# Patient Record
Sex: Male | Born: 1935 | Race: White | Hispanic: No | Marital: Married | Smoking: Former smoker
Health system: Southern US, Community
[De-identification: ages and names within clinical notes are randomized; demographics above are authoritative.]

## PROBLEM LIST (undated history)

## (undated) DIAGNOSIS — F419 Anxiety disorder, unspecified: Secondary | ICD-10-CM

## (undated) DIAGNOSIS — I25810 Atherosclerosis of coronary artery bypass graft(s) without angina pectoris: Secondary | ICD-10-CM

## (undated) DIAGNOSIS — I10 Essential (primary) hypertension: Secondary | ICD-10-CM

## (undated) DIAGNOSIS — K219 Gastro-esophageal reflux disease without esophagitis: Secondary | ICD-10-CM

## (undated) DIAGNOSIS — I4891 Unspecified atrial fibrillation: Secondary | ICD-10-CM

## (undated) DIAGNOSIS — E785 Hyperlipidemia, unspecified: Secondary | ICD-10-CM

## (undated) DIAGNOSIS — A0472 Enterocolitis due to Clostridium difficile, not specified as recurrent: Secondary | ICD-10-CM

## (undated) DIAGNOSIS — I6529 Occlusion and stenosis of unspecified carotid artery: Secondary | ICD-10-CM

## (undated) DIAGNOSIS — C44629 Squamous cell carcinoma of skin of left upper limb, including shoulder: Secondary | ICD-10-CM

## (undated) HISTORY — DX: Enterocolitis due to Clostridium difficile, not specified as recurrent: A04.72

## (undated) HISTORY — PX: SKIN CANCER EXCISION: SHX779

## (undated) HISTORY — DX: Gastro-esophageal reflux disease without esophagitis: K21.9

## (undated) HISTORY — DX: Hyperlipidemia, unspecified: E78.5

## (undated) HISTORY — PX: EYE SURGERY: SHX253

## (undated) HISTORY — DX: Occlusion and stenosis of unspecified carotid artery: I65.29

## (undated) HISTORY — DX: Anxiety disorder, unspecified: F41.9

## (undated) HISTORY — DX: Unspecified atrial fibrillation: I48.91

## (undated) HISTORY — PX: CORONARY ARTERY BYPASS GRAFT: SHX141

## (undated) HISTORY — DX: Essential (primary) hypertension: I10

## (undated) HISTORY — PX: CAROTID-SUBCLAVIAN BYPASS GRAFT: SHX910

---
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
## 2001-01-24 HISTORY — PX: CAROTID ENDARTERECTOMY: SUR193

## 2001-07-25 ENCOUNTER — Encounter: Payer: Self-pay | Admitting: Thoracic Surgery (Cardiothoracic Vascular Surgery)

## 2001-07-26 ENCOUNTER — Inpatient Hospital Stay (HOSPITAL_COMMUNITY): Admission: AD | Admit: 2001-07-26 | Discharge: 2001-07-31 | Payer: Self-pay | Admitting: Cardiovascular Disease

## 2001-07-26 ENCOUNTER — Encounter: Payer: Self-pay | Admitting: Thoracic Surgery (Cardiothoracic Vascular Surgery)

## 2001-07-27 ENCOUNTER — Encounter: Payer: Self-pay | Admitting: Thoracic Surgery (Cardiothoracic Vascular Surgery)

## 2001-07-28 ENCOUNTER — Encounter: Payer: Self-pay | Admitting: Thoracic Surgery (Cardiothoracic Vascular Surgery)

## 2001-10-25 ENCOUNTER — Ambulatory Visit (HOSPITAL_COMMUNITY): Admission: RE | Admit: 2001-10-25 | Discharge: 2001-10-25 | Payer: Self-pay | Admitting: Cardiovascular Disease

## 2002-01-29 ENCOUNTER — Encounter: Payer: Self-pay | Admitting: *Deleted

## 2002-01-31 ENCOUNTER — Ambulatory Visit (HOSPITAL_COMMUNITY): Admission: RE | Admit: 2002-01-31 | Discharge: 2002-01-31 | Payer: Self-pay | Admitting: *Deleted

## 2003-01-25 HISTORY — PX: COLONOSCOPY: SHX174

## 2003-01-25 LAB — HM COLONOSCOPY

## 2003-06-17 ENCOUNTER — Encounter (INDEPENDENT_AMBULATORY_CARE_PROVIDER_SITE_OTHER): Payer: Self-pay | Admitting: Specialist

## 2003-06-17 ENCOUNTER — Ambulatory Visit (HOSPITAL_COMMUNITY): Admission: RE | Admit: 2003-06-17 | Discharge: 2003-06-17 | Payer: Self-pay | Admitting: Gastroenterology

## 2005-04-11 ENCOUNTER — Ambulatory Visit: Payer: Self-pay | Admitting: Cardiovascular Disease

## 2006-06-05 ENCOUNTER — Ambulatory Visit: Payer: Self-pay | Admitting: *Deleted

## 2006-07-21 ENCOUNTER — Ambulatory Visit: Payer: Self-pay | Admitting: Cardiovascular Disease

## 2006-08-01 ENCOUNTER — Ambulatory Visit: Payer: Self-pay | Admitting: Cardiovascular Disease

## 2006-08-01 ENCOUNTER — Encounter (HOSPITAL_COMMUNITY): Admission: RE | Admit: 2006-08-01 | Discharge: 2006-08-31 | Payer: Self-pay | Admitting: Cardiovascular Disease

## 2006-12-04 ENCOUNTER — Ambulatory Visit: Payer: Self-pay | Admitting: *Deleted

## 2007-02-14 ENCOUNTER — Ambulatory Visit: Payer: Self-pay | Admitting: Cardiovascular Disease

## 2007-06-07 ENCOUNTER — Ambulatory Visit: Payer: Self-pay | Admitting: *Deleted

## 2007-07-05 ENCOUNTER — Ambulatory Visit: Payer: Self-pay | Admitting: *Deleted

## 2007-07-19 ENCOUNTER — Encounter: Admission: RE | Admit: 2007-07-19 | Discharge: 2007-07-19 | Payer: Self-pay | Admitting: *Deleted

## 2007-07-19 ENCOUNTER — Ambulatory Visit: Payer: Self-pay | Admitting: *Deleted

## 2007-07-24 ENCOUNTER — Ambulatory Visit: Payer: Self-pay | Admitting: *Deleted

## 2007-07-24 ENCOUNTER — Ambulatory Visit (HOSPITAL_COMMUNITY): Admission: RE | Admit: 2007-07-24 | Discharge: 2007-07-24 | Payer: Self-pay | Admitting: *Deleted

## 2007-08-21 ENCOUNTER — Encounter (INDEPENDENT_AMBULATORY_CARE_PROVIDER_SITE_OTHER): Payer: Self-pay | Admitting: *Deleted

## 2007-08-21 ENCOUNTER — Inpatient Hospital Stay (HOSPITAL_COMMUNITY): Admission: RE | Admit: 2007-08-21 | Discharge: 2007-08-22 | Payer: Self-pay | Admitting: *Deleted

## 2007-08-21 ENCOUNTER — Ambulatory Visit: Payer: Self-pay | Admitting: *Deleted

## 2007-09-20 ENCOUNTER — Ambulatory Visit: Payer: Self-pay | Admitting: *Deleted

## 2007-10-25 ENCOUNTER — Ambulatory Visit: Payer: Self-pay | Admitting: *Deleted

## 2007-11-26 ENCOUNTER — Inpatient Hospital Stay (HOSPITAL_COMMUNITY): Admission: RE | Admit: 2007-11-26 | Discharge: 2007-11-27 | Payer: Self-pay | Admitting: *Deleted

## 2007-11-26 ENCOUNTER — Ambulatory Visit: Payer: Self-pay | Admitting: *Deleted

## 2007-12-13 ENCOUNTER — Ambulatory Visit: Payer: Self-pay | Admitting: *Deleted

## 2008-01-11 ENCOUNTER — Encounter: Admission: RE | Admit: 2008-01-11 | Discharge: 2008-01-11 | Payer: Self-pay | Admitting: Family Medicine

## 2008-01-16 ENCOUNTER — Ambulatory Visit: Payer: Self-pay | Admitting: Cardiovascular Disease

## 2008-05-19 DIAGNOSIS — I25810 Atherosclerosis of coronary artery bypass graft(s) without angina pectoris: Secondary | ICD-10-CM

## 2008-05-19 DIAGNOSIS — I10 Essential (primary) hypertension: Secondary | ICD-10-CM | POA: Insufficient documentation

## 2008-05-19 DIAGNOSIS — E7800 Pure hypercholesterolemia, unspecified: Secondary | ICD-10-CM | POA: Insufficient documentation

## 2008-05-19 DIAGNOSIS — H538 Other visual disturbances: Secondary | ICD-10-CM

## 2008-05-19 DIAGNOSIS — G458 Other transient cerebral ischemic attacks and related syndromes: Secondary | ICD-10-CM | POA: Insufficient documentation

## 2008-05-19 DIAGNOSIS — G909 Disorder of the autonomic nervous system, unspecified: Secondary | ICD-10-CM | POA: Insufficient documentation

## 2008-05-19 HISTORY — DX: Atherosclerosis of coronary artery bypass graft(s) without angina pectoris: I25.810

## 2008-06-12 ENCOUNTER — Ambulatory Visit: Payer: Self-pay | Admitting: *Deleted

## 2008-06-25 ENCOUNTER — Ambulatory Visit (HOSPITAL_COMMUNITY): Admission: RE | Admit: 2008-06-25 | Discharge: 2008-06-25 | Payer: Self-pay | Admitting: *Deleted

## 2008-06-25 ENCOUNTER — Ambulatory Visit: Payer: Self-pay | Admitting: *Deleted

## 2008-07-17 ENCOUNTER — Ambulatory Visit: Payer: Self-pay | Admitting: *Deleted

## 2008-08-28 ENCOUNTER — Encounter: Payer: Self-pay | Admitting: Cardiovascular Disease

## 2009-04-28 ENCOUNTER — Ambulatory Visit: Payer: Self-pay | Admitting: Vascular Surgery

## 2009-05-26 ENCOUNTER — Encounter: Payer: Self-pay | Admitting: Cardiovascular Disease

## 2009-10-07 ENCOUNTER — Encounter: Payer: Self-pay | Admitting: Cardiovascular Disease

## 2010-01-06 ENCOUNTER — Ambulatory Visit: Payer: Self-pay | Admitting: Cardiovascular Disease

## 2010-01-06 DIAGNOSIS — R0989 Other specified symptoms and signs involving the circulatory and respiratory systems: Secondary | ICD-10-CM

## 2010-01-08 ENCOUNTER — Encounter: Payer: Self-pay | Admitting: Cardiology

## 2010-01-08 ENCOUNTER — Ambulatory Visit: Payer: Self-pay

## 2010-01-08 ENCOUNTER — Encounter: Payer: Self-pay | Admitting: Cardiovascular Disease

## 2010-01-08 ENCOUNTER — Ambulatory Visit: Payer: Self-pay | Admitting: Vascular Surgery

## 2010-02-02 ENCOUNTER — Encounter: Payer: Self-pay | Admitting: Cardiovascular Disease

## 2010-02-03 ENCOUNTER — Encounter: Payer: Self-pay | Admitting: Cardiovascular Disease

## 2010-02-24 ENCOUNTER — Other Ambulatory Visit: Payer: Self-pay | Admitting: Family Medicine

## 2010-02-25 ENCOUNTER — Other Ambulatory Visit (HOSPITAL_COMMUNITY): Payer: Self-pay

## 2010-02-25 NOTE — Assessment & Plan Note (Signed)
Summary: F1Y   Visit Type:  Follow-up  CC:  1 yr fu .  History of Present Illness: Danny [name] is seen today for distant CABG, 2003  carotid disease with left CEA and right subclavian bypass.  in 2009  He has HTN and elevated lipids.  last visit he had had a syncopal episode.  There was no cardiac etiology found.  He has great exercise tolerance with no angina.  He has improved strength in his right arm and no TIA symptoms.  He has residual right carotid disease.  He was scheduled to have a duplex at [facility] Friday and F/U with a new Dr Imogene Burn.  I told him I prefer to follow his carotid disease both exam and duplex in our office since Dr Madilyn Fireman is gone.  I personally called and spole with "Revonda Standard" to cancel his appt.  CRF's are well modified.  Reviewed lipids from [place] and they are excellent on statin.  Discussed low chol/carb/Na diet.   Compliant with meds  Current Problems (verified): 1)  Subclavian Steal Syndrome  (ICD-435.2) 2)  Blurred Vision  (ICD-368.8) 3)  Cad, Artery Bypass Graft  (ICD-414.04) 4)  Syncope-carotid Sinus  (ICD-337) 5)  Hypertension, Unspecified  (ICD-401.9) 6)  Hypercholesterolemia Iia  (ICD-272.0)  Current Medications (verified): 1)  Lopressor 50 Mg Tabs (Metoprolol Tartrate) .... Take 1/2 Tab Two Times A Day 2)  Lovastatin 40 Mg Tabs (Lovastatin) .... Take 1 Tab Daily 3)  Niacin Cr 1000 Mg Cr-Tabs (Niacin) .... Take 1 Tab Daily 4)  Diovan 80 Mg Tabs (Valsartan) .... Take 1 Tab Dialy 5)  Aspirin 325 Mg Tabs (Aspirin) .... Take 1 Tab Daily 6)  Ra Fish Oil 1000 Mg Caps (Omega-3 Fatty Acids) .... Take 1 Tab Daily 7)  Vitamin D 2000 Unit Tabs (Cholecalciferol) .... Take 1 Tab Daily 8)  Daily-Vitamin  Tabs (Multiple Vitamin) .... Take 1 Tab Dialy  Allergies (verified): No Known Drug Allergies  Past History:  Past Medical History: Last updated: 05/19/2008 CAD, ARTERY BYPASS GRAFT (ICD-414.04) SYNCOPE-CAROTID SINUS (ICD-337) HYPERTENSION, UNSPECIFIED  (ICD-401.9) HYPERCHOLESTEROLEMIA  IIA (ICD-272.0)    Past Surgical History: Last updated: 05/19/2008 Rght subclavian bypass 11/26/2007 Madilyn Fireman left carotid endarterectomy 08/19/2007 Madilyn Fireman coronary artery bypass:  [id number] [facility]   Median sternotomy for coronary artery bypass grafting x3 (left internal mammary artery to distal left anterior descending coronary artery, right internal mammary artery to ramus intermediate branch, saphenous vein graft to third circumflex marginal branch).  Family History: Last updated: 05/19/2008 non-contributory  Social History: Last updated: 05/19/2008 Retired  Part Time  Married  Tobacco Use - No.  Alcohol Use - no  Review of Systems       Denies fever, malais, weight loss, blurry vision, decreased visual acuity, cough, sputum, SOB, hemoptysis, pleuritic pain, palpitaitons, heartburn, abdominal pain, melena, lower extremity edema, claudication, or rash. '  Vital Signs:  Patient profile:   77 year old male Height:      74 inches Weight:      177 pounds BMI:     22.81 O2 Sat:      96 % on Room air Pulse rate:   61 / minute BP sitting:   156 / 76  (right arm)  Vitals Entered By: Dreama Saa, CNA (January 06, 2010 9:52 AM)  O2 Flow:  Room air  Physical Exam  General:  Affect appropriate Healthy:  appears stated age HEENT: normal Neck supple with no adenopathy JVP normal bilateral  bruits no thyromegaly Lungs clear with no wheezing  and good diaphragmatic motion Heart:  S1/S2 no murmur,rub, gallop or click PMI normal Abdomen: benighn, BS positve, no tenderness, no AAA no bruit.  No HSM or HJR Distal pulses intact with no bruits No edema Neuro non-focal Skin warm and dry    Impression & Recommendations:  Problem # 1:  SUBCLAVIAN STEAL SYNDROME (ICD-435.2) S/P bypass with good strength, radial pulse.   His updated medication list for this problem includes:    Aspirin 325 Mg Tabs (Aspirin) .[name]... Take 1 tab daily  Problem # 2:   CAROTID BRUIT (ICD-785.9) F/U duplex this Friday  Vascular care transferred to [place] as Dr Cidra Bing is no longer in [place].  Continue ASA  Called [facility] office personally  Problem # 3:  CAD, ARTERY BYPASS GRAFT (ICD-414.04) CABG 2003  no angina and actice Continue ASA and BB His updated medication list for this problem includes:    Lopressor 50 Mg Tabs (Metoprolol tartrate) .[name]... Take 1/2 tab two times a day    Aspirin 325 Mg Tabs (Aspirin) .[name]... Take 1 tab daily  Orders: Carotid Duplex (Carotid Duplex)  Problem # 4:  HYPERTENSION, UNSPECIFIED (ICD-401.9) Well controlled and equal in both arms since subclavian bypass His updated medication list for this problem includes:    Lopressor 50 Mg Tabs (Metoprolol tartrate) .[name]... Take 1/2 tab two times a day    Diovan 80 Mg Tabs (Valsartan) .[name]... Take 1 tab dialy    Aspirin 325 Mg Tabs (Aspirin) .[name]... Take 1 tab daily  Problem # 5:  HYPERCHOLESTEROLEMIA  IIA (ICD-272.0) Reviewed labs from [place].  Continue 2 drug Rx His updated medication list for this problem includes:    Lovastatin 40 Mg Tabs (Lovastatin) .[name]... Take 1 tab daily    Niacin Cr 1000 Mg Cr-tabs (Niacin) .[name]... Take 1 tab daily  Patient Instructions: 1)  Your physician recommends that you schedule a follow-up appointment in: 6 MONTHS 2)  Your physician has requested that you have a carotid duplex. This test is an ultrasound of the carotid arteries in your neck. It looks at blood flow through these arteries that supply the brain with blood. Allow one hour for this exam. There are no restrictions or special instructions. IN [place]

## 2010-02-25 NOTE — Miscellaneous (Signed)
Summary: Orders Update  Clinical Lists Changes  Orders: Added new Test order of Carotid Duplex (Carotid Duplex) - Signed 

## 2010-02-26 ENCOUNTER — Ambulatory Visit (HOSPITAL_COMMUNITY)
Admission: RE | Admit: 2010-02-26 | Discharge: 2010-02-26 | Disposition: A | Discharge status: Home / Self Care | Payer: Medicare HMO | Source: Ambulatory Visit | Attending: Family Medicine | Admitting: Family Medicine

## 2010-02-26 DIAGNOSIS — R109 Unspecified abdominal pain: Secondary | ICD-10-CM | POA: Insufficient documentation

## 2010-02-26 DIAGNOSIS — I10 Essential (primary) hypertension: Secondary | ICD-10-CM | POA: Insufficient documentation

## 2010-05-03 LAB — CBC
Platelets: 252 10*3/uL (ref 150–400)
WBC: 6.8 10*3/uL (ref 4.0–10.5)

## 2010-05-03 LAB — POCT I-STAT, CHEM 8
Calcium, Ion: 1.23 mmol/L (ref 1.12–1.32)
HCT: 44 % (ref 39.0–52.0)
TCO2: 28 mmol/L (ref 0–100)

## 2010-05-06 ENCOUNTER — Encounter: Payer: Self-pay | Admitting: Cardiovascular Disease

## 2010-05-18 ENCOUNTER — Encounter: Payer: Self-pay | Admitting: Family Medicine

## 2010-05-18 DIAGNOSIS — G4700 Insomnia, unspecified: Secondary | ICD-10-CM

## 2010-05-18 DIAGNOSIS — R0989 Other specified symptoms and signs involving the circulatory and respiratory systems: Secondary | ICD-10-CM

## 2010-05-18 DIAGNOSIS — I6529 Occlusion and stenosis of unspecified carotid artery: Secondary | ICD-10-CM | POA: Insufficient documentation

## 2010-05-18 DIAGNOSIS — I714 Abdominal aortic aneurysm, without rupture: Secondary | ICD-10-CM | POA: Insufficient documentation

## 2010-05-18 DIAGNOSIS — N400 Enlarged prostate without lower urinary tract symptoms: Secondary | ICD-10-CM | POA: Insufficient documentation

## 2010-05-18 DIAGNOSIS — E785 Hyperlipidemia, unspecified: Secondary | ICD-10-CM

## 2010-05-18 DIAGNOSIS — J029 Acute pharyngitis, unspecified: Secondary | ICD-10-CM

## 2010-05-18 NOTE — Patient Instructions (Addendum)
Wait for throat culture to come back Cont meds as doing

## 2010-05-18 NOTE — Progress Notes (Unsigned)
  Subjective:    Patient ID: Danny [name], male    DOB: [birth year], 77 y.o.   MRN: [id number]  HPI Sore throat,slight cough,increased  Bp, and increased cholesterol    Review of Systems  Constitutional: Negative.  Negative for activity change and appetite change.  HENT: Positive for ear pain, sore throat, trouble swallowing and neck pain.   Eyes: Negative.   Respiratory: Positive for cough.   Cardiovascular: Negative.   Gastrointestinal: Negative.   Genitourinary: Negative.   Neurological: Negative.   Hematological: Negative.   Psychiatric/Behavioral: Negative.        Objective:   Physical Exam  Constitutional: He is oriented to person, place, and time. He appears well-developed and well-nourished.  HENT:  Head: Normocephalic and atraumatic.  Right Ear: External ear normal.  Left Ear: External ear normal.  Nose: Nose normal.  Mouth/Throat: Oropharynx is clear and moist.  Eyes: Conjunctivae are normal.  Neck: Trachea normal and normal range of motion. Neck supple. No JVD present. Carotid bruit is present. No tracheal deviation present. No mass and no thyromegaly present.    Cardiovascular: Normal rate, regular rhythm and normal heart sounds.  Exam reveals no gallop and no friction rub.   No murmur heard. Pulmonary/Chest: Effort normal. No respiratory distress. He has no rales.  Abdominal: Soft. Bowel sounds are normal. He exhibits no mass. There is no tenderness.  Musculoskeletal: Normal range of motion.  Neurological: He is alert and oriented to person, place, and time. He exhibits abnormal muscle tone.  Skin: Skin is warm and dry.  Psychiatric: His behavior is normal. Thought content normal.          Assessment & Plan:

## 2010-05-19 ENCOUNTER — Encounter: Payer: Self-pay | Admitting: Cardiovascular Disease

## 2010-06-08 NOTE — Assessment & Plan Note (Signed)
OFFICE VISIT   Danny [name], Danny [name]  DOB:  10/06/1931                                       07/05/2007  [id number]#:[id number]   The patient returns today for continued followup of his known right  subclavian occlusion with mild right upper extremity claudication and no  symptoms of significant posterior circulation ischemia.   He continues to complain of some mild right upper extremity claudication  symptoms.  He has occasional dizziness when he looks upward, this is  mild and not particularly disabling.   He did undergo a carotid duplex evaluation on 06/07/2007 and this  revealed retrograde right vertebral flow and antegrade left vertebral  flow.  Monophasic right brachial waveform and triphasic left brachial  waveform.  Of significance he does have elevated velocities in his left  internal carotid artery now measuring 514/104 cm/sec with mixed calcific  plaque.  There is also disease noted at the right bifurcation with ICA  velocities of 338/60 cm/sec.   The patient has had no anterior circulation symptoms.  Specifically he  denies visual disturbance, sensory or motor deficit.  No speech  problems.  No syncope or presyncope.   MEDICATIONS:  He remains on aspirin 325 mg daily and Advicor 1000/40  once daily.  He also takes an additional medication he is unable to  recall.   ALLERGIES:  He has no known allergies.   PHYSICAL EXAMINATION:  General:  On evaluation today the patient appears  well.  He is 77 years of age, alert and oriented, no acute distress.  Vital signs:  BP is 133/74, pulse 54 per minute.  Neck:  Harsh left  carotid bruit, soft right carotid bruit.  2+ left brachial, radial and  ulnar pulses.  Absent right brachial, radial and ulnar pulses.  Heart:  Normal heart sounds without murmurs.  Chest:  Is clear with equal entry  bilaterally.  Neurological:  Cranial nerves intact.  Strength equal  bilaterally.  2+ reflexes.   The patient shows  evidence of further progression of his left carotid  stenosis.  These velocities are concerning for severe stenosis.  I have  ordered a CT angiogram of the neck to be performed and return visit to  see me with consideration for possible left carotid endarterectomy.   Balinda Quails, M.D.  Electronically Signed   PGH/MEDQ  D:  07/05/2007  T:  07/06/2007  Job:  [id number]

## 2010-06-08 NOTE — Assessment & Plan Note (Signed)
OFFICE VISIT   Danny [name], Danny [name]  DOB:  16-May-1931                                       12/13/2007  [id number]#:[id number]   The patient underwent right carotid subclavian bypass 11/26/2007 at  [facility].   He did have some urinary retention postoperatively, however, required no  treatment for this.  Has been doing fairly well since discharge.  He,  however, did have an episode of syncope following his discharge when he  fell and struck his left elbow.  Denied any lightheadedness.  He has  some occasional dizziness.   His blood pressure is 119/70, pulse is 65 per minute.  His right radial  pulse is 2+.  Right neck incision is healing well.   He does have a significant effusion in his left olecranon bursa and this  was aspirated for a small amount of thick synovial fluid today.  No  evidence of infection.   The patient will follow up with me in 6 months with carotid Doppler and  follow-up evaluation of his right carotid subclavian bypass.  Should he  have any further episodes of syncope or presyncope, I have asked him to  contact me as he may require further workup through Dr. Eden Emms.   Balinda Quails, M.D.  Electronically Signed   PGH/MEDQ  D:  12/13/2007  T:  12/14/2007  Job:  [id number]   cc:   Noralyn Pick. Eden Emms, MD, [facility]  Ernestina Penna, M.D.

## 2010-06-08 NOTE — Assessment & Plan Note (Signed)
OFFICE VISIT   Danny [name], Danny [name]  DOB:  [birth year]                                       09/20/2007  [id number]#:[id number]   The patient underwent a left carotid endarterectomy for severe  progressive stenosis 08/21/2007.  He presents to the office at this time  for followup.  His left neck incision is well-healed.  He has no  complaints.  Cranial nerves intact.  Strength is equal bilaterally.  BP  is 115/54, pulse is 52 per minute.   Overall the patient is doing well.  I will plan to follow up with him  again in approximately 4-6 weeks and at that time consider a right  carotid subclavian bypass for reverse vascularization of his posterior  circulation.   Balinda Quails, M.D.  Electronically Signed   PGH/MEDQ  D:  09/20/2007  T:  09/21/2007  Job:  [id number]   cc:   Noralyn Pick. Eden Emms, MD, [facility]  Ernestina Penna, M.D.

## 2010-06-08 NOTE — Assessment & Plan Note (Signed)
OFFICE VISIT   Danny [name], Danny [name]  DOB:  08-Aug-1931                                       10/25/2007  [id number]#:[id number]   The patient underwent left carotid endarterectomy for severe progressive  stenosis 08/21/2007.  He has recovered well from this surgery.  No  complicating events.  Left neck incision now well-healed.   Arteriography on the patient did reveal occlusion of his left vertebral  artery and right subclavian steal.  He does continue to complain of some  positional dizziness especially when looking upward.   Taking into account the at risk posterior circulation I do think it is  worthwhile to go ahead now and do a right carotid subclavian bypass for  revascularization of the posterior circulation.  He clearly is  symptomatic.  This is scheduled for 11/26/2007 at [facility].   Balinda Quails, M.D.  Electronically Signed   PGH/MEDQ  D:  10/25/2007  T:  10/26/2007  Job:  [id number]   cc:   Noralyn Pick. Eden Emms, MD, [facility]  Ernestina Penna, M.D.

## 2010-06-08 NOTE — Op Note (Signed)
Danny [name], Danny [name] NO.:  [id number]   MEDICAL RECORD NO.:  [id number]          PATIENT TYPE:  AMB   LOCATION:  SDS                          FACILITY:  [facility]   PHYSICIAN:  Balinda Quails, M.D.    DATE OF BIRTH:  08-Apr-1931   DATE OF PROCEDURE:  07/24/2007  DATE OF DISCHARGE:  07/24/2007                               OPERATIVE REPORT   DIAGNOSES:  Diffuse extracranial cerebrovascular disease.   PROCEDURES:  1. Arch aortogram.  2. Innominate arteriogram.  3. Bilateral selective carotid arteriograms.  4. Left subclavian arteriogram.   ACCESS:  Right common femoral artery 5-French sheath.   CONTRAST:  100 mL Visipaque.   COMPLICATIONS:  None apparent.   CLINICAL NOTE:  Danny [name] is a 77 year old gentleman followed in the  office with known extracranial cerebrovascular disease.  Recent Dopplers  of his carotids revealed progression of the left internal carotid artery  stenosis.  This was severe by Doppler evaluation; however, by CT  angiography, this was estimated to be less severe.  He is therefore  brought to the cath lab at this time for workup with the cerebral  arteriography to further delineate in detail the extent of his  cerebrovascular disease.   PROCEDURE NOTE:  The patient placed in supine position.  Both groins  were prepped and draped in a sterile fashion.  Skin and subcutaneous  tissue and right groin were instilled with 1% Xylocaine.  An 18-gauge  needle was introduced in the right common femoral artery.  A 0.035  Wholey guidewire advanced through the needle into the mid abdominal  aorta.  The needle removed.  5-French sheath advanced over the  guidewire, dilator removed and sheath flushed with heparin saline  solution.  Standard pigtail catheter was then advanced over guidewire.  The guidewire and pigtail catheter advanced into the aortic arch.  30-  degree projection LAO arch aortogram obtained.  This revealed distinct  origin of the  innominate, left common carotid, and left subclavian  arteries.  The right subclavian artery origin appeared to be occluded  with retrograde flow noted in the right vertebral artery reconstituting  the distal right subclavian artery.  The proximal right common carotid  and left common carotid arteries were widely patent.  The left  subclavian artery was widely patent.  There was altered flow noted in  the left vertebral artery.   Guidewire reinserted and a JB1 catheter advanced over guidewire.  The  JB1 catheter engaged in the innominate artery.  An innominate  arteriogram obtained.  This revealed the innominate artery to be widely  patent.  The right subclavian artery to be occluded at its origin with  reconstitution via retrograde flow of the right vertebral artery.  The  right common carotid artery was widely patent at its origin.   The guidewire was then reinserted and the JB1 catheter advanced into the  right common carotid artery.  The right carotid arteriography obtained.  The right carotid bifurcation did reveal posterior plaque with  approximately 50% right internal carotid artery stenosis.  Antegrade  flow present to  the base of the skull.  Intracranial views will be  interpreted under neuroradiology interpretation.   The JB1 catheter was then disengaged from the right common carotid  artery and engaged in the left common carotid artery.  The left carotid  arteriography obtained.  This revealed severe plaque at the left carotid  bifurcation with a high-grade stenosis noted in the left internal  carotid artery estimated to be greater than 80%.  The stenosis was quite  focal and the distal left internal carotid artery to the base of the  skull was patent.   The JB1 catheter was then disengaged from the left common carotid and  engaged into the left subclavian artery.  The left subclavian  arteriography obtained.  The left subclavian artery was patent.  There  was, however, an  occlusion of the left vertebral artery at its origin  with collateral flow reconstituting the left vertebral artery at the  level of the mid cervical area.   This completed the arteriogram procedure.  No apparent complications.  The catheter was removed and the right femoral sheath removed.   FINAL IMPRESSION:  1. Right subclavian occlusion with retrograde right vertebral flow      consistent with subclavian steal.  2. Moderate 50% stenosis, right internal carotid artery.  3. Severe stenosis left internal carotid artery, estimated to be      greater than 80%.  4. 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   DISPOSITION:  These results have been reviewed with the patient and  family.  The patient will be electively scheduled for a left carotid  endarterectomy for initial treatment of a severe left internal carotid  artery stenosis.      Balinda Quails, M.D.  Electronically Signed     PGH/MEDQ  D:  07/25/2007  T:  07/26/2007  Job:  [id number]   cc:   Noralyn Pick. Eden Emms, MD, [facility]

## 2010-06-08 NOTE — Assessment & Plan Note (Signed)
OFFICE VISIT   Danny [name], Danny [name]  DOB:  1931/11/14                                       07/17/2008  [id number]#:[id number]   The patient underwent a diagnostic arteriogram and angioplasty of the  right carotid subclavian bypass on 06/25/2008 at [facility].  There were no periprocedure complications.  He was discharged home in  good condition.  He is doing well at present.  Only notes dizziness when  he takes Diovan.   He has 2+ radial pulses bilaterally.  Blood pressure is reduced in the  right arm at 114/59, pulse is 58 per minute.  Left arm 145/69.  Right  groin site reveals no complications.   The patient is doing well following his recent interventional procedure.  We will plan followup per protocol with Doppler evaluation in 6 months.   Balinda Quails, M.D.  Electronically Signed   PGH/MEDQ  D:  07/17/2008  T:  07/18/2008  Job:  [id number]   cc:   Ernestina Penna, M.D.  Noralyn Pick. Eden Emms, MD, [facility]

## 2010-06-08 NOTE — Discharge Summary (Signed)
NAMEKOURY, RODDY NO.:  [id number]   MEDICAL RECORD NO.:  [id number]          PATIENT TYPE:  INP   LOCATION:  3305                         FACILITY:  [facility]   PHYSICIAN:  Balinda Quails, M.D.    DATE OF BIRTH:  Jan 21, 1932   DATE OF ADMISSION:  08/21/2007  DATE OF DISCHARGE:  08/22/2007                               DISCHARGE SUMMARY   ADMISSION DIAGNOSIS:  Severe progressive left internal carotid artery  stenosis, asymptomatic.   DISCHARGE/SECONDARY DIAGNOSES:  1. Severe progressive left internal carotid artery stenosis,      asymptomatic, status post left carotid endarterectomy.  2. History of right subclavian steal with occlusion of right      subclavian artery.  3. Moderate stenosis of his right internal carotid artery.  4. Coronary artery disease, status post coronary artery bypass      grafting.  5. Hyperlipidemia.  6. 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   ALLERGIES:  MORPHINE and CONTRAST MEDIA.   PROCEDURES:  On August 21, 2007, left carotid endarterectomy with Dacron  patch angioplasty by Dr. Denman Wilbur.   BRIEF HISTORY:  Mr. Yepiz is a 77 year old Caucasian male followed for  several years with known extracranial cerebrovascular occlusive disease.  Recently, he showed evidence of progressive left internal carotid artery  stenosis.  Arteriography verified these findings with occlusion of the  left vertebral artery and right subclavian occlusion.  He was brought  and Dr. Madilyn Fireman recommended that he proceed with left carotid  endarterectomy to reduce his risk for future stroke.   HOSPITAL COURSE:  Mr. Mccauley was electively admitted to [facility] on August 21, 2007, and underwent the previously mentioned  procedure.  He was extubated neurologically intact and after short-stay  in recovery he was transferred to Step-down Unit 3300, where he has  remained until discharge.  At the time of dictation, he has had an  uneventful  postoperative course.  Postoperative labs were stable showing  white count of 10.3, hemoglobin 13.5, hematocrit 39, platelet count 222,  sodium 136, potassium 3.8, BUN 5, creatinine 0.66, and blood glucose of  96.  He did have some nausea and headache overnight, which had both  improved by the morning of postoperative day #1.  He was able to  ambulate, tolerate a regular breakfast, and void following Foley  catheter removal.  Pain was currently controlled on oral medication.  He  denies dysphagia.  His neck is healing well without signs of hematoma.  Neurologically, he remains intact.  Tongue is midline.  He is moving all  extremities x4 and symmetrically.  Vitals have been stable.  Blood  pressure was slightly hypertensive at 146/51.  He has been restarted on  his home antihypertensive medication.  He has maintained sinus rhythm  and oxygen saturations are 100% on room air.  Subsequently, Mr. Vickrey was  felt appropriate for discharge home on postoperative day #1, August 22, 2007.  Currently, he remains in stable condition.   DISCHARGE MEDICATIONS:  1. Oxycodone 5 mg 1-2 tablets p.o. q.4h. p.r.n. pain.  2.  Lopressor 50 mg one-half tablet b.i.d.  3. Advicor 1000/40 mg daily.  4. Diovan 80 mg daily.  5. Coated aspirin 325 mg daily.  6. Fish oil capsule 1000 two daily.  7. Multivitamin daily.  8. Vitamin D3 two tablets daily.   DISCHARGE INSTRUCTIONS:  He will continue a heart-healthy diet.  Increase activity slowly, shower.  Clean incisions gently with soap and  water.  Avoid driving or heavy lifting for the next 2 weeks.  Call if he  develops fever greater than 101, redness or drainage from his incision  site, or neurologic changes.  He will see Dr. Madilyn Fireman in 2-3 weeks.  Our  office will contact him regarding specific appointment date and time.      Jerold Coombe, P.A.      Balinda Quails, M.D.  Electronically Signed    AWZ/MEDQ  D:  08/22/2007  T:  08/22/2007  Job:   [id number]   cc:   Noralyn Pick. Eden Emms, MD, [facility]  Ernestina Penna, M.D.

## 2010-06-08 NOTE — H&P (Signed)
NAMEBILLIE, [name] NO.:  [id number]   MEDICAL RECORD NO.:  [id number]          PATIENT TYPE:  INP   LOCATION:  3306                         FACILITY:  [facility]   PHYSICIAN:  Balinda Quails, M.D.    DATE OF BIRTH:  [birth year]   DATE OF ADMISSION:  11/26/2007  DATE OF DISCHARGE:                              HISTORY & PHYSICAL   CARDIOLOGIST:  Theron Arista C. Eden Emms, MD, [facility]   PRIMARY CARE PHYSICIAN:  Ernestina Penna, MD   ADMISSION DIAGNOSIS:  Right subclavian steal.   HISTORY:  Danny [name] is a 77 year old gentleman with known extracranial  cerebrovascular occlusive disease.  He has had a right subclavian  occlusion with right subclavian steal for sometime.  He underwent a left  carotid endarterectomy for severe stenosis in July 2009.  He, however,  has continued to have symptoms of lightheadedness associated with right  arm use and right arm fatigue.   Arteriography has previously documented well a left vertebral occlusion,  right subclavian occlusion and retrograde right vertebral flow.   The patient admitted at this time for placement of right carotid  subclavian bypass for relief of symptomatic right subclavian steal.   PAST MEDICAL HISTORY:  1. Coronary artery disease status post coronary artery bypass.  2. Hyperlipidemia.  3. Status post left carotid endarterectomy.   MEDICATIONS:  1. Lopressor 25 mg b.i.d.  2. Advicor 1000/40 one tablet daily.  3. Diovan 80 mg daily.  4. Aspirin 325 mg daily.  5. Fish oil 1000 mg b.i.d.  6. Multivitamin 1 tablet daily.  7. Vitamin D3 two tablets daily.   ALLERGIES:  MORPHINE and CONTRAST DYE.   FAMILY HISTORY:  Noncontributory.   REVIEW OF SYSTEMS:  The patient denies any symptoms of syncope.  He  does, however, have presyncope.  No recent chest pain or shortness of  breath.  No cough or sputum production.  No weight loss.  Normal bowel  habits.  No dysuria or frequency.   SOCIAL HISTORY:  The patient lives in  the [place].  He is semi-retired.  He is married.  No recent alcohol or tobacco use.   PHYSICAL EXAMINATION:  GENERAL:  Well-appearing 77 year old male.  Alert  and oriented, in no acute distress.  VITAL SIGNS:  Temperature 97.9, pulse 72 per minute, respirations 18 per  minute, BP 171/85, and CO2 is 100% on room air.  HEENT:  Unremarkable.  NECK:  Supple.  No thyromegaly or adenopathy.  CHEST:  Equal air entry bilaterally without rales or rhonchi.  CARDIOVASCULAR:  No carotid bruits.  Heart sounds are normal without  murmurs.  No gallops or rubs.  Regular rate and rhythm.  ABDOMEN:  Soft and nontender.  No masses or organomegaly.  Bowel sounds  are active, no bruits.  EXTREMITIES:  Full range of motion.  No ankle edema.  2+ femoral pulses.  NEUROLOGIC:  Cranial nerves intact.  Strength equal bilaterally.  1+  reflexes.  Normal gait.  SKIN:  Intact without rales or ulceration.   FINAL DIAGNOSES:  1. Right subclavian steal.  2. Coronary artery disease.  3. Hyperlipidemia.  4. History of left carotid endarterectomy.   DISPOSITION:  The patient will be admitted electively for planned right  carotid subclavian bypass for relief of symptomatic right subclavian  steal.      P. Liliane Bade, M.D.  Electronically Signed     PGH/MEDQ  D:  11/26/2007  T:  11/26/2007  Job:  [id number]   cc:   Noralyn Pick. Eden Emms, MD, [facility]  Ernestina Penna, M.D.

## 2010-06-08 NOTE — Op Note (Signed)
NAMEDERREL, MOORE NO.:  [id number]   MEDICAL RECORD NO.:  [id number]          PATIENT TYPE:  AMB   LOCATION:  SDS                          FACILITY:  [facility]   PHYSICIAN:  Balinda Quails, M.D.    DATE OF BIRTH:  1931/09/21   DATE OF PROCEDURE:  06/25/2008  DATE OF DISCHARGE:  06/25/2008                               OPERATIVE REPORT   PHYSICIAN:  Balinda Quails, MD   DIAGNOSES:  Stenosis, right carotid subclavian bypass.   PROCEDURE:  1. Arch aortogram.  2. Selective right carotid arteriogram.  3. Percutaneous transluminal angioplasty, right carotid subclavian      bypass.  4. Access, right common femoral artery 6-French sheath.   CONTRAST:  160 mL Visipaque.   COMPLICATIONS:  None apparent.   CLINICAL NOTE:  Danny [name] is a 77 year old male who underwent a right  carotid subclavian bypass for significant symptomatic right subclavian  steal in November of last year.  Follow up duplex evaluation revealed a  high-grade stenosis of the subclavian anastomosis, and to and fro flow  noted in the right vertebral artery.  The patient was describing new  onset of symptoms of lightheadedness.  He was brought to cath lab at  this time for arteriography and possible intervention.   PROCEDURE NOTE:  The patient was brought to the cath lab in stable  condition.  Placed in supine position.  Right groin prepped and draped  in sterile fashion.  Skin and subcutaneous tissue was instilled with 1%  Xylocaine.  An 18-gauge needle induced in right common femoral artery.  0.035 versacore guidewire advanced through the needle into the mid  abdominal aorta.  Dissected with 11 blade.  A 5-French sheath advanced  over guidewire.  The dilator removed, sheath flushed with heparin saline  solution.  A long pigtail catheter was then advanced over the guidewire  and both were advanced into the ascending aorta.  A 40-degree LAO arch  aortogram obtained.  This revealed patent innominate  left carotid and  left subclavian arteries proximally.  The left common carotid artery was  patent.  The left carotid subclavian bypass was intact.  There was an  occlusion of the proximal left subclavian artery.  Retrograde flow  present in the proximal left subclavian artery back to the left  vertebral artery.  The carotid subclavian bypass did, however, reveal  evidence of stenosis at the distal anastomosis.   The guidewire was reinserted and H1 catheter was then exchanged over the  guidewire and this was advanced into the right common carotid artery.  Right carotid arteriography obtained with anterolateral cervical and  intracranial projections.  The carotid bifurcation revealed  approximately 50% proximal right ICA stenosis.  The right carotid  subclavian bypass was patent, however, there was a high-grade stenosis  noted at the distal anastomosis to the subclavian artery.   The guidewire then reinserted and the H1 catheter engaged into the  carotid subclavian bypass, the guidewire advanced across the carotid  subclavian bypass into the right subclavian artery.  The patient  administered a total of 6000  units of heparin intravenously.  The H1  catheter then removed and a end-hole catheter advanced over the  guidewire into the right subclavian artery.  A guidewire exchange was  then made for a long Rosen guidewire.  The catheter removed and a 6-  [place] shuttle sheath was advanced over the [place] guidewire into the  right common carotid artery.   A 4 x 40 Synergy balloon was then advanced over the guidewire and across  the distal anastomosis and inflated at 12 atmospheres x30 seconds.  At  completion of this, an arteriogram obtained.  There was residual  stenosis and therefore an exchange was made for a 5 x 20 synergy  balloon, which was inflated at 12 atmospheres for 30 seconds.  A  completion arteriogram then once again obtained, this revealed  significant improvement in the  distal stenosis, which was not estimated  to be mild to moderate.  No further interventions were undertaken.  The  end-hole catheter reinserted and the Rosen guidewire removed.  The  sheath was removed and exchanged for a short 6-French sheath in the  right groin.  The patient was transferred to the recovery room in stable  condition.  No apparent complications.   FINAL IMPRESSION:  1. Severe right carotid subclavian bypass stenosis at the subclavian      anastomosis.  2. Successful balloon dilatation of the carotid subclavian anastomotic      stricture.  3. No apparent complications.   DISPOSITION:  These results have been reviewed with the patient and  family.  He will be followed up in the office in 3-4 weeks after plan  discharge today.      Balinda Quails, M.D.  Electronically Signed     PGH/MEDQ  D:  06/25/2008  T:  06/25/2008  Job:  [id number]   cc:   Noralyn Pick. Eden Emms, MD, [facility]

## 2010-06-08 NOTE — Assessment & Plan Note (Signed)
[facility]                            CARDIOLOGY OFFICE NOTE   JAVAUGHN, OPDAHL                        MRN:          [id number]  DATE:07/21/2006                            DOB:          09/24/1931    Mr. Cadavid is a gentleman who is followed by Dr. Eden Emms with a history of  coronary artery bypass and graft in 2003.  He also has right subclavian  stenosis and cerebrovascular disease that is being followed by Dr.  Madilyn Fireman.  He was recently seen by Dr. Christell Constant, and an EKG was felt to be  abnormal.  He returns for followup here today.  Note, he is not having  chest pain with exertion, dyspnea, palpitations, or syncope, and there  is no pedal edema.  He has some discomfort in his right upper extremity  when he lifts it over his head for an extended amount of time, but  otherwise is doing well.   MEDICATIONS:  1. Aspirin 325 mg p.o. daily.  2. Multivitamin 1 p.o. daily.  3. Zetia 10 mg p.o. daily.  4. Advicor 1000/40 daily.  5. Fish oil.   PHYSICAL EXAMINATION:  Blood pressure of 143/63, and his pulse is 63.  He weighs 176 pounds.  His HEENT is normal.  His neck is supple with a loud, left carotid bruit and softer right  carotid bruit.  His chest is clear.  His cardiovascular exam reveals a regular rate and rhythm.  His abdominal exam is benign with no pulsatile masses, no bruits.  His extremities show no edema.   Review of his electrocardiogram from Dr. Kathi Der office reveals a sinus  rhythm at a rate of 47.  The axis is normal.  A prior septal infarct  could not be excluded.  There is J-point elevation in V3 and V4.  Note,  there is evidence of this on previous electrocardiograms.   His most recent lipids show an LDL of 70 and an HDL of 55.  His liver  functions were normal.   DIAGNOSES:  1. Coronary artery disease status post Coronary artery bypass and      graft:  The patient is not having any symptoms at present.      However, it has been 5  years since his previous bypass and we have      scheduled a stress Myoview for risk stratification.  If it shows      normal perfusion, we will continue with medical therapy.  He will      continue on his aspirin, statin, and Zetia.  2. Subclavian stenosis:  This is being followed by Dr. Madilyn Fireman and does      not appear to be extremely symptomatic.  Medical therapy is most      likely warranted.  3. Cerebrovascular disease:  He had recent carotid Dopplers performed      by Dr. Madilyn Fireman and this is being followed by cardiovascular thoracic      surgery as well.  4. Hyperlipidemia:  His lipids are outstanding.   He will see Dr. Eden Emms back in 6 months.  Madolyn Frieze Jens Som, MD, [facility]  Electronically Signed    BSC/MedQ  DD: 07/21/2006  DT: 07/21/2006  Job #: [id number]   cc:   Ernestina Penna, M.D.

## 2010-06-08 NOTE — Assessment & Plan Note (Signed)
OFFICE VISIT   Danny [name], Danny [name]  DOB:  07/09/1931                                       06/12/2008  ZOXWR#:[id number]   The patient has had a right subclavian carotid bypass carried out  11/26/2007 at [facility].   Followup duplex reveals a high-grade stenosis at the distal anastomosis  between the graft in the right subclavian artery.   He is complaining of some dizziness and does have evidence of to and fro  flow in the right vertebral artery.   With these findings I do think we should go ahead with an arteriogram  with possible intervention with angioplasty to the distal anastomosis.  This is scheduled for 06/25/2008 at [facility]   Balinda Quails, M.D.  Electronically Signed   PGH/MEDQ  D:  06/12/2008  T:  06/13/2008  Job:  [id number]

## 2010-06-08 NOTE — Procedures (Signed)
CAROTID DUPLEX EXAM   INDICATION:  Follow-up evaluation of known carotid artery disease.   HISTORY:  Diabetes:  No.  Cardiac:  Coronary artery bypass graft in 2003.  Hypertension:  Yes.  Smoking:  No.  Previous Surgery:  Left carotid endarterectomy on August 21, 2007, by Dr.  Madilyn Fireman.  Right carotid to subclavian artery bypass graft on November 26, 2007.  CV History:  The patient reports frequent dizzy spells with one syncopal  episode.  Amaurosis Fugax No, Paresthesias No, Hemiparesis No                                       RIGHT             LEFT  Brachial systolic pressure:         100               120  Brachial Doppler waveforms:         Biphasic          Triphasic  Vertebral direction of flow:        Bidirectional     Antegrade  DUPLEX VELOCITIES (cm/sec)  CCA peak systolic                   93                102  ECA peak systolic                   271               106  ICA peak systolic                   236               111  ICA end diastolic                   56                23  PLAQUE MORPHOLOGY:                  Soft.             Soft.  PLAQUE AMOUNT:                      Moderate.         Minimal.  PLAQUE LOCATION:                    Proximal ICA, ECA.                  Proximal ICA.   IMPRESSION:  Right common to subclavian artery bypass graft proximal  anastomosis peak systolic velocity 171 cm/sec, distal anastomosis  velocity 312 cm/sec, suggestive of stenosis at the distal anastomosis of  the right common to subclavian artery bypass graft.   A 40-59% right internal carotid artery stenosis (high end of range).   A 20-39% left internal carotid artery stenosis status post  endarterectomy.   ___________________________________________  P. Liliane Bade, M.D.   MC/MEDQ  D:  06/12/2008  T:  06/12/2008  Job:  [id number]

## 2010-06-08 NOTE — Procedures (Signed)
CAROTID DUPLEX EXAM   INDICATION:  Carotid disease, follow up right common carotid to  subclavian artery bypass graft.   HISTORY:  Diabetes:  No.  Cardiac:  CABG.  Hypertension:  Yes.  Smoking:  No.  Previous Surgery:  Left carotid endarterectomy on 08/21/2007, right  common carotid to subclavian artery bypass graft on 11/25/2009.  The  axilla bypass graft was done on 11/26/2007.  CV History:  Currently asymptomatic.  Amaurosis Fugax No, Paresthesias No, Hemiparesis No.                                       RIGHT             LEFT  Brachial systolic pressure:         122               152  Brachial Doppler waveforms:         Abnormal          Normal  Vertebral direction of flow:        Bidirectional     Antegrade  DUPLEX VELOCITIES (cm/sec)  CCA peak systolic                   101               86  ECA peak systolic                   201               97  ICA peak systolic                   217               104  ICA end diastolic                   55                25  PLAQUE MORPHOLOGY:                  Heterogenous      Soft  PLAQUE AMOUNT:                      Moderate          Minimal  PLAQUE LOCATION:                    ICA/ECA           ICA/CCA   IMPRESSION:  1. 60% to 79% stenosis of the right internal carotid artery.  2. Patent left carotid endarterectomy site with a 1% to 39% stenosis      of the left internal carotid artery.  3. Patent right common carotid to subclavian artery bypass graft with      an increased velocity of 241 cm/s noted at the distal anastomosis      level.  4. Bidirectional right vertebral flow was noted with a significant      difference in the bilateral brachial pressures.  5. No significant change noted when compared to the previous      examination on 06/12/2008.   ___________________________________________  Quita Skye. Hart Rochester, M.D.   CH/MEDQ  D:  04/28/2009  T:  04/28/2009  Job:  [id number]

## 2010-06-08 NOTE — Procedures (Signed)
CAROTID DUPLEX EXAM   INDICATION:  Followup of known carotid artery disease.  Patient is  asymptomatic.   HISTORY:  Diabetes:  No.  Cardiac:  CAD, CABG in 2003.  Hypertension:  Yes.  Smoking:  No.  Previous Surgery:  No.  CV History:  Amaurosis Fugax No, Paresthesias No, Hemiparesis No  Patient is taking 365 mg of aspirin daily.                                       RIGHT             LEFT  Brachial systolic pressure:         104               150  Brachial Doppler waveforms:         Monophasic        Triphasic  Vertebral direction of flow:        Retrograde        Antegrade  DUPLEX VELOCITIES (cm/sec)  CCA peak systolic                   102               135  ECA peak systolic                   324               108  ICA peak systolic                   (P-M) 280, (M) 338                  514  ICA end diastolic                   (P-M) 60, (M) 60  104  PLAQUE MORPHOLOGY:                  Mixed             Mixed, calcific  PLAQUE AMOUNT:                      Moderate-severe   Severe  PLAQUE LOCATION:                    ICA, ECA          Bifurcation, ICA   IMPRESSION:  1. Right 60 to 79% ICA stenosis with increased velocities noted from      the proximal ICA through to the mid ICA.  2. Left 60 to 79% ICA stenosis (upper end of range).  3. Left ECA stenosis.  4. Right vertebral artery with retrograde flow.  Patient with known      subclavian steal.  5. Left vertebral artery with antegrade flow and a somewhat resistive      waveform.  6. Bilateral ICA velocities have increased since previous study on      12/04/2006, however, are categorically unchanged.  7. Patient is scheduled to see Dr. Madilyn Fireman on 07/05/2007 at 10 a.m.   ___________________________________________  P. Liliane Bade, M.D.   PB/MEDQ  D:  06/07/2007  T:  06/07/2007  Job:  [id number]

## 2010-06-08 NOTE — Op Note (Signed)
[name], [name] NO.:  [id number]   MEDICAL RECORD NO.:  [id number]          PATIENT TYPE:  INP   LOCATION:  3305                         FACILITY:  [facility]   PHYSICIAN:  Danny [name], M.D.    DATE OF BIRTH:  [birth year]   DATE OF PROCEDURE:  08/21/2007  DATE OF DISCHARGE:                               OPERATIVE REPORT   PREOPERATIVE DIAGNOSIS:  Severe left internal carotid artery stenosis.   POSTOPERATIVE DIAGNOSIS:  Severe left internal carotid artery stenosis.   PROCEDURE:  Left carotid endarterectomy with Dacron patch angioplasty.   SURGEON:  Danny Quails, MD   ASSISTANT:  Jerold Coombe, PA.   ANESTHESIA:  General endotracheal.   CLINICAL NOTE:  Danny [name] is a 77 year old male followed for several  years with known extracranial cerebrovascular disease.  Recently, showed  evidence of progression of left internal carotid artery stenosis.  Arteriography verified these findings along with occlusion of the left  vertebral artery and right subclavian occlusion.  He is brought to the  operating room at this time for planned left carotid endarterectomy.   OPERATIVE PROCEDURE:  The patient was brought to the operating room in  stable condition.  Placed under general endotracheal anesthesia.  Foley  catheter and arterial line in place.  Left neck prepped and draped in  sterile fashion.   Curvilinear skin incision made along the anterior border of the left  sternomastoid muscle.  Dissection carried through the subcutaneous  tissue and platysma with electrocautery.  Deep dissection carried along  the anterior border of the sternomastoid to expose the facial vein which  was ligated with 2-0 silk and divided.  The common carotid artery  mobilized down to the omohyoid muscle encircled with a vessel loop.  The  vagus nerve reflected posteriorly and preserved.  The origin of the  superior thyroid and external carotid were freed and encircled with  vessel  loops.  There was bleeding from the superior thyroid and this was  ligated with 2-0 silk and divided.  The internal carotid artery followed  distally up to the posterior belly of digastric muscle.  The hypoglossal  nerve reflected superiorly and preserved.  The distal internal carotid  artery encircled with a vessel loop.   Operative findings did reveal plaque primarily in the bulb of the left  carotid extending into the origin of left internal carotid artery.  The  patient was administered 7000 units of heparin intravenously.   Carotid vessels were controlled with clamps.  Longitudinal arteriotomy  made in the distal common carotid artery.  The arteriotomy extended  across the carotid bulb and up into the internal carotid artery.  There  was a tight stenosis at the origin of the left internal carotid artery  and moderate plaque.  The shunt was inserted.   The plaque removed with an endarterectomy elevator.  The plaque raised  down to the common carotid artery where it was divided transversely with  Potts scissors.  Raised up into the bulb was superior thyroid and  external carotid were endarterectomized using an eversion technique.  The distal internal carotid artery plaque feathered out well.  Fragments  of plaque removed with fine forceps.  Site irrigated with heparin saline  and dextran solutions.   A patch angioplasty at the endarterectomy site carried out with a  running 6-0 Prolene suture in a Dacron Finesse patch.  At completion of  the patch angioplasty, the shunt removed and all vessels were well  flushed.  Clamps were removed directing the initial antegrade flow up  the external carotid artery, following this the internal carotid was  released.  Excellent pulse and Doppler signal in the distal internal  carotid artery.  Adequate hemostasis was obtained.  The patient was  administered 50 mg of protamine intravenously.   Sponge and instrument counts correct.  The  sternomastoid fascia closed  with running 2-0 Vicryl suture.  Platysma closed with running 3-0 Vicryl  suture.  Skin closed with 4-0 Monocryl.  Dermabond applied.   The patient awakened readily.  Moved all extremities to command.  Transferred to recovery room in stable condition.  No apparent  complications.      Danny [name], M.D.  Electronically Signed     PGH/MEDQ  D:  08/21/2007  T:  08/22/2007  Job:  [id number]   cc:   Danny [name]. Danny Emms, MD, Danny [name]  Danny [name], M.D.

## 2010-06-08 NOTE — Op Note (Signed)
NAMECALVERT, CHARLAND NO.:  [id number]   MEDICAL RECORD NO.:  [id number]          PATIENT TYPE:  INP   LOCATION:  3306                         FACILITY:  [facility]   PHYSICIAN:  Balinda Quails, M.D.    DATE OF BIRTH:  March 11, 1931   DATE OF PROCEDURE:  11/26/2007  DATE OF DISCHARGE:                               OPERATIVE REPORT   SURGEON:  Balinda Quails, MD   ASSISTANT:  RNFA.   ANESTHETIC:  General endotracheal.   ANESTHESIOLOGIST:  Bedelia Person, MD   PREOPERATIVE DIAGNOSIS:  Right subclavian steal.   PREOPERATIVE DIAGNOSIS:  Right subclavian steal.   PROCEDURE:  Right carotid subclavian bypass.   CLINICAL NOTE:  Nik Gorrell is a 77 year old male with known  extracranial cerebrovascular occlusive disease.  He is status post left  carotid endarterectomy.  He has left vertebral occlusion.  Right  subclavian occlusion with retrograde right vertebral flow.  He has  symptoms of lightheadedness, presyncope, and right arm fatigue.  He was  brought to the operating room at this time for right carotid subclavian  bypass.   OPERATIVE PROCEDURE:  The patient was brought to the operating room in  stable condition.  He was placed in supine position.  General  endotracheal anesthesia was induced.  Foley catheter and arterial line  in place.  Right neck was prepped and draped in the sterile fashion.   A transverse skin incision was made in the left supraclavicular fossa.  Dissection was carried down through the subcutaneous tissue.  Platysma  was divided.  The clavicular head of the right sternomastoid muscle was  divided.  The right subclavian artery was identified.  The  supraclavicular fat pad was mobilized superiorly.  The right scalenus  and vagus muscle was divided and the phrenic nerve was reflected  medially and preserved.  The right subclavian artery was mobilized and  encircled proximally and distally with vessel loops.  The thyrocervical  trunk controlled with a  fine vessel loop.   The jugular vein was then retracted laterally.  The vagus nerve was  identified and the proximal common carotid artery was identified.  This  was mobilized and encircled proximally and distally with vessel loops.  This was a soft vessel with no significant disease.  The tunnel was then  created behind the right internal jugular vein and anterior to the  nerves between the two arteries.   The patient was administered 7000 units of heparin intravenously.  Adequate circulation time was permitted.  The right common carotid  artery was controlled with clamps.  The longitudinal arteriotomy was  made in the lateral wall.  A 6-mm Dacron graft was anastomosed end to  side to the right common carotid artery using running 6-0 Prolene  suture.  The vessel was then flushed well and released and the clamp was  placed on the graft.  The graft was tunneled behind the internal jugular  vein.  The right subclavian artery was controlled proximally and  distally to the clamps.  Longitudinal arteriotomy was made.  The graft  was beveled and anastomosed  end to side to the right subclavian artery  using running 6-0 Prolene suture.  Flushing was carried out.  Initial  antegrade flow was then sent down the arm.  Clamps were removed and  excellent flow was present.  Adequate hemostasis was obtained.  The  patient was administered 50 mg of protamine intravenously.   Sponges and instrument counts were correct.  Sternomastoid muscle was  reapproximated with interrupted 2-0 Vicryl suture.  The platysma was  closed with running 3-0 Vicryl suture. The skin was closed with 4-0  Monocryl.  Dermabond was applied.  No apparent complications.  The  patient was transferred to the recovery room in stable condition.      Balinda Quails, M.D.  Electronically Signed     PGH/MEDQ  D:  11/26/2007  T:  11/26/2007  Job:  [id number]

## 2010-06-08 NOTE — Procedures (Signed)
CAROTID DUPLEX EXAM   INDICATION:  Follow up carotid artery disease.   HISTORY:  Diabetes:  No.  Cardiac:  CAD, CABG in 2003.  Hypertension:  Yes.  Smoking:  No.  Previous Surgery:  No.  CV History:  No.  Amaurosis Fugax :  No, Paresthesias :  No, Hemiparesis:  No                                       RIGHT               LEFT  Brachial systolic pressure:         101                 152  Brachial Doppler waveforms:         Monophasic          Triphasic  Vertebral direction of flow:        Retrograde          Antegrade  DUPLEX VELOCITIES (cm/sec)  CCA peak systolic                   100                 118  ECA peak systolic                   261                 100  ICA peak systolic                   266                 432  ICA end diastolic                   61                  80  PLAQUE MORPHOLOGY:                  Mixed               Mixed  PLAQUE AMOUNT:                      Moderate-severe     Moderate-severe  PLAQUE LOCATION:                    ICA/ECA/bifurcation ICA/bifurcation   IMPRESSION:  Bilateral internal carotid artery stenosis of 60% to 79%.   Left greater stenosis than right.   Right external carotid artery stenosis.   Right vertebral artery is retrograde, left is antegrade.   Known right subclavian steal syndrome.   No significant changes from previous study.   ___________________________________________  P. Liliane Bade, M.D.   AS/MEDQ  D:  12/04/2006  T:  12/05/2006  Job:  [id number]

## 2010-06-08 NOTE — Discharge Summary (Signed)
Danny [name], Danny [name] NO.:  [id number]   MEDICAL RECORD NO.:  [id number]          PATIENT TYPE:  INP   LOCATION:  3306                         FACILITY:  [facility]   PHYSICIAN:  Balinda Quails, M.D.    DATE OF BIRTH:  [birth year]   DATE OF ADMISSION:  11/26/2007  DATE OF DISCHARGE:  11/27/2007                               DISCHARGE SUMMARY   FINAL DIAGNOSES:  1. Right subclavian steal syndrome.  2. Coronary artery disease, status post coronary artery bypass      grafting.  3. Hyperlipidemia.  4. Status post left carotid endarterectomy.   PROCEDURES PERFORMED:  Right carotid to right subclavian bypass by Dr.  Madilyn Fireman on November 26, 2007.   COMPLICATIONS:  None.   DISCHARGE MEDICATIONS:  He is instructed to resume all previous  medications consisting of:  1. Lopressor 25 mg p.o. b.i.d.  2. Advicor 1000/40 mg p.o. daily.  3. Diovan 80 mg p.o. daily.  4. Aspirin 325 mg p.o. daily.  5. Fish oil 1000 mg p.o. b.i.d.  6. Multivitamin p.o. daily.  7. Vitamin D3 two tablets p.o. daily.  8. He is given a prescription for Percocet 5/325 one p.o. q.4 h.      p.r.n. pain, total #30 were given.   CONDITION AT DISCHARGE:  Stable, improving.   DISPOSITION:  He is being discharged home in stable condition with his  wound healing well.  He is given careful instructions regarding the care  of his wounds and his activities.  He is to see Dr. Madilyn Fireman in 2 weeks,  the office will arrange a visit.   BRIEF IDENTIFYING STATEMENT:  For complete details, please refer the  typed history and physical.  Briefly, this very pleasant 77 year old  gentleman was referred to Dr. Madilyn Fireman.  He was found to have a right  subclavian steal syndrome.  Dr. Madilyn Fireman recommended right carotid to  subclavian bypass.  [name] was informed of the risks and benefits of  the procedure and after careful consideration, elected to proceed with  surgery.   HOSPITAL COURSE:  Preoperative workup was completed as an  outpatient.  He was brought in through Same Day Surgery and underwent the  aforementioned revascularization procedure.  Following, he tolerated the  procedure, and  was returned to the Post Anesthesia Care Unit, extubated.  Following  stabilization, he was transferred to a bed in a Surgical Step-Down Unit.  He was observed overnight.  The following morning, he was found to be  stable.  He had good pulses in his right arm.  He was desirous of  discharge and was discharged in stable condition.      Wilmon Arms, PA      P. Liliane Bade, M.D.  Electronically Signed    KEL/MEDQ  D:  11/27/2007  T:  11/28/2007  Job:  [id number]

## 2010-06-08 NOTE — Consult Note (Signed)
NAMEHAYLEN, [name] NO.:  [id number]   MEDICAL RECORD NO.:  [id number]          PATIENT TYPE:  AMB   LOCATION:                               FACILITY:  [facility]   PHYSICIAN:  Marin Roberts, MDDATE OF BIRTH:  1931/10/17   DATE OF CONSULTATION:  DATE OF DISCHARGE:                                 CONSULTATION   REFERRING PHYSICIAN:  Balinda Quails, M.D.   REASON FOR CONSULTATION:  Intracranial interpretation of cerebral  arteriogram performed in conjunction with right common carotid  arteriogram and carotid-subclavian bypass grafting balloon angioplasty.   FINDINGS:  AP and lateral views of the right common carotid injection  are submitted.  These images demonstrate a normal appearance of the  distal right internal carotid artery.  The A1 and M1 segments fill  normally.  There is no significant cross flow through the anterior  communicating artery.  No significant proximal stenosis, aneurysm, or  branch vessel occlusion is present.  The dural sinuses fill normally.  A  patent posterior communicating artery is present with filling of both  posterior cerebral arteries.  There is retrograde flow into the basilar  artery.  Additionally, the right vertebral artery fills from below as  the injection was performed below the level of the carotid-subclavian  anastomosis.  Therefore contrast went into the subclavian and  subsequently the right vertebral artery.  There is bidirectional flow  within the basilar artery.  Given the position of the catheter, it is  difficult to know the meaning of this bidirectional flow.  It may be  related to stenosis within the bypass graft.   IMPRESSIONS:  1. No significant stenosis, aneurysm, or branch vessel occlusion.  2. Filling of the right vertebral and basilar artery both from below      via the anastomosis and from above the posterior communicating      artery with bidirectional contrast flow and in the basilar artery      of  unknown significance.      Marin Roberts, MD  Electronically Signed     CM/MEDQ  D:  07/03/2008  T:  07/04/2008  Job:  [id number]

## 2010-06-08 NOTE — Assessment & Plan Note (Signed)
[facility]                            CARDIOLOGY OFFICE NOTE   OMARR, HANN                        MRN:          [id number]  DATE:01/16/2008                            DOB:          1932/01/15    Demone returns today for followup.  I have not seen him in a while,  quite a bit has happened since I last saw him, I spoke to Dr. Christell Constant  about him.  I have been following the patient for previous coronary  artery disease with CABG.  He has significant upper extremity vascular  disease and been followed by Dr. Madilyn Fireman.   For a long time, he had significant right subclavian steal and I thought  he should have this operated on.  He had been having fatigue in his  right arm.  He had this followed for about 6 years.  He subsequently  also developed significant bilateral carotid disease.  The patient ended  up having a left carotid endarterectomy by Dr. Madilyn Fireman, followed by right  subclavian bypass.   The operations were not complicated by any cardiac problems.  His rhythm  was stable.  There was no preop MI and the patient's main preop  complications involved bladder retention.  He required a Foley catheter.   Shortly after arriving home, he had a syncopal episode.   He had no warning.  There were no palpitations.  No chest pain.  He  found himself on the floor.  He hurt his left elbow, requiring care by  his general medical doctor.  Apparently, he had some olecranon bursitis.   The patient did not have any recurrences.  However, he indicates that  every other day he could have a few episodes of a blurry vision.  Dr.  Christell Constant appropriately arranged for him to have an event monitor to monitor  his heart rate during these spells.  The patient tells me that Dr. Christell Constant  told him there was no significant arrhythmias during these spells.  He  tends always be a little bit bradycardic and sinus rhythm in the high  50s and he has tolerated low-dose Lopressor for many  years.  In talking  to the patient, I really do not get a sense that there is any cardiac  issue here.  I suspect that his circulation is re-equilibrating after  having his right subclavian steal relieved and his carotid operated on.  He may have had microemboli.  He continues to have a chronically  occluded left vertebral and I do not know how this may bearing any  recurrent symptoms.   His blood pressure has been under good control.  He has been compliant  with his medications and has not had any further total syncopal spells.  He had an MRI done, which I reviewed on January 06, 2008, outside of  the poor flow in the left vertebral.  There were no acute abnormalities  to suggest embolic event.   CURRENT MEDICATIONS:  1. Aspirin.  2. Multivitamin.  3. Zetia 10 a day.  4. Advicor.  5. Fish oil.  6.  Vitamin B.  7. Lopressor 25 b.i.d.  8. Diovan 80 a day.   PHYSICAL EXAMINATION:  GENERAL:  Remarkable for a thin male in no  distress.  Affect is appropriate.  VITAL SIGNS:  Weight 171, blood pressure is 150/70 in the left arm, and  135/70 in the right arm, respiratory rate 14, and afebrile.  HEENT:  Unremarkable.  NECK:  Carotids have persistent bilateral bruits with the left CEA.  He  had a subclavian bypass scar on the right with some supra and clavicular  bruits.  No JVP elevation.  LUNGS:  Clear.  Good diaphragmatic motion.  No wheezing.  CARDIAC:  S1 and S2 with a systolic ejection murmur, status post  sternotomy, PMI normal.  ABDOMEN:  Benign.  Bowel sounds positive.  No AAA, no tenderness, no  bruit, no hepatosplenomegaly, no hepatojugular reflux. EXTREMITIES:  No  edema.  NEUROLOGIC:  Nonfocal.  SKIN:  Warm and dry.  MUSCULOSKELETAL:  No muscular weakness.   IMPRESSION:  1. Carotid disease followed by Dr. Madilyn Fireman, residual disease in the      right, status post left carotid endarterectomy.  Follow up duplex      in 6 months.  2. Right subclavian steal with recent  bypass.  For the first time that      I have known Melinda, I can get a blood pressure in his right arm.      He has a good radial pulse.  His symptoms regarding right arm      fatigue improved.  3. Syncopal episode.  I am not sure why this occurred, does not appear      to be cardiac in etiology.  He will continue to transmit with his      event monitor.  However, they currently would not appear to be any      significant brady arrhythmias and we will continue his Lopressor.  4. Hypercholesteremia in the setting of vascular disease.  Continue      Advicor and Zetia.  The patient has previously been on Pravachol,      which he did not tolerate.  5. Hypertension, currently well controlled.  No evidence of postural      symptoms.  Continue diet.  Overall, I think Armstead is recovering.      He will follow up primarily with Dr. Madilyn Fireman for his vascular issues.      His last nuclear study was done on August 01, 2006 at [facility].  He had an EF of 64% and fairly normal.  Scintigraphic      images with no ischemia or infarction, and since he is not having      angina, I do not think he needs any further workup for his ischemic      disease and previous coronary artery bypass graft.     Noralyn Pick. Eden Emms, MD, [facility]  Electronically Signed    PCN/MedQ  DD: 01/16/2008  DT: 01/16/2008  Job #: [id number]

## 2010-06-08 NOTE — Assessment & Plan Note (Signed)
OFFICE VISIT   PERNELL, LENOIR  DOB:  [birth year]                                       07/19/2007  ZOXWR#:[id number]   The patient returns to the office today with the results of his CT  angiogram.  By CT angiography he does not have severe internal carotid  artery stenoses bilaterally.  This is in discordance with his carotid  Doppler studies which shows significantly increased internal carotid  artery velocities bilaterally.   With these findings I think we should go ahead and do a formal cerebral  arteriogram.  This is scheduled for [facility] on 07/24/2007.   Balinda Quails, M.D.  Electronically Signed   PGH/MEDQ  D:  07/19/2007  T:  07/20/2007  Job:  [id number]

## 2010-06-08 NOTE — H&P (Signed)
NAMESARATH, PRIVOTT NO.:  [id number]   MEDICAL RECORD NO.:  [id number]          PATIENT TYPE:  INP   LOCATION:  3305                         FACILITY:  [facility]   PHYSICIAN:  Balinda Quails, M.D.    DATE OF BIRTH:  1931-05-19   DATE OF ADMISSION:  08/21/2007  DATE OF DISCHARGE:                              HISTORY & PHYSICAL   CARDIOLOGIST:  Theron Arista C. Eden Emms, MD, [facility]   PRIMARY CARE PHYSICIAN:  Ernestina Penna, MD   ADMISSION DIAGNOSIS:  Severe progressive left internal carotid artery  stenosis.   HISTORY:  Danny [name] is a 77 year old gentleman with a history of known  extracranial cerebrovascular disease.  He has a right subclavian steal  with occlusion of the right subclavian artery.  He has known moderate  stenosis of the internal carotid arteries bilaterally.  A recent visit  to the office revealed progression of his left internal carotid artery  stenosis to the severe range.  This was verified by CT angiography and  also arteriography.   He is admitted to the hospital at this time for treatment of severe  progressive left internal carotid artery stenosis with planned left  carotid endarterectomy.   The patient has no history of stroke or TIA.   PAST MEDICAL HISTORY:  1. Coronary artery disease, status post coronary bypass.  2. Hyperlipidemia.  3. Known right subclavian steal.   MEDICATIONS:  1. Lopressor 25 mg b.i.d.  2. Advicor 1000/40 one tablet daily.  3. Diovan 80 mg daily.  4. Aspirin 325 mg daily.  5. Fish oil 1000 mg 2 tablets daily.  6. Multivitamin 1 tablet daily.  7. Vitamin D3 two tablets daily.   ALLERGIES:  None known.   FAMILY HISTORY:  Noncontributory.   REVIEW OF SYSTEMS:  The patient denies any symptoms of syncope or  presyncope.  No recent chest pain or shortness of breath.  No cough or  sputum production.  No weight loss.  Normal bowel habits.  No dysuria or  frequency.   PHYSICAL EXAMINATION:  GENERAL:  Well-appearing  78 year old male.  Alert  and oriented.  VITAL SIGNS:  BP 155/77, pulse is 86 per minute, respirations 20 per  minute, temperature 97, and O2 sat 100%.  HEENT:  Mouth and throat are clear.  Normocephalic.  Extraocular  movements intact.  NECK:  Supple.  No thyromegaly or adenopathy.  CARDIOVASCULAR:  Bilateral carotid bruits.  Normal heart sounds without  murmurs.  No gallops or rubs.  Regular rate and rhythm.  CHEST:  Clear with equal entry bilaterally.  No rales or rhonchi.  ABDOMEN:  Soft and nontender.  No masses or organomegaly.  Normal bowel  sounds.  No abdominal bruits.  EXTREMITIES:  Full range of motion.  No ankle edema.  NEUROLOGIC:  Cranial nerves intact.  Strength equal bilaterally.  Normal  reflexes.  SKIN:  Warm, dry, and intact.   PRIMARY ADMISSION DIAGNOSIS:  Severe progressive left internal carotid  artery stenosis.   SECONDARY DIAGNOSES:  1. Coronary artery disease.  2. Hyperlipidemia.  3. Right subclavian steal.  Balinda Quails, M.D.  Electronically Signed     PGH/MEDQ  D:  08/21/2007  T:  08/22/2007  Job:  [id number]   cc:   Noralyn Pick. Eden Emms, MD, [facility]  Ernestina Penna, M.D.

## 2010-06-08 NOTE — Assessment & Plan Note (Signed)
[facility]                       [place] CARDIOLOGY OFFICE NOTE   Danny, [name]                        MRN:          [id number]  DATE:02/14/2007                            DOB:          1931-07-28    Danny [name] returns today in followup.  He had CABG back in 2003.  He  has good LV function.  He has hypercholesterolemia.  He also has  peripheral vascular disease with carotid bruits and subclavian steal on  the right side. Unfortunately, I do not have Danny [name]' recent notes. In the  past I have thought that he would need a carotid-to-subclavian bypass,  but Danny [name]'s symptom of right arm weakness is more of a nuisance. There  is no limb-threatening ischemia.   He follows with Dr. Madilyn [name] regularly.   His beta blocker has been stopped.  He tends to run low heart rate.  His  medications have been changed.  He was worried about Zetia given the  enhanced trial results and is now on Advicor   He sees Dr. Christell [name] for his general medical needs.  In general, he is not  having any significant chest pain.  We did a stress test on him in July  2008.  He got extremely short of breath. I would have bet that he would  have been ischemic; however, his scintigraphic images were normal with  an EF of 64%, and there was no LV cavity dilatation   REVIEW OF SYSTEMS:  Otherwise negative.   CURRENT MEDICATIONS:  1. Advicor 1 gram/40.  2. Fish oil.  3. An aspirin a day.   PHYSICAL EXAMINATION:  VITAL SIGNS:  Exam is remarkable for blood  pressure of 130/72 on the left and 100/60 on the right, pulse 60 and  regular,  respiratory rate 14, afebrile.  HEENT:  Unremarkable.  NECK:  Carotids are without bruit.  No lymphadenopathy, thyromegaly, JVP  elevation.  Left carotid bruit, greater than right, right subclavian  bruit.  LUNGS:  Clear with diaphragmatic motion.  No wheezing,  CARDIAC:  S1-S2. Normal heart sounds.  PMI normal.  ABDOMEN:  Benign. Bowel sounds.  No  AAA, no bruit.  No  Hepatosplenomegaly.  No hepatojugular reflux.  EXTREMITIES:  Pulses intact.  No edema.  NEUROLOGIC:  Nonfocal.  SKIN:  Warm and dry.  MUSCULOSKELETAL:  No muscular weakness.   Lab work was reviewed from Dr. Kathi [name] office.   His LFTs were mildly elevated with a GGT of 66. Vitamin D level was low-  normal.   We did get a copy of his duplex from November from Dr. Madilyn [name]' office. He  had bilateral internal carotid artery stenosis 60-79%.  He did have  evidence for vertebral steal on  the right,  but the study was deemed  stable from previously.   IMPRESSION:  1. Coronary disease, previous coronary artery bypass grafting in 2003      with good left ventricular function.  No angina. Not on beta      blockers due to relative bradycardia. Continue aspirin therapy.      Follow up stress Myoview in July  2009. We may need to use adenosine      as the patient did not like exercising last time.  2. Hypercholesterolemia.  Continue Advicor.  Lipid and liver profile      with Dr. Christell [name]. He does get some flushing with his niacin but takes      his aspirin one-half hour before this.  3. History of subclavian steal with right subclavian stenosis. Follow      up with Dr. Madilyn [name]. Continue aspirin therapy. Differential and blood      pressure chronic and stable.  4. Bilateral carotid artery stenosis, no evidence of transient      ischemic attack.  Follow up duplex in 6 months.  Continue aspirin      therapy.   Overall, Danny [name] is doing well.  I will see him back in 6 months.  He was  kind enough to give [place] some cards for his Danny [name] in  Danny [name].  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     Danny [name]. Eden Emms, MD, [facility]  Electronically Signed    PCN/MedQ  DD: 02/14/2007  DT: 02/14/2007  Job #: [id number]

## 2010-06-11 NOTE — Discharge Summary (Signed)
[place]. [facility]  Patient:    Danny [name], Danny [name] Visit Number: [id number] MRN: [id number]          Service Type: SUR Location: 2000 2040 01 Attending Physician:  Danny [name] Dictated by:   Danny [name], P.A. Admit Date:  07/25/2001 Discharge Date: 07/31/2001   CC:         Danny [name], M.D. [facility]   Discharge Summary  DATE OF BIRTH: 04/16/1931  ADMISSION DIAGNOSIS: Chest pain.  FINAL DIAGNOSES: 1. New onset exertional angina. 2. Severe three vessel coronary artery disease with normal left ventricle and    EF of 70%. 3. Carotid artery disease with a 60-80% right ICA stenosis and 40-60% left ICA    stenosis, asymptomatic. 4. Mild right lower extremity peripheral vascular disease with ABI of 0.84. 5. Mild volume overload. 6. Remote smoking.  PROCEDURES: 1. Cardiac cath on July 25, 2001. 2. Presurgery Doppler exam on July 25, 2001. 3. CABG times three on July 26, 2001 with the following grafts: LIMA to LAD,    saphenous vein graft to OM3, right internal mammary artery to intermediate    coronary artery.  HISTORY OF PRESENT ILLNESS: The patient is a 77 year old white male with no previous history of coronary artery disease who presented to cardiology on July 20, 2001 after six months of exertional angina type chest pain. He was brought in for cardiac cath which demonstrated severe three vessel coronary artery disease and critical left main stenosis. Danny [name] was consulted and recommended CABG. Risks, benefits, details, and alternatives were discussed. It was agreed to proceed. Routine presurgical studies included Doppler exam which showed bilateral ICA stenosis. He has been asymptomatic with regard to this. He was stable for surgery.  HOSPITAL COURSE: He underwent the procedure on July 26, 2001. There were no complications. He was taken to the SICU in stable condition. Postop, Danny [name] did well with routine care. He was transferred to unit  2000. On postoperative day one, he began working with cardiac rehab and walking. He felt okay overall. He had some mild anemia. He was put on iron. On postoperative day three, July 29, 2001, he is walking around in the hall unassisted. He is afebrile. Vital signs are stable. Heart rate 87 with normal sinus rhythm. Room air sat is 92%. He is seven pounds over his preop weight. His H&H is 8.3 and 24.4. Physical exam reveals clear lungs. Wounds are healing well. It is anticipated that he should be stable for discharge pending satisfactory morning rounds on Monday, July 30, 2001.  MEDICATIONS: 1. Enteric coated aspirin 325 mg one p.o. q.d. 2. Lasix 40 mg one p.o. q.d. times seven days. 3. KCL 20 meq one p.o. q.d. times seven days. 4. Lopressor 25 mg p.o. q.12h. 5. Fergon 300 mg p.o. b.i.d. 6. Colace 100 mg p.o. b.i.d. 7. Ultram 50 mg one to two p.o. q.4-6h. p.r.n. pain.  ALLERGIES: None known.  CONDITION: Stable.  DISPOSITION: To home.  SPECIAL INSTRUCTIONS: He is told to avoid driving, heavy lifting, strenuous activity, working. He is told to walk daily and use his incentive spirometer daily. He can shower. He is to clean his wound gently daily with soap and water and to call the office if he has any problems with his wounds. He is told to get a chest x-ray when he sees Danny [name] in two weeks and to bring it with him to see Danny [name].  FOLLOW-UP: 1. Danny [name] two weeks after  discharge. 2. Danny [name] in three weeks after discharge. 3. He will also need periodic follow-up with regard to his recently diagnosed    coronary artery disease. Dictated by:   Danny [name], P.A. Attending Physician:  Danny [name] DD:  07/29/01 TD:  07/31/01 Job: [id number]

## 2010-06-11 NOTE — Op Note (Signed)
Danny [name], Danny [name]                           ACCOUNT NO.:  [id number]   MEDICAL RECORD NO.:  [id number]                   PATIENT TYPE:  OIB   LOCATION:  NA                                   FACILITY:  [facility]   PHYSICIAN:  Balinda Quails, M.D.                 DATE OF BIRTH:  02-20-1931   DATE OF PROCEDURE:  01/31/2002  DATE OF DISCHARGE:                                 OPERATIVE REPORT   DIAGNOSES:  1. Right subclavian artery occlusion.  2. Right subclavian steal.   PROCEDURE:  Arch aortogram.   ACCESS:  Right common femoral artery 5 French sheath.   CONTRAST:  Visipaque 65 mL.   COMPLICATIONS:  None apparent.   CLINICAL NOTE:  This is a 77 year old male with a history of coronary artery  disease.  He has previously undergone coronary artery bypass.  He has  complaints of occasional dizziness and marked fatigue in the right arm,  especially during physical activity.  He underwent work-up for this which  revealed marked reduction in pressures in his right arm.  He underwent an MR  angiogram revealing evidence of probable occlusion of the right subclavian  artery origin with retrograde flow in the right vertebral artery.  He is  brought to the catheterization lab at this time for diagnostic  arteriography.   DESCRIPTION OF PROCEDURE:  The patient was brought to the catheterization  lab in stable condition.  He was placed in the supine position.  The right  groin was prepped and draped in a sterile fashion.  The skin and  subcutaneous tissue in the right groin was instilled with 1% Xylocaine.   A needle was easily introduced in the right common femoral artery.  A 0.35  Wholey guide wire was advanced through the needle into the midabdominal  aorta under fluoroscopy.  The needle was removed, a 5 [place] sheath advanced  over the guide wire, later removed and the sheath, flushed with heparin and  saline solution.   The pigtail catheter was then advanced over the guide wire.   The guide wire  and pigtail catheter were then advanced into the aortic arch and ascending  aorta.   A 40-degree LAO projection arch aortogram was obtained.  This revealed the  innominate artery to be widely patent.  The left common carotid artery was  also widely patent.  The left subclavian origin and proximal left subclavian  artery were widely patent.  There was an occlusion of the right subclavian  artery origin.  Retrograde flow was noted in the right vertebral artery  reconstituting the right subclavian artery.   The common carotid arteries were widely patent bilaterally.  There was  irregular plaque at the left internal carotid artery origin with ulceration  with approximately a 50% stenosis.  The right carotid bifurcation revealed  minimal approximately 30% right internal carotid artery stenosis.   Repeat  arch aortogram was obtained in the AP projection. This further  delineated the right subclavian artery origin.  There was occlusion at the  right subclavian arch and origin, retrograde flow again noted to the right  vertebral artery with re-constitution of the distal right subclavian artery.   Further, of note, the left subclavian artery was widely patent proximally  with antegrade flow to the left vertebral artery.   This completed the arteriogram procedure.  The guide wire was reinserted.  The pigtail catheter removed.  The patient was transferred to holding area  where the right femoral sheath was removed.  No apparent complications.   FINAL IMPRESSION:  1. Right subclavian artery occlusion at its origin.  2. Right subclavian steal.   DISPOSITION:  These results will be reviewed with this patient and family.  Recommendations for right carotid subclavian bypass for relief of  symptomatic right subclavian steal.                                                  Balinda Quails, M.D.    PGH/MEDQ  D:  01/31/2002  T:  01/31/2002  Job:  [id number]   cc:   Charlton Haws,  M.D. [facility]   Peripheral Vascular Catheterization Lab

## 2010-06-11 NOTE — Op Note (Signed)
Danny [name], Danny [name]                           ACCOUNT NO.:  [id number]   MEDICAL RECORD NO.:  [id number]                   PATIENT TYPE:  AMB   LOCATION:  ENDO                                 FACILITY:  [facility]   PHYSICIAN:  John C. Madilyn Fireman, M.D.                 DATE OF BIRTH:  Feb 07, 1931   DATE OF PROCEDURE:  06/17/2003  DATE OF DISCHARGE:                                 OPERATIVE REPORT   PROCEDURE:  Colonoscopy.   INDICATIONS FOR PROCEDURE:  Heme-positive stool.   DESCRIPTION OF PROCEDURE:  The patient was placed in the left lateral  decubitus position and placed on the pulse monitor with continuous low-flow  oxygen delivered by nasal cannula.  He was sedated with 50 mcg IV fentanyl  and 5 mg IV Versed.  The Olympus video colonoscope was inserted into the  rectum and advanced to the cecum, confirmed by transillumination at  McBurney's point and visualization of the ileocecal valve and appendiceal  orifice.  The prep was excellent.  The cecum and ascending colon all  appeared normal with no masses, polyps, diverticula, or other mucosal  abnormalities.  Within the transverse colon, there was an 8 mm sessile polyp  with an inflamed-appearing tip with central exudate.  I was not sure if this  was a polyp or some atypical neoplasm.  It was cautiously fulgurated by hot  biopsy and was approximately 8 mm in diameter.  The remainder of the  transverse, descending, sigmoid, and rectum appeared normal with no further  polyps, masses, diverticula, or other mucosal abnormalities.  The scope was  then withdrawn, and the patient returned to the recovery room in stable  condition.  He tolerated the procedure well, and there were no immediate  complications.   IMPRESSION:  Atypical-appearing transverse colon polyp.   PLAN:  Await histology.                                               John C. Madilyn Fireman, M.D.    JCH/MEDQ  D:  06/17/2003  T:  06/17/2003  Job:  [id number]   cc:   Ernestina Penna,  M.D.  [address] [place]  [place] [address]  Fax: [phone]

## 2010-06-11 NOTE — Consult Note (Signed)
Danny [name], Danny [name] NO.:  [id number]   MEDICAL RECORD NO.:  [id number]          PATIENT TYPE:  AMB   LOCATION:  SDS                          FACILITY:  [facility]   PHYSICIAN:  Marin Roberts, MDDATE OF BIRTH:  1931-08-08   DATE OF CONSULTATION:  DATE OF DISCHARGE:  07/24/2007                                 CONSULTATION   REFERRING PHYSICIAN:  Balinda Quails, MD   REASON FOR REFERRAL:  Intracranial interpretation of carotid arteriogram  performed on July 24, 2007.   The patient had bilateral carotid arteriogram performed July 24, 2007.  AP and lateral intracranial imaging was performed from each injection.   Right common carotid injection:  The distal internal carotid artery is  within normal limits.  The A1and M1 segments are normal.  The MCA and  ACA branch vessels are unremarkable.  There is flash filling of the left  ACA across the patent anterior communicating artery.  The right  posterior cerebral artery filling via a patent posterior communicating  artery.  There is retrograde flash filling of the basilar and right  vertebral artery.  The right transverse sinus is dominant.   Left common carotid injection:  The distal left internal carotid artery  is within normal limits.  The A1 and M1 segments are normal.  ACA and  MCA branch vessels are unremarkable.  Some inflow of the right ACA can  be seen.  There is again a fetal type left posterior cerebral artery.  Retrograde filling of the basilar to the right vertebral artery is  evident.   Given the proximal occlusion and distal reconstitution of the left  vertebral artery and the occluded right subclavian artery, this may  represent a steal phenomenon from the anterior circulation.   Impressions:  1. No significant proximal stenosis, aneurysm, or branch vessel      occlusion.  2. Right subclavian steal syndrome.  The majority of the diverted flow      appears to be from the anterior  circulation.      Marin Roberts, MD  Electronically Signed     CM/MEDQ  D:  07/31/2007  T:  07/31/2007  Job:  [id number]

## 2010-06-11 NOTE — Discharge Summary (Signed)
   NAMEACHILLES, NEVILLE                           ACCOUNT NO.:  [id number]   MEDICAL RECORD NO.:  [id number]                   PATIENT TYPE:  INP   LOCATION:  2040                                 FACILITY:  [facility]   PHYSICIAN:  Jody P. Melvyn Neth, P.A.                 DATE OF BIRTH:  1931/05/27   DATE OF ADMISSION:  07/25/2001  DATE OF DISCHARGE:  07/31/2001                                 DISCHARGE SUMMARY   ADDENDUM:  Mr. Corti was originally scheduled for discharge on 07/30/01.  However, he felt a little bit dizzy and weak.  He still had his pacer wires  in.  It was felt he would benefit from another day in order to work on his  conditioning and to get his pacer wires out.  His pacer wires were DC'd and  he spent the day working with cardiac rehab.  He was tired but he made a  good effort.   The following day, 07/31/01, he said he was feeling better.  He was afebrile  and vital signs were stable.  He was in sinus rhythm.  Physical exam was  satisfactory.  He was discharged home in stable condition.   There were no changes with his medications.  All of his discharge  instructions remained the same.                                               Jody P. Diamond Nickel.    JPL/MEDQ  D:  08/27/2001  T:  08/31/2001  Job:  [id number]

## 2010-06-11 NOTE — Cardiovascular Report (Signed)
[place]. [facility]  Patient:    Danny [name], Danny [name] Visit Number: [id number] MRN: [id number]          Service Type: SUR Location: 2000 2040 01 Attending Physician:  Tressie Stalker Dictated by:   Noralyn Pick. Eden Emms, M.D. [facility] Proc. Date: 07/25/01 Admit Date:  07/25/2001   CC:         Alycia Patten, N.P.   Cardiac Catheterization  INDICATIONS:  Clinical unstable angina.  ACCESS:  Cinecatheterization is done through the right femoral artery.  It became obvious as soon as we placed the Judkins left 4 catheter in the patients aortic root that there was severe damping to the left main coronary artery only limited injections could be done as the patient became significantly bradycardic and hypotensive.  Flush injections revealed a 95% left main stenosis.  We did take two injections of the left main with pullback on the catheter to reveal the distal vessels.  The LAD appeared to be patent with two diagonal branches of medium size.  The circumflex system appeared to consist mainly of one AV groove branch and an OM branch.  The OM branch had 50% multiple discrete lesions in it.  The distal left main also had a 50% tubular stenosis.  Imaging of the right coronary artery showed a 30% distal lesion.  Interestingly, there were right to left collaterals to the LAD which suggested the left main was critically tight requiring collateralization of the LAD.  RAO ventriculography revealed normal LV function with an EF of 60%.  There was no gradient across the aortic valve and no MR.  Aortic pressure was in the 180/70 range with LV pressure in the 180/28 range.  Left subclavian and internal mammary artery projections shows it to be widely patent and a good vessel for bypass.  IMPRESSION:  The patient will be started on oxygen as well as heparin and nitroglycerin.  CVTS has been called and hopefully we can operate on the patient within the next 24 to 36 hours. Dictated  by:   Noralyn Pick Eden Emms, M.D. [facility] Attending Physician:  Tressie Stalker DD:  07/25/01 TD:  07/29/01 Job: [id number]

## 2010-06-11 NOTE — Consult Note (Signed)
[place]. [facility]  Patient:    ERHARD, SENSKE Visit Number: [id number] MRN: [id number]          Service Type: SUR Location: 2000 2040 01 Attending Physician:  Tressie Stalker Dictated by:   Salvatore Decent. Cornelius Moras, M.D. Proc. Date: 07/25/01 Admit Date:  07/25/2001   CC:         Theron Arista C. Eden Emms, M.D. [facility]  Gaynelle Cage, M.D. at [facility] in [place]  CVTS Office   Consultation Report  REQUESTING PHYSICIAN:  Noralyn Pick. Eden Emms, M.D.  PRIMARY CARE PHYSICIAN:  Dr. Gaynelle Cage, [facility].  REASON FOR CONSULTATION:  Left main disease with new onset exertional angina.  HISTORY OF PRESENT ILLNESS:  The patient is a previously healthy 77 year old gentleman from [place], [place] with no previous cardiac history and remarkably few risk factors. He presents with a six-month history of classical symptoms of exertional angina. His symptoms began last Fall while he was mowing the lawn. They abated over the Winter months but have resumed this Spring and Summer with resumption of mowing the lawn and other strenuous physical activity. The patient describes classical episodes of severe diffuse substernal chest pain occurring with exertion and always relieved by rest. He denies any associated nausea, vomiting, diaphoresis, or shortness of breath. He denies any episodes of chest pain occurring at rest or with minimal activity. His last episode of chest pain was two weeks ago while he was mowing the lawn. He was referred to Dr. Eden Emms and underwent elective cardiac catheterization today. This demonstrates critical 95% ostial stenosis of the left main coronary artery. There is otherwise insignificant coronary artery disease and normal left ventricular function. Cardiac surgical consultation is requested.  REVIEW OF SYSTEMS:  GENERAL:  The patient reports feeling well with normal appetite and no recent weight gain or weight loss.  RESPIRATORY:  Negative. The patient reports only occasional dry nonproductive cough. He denies any productive cough, wheezing, or hemoptysis. GASTROINTESTINAL:  Negative. The patient specifically denies any problems with dysphagia, odynophagia, hematochezia, hematemesis, melena, constipation, or diarrhea. CARDIAC: Notable for exertional angina as described. The patient also denies any history of palpitations, syncope, or near syncopal episodes. He has not had any problems with orthopnea or lower extremity edema. NEUROLOGICAL:  Negative. The patient specifically denies any episodes of transient monocular blindness or transient numbness or weakness involving either upper or lower extremity. MUSCULOSKELETAL:  Notable for occasional pain in both knees related to degenerative arthritis. GENITOURINARY:  Negative. INFECTIOUS:  Negative. HEMATOLOGIC:  Negative. ENDOCRINE:  Negative. PSYCHIATRIC:  Negative. PERIPHERAL VASCULAR:  Negative. The patient denies symptoms of claudication. HEENT:  Negative. The patient does wear glasses and has partial upper and lower dentures.  PAST MEDICAL HISTORY:  Notable for the absence of any history of hypertension, hyperlipidemia, diabetes, previous stroke, or any other cardiac disease.  PAST SURGICAL HISTORY:  Notable for numerous minor injuries in the past including fracture of the left scapula and left clavicle as well as multiple right-sided rib fractures all related to falls in the past. 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  FAMILY HISTORY:  Notable in that his mother died with heart disease in her 71s.  SOCIAL HISTORY:  The patient lives with his wife and remains very active physically. He is retired from [employer], [employer] but remains active doing gardening and a lot of woodworking and other carpentry. He is a nonsmoker and smoked only briefly for  15 years when he was young. He quit smoking at age  49. He denies any alcohol consumption.  CURRENT MEDICATIONS: 1. Aspirin. 2. Toprol-XL 12.5 mg once daily. 3. Imdur 30 mg once daily. 4. Plavix 75 mg once daily.  These medications were all begun on June 27th.  ALLERGIES:  The patient denies any known drug allergies or sensitivities.  PHYSICAL EXAMINATION:  GENERAL:  A well-appearing white male who appears somewhat younger than stated age in no acute distress.  VITAL SIGNS:  He is in normal sinus rhythm and remains normotensive and afebrile at present.  HEENT:  Within normal limits.  NECK:  Supple. There are no cervical or supraclavicular lymphadenopathy. There is no jugular venous distention. No carotid bruits are noted.  CHEST:  Auscultation of the chest reveals clear and symmetrical breath sounds bilaterally. No wheezes or rhonchi are noted.  CARDIOVASCULAR:  Demonstrates a regular rate and rhythm. No murmurs, rubs or gallops are demonstrated.  ABDOMEN:  Soft, and nontender. There are no palpable masses. The liver edge is not enlarged. Bowel sounds are present.  EXTREMITIES:  Warm and well perfused. There is no lower extremity edema. Distal pulses are intact in both lower legs at the ankle.  NEUROLOGICAL:  Grossly nonfocal and symmetrical throughout.  RECTAL AND GENITOURINARY:  Both deferred.  SKIN:  Clean and dry and healthy-appearing throughout.  DIAGNOSTIC DATA:  Cardiac catheterization performed by Dr. Eden Emms is reviewed today. This demonstrates 95% ostial stenosis of the left main coronary artery. There is right dominant coronary circulation. There is insignificant nonobstructive plaque felt to be seen elsewhere in the coronary arteries. There is normal left ventricular function with ejection fraction estimated 65-70%.  IMPRESSION:  Critical left main disease with classical symptoms of class II  exertional angina. I believe that the patient would best be treated with elective coronary artery bypass  grafting. We plan surgery first case tomorrow morning with bilateral internal mammary artery grafts.  PLAN:  I have outlined at length the indications and potential benefits of coronary artery bypass grafting with the patient and his family. All of their questions have been addressed. Alternative treatment strategies have been discussed. They understand and accept all associated risks of surgery including but not limited to risk of death, stroke, myocardial infarction, bleeding requiring blood transfusion, arrhythmia, infection, and recurrent coronary artery disease. They understand the somewhat increased risk of bleeding or need for transfusion of blood products due to the patients recent therapy with Plavix. However, due to the critical nature of the left main coronary disease, I do not feel that it would be best to wait several days to allow the effects of Plavix to dissipate. Rather we should proceed with surgery now and accept the somewhat increased risks of bleeding complications. There is also a slightly increased risk of stroke due to the patients newly diagnosed asymptomatic bilateral internal carotid artery stenosis. He was documented to have 60-80% stenosis in the right internal carotid artery and 40-60% internal carotid artery stenosis on the left side on routine duplex scan today. Dictated by:   Salvatore Decent Cornelius Moras, M.D. Attending Physician:  Tressie Stalker DD:  07/25/01 TD:  07/28/01 Job: [id number]

## 2010-06-11 NOTE — Op Note (Signed)
[place]  Patient:    Danny [name], Danny [name] Visit Number: [id number] MRN: [id number]          Service Type: SUR Location: 2000 [id number] [id number] Attending Physician:  Tressie Stalker Dictated by:   Salvatore Decent. Cornelius Moras, M.D. Proc. Date: 07/26/01 Admit Date:  07/25/2001   CC:         Theron Arista C. Eden Emms, M.D. 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  POSTOPERATIVE DIAGNOSIS:  Critical left main disease with class II exertional angina.  PROCEDURE:  Median sternotomy for coronary artery bypass grafting x3 (left internal mammary artery to distal left anterior descending coronary artery, right internal mammary artery to ramus intermediate branch, saphenous vein graft to third circumflex marginal branch).  SURGEON:  Salvatore Decent. Cornelius Moras, M.D.  ASSISTANT:  Sherrie Pablo, P.A.  ANESTHESIA:  General.  BRIEF CLINICAL NOTE:  The patient is a 77 year old white male with unremarkable past medical history, who presents initially to Dr. Gaynelle Cage at [facility] in [place] with symptoms of exertional angina.  The patient was referred to [facility] C. Eden Emms, M.D., and ultimately underwent elective cardiac catheterization on July 2.  This demonstrates critical ostial left main coronary artery stenosis with normal left ventricular function.  A full consultation note has been dictated previously. The patient has provided full informed consent and desires to proceed with surgical intervention as described.  DESCRIPTION OF PROCEDURE:  The patient is brought to the operating room on the above-mentioned date, and central monitoring is established by the anesthesia service under the care and direction of [employer]. Zoila Shutter, M.D. Specifically, a Swan-Ganz catheter is placed through the right internal jugular approach.  A radial arterial line  is placed.  Intravenous antibiotics are administered.  The patient is placed in the supine position on the operating table.  Following the induction of general endotracheal anesthesia, a Foley catheter is placed.  The patients chest, abdomen, both groins, and both lower extremities are prepared and draped in a sterile manner.  A median sternotomy incision is performed, and the left internal mammary artery is dissected from the chest wall and prepared for bypass grafting.  The left internal mammary artery is notably good-quality conduit.  Subsequently the right internal mammary artery is dissected from the chest wall and also prepared for bypass grafting.  The vessel was notably good-quality conduit. The patient is heparinized systemically.  The pericardium is opened.  The ascending aorta is normal in appearance.  The ascending aorta and the right atrium are cannulated for cardiopulmonary bypass.  Adequate heparinization is verified.  Cardiopulmonary bypass is begun, and the surface of the heart is inspected.  The patients coronary anatomy is notable in that there is a large intermediate branch vessel which seems to supply the majority of the anterior wall of the left ventricle. There are several very small circumflex marginal branches which are not graftable.  There is a single third circumflex marginal branch which is somewhat larger in caliber.  These vessels were not well-visualized at the time of catheterization; however, separate grafting is felt to be indicated for the third circumflex marginal branch because of its relative size and because of no other suitable branches off the distal left circumflex coronary artery for grafting.  Subsequently, a segment of saphenous vein is obtained from the patients right lower leg through a longitudinal incision.  The saphenous vein is  notably good-quality conduit.  A cardioplegia catheter is placed in the ascending aorta.  A temperature probe  is placed in the left ventricular septum.  The patient is cooled to 32 degrees systemic temperature.  The aortic crossclamp is applied and cardioplegia is delivered in antegrade fashion through the aortic root.  Iced saline slush is applied for topical hypothermia.  The initial cardioplegic arrest and myocardial cooling are felt to be excellent.  Repeat doses of cardioplegia are administered intermittently throughout the crossclamp portion of the operation both through the aortic root and down the subsequently-placed vein graft to maintain septal temperature below 15 degrees Centigrade.  The following distal coronary anastomoses are performed:  (1) The third circumflex marginal branch is grafted with a saphenous vein graft in an end-to-side fashion.  This coronary measures 1.3 mm in diameter and is of good quality.  (2) The ramus intermediate branch is grafted with the right internal mammary artery in an end-to-side fashion.  This graft is utilized in-situ with the right internal mammary artery pedicle placed posterior to the ascending aorta through the transverse sinus.  The ramus intermediate branch is a 1.8 mm vessel and is of good quality.  (3) The distal left anterior descending coronary artery is grafted with the left internal mammary artery in an end-to-side fashion.  This coronary measures 1.7 mm in diameter and is of good quality.  The single proximal saphenous vein anastomosis is performed to the ascending aorta prior to removal of the aortic crossclamp.  The septal temperature is noted to rise rapidly and dramatically upon reperfusion of the left internal mammary artery graft.  The heart begins to beat spontaneously.  The aortic crossclamp is removed after a total crossclamp time of 53 minutes.  All proximal and distal anastomoses are inspected for hemostasis and appropriate graft orientation.  Epicardial pacing wires are affixed to the right ventricular outflow tract and to  the right atrial appendage.  The  patient is rewarmed to greater than 37 degrees Centigrade temperature.  The patient is weaned from cardiopulmonary bypass without difficulty.  The patients rhythm at separation from bypass is normal sinus rhythm.  Atrial pacing is employed to increase heart rate.  No inotropic support is required. Total cardiopulmonary bypass time for the operation is 85 minutes.  The venous and arterial cannulae are removed uneventfully.  Protamine is administered to reverse the anticoagulation.  The mediastinum and both left and right pleural spaces are irrigated with saline solution containing vancomycin.  Meticulous surgical hemostasis is ascertained.  There is moderate coagulopathy which is attributed to the patients preoperative therapy with Plavix.  Satisfactory hemostasis is achieved.  The mediastinum and the left and right pleural spaces are drained with four chest tubes placed through separate stab incisions inferiorly.  The median sternotomy is closed in a routine fashion.  The right lower extremity incision is closed in multiple layers in the routine fashion.  All skin incisions are closed with subcuticular skin closures.  The patient tolerated the procedure well and is transported to the surgical intensive care unit in stable condition.  There are no intraoperative complications.  All sponge, instrument, and needle counts are verified correct at the completion of the operation.  No blood products were administered. Dictated by:   Salvatore Decent Cornelius Moras, M.D. Attending Physician:  Tressie Stalker DD:  07/26/01 TD:  07/30/01 Job: [id number]

## 2010-07-29 ENCOUNTER — Encounter: Payer: Self-pay | Admitting: Cardiovascular Disease

## 2010-07-29 ENCOUNTER — Ambulatory Visit (INDEPENDENT_AMBULATORY_CARE_PROVIDER_SITE_OTHER): Payer: Medicare HMO | Admitting: Cardiovascular Disease

## 2010-07-29 VITALS — BP 104/63 | HR 64 | Resp 18 | Ht 74.0 in | Wt 161.4 lb

## 2010-07-29 DIAGNOSIS — I10 Essential (primary) hypertension: Secondary | ICD-10-CM

## 2010-07-29 DIAGNOSIS — E7800 Pure hypercholesterolemia, unspecified: Secondary | ICD-10-CM

## 2010-07-29 DIAGNOSIS — I2510 Atherosclerotic heart disease of native coronary artery without angina pectoris: Secondary | ICD-10-CM

## 2010-07-29 NOTE — Progress Notes (Signed)
Danny [name] is seen today for distant CABG, 2003 carotid disease with left CEA and right subclavian bypass. in 2009 He has HTN and elevated lipids. last visit he had had a syncopal episode. There was no cardiac etiology found. He has great exercise tolerance with no angina. He has improved strength in his right arm and no TIA symptoms. He has residual right carotid disease.  Reviewed last duplex from 01/08/10 and right subclavian bypass ok as is left CEA.  60-79% RICA stenosis with velocities of 2.63/47    Compliant with meds Labs reviewed from [facility] 02/03/10 ok except mild elevation in ALT/SGPT 79 ( 0-65)  ROS: Denies fever, malais, weight loss, blurry vision, decreased visual acuity, cough, sputum, SOB, hemoptysis, pleuritic pain, palpitaitons, heartburn, abdominal pain, melena, lower extremity edema, claudication, or rash.  All other systems reviewed and negative  General: Affect appropriate Healthy:  appears stated age HEENT: normal Neck supple with no adenopathy JVP normal rigth subclavian and carotid  Bruits Left CEA no thyromegaly Lungs clear with no wheezing and good diaphragmatic motion Heart:  S1/S2 no murmur,rub, gallop or click PMI normal Abdomen: benighn, BS positve, no tenderness, no AAA no bruit.  No HSM or HJR Distal pulses intact with no bruits No edema Neuro non-focal Skin warm and dry No muscular weakness   Current Outpatient Prescriptions  Medication Sig Dispense Refill  . ALPRAZolam (XANAX) 0.25 MG tablet Take 0.25 mg by mouth at bedtime as needed.        [name] aspirin 325 MG EC tablet Take 325 mg by mouth daily.        . B Complex-C-Folic Acid (MULTIVITAMIN, STRESS FORMULA) tablet Take 1 tablet by mouth daily.        [name] lovastatin (MEVACOR) 40 MG tablet Take 40 mg by mouth at bedtime.        . metoprolol (LOPRESSOR) 50 MG tablet Take 50 mg by mouth 2 (two) times daily. 1/2 TAB BID        . niacin (NIASPAN) 1000 MG CR tablet Take 1,000 mg by mouth at bedtime.        .  Omega-3 Fatty Acids (FISH OIL) 1000 MG CAPS Take 2 capsules by mouth daily.       . valsartan (DIOVAN) 160 MG tablet Take 160 mg by mouth daily.          Allergies  Lunesta  Electrocardiogram:  Assessment and Plan

## 2010-07-29 NOTE — Assessment & Plan Note (Signed)
F/U carotid duplex in December.  No TIA;s and good right arm strength

## 2010-07-29 NOTE — Assessment & Plan Note (Signed)
Very active mowing yards and walking with no angina.  No indication for functional study at this time

## 2010-07-29 NOTE — Assessment & Plan Note (Signed)
Well controlled.  Continue current medications and low sodium Dash type diet.    

## 2010-07-29 NOTE — Patient Instructions (Signed)
Your physician recommends that you schedule a follow-up appointment in: 1 YEAR Your physician has requested that you have a carotid duplex. This test is an ultrasound of the carotid arteries in your neck. It looks at blood flow through these arteries that supply the brain with blood. Allow one hour for this exam. There are no restrictions or special instructions.  IN December IN [place] OFFICE

## 2010-07-29 NOTE — Assessment & Plan Note (Signed)
Cholesterol is at goal.  Continue current dose of statin and diet Rx.  No myalgias or side effects.  F/U  LFT's in 6 months. No results found for this basename: LDLCALC   Mild elevation in SGPT  F/U Dr Christell Constant

## 2010-10-13 ENCOUNTER — Encounter: Payer: Self-pay | Admitting: Cardiovascular Disease

## 2010-10-21 LAB — POCT I-STAT, CHEM 8
Glucose, Bld: 116 — ABNORMAL HIGH
HCT: 50
Hemoglobin: 17
Potassium: 5.1
Sodium: 136

## 2010-10-22 LAB — COMPREHENSIVE METABOLIC PANEL
ALT: 11
AST: 23
Alkaline Phosphatase: 54
Glucose, Bld: 91
Potassium: 4.2
Sodium: 137
Total Protein: 6.5

## 2010-10-22 LAB — PROTIME-INR
INR: 0.9
Prothrombin Time: 12.3

## 2010-10-22 LAB — CBC
Hemoglobin: 13.5
Hemoglobin: 15.4
Platelets: 222
RBC: 4.65
RDW: 12.8
RDW: 13
WBC: 10.3

## 2010-10-22 LAB — BASIC METABOLIC PANEL
Calcium: 8.6
GFR calc non Af Amer: >60
Glucose, Bld: 96
Potassium: 3.8
Sodium: 136

## 2010-10-22 LAB — APTT: aPTT: 27

## 2010-10-22 LAB — URINALYSIS, ROUTINE W REFLEX MICROSCOPIC
Protein, ur: NEGATIVE
Urobilinogen, UA: 0.2

## 2010-10-22 LAB — TYPE AND SCREEN: ABO/RH(D): A NEG

## 2010-10-25 LAB — COMPREHENSIVE METABOLIC PANEL
ALT: 19
Alkaline Phosphatase: 51
BUN: 8
CO2: 29
GFR calc non Af Amer: >60
Glucose, Bld: 107 — ABNORMAL HIGH
Potassium: 4.4
Sodium: 137

## 2010-10-25 LAB — URINALYSIS, ROUTINE W REFLEX MICROSCOPIC
Bilirubin Urine: NEGATIVE
Glucose, UA: NEGATIVE
Hgb urine dipstick: NEGATIVE
Ketones, ur: NEGATIVE
Protein, ur: NEGATIVE

## 2010-10-25 LAB — PROTIME-INR: Prothrombin Time: 12.7

## 2010-10-25 LAB — CBC
HCT: 44.3
Hemoglobin: 15
MCHC: 33.8
RBC: 4.42

## 2010-10-26 LAB — BASIC METABOLIC PANEL
CO2: 26
Calcium: 8.7
Chloride: 101
GFR calc Af Amer: >60
Potassium: 3.4 — ABNORMAL LOW
Sodium: 134 — ABNORMAL LOW

## 2010-10-26 LAB — CBC
Hemoglobin: 13.1
MCHC: 34.4
MCV: 98.8
RBC: 3.84 — ABNORMAL LOW

## 2011-02-03 ENCOUNTER — Encounter: Payer: Self-pay | Admitting: Cardiovascular Disease

## 2011-04-28 ENCOUNTER — Encounter: Payer: Self-pay | Admitting: Cardiovascular Disease

## 2011-08-02 ENCOUNTER — Encounter: Payer: Self-pay | Admitting: Cardiovascular Disease

## 2011-08-02 ENCOUNTER — Ambulatory Visit (INDEPENDENT_AMBULATORY_CARE_PROVIDER_SITE_OTHER): Payer: Medicare HMO | Admitting: Cardiovascular Disease

## 2011-08-02 VITALS — BP 110/57 | HR 60 | Ht 74.0 in | Wt 154.1 lb

## 2011-08-02 DIAGNOSIS — I10 Essential (primary) hypertension: Secondary | ICD-10-CM

## 2011-08-02 DIAGNOSIS — I25810 Atherosclerosis of coronary artery bypass graft(s) without angina pectoris: Secondary | ICD-10-CM

## 2011-08-02 MED ORDER — NITROGLYCERIN 0.4 MG SL SUBL: 0.4000 mg | SUBLINGUAL_TABLET | SUBLINGUAL | Status: DC | PRN

## 2011-08-02 NOTE — Patient Instructions (Signed)
**Note De-Identified  Obfuscation** Your physician has recommended you make the following change in your medication: Start taking Nitroglycerin as needed for chest pain and discomfort  Your physician has requested that you have a carotid duplex. This test is an ultrasound of the carotid arteries in your neck. It looks at blood flow through these arteries that supply the brain with blood. Allow one hour for this exam. There are no restrictions or special instructions.   Your physician recommends that you schedule a follow-up appointment in: 6 months

## 2011-08-02 NOTE — Progress Notes (Signed)
Patient ID: Danny [name], male   DOB: [birth year], 78 y.o.   MRN: [id number] Danny [name] is seen today for distant CABG, 2003 carotid disease with left CEA and right subclavian bypass. in 2009 He has HTN and elevated lipids. last visit he had had a syncopal episode. There was no cardiac etiology found. He has great exercise tolerance with no angina. He has improved strength in his right arm and no TIA symptoms. He has residual right carotid disease. Reviewed last duplex from 01/08/10 and right subclavian bypass ok as is left CEA. 60-79% RICA stenosis with velocities of 2.63/47  Compliant with meds  Labs reviewed from [facility] 02/03/10 ok except mild elevation in ALT/SGPT 79 ( 0-65)  Due to have labs next month.  Needs nitro  ROS: Denies fever, malais, weight loss, blurry vision, decreased visual acuity, cough, sputum, SOB, hemoptysis, pleuritic pain, palpitaitons, heartburn, abdominal pain, melena, lower extremity edema, claudication, or rash.  All other systems reviewed and negative  General: Affect appropriate Healthy:  appears stated age HEENT: normal Neck supple with no adenopathy JVP normal bilateral subclavian bruits and right carotid bruit no thyromegaly Lungs clear with no wheezing and good diaphragmatic motion Heart:  S1/S2 no murmur, no rub, gallop or click PMI normal Abdomen: benighn, BS positve, no tenderness, no AAA no bruit.  No HSM or HJR Distal pulses intact with no bruits No edema Neuro non-focal Skin warm and dry No muscular weakness   Current Outpatient Prescriptions  Medication Sig Dispense Refill  . ALPRAZolam (XANAX) 0.25 MG tablet Take 0.25 mg by mouth at bedtime as needed.        [name] aspirin 325 MG EC tablet Take 325 mg by mouth daily.        . Cholecalciferol (VITAMIN D-3 PO) Take by mouth.      . lovastatin (MEVACOR) 40 MG tablet Take 40 mg by mouth at bedtime.        . metoprolol (LOPRESSOR) 50 MG tablet Take 50 mg by mouth 2 (two) times daily. 1/2 TAB BID        .  Multiple Vitamin (MULTIVITAMIN) tablet Take 1 tablet by mouth daily.      . niacin (NIASPAN) 1000 MG CR tablet Take 1,000 mg by mouth at bedtime.        . Omega-3 Fatty Acids (FISH OIL) 1000 MG CAPS Take 2 capsules by mouth daily.       [name] omeprazole (PRILOSEC OTC) 20 MG tablet Take 20 mg by mouth daily.      . valsartan (DIOVAN) 160 MG tablet Take 160 mg by mouth daily.          Allergies  Eszopiclone  Electrocardiogram:  NSR rate 60 sinus arrhythmia no acute changes.   Assessment and Plan

## 2011-08-02 NOTE — Assessment & Plan Note (Signed)
Stable with no angina and good activity level.  Continue medical Rx Nitro called into the [facility] in [place]

## 2011-08-02 NOTE — Assessment & Plan Note (Signed)
Well controlled.  Continue current medications and low sodium Dash type diet.    

## 2011-08-02 NOTE — Assessment & Plan Note (Signed)
Cholesterol is at goal.  Continue current dose of statin and diet Rx.  No myalgias or side effects.  F/U  LFT's in 6 months. No results found for this basename: LDLCALC   Labs with Danny [name] next month

## 2011-08-02 NOTE — Assessment & Plan Note (Signed)
ASA  Needs F/U duplex for R ICA stenosis. And previous left CEA and right subclavian bypass.  No arm weakness and no TIA;s

## 2011-08-11 ENCOUNTER — Other Ambulatory Visit: Payer: Self-pay | Admitting: Cardiology

## 2011-08-11 DIAGNOSIS — R0989 Other specified symptoms and signs involving the circulatory and respiratory systems: Secondary | ICD-10-CM

## 2011-08-15 ENCOUNTER — Encounter (INDEPENDENT_AMBULATORY_CARE_PROVIDER_SITE_OTHER): Payer: Medicare HMO

## 2011-08-15 DIAGNOSIS — I6529 Occlusion and stenosis of unspecified carotid artery: Secondary | ICD-10-CM

## 2011-08-15 DIAGNOSIS — R0989 Other specified symptoms and signs involving the circulatory and respiratory systems: Secondary | ICD-10-CM

## 2011-08-23 ENCOUNTER — Telehealth: Payer: Self-pay | Admitting: *Deleted

## 2011-08-23 NOTE — Telephone Encounter (Signed)
LEFT MESSAGE FOR PT TO CALL BACK  RE CAROTID RESULTS .Zack Seal

## 2011-08-24 NOTE — Telephone Encounter (Signed)
PT AWARE OF CAROTID RESULTS  60-70% RICA  REPEAT CAROTID IN  6  MONTHS .Zack Seal

## 2011-09-13 ENCOUNTER — Encounter: Payer: Self-pay | Admitting: Cardiovascular Disease

## 2012-02-08 ENCOUNTER — Encounter: Payer: Self-pay | Admitting: Cardiology

## 2012-04-04 ENCOUNTER — Ambulatory Visit (INDEPENDENT_AMBULATORY_CARE_PROVIDER_SITE_OTHER): Payer: Medicare HMO | Admitting: Cardiovascular Disease

## 2012-04-04 ENCOUNTER — Encounter: Payer: Self-pay | Admitting: Cardiovascular Disease

## 2012-04-04 DIAGNOSIS — I6529 Occlusion and stenosis of unspecified carotid artery: Secondary | ICD-10-CM

## 2012-04-04 NOTE — Progress Notes (Signed)
Patient ID: Danny [name], male   DOB: 24-Apr-1931, 79 y.o.   MRN: [id number] Danny [name] is seen today for distant CABG, 2003 carotid disease with left CEA and right subclavian bypass. in 2009 He has HTN and elevated lipids. last visit he had had a syncopal episode. There was no cardiac etiology found. He has great exercise tolerance with no angina. He has improved strength in his right arm and no TIA symptoms. He has residual right carotid disease. Reviewed last duplex from 01/09/12  and right subclavian bypass ok as is left CEA. 60-79% RICA stenosis with with systolic velocity over 33m/sec needs f/u  Compliant with meds  Labs reviewed from [facility] 02/03/10 ok except mild elevation in ALT/SGPT 79 ( 0-65) Due to have labs next month. Needs nitro   ROS: Denies fever, malais, weight loss, blurry vision, decreased visual acuity, cough, sputum, SOB, hemoptysis, pleuritic pain, palpitaitons, heartburn, abdominal pain, melena, lower extremity edema, claudication, or rash.  All other systems reviewed and negative  General: Affect appropriate Healthy:  appears stated age HEENT: normal Neck supple with no adenopathy JVP normal bilateral bruits no thyromegaly Lungs clear with no wheezing and good diaphragmatic motion Heart:  S1/S2 no murmur, no rub, gallop or click PMI normal Abdomen: benighn, BS positve, no tenderness, no AAA no bruit.  No HSM or HJR Distal pulses intact with no bruits No edema Neuro non-focal Skin warm and dry No muscular weakness   Current Outpatient Prescriptions  Medication Sig Dispense Refill  . ALPRAZolam (XANAX) 0.25 MG tablet Take 0.25 mg by mouth at bedtime as needed.        [name] aspirin 325 MG EC tablet Take 325 mg by mouth daily.        . Cholecalciferol (VITAMIN D-3 PO) Take by mouth.      . lovastatin (MEVACOR) 40 MG tablet Take 40 mg by mouth at bedtime.        . metoprolol (LOPRESSOR) 50 MG tablet Take 50 mg by mouth 2 (two) times daily. 1/2 TAB BID        . Multiple Vitamin  (MULTIVITAMIN) tablet Take 1 tablet by mouth daily.      . niacin (NIASPAN) 1000 MG CR tablet Take 1,000 mg by mouth at bedtime.        . nitroGLYCERIN (NITROSTAT) 0.4 MG SL tablet Place 1 tablet (0.4 mg total) under the tongue every 5 (five) minutes as needed for chest pain.  25 tablet  3  . Omega-3 Fatty Acids (FISH OIL) 1000 MG CAPS Take 2 capsules by mouth daily.       [name] omeprazole (PRILOSEC OTC) 20 MG tablet Take 20 mg by mouth daily.      . valsartan (DIOVAN) 160 MG tablet Take 160 mg by mouth daily.         No current facility-administered medications for this visit.    Allergies  Eszopiclone  Electrocardiogram: 08/02/11 SR rate 60 anterior MI  Assessment and Plan

## 2012-04-04 NOTE — Assessment & Plan Note (Signed)
Well controlled.  Continue current medications and low sodium Dash type diet.    

## 2012-04-04 NOTE — Assessment & Plan Note (Signed)
Stable with no angina and good activity level.  Continue medical Rx  

## 2012-04-04 NOTE — Patient Instructions (Addendum)
Your physician has requested that you have a carotid duplex. This test is an ultrasound of the carotid arteries in your neck. It looks at blood flow through these arteries that supply the brain with blood. Allow one hour for this exam. There are no restrictions or special instructions.  Your physician recommends that you schedule a follow-up appointment in: 6 months with Dr. Eden Emms  Continue taking your medications as prescribed

## 2012-04-04 NOTE — Assessment & Plan Note (Signed)
Cholesterol is at goal.  Continue current dose of statin and diet Rx.  No myalgias or side effects.  F/U  LFT's in 6 months. No results found for this basename: LDLCALC   Labs with [facility]  He indicates LFT;s ok

## 2012-04-04 NOTE — Assessment & Plan Note (Signed)
No arm weakness bypass working well Has significant residual carotid disease on right and needs f/u duplex.  Will do in [place] Since surgical decision may be needed

## 2012-05-08 ENCOUNTER — Telehealth: Payer: Self-pay | Admitting: *Deleted

## 2012-05-08 NOTE — Telephone Encounter (Signed)
Called pt to advise apt date/time location at [address] office, pt understood and accepted apt per noted no other apts for that date, pt will attend

## 2012-05-08 NOTE — Telephone Encounter (Signed)
Message copied by Ovidio Kin on Tue May 08, 2012 11:04 AM ------      Message from: Ashley Akin      Created: Tue May 08, 2012 10:34 AM      Regarding: RE: pt needs to reschedule c doppler       Patient rescheduled for 05/17/12 @ 11:00 @ [address] office.  Will you please advise patient of this appointment.            Thanks,      terry      ----- Message -----         From: Ovidio Kin, LPN         Sent: 05/07/2012   4:52 PM           To: Ashley Akin      Subject: pt needs to reschedule c doppler                         Pt can 04-10-12 c doppler       ------

## 2012-05-17 ENCOUNTER — Encounter (INDEPENDENT_AMBULATORY_CARE_PROVIDER_SITE_OTHER): Payer: Medicare HMO

## 2012-05-17 DIAGNOSIS — I6529 Occlusion and stenosis of unspecified carotid artery: Secondary | ICD-10-CM

## 2012-05-25 ENCOUNTER — Other Ambulatory Visit (INDEPENDENT_AMBULATORY_CARE_PROVIDER_SITE_OTHER): Payer: Medicare HMO

## 2012-05-25 DIAGNOSIS — E785 Hyperlipidemia, unspecified: Secondary | ICD-10-CM

## 2012-05-25 DIAGNOSIS — I10 Essential (primary) hypertension: Secondary | ICD-10-CM

## 2012-05-25 DIAGNOSIS — R5383 Other fatigue: Secondary | ICD-10-CM

## 2012-05-25 DIAGNOSIS — E559 Vitamin D deficiency, unspecified: Secondary | ICD-10-CM

## 2012-05-25 DIAGNOSIS — R5381 Other malaise: Secondary | ICD-10-CM

## 2012-05-25 LAB — BASIC METABOLIC PANEL WITH GFR
BUN: 9 mg/dL (ref 6–23)
Chloride: 97 mEq/L (ref 96–112)
GFR, Est Non African American: 83 mL/min
Potassium: 4.1 mEq/L (ref 3.5–5.3)

## 2012-05-25 LAB — POCT CBC
Granulocyte percent: 64.9 %G (ref 37–80)
Lymph, poc: 1.2 (ref 0.6–3.4)
MCV: 99.6 fL — AB (ref 80–97)
MPV: 7.5 fL (ref 0–99.8)
Platelet Count, POC: 202 10*3/uL (ref 142–424)
RBC: 4.4 M/uL — AB (ref 4.69–6.13)

## 2012-05-25 LAB — HEPATIC FUNCTION PANEL
Alkaline Phosphatase: 81 U/L (ref 39–117)
Indirect Bilirubin: 0.4 mg/dL (ref 0.0–0.9)
Total Bilirubin: 0.6 mg/dL (ref 0.3–1.2)

## 2012-05-25 NOTE — Progress Notes (Signed)
Patient came in for labs only.

## 2012-05-29 LAB — NMR LIPOPROFILE WITH LIPIDS
Cholesterol, Total: 116 mg/dL (ref <200)
HDL Particle Number: 33.9 umol/L (ref >=30.5)
HDL-C: 63 mg/dL (ref >=40)
LDL (calc): 42 mg/dL (ref <100)
LP-IR Score: 31 (ref <=45)
Triglycerides: 53 mg/dL (ref <150)

## 2012-05-30 ENCOUNTER — Encounter: Payer: Self-pay | Admitting: Family Medicine

## 2012-05-30 ENCOUNTER — Ambulatory Visit (INDEPENDENT_AMBULATORY_CARE_PROVIDER_SITE_OTHER): Payer: Medicare HMO | Admitting: Family Medicine

## 2012-05-30 VITALS — BP 115/62 | HR 53 | Temp 97.7°F | Ht 72.0 in | Wt 156.0 lb

## 2012-05-30 DIAGNOSIS — R0989 Other specified symptoms and signs involving the circulatory and respiratory systems: Secondary | ICD-10-CM

## 2012-05-30 DIAGNOSIS — I10 Essential (primary) hypertension: Secondary | ICD-10-CM

## 2012-05-30 DIAGNOSIS — E7800 Pure hypercholesterolemia, unspecified: Secondary | ICD-10-CM

## 2012-05-30 NOTE — Patient Instructions (Addendum)
Fall precautions discussed Continue current meds and therapeutic lifestyle changes Nasacort AQ over-the-counter 1-2 sprays each nostril daily, he can try this for allergic rhinitis

## 2012-05-30 NOTE — Progress Notes (Signed)
  Subjective:    Patient ID: Danny [name], male    DOB: [birth year], 79 y.o.   MRN: [id number]  HPI This patient presents for recheck of multiple medical problems. No one accompanies the patient today.  Patient Active Problem List   Diagnosis Date Noted  . Vitamin D deficiency 05/18/2010  . Hyperplasia of prostate 05/18/2010  . Carotid stenosis 05/18/2010  . CAROTID BRUIT 01/06/2010  . HYPERCHOLESTEROLEMIA  IIA 05/19/2008  . SYNCOPE-CAROTID SINUS 05/19/2008  . BLURRED VISION 05/19/2008  . HYPERTENSION, UNSPECIFIED 05/19/2008  . CAD, ARTERY BYPASS GRAFT 05/19/2008  . 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 He had carotid Dopplers recently and they will be repeated in one year. He also sees Dr. Juliann Pares and we'll see him again in about a year.  The allergies, current medications, past medical history, surgical history, family and social history are reviewed. Recent labs were also reviewed with patient.  Immunizations reviewed.  Health maintenance reviewed.  The following items are outstanding: None      Review of Systems  Constitutional: Positive for fatigue.  HENT: Positive for sneezing and postnasal drip (due to allergies).   Eyes: Positive for itching (due to allergies).  Respiratory: Negative.   Cardiovascular: Negative.   Gastrointestinal: Positive for constipation (occasional).  Endocrine: Negative.   Genitourinary: Negative.   Musculoskeletal: Positive for back pain (occasional LBP).  Allergic/Immunologic: Positive for environmental allergies (seasonal).  Neurological: Negative.   Psychiatric/Behavioral: Positive for sleep disturbance (occasional ).       Objective:   Physical Exam BP 115/62  Pulse 53  Temp(Src) 97.7 F (36.5 C) (Oral)  Ht 6' (1.829 m)  Wt 156 lb (70.761 kg)  BMI 21.15 kg/m2  The patient appeared well nourished and normally developed, alert and oriented to time and place. Speech, behavior and judgement appear  normal. Vital signs as documented.  Head exam is unremarkable. No scleral icterus or pallor noted. Ears and throat are normal.  He does have carotid bruits right greater than left,. Carotid upstrokes are brisk bilaterally. No cervical adenopathy. There is no thyroid enlargement and no lymph nodes bilaterally. Lungs are clear anteriorly and posteriorly to auscultation. Normal respiratory effort. Cardiac exam reveals regular  rhythm. The heart rate was 48 per minute. First and second heart sounds normal.  No murmurs, rubs or gallops.  Abdominal exam reveals normal bowl sounds, no masses, no organomegaly and no aortic enlargement. No inguinal adenopathy. Extremities are nonedematous and both femoral and pedal pulses are normal. Skin without pallor or jaundice.  Warm and dry, without rash. Neurologic exam reveals normal deep tendon reflexes and normal sensation.          Assessment & Plan:  CAROTID BRUIT  HYPERCHOLESTEROLEMIA  IIA  HYPERTENSION, UNSPECIFIED  AAA (abdominal aortic aneurysm) without rupture  Patient Instructions  Fall precautions discussed Continue current meds and therapeutic lifestyle changes Nasacort AQ over-the-counter 1-2 sprays each nostril daily, he can try this for allergic rhinitis   Don't forget to follow up on your eye exam

## 2012-08-28 ENCOUNTER — Encounter: Payer: Self-pay | Admitting: Nurse Practitioner

## 2012-08-28 ENCOUNTER — Ambulatory Visit (INDEPENDENT_AMBULATORY_CARE_PROVIDER_SITE_OTHER): Payer: Medicare HMO | Admitting: Nurse Practitioner

## 2012-08-28 VITALS — BP 140/58 | HR 78 | Temp 99.3°F | Ht 72.0 in | Wt 159.0 lb

## 2012-08-28 DIAGNOSIS — S81812A Laceration without foreign body, left lower leg, initial encounter: Secondary | ICD-10-CM

## 2012-08-28 DIAGNOSIS — S91009A Unspecified open wound, unspecified ankle, initial encounter: Secondary | ICD-10-CM

## 2012-08-28 DIAGNOSIS — Z23 Encounter for immunization: Secondary | ICD-10-CM

## 2012-08-28 MED ORDER — CEPHALEXIN 500 MG PO CAPS: 500.0000 mg | ORAL_CAPSULE | Freq: Three times a day (TID) | ORAL | Status: DC

## 2012-08-28 NOTE — Patient Instructions (Signed)

## 2012-08-28 NOTE — Progress Notes (Signed)
  Subjective:    Patient ID: Danny [name], male    DOB: September 10, 1931, 79 y.o.   MRN: [id number]  HPI  Patient was mowing his yard and lawn mower picked up a stick and it flew up and hit his shin- large gash in leg.    Review of Systems  Neurological: Negative for numbness.       Objective:   Physical Exam  Constitutional: He appears well-developed and well-nourished.  Skin:  3cm jagged laceration to left shin    Procedure:permission to repair given by patient Lidocaine 2% with epi- 7 cc local Cleaned with betadine NACL 100cc irrigation 3-0 chromic 3 subcuticular stitches 3-0 ethilon 4 vertical mattress sttitch and 1 simple interrupted stitch Cleaned with NACL Antibiotic oint and pressure dressing       Assessment & Plan:  1. Need for Tdap vaccination  - Tdap vaccine greater than or equal to 7yo IM  2. Laceration of left lower leg with complication, initial encounter Wound care directions given to patient Keep clean and dry Keflex 500mg  1 po TID X10 days #30 RTO in 10 days for stitche removal Watch for signs of infection and RTO ASAP if appear.  Mary-Margaret Daphine Deutscher, FNP

## 2012-08-29 ENCOUNTER — Ambulatory Visit: Payer: Medicare HMO | Admitting: Nurse Practitioner

## 2012-09-11 ENCOUNTER — Ambulatory Visit (INDEPENDENT_AMBULATORY_CARE_PROVIDER_SITE_OTHER): Payer: Medicare HMO | Admitting: Nurse Practitioner

## 2012-09-11 ENCOUNTER — Encounter: Payer: Self-pay | Admitting: Nurse Practitioner

## 2012-09-11 VITALS — BP 116/51 | HR 51 | Temp 96.5°F | Ht 72.0 in | Wt 161.0 lb

## 2012-09-11 DIAGNOSIS — Z4802 Encounter for removal of sutures: Secondary | ICD-10-CM

## 2012-09-11 NOTE — Progress Notes (Signed)
  Subjective:    Patient ID: Danny [name], male    DOB: [birth year], 79 y.o.   MRN: [id number]  HPI  Patient was mowing his yard 2 weeks ago and a stick cut his left shin. Had 5 stitches inserted and is here today for stitch removal.    Review of Systems  All other systems reviewed and are negative.       Objective:   Physical Exam  Constitutional: He appears well-developed and well-nourished.  Cardiovascular: Normal rate, regular rhythm and normal heart sounds.   Pulmonary/Chest: Effort normal and breath sounds normal.  Skin:  Left shin wound edges are well approximated- no erythema- no drainage    BP 116/51  Pulse 51  Temp(Src) 96.5 F (35.8 C) (Oral)  Ht 6' (1.829 m)  Wt 161 lb (73.029 kg)  BMI 21.83 kg/m2       Assessment & Plan:  1. Visit for suture removal  continue to keep wound clean and dry Follow up prn.  Mary-Margaret Daphine Deutscher, FNP

## 2012-09-11 NOTE — Patient Instructions (Signed)
Suture Removal  Your caregiver has removed your sutures today. If skin adhesive strips were applied at the time of suturing, or applied following removal of the sutures today, they will begin to peel off in a couple more days. If skin adhesive strips remain after 14 days, they may be removed.  HOME CARE INSTRUCTIONS    Change any bandages (dressings) at least once a day or as directed by your caregiver. If the bandage sticks, soak it off with warm, soapy water.   Wash the area with soap and water to remove all the cream or ointment (if you were instructed to use any) 2 times a day. Rinse off the soap and pat the area dry with a clean towel.   Reapply cream or ointment as directed by your caregiver. This will help prevent infection and keep the bandage from sticking.   Keep the wound area dry and clean. If the bandage becomes wet, dirty, or develops a bad smell, change it as soon as possible.   Only take over-the-counter or prescription medicines for pain, discomfort, or fever as directed by your caregiver.   Use sunscreen when out in the sun. New scars become sunburned easily.   Return to your caregivers office in in 7 days or as directed to have your sutures removed.  You may need a tetanus shot if:   You cannot remember when you had your last tetanus shot.   You have never had a tetanus shot.   The injury broke your skin.  If you got a tetanus shot, your arm may swell, get red, and feel warm to the touch. This is common and not a problem. If you need a tetanus shot and you choose not to have one, there is a rare chance of getting tetanus. Sickness from tetanus can be serious.  SEEK IMMEDIATE MEDICAL CARE IF:    There is redness, swelling, or increasing pain in the wound.   Pus is coming from the wound.   An unexplained oral temperature above 102 F (38.9 C) develops.   You notice a bad smell coming from the wound or dressing.   The wound breaks open (edges not staying together) after sutures have  been removed.  Document Released: 10/05/2000 Document Revised: 04/04/2011 Document Reviewed: 12/04/2006  [employer] Patient Information 2014 [employer].

## 2012-09-12 ENCOUNTER — Ambulatory Visit: Payer: Medicare HMO | Admitting: Family Medicine

## 2012-09-17 ENCOUNTER — Telehealth: Payer: Self-pay | Admitting: Nurse Practitioner

## 2012-09-17 ENCOUNTER — Ambulatory Visit (INDEPENDENT_AMBULATORY_CARE_PROVIDER_SITE_OTHER): Payer: Medicare HMO | Admitting: Nurse Practitioner

## 2012-09-17 ENCOUNTER — Encounter: Payer: Self-pay | Admitting: Nurse Practitioner

## 2012-09-17 VITALS — BP 136/56 | HR 77 | Temp 99.8°F | Ht 72.0 in | Wt 161.0 lb

## 2012-09-17 DIAGNOSIS — T50904A Poisoning by unspecified drugs, medicaments and biological substances, undetermined, initial encounter: Secondary | ICD-10-CM

## 2012-09-17 DIAGNOSIS — K521 Toxic gastroenteritis and colitis: Secondary | ICD-10-CM

## 2012-09-17 DIAGNOSIS — R197 Diarrhea, unspecified: Secondary | ICD-10-CM

## 2012-09-17 NOTE — Progress Notes (Signed)
  Subjective:    Patient ID: Danny [name], male    DOB: 1932-01-11, 79 y.o.   MRN: [id number]  HPI Patient lacerated left lower leg about 2 weeks ago- He was given doxycycline to prevent infection which has caused diarrhea- Finished antibiotic about 1 week ago- Taking antidiarrheal meds OTC which help but he is afriad to take cause says not to take with antibiotics.    Review of Systems  All other systems reviewed and are negative.       Objective:   Physical Exam  Constitutional: He appears well-developed and well-nourished.  Cardiovascular: Normal rate, regular rhythm and normal heart sounds.   Pulmonary/Chest: Effort normal and breath sounds normal.  Abdominal: Soft. Bowel sounds are normal. There is no tenderness.    BP 136/56  Pulse 77  Temp(Src) 99.8 F (37.7 C) (Oral)  Ht 6' (1.829 m)  Wt 161 lb (73.029 kg)  BMI 21.83 kg/m2       Assessment & Plan:  1. Diarrhea due to drug Stool specimen containers given to fill Continue OTC antidiarrheal Force fluids RTO prn  Mary-Margaret Daphine Deutscher, FNP

## 2012-09-17 NOTE — Telephone Encounter (Signed)
Mmm to advise

## 2012-09-17 NOTE — Telephone Encounter (Signed)
Pt here now to be seen 

## 2012-09-17 NOTE — Patient Instructions (Signed)
Diarrhea Diarrhea is frequent loose and watery bowel movements. It can cause you to feel weak and dehydrated. Dehydration can cause you to become tired and thirsty, have a dry mouth, and have decreased urination that often is dark yellow. Diarrhea is a sign of another problem, most often an infection that will not last long. In most cases, diarrhea typically lasts 2 3 days. However, it can last longer if it is a sign of something more serious. It is important to treat your diarrhea as directed by your caregive to lessen or prevent future episodes of diarrhea. CAUSES  Some common causes include:  Gastrointestinal infections caused by viruses, bacteria, or parasites.  Food poisoning or food allergies.  Certain medicines, such as antibiotics, chemotherapy, and laxatives.  Artificial sweeteners and fructose.  Digestive disorders. HOME CARE INSTRUCTIONS  Ensure adequate fluid intake (hydration): have 1 cup (8 oz) of fluid for each diarrhea episode. Avoid fluids that contain simple sugars or sports drinks, fruit juices, whole milk products, and sodas. Your urine should be clear or pale yellow if you are drinking enough fluids. Hydrate with an oral rehydration solution that you can purchase at pharmacies, retail stores, and online. You can prepare an oral rehydration solution at home by mixing the following ingredients together:    tsp table salt.   tsp baking soda.   tsp salt substitute containing potassium chloride.  1  tablespoons sugar.  1 L (34 oz) of water.  Certain foods and beverages may increase the speed at which food moves through the gastrointestinal (GI) tract. These foods and beverages should be avoided and include:  Caffeinated and alcoholic beverages.  High-fiber foods, such as raw fruits and vegetables, nuts, seeds, and whole grain breads and cereals.  Foods and beverages sweetened with sugar alcohols, such as xylitol, sorbitol, and mannitol.  Some foods may be well  tolerated and may help thicken stool including:  Starchy foods, such as rice, toast, pasta, low-sugar cereal, oatmeal, grits, baked potatoes, crackers, and bagels.  Bananas.  Applesauce.  Add probiotic-rich foods to help increase healthy bacteria in the GI tract, such as yogurt and fermented milk products.  Wash your hands well after each diarrhea episode.  Only take over-the-counter or prescription medicines as directed by your caregiver.  Take a warm bath to relieve any burning or pain from frequent diarrhea episodes. SEEK IMMEDIATE MEDICAL CARE IF:   You are unable to keep fluids down.  You have persistent vomiting.  You have blood in your stool, or your stools are black and tarry.  You do not urinate in 6 8 hours, or there is only a small amount of very dark urine.  You have abdominal pain that increases or localizes.  You have weakness, dizziness, confusion, or lightheadedness.  You have a severe headache.  Your diarrhea gets worse or does not get better.  You have a fever or persistent symptoms for more than 2 3 days.  You have a fever and your symptoms suddenly get worse. MAKE SURE YOU:   Understand these instructions.  Will watch your condition.  Will get help right away if you are not doing well or get worse. Document Released: 12/31/2001 Document Revised: 12/28/2011 Document Reviewed: 09/18/2011 [employer] Patient Information 2014 [employer].  

## 2012-09-17 NOTE — Telephone Encounter (Signed)
Just use imodium AD OTC

## 2012-09-18 LAB — CMP14+EGFR
Albumin: 3.5 g/dL (ref 3.5–4.8)
BUN: 8 mg/dL (ref 8–27)
CO2: 23 mmol/L (ref 18–29)
Calcium: 8.4 mg/dL — ABNORMAL LOW (ref 8.6–10.2)
Chloride: 95 mmol/L — ABNORMAL LOW (ref 97–108)
Creatinine, Ser: 0.96 mg/dL (ref 0.76–1.27)
Globulin, Total: 1.9 g/dL (ref 1.5–4.5)
Glucose: 176 mg/dL — ABNORMAL HIGH (ref 65–99)

## 2012-09-18 NOTE — Addendum Note (Signed)
Addended by: Roselyn Reef on: 09/18/2012 05:49 PM   Modules accepted: Orders

## 2012-09-19 ENCOUNTER — Telehealth: Payer: Self-pay | Admitting: Nurse Practitioner

## 2012-09-19 ENCOUNTER — Emergency Department (HOSPITAL_COMMUNITY): Payer: Medicare HMO

## 2012-09-19 ENCOUNTER — Emergency Department (HOSPITAL_COMMUNITY)
Admission: EM | Admit: 2012-09-19 | Discharge: 2012-09-19 | Disposition: A | Discharge status: Home / Self Care | Payer: Medicare HMO | Attending: Emergency Medicine | Admitting: Emergency Medicine

## 2012-09-19 ENCOUNTER — Encounter (HOSPITAL_COMMUNITY): Payer: Self-pay | Admitting: *Deleted

## 2012-09-19 DIAGNOSIS — Z7982 Long term (current) use of aspirin: Secondary | ICD-10-CM | POA: Insufficient documentation

## 2012-09-19 DIAGNOSIS — Z87891 Personal history of nicotine dependence: Secondary | ICD-10-CM | POA: Insufficient documentation

## 2012-09-19 DIAGNOSIS — Z8639 Personal history of other endocrine, nutritional and metabolic disease: Secondary | ICD-10-CM | POA: Insufficient documentation

## 2012-09-19 DIAGNOSIS — Z79899 Other long term (current) drug therapy: Secondary | ICD-10-CM | POA: Insufficient documentation

## 2012-09-19 DIAGNOSIS — F411 Generalized anxiety disorder: Secondary | ICD-10-CM | POA: Insufficient documentation

## 2012-09-19 DIAGNOSIS — Z9889 Other specified postprocedural states: Secondary | ICD-10-CM | POA: Insufficient documentation

## 2012-09-19 DIAGNOSIS — R197 Diarrhea, unspecified: Secondary | ICD-10-CM | POA: Insufficient documentation

## 2012-09-19 DIAGNOSIS — I10 Essential (primary) hypertension: Secondary | ICD-10-CM | POA: Insufficient documentation

## 2012-09-19 DIAGNOSIS — E785 Hyperlipidemia, unspecified: Secondary | ICD-10-CM | POA: Insufficient documentation

## 2012-09-19 DIAGNOSIS — Z951 Presence of aortocoronary bypass graft: Secondary | ICD-10-CM | POA: Insufficient documentation

## 2012-09-19 DIAGNOSIS — R1084 Generalized abdominal pain: Secondary | ICD-10-CM | POA: Insufficient documentation

## 2012-09-19 DIAGNOSIS — R32 Unspecified urinary incontinence: Secondary | ICD-10-CM | POA: Insufficient documentation

## 2012-09-19 DIAGNOSIS — K529 Noninfective gastroenteritis and colitis, unspecified: Secondary | ICD-10-CM

## 2012-09-19 DIAGNOSIS — K5289 Other specified noninfective gastroenteritis and colitis: Secondary | ICD-10-CM | POA: Insufficient documentation

## 2012-09-19 LAB — CBC WITH DIFFERENTIAL/PLATELET
Eosinophils Absolute: 0 10*3/uL (ref 0.0–0.7)
Eosinophils Relative: 0 % (ref 0–5)
HCT: 37.2 % — ABNORMAL LOW (ref 39.0–52.0)
Lymphocytes Relative: 5 % — ABNORMAL LOW (ref 12–46)
Lymphs Abs: 1.2 10*3/uL (ref 0.7–4.0)
MCH: 33.7 pg (ref 26.0–34.0)
MCV: 95.6 fL (ref 78.0–100.0)
Monocytes Absolute: 1.8 10*3/uL — ABNORMAL HIGH (ref 0.1–1.0)
Platelets: 281 10*3/uL (ref 150–400)
RBC: 3.89 MIL/uL — ABNORMAL LOW (ref 4.22–5.81)
WBC: 23.5 10*3/uL — ABNORMAL HIGH (ref 4.0–10.5)

## 2012-09-19 LAB — COMPREHENSIVE METABOLIC PANEL
ALT: 9 U/L (ref 0–53)
AST: 15 U/L (ref 0–37)
Albumin: 2.8 g/dL — ABNORMAL LOW (ref 3.5–5.2)
Calcium: 8.7 mg/dL (ref 8.4–10.5)
Sodium: 128 mEq/L — ABNORMAL LOW (ref 135–145)
Total Protein: 6.4 g/dL (ref 6.0–8.3)

## 2012-09-19 LAB — URINALYSIS, ROUTINE W REFLEX MICROSCOPIC
Bilirubin Urine: NEGATIVE
Hgb urine dipstick: NEGATIVE
Nitrite: NEGATIVE
Specific Gravity, Urine: 1.01 (ref 1.005–1.030)
pH: 5.5 (ref 5.0–8.0)

## 2012-09-19 MED ORDER — METRONIDAZOLE 500 MG PO TABS
500.0000 mg | ORAL_TABLET | Freq: Once | ORAL | Status: AC
  Administered 2012-09-19: 500 mg via ORAL
  Filled 2012-09-19: qty 1

## 2012-09-19 MED ORDER — ACETAMINOPHEN 500 MG PO TABS
1000.0000 mg | ORAL_TABLET | Freq: Once | ORAL | Status: AC
  Administered 2012-09-19: 500 mg via ORAL
  Filled 2012-09-19: qty 2

## 2012-09-19 MED ORDER — CIPROFLOXACIN HCL 250 MG PO TABS
500.0000 mg | ORAL_TABLET | Freq: Once | ORAL | Status: AC
  Administered 2012-09-19: 500 mg via ORAL
  Filled 2012-09-19: qty 2

## 2012-09-19 MED ORDER — METRONIDAZOLE 500 MG PO TABS: 500.0000 mg | ORAL_TABLET | Freq: Three times a day (TID) | ORAL | Status: DC

## 2012-09-19 MED ORDER — CIPROFLOXACIN HCL 500 MG PO TABS: 500.0000 mg | ORAL_TABLET | Freq: Two times a day (BID) | ORAL | Status: DC

## 2012-09-19 MED ORDER — SODIUM CHLORIDE 0.9 % IV BOLUS (SEPSIS)
1000.0000 mL | Freq: Once | INTRAVENOUS | Status: AC
  Administered 2012-09-19: 1000 mL via INTRAVENOUS

## 2012-09-19 NOTE — ED Provider Notes (Signed)
CSN: [id number]     Arrival date & time 09/19/12  1745 History   First MD Initiated Contact with Patient 09/19/12 1856     Chief Complaint  Patient presents with  . Fever   (Consider location/radiation/quality/duration/timing/severity/associated sxs/prior Treatment) Patient is a 79 y.o. male presenting with fever.  Fever  Pt reports he had a laceration on his right lower leg about 3 weeks ago, seen by PCP for repair and started on doxycycline. He finished the course about 2 weeks ago but notes that he has had diarrhea since starting the medication. He has not had any vomiting. No blood in stool but has had frequent loose, yellow stools including some incontinence at night. He was seen at PCP for same 2 days ago and had some blood work done which was unremarkable. Also collected a stool sample at home when he took in for analysis yesterday, however those results are not yet available. He has been taking imodium today and has not had any further stools, but his PCP advised him to come to the ED for further evaluation. He has been running low grade fever today off and on.   Past Medical History  Diagnosis Date  . Anxiety   . Hyperlipidemia   . Hypertension   . Vitamin D deficiency    Past Surgical History  Procedure Laterality Date  . Coronary artery bypass graft    . Carotid endarterectomy Left   . Carotid-subclavian bypass graft Right    Family History  Problem Relation Age of Onset  . Heart disease Mother   . Stroke Mother   . Stroke Father   . Diabetes Son    History  Substance Use Topics  . Smoking status: Former Smoker    Types: Cigarettes    Start date: 01/24/1950    Quit date: 01/24/1961  . Smokeless tobacco: Never Used  . Alcohol Use: Yes     Comment: not drank since 12/11    Review of Systems  Constitutional: Positive for fever.   All other systems reviewed and are negative except as noted in HPI.   Allergies  Doxycycline; Contrast media; Eszopiclone; and  Lunesta  Home Medications   Current Outpatient Rx  Name  Route  Sig  Dispense  Refill  . ALPRAZolam (XANAX) 0.25 MG tablet   Oral   Take 0.25 mg by mouth at bedtime as needed.           [name] aspirin 325 MG EC tablet   Oral   Take 325 mg by mouth daily.           . Cholecalciferol (VITAMIN D-3 PO)   Oral   Take 2,000 mg by mouth.          . losartan (COZAAR) 50 MG tablet   Oral   Take 50 mg by mouth daily.         [name] lovastatin (MEVACOR) 40 MG tablet   Oral   Take 40 mg by mouth at bedtime.           . metoprolol (LOPRESSOR) 50 MG tablet   Oral   Take 50 mg by mouth 2 (two) times daily. 1/2 TAB BID           . Multiple Vitamin (MULTIVITAMIN) tablet   Oral   Take 1 tablet by mouth daily.         . niacin (NIASPAN) 1000 MG CR tablet   Oral   Take 1,000 mg by mouth at bedtime.           [name]  EXPIRED: nitroGLYCERIN (NITROSTAT) 0.4 MG SL tablet   Sublingual   Place 1 tablet (0.4 mg total) under the tongue every 5 (five) minutes as needed for chest pain.   25 tablet   3   . Omega-3 Fatty Acids (FISH OIL) 1000 MG CAPS   Oral   Take 2 capsules by mouth daily.          [name] omeprazole (PRILOSEC OTC) 20 MG tablet   Oral   Take 20 mg by mouth daily.          BP 134/50  Pulse 88  Temp(Src) 99.3 F (37.4 C)  Resp 20  Ht 6\' 2"  (1.88 m)  Wt 161 lb (73.029 kg)  BMI 20.66 kg/m2  SpO2 98%  Physical Exam  Nursing note and vitals reviewed. Constitutional: He is oriented to person, place, and time. He appears well-developed and well-nourished.  HENT:  Head: Normocephalic and atraumatic.  Eyes: EOM are normal. Pupils are equal, round, and reactive to light.  Neck: Normal range of motion. Neck supple.  Cardiovascular: Normal rate, normal heart sounds and intact distal pulses.   Pulmonary/Chest: Effort normal and breath sounds normal.  Abdominal: Bowel sounds are normal. He exhibits no distension. There is tenderness (diffuse). There is guarding. There is no  rebound.  Musculoskeletal: Normal range of motion. He exhibits no edema and no tenderness.  Neurological: He is alert and oriented to person, place, and time. He has normal strength. No cranial nerve deficit or sensory deficit.  Skin: Skin is warm and dry. No rash noted.  Psychiatric: He has a normal mood and affect.    ED Course  Procedures (including critical care time) Labs Review Labs Reviewed  CBC WITH DIFFERENTIAL - Abnormal; Notable for the following:    WBC 23.5 (*)    RBC 3.89 (*)    HCT 37.2 (*)    Neutrophils Relative % 87 (*)    Neutro Abs 20.5 (*)    Lymphocytes Relative 5 (*)    Monocytes Absolute 1.8 (*)    All other components within normal limits  COMPREHENSIVE METABOLIC PANEL - Abnormal; Notable for the following:    Sodium 128 (*)    Potassium 3.4 (*)    Chloride 91 (*)    Glucose, Bld 101 (*)    Albumin 2.8 (*)    Total Bilirubin 0.2 (*)    GFR calc non Af Amer 69 (*)    GFR calc Af Amer 80 (*)    All other components within normal limits  URINALYSIS, ROUTINE W REFLEX MICROSCOPIC - Abnormal; Notable for the following:    Ketones, ur TRACE (*)    All other components within normal limits   Imaging Review Ct Abdomen Pelvis Wo Contrast  09/19/2012   *RADIOLOGY REPORT*  Clinical Data: Fever, abdominal pain and diarrhea.  CT ABDOMEN AND PELVIS WITHOUT CONTRAST  Technique:  Multidetector CT imaging of the abdomen and pelvis was performed following the standard protocol without intravenous contrast.  Comparison: None.  Findings: Circumferential wall thickening of the colon is noted beginning at the level of the cecum and continues to involve the ascending colon, hepatic flexure and transverse colon. Inflammation also likely extends across the splenic flexure into the descending colon, with relative sparing of the distal colon. Findings are consistent with colitis.  There is no evidence of perforation or focal abscess.  No free fluid is identified.  The small bowel is  of normal caliber.  Unenhanced appearance of the liver, gallbladder, spleen,  pancreas, adrenal glands and kidneys are unremarkable.  Some benign calcified granulomata are identified in the liver and spleen.  The bladder is unremarkable.  No masses or enlarged lymph nodes are seen.  Diffuse degenerative changes are present in the lumbar spine.  IMPRESSION: Colitis involving the proximal half of the colon up to roughly the level of the proximal descending colon.  There is no evidence of colonic perforation or focal abscess.   Original Report Authenticated By: Irish Lack, M.D.    MDM   1. Colitis     Labs and imaging reviewed, shows colitis. Possibly c-diff given recent antibiotics and significant leukocytosis however patient states he is feeling well and wants to go home. Will begin Cipro/Flagyl and recommend close PCP followup in 2-3 days for recheck.    Charles B. Bernette Mayers, MD 09/19/12 2112

## 2012-09-19 NOTE — ED Notes (Signed)
States he had been on antibiotics for the past 10 days, states she now has continual diarrhea

## 2012-09-19 NOTE — Telephone Encounter (Signed)
Need to go to ER

## 2012-09-19 NOTE — Telephone Encounter (Signed)
Pt aware.

## 2012-09-19 NOTE — Telephone Encounter (Signed)
MMM PLEASE ADDRESS 

## 2012-09-20 LAB — OVA AND PARASITE EXAMINATION

## 2012-09-24 LAB — STOOL CULTURE

## 2012-10-04 ENCOUNTER — Other Ambulatory Visit: Payer: Self-pay | Admitting: Cardiovascular Disease

## 2012-10-08 ENCOUNTER — Telehealth: Payer: Self-pay | Admitting: Family Medicine

## 2012-10-08 ENCOUNTER — Ambulatory Visit (INDEPENDENT_AMBULATORY_CARE_PROVIDER_SITE_OTHER): Payer: Medicare HMO | Admitting: Nurse Practitioner

## 2012-10-08 VITALS — BP 95/51 | HR 109 | Temp 97.7°F | Ht 74.0 in | Wt 158.0 lb

## 2012-10-08 DIAGNOSIS — E860 Dehydration: Secondary | ICD-10-CM

## 2012-10-08 LAB — POCT URINALYSIS DIPSTICK
Glucose, UA: NEGATIVE
Ketones, UA: NEGATIVE
Leukocytes, UA: NEGATIVE
Spec Grav, UA: 1.015
Urobilinogen, UA: NEGATIVE

## 2012-10-08 NOTE — Patient Instructions (Signed)
Hypotension  As your heart beats, it forces blood through your arteries. This force is your blood pressure. If your blood pressure is too low for you to go about your normal activities or support the organs of your body, you have hypotension, or low blood pressure. When your blood pressure becomes too low, you may not get enough blood to your brain, and you may feel weak, lightheaded, or develop a more rapid heart rate. In a more severe case, you may faint: this is a sudden, brief loss of consciousness where you pass out and recover completely.  CAUSES   Loss of blood or fluids from the body. This occurs during rapid blood loss. It can also come from dehydration when the body is not taking in enough fluids or is losing fluids faster than they can be replaced. Examples of this would be severe vomiting and diarrhea.   Not taking in enough fluids and salts. This is common in the elderly where thirst mechanisms are not working as well. This means you do not feel thirsty and you do not take in enough water.   Use of blood pressure pills and other medications that may lower the blood pressure below normal.   Over medication (always take your medications as directed).   Irregular heart beat or heart failure when the heart is no longer working well enough to support blood pressure.  Hospitalization is sometimes required for low blood pressure if fluid or blood replacement is needed, if time is needed for medications to wear off, or if further evaluation is needed. Less common causes of low blood pressure might include peripheral or autonomic neuropathy (nerve problems), Parkinson's disease, or other illnesses. Treatment might include a change in diet, change in medications (including medicines aimed at raising your blood pressure), and use of support stockings.  HOME CARE INSTRUCTIONS    Maintain good fluid intake and use a little more salt on your food (if you are not on a restricted diet or having problems with your  heart such as heart failure). This is especially important for the elderly when you may not feel thirsty in the winter.   Take your medications as directed.   Get up slowly from reclining or sitting positions. This gives your blood pressure a chance to adjust. As we grow older our ability to regulate our blood pressure may not be as good as when we were younger.   Wear support stockings if prescribed.   Use walkers, canes, etc., if advised.   Talk with your physician or nurse about a Home Safety Evaluation (usually done by visiting nurses).  SEEK IMMEDIATE MEDICAL CARE IF:    You have a fainting episode. Do not drive yourself. Call 911 if no other help is available.   You have chest pain, nausea (feeling sick to your stomach) or vomiting.   You have a loss of feeling in some part of your body, or lose movement in your arms or legs.   You have difficulty with speech.   You become sweaty and/or feel light headed.  Make sure you are re-checked as instructed.  MAKE SURE YOU:    Understand these instructions.   Will watch your condition.   Will get help right away if you are not doing well or get worse.  Document Released: 01/10/2005 Document Revised: 04/04/2011 Document Reviewed: 08/31/2007  [employer] Patient Information 2014 [employer]

## 2012-10-08 NOTE — Telephone Encounter (Signed)
Patient came in through triage and was evaluated by provider.

## 2012-10-08 NOTE — Progress Notes (Signed)
  Subjective:    Patient ID: Danny [name], male    DOB: 1932-01-20, 79 y.o.   MRN: [id number]  HPI Patient has been in hospital with C diff and was in hospital- Has been doing well- finished antibiotics 5 days ago- diarrhea started again yesterday. Took blood pressure meds at 3 am - checked blood pressure this AM and thoughtit was elevated so he took another rhalf of blood pressure med- now is very weak and feels like he is going to pass out.    Review of Systems  Neurological: Positive for dizziness and light-headedness.       Objective:   Physical Exam  Constitutional: He appears well-developed and well-nourished.  Cardiovascular: Normal rate and normal heart sounds.   tachycardia  Pulmonary/Chest: Effort normal and breath sounds normal.  Neurological: He has normal reflexes. No cranial nerve deficit.  Skin: Skin is warm.  Psychiatric: He has a normal mood and affect. His behavior is normal. Judgment and thought content normal.   Results for orders placed in visit on 10/08/12  POCT URINALYSIS DIPSTICK      Result Value Range   Color, UA yellow     Clarity, UA clear     Glucose, UA neg     Bilirubin, UA neg     Ketones, UA neg     Spec Grav, UA 1.015     Blood, UA neg     pH, UA 6.5     Protein, UA neg     Urobilinogen, UA negative     Nitrite, UA neg     Leukocytes, UA Negative            Assessment & Plan:  Mild dehydration  Hypotension  Go home Rest  Force fluids Do not take extra blood pressure meds unless told to do so No driving for 2 days RTO PRN  Mary-Margaret Daphine Deutscher, FNP

## 2012-10-18 ENCOUNTER — Other Ambulatory Visit (INDEPENDENT_AMBULATORY_CARE_PROVIDER_SITE_OTHER): Payer: Medicare HMO

## 2012-10-18 DIAGNOSIS — E559 Vitamin D deficiency, unspecified: Secondary | ICD-10-CM

## 2012-10-18 DIAGNOSIS — I10 Essential (primary) hypertension: Secondary | ICD-10-CM

## 2012-10-18 DIAGNOSIS — R5381 Other malaise: Secondary | ICD-10-CM

## 2012-10-18 DIAGNOSIS — Z125 Encounter for screening for malignant neoplasm of prostate: Secondary | ICD-10-CM

## 2012-10-18 DIAGNOSIS — R5383 Other fatigue: Secondary | ICD-10-CM

## 2012-10-18 DIAGNOSIS — E785 Hyperlipidemia, unspecified: Secondary | ICD-10-CM

## 2012-10-18 LAB — POCT CBC
Granulocyte percent: 82.8 %G — AB (ref 37–80)
Lymph, poc: 1.5 (ref 0.6–3.4)
MCHC: 32.4 g/dL (ref 31.8–35.4)
MCV: 95.8 fL (ref 80–97)
Platelet Count, POC: 316 10*3/uL (ref 142–424)
RDW, POC: 13.7 %
WBC: 12 10*3/uL — AB (ref 4.6–10.2)

## 2012-10-18 NOTE — Progress Notes (Signed)
Patient came in for labs only.

## 2012-10-20 LAB — HEPATIC FUNCTION PANEL
ALT: 9 IU/L (ref 0–44)
AST: 15 IU/L (ref 0–40)
Albumin: 3.9 g/dL (ref 3.5–4.8)
Alkaline Phosphatase: 57 IU/L (ref 39–117)

## 2012-10-20 LAB — PSA, TOTAL AND FREE
PSA, Free Pct: 25.5 %
PSA, Free: 0.28 ng/mL
PSA: 1.1 ng/mL (ref 0.0–4.0)

## 2012-10-20 LAB — BMP8+EGFR
BUN/Creatinine Ratio: 7 — ABNORMAL LOW (ref 10–22)
BUN: 6 mg/dL — ABNORMAL LOW (ref 8–27)
CO2: 27 mmol/L (ref 18–29)
Chloride: 98 mmol/L (ref 97–108)
Potassium: 5 mmol/L (ref 3.5–5.2)
Sodium: 138 mmol/L (ref 134–144)

## 2012-10-20 LAB — NMR, LIPOPROFILE
HDL Cholesterol by NMR: 50 mg/dL (ref >=40)
LDL Particle Number: 931 nmol/L (ref <1000)
LDLC SERPL CALC-MCNC: 88 mg/dL (ref <100)
LP-IR Score: <25 (ref <=45)

## 2012-10-20 LAB — VITAMIN D 25 HYDROXY (VIT D DEFICIENCY, FRACTURES): Vit D, 25-Hydroxy: 42.3 ng/mL (ref 30.0–100.0)

## 2012-10-24 ENCOUNTER — Ambulatory Visit (INDEPENDENT_AMBULATORY_CARE_PROVIDER_SITE_OTHER): Payer: Medicare HMO | Admitting: Family Medicine

## 2012-10-24 ENCOUNTER — Encounter: Payer: Self-pay | Admitting: Family Medicine

## 2012-10-24 VITALS — BP 115/63 | HR 80 | Temp 97.6°F | Ht 74.0 in | Wt 151.0 lb

## 2012-10-24 DIAGNOSIS — R197 Diarrhea, unspecified: Secondary | ICD-10-CM

## 2012-10-24 DIAGNOSIS — E7800 Pure hypercholesterolemia, unspecified: Secondary | ICD-10-CM

## 2012-10-24 DIAGNOSIS — K529 Noninfective gastroenteritis and colitis, unspecified: Secondary | ICD-10-CM

## 2012-10-24 DIAGNOSIS — K5289 Other specified noninfective gastroenteritis and colitis: Secondary | ICD-10-CM

## 2012-10-24 DIAGNOSIS — I10 Essential (primary) hypertension: Secondary | ICD-10-CM

## 2012-10-24 DIAGNOSIS — Z23 Encounter for immunization: Secondary | ICD-10-CM

## 2012-10-24 DIAGNOSIS — I25810 Atherosclerosis of coronary artery bypass graft(s) without angina pectoris: Secondary | ICD-10-CM

## 2012-10-24 DIAGNOSIS — I714 Abdominal aortic aneurysm, without rupture: Secondary | ICD-10-CM

## 2012-10-24 DIAGNOSIS — G909 Disorder of the autonomic nervous system, unspecified: Secondary | ICD-10-CM

## 2012-10-24 LAB — POCT CBC
HCT, POC: 39.3 % — AB (ref 43.5–53.7)
Hemoglobin: 12.7 g/dL — AB (ref 14.1–18.1)
MCHC: 32.4 g/dL (ref 31.8–35.4)
MCV: 94.9 fL (ref 80–97)
RDW, POC: 13.8 %
WBC: 9.6 10*3/uL (ref 4.6–10.2)

## 2012-10-24 NOTE — Addendum Note (Signed)
Addended by: Fawn Kirk on: 10/24/2012 09:58 AM   Modules accepted: Orders

## 2012-10-24 NOTE — Progress Notes (Signed)
Subjective:    Patient ID: Danny [name]., male    DOB: 1931-04-25, 79 y.o.   MRN: [id number]  HPI Patient here for follow up and management of chronic medical problems. Labs were reviewed with patient today. His sodium chloride and potassium were slightly decreased. He gives a history of colitis which developed following taking an antibiotic for a laceration. He ended up in the hospital and was subsequently treated with ciprofloxacin and metronidazole. He continues to have diarrhea and stays on Imodium regularly. He says he is very weak and has felt syncopal at times especially after standing. He is still having loose stools as mentioned. On review of health maintenance issues he is view a colonoscopy in 2000 9:15. Because of the weight loss of 10 pounds and his weakness we will refer him to a gastroenterologist for further workup of his colitis. He is also due for a visit with his cardiologist and they will reevaluate his carotid stenosis and subclavian steal syndrome.    Patient Active Problem List   Diagnosis Date Noted  . AAA (abdominal aortic aneurysm) without rupture 05/18/2010  . Hyperplasia of prostate 05/18/2010  . Carotid stenosis 05/18/2010  . CAROTID BRUIT 01/06/2010  . HYPERCHOLESTEROLEMIA  IIA 05/19/2008  . SYNCOPE-CAROTID SINUS 05/19/2008  . BLURRED VISION 05/19/2008  . HYPERTENSION, UNSPECIFIED 05/19/2008  . CAD, ARTERY BYPASS GRAFT 05/19/2008  . SUBCLAVIAN STEAL SYNDROME 05/19/2008   Outpatient Encounter Prescriptions as of 10/24/2012  Medication Sig Dispense Refill  . ALPRAZolam (XANAX) 0.25 MG tablet Take 0.25 mg by mouth at bedtime as needed.        [name] aspirin 325 MG EC tablet Take 325 mg by mouth every morning.       . Cholecalciferol (VITAMIN D-3 PO) Take 2,000 mg by mouth every morning.       [name] losartan (COZAAR) 50 MG tablet Take 50 mg by mouth every morning.       . lovastatin (MEVACOR) 40 MG tablet Take 40 mg by mouth every morning.       . metoprolol  (LOPRESSOR) 50 MG tablet Take 25 mg by mouth 2 (two) times daily. 1/2 TAB BID      . Multiple Vitamin (MULTIVITAMIN) tablet Take 1 tablet by mouth every morning.       [name] NITROSTAT 0.4 MG SL tablet PLACE 1 TABLET (0.4 MG TOTAL) UNDER THE TONGUE EVERY 5 (FIVE) MINUTESAS NEEDED FOR CHEST PAIN.  25 tablet  2  . Omega-3 Fatty Acids (FISH OIL) 1000 MG CAPS Take 2 capsules by mouth every morning.       [name] omeprazole (PRILOSEC OTC) 20 MG tablet Take 20 mg by mouth every morning.       . [DISCONTINUED] niacin (NIASPAN) 1000 MG CR tablet Take 1,000 mg by mouth at bedtime.         No facility-administered encounter medications on file as of 10/24/2012.      Review of Systems  Constitutional: Negative.   HENT: Negative.   Eyes: Negative.   Respiratory: Negative.   Cardiovascular: Negative.   Gastrointestinal: Positive for diarrhea.       Recent colitis hospital visit at [facility]- 09/19/12  Endocrine: Negative.   Genitourinary: Negative.   Musculoskeletal: Negative.   Skin: Negative.   Allergic/Immunologic: Negative.   Neurological: Positive for dizziness.  Hematological: Negative.   Psychiatric/Behavioral: Negative.        Objective:   Physical Exam  Nursing note and vitals reviewed. Constitutional: He is oriented to person,  place, and time. He appears distressed.  Patient is somewhat thin and more frail than when he was last seen. He is somewhat distressed regarding his weight loss and persistent diarrhea.  HENT:  Head: Normocephalic and atraumatic.  Right Ear: External ear normal.  Left Ear: External ear normal.  Nose: Nose normal.  Mouth/Throat: Oropharynx is clear and moist. No oropharyngeal exudate.  Eyes: Conjunctivae and EOM are normal. Pupils are equal, round, and reactive to light. Right eye exhibits no discharge. Left eye exhibits no discharge. No scleral icterus.  Neck: Normal range of motion. Neck supple. No tracheal deviation present. No thyromegaly present.  Patient has  bilateral carotid bruits right greater than left also bilateral supraclavicular bruits right greater than left  Cardiovascular: Normal rate, regular rhythm, normal heart sounds and intact distal pulses.  Exam reveals no gallop and no friction rub.   No murmur heard. Right carotid bruit  Pulmonary/Chest: Effort normal and breath sounds normal. No respiratory distress. He has no wheezes. He has no rales. He exhibits no tenderness.  Abdominal: Soft. Bowel sounds are normal. He exhibits no mass. There is no tenderness. There is no rebound and no guarding.  Genitourinary:  Rectal exam was not done today at patient's request because of colitis problem  Musculoskeletal: Normal range of motion. He exhibits no edema and no tenderness.  Lymphadenopathy:    He has no cervical adenopathy.  Neurological: He is alert and oriented to person, place, and time. He has normal reflexes. No cranial nerve deficit.  Skin: Skin is warm and dry. No rash noted. No erythema. No pallor.  Psychiatric: He has a normal mood and affect. His behavior is normal. Judgment and thought content normal.   BP 115/63  Pulse 80  Temp(Src) 97.6 F (36.4 C) (Oral)  Ht 6\' 2"  (1.88 m)  Wt 151 lb (68.493 kg)  BMI 19.38 kg/m2        Assessment & Plan:   1. Frequent loose stools   2. SYNCOPE-CAROTID SINUS   3. HYPERTENSION, UNSPECIFIED   4. HYPERCHOLESTEROLEMIA  IIA   5. AAA (abdominal aortic aneurysm) without rupture   6. CAD, ARTERY BYPASS GRAFT   7. Noninfectious gastroenteritis and colitis    Orders Placed This Encounter  Procedures  . Ambulatory referral to Gastroenterology    Referral Priority:  Routine    Referral Type:  Consultation    Referral Reason:  Specialty Services Required    Requested Specialty:  Gastroenterology    Number of Visits Requested:  1   No orders of the defined types were placed in this encounter.   Patient Instructions  Continue current medications. Continue good therapeutic lifestyle  changes which include good diet and exercise. Fall precautions discussed with patient. follow up as planned and earlier as needed.  Bring back stool card (FOBT). Take align one day We will arrange an appointment with the gastroenterologist for further evaluation of colitis Avoid caffeine and milk cheese ice cream and dairy products as much as positive     Nyra Capes MD

## 2012-10-24 NOTE — Patient Instructions (Addendum)
Continue current medications. Continue good therapeutic lifestyle changes which include good diet and exercise. Fall precautions discussed with patient. follow up as planned and earlier as needed.  Bring back stool card (FOBT). Take align one day We will arrange an appointment with the gastroenterologist for further evaluation of colitis Avoid caffeine and milk cheese ice cream and dairy products as much as positive

## 2012-10-24 NOTE — Addendum Note (Signed)
Addended by: Magdalene River on: 10/24/2012 09:30 AM   Modules accepted: Orders

## 2012-10-25 LAB — BMP8+EGFR
BUN/Creatinine Ratio: 7 — ABNORMAL LOW (ref 10–22)
BUN: 6 mg/dL — ABNORMAL LOW (ref 8–27)
Chloride: 96 mmol/L — ABNORMAL LOW (ref 97–108)
GFR calc Af Amer: 97 mL/min/{1.73_m2} (ref >59)
GFR calc non Af Amer: 84 mL/min/{1.73_m2} (ref >59)
Glucose: 114 mg/dL — ABNORMAL HIGH (ref 65–99)
Potassium: 5 mmol/L (ref 3.5–5.2)

## 2012-10-26 ENCOUNTER — Other Ambulatory Visit (INDEPENDENT_AMBULATORY_CARE_PROVIDER_SITE_OTHER): Payer: Medicare HMO

## 2012-10-26 DIAGNOSIS — Z1212 Encounter for screening for malignant neoplasm of rectum: Secondary | ICD-10-CM

## 2012-10-27 LAB — FECAL OCCULT BLOOD, IMMUNOCHEMICAL: Fecal Occult Bld: POSITIVE — AB

## 2012-10-29 ENCOUNTER — Other Ambulatory Visit: Payer: Self-pay | Admitting: *Deleted

## 2012-10-29 DIAGNOSIS — R195 Other fecal abnormalities: Secondary | ICD-10-CM

## 2012-11-02 ENCOUNTER — Other Ambulatory Visit (INDEPENDENT_AMBULATORY_CARE_PROVIDER_SITE_OTHER): Payer: Medicare HMO

## 2012-11-02 DIAGNOSIS — Z1212 Encounter for screening for malignant neoplasm of rectum: Secondary | ICD-10-CM

## 2012-11-04 LAB — FECAL OCCULT BLOOD, IMMUNOCHEMICAL: Fecal Occult Bld: POSITIVE — AB

## 2012-11-07 ENCOUNTER — Encounter (INDEPENDENT_AMBULATORY_CARE_PROVIDER_SITE_OTHER): Payer: Self-pay | Admitting: *Deleted

## 2012-11-09 NOTE — Progress Notes (Signed)
LM for Rogen to return the call to get his apt rescheduled.

## 2012-11-09 NOTE — Progress Notes (Signed)
Apt has been rescheduled to 11/21/12 at 11:00 am.

## 2012-11-21 ENCOUNTER — Ambulatory Visit (INDEPENDENT_AMBULATORY_CARE_PROVIDER_SITE_OTHER): Payer: Medicare HMO | Admitting: Internal Medicine

## 2012-11-21 ENCOUNTER — Encounter (INDEPENDENT_AMBULATORY_CARE_PROVIDER_SITE_OTHER): Payer: Self-pay | Admitting: Internal Medicine

## 2012-11-21 VITALS — BP 90/48 | HR 60 | Temp 97.6°F | Ht 74.0 in | Wt 153.6 lb

## 2012-11-21 DIAGNOSIS — A0472 Enterocolitis due to Clostridium difficile, not specified as recurrent: Secondary | ICD-10-CM

## 2012-11-21 DIAGNOSIS — R197 Diarrhea, unspecified: Secondary | ICD-10-CM

## 2012-11-21 HISTORY — DX: Enterocolitis due to Clostridium difficile, not specified as recurrent: A04.72

## 2012-11-21 NOTE — Progress Notes (Signed)
Subjective:     Patient ID: Danny [name]., male   DOB: Sep 17, 1931, 79 y.o.   MRN: [id number]  HPI Referred to our office by Dr. Christell Constant for diarrhea. The diarrhea started soon after starting the Doxycycline.  He tells me he had sutures in July and was given Doxycyline x 10 days. He tells me developed c-diff. He was seen in the ED in August and was given Flagyl and Cipro x 14 days. He was having 12-15 stools a day.  The diarrhea resolved.  He presently taking a probiotic for the diarrhea. He has not had any diarrhea in October since starting the Probiotic. He is having one stool a day. No blood in stool. His stools are brown in color. His last colonoscopy was 9 yrs ago by [facility] and he reports was normal.  Appetite is good. No weight loss.  No abdominal pain.     Review of Systems see hpi Current Outpatient Prescriptions  Medication Sig Dispense Refill  . ALPRAZolam (XANAX) 0.25 MG tablet Take 0.25 mg by mouth at bedtime as needed.        [name] aspirin 325 MG EC tablet Take 325 mg by mouth every morning.       . Cholecalciferol (VITAMIN D-3 PO) Take 2,000 mg by mouth every morning.       [name] losartan (COZAAR) 50 MG tablet Take 50 mg by mouth every morning.       . metoprolol (LOPRESSOR) 50 MG tablet Take 25 mg by mouth 2 (two) times daily. 1/2 TAB BID      . Multiple Vitamin (MULTIVITAMIN) tablet Take 1 tablet by mouth every morning.       [name] NITROSTAT 0.4 MG SL tablet PLACE 1 TABLET (0.4 MG TOTAL) UNDER THE TONGUE EVERY 5 (FIVE) MINUTESAS NEEDED FOR CHEST PAIN.  25 tablet  2  . Omega-3 Fatty Acids (FISH OIL) 1000 MG CAPS Take 2 capsules by mouth every morning.       . Probiotic Product (ALIGN) 4 MG CAPS Take by mouth.      . lovastatin (MEVACOR) 40 MG tablet Take 40 mg by mouth every morning.        No current facility-administered medications for this visit.   Past Medical History  Diagnosis Date  . Anxiety   . Hyperlipidemia   . Hypertension   . Vitamin D deficiency   . C. difficile  colitis    Past Surgical History  Procedure Laterality Date  . Coronary artery bypass graft    . Carotid endarterectomy Left   . Carotid-subclavian bypass graft Right         Objective:   Physical Exam There were no vitals filed for this visit. Filed Vitals:   11/21/12 1228  BP: 90/48  Pulse: 60  Temp: 97.6 F (36.4 C)  Height: 6\' 2"  (1.88 m)  Weight: 153 lb 9.6 oz (69.673 kg)   Alert and oriented. Skin warm and dry. Oral mucosa is moist.   . Sclera anicteric, conjunctivae is pink. Thyroid not enlarged. No cervical lymphadenopathy. Lungs clear. Heart regular rate and rhythm.  Abdomen is soft. Bowel sounds are positive. No hepatomegaly. No abdominal masses felt. No tenderness.  No edema to lower extremities. Stool brown and guaiacnegative.      Assessment:    Diarrhea: antibiotic induced c-difficile. Diarrhea has stopped. No diarrhea in over a month. I discussed this case with Dr. Karilyn Cota.     Plan:   Continue the Probiotic x 1 month.  If you develop diarrhea again, you need to call our office for an appt.

## 2012-11-21 NOTE — Patient Instructions (Signed)
Continue Probiotic x 1 month. If you develop diarrhea again, call our office. Do not wait.

## 2012-11-29 ENCOUNTER — Ambulatory Visit (INDEPENDENT_AMBULATORY_CARE_PROVIDER_SITE_OTHER): Payer: Medicare HMO | Admitting: Internal Medicine

## 2012-11-30 NOTE — Progress Notes (Signed)
Patient was seen 11/21/12 by Dorene Ar, these results were from first of October

## 2012-12-10 ENCOUNTER — Ambulatory Visit (INDEPENDENT_AMBULATORY_CARE_PROVIDER_SITE_OTHER): Payer: Medicare HMO | Admitting: Cardiovascular Disease

## 2012-12-10 ENCOUNTER — Encounter: Payer: Self-pay | Admitting: Cardiovascular Disease

## 2012-12-10 VITALS — BP 156/61 | HR 53 | Ht 74.0 in | Wt 156.0 lb

## 2012-12-10 DIAGNOSIS — I10 Essential (primary) hypertension: Secondary | ICD-10-CM

## 2012-12-10 DIAGNOSIS — I25810 Atherosclerosis of coronary artery bypass graft(s) without angina pectoris: Secondary | ICD-10-CM

## 2012-12-10 DIAGNOSIS — R0989 Other specified symptoms and signs involving the circulatory and respiratory systems: Secondary | ICD-10-CM

## 2012-12-10 DIAGNOSIS — G458 Other transient cerebral ischemic attacks and related syndromes: Secondary | ICD-10-CM

## 2012-12-10 NOTE — Progress Notes (Signed)
Patient ID: Danny [name]., male   DOB: 10/09/1931, 79 y.o.   MRN: [id number] Danny [name] is seen today for distant CABG, 2003 carotid disease with left CEA and right subclavian bypass. in 2009 He has HTN and elevated lipids. last visit he had had a syncopal episode. There was no cardiac etiology found. He has great exercise tolerance with no angina. He has improved strength in his right arm and no TIA symptoms. He has residual right carotid disease. Reviewed last duplex from 01/09/12 and right subclavian bypass ok as is left CEA. 60-79% RICA stenosis with with systolic velocity over 73m/sec needs f/u  Compliant with meds  Labs reviewed from [facility] 02/03/10 ok except mild elevation in ALT/SGPT 79 ( 0-65) Due to have labs next month. Needs nitro    Carotid 05/17/12 60% -79% RICA patent bypass to rigtht arm 40-59% LICA   Recent C diff colitis   ROS: Denies fever, malais, weight loss, blurry vision, decreased visual acuity, cough, sputum, SOB, hemoptysis, pleuritic pain, palpitaitons, heartburn, abdominal pain, melena, lower extremity edema, claudication, or rash.  All other systems reviewed and negative  General: Affect appropriate Healthy:  appears stated age HEENT: normal Neck supple with no adenopathy JVP normal  Bilateral carotid and subclavian bruits no thyromegaly Lungs clear with no wheezing and good diaphragmatic motion Heart:  S1/S2 SEM murmur, no rub, gallop or click PMI normal Abdomen: benighn, BS positve, no tenderness, no AAA no bruit.  No HSM or HJR Distal pulses intact with no bruits No edema Neuro non-focal Skin warm and dry No muscular weakness   Current Outpatient Prescriptions  Medication Sig Dispense Refill  . ALPRAZolam (XANAX) 0.25 MG tablet Take 0.25 mg by mouth at bedtime as needed.        [name] aspirin 325 MG EC tablet Take 325 mg by mouth every morning.       . Cholecalciferol (VITAMIN D-3 PO) Take 2,000 mg by mouth every morning.       [name] losartan (COZAAR) 50 MG tablet  Take 50 mg by mouth every morning.       . lovastatin (MEVACOR) 40 MG tablet Take 40 mg by mouth every morning.       . metoprolol (LOPRESSOR) 50 MG tablet Take 25 mg by mouth 2 (two) times daily. 1/2 TAB BID      . Multiple Vitamin (MULTIVITAMIN) tablet Take 1 tablet by mouth every morning.       [name] NITROSTAT 0.4 MG SL tablet PLACE 1 TABLET (0.4 MG TOTAL) UNDER THE TONGUE EVERY 5 (FIVE) MINUTESAS NEEDED FOR CHEST PAIN.  25 tablet  2  . Omega-3 Fatty Acids (FISH OIL) 1000 MG CAPS Take 2 capsules by mouth every morning.       . Probiotic Product (ALIGN) 4 MG CAPS Take by mouth.       No current facility-administered medications for this visit.    Allergies  Doxycycline; Contrast media; Eszopiclone; and Lunesta  Electrocardiogram:  NSR rate 60 LAE otherwise normal ECG  Assessment and Plan

## 2012-12-10 NOTE — Assessment & Plan Note (Signed)
Stable with no angina and good activity level.  Continue medical Rx Continue ASA and beta blocker

## 2012-12-10 NOTE — Assessment & Plan Note (Signed)
F/U carotid duplex in [place]  ASA

## 2012-12-10 NOTE — Addendum Note (Signed)
Addended by: Derry Lory A on: 12/10/2012 11:35 AM   Modules accepted: Orders

## 2012-12-10 NOTE — Patient Instructions (Addendum)
Your physician recommends that you schedule a follow-up appointment in: 6 months   Your physician has requested that you have a carotid duplex. This test is an ultrasound of the carotid arteries in your neck. It looks at blood flow through these arteries that supply the brain with blood. Allow one hour for this exam. There are no restrictions or special instructions. IN THE  OFFICE

## 2012-12-10 NOTE — Assessment & Plan Note (Signed)
S/P right subclavian bypass.  No pain in arm with exertion stable

## 2012-12-10 NOTE — Assessment & Plan Note (Signed)
Well controlled.  Continue current medications and low sodium Dash type diet.    

## 2012-12-13 ENCOUNTER — Ambulatory Visit (HOSPITAL_COMMUNITY): Payer: Medicare HMO | Attending: Cardiovascular Disease

## 2012-12-13 DIAGNOSIS — I658 Occlusion and stenosis of other precerebral arteries: Secondary | ICD-10-CM | POA: Insufficient documentation

## 2012-12-13 DIAGNOSIS — I6529 Occlusion and stenosis of unspecified carotid artery: Secondary | ICD-10-CM | POA: Insufficient documentation

## 2012-12-13 DIAGNOSIS — R0989 Other specified symptoms and signs involving the circulatory and respiratory systems: Secondary | ICD-10-CM | POA: Insufficient documentation

## 2012-12-13 DIAGNOSIS — I2510 Atherosclerotic heart disease of native coronary artery without angina pectoris: Secondary | ICD-10-CM | POA: Insufficient documentation

## 2012-12-13 DIAGNOSIS — G458 Other transient cerebral ischemic attacks and related syndromes: Secondary | ICD-10-CM

## 2012-12-13 DIAGNOSIS — I10 Essential (primary) hypertension: Secondary | ICD-10-CM | POA: Insufficient documentation

## 2012-12-13 DIAGNOSIS — E785 Hyperlipidemia, unspecified: Secondary | ICD-10-CM | POA: Insufficient documentation

## 2012-12-13 DIAGNOSIS — Z951 Presence of aortocoronary bypass graft: Secondary | ICD-10-CM | POA: Insufficient documentation

## 2012-12-24 ENCOUNTER — Other Ambulatory Visit: Payer: Self-pay

## 2012-12-24 MED ORDER — ALPRAZOLAM 0.25 MG PO TABS: 0.2500 mg | ORAL_TABLET | Freq: Every evening | ORAL | Status: DC | PRN

## 2012-12-24 NOTE — Telephone Encounter (Signed)
Last seen 10/24/12  DWM  If approved route to nurse to phone into [facility]

## 2012-12-24 NOTE — Telephone Encounter (Signed)
This is okay for 3 months 

## 2012-12-25 NOTE — Telephone Encounter (Signed)
Called in [facility] 

## 2013-01-29 ENCOUNTER — Other Ambulatory Visit (INDEPENDENT_AMBULATORY_CARE_PROVIDER_SITE_OTHER): Payer: Medicare HMO

## 2013-01-29 DIAGNOSIS — E785 Hyperlipidemia, unspecified: Secondary | ICD-10-CM

## 2013-01-29 DIAGNOSIS — E559 Vitamin D deficiency, unspecified: Secondary | ICD-10-CM

## 2013-01-29 DIAGNOSIS — I10 Essential (primary) hypertension: Secondary | ICD-10-CM

## 2013-01-29 LAB — POCT CBC
GRANULOCYTE PERCENT: 61.2 % (ref 37–80)
HEMATOCRIT: 40.4 % — AB (ref 43.5–53.7)
Hemoglobin: 13.8 g/dL — AB (ref 14.1–18.1)
Lymph, poc: 1.8 (ref 0.6–3.4)
MCH, POC: 32.1 pg — AB (ref 27–31.2)
MCHC: 34.2 g/dL (ref 31.8–35.4)
MCV: 93.7 fL (ref 80–97)
MPV: 7.2 fL (ref 0–99.8)
POC Granulocyte: 3.1 (ref 2–6.9)
POC LYMPH PERCENT: 35.7 %L (ref 10–50)
Platelet Count, POC: 231 10*3/uL (ref 142–424)
RBC: 4.3 M/uL — AB (ref 4.69–6.13)
RDW, POC: 13.5 %
WBC: 5.1 10*3/uL (ref 4.6–10.2)

## 2013-01-29 NOTE — Progress Notes (Signed)
Pt came in for labs only 

## 2013-01-30 LAB — BMP8+EGFR
BUN / CREAT RATIO: 11 (ref 10–22)
BUN: 10 mg/dL (ref 8–27)
CO2: 24 mmol/L (ref 18–29)
CREATININE: 0.92 mg/dL (ref 0.76–1.27)
Calcium: 9 mg/dL (ref 8.6–10.2)
Chloride: 97 mmol/L (ref 97–108)
GFR, EST AFRICAN AMERICAN: 91 mL/min/{1.73_m2} (ref >59)
GFR, EST NON AFRICAN AMERICAN: 79 mL/min/{1.73_m2} (ref >59)
Glucose: 86 mg/dL (ref 65–99)
Potassium: 4.5 mmol/L (ref 3.5–5.2)
Sodium: 138 mmol/L (ref 134–144)

## 2013-01-30 LAB — NMR, LIPOPROFILE
Cholesterol: 167 mg/dL (ref <200)
HDL Cholesterol by NMR: 60 mg/dL (ref >=40)
HDL PARTICLE NUMBER: 32.2 umol/L (ref >=30.5)
LDL Particle Number: 845 nmol/L (ref <1000)
LDL Size: 21.1 nm (ref >20.5)
LDLC SERPL CALC-MCNC: 94 mg/dL (ref <100)
SMALL LDL PARTICLE NUMBER: 92 nmol/L (ref <=527)
Triglycerides by NMR: 65 mg/dL (ref <150)

## 2013-01-30 LAB — VITAMIN D 25 HYDROXY (VIT D DEFICIENCY, FRACTURES): VIT D 25 HYDROXY: 52.2 ng/mL (ref 30.0–100.0)

## 2013-01-30 LAB — HEPATIC FUNCTION PANEL
ALBUMIN: 4.2 g/dL (ref 3.5–4.8)
ALK PHOS: 61 IU/L (ref 39–117)
ALT: 11 IU/L (ref 0–44)
AST: 15 IU/L (ref 0–40)
BILIRUBIN DIRECT: 0.12 mg/dL (ref 0.00–0.40)
BILIRUBIN TOTAL: 0.4 mg/dL (ref 0.0–1.2)
Total Protein: 6 g/dL (ref 6.0–8.5)

## 2013-02-05 ENCOUNTER — Encounter: Payer: Self-pay | Admitting: Family Medicine

## 2013-02-05 ENCOUNTER — Ambulatory Visit (INDEPENDENT_AMBULATORY_CARE_PROVIDER_SITE_OTHER): Payer: Medicare HMO | Admitting: Family Medicine

## 2013-02-05 VITALS — BP 139/69 | HR 49 | Temp 97.9°F | Ht 74.0 in | Wt 156.0 lb

## 2013-02-05 DIAGNOSIS — E7800 Pure hypercholesterolemia, unspecified: Secondary | ICD-10-CM

## 2013-02-05 DIAGNOSIS — I25810 Atherosclerosis of coronary artery bypass graft(s) without angina pectoris: Secondary | ICD-10-CM

## 2013-02-05 DIAGNOSIS — K521 Toxic gastroenteritis and colitis: Secondary | ICD-10-CM

## 2013-02-05 DIAGNOSIS — E559 Vitamin D deficiency, unspecified: Secondary | ICD-10-CM

## 2013-02-05 DIAGNOSIS — I10 Essential (primary) hypertension: Secondary | ICD-10-CM

## 2013-02-05 DIAGNOSIS — R197 Diarrhea, unspecified: Secondary | ICD-10-CM

## 2013-02-05 MED ORDER — LOSARTAN POTASSIUM 50 MG PO TABS: 50.0000 mg | ORAL_TABLET | Freq: Every morning | ORAL | Status: DC

## 2013-02-05 MED ORDER — METOPROLOL TARTRATE 50 MG PO TABS: 25.0000 mg | ORAL_TABLET | Freq: Two times a day (BID) | ORAL | Status: DC

## 2013-02-05 MED ORDER — LOVASTATIN 40 MG PO TABS: 40.0000 mg | ORAL_TABLET | Freq: Every morning | ORAL | Status: DC

## 2013-02-05 NOTE — Patient Instructions (Addendum)
Continue current medications. Continue good therapeutic lifestyle changes which include good diet and exercise. Keep doing exactly what you've been doing for the past 3 months Fall precautions discussed with patient. Schedule your flu vaccine if you haven't had it yet If you are over 78 years old - you may need Prevnar 54 or the adult Pneumonia vaccine. Use saline nose spray frequently during the day Use a cool mist humidifier in her bedroom at nighttime

## 2013-02-05 NOTE — Progress Notes (Signed)
Subjective:    Patient ID: Danny [name]., male    DOB: [birth year], 80 y.o.   MRN: [id number]  HPI Pt here for follow up and management of chronic medical problems. Recent lab work was reviewed with patient and all numbers cholesterol renal and liver and vitamin D were excellent. Patient has recently seen the cardiologist and had carotid Dopplers done. He also saw the PA for the gastroenterologist and since his colitis had resolved no further studies were plan.        Patient Active Problem List   Diagnosis Date Noted  . Enteritis due to Clostridium difficile 11/21/2012  . AAA (abdominal aortic aneurysm) without rupture 05/18/2010  . Hyperplasia of prostate 05/18/2010  . Carotid stenosis 05/18/2010  . CAROTID BRUIT 01/06/2010  . HYPERCHOLESTEROLEMIA  IIA 05/19/2008  . SYNCOPE-CAROTID SINUS 05/19/2008  . BLURRED VISION 05/19/2008  . HYPERTENSION, UNSPECIFIED 05/19/2008  . CAD, ARTERY BYPASS GRAFT 05/19/2008  . SUBCLAVIAN STEAL SYNDROME 05/19/2008   Outpatient Encounter Prescriptions as of 02/05/2013  Medication Sig  . ALPRAZolam (XANAX) 0.25 MG tablet Take 1 tablet (0.25 mg total) by mouth at bedtime as needed.  [name] aspirin 325 MG EC tablet Take 325 mg by mouth every morning.   . Cholecalciferol (VITAMIN D-3 PO) Take 2,000 mg by mouth every morning.   [name] losartan (COZAAR) 50 MG tablet Take 50 mg by mouth every morning.   . lovastatin (MEVACOR) 40 MG tablet Take 40 mg by mouth every morning.   . metoprolol (LOPRESSOR) 50 MG tablet Take 25 mg by mouth 2 (two) times daily. 1/2 TAB BID  . Multiple Vitamin (MULTIVITAMIN) tablet Take 1 tablet by mouth every morning.   [name] NITROSTAT 0.4 MG SL tablet PLACE 1 TABLET (0.4 MG TOTAL) UNDER THE TONGUE EVERY 5 (FIVE) MINUTESAS NEEDED FOR CHEST PAIN.  [name] Omega-3 Fatty Acids (FISH OIL) 1000 MG CAPS Take 2 capsules by mouth every morning.   . Probiotic Product (ALIGN) 4 MG CAPS Take by mouth.    Review of Systems  Constitutional: Negative.     HENT: Negative.   Eyes: Negative.   Respiratory: Negative.   Cardiovascular: Negative.   Gastrointestinal: Negative.   Endocrine: Negative.   Genitourinary: Negative.   Musculoskeletal: Negative.   Skin: Negative.   Allergic/Immunologic: Negative.   Neurological: Negative.   Hematological: Negative.   Psychiatric/Behavioral: Negative.        Objective:   Physical Exam  Nursing note and vitals reviewed. Constitutional: He is oriented to person, place, and time. He appears well-developed and well-nourished. No distress.  HENT:  Head: Normocephalic and atraumatic.  Right Ear: External ear normal.  Left Ear: External ear normal.  Mouth/Throat: Oropharynx is clear and moist. No oropharyngeal exudate.  Some blood in the left nostril  Eyes: Conjunctivae and EOM are normal. Pupils are equal, round, and reactive to light. Right eye exhibits no discharge. Left eye exhibits no discharge. No scleral icterus.  Neck: Normal range of motion. Neck supple. No tracheal deviation present. No thyromegaly present.  Cardiovascular: Normal rate, regular rhythm, normal heart sounds and intact distal pulses.  Exam reveals no gallop and no friction rub.   No murmur heard. At 60 per minute  Pulmonary/Chest: Effort normal and breath sounds normal. No respiratory distress. He has no wheezes. He has no rales. He exhibits no tenderness.  Abdominal: Soft. Bowel sounds are normal. He exhibits no mass. There is no tenderness. There is no rebound and no guarding.  Musculoskeletal: Normal range of  motion. He exhibits no edema and no tenderness.  Lymphadenopathy:    He has no cervical adenopathy.  Neurological: He is alert and oriented to person, place, and time. He has normal reflexes. No cranial nerve deficit.  Skin: Skin is warm and dry. No rash noted. No erythema. No pallor.  Psychiatric: He has a normal mood and affect. His behavior is normal. Judgment and thought content normal.   BP 139/69  Pulse 49   Temp(Src) 97.9 F (36.6 C) (Oral)  Ht 6\' 2"  (1.88 m)  Wt 156 lb (70.761 kg)  BMI 20.02 kg/m2        Assessment & Plan:  1. CAD, ARTERY BYPASS GRAFT  2. HYPERTENSION, UNSPECIFIED  3. HYPERCHOLESTEROLEMIA  IIA  4. Unspecified vitamin D deficiency  5. Diarrhea due to drug, resolved Meds ordered this encounter  Medications  . lovastatin (MEVACOR) 40 MG tablet    Sig: Take 1 tablet (40 mg total) by mouth every morning.    Dispense:  90 tablet    Refill:  3  . metoprolol (LOPRESSOR) 50 MG tablet    Sig: Take 0.5 tablets (25 mg total) by mouth 2 (two) times daily. 1/2 TAB BID    Dispense:  90 tablet    Refill:  3  . losartan (COZAAR) 50 MG tablet    Sig: Take 1 tablet (50 mg total) by mouth every morning.    Dispense:  90 tablet    Refill:  3   Patient Instructions  Continue current medications. Continue good therapeutic lifestyle changes which include good diet and exercise. Keep doing exactly what you've been doing for the past 3 months Fall precautions discussed with patient. Schedule your flu vaccine if you haven't had it yet If you are over 28 years old - you may need Prevnar 61 or the adult Pneumonia vaccine. Use saline nose spray frequently during the day Use a cool mist humidifier in her bedroom at nighttime    Arrie Senate MD

## 2013-05-20 ENCOUNTER — Other Ambulatory Visit (INDEPENDENT_AMBULATORY_CARE_PROVIDER_SITE_OTHER): Payer: Medicare HMO

## 2013-05-20 DIAGNOSIS — I10 Essential (primary) hypertension: Secondary | ICD-10-CM

## 2013-05-20 DIAGNOSIS — R5383 Other fatigue: Principal | ICD-10-CM

## 2013-05-20 DIAGNOSIS — R5381 Other malaise: Secondary | ICD-10-CM

## 2013-05-20 DIAGNOSIS — E559 Vitamin D deficiency, unspecified: Secondary | ICD-10-CM

## 2013-05-20 DIAGNOSIS — E785 Hyperlipidemia, unspecified: Secondary | ICD-10-CM

## 2013-05-20 LAB — POCT CBC
Granulocyte percent: 63.8 %G (ref 37–80)
HEMATOCRIT: 42.3 % — AB (ref 43.5–53.7)
HEMOGLOBIN: 13.3 g/dL — AB (ref 14.1–18.1)
Lymph, poc: 1.7 (ref 0.6–3.4)
MCH: 30.2 pg (ref 27–31.2)
MCHC: 31.5 g/dL — AB (ref 31.8–35.4)
MCV: 95.9 fL (ref 80–97)
MPV: 7.9 fL (ref 0–99.8)
POC Granulocyte: 3.6 (ref 2–6.9)
POC LYMPH PERCENT: 29.9 %L (ref 10–50)
Platelet Count, POC: 429 10*3/uL — AB (ref 142–424)
RBC: 4.4 M/uL — AB (ref 4.69–6.13)
RDW, POC: 14.8 %
WBC: 5.6 10*3/uL (ref 4.6–10.2)

## 2013-05-20 NOTE — Progress Notes (Signed)
Patient came in for labs only.

## 2013-05-21 LAB — BMP8+EGFR
BUN/Creatinine Ratio: 8 — ABNORMAL LOW (ref 10–22)
BUN: 7 mg/dL — ABNORMAL LOW (ref 8–27)
CO2: 24 mmol/L (ref 18–29)
CREATININE: 0.83 mg/dL (ref 0.76–1.27)
Calcium: 9.1 mg/dL (ref 8.6–10.2)
Chloride: 101 mmol/L (ref 97–108)
GFR, EST AFRICAN AMERICAN: 96 mL/min/{1.73_m2} (ref >59)
GFR, EST NON AFRICAN AMERICAN: 83 mL/min/{1.73_m2} (ref >59)
GLUCOSE: 89 mg/dL (ref 65–99)
POTASSIUM: 4.7 mmol/L (ref 3.5–5.2)
SODIUM: 138 mmol/L (ref 134–144)

## 2013-05-21 LAB — NMR, LIPOPROFILE
CHOLESTEROL: 164 mg/dL (ref <200)
HDL Cholesterol by NMR: 66 mg/dL (ref >=40)
HDL PARTICLE NUMBER: 33.9 umol/L (ref >=30.5)
LDL PARTICLE NUMBER: 918 nmol/L (ref <1000)
LDL SIZE: 20.7 nm (ref >20.5)
LDLC SERPL CALC-MCNC: 86 mg/dL (ref <100)
LP-IR SCORE: 32 (ref <=45)
SMALL LDL PARTICLE NUMBER: 343 nmol/L (ref <=527)
TRIGLYCERIDES BY NMR: 61 mg/dL (ref <150)

## 2013-05-21 LAB — HEPATIC FUNCTION PANEL
ALK PHOS: 56 IU/L (ref 39–117)
ALT: 11 IU/L (ref 0–44)
AST: 19 IU/L (ref 0–40)
Albumin: 4 g/dL (ref 3.5–4.7)
BILIRUBIN DIRECT: 0.1 mg/dL (ref 0.00–0.40)
BILIRUBIN TOTAL: 0.3 mg/dL (ref 0.0–1.2)
TOTAL PROTEIN: 5.8 g/dL — AB (ref 6.0–8.5)

## 2013-05-21 LAB — VITAMIN D 25 HYDROXY (VIT D DEFICIENCY, FRACTURES): Vit D, 25-Hydroxy: 37.4 ng/mL (ref 30.0–100.0)

## 2013-05-22 ENCOUNTER — Telehealth: Payer: Self-pay | Admitting: Family Medicine

## 2013-05-22 NOTE — Telephone Encounter (Signed)
Message copied by Waverly Ferrari on Wed May 22, 2013  9:03 AM ------      Message from: Chipper Herb      Created: Tue May 21, 2013  6:18 PM       Please schedule repeat CBC in 4 weeks      Blood sugar is good. Electrolytes are good and within normal limits. The kidney function tests are good.      All liver function tests are within normal limit      Cholesterol numbers by advanced lipid testing are excellent and at goal--- continue current treatment and aggressive therapeutic lifestyle changes      The vitamin D level was 37.4, increase vitamin D3 by 1000 daily       ------

## 2013-05-27 ENCOUNTER — Encounter: Payer: Self-pay | Admitting: Family Medicine

## 2013-05-27 ENCOUNTER — Ambulatory Visit (INDEPENDENT_AMBULATORY_CARE_PROVIDER_SITE_OTHER): Payer: Medicare HMO | Admitting: Family Medicine

## 2013-05-27 VITALS — BP 121/50 | HR 47 | Temp 97.5°F | Ht 74.0 in | Wt 162.0 lb

## 2013-05-27 DIAGNOSIS — I10 Essential (primary) hypertension: Secondary | ICD-10-CM

## 2013-05-27 DIAGNOSIS — D75839 Thrombocytosis, unspecified: Secondary | ICD-10-CM

## 2013-05-27 DIAGNOSIS — N400 Enlarged prostate without lower urinary tract symptoms: Secondary | ICD-10-CM

## 2013-05-27 DIAGNOSIS — R0989 Other specified symptoms and signs involving the circulatory and respiratory systems: Secondary | ICD-10-CM

## 2013-05-27 DIAGNOSIS — E7800 Pure hypercholesterolemia, unspecified: Secondary | ICD-10-CM

## 2013-05-27 DIAGNOSIS — D473 Essential (hemorrhagic) thrombocythemia: Secondary | ICD-10-CM

## 2013-05-27 DIAGNOSIS — I25810 Atherosclerosis of coronary artery bypass graft(s) without angina pectoris: Secondary | ICD-10-CM

## 2013-05-27 NOTE — Progress Notes (Signed)
Subjective:    Patient ID: Danny Loron., male    DOB: Feb 01, 1931, 80 y.o.   MRN: [id number]  HPI Pt here for follow up and management of chronic medical problems. The patient comes in today with no particular complaints. His lab work will be reviewed with him today. His lab work revealed a hemoglobin that was at the low end of the normal range. It also revealed an elevated platelet count. We will plan to repeat the CBC in 3-4 weeks. He is up-to-date on all of his other health maintenance issues.         Patient Active Problem List   Diagnosis Date Noted  . Enteritis due to Clostridium difficile, history of 11/21/2012  . AAA (abdominal aortic aneurysm) without rupture 05/18/2010  . Hyperplasia of prostate 05/18/2010  . Carotid stenosis 05/18/2010  . CAROTID BRUIT 01/06/2010  . HYPERCHOLESTEROLEMIA  IIA 05/19/2008  . SYNCOPE-CAROTID SINUS 05/19/2008  . BLURRED VISION 05/19/2008  . HYPERTENSION, UNSPECIFIED 05/19/2008  . CAD, ARTERY BYPASS GRAFT 05/19/2008  . SUBCLAVIAN STEAL SYNDROME 05/19/2008   Outpatient Encounter Prescriptions as of 05/27/2013  Medication Sig  . ALPRAZolam (XANAX) 0.25 MG tablet Take 1 tablet (0.25 mg total) by mouth at bedtime as needed.  [name] aspirin 325 MG EC tablet Take 325 mg by mouth every morning.   . Cholecalciferol (VITAMIN D-3 PO) Take 2,000 mg by mouth every morning.   [name] losartan (COZAAR) 50 MG tablet Take 1 tablet (50 mg total) by mouth every morning.  . lovastatin (MEVACOR) 40 MG tablet Take 1 tablet (40 mg total) by mouth every morning.  . metoprolol (LOPRESSOR) 50 MG tablet Take 0.5 tablets (25 mg total) by mouth 2 (two) times daily. 1/2 TAB BID  . Multiple Vitamin (MULTIVITAMIN) tablet Take 1 tablet by mouth every morning.   [name] NITROSTAT 0.4 MG SL tablet PLACE 1 TABLET (0.4 MG TOTAL) UNDER THE TONGUE EVERY 5 (FIVE) MINUTESAS NEEDED FOR CHEST PAIN.  [name] Omega-3 Fatty Acids (FISH OIL) 1000 MG CAPS Take 2 capsules by mouth every morning.   .  Probiotic Product (ALIGN) 4 MG CAPS Take by mouth.    Review of Systems  Constitutional: Negative.   HENT: Negative.   Eyes: Negative.   Respiratory: Negative.   Cardiovascular: Negative.   Gastrointestinal: Negative.   Endocrine: Negative.   Genitourinary: Negative.   Musculoskeletal: Negative.   Skin: Negative.   Allergic/Immunologic: Negative.   Neurological: Negative.   Hematological: Negative.   Psychiatric/Behavioral: Negative.        Objective:   Physical Exam  Nursing note and vitals reviewed. Constitutional: He is oriented to person, place, and time. He appears well-developed and well-nourished. No distress.  Pleasant and appears younger than his stated age of 77  HENT:  Head: Normocephalic and atraumatic.  Right Ear: External ear normal.  Left Ear: External ear normal.  Nose: Nose normal.  Mouth/Throat: Oropharynx is clear and moist. No oropharyngeal exudate.  Eyes: Conjunctivae and EOM are normal. Pupils are equal, round, and reactive to light. Right eye exhibits no discharge. Left eye exhibits no discharge. No scleral icterus.  Neck: Normal range of motion. Neck supple. No thyromegaly present.  Bilateral carotid bruits and right supraclavicular bruit, these are followed regularly by Doppler studies  Cardiovascular: Normal rate, regular rhythm, normal heart sounds and intact distal pulses.  Exam reveals no gallop and no friction rub.   No murmur heard. At 72 per minute  Pulmonary/Chest: Effort normal and breath sounds normal. No  respiratory distress. He has no wheezes. He has no rales. He exhibits no tenderness.  No axillary adenopathy  Abdominal: Soft. Bowel sounds are normal. He exhibits no mass. There is no tenderness. There is no rebound and no guarding.  No inguinal adenopathy  Musculoskeletal: Normal range of motion. He exhibits no edema and no tenderness.  Lymphadenopathy:    He has no cervical adenopathy.  Neurological: He is alert and oriented to  person, place, and time. He has normal reflexes.  Skin: Skin is warm and dry. No rash noted. No erythema. No pallor.  Psychiatric: He has a normal mood and affect. His behavior is normal. Judgment and thought content normal.   BP 121/50  Pulse 47  Temp(Src) 97.5 F (36.4 C) (Oral)  Ht 6\' 2"  (1.88 m)  Wt 162 lb (73.483 kg)  BMI 20.79 kg/m2        Assessment & Plan:  1. CAD, ARTERY BYPASS GRAFT -Keep regular followup with cardiologist  2. HYPERTENSION, UNSPECIFIED  3. HYPERCHOLESTEROLEMIA  IIA  4. Hyperplasia of prostate  5. Bilateral carotid bruits -Keep regular followup with cardiologist and regular Doppler monitor of bruits  6. Thrombocytosis -Repeat CBC in 4 weeks  Patient Instructions                       Medicare Annual Wellness Visit  [facility] and the medical providers at [facility] strive to bring you the best medical care.  In doing so we not only want to address your current medical conditions and concerns but also to detect new conditions early and prevent illness, disease and health-related problems.    Medicare offers a yearly Wellness Visit which allows our clinical staff to assess your need for preventative services including immunizations, lifestyle education, counseling to decrease risk of preventable diseases and screening for fall risk and other medical concerns.    This visit is provided free of charge (no copay) for all Medicare recipients. The clinical pharmacists at [facility] have begun to conduct these Wellness Visits which will also include a thorough review of all your medications.    As you primary medical provider recommend that you make an appointment for your Annual Wellness Visit if you have not done so already this year.  You may set up this appointment before you leave today or you may call back ([phone]) and schedule an appointment.  Please make sure when you call that you mention that you  are scheduling your Annual Wellness Visit with the clinical pharmacist so that the appointment may be made for the proper length of time.      Continue current medications. Continue good therapeutic lifestyle changes which include good diet and exercise. Fall precautions discussed with patient. If an FOBT was given today- please return it to our front desk. If you are over 56 years old - you may need Prevnar 72 or the adult Pneumonia vaccine.     Arrie Senate MD

## 2013-05-27 NOTE — Patient Instructions (Signed)
Medicare Annual Wellness Visit  [facility] and the medical providers at [facility] strive to bring you the best medical care.  In doing so we not only want to address your current medical conditions and concerns but also to detect new conditions early and prevent illness, disease and health-related problems.    Medicare offers a yearly Wellness Visit which allows our clinical staff to assess your need for preventative services including immunizations, lifestyle education, counseling to decrease risk of preventable diseases and screening for fall risk and other medical concerns.    This visit is provided free of charge (no copay) for all Medicare recipients. The clinical pharmacists at [facility] have begun to conduct these Wellness Visits which will also include a thorough review of all your medications.    As you primary medical provider recommend that you make an appointment for your Annual Wellness Visit if you have not done so already this year.  You may set up this appointment before you leave today or you may call back ([phone]) and schedule an appointment.  Please make sure when you call that you mention that you are scheduling your Annual Wellness Visit with the clinical pharmacist so that the appointment may be made for the proper length of time.      Continue current medications. Continue good therapeutic lifestyle changes which include good diet and exercise. Fall precautions discussed with patient. If an FOBT was given today- please return it to our front desk. If you are over 50 years old - you may need Prevnar 13 or the adult Pneumonia vaccine.   

## 2013-05-27 NOTE — Addendum Note (Signed)
Addended by: Zannie Cove on: 05/27/2013 11:41 AM   Modules accepted: Orders

## 2013-06-24 ENCOUNTER — Other Ambulatory Visit (INDEPENDENT_AMBULATORY_CARE_PROVIDER_SITE_OTHER): Payer: Medicare HMO

## 2013-06-24 DIAGNOSIS — D473 Essential (hemorrhagic) thrombocythemia: Secondary | ICD-10-CM

## 2013-06-24 DIAGNOSIS — D75839 Thrombocytosis, unspecified: Secondary | ICD-10-CM

## 2013-06-24 LAB — POCT CBC
GRANULOCYTE PERCENT: 69.2 % (ref 37–80)
HEMATOCRIT: 40.6 % — AB (ref 43.5–53.7)
HEMOGLOBIN: 13.5 g/dL — AB (ref 14.1–18.1)
LYMPH, POC: 1.5 (ref 0.6–3.4)
MCH: 31.8 pg — AB (ref 27–31.2)
MCHC: 33.3 g/dL (ref 31.8–35.4)
MCV: 95.4 fL (ref 80–97)
MPV: 6.7 fL (ref 0–99.8)
POC Granulocyte: 3.9 (ref 2–6.9)
POC LYMPH PERCENT: 27 %L (ref 10–50)
Platelet Count, POC: 256 10*3/uL (ref 142–424)
RBC: 4.3 M/uL — AB (ref 4.69–6.13)
RDW, POC: 13.5 %
WBC: 5.6 10*3/uL (ref 4.6–10.2)

## 2013-06-24 NOTE — Progress Notes (Signed)
Pt came in for lab  only 

## 2013-07-10 ENCOUNTER — Other Ambulatory Visit: Payer: Self-pay | Admitting: Family Medicine

## 2013-07-12 NOTE — Telephone Encounter (Signed)
Last seen 05/27/13, last filled 12/24/12, route to pool A, nurse to call in to [facility]

## 2013-07-12 NOTE — Telephone Encounter (Signed)
rx called to [facility] and patient notified

## 2013-07-12 NOTE — Telephone Encounter (Signed)
This is okay to refill 

## 2013-07-18 ENCOUNTER — Ambulatory Visit (INDEPENDENT_AMBULATORY_CARE_PROVIDER_SITE_OTHER): Payer: Medicare HMO | Admitting: Cardiovascular Disease

## 2013-07-18 ENCOUNTER — Encounter: Payer: Self-pay | Admitting: Cardiovascular Disease

## 2013-07-18 VITALS — BP 118/68 | HR 43 | Ht 74.0 in | Wt 161.0 lb

## 2013-07-18 DIAGNOSIS — I6529 Occlusion and stenosis of unspecified carotid artery: Secondary | ICD-10-CM

## 2013-07-18 DIAGNOSIS — I10 Essential (primary) hypertension: Secondary | ICD-10-CM

## 2013-07-18 DIAGNOSIS — I6521 Occlusion and stenosis of right carotid artery: Secondary | ICD-10-CM

## 2013-07-18 DIAGNOSIS — I25810 Atherosclerosis of coronary artery bypass graft(s) without angina pectoris: Secondary | ICD-10-CM

## 2013-07-18 DIAGNOSIS — E7800 Pure hypercholesterolemia, unspecified: Secondary | ICD-10-CM

## 2013-07-18 NOTE — Patient Instructions (Signed)
Your physician wants you to follow-up in: 6 months You will receive a reminder letter in the mail two months in advance. If you don't receive a letter, please call our office to schedule the follow-up appointment.     Your physician has requested that you have a carotid duplex. This test is an ultrasound of the carotid arteries in your neck. It looks at blood flow through these arteries that supply the brain with blood. Allow one hour for this exam. There are no restrictions or special instructions.    Your physician recommends that you continue on your current medications as directed. Please refer to the Current Medication list given to you today.     Thank you for choosing [facility] !        

## 2013-07-18 NOTE — Assessment & Plan Note (Signed)
Stable with no angina and good activity level.  Continue medical Rx  

## 2013-07-18 NOTE — Assessment & Plan Note (Signed)
Well controlled.  Continue current medications and low sodium Dash type diet.    

## 2013-07-18 NOTE — Progress Notes (Signed)
Patient ID: Danny Pender., male   DOB: 06-07-1931, 80 y.o.   MRN: [id number] Danny [name] is seen today for distant CABG, 2003 carotid disease with left CEA and right subclavian bypass. in 2009 He has HTN and elevated lipids. last visit he had had a syncopal episode. There was no cardiac etiology found. He has great exercise tolerance with no angina. He has improved strength in his right arm and no TIA symptoms. He has residual right carotid disease. Reviewed last duplex from 01/09/12 and right subclavian bypass ok as is left CEA. 60-79% RICA stenosis with with systolic velocity over 13m/sec needs f/u  Compliant with meds  Labs reviewed from [facility] 02/03/10 ok except mild elevation in ALT/SGPT 79 ( 0-65) Due to have labs next month. Needs nitro  Carotid 05/17/12 60% -79% RICA patent bypass to rigtht arm 40-08% LICA       ROS: Denies fever, malais, weight loss, blurry vision, decreased visual acuity, cough, sputum, SOB, hemoptysis, pleuritic pain, palpitaitons, heartburn, abdominal pain, melena, lower extremity edema, claudication, or rash.  All other systems reviewed and negative  General: Affect appropriate Healthy:  appears stated age 39: normal Neck supple with no adenopathy JVP normal bilateral  bruits no thyromegaly Lungs clear with no wheezing and good diaphragmatic motion Heart:  S1/S2 no murmur, no rub, gallop or click PMI normal  Right carotid subclavian bypass  Abdomen: benighn, BS positve, no tenderness, no AAA no bruit.  No HSM or HJR Distal pulses intact with no bruits No edema Neuro non-focal Skin warm and dry No muscular weakness   Current Outpatient Prescriptions  Medication Sig Dispense Refill  . ALPRAZolam (XANAX) 0.25 MG tablet TAKE ONE TABLET BY MOUTH AT BEDTIME AS NEEDED  30 tablet  2  . aspirin 325 MG EC tablet Take 325 mg by mouth every morning.       . Cholecalciferol (VITAMIN D-3 PO) Take 2,000 mg by mouth every morning.       [name] losartan (COZAAR) 50 MG tablet  Take 1 tablet (50 mg total) by mouth every morning.  90 tablet  3  . lovastatin (MEVACOR) 40 MG tablet Take 1 tablet (40 mg total) by mouth every morning.  90 tablet  3  . metoprolol (LOPRESSOR) 50 MG tablet Take 0.5 tablets (25 mg total) by mouth 2 (two) times daily. 1/2 TAB BID  90 tablet  3  . Multiple Vitamin (MULTIVITAMIN) tablet Take 1 tablet by mouth every morning.       [name] NITROSTAT 0.4 MG SL tablet PLACE 1 TABLET (0.4 MG TOTAL) UNDER THE TONGUE EVERY 5 (FIVE) MINUTESAS NEEDED FOR CHEST PAIN.  25 tablet  2  . Omega-3 Fatty Acids (FISH OIL) 1000 MG CAPS Take 2 capsules by mouth every morning.       . Probiotic Product (ALIGN) 4 MG CAPS Take by mouth.       No current facility-administered medications for this visit.    Allergies  Doxycycline; Contrast media; Eszopiclone; and Lunesta  Electrocardiogram:  SR rate 60 LAE otherwise normal   Assessment and Plan

## 2013-07-18 NOTE — Assessment & Plan Note (Signed)
Cholesterol is at goal.  Continue current dose of statin and diet Rx.  No myalgias or side effects.  F/U  LFT's in 6 months. Lab Results  Component Value Date   LDLCALC 86 05/20/2013

## 2013-07-18 NOTE — Assessment & Plan Note (Signed)
52/48 18-59% LICA stenosis  F/u duplex ASA

## 2013-09-12 ENCOUNTER — Other Ambulatory Visit (INDEPENDENT_AMBULATORY_CARE_PROVIDER_SITE_OTHER): Payer: Medicare HMO

## 2013-09-12 DIAGNOSIS — E785 Hyperlipidemia, unspecified: Secondary | ICD-10-CM

## 2013-09-12 DIAGNOSIS — I10 Essential (primary) hypertension: Secondary | ICD-10-CM

## 2013-09-12 DIAGNOSIS — E559 Vitamin D deficiency, unspecified: Secondary | ICD-10-CM

## 2013-09-12 LAB — POCT CBC
GRANULOCYTE PERCENT: 82.4 % — AB (ref 37–80)
HCT, POC: 40.6 % — AB (ref 43.5–53.7)
HEMOGLOBIN: 14 g/dL — AB (ref 14.1–18.1)
LYMPH, POC: 1.4 (ref 0.6–3.4)
MCH: 32.5 pg — AB (ref 27–31.2)
MCHC: 34.5 g/dL (ref 31.8–35.4)
MCV: 94.1 fL (ref 80–97)
MPV: 7.2 fL (ref 0–99.8)
PLATELET COUNT, POC: 252 10*3/uL (ref 142–424)
POC GRANULOCYTE: 7.7 — AB (ref 2–6.9)
POC LYMPH %: 14.7 % (ref 10–50)
RBC: 4.3 M/uL — AB (ref 4.69–6.13)
RDW, POC: 13.9 %
WBC: 9.4 10*3/uL (ref 4.6–10.2)

## 2013-09-13 LAB — NMR, LIPOPROFILE
CHOLESTEROL: 181 mg/dL (ref 100–199)
HDL CHOLESTEROL BY NMR: 58 mg/dL (ref >39)
HDL Particle Number: 30.3 umol/L — ABNORMAL LOW (ref >=30.5)
LDL PARTICLE NUMBER: 1018 nmol/L — AB (ref <1000)
LDL SIZE: 21.6 nm (ref >20.5)
LDLC SERPL CALC-MCNC: 109 mg/dL — ABNORMAL HIGH (ref 0–99)
LP-IR Score: <25 (ref <=45)
SMALL LDL PARTICLE NUMBER: 246 nmol/L (ref <=527)
TRIGLYCERIDES BY NMR: 72 mg/dL (ref 0–149)

## 2013-09-13 LAB — BMP8+EGFR
BUN/Creatinine Ratio: 11 (ref 10–22)
BUN: 12 mg/dL (ref 8–27)
CALCIUM: 9.3 mg/dL (ref 8.6–10.2)
CO2: 24 mmol/L (ref 18–29)
CREATININE: 1.07 mg/dL (ref 0.76–1.27)
Chloride: 100 mmol/L (ref 97–108)
GFR calc Af Amer: 75 mL/min/{1.73_m2} (ref >59)
GFR, EST NON AFRICAN AMERICAN: 65 mL/min/{1.73_m2} (ref >59)
GLUCOSE: 84 mg/dL (ref 65–99)
Potassium: 5.3 mmol/L — ABNORMAL HIGH (ref 3.5–5.2)
SODIUM: 139 mmol/L (ref 134–144)

## 2013-09-13 LAB — HEPATIC FUNCTION PANEL
ALT: 12 IU/L (ref 0–44)
AST: 18 IU/L (ref 0–40)
Albumin: 4.1 g/dL (ref 3.5–4.7)
Alkaline Phosphatase: 57 IU/L (ref 39–117)
BILIRUBIN DIRECT: 0.1 mg/dL (ref 0.00–0.40)
BILIRUBIN TOTAL: 0.3 mg/dL (ref 0.0–1.2)
TOTAL PROTEIN: 6 g/dL (ref 6.0–8.5)

## 2013-09-13 LAB — VITAMIN D 25 HYDROXY (VIT D DEFICIENCY, FRACTURES): VIT D 25 HYDROXY: 57.3 ng/mL (ref 30.0–100.0)

## 2013-09-18 ENCOUNTER — Encounter: Payer: Self-pay | Admitting: Family Medicine

## 2013-09-18 ENCOUNTER — Ambulatory Visit (INDEPENDENT_AMBULATORY_CARE_PROVIDER_SITE_OTHER): Payer: Medicare HMO | Admitting: Family Medicine

## 2013-09-18 VITALS — BP 149/63 | HR 49 | Temp 97.6°F | Ht 74.0 in | Wt 162.0 lb

## 2013-09-18 DIAGNOSIS — E7800 Pure hypercholesterolemia, unspecified: Secondary | ICD-10-CM

## 2013-09-18 DIAGNOSIS — I25810 Atherosclerosis of coronary artery bypass graft(s) without angina pectoris: Secondary | ICD-10-CM

## 2013-09-18 DIAGNOSIS — I10 Essential (primary) hypertension: Secondary | ICD-10-CM

## 2013-09-18 DIAGNOSIS — R0989 Other specified symptoms and signs involving the circulatory and respiratory systems: Secondary | ICD-10-CM

## 2013-09-18 DIAGNOSIS — R7989 Other specified abnormal findings of blood chemistry: Secondary | ICD-10-CM

## 2013-09-18 MED ORDER — LOSARTAN POTASSIUM 50 MG PO TABS: 50.0000 mg | ORAL_TABLET | Freq: Every morning | ORAL | Status: DC

## 2013-09-18 MED ORDER — METOPROLOL TARTRATE 50 MG PO TABS: 25.0000 mg | ORAL_TABLET | Freq: Two times a day (BID) | ORAL | Status: DC

## 2013-09-18 MED ORDER — ALPRAZOLAM 0.25 MG PO TABS: ORAL_TABLET | ORAL | Status: DC

## 2013-09-18 MED ORDER — LOVASTATIN 40 MG PO TABS: 40.0000 mg | ORAL_TABLET | Freq: Every morning | ORAL | Status: DC

## 2013-09-18 NOTE — Progress Notes (Signed)
Subjective:    Patient ID: Danny [name]., male    DOB: 1931/09/20, 80 y.o.   MRN: [id number]  HPI Pt here for follow up and management of chronic medical problems. The patient comes in today with no specific complaints. Recent lab work was reviewed with the patient. The patient did have a slightly increased potassium and this will be repeated today. He admits to not following his diet as closely as he could and his fondness for desserts. He sees a cardiologist regularly and the cardiologist normally  listens to his bruits in his neck.       Patient Active Problem List   Diagnosis Date Noted  . Bilateral carotid bruits 05/27/2013  . Enteritis due to Clostridium difficile, history of 11/21/2012  . AAA (abdominal aortic aneurysm) without rupture 05/18/2010  . Hyperplasia of prostate 05/18/2010  . Carotid stenosis 05/18/2010  . CAROTID BRUIT 01/06/2010  . HYPERCHOLESTEROLEMIA  IIA 05/19/2008  . SYNCOPE-CAROTID SINUS 05/19/2008  . BLURRED VISION 05/19/2008  . HYPERTENSION, UNSPECIFIED 05/19/2008  . CAD, ARTERY BYPASS GRAFT 05/19/2008  . SUBCLAVIAN STEAL SYNDROME 05/19/2008   Outpatient Encounter Prescriptions as of 09/18/2013  Medication Sig  . ALPRAZolam (XANAX) 0.25 MG tablet TAKE ONE TABLET BY MOUTH AT BEDTIME AS NEEDED  . aspirin 325 MG EC tablet Take 325 mg by mouth every morning.   . Cholecalciferol (VITAMIN D-3 PO) Take 2,000 mg by mouth every morning.   [name] losartan (COZAAR) 50 MG tablet Take 1 tablet (50 mg total) by mouth every morning.  . lovastatin (MEVACOR) 40 MG tablet Take 1 tablet (40 mg total) by mouth every morning.  . metoprolol (LOPRESSOR) 50 MG tablet Take 0.5 tablets (25 mg total) by mouth 2 (two) times daily. 1/2 TAB BID  . Multiple Vitamin (MULTIVITAMIN) tablet Take 1 tablet by mouth every morning.   [name] NITROSTAT 0.4 MG SL tablet PLACE 1 TABLET (0.4 MG TOTAL) UNDER THE TONGUE EVERY 5 (FIVE) MINUTESAS NEEDED FOR CHEST PAIN.  [name] Omega-3 Fatty Acids (FISH OIL)  1000 MG CAPS Take 2 capsules by mouth every morning.   . Probiotic Product (ALIGN) 4 MG CAPS Take by mouth.    Review of Systems  Constitutional: Negative.   HENT: Negative.   Eyes: Negative.   Respiratory: Negative.   Cardiovascular: Negative.   Gastrointestinal: Negative.   Endocrine: Negative.   Genitourinary: Negative.   Musculoskeletal: Negative.   Skin: Negative.   Allergic/Immunologic: Negative.   Neurological: Negative.   Hematological: Negative.   Psychiatric/Behavioral: Negative.        Objective:   Physical Exam  Nursing note and vitals reviewed. Constitutional: He is oriented to person, place, and time. He appears well-developed and well-nourished. No distress.  The patient is pleasant and alert  HENT:  Head: Normocephalic and atraumatic.  Right Ear: External ear normal.  Left Ear: External ear normal.  Nose: Nose normal.  Mouth/Throat: Oropharynx is clear and moist. No oropharyngeal exudate.  Eyes: Conjunctivae and EOM are normal. Pupils are equal, round, and reactive to light. Right eye exhibits no discharge. Left eye exhibits no discharge. No scleral icterus.  Neck: Normal range of motion. Neck supple. No tracheal deviation present. No thyromegaly present.  The patient has bilateral carotid bruits and supraclavicular bruit  Cardiovascular: Normal rate, regular rhythm, normal heart sounds and intact distal pulses.  Exam reveals no gallop and no friction rub.   No murmur heard. At 60 per minute  Pulmonary/Chest: Effort normal and breath sounds normal. No respiratory  distress. He has no wheezes. He has no rales. He exhibits no tenderness.  There was no axillary adenopathy. The patient has a midline chest incision from a previous CABG  Abdominal: Soft. Bowel sounds are normal. He exhibits no mass. There is no tenderness. There is no rebound and no guarding.  There are no abdominal bruits.  Musculoskeletal: Normal range of motion. He exhibits no edema and no  tenderness.  Lymphadenopathy:    He has no cervical adenopathy.  Neurological: He is alert and oriented to person, place, and time. He has normal reflexes.  Skin: Skin is warm and dry. No rash noted. No erythema. No pallor.  Psychiatric: He has a normal mood and affect. His behavior is normal. Judgment and thought content normal.    BP 149/63  Pulse 49  Temp(Src) 97.6 F (36.4 C) (Oral)  Ht 6\' 2"  (1.88 m)  Wt 162 lb (73.483 kg)  BMI 20.79 kg/m2       Assessment & Plan:  1. HYPERTENSION, UNSPECIFIED  2. HYPERCHOLESTEROLEMIA  IIA  3. Atherosclerosis of coronary artery bypass graft of native heart without angina pectoris  4. Bilateral carotid bruits  Meds ordered this encounter  Medications  . ALPRAZolam (XANAX) 0.25 MG tablet    Sig: TAKE ONE TABLET BY MOUTH AT BEDTIME AS NEEDED    Dispense:  30 tablet    Refill:  3  . lovastatin (MEVACOR) 40 MG tablet    Sig: Take 1 tablet (40 mg total) by mouth every morning.    Dispense:  90 tablet    Refill:  3  . losartan (COZAAR) 50 MG tablet    Sig: Take 1 tablet (50 mg total) by mouth every morning.    Dispense:  90 tablet    Refill:  3  . metoprolol (LOPRESSOR) 50 MG tablet    Sig: Take 0.5 tablets (25 mg total) by mouth 2 (two) times daily. 1/2 TAB BID    Dispense:  90 tablet    Refill:  3   Patient Instructions                       Medicare Annual Wellness Visit  [facility] and the medical providers at [facility] strive to bring you the best medical care.  In doing so we not only want to address your current medical conditions and concerns but also to detect new conditions early and prevent illness, disease and health-related problems.    Medicare offers a yearly Wellness Visit which allows our clinical staff to assess your need for preventative services including immunizations, lifestyle education, counseling to decrease risk of preventable diseases and screening for fall risk and other medical  concerns.    This visit is provided free of charge (no copay) for all Medicare recipients. The clinical pharmacists at [facility] have begun to conduct these Wellness Visits which will also include a thorough review of all your medications.    As you primary medical provider recommend that you make an appointment for your Annual Wellness Visit if you have not done so already this year.  You may set up this appointment before you leave today or you may call back ([phone]) and schedule an appointment.  Please make sure when you call that you mention that you are scheduling your Annual Wellness Visit with the clinical pharmacist so that the appointment may be made for the proper length of time.     Continue current medications.  Continue good therapeutic lifestyle changes which include good diet and exercise. Fall precautions discussed with patient. If an FOBT was given today- please return it to our front desk. If you are over 75 years old - you may need Prevnar 53 or the adult Pneumonia vaccine.  Flu Shots will be available at our office starting mid- September. Please call and schedule a FLU CLINIC APPOINTMENT.   Please try to leave the desserts off Continue to drink plenty of fluids   Arrie Senate MD

## 2013-09-18 NOTE — Patient Instructions (Addendum)
Medicare Annual Wellness Visit  [facility] and the medical providers at [facility] strive to bring you the best medical care.  In doing so we not only want to address your current medical conditions and concerns but also to detect new conditions early and prevent illness, disease and health-related problems.    Medicare offers a yearly Wellness Visit which allows our clinical staff to assess your need for preventative services including immunizations, lifestyle education, counseling to decrease risk of preventable diseases and screening for fall risk and other medical concerns.    This visit is provided free of charge (no copay) for all Medicare recipients. The clinical pharmacists at [facility] have begun to conduct these Wellness Visits which will also include a thorough review of all your medications.    As you primary medical provider recommend that you make an appointment for your Annual Wellness Visit if you have not done so already this year.  You may set up this appointment before you leave today or you may call back [phone]) and schedule an appointment.  Please make sure when you call that you mention that you are scheduling your Annual Wellness Visit with the clinical pharmacist so that the appointment may be made for the proper length of time.     Continue current medications. Continue good therapeutic lifestyle changes which include good diet and exercise. Fall precautions discussed with patient. If an FOBT was given today- please return it to our front desk. If you are over 6 years old - you may need Prevnar 20 or the adult Pneumonia vaccine.  Flu Shots will be available at our office starting mid- September. Please call and schedule a FLU CLINIC APPOINTMENT.   Please try to leave the desserts off Continue to drink plenty of fluids

## 2013-09-19 LAB — BMP8+EGFR
BUN/Creatinine Ratio: 9 — ABNORMAL LOW (ref 10–22)
BUN: 11 mg/dL (ref 8–27)
CO2: 27 mmol/L (ref 18–29)
Calcium: 9.6 mg/dL (ref 8.6–10.2)
Chloride: 99 mmol/L (ref 97–108)
Creatinine, Ser: 1.18 mg/dL (ref 0.76–1.27)
GFR calc Af Amer: 67 mL/min/1.73
GFR calc non Af Amer: 58 mL/min/1.73 — ABNORMAL LOW
Glucose: 88 mg/dL (ref 65–99)
Potassium: 5.6 mmol/L — ABNORMAL HIGH (ref 3.5–5.2)
Sodium: 138 mmol/L (ref 134–144)

## 2013-10-01 ENCOUNTER — Other Ambulatory Visit (INDEPENDENT_AMBULATORY_CARE_PROVIDER_SITE_OTHER): Payer: Medicare HMO

## 2013-10-01 DIAGNOSIS — R7989 Other specified abnormal findings of blood chemistry: Secondary | ICD-10-CM

## 2013-10-02 LAB — BMP8+EGFR
BUN/Creatinine Ratio: 14 (ref 10–22)
BUN: 14 mg/dL (ref 8–27)
CALCIUM: 8.9 mg/dL (ref 8.6–10.2)
CO2: 25 mmol/L (ref 18–29)
CREATININE: 0.97 mg/dL (ref 0.76–1.27)
Chloride: 98 mmol/L (ref 97–108)
GFR calc Af Amer: 85 mL/min/{1.73_m2} (ref >59)
GFR, EST NON AFRICAN AMERICAN: 73 mL/min/{1.73_m2} (ref >59)
Glucose: 99 mg/dL (ref 65–99)
Potassium: 4.2 mmol/L (ref 3.5–5.2)
SODIUM: 137 mmol/L (ref 134–144)

## 2013-11-04 ENCOUNTER — Ambulatory Visit (INDEPENDENT_AMBULATORY_CARE_PROVIDER_SITE_OTHER): Payer: Medicare HMO

## 2013-11-04 DIAGNOSIS — Z23 Encounter for immunization: Secondary | ICD-10-CM

## 2013-12-23 ENCOUNTER — Ambulatory Visit (INDEPENDENT_AMBULATORY_CARE_PROVIDER_SITE_OTHER): Payer: Commercial Managed Care - HMO | Admitting: Family Medicine

## 2013-12-23 ENCOUNTER — Encounter: Payer: Self-pay | Admitting: Family Medicine

## 2013-12-23 VITALS — BP 142/60 | HR 55 | Temp 96.9°F | Ht 74.0 in | Wt 163.2 lb

## 2013-12-23 DIAGNOSIS — C4490 Unspecified malignant neoplasm of skin, unspecified: Secondary | ICD-10-CM

## 2013-12-23 NOTE — Progress Notes (Signed)
   Subjective:    Patient ID: Danny Loron., male    DOB: 07-Jul-1931, 80 y.o.   MRN: [id number]  HPI Patient has a skin lesion on his left elbow that is enlarging and not healing.  He has hx of skin cancer and has seen Dr. Nevada Crane Dermatologist in [place] in past.  Review of Systems  Constitutional: Negative for fever.  HENT: Negative for ear pain.   Eyes: Negative for discharge.  Respiratory: Negative for cough.   Cardiovascular: Negative for chest pain.  Gastrointestinal: Negative for abdominal distention.  Endocrine: Negative for polyuria.  Genitourinary: Negative for difficulty urinating.  Musculoskeletal: Negative for gait problem and neck pain.  Skin: Negative for color change and rash.  Neurological: Negative for speech difficulty and headaches.  Psychiatric/Behavioral: Negative for agitation.       Objective:    BP 142/60 mmHg  Pulse 55  Temp(Src) 96.9 F (36.1 C) (Oral)  Ht 6\' 2"  (1.88 m)  Wt 163 lb 3.2 oz (74.027 kg)  BMI 20.94 kg/m2 Physical Exam  Constitutional: He is oriented to person, place, and time. He appears well-developed and well-nourished.  HENT:  Head: Normocephalic and atraumatic.  Mouth/Throat: Oropharynx is clear and moist.  Eyes: Pupils are equal, round, and reactive to light.  Neck: Normal range of motion. Neck supple.  Cardiovascular: Normal rate and regular rhythm.   No murmur heard. Pulmonary/Chest: Effort normal and breath sounds normal.  Abdominal: Soft. Bowel sounds are normal. There is no tenderness.  Neurological: He is alert and oriented to person, place, and time.  Skin: Skin is warm and dry.  1 cm diameter erythematous with dry scaly central skin mass left elbow  Psychiatric: He has a normal mood and affect.          Assessment & Plan:     ICD-9-CM ICD-10-CM   1. Skin cancer 173.90 C44.90      No Follow-up on file.  Lysbeth Penner FNP

## 2013-12-25 ENCOUNTER — Telehealth: Payer: Self-pay | Admitting: Family Medicine

## 2014-01-06 ENCOUNTER — Other Ambulatory Visit (INDEPENDENT_AMBULATORY_CARE_PROVIDER_SITE_OTHER): Payer: Commercial Managed Care - HMO

## 2014-01-06 DIAGNOSIS — I25810 Atherosclerosis of coronary artery bypass graft(s) without angina pectoris: Secondary | ICD-10-CM

## 2014-01-06 DIAGNOSIS — E7800 Pure hypercholesterolemia, unspecified: Secondary | ICD-10-CM

## 2014-01-06 DIAGNOSIS — E559 Vitamin D deficiency, unspecified: Secondary | ICD-10-CM

## 2014-01-06 DIAGNOSIS — N400 Enlarged prostate without lower urinary tract symptoms: Secondary | ICD-10-CM

## 2014-01-06 LAB — POCT CBC
Granulocyte percent: 58 %G (ref 37–80)
HCT, POC: 43.3 % — AB (ref 43.5–53.7)
Hemoglobin: 14.1 g/dL (ref 14.1–18.1)
Lymph, poc: 2.2 (ref 0.6–3.4)
MCH, POC: 30.8 pg (ref 27–31.2)
MCHC: 32.6 g/dL (ref 31.8–35.4)
MCV: 94.4 fL (ref 80–97)
MPV: 8 fL (ref 0–99.8)
POC Granulocyte: 3.7 (ref 2–6.9)
POC LYMPH %: 34.3 % (ref 10–50)
Platelet Count, POC: 262 10*3/uL (ref 142–424)
RBC: 4.6 M/uL — AB (ref 4.69–6.13)
RDW, POC: 14.2 %
WBC: 6.3 10*3/uL (ref 4.6–10.2)

## 2014-01-07 LAB — BMP8+EGFR
BUN/Creatinine Ratio: 13 (ref 10–22)
BUN: 12 mg/dL (ref 8–27)
CO2: 26 mmol/L (ref 18–29)
Calcium: 9.3 mg/dL (ref 8.6–10.2)
Chloride: 100 mmol/L (ref 97–108)
Creatinine, Ser: 0.96 mg/dL (ref 0.76–1.27)
GFR calc Af Amer: 86 mL/min/{1.73_m2} (ref >59)
GFR calc non Af Amer: 74 mL/min/{1.73_m2} (ref >59)
GLUCOSE: 89 mg/dL (ref 65–99)
Potassium: 4.5 mmol/L (ref 3.5–5.2)
Sodium: 138 mmol/L (ref 134–144)

## 2014-01-07 LAB — NMR, LIPOPROFILE
Cholesterol: 185 mg/dL (ref 100–199)
HDL Cholesterol by NMR: 62 mg/dL (ref >39)
HDL Particle Number: 32.4 umol/L (ref >=30.5)
LDL Particle Number: 956 nmol/L (ref <1000)
LDL SIZE: 21.1 nm (ref >20.5)
LDL-C: 106 mg/dL — ABNORMAL HIGH (ref 0–99)
SMALL LDL PARTICLE NUMBER: 292 nmol/L (ref <=527)
Triglycerides by NMR: 87 mg/dL (ref 0–149)

## 2014-01-07 LAB — HEPATIC FUNCTION PANEL
ALT: 14 IU/L (ref 0–44)
AST: 19 IU/L (ref 0–40)
Albumin: 4 g/dL (ref 3.5–4.7)
Alkaline Phosphatase: 59 IU/L (ref 39–117)
Bilirubin, Direct: 0.1 mg/dL (ref 0.00–0.40)
TOTAL PROTEIN: 6.2 g/dL (ref 6.0–8.5)
Total Bilirubin: 0.3 mg/dL (ref 0.0–1.2)

## 2014-01-07 LAB — VITAMIN D 25 HYDROXY (VIT D DEFICIENCY, FRACTURES): Vit D, 25-Hydroxy: 54.7 ng/mL (ref 30.0–100.0)

## 2014-01-08 NOTE — Telephone Encounter (Signed)
Aware of results. 

## 2014-01-09 ENCOUNTER — Telehealth: Payer: Self-pay

## 2014-01-09 NOTE — Telephone Encounter (Signed)
Letter sent with results

## 2014-01-09 NOTE — Telephone Encounter (Signed)
-----   Message from Chipper Herb, MD sent at 01/08/2014  9:02 PM EST ----- Please call this patient and let him know the results of this lab work

## 2014-01-14 ENCOUNTER — Encounter: Payer: Self-pay | Admitting: Family Medicine

## 2014-01-14 ENCOUNTER — Ambulatory Visit (INDEPENDENT_AMBULATORY_CARE_PROVIDER_SITE_OTHER): Payer: Commercial Managed Care - HMO | Admitting: Family Medicine

## 2014-01-14 ENCOUNTER — Ambulatory Visit (INDEPENDENT_AMBULATORY_CARE_PROVIDER_SITE_OTHER): Payer: Commercial Managed Care - HMO

## 2014-01-14 VITALS — BP 123/58 | HR 51 | Temp 97.1°F | Ht 74.0 in | Wt 163.0 lb

## 2014-01-14 DIAGNOSIS — E7800 Pure hypercholesterolemia, unspecified: Secondary | ICD-10-CM

## 2014-01-14 DIAGNOSIS — R0989 Other specified symptoms and signs involving the circulatory and respiratory systems: Secondary | ICD-10-CM

## 2014-01-14 DIAGNOSIS — C44629 Squamous cell carcinoma of skin of left upper limb, including shoulder: Secondary | ICD-10-CM

## 2014-01-14 DIAGNOSIS — I700 Atherosclerosis of aorta: Secondary | ICD-10-CM | POA: Insufficient documentation

## 2014-01-14 DIAGNOSIS — J302 Other seasonal allergic rhinitis: Secondary | ICD-10-CM

## 2014-01-14 DIAGNOSIS — I25810 Atherosclerosis of coronary artery bypass graft(s) without angina pectoris: Secondary | ICD-10-CM

## 2014-01-14 DIAGNOSIS — IMO0002 Reserved for concepts with insufficient information to code with codable children: Secondary | ICD-10-CM

## 2014-01-14 DIAGNOSIS — C801 Malignant (primary) neoplasm, unspecified: Secondary | ICD-10-CM

## 2014-01-14 DIAGNOSIS — N400 Enlarged prostate without lower urinary tract symptoms: Secondary | ICD-10-CM

## 2014-01-14 DIAGNOSIS — I739 Peripheral vascular disease, unspecified: Secondary | ICD-10-CM

## 2014-01-14 HISTORY — DX: Squamous cell carcinoma of skin of left upper limb, including shoulder: C44.629

## 2014-01-14 MED ORDER — ALPRAZOLAM 0.25 MG PO TABS: ORAL_TABLET | ORAL | Status: DC

## 2014-01-14 MED ORDER — FLUTICASONE PROPIONATE 50 MCG/ACT NA SUSP: 2.0000 | Freq: Every day | NASAL | Status: DC

## 2014-01-14 NOTE — Patient Instructions (Addendum)
Medicare Annual Wellness Visit  [facility] and the medical providers at [facility] strive to bring you the best medical care.  In doing so we not only want to address your current medical conditions and concerns but also to detect new conditions early and prevent illness, disease and health-related problems.    Medicare offers a yearly Wellness Visit which allows our clinical staff to assess your need for preventative services including immunizations, lifestyle education, counseling to decrease risk of preventable diseases and screening for fall risk and other medical concerns.    This visit is provided free of charge (no copay) for all Medicare recipients. The clinical pharmacists at [facility] have begun to conduct these Wellness Visits which will also include a thorough review of all your medications.    As you primary medical provider recommend that you make an appointment for your Annual Wellness Visit if you have not done so already this year.  You may set up this appointment before you leave today or you may call back ([phone]) and schedule an appointment.  Please make sure when you call that you mention that you are scheduling your Annual Wellness Visit with the clinical pharmacist so that the appointment may be made for the proper length of time.     Continue current medications. Continue good therapeutic lifestyle changes which include good diet and exercise. Fall precautions discussed with patient. If an FOBT was given today- please return it to our front desk. If you are over 1 years old - you may need Prevnar 11 or the adult Pneumonia vaccine.  Flu Shots will be available at our office starting mid- September. Please call and schedule a FLU CLINIC APPOINTMENT.   Change wound dressings daily as directed by dermatologist  You can use a Betadine solution through the weekend and after that just use plain saline for  which we will call a prescription in. Use steroid nose spray 1-2 sprays each nostril at bedtime, discontinue the otherthat you have been using. The patient needs follow-up with the cardiologist and may need repeat Dopplers--- he will be instructed to call the cardiologist and get an appointment and find out when he needs to get these repeat Dopplers done. Follow-up with dermatologist in one month

## 2014-01-14 NOTE — Progress Notes (Signed)
Subjective:    Patient ID: Danny Loron., male    DOB: Apr 27, 1931, 80 y.o.   MRN: [id number]  HPI Pt here for follow up and management of chronic medical problems. 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 The dermatologist is requesting that we do a chest x-ray for purposes of this invasive squamous cell carcinoma. The patient also complains of a stuffy nose. He is requesting a refill on his Xanax. We will go over his most recent lab work with him today and he is due to return an FOBT. The patient has been using a nasal spray that can cause increased heart rate and palpitations. This is because he gets congested in his head every night. The wound from the skin cancer removal was also reevaluated during the visit and the dressing was changed.        Patient Active Problem List   Diagnosis Date Noted  . Bilateral carotid bruits 05/27/2013  . Enteritis due to Clostridium difficile, history of 11/21/2012  . AAA (abdominal aortic aneurysm) without rupture 05/18/2010  . Hyperplasia of prostate 05/18/2010  . Carotid stenosis 05/18/2010  . CAROTID BRUIT 01/06/2010  . HYPERCHOLESTEROLEMIA  IIA 05/19/2008  . SYNCOPE-CAROTID SINUS 05/19/2008  . BLURRED VISION 05/19/2008  . HYPERTENSION, UNSPECIFIED 05/19/2008  . CAD, ARTERY BYPASS GRAFT 05/19/2008  . 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 ALPRAZolam (XANAX) 0.25 MG tablet TAKE ONE TABLET BY MOUTH AT BEDTIME AS NEEDED  . aspirin 325 MG EC tablet Take 325 mg by mouth every morning.   . Cholecalciferol (VITAMIN D-3 PO) Take 2,000 mg by mouth every morning.   [name] losartan (COZAAR) 50 MG tablet Take 1 tablet (50 mg total) by mouth every morning.  . lovastatin (MEVACOR) 40 MG tablet Take 1 tablet (40 mg total) by mouth every morning.  . metoprolol (LOPRESSOR) 50 MG tablet Take 0.5 tablets  (25 mg total) by mouth 2 (two) times daily. 1/2 TAB BID  . Multiple Vitamin (MULTIVITAMIN) tablet Take 1 tablet by mouth every morning.   [name] NITROSTAT 0.4 MG SL tablet PLACE 1 TABLET (0.4 MG TOTAL) UNDER THE TONGUE EVERY 5 (FIVE) MINUTESAS NEEDED FOR CHEST PAIN.  [name] Omega-3 Fatty Acids (FISH OIL) 1000 MG CAPS Take 2 capsules by mouth every morning.   . Probiotic Product (ALIGN) 4 MG CAPS Take by mouth.    Review of Systems  Constitutional: Negative.   HENT: Positive for postnasal drip ("stuffy nose").   Eyes: Negative.   Respiratory: Negative.   Cardiovascular: Negative.   Gastrointestinal: Negative.   Endocrine: Negative.   Genitourinary: Negative.   Musculoskeletal: Negative.   Skin: Negative.        Left elbow pain -from dermatologist - skin removal yesterday  Allergic/Immunologic: Negative.   Neurological: Negative.   Hematological: Negative.   Psychiatric/Behavioral: Negative.        Objective:   Physical Exam  Constitutional: He is oriented to person, place, and time. He appears well-developed and well-nourished.  The patient was alert and somewhat anxious following removal of this squamous cell carcinoma from his left elbow.  HENT:  Head: Normocephalic and atraumatic.  Right Ear: External ear normal.  Left Ear: External ear normal.  Mouth/Throat: Oropharynx is clear and moist. No oropharyngeal exudate.  There is some nasal congestion bilaterally  Eyes: Conjunctivae and EOM are normal. Pupils are equal, round, and reactive  to light. Right eye exhibits no discharge. Left eye exhibits no discharge. No scleral icterus.  Neck: Normal range of motion. Neck supple. No thyromegaly present.  The patient has a left supraclavicular bruit and a prominent right carotid and right supraclavicular bruit. The pulses in his lower extremity on the right side were difficult to palpate.  Cardiovascular: Normal rate, regular rhythm and normal heart sounds.   No murmur heard. The patient had a  regular rate and rhythm at 60/m. The radial pulses were palpable bilaterally. The lower extremity pulses on the right foot were difficult to palpate.  Pulmonary/Chest: Effort normal and breath sounds normal. No respiratory distress. He has no wheezes. He has no rales. He exhibits no tenderness.  There is no axillary adenopathy  Abdominal: Soft. Bowel sounds are normal. He exhibits no mass. There is no tenderness. There is no rebound and no guarding.  There is no inguinal adenopathy. There were no abdominal bruits and no organ enlargement noted on the abdominal exam  Genitourinary: Rectum normal and penis normal.  The prostate was slightly enlarged and there were no rectal masses. There were no inguinal hernias palpated. The external genitalia were within normal limits.  Musculoskeletal: Normal range of motion. He exhibits edema and tenderness.  Some limited ability extending the left forearm due to the recent squamous cell cancer excision. This wound was redressed.  Lymphadenopathy:    He has no cervical adenopathy.  Neurological: He is alert and oriented to person, place, and time. He has normal reflexes. No cranial nerve deficit.  Skin: Skin is warm and dry. No rash noted. No erythema. No pallor.  Left arm wound secondary to cancer removal  Psychiatric: He has a normal mood and affect. His behavior is normal. Judgment and thought content normal.  Nursing note and vitals reviewed.  BP 123/58 mmHg  Pulse 51  Temp(Src) 97.1 F (36.2 C) (Oral)  Ht 6\' 2"  (1.88 m)  Wt 163 lb (73.936 kg)  BMI 20.92 kg/m2  [facility] reading (PRIMARY) by  DrMoore-chest x-ray, diaphragms and atherosclerotic thoracic aorta                                                                       Assessment & Plan:  1. HYPERCHOLESTEROLEMIA  IIA - DG Chest 2 View; Future  2. Atherosclerosis of coronary artery bypass graft of native heart without angina pectoris - DG Chest 2 View; Future  3. Hyperplasia of  prostate  4. Squamous cell carcinoma  5. Bilateral carotid bruits  6. Peripheral vascular insufficiency  7. BPH (benign prostatic hyperplasia)  8. Other seasonal allergic rhinitis - fluticasone (FLONASE) 50 MCG/ACT nasal spray; Place 2 sprays into both nostrils daily.  Dispense: 16 g; Refill: 6  9. Thoracic aorta atherosclerosis  10. 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   Meds ordered this encounter  Medications  . ALPRAZolam (XANAX) 0.25 MG tablet    Sig: TAKE ONE TABLET BY MOUTH AT BEDTIME AS NEEDED    Dispense:  30 tablet    Refill:  3  . fluticasone (FLONASE) 50 MCG/ACT nasal spray    Sig: Place 2 sprays into both nostrils daily.    Dispense:  16 g    Refill:  6  Patient Instructions                       Medicare Annual Wellness Visit  [facility] and the medical providers at [facility] strive to bring you the best medical care.  In doing so we not only want to address your current medical conditions and concerns but also to detect new conditions early and prevent illness, disease and health-related problems.    Medicare offers a yearly Wellness Visit which allows our clinical staff to assess your need for preventative services including immunizations, lifestyle education, counseling to decrease risk of preventable diseases and screening for fall risk and other medical concerns.    This visit is provided free of charge (no copay) for all Medicare recipients. The clinical pharmacists at [facility] have begun to conduct these Wellness Visits which will also include a thorough review of all your medications.    As you primary medical provider recommend that you make an appointment for your Annual Wellness Visit if you have not done so already this year.  You may set up this appointment before you leave today or you may call back ([phone]) and schedule an appointment.  Please make sure when you call that you mention  that you are scheduling your Annual Wellness Visit with the clinical pharmacist so that the appointment may be made for the proper length of time.     Continue current medications. Continue good therapeutic lifestyle changes which include good diet and exercise. Fall precautions discussed with patient. If an FOBT was given today- please return it to our front desk. If you are over 27 years old - you may need Prevnar 70 or the adult Pneumonia vaccine.  Flu Shots will be available at our office starting mid- September. Please call and schedule a FLU CLINIC APPOINTMENT.   Change wound dressings daily as directed by dermatologist  You can use a Betadine solution through the weekend and after that just use plain saline for which we will call a prescription in. Use steroid nose spray 1-2 sprays each nostril at bedtime, discontinue the otherthat you have been using. The patient needs follow-up with the cardiologist and may need repeat Dopplers--- he will be instructed to call the cardiologist and get an appointment and find out when he needs to get these repeat Dopplers done. Follow-up with dermatologist in one month      Arrie Senate MD

## 2014-01-14 NOTE — Addendum Note (Signed)
Addended by: Zannie Cove on: 01/14/2014 12:21 PM   Modules accepted: Orders

## 2014-01-16 ENCOUNTER — Telehealth: Payer: Self-pay

## 2014-01-16 NOTE — Telephone Encounter (Signed)
-----   Message from Chipper Herb, MD sent at 01/14/2014  4:23 PM EST ----- As per radiology report------ please send a copy of this report to Dr. Allyn Kenner ,dermatology. Also send a copy to Dr. Johnsie Cancel, cardiology

## 2014-01-16 NOTE — Telephone Encounter (Signed)
Pt aware of results 

## 2014-01-27 ENCOUNTER — Other Ambulatory Visit: Payer: Commercial Managed Care - HMO

## 2014-01-27 DIAGNOSIS — Z1212 Encounter for screening for malignant neoplasm of rectum: Secondary | ICD-10-CM | POA: Diagnosis not present

## 2014-01-27 NOTE — Progress Notes (Signed)
Lab only specimen drop off 

## 2014-01-29 LAB — FECAL OCCULT BLOOD, IMMUNOCHEMICAL: Fecal Occult Bld: POSITIVE — AB

## 2014-01-30 NOTE — Addendum Note (Signed)
Addended by: Shelbie Ammons on: 01/30/2014 11:45 AM   Modules accepted: Orders

## 2014-02-03 ENCOUNTER — Telehealth: Payer: Self-pay | Admitting: Family Medicine

## 2014-02-06 ENCOUNTER — Telehealth: Payer: Self-pay

## 2014-02-06 NOTE — Telephone Encounter (Signed)
Patient received a letter from DS to be set up for his colonoscopy. Please call him at [phone]

## 2014-02-06 NOTE — Telephone Encounter (Signed)
I called pt and he has been having some abdominal pain and hurts in his rectum when he has a BM, although he said he is not constipated.   He did an iFOBT at [facility] and he said he was told that it was positive.  I scheduled him with Walden Field, NP on 02/27/2014 at 8:30 AM. ( He said his wife comes here and likes Dr. Oneida Alar)  After I got him scheduled, I saw that he had been seen at Dr. Olevia Perches office in 2014 by the PA.  I called him back and he said the only reason he went there was because he could not see anyone else at the time, he really would like to be with the doctor his wife sees.  Per my office manager, Sofie Rower,  I am routing this to Dr. Oneida Alar to see if she will accept him as a pt.

## 2014-02-07 NOTE — Telephone Encounter (Signed)
Pt is aware and aware of his OV on 02/27/2014 with Walden Field, NP.

## 2014-02-07 NOTE — Telephone Encounter (Signed)
PLEASE CALL PT. HE MAY SEE ME AS A PT.

## 2014-02-10 NOTE — Telephone Encounter (Signed)
Made SLF Mr. Sinning primary GI doctor

## 2014-02-11 ENCOUNTER — Other Ambulatory Visit (INDEPENDENT_AMBULATORY_CARE_PROVIDER_SITE_OTHER): Payer: Commercial Managed Care - HMO

## 2014-02-11 DIAGNOSIS — I25810 Atherosclerosis of coronary artery bypass graft(s) without angina pectoris: Secondary | ICD-10-CM | POA: Diagnosis not present

## 2014-02-11 DIAGNOSIS — D75839 Thrombocytosis, unspecified: Secondary | ICD-10-CM

## 2014-02-11 DIAGNOSIS — E7800 Pure hypercholesterolemia, unspecified: Secondary | ICD-10-CM

## 2014-02-11 DIAGNOSIS — E559 Vitamin D deficiency, unspecified: Secondary | ICD-10-CM | POA: Diagnosis not present

## 2014-02-11 DIAGNOSIS — N400 Enlarged prostate without lower urinary tract symptoms: Secondary | ICD-10-CM | POA: Diagnosis not present

## 2014-02-11 DIAGNOSIS — D473 Essential (hemorrhagic) thrombocythemia: Secondary | ICD-10-CM

## 2014-02-11 DIAGNOSIS — I10 Essential (primary) hypertension: Secondary | ICD-10-CM

## 2014-02-11 LAB — POCT CBC
Granulocyte percent: 65.1 %G (ref 37–80)
HCT, POC: 44 % (ref 43.5–53.7)
HEMOGLOBIN: 14.1 g/dL (ref 14.1–18.1)
LYMPH, POC: 1.9 (ref 0.6–3.4)
MCH, POC: 30.3 pg (ref 27–31.2)
MCHC: 32.1 g/dL (ref 31.8–35.4)
MCV: 94.5 fL (ref 80–97)
MPV: 8.1 fL (ref 0–99.8)
PLATELET COUNT, POC: 276 10*3/uL (ref 142–424)
POC Granulocyte: 3.9 (ref 2–6.9)
POC LYMPH PERCENT: 31.1 %L (ref 10–50)
RBC: 4.7 M/uL (ref 4.69–6.13)
RDW, POC: 14.1 %
WBC: 6 10*3/uL (ref 4.6–10.2)

## 2014-02-11 NOTE — Progress Notes (Signed)
LAB ONLY 

## 2014-02-12 LAB — HEPATIC FUNCTION PANEL
ALK PHOS: 60 IU/L (ref 39–117)
ALT: 15 IU/L (ref 0–44)
AST: 19 IU/L (ref 0–40)
Albumin: 4.2 g/dL (ref 3.5–4.7)
Bilirubin, Direct: 0.09 mg/dL (ref 0.00–0.40)
TOTAL PROTEIN: 6.1 g/dL (ref 6.0–8.5)
Total Bilirubin: 0.2 mg/dL (ref 0.0–1.2)

## 2014-02-12 LAB — VITAMIN D 25 HYDROXY (VIT D DEFICIENCY, FRACTURES): VIT D 25 HYDROXY: 52.7 ng/mL (ref 30.0–100.0)

## 2014-02-12 LAB — NMR, LIPOPROFILE
Cholesterol: 175 mg/dL (ref 100–199)
HDL CHOLESTEROL BY NMR: 58 mg/dL (ref >39)
HDL Particle Number: 32.9 umol/L (ref >=30.5)
LDL PARTICLE NUMBER: 1010 nmol/L — AB (ref <1000)
LDL SIZE: 21.3 nm (ref >20.5)
LDL-C: 99 mg/dL (ref 0–99)
LP-IR Score: <25 (ref <=45)
SMALL LDL PARTICLE NUMBER: 313 nmol/L (ref <=527)
Triglycerides by NMR: 90 mg/dL (ref 0–149)

## 2014-02-12 LAB — BMP8+EGFR
BUN/Creatinine Ratio: 9 — ABNORMAL LOW (ref 10–22)
BUN: 8 mg/dL (ref 8–27)
CALCIUM: 9.1 mg/dL (ref 8.6–10.2)
CHLORIDE: 98 mmol/L (ref 97–108)
CO2: 25 mmol/L (ref 18–29)
CREATININE: 0.92 mg/dL (ref 0.76–1.27)
GFR, EST AFRICAN AMERICAN: 90 mL/min/{1.73_m2} (ref >59)
GFR, EST NON AFRICAN AMERICAN: 78 mL/min/{1.73_m2} (ref >59)
Glucose: 83 mg/dL (ref 65–99)
POTASSIUM: 4.5 mmol/L (ref 3.5–5.2)
SODIUM: 137 mmol/L (ref 134–144)

## 2014-02-17 DIAGNOSIS — Z08 Encounter for follow-up examination after completed treatment for malignant neoplasm: Secondary | ICD-10-CM | POA: Diagnosis not present

## 2014-02-17 DIAGNOSIS — X32XXXD Exposure to sunlight, subsequent encounter: Secondary | ICD-10-CM | POA: Diagnosis not present

## 2014-02-17 DIAGNOSIS — L570 Actinic keratosis: Secondary | ICD-10-CM | POA: Diagnosis not present

## 2014-02-17 DIAGNOSIS — Z85828 Personal history of other malignant neoplasm of skin: Secondary | ICD-10-CM | POA: Diagnosis not present

## 2014-02-27 ENCOUNTER — Ambulatory Visit (INDEPENDENT_AMBULATORY_CARE_PROVIDER_SITE_OTHER): Payer: Medicare HMO | Admitting: Nurse Practitioner

## 2014-02-27 ENCOUNTER — Other Ambulatory Visit: Payer: Self-pay

## 2014-02-27 ENCOUNTER — Encounter: Payer: Self-pay | Admitting: Nurse Practitioner

## 2014-02-27 VITALS — BP 117/64 | HR 53 | Temp 97.0°F | Ht 74.0 in | Wt 164.6 lb

## 2014-02-27 DIAGNOSIS — R195 Other fecal abnormalities: Secondary | ICD-10-CM | POA: Diagnosis not present

## 2014-02-27 DIAGNOSIS — R1310 Dysphagia, unspecified: Secondary | ICD-10-CM

## 2014-02-27 DIAGNOSIS — R1314 Dysphagia, pharyngoesophageal phase: Secondary | ICD-10-CM

## 2014-02-27 DIAGNOSIS — K5900 Constipation, unspecified: Secondary | ICD-10-CM | POA: Insufficient documentation

## 2014-02-27 MED ORDER — PEG 3350-KCL-NA BICARB-NACL 420 G PO SOLR: 4000.0000 mL | ORAL | Status: DC

## 2014-02-27 NOTE — Assessment & Plan Note (Signed)
1-2 times a week solid food dysphagia and pill dysphagia. Sensation of food getting stuck and then either can feel it push through or will have regurgitation of food. Possible age-related dysmotility but cannot rule out stricture, web, or ring. Will add on EGD with possible dilation to his needed colonoscopy for further evaluation.  Proceed with colonoscopy with EGD +- dilation with Dr. Oneida Alar in the near future. The risks, benefits, and alternatives have been discussed in detail with the patient. They state understanding and desire to proceed.

## 2014-02-27 NOTE — Assessment & Plan Note (Signed)
Heme + stool on iFOBT per PCP. No overt hematochezia or melena. Last colonoscopy 10 years ago with inflammed hyperplastic 8 mm sessile polyp x 1 in the transverse colon. Also with associated abdominal pain which is likely related to constipation. Blood possibly benign anorectal source given concurrent symptoms of constipation, rectal pain, constipation with straining; but cannot rule out more occult process. Will proceed with TCS and EGD  Proceed with colonoscopy with EGD +- dilation with Dr. Oneida Alar in the near future. The risks, benefits, and alternatives have been discussed in detail with the patient. They state understanding and desire to proceed.

## 2014-02-27 NOTE — Assessment & Plan Note (Signed)
Constipation with associated rectal and abdominal pain. Apparent adequate water intake, inadequate fiber intake. Will try Miralax 3 times a week titrate up to daily for improved stool softening and bowel regularity. Has already tried prune juice which tends to cause diarrhea. Will also evaluate for other occult causes at colonoscopy.

## 2014-02-27 NOTE — Patient Instructions (Signed)
1. We will schedule your procedure (colonoscopy and endoscopy with possible dilation) 2. Try Miralax 3 times a week to promote bowel regularity and decrease constipation; can increase to once a day if no improvement. If still no improvement let us know and we can try a different approach 3. Increase the fiber in your diet.with more fruits and vegetables. 4. Further recommendations to be based on the results of your procedure.

## 2014-02-27 NOTE — Progress Notes (Signed)
Primary Care Physician:  Redge Gainer, MD Primary Gastroenterologist:  Dr. Oneida Alar  Chief Complaint  Patient presents with  . Constipation  . Abdominal Pain  . Rectal Pain  . set up TCS    HPI:   81 year old male presents for evaluation for colonoscopy. Last colonoscopy in 2005 with Dr. Amedeo Plenty where an 8 mm sessile transverse colon polypectomy has been removed and pathology demonstrated inflamed hyperplastic polyp with no adenomatous epithelium or malignancy. Seen by PCP who did iFOBT which was positive and recommended repeat colonoscopy. Has also c/o abdominal pain and rectal pain with bowel movement with constipation.  Abdominal pain started about 6 months ago, intermittent, described as sharp and lasts "just about 5 seconds." Thought it was gas and constipation. Will try prune juice for the constipation which results in diarrhea. Has been constipated about the same time as the abdominal pain. Has not tried Miralax. Drinks about 4 full glasses a day and a 12 ounce gatorade a day. Eats minimal fruits/veggies a day. No noted hematochezia and melena. Also having rectal pain when constipated and straining. Has constipation issues if no prune juice taken when he will have a bowel movement every 2-3 days. When taking prune juice will have a bowel movement daily consistent with diarrhea. Rare GERD symptoms which is relieved by a dose of TUMS. Admits solid food dysphagia about 1-2 times a week which is associated with food regurgitation. Occasional pill dysphagia with larger medications. Denies any other upper or lower GI symptoms. Denies Denies fever/chills, unintentional weight loss, N/V, chest pain, dyspnea.  Past Medical History  Diagnosis Date  . Anxiety   . Hyperlipidemia   . Hypertension   . Vitamin D deficiency   . C. difficile colitis     Past Surgical History  Procedure Laterality Date  . Coronary artery bypass graft    . Carotid endarterectomy Left   . Carotid-subclavian bypass  graft Right     Current Outpatient Prescriptions  Medication Sig Dispense Refill  . ALPRAZolam (XANAX) 0.25 MG tablet TAKE ONE TABLET BY MOUTH AT BEDTIME AS NEEDED 30 tablet 3  . aspirin 325 MG EC tablet Take 325 mg by mouth every morning.     . Cholecalciferol (VITAMIN D-3 PO) Take 2,000 mg by mouth every morning.     . fluticasone (FLONASE) 50 MCG/ACT nasal spray Place 2 sprays into both nostrils daily. 16 g 6  . losartan (COZAAR) 50 MG tablet Take 1 tablet (50 mg total) by mouth every morning. 90 tablet 3  . lovastatin (MEVACOR) 40 MG tablet Take 1 tablet (40 mg total) by mouth every morning. 90 tablet 3  . metoprolol (LOPRESSOR) 50 MG tablet Take 0.5 tablets (25 mg total) by mouth 2 (two) times daily. 1/2 TAB BID 90 tablet 3  . Multiple Vitamin (MULTIVITAMIN) tablet Take 1 tablet by mouth every morning.     [name] NITROSTAT 0.4 MG SL tablet PLACE 1 TABLET (0.4 MG TOTAL) UNDER THE TONGUE EVERY 5 (FIVE) MINUTESAS NEEDED FOR CHEST PAIN. 25 tablet 2  . Omega-3 Fatty Acids (FISH OIL) 1000 MG CAPS Take 2 capsules by mouth every morning.     . Probiotic Product (ALIGN) 4 MG CAPS Take by mouth.     No current facility-administered medications for this visit.    Allergies as of 02/27/2014 - Review Complete 02/27/2014  Allergen Reaction Noted  . Doxycycline Diarrhea 09/17/2012  . Contrast media [iodinated diagnostic agents]  05/30/2012  . Eszopiclone  05/18/2010  .  Lunesta [eszopiclone]  05/30/2012  . Morphine and related Nausea And Vomiting 02/27/2014    Family History  Problem Relation Age of Onset  . Heart disease Mother   . Stroke Mother   . Stroke Father   . Diabetes Son     History   Social History  . Marital Status: Married    Spouse Name: N/A    Number of Children: N/A  . Years of Education: N/A   Occupational History  . Not on file.   Social History Main Topics  . Smoking status: Former Smoker    Types: Cigarettes    Start date: 01/24/1950    Quit date: 01/24/1961    . Smokeless tobacco: Never Used  . Alcohol Use: Yes     Comment: not drank since 12/11  . Drug Use: No  . Sexual Activity:    Partners: Female   Other Topics Concern  . Not on file   Social History Narrative    Review of Systems: All systems negative except as noted in HPI.  Physical Exam: BP 117/64 mmHg  Pulse 53  Temp(Src) 97 F (36.1 C)  Ht 6\' 2"  (1.88 m)  Wt 164 lb 9.6 oz (74.662 kg)  BMI 21.12 kg/m2 General:   Alert and oriented. Pleasant and cooperative. Well-nourished and well-developed.  Head:  Normocephalic and atraumatic. Eyes:  Without icterus, sclera clear and conjunctiva pink.  Ears:  Normal auditory acuity. Mouth:  No deformity or lesions, oral mucosa pink. No OP edema. Neck:  Supple, without mass or thyromegaly. Lungs:  Clear to auscultation bilaterally. No wheezes, rales, or rhonchi. No distress.  Heart:  S1, S2 present without murmurs appreciated.  Abdomen:  +BS, soft, non-tender and non-distended. No HSM noted. No guarding or rebound. No masses appreciated.  Rectal:  Deferred  Msk:  Symmetrical without gross deformities. Normal posture. Pulses:  Normal pulses noted. Extremities:  Without clubbing or edema. Neurologic:  Alert and  oriented x4;  grossly normal neurologically. Skin:  Intact without significant lesions or rashes. Cervical Nodes:  No significant cervical adenopathy. Psych:  Alert and cooperative. Normal mood and affect.     02/27/2014 8:29 AM

## 2014-02-27 NOTE — Progress Notes (Signed)
PA # for TCS&EGD is [id number]

## 2014-02-28 NOTE — Progress Notes (Signed)
cc'ed to pcp °

## 2014-03-07 NOTE — Progress Notes (Signed)
Pt wants to reschedule EGD/ED/TCS for 03/10/2014 due to possible snow and ice.   Office closed  Pt to call back Monday.

## 2014-03-14 DIAGNOSIS — H3531 Nonexudative age-related macular degeneration: Secondary | ICD-10-CM | POA: Diagnosis not present

## 2014-03-20 ENCOUNTER — Other Ambulatory Visit: Payer: Self-pay | Admitting: Cardiovascular Disease

## 2014-03-27 ENCOUNTER — Telehealth: Payer: Self-pay | Admitting: Cardiovascular Disease

## 2014-03-27 NOTE — Telephone Encounter (Signed)
Pt calling to schedule his carotid duplex bilateral per recall letter received in the mail.  Informed the pt that I will send our [facility] to contact the pt to have this scheduled.  Pt verbalized understanding and agrees with this plan.

## 2014-03-27 NOTE — Telephone Encounter (Signed)
New Message  Pt calling about Carotid test. Order is from 2015; do not have authorization. Per pt- he got a letter to sched test. Please call back and dsicuss.

## 2014-03-31 ENCOUNTER — Telehealth: Payer: Self-pay

## 2014-03-31 NOTE — Telephone Encounter (Signed)
He is having some pain in his stomach and he is also having diarrhea. He would like to know what he can do about the diarrhea. He uses the [facility] in [place]. His call back number is [phone].

## 2014-04-03 ENCOUNTER — Other Ambulatory Visit (HOSPITAL_COMMUNITY): Payer: Self-pay | Admitting: Cardiology

## 2014-04-03 DIAGNOSIS — I6523 Occlusion and stenosis of bilateral carotid arteries: Secondary | ICD-10-CM

## 2014-04-04 NOTE — Telephone Encounter (Signed)
Have him hold the Miralax while havign diarrhea. Can try OTC Immodium. If he has any fevers or signs of infection or if symptoms are persistent he should be evaluated by someone.[name]

## 2014-04-07 ENCOUNTER — Ambulatory Visit (HOSPITAL_COMMUNITY): Payer: Commercial Managed Care - HMO | Attending: Cardiology | Admitting: Cardiology

## 2014-04-07 DIAGNOSIS — I6523 Occlusion and stenosis of bilateral carotid arteries: Secondary | ICD-10-CM | POA: Insufficient documentation

## 2014-04-07 DIAGNOSIS — G458 Other transient cerebral ischemic attacks and related syndromes: Secondary | ICD-10-CM

## 2014-04-07 NOTE — Progress Notes (Signed)
Carotid duplex performed 

## 2014-04-07 NOTE — Telephone Encounter (Signed)
TRIED TO CALL WITH NO ANSWER

## 2014-04-08 NOTE — Telephone Encounter (Signed)
Pt is aware and doing fine now

## 2014-04-11 ENCOUNTER — Encounter (HOSPITAL_COMMUNITY): Payer: Self-pay | Admitting: *Deleted

## 2014-04-11 ENCOUNTER — Encounter (HOSPITAL_COMMUNITY)
Admission: RE | Disposition: A | Discharge status: Home / Self Care | Payer: Self-pay | Source: Ambulatory Visit | Attending: Gastroenterology

## 2014-04-11 ENCOUNTER — Ambulatory Visit (HOSPITAL_COMMUNITY)
Admission: RE | Admit: 2014-04-11 | Discharge: 2014-04-11 | Disposition: A | Discharge status: Home / Self Care | Payer: Commercial Managed Care - HMO | Source: Ambulatory Visit | Attending: Gastroenterology | Admitting: Gastroenterology

## 2014-04-11 DIAGNOSIS — Z886 Allergy status to analgesic agent status: Secondary | ICD-10-CM | POA: Diagnosis not present

## 2014-04-11 DIAGNOSIS — I10 Essential (primary) hypertension: Secondary | ICD-10-CM | POA: Diagnosis not present

## 2014-04-11 DIAGNOSIS — Z86010 Personal history of colonic polyps: Secondary | ICD-10-CM | POA: Insufficient documentation

## 2014-04-11 DIAGNOSIS — E785 Hyperlipidemia, unspecified: Secondary | ICD-10-CM | POA: Insufficient documentation

## 2014-04-11 DIAGNOSIS — K6389 Other specified diseases of intestine: Secondary | ICD-10-CM | POA: Insufficient documentation

## 2014-04-11 DIAGNOSIS — Q438 Other specified congenital malformations of intestine: Secondary | ICD-10-CM | POA: Diagnosis not present

## 2014-04-11 DIAGNOSIS — R1314 Dysphagia, pharyngoesophageal phase: Secondary | ICD-10-CM | POA: Insufficient documentation

## 2014-04-11 DIAGNOSIS — Z87891 Personal history of nicotine dependence: Secondary | ICD-10-CM | POA: Insufficient documentation

## 2014-04-11 DIAGNOSIS — Z951 Presence of aortocoronary bypass graft: Secondary | ICD-10-CM | POA: Diagnosis not present

## 2014-04-11 DIAGNOSIS — K222 Esophageal obstruction: Secondary | ICD-10-CM | POA: Insufficient documentation

## 2014-04-11 DIAGNOSIS — I2510 Atherosclerotic heart disease of native coronary artery without angina pectoris: Secondary | ICD-10-CM | POA: Diagnosis not present

## 2014-04-11 DIAGNOSIS — K644 Residual hemorrhoidal skin tags: Secondary | ICD-10-CM | POA: Diagnosis not present

## 2014-04-11 DIAGNOSIS — K5730 Diverticulosis of large intestine without perforation or abscess without bleeding: Secondary | ICD-10-CM | POA: Insufficient documentation

## 2014-04-11 DIAGNOSIS — R1310 Dysphagia, unspecified: Secondary | ICD-10-CM | POA: Diagnosis present

## 2014-04-11 DIAGNOSIS — K2950 Unspecified chronic gastritis without bleeding: Secondary | ICD-10-CM | POA: Insufficient documentation

## 2014-04-11 DIAGNOSIS — Z888 Allergy status to other drugs, medicaments and biological substances status: Secondary | ICD-10-CM | POA: Diagnosis not present

## 2014-04-11 DIAGNOSIS — Z7982 Long term (current) use of aspirin: Secondary | ICD-10-CM | POA: Diagnosis not present

## 2014-04-11 DIAGNOSIS — Z85828 Personal history of other malignant neoplasm of skin: Secondary | ICD-10-CM | POA: Insufficient documentation

## 2014-04-11 DIAGNOSIS — R195 Other fecal abnormalities: Secondary | ICD-10-CM | POA: Insufficient documentation

## 2014-04-11 DIAGNOSIS — F419 Anxiety disorder, unspecified: Secondary | ICD-10-CM | POA: Diagnosis not present

## 2014-04-11 DIAGNOSIS — E559 Vitamin D deficiency, unspecified: Secondary | ICD-10-CM | POA: Insufficient documentation

## 2014-04-11 DIAGNOSIS — K921 Melena: Secondary | ICD-10-CM | POA: Diagnosis present

## 2014-04-11 HISTORY — PX: ESOPHAGOGASTRODUODENOSCOPY: SHX5428

## 2014-04-11 HISTORY — PX: ESOPHAGEAL DILATION: SHX303

## 2014-04-11 HISTORY — PX: COLONOSCOPY: SHX5424

## 2014-04-11 SURGERY — COLONOSCOPY
Anesthesia: Moderate Sedation

## 2014-04-11 MED ORDER — STERILE WATER FOR IRRIGATION IR SOLN
Status: DC | PRN
  Administered 2014-04-11: 10:00:00

## 2014-04-11 MED ORDER — LIDOCAINE VISCOUS 2 % MT SOLN
OROMUCOSAL | Status: DC | PRN
  Administered 2014-04-11: 3 mL via OROMUCOSAL

## 2014-04-11 MED ORDER — LIDOCAINE VISCOUS 2 % MT SOLN
OROMUCOSAL | Status: AC
  Filled 2014-04-11: qty 15

## 2014-04-11 MED ORDER — SODIUM CHLORIDE 0.9 % IV SOLN
INTRAVENOUS | Status: DC
  Administered 2014-04-11: 1000 mL via INTRAVENOUS

## 2014-04-11 MED ORDER — MEPERIDINE HCL 100 MG/ML IJ SOLN
INTRAMUSCULAR | Status: DC | PRN
  Administered 2014-04-11 (×2): 25 mg via INTRAVENOUS

## 2014-04-11 MED ORDER — OMEPRAZOLE 20 MG PO CPDR: DELAYED_RELEASE_CAPSULE | ORAL | Status: DC

## 2014-04-11 MED ORDER — MIDAZOLAM HCL 5 MG/5ML IJ SOLN
INTRAMUSCULAR | Status: AC
  Filled 2014-04-11: qty 10

## 2014-04-11 MED ORDER — MIDAZOLAM HCL 5 MG/5ML IJ SOLN
INTRAMUSCULAR | Status: DC | PRN
  Administered 2014-04-11 (×2): 1 mg via INTRAVENOUS
  Administered 2014-04-11: 2 mg via INTRAVENOUS
  Administered 2014-04-11: 1 mg via INTRAVENOUS

## 2014-04-11 MED ORDER — MINERAL OIL PO OIL
TOPICAL_OIL | ORAL | Status: AC
  Filled 2014-04-11: qty 30

## 2014-04-11 MED ORDER — ATROPINE SULFATE 1 MG/ML IJ SOLN
INTRAMUSCULAR | Status: AC
  Filled 2014-04-11: qty 1

## 2014-04-11 MED ORDER — MEPERIDINE HCL 100 MG/ML IJ SOLN
INTRAMUSCULAR | Status: AC
  Filled 2014-04-11: qty 2

## 2014-04-11 MED ORDER — ATROPINE SULFATE 1 MG/ML IJ SOLN
INTRAMUSCULAR | Status: DC | PRN
  Administered 2014-04-11 (×2): .5 mg via INTRAVENOUS

## 2014-04-11 NOTE — Progress Notes (Signed)
REVIEWED-NO ADDITIONAL RECOMMENDATIONS. 

## 2014-04-11 NOTE — Discharge Instructions (Signed)
YOU DID NOT HAVE ANY POLYPS. NO SOURCE FOR YOUR RECTAL BLEEDING WAS IDENTIFIED. YOU HAVE DIVERTICULOSIS IN YOUR LEFT COLON.  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  YOU HAVE GASTRITIS MOST LIKELY DUE TO ASPIRIN USE. I BIOPSIED YOUR STOMACH.    START OMEPRAZOLE.  TAKE 30 MINUTES PRIOR TO YOUR MEALS TWICE DAILY FOR 3 MOS THEN ONCE DAILY FOREVER.  FOLLOW A LOW FAT DIET. AVOID ITEMS THAT CAUSE BLOATING. SEE INFO BELOW.  YOUR BIOPSY RESULTS WILL BE AVAILABLE IN MY CHART AFTER MAR 22  OR MY OFFICE WILL CONTACT YOU IN 10-14 DAYS WITH YOUR RESULTS.   Follow up in JUN 2016.      ENDOSCOPY Care After Read the instructions outlined below and refer to this sheet in the next week. These discharge instructions provide you with general information on caring for yourself after you leave the hospital. While your treatment has been planned according to the most current medical practices available, unavoidable complications occasionally occur. If you have any problems or questions after discharge, call DR. Caison Hearn, [phone].  ACTIVITY  You may resume your regular activity, but move at a slower pace for the next 24 hours.   Take frequent rest periods for the next 24 hours.   Walking will help get rid of the air and reduce the bloated feeling in your belly (abdomen).   No driving for 24 hours (because of the medicine (anesthesia) used during the test).   You may shower.   Do not sign any important legal documents or operate any machinery for 24 hours (because of the anesthesia used during the test).    NUTRITION  Drink plenty of fluids.   You may resume your normal diet as instructed by your doctor.   Begin with a light meal and progress to your normal diet. Heavy or fried foods are harder to digest and may make you feel sick to your stomach (nauseated).   Avoid alcoholic beverages for 24 hours or as instructed.    MEDICATIONS  You may resume your normal  medications.   WHAT YOU CAN EXPECT TODAY  Some feelings of bloating in the abdomen.   Passage of more gas than usual.   Spotting of blood in your stool or on the toilet paper  .  IF YOU HAD POLYPS REMOVED DURING THE ENDOSCOPY:  Eat a soft diet IF YOU HAVE NAUSEA, BLOATING, ABDOMINAL PAIN, OR VOMITING.    FINDING OUT THE RESULTS OF YOUR TEST Not all test results are available during your visit. DR. Oneida Alar WILL CALL YOU WITHIN 14 DAYS OF YOUR PROCEDUE WITH YOUR RESULTS. Do not assume everything is normal if you have not heard from DR. Sherine Cortese, CALL HER OFFICE AT [phone].  SEEK IMMEDIATE MEDICAL ATTENTION AND CALL THE OFFICE: [phone] IF:  You have more than a spotting of blood in your stool.   Your belly is swollen (abdominal distention).   You are nauseated or vomiting.   You have a temperature over 101F.   You have abdominal pain or discomfort that is severe or gets worse throughout the day.   Low-Fat Diet BREADS, CEREALS, PASTA, RICE, DRIED PEAS, AND BEANS These products are high in carbohydrates and most are low in fat. Therefore, they can be increased in the diet as substitutes for fatty foods. They too, however, contain calories and should not be eaten in excess. Cereals can be eaten for snacks as well as for breakfast.   FRUITS AND VEGETABLES It is good to eat  fruits and vegetables. Besides being sources of fiber, both are rich in vitamins and some minerals. They help you get the daily allowances of these nutrients. Fruits and vegetables can be used for snacks and desserts.  MEATS Limit lean meat, chicken, [place], and fish to no more than 6 ounces per day. Beef, Pork, and Lamb Use lean cuts of beef, pork, and lamb. Lean cuts include:  Extra-lean ground beef.  Arm roast.  Sirloin tip.  Center-cut ham.  Round steak.  Loin chops.  Rump roast.  Tenderloin.  Trim all fat off the outside of meats before cooking. It is not necessary to severely decrease the  intake of red meat, but lean choices should be made. Lean meat is rich in protein and contains a highly absorbable form of iron. Premenopausal women, in particular, should avoid reducing lean red meat because this could increase the risk for low red blood cells (iron-deficiency anemia).  Chicken and [place] These are good sources of protein. The fat of poultry can be reduced by removing the skin and underlying fat layers before cooking. Chicken and [place] can be substituted for lean red meat in the diet. Poultry should not be fried or covered with high-fat sauces. Fish and Shellfish Fish is a good source of protein. Shellfish contain cholesterol, but they usually are low in saturated fatty acids. The preparation of fish is important. Like chicken and [place], they should not be fried or covered with high-fat sauces. EGGS Egg whites contain no fat or cholesterol. They can be eaten often. Try 1 to 2 egg whites instead of whole eggs in recipes or use egg substitutes that do not contain yolk. MILK AND DAIRY PRODUCTS Use skim or 1% milk instead of 2% or whole milk. Decrease whole milk, natural, and processed cheeses. Use nonfat or low-fat (2%) cottage cheese or low-fat cheeses made from vegetable oils. Choose nonfat or low-fat (1 to 2%) yogurt. Experiment with evaporated skim milk in recipes that call for heavy cream. Substitute low-fat yogurt or low-fat cottage cheese for sour cream in dips and salad dressings. Have at least 2 servings of low-fat dairy products, such as 2 glasses of skim (or 1%) milk each day to help get your daily calcium intake. FATS AND OILS Reduce the total intake of fats, especially saturated fat. Butterfat, lard, and beef fats are high in saturated fat and cholesterol. These should be avoided as much as possible. Vegetable fats do not contain cholesterol, but certain vegetable fats, such as coconut oil, palm oil, and palm kernel oil are very high in saturated fats. These should be  limited. These fats are often used in bakery goods, processed foods, popcorn, oils, and nondairy creamers. Vegetable shortenings and some peanut butters contain hydrogenated oils, which are also saturated fats. Read the labels on these foods and check for saturated vegetable oils. Unsaturated vegetable oils and fats do not raise blood cholesterol. However, they should be limited because they are fats and are high in calories. Total fat should still be limited to 30% of your daily caloric intake. Desirable liquid vegetable oils are corn oil, cottonseed oil, olive oil, canola oil, safflower oil, soybean oil, and sunflower oil. Peanut oil is not as good, but small amounts are acceptable. Buy a heart-healthy tub margarine that has no partially hydrogenated oils in the ingredients. Mayonnaise and salad dressings often are made from unsaturated fats, but they should also be limited because of their high calorie and fat content. Seeds, nuts, peanut butter, olives, and avocados  are high in fat, but the fat is mainly the unsaturated type. These foods should be limited mainly to avoid excess calories and fat. OTHER EATING TIPS Snacks  Most sweets should be limited as snacks. They tend to be rich in calories and fats, and their caloric content outweighs their nutritional value. Some good choices in snacks are graham crackers, melba toast, soda crackers, bagels (no egg), English muffins, fruits, and vegetables. These snacks are preferable to snack crackers, [place] fries, TORTILLA CHIPS, and POTATO chips. Popcorn should be air-popped or cooked in small amounts of liquid vegetable oil. Desserts Eat fruit, low-fat yogurt, and fruit ices instead of pastries, cake, and cookies. Sherbet, angel food cake, gelatin dessert, frozen low-fat yogurt, or other frozen products that do not contain saturated fat (pure fruit juice bars, frozen ice pops) are also acceptable.  COOKING METHODS Choose those methods that use little or no  fat. They include: Poaching.  Braising.  Steaming.  Grilling.  Baking.  Stir-frying.  Broiling.  Microwaving.  Foods can be cooked in a nonstick pan without added fat, or use a nonfat cooking spray in regular cookware. Limit fried foods and avoid frying in saturated fat. Add moisture to lean meats by using water, broth, cooking wines, and other nonfat or low-fat sauces along with the cooking methods mentioned above. Soups and stews should be chilled after cooking. The fat that forms on top after a few hours in the refrigerator should be skimmed off. When preparing meals, avoid using excess salt. Salt can contribute to raising blood pressure in some people.  EATING AWAY FROM HOME Order entres, potatoes, and vegetables without sauces or butter. When meat exceeds the size of a deck of cards (3 to 4 ounces), the rest can be taken home for another meal. Choose vegetable or fruit salads and ask for low-calorie salad dressings to be served on the side. Use dressings sparingly. Limit high-fat toppings, such as bacon, crumbled eggs, cheese, sunflower seeds, and olives. Ask for heart-healthy tub margarine instead of butter.  High-Fiber Diet A high-fiber diet changes your normal diet to include more whole grains, legumes, fruits, and vegetables. Changes in the diet involve replacing refined carbohydrates with unrefined foods. The calorie level of the diet is essentially unchanged. The Dietary Reference Intake (recommended amount) for adult males is 38 grams per day. For adult females, it is 25 grams per day. Pregnant and lactating women should consume 28 grams of fiber per day. Fiber is the intact part of a plant that is not broken down during digestion. Functional fiber is fiber that has been isolated from the plant to provide a beneficial effect in the body. PURPOSE  Increase stool bulk.   Ease and regulate bowel movements.   Lower cholesterol.  INDICATIONS THAT YOU NEED MORE FIBER  Constipation  and hemorrhoids.   Uncomplicated diverticulosis (intestine condition) and irritable bowel syndrome.   Weight management.   As a protective measure against hardening of the arteries (atherosclerosis), diabetes, and cancer.   GUIDELINES FOR INCREASING FIBER IN THE DIET  Start adding fiber to the diet slowly. A gradual increase of about 5 more grams (2 slices of whole-wheat bread, 2 servings of most fruits or vegetables, or 1 bowl of high-fiber cereal) per day is best. Too rapid an increase in fiber may result in constipation, flatulence, and bloating.   Drink enough water and fluids to keep your urine clear or pale yellow. Water, juice, or caffeine-free drinks are recommended. Not drinking enough fluid may cause  constipation.   Eat a variety of high-fiber foods rather than one type of fiber.   Try to increase your intake of fiber through using high-fiber foods rather than fiber pills or supplements that contain small amounts of fiber.   The goal is to change the types of food eaten. Do not supplement your present diet with high-fiber foods, but replace foods in your present diet.  INCLUDE A VARIETY OF FIBER SOURCES  Replace refined and processed grains with whole grains, canned fruits with fresh fruits, and incorporate other fiber sources. White rice, white breads, and most bakery goods contain little or no fiber.   Brown whole-grain rice, buckwheat oats, and many fruits and vegetables are all good sources of fiber. These include: broccoli, Brussels sprouts, cabbage, cauliflower, beets, sweet potatoes, white potatoes (skin on), carrots, tomatoes, eggplant, squash, berries, fresh fruits, and dried fruits.   Cereals appear to be the richest source of fiber. Cereal fiber is found in whole grains and bran. Bran is the fiber-rich outer coat of cereal grain, which is largely removed in refining. In whole-grain cereals, the bran remains. In breakfast cereals, the largest amount of fiber is found in  those with "bran" in their names. The fiber content is sometimes indicated on the label.   You may need to include additional fruits and vegetables each day.   In baking, for 1 cup white flour, you may use the following substitutions:   1 cup whole-wheat flour minus 2 tablespoons.   1/2 cup white flour plus 1/2 cup whole-wheat flour.      ESOPHAGEAL STRICTURES  SYMPTOMS YOUR ESOPHAGEAL STRICTURE IS MOST LIKELY DUE TO ACID REFLUX. Common symptoms of GERD are heartburn (burning in your chest). This is worse when lying down or bending over. It may also cause belching, or difficulty swallowing, and indigestion. Some of the things which make GERD worse are:  HOME CARE INSTRUCTIONS  Try to achieve and maintain an ideal body weight.   Avoid drinking alcoholic beverages.   DO NOT smokE.   Do not wear tight clothing around your chest or stomach.   Eat smaller meals and eat more frequently. This keeps your stomach from getting too full. Eat slowly.   Do not lie down for 2 or 3 hours after eating. Do not eat or drink anything 1 to 2 hours before going to bed.   Avoid caffeine beverages (colas, coffee, cocoa, tea), fatty foods, citrus fruits and all other foods and drinks that contain acid and that seem to increase the problems.   Avoid bending over, especially after eating OR STRAINING. Anything that increases the pressure in your belly increases the amount of acid that may be pushed up into your esophagus.   Gastritis  Gastritis is an inflammation (the body's way of reacting to injury and/or infection) of the stomach. It is often caused by viral or bacterial (germ) infections. It can also be caused BY ASPIRIN, BC/GOODY POWDER'S, (IBUPROFEN) MOTRIN, OR ALEVE (NAPROXEN), chemicals (including alcohol), SPICY FOODS, and medications. This illness may be associated with generalized malaise (feeling tired, not well), UPPER ABDOMINAL STOMACH cramps, and fever. One common bacterial cause of  gastritis is an organism known as H. Pylori. This can be treated with antibiotics.

## 2014-04-11 NOTE — OR Nursing (Addendum)
1042  Heartrate 41-45; per dr fields, give 0.5 atropine iv now.  Given 1045.  Heart rate to mid 50's.  1046 verbal order Dr Oneida Alar, give another 0.5mg  atropine iv now.  Given.  Heart rate increased to mid 80's.

## 2014-04-11 NOTE — Op Note (Signed)
[place], [address]   COLONOSCOPY PROCEDURE REPORT  PATIENT: Danny [name], Danny [name]  MR#: [id number] BIRTHDATE: 1931/09/17 , 83  yrs. old GENDER: male ENDOSCOPIST: [name], MD REFERRED YF:[id number] Laurance Flatten, M.D. PROCEDURE DATE:  Apr 21, 2014 PROCEDURE:   Colonoscopy, diagnostic INDICATIONS:hematochezia. MEDICATIONS: Demerol 25 mg IV, Versed 4 mg IV, and Atropine 1 mg IV   DESCRIPTION OF PROCEDURE:    Physical exam was performed.  Informed consent was obtained from the patient after explaining the benefits, risks, and alternatives to procedure.  The patient was connected to monitor and placed in left lateral position. Continuous oxygen was provided by nasal cannula and IV medicine administered through an indwelling cannula.  After administration of sedation and rectal exam, the patients rectum was intubated and the EC-[id number] ([id number])  colonoscope was advanced under direct visualization to the ileum.  The scope was removed slowly by carefully examining the color, texture, anatomy, and integrity mucosa on the way out.  The patient was recovered in endoscopy and discharged home in satisfactory condition.    COLON FINDINGS: The examined terminal ileum appeared to be normal. , There was mild diverticulosis noted in the sigmoid colon with associated muscular hypertrophy.  , The colon was redundant. Manual abdominal counter-pressure was used to reach the cecum, and Moderate sized external hemorrhoids were found.  PREP QUALITY: good.  CECAL W/D TIME: 25       minutes COMPLICATIONS: HR 14-10 & ATROPINE GIVEN HR UP TO 70-80  ENDOSCOPIC IMPRESSION: 1.   NORMAL terminal ileum appeared to be normal 2.   Mild diverticulosis in the sigmoid colon 3.   The LEFT colon IS redundant  RECOMMENDATIONS: HIGH FIBER DIET OPV JUN 2016    _______________________________ eSignedDanie Binder, MD 21-Apr-2014 8:22 PM   CPT CODES: ICD CODES:  The ICD  and CPT codes recommended by this software are interpretations from the data that the clinical staff has captured with the software.  The verification of the translation of this report to the ICD and CPT codes and modifiers is the sole responsibility of the health care institution and practicing physician where this report was generated.  [employer] will not be held responsible for the validity of the ICD and CPT codes included on this report.  AMA assumes no liability for data contained or not contained herein. CPT is a Designer, television/film set of the [employer].

## 2014-04-11 NOTE — Op Note (Signed)
[facility] [address] [place], [address]   ENDOSCOPY PROCEDURE REPORT  PATIENT: [name], Danny [name]  MR#: [id number] BIRTHDATE: September 29, 1931 , 83  yrs. old GENDER: male  ENDOSCOPIST: [name], MD REFFERED AN:VBTYOM Laurance Flatten, M.D.  PROCEDURE DATE:  05-10-2014 PROCEDURE:   EGD with biopsy and EGD with dilatation over guidewire   INDICATIONS:1.  dysphagia. MEDICATIONS: TCS+ Versed 1 mg IV and Demerol 25 mg IV TOPICAL ANESTHETIC: Viscous Xylocaine  DESCRIPTION OF PROCEDURE:   After the risks benefits and alternatives of the procedure were thoroughly explained, informed consent was obtained.  The [id number] ([id number])  endoscope was introduced through the mouth and advanced to the second portion of the duodenum. The instrument was slowly withdrawn as the mucosa was carefully examined.  Prior to withdrawal of the scope, the guidwire was placed.  The esophagus was dilated successfully.  The patient was recovered in endoscopy and discharged home in satisfactory condition.   ESOPHAGUS: A stricture was found at the gastroesophageal junction. The stenosis was traversable with the endoscope.  STOMACH: Mild erosive gastritis (inflammation) was found in the gastric antrum. Multiple biopsies were performed using cold forceps.   DUODENUM: The duodenal mucosa showed no abnormalities in the bulb and second portion of the duodenum and duodenal bulb.   D1/D2 NARROWED TO  10-11 MM.  SCOPE PASSED WITH MILD RESISTANCE.   Dilation was then performed at the gastroesphageal junction Dilator: Savary over guidewire Size(s): 12.8-16 MM Resistance: minimal Heme: yes  COMPLICATIONS: There were no immediate complications.  ENDOSCOPIC IMPRESSION: 1.   Stricture at the gastroesophageal junction 2.   MODERATE Erosive gastritis (inflammation) was found in the gastric antrum; multiple 3.   DUODENAL WEB  RECOMMENDATIONS: START OMEPRAZOLE.  TAKE 30 MINUTES PRIOR TO YOUR MEALS TWICE  DAILY FOR 3 MOS THEN ONCE DAILY FOREVER. FOLLOW A LOW FAT DIET. AWAIT BIOPSY. Follow up in JUN 2016.   _______________________________ eSignedDanie Binder, MD 2014/05/10 8:17 PM   CPT CODES: ICD CODES:  The ICD and CPT codes recommended by this software are interpretations from the data that the clinical staff has captured with the software.  The verification of the translation of this report to the ICD and CPT codes and modifiers is the sole responsibility of the health care institution and practicing physician where this report was generated.  [employer] will not be held responsible for the validity of the ICD and CPT codes included on this report.  AMA assumes no liability for data contained or not contained herein. CPT is a Designer, television/film set of the [employer].

## 2014-04-11 NOTE — H&P (Signed)
Primary Care Physician:  Redge Gainer, MD Primary Gastroenterologist:  Dr. Oneida Alar  Pre-Procedure History & Physical: HPI:  Danny [name]. is a 81 y.o. male here for DYSPHAGIA/brbpr.  Past Medical History  Diagnosis Date  . Anxiety   . Hyperlipidemia   . Hypertension   . Vitamin D deficiency   . C. difficile colitis    Past Surgical History  Procedure Laterality Date  . Coronary artery bypass graft    . Carotid endarterectomy Left   . Carotid-subclavian bypass graft Right   . Colonoscopy  2005    hyperplastic polyp x 1 with no adenoma  . Skin cancer excision     Prior to Admission medications   Medication Sig Start Date End Date Taking? Authorizing Provider  ALPRAZolam (XANAX) 0.25 MG tablet TAKE ONE TABLET BY MOUTH AT BEDTIME AS NEEDED Patient taking differently: Take 0.125 mg by mouth at bedtime as needed for sleep. TAKE ONE TABLET BY MOUTH AT BEDTIME AS NEEDED 01/14/14  Yes Chipper Herb, MD  aspirin 325 MG EC tablet Take 325 mg by mouth every morning.    Yes Historical Provider, MD  Cholecalciferol (VITAMIN D-3 PO) Take 2,000 mg by mouth every morning.    Yes Historical Provider, MD  fluticasone (FLONASE) 50 MCG/ACT nasal spray Place 2 sprays into both nostrils daily. Patient taking differently: Place 2 sprays into both nostrils daily as needed for allergies.  01/14/14  Yes Chipper Herb, MD  losartan (COZAAR) 50 MG tablet Take 1 tablet (50 mg total) by mouth every morning. 09/18/13  Yes Chipper Herb, MD  lovastatin (MEVACOR) 40 MG tablet Take 1 tablet (40 mg total) by mouth every morning. 09/18/13  Yes Chipper Herb, MD  metoprolol (LOPRESSOR) 50 MG tablet Take 0.5 tablets (25 mg total) by mouth 2 (two) times daily. 1/2 TAB BID 09/18/13  Yes Chipper Herb, MD  Multiple Vitamin (MULTIVITAMIN) tablet Take 1 tablet by mouth every morning.    Yes Historical Provider, MD  NITROSTAT 0.4 MG SL tablet PLACE 1 TABLET (0.4 MG TOTAL) UNDER THE TONGUE EVERY 5 (FIVE) MINUTES AS  NEEDED FOR CHEST PAIN. 03/21/14  Yes Josue Hector, MD  Omega-3 Fatty Acids (FISH OIL) 1000 MG CAPS Take 2 capsules by mouth every morning.    Yes Historical Provider, MD  polyethylene glycol-electrolytes (TRILYTE) 420 G solution Take 4,000 mLs by mouth as directed. 02/27/14  Yes [name], MD  Probiotic Product (ALIGN) 4 MG CAPS Take 4 mg by mouth daily as needed.    Yes Historical Provider, MD    Allergies as of 02/27/2014 - Review Complete 02/27/2014  Allergen Reaction Noted  . Doxycycline Diarrhea 09/17/2012  . Contrast media [iodinated diagnostic agents]  05/30/2012  . Eszopiclone  05/18/2010  . Lunesta [eszopiclone]  05/30/2012  . Morphine and related Nausea And Vomiting 02/27/2014    Family History  Problem Relation Age of Onset  . Heart disease Mother   . Stroke Mother   . Stroke Father   . Diabetes Son   . Colon cancer Neg Hx     "not that I know of."    History   Social History  . Marital Status: Married    Spouse Name: N/A  . Number of Children: N/A  . Years of Education: N/A   Occupational History  . Not on file.   Social History Main Topics  . Smoking status: Former Smoker    Types: Cigarettes    Start date:  01/24/1950    Quit date: 01/24/1961  . Smokeless tobacco: Never Used  . Alcohol Use: 0.0 oz/week    0 Standard drinks or equivalent per week     Comment: not drank since 12/2009  . Drug Use: No  . Sexual Activity:    Partners: Female   Other Topics Concern  . Not on file   Social History Narrative    Review of Systems: See HPI, otherwise negative ROS   Physical Exam: BP 182/68 mmHg  Pulse 59  Temp(Src) 97.5 F (36.4 C) (Oral)  Resp 17  Ht 6\' 2"  (1.88 m)  Wt 162 lb (73.483 kg)  BMI 20.79 kg/m2  SpO2 100% General:   Alert,  pleasant and cooperative in NAD Head:  Normocephalic and atraumatic. Neck:  Supple; Lungs:  Clear throughout to auscultation.    Heart:  Regular rate and rhythm. Abdomen:  Soft, nontender and nondistended.  Normal bowel sounds, without guarding, and without rebound.   Neurologic:  Alert and  oriented x4;  grossly normal neurologically.  Impression/Plan:    DYSPHAGIA/brbpr PLAN:  TCS/EGD/DIL TODAY

## 2014-04-14 ENCOUNTER — Encounter (HOSPITAL_COMMUNITY): Payer: Self-pay | Admitting: Gastroenterology

## 2014-04-14 ENCOUNTER — Encounter: Payer: Self-pay | Admitting: Gastroenterology

## 2014-04-28 ENCOUNTER — Telehealth: Payer: Self-pay | Admitting: Gastroenterology

## 2014-04-28 NOTE — Telephone Encounter (Signed)
Pt called to say that he had procedure done on 3/18 by SF and hasn't heard back regarding his results. He also said that SF told him he has diverticulitis and gastritis and would like to know more information about that along with the results. Please advise and call him at [phone]

## 2014-04-29 NOTE — Telephone Encounter (Signed)
I called pt and he said he was following protocol calling for results if had not heard anything in 2 weeks. I explained that Dr. Oneida Alar is running a little behind with results since she has had to cover at the hospital while Dr. Gala Romney was on vacation.

## 2014-04-29 NOTE — Telephone Encounter (Signed)
Please call pt. His stomach Bx shows mild gastritis.    TAKE OMEPRAZOLE 30 MINUTES PRIOR TO YOUR MEALS TWICE DAILY FOR 3 MOS THEN ONCE DAILY FOREVER.  FOLLOW A LOW FAT DIET. AVOID ITEMS THAT CAUSE BLOATING.   Follow up in JUN 2016 E30 DYSPHAGIA/HEME POSITIVE STOOL.[name]

## 2014-04-30 NOTE — Telephone Encounter (Signed)
PATIENT HAS APPOINTMENT

## 2014-05-01 NOTE — Telephone Encounter (Signed)
Pt is aware.  

## 2014-05-01 NOTE — Telephone Encounter (Signed)
Routing to DS 

## 2014-05-14 NOTE — Progress Notes (Signed)
Patient ID: Danny [name]., male   DOB: [birth year], 81 y.o.   MRN: [id number] Danny [name] is seen today for distant CABG, 2003 carotid disease with left CEA and right subclavian bypass. in 2009 He has HTN and elevated lipids. last visit he had had a syncopal episode. There was no cardiac etiology found. He has great exercise tolerance with no angina. He has improved strength in his right arm and no TIA symptoms. He has residual right carotid disease.  Compliant with meds   Duplex 03/2014 with elevated proximal anastomotic velocity from subclavian to CCA bypass 4.86m/sec 35-36% RICA 14-43% LICA  Recent EGD [facility] with gastritis and diverticulitis on omeprazole bid    ROS: Denies fever, malais, weight loss, blurry vision, decreased visual acuity, cough, sputum, SOB, hemoptysis, pleuritic pain, palpitaitons, heartburn, abdominal pain, melena, lower extremity edema, claudication, or rash.  All other systems reviewed and negative  General:  BP 154 systolic  On left and 008 systolic on right  Affect appropriate Healthy:  appears stated age 81: normal Neck supple with no adenopathy JVP normal bilateral  bruits no thyromegaly Lungs clear with no wheezing and good diaphragmatic motion Heart:  S1/S2 no murmur, no rub, gallop or click PMI normal  Right carotid subclavian bypass  Abdomen: benighn, BS positve, no tenderness, no AAA no bruit.  No HSM or HJR Distal pulses intact with no bruits No edema Neuro non-focal Skin warm and dry No muscular weakness   Current Outpatient Prescriptions  Medication Sig Dispense Refill  . ALPRAZolam (XANAX) 0.25 MG tablet TAKE ONE TABLET BY MOUTH AT BEDTIME AS NEEDED (Patient taking differently: Take 0.125 mg by mouth at bedtime as needed for sleep. TAKE ONE TABLET BY MOUTH AT BEDTIME AS NEEDED) 30 tablet 3  . aspirin 325 MG EC tablet Take 325 mg by mouth every morning.     . Cholecalciferol (VITAMIN D-3 PO) Take 2,000 mg by mouth every morning.     . fluticasone  (FLONASE) 50 MCG/ACT nasal spray Place 2 sprays into both nostrils daily. (Patient taking differently: Place 2 sprays into both nostrils daily as needed for allergies. ) 16 g 6  . losartan (COZAAR) 50 MG tablet Take 1 tablet (50 mg total) by mouth every morning. 90 tablet 3  . lovastatin (MEVACOR) 40 MG tablet Take 1 tablet (40 mg total) by mouth every morning. 90 tablet 3  . metoprolol (LOPRESSOR) 50 MG tablet Take 0.5 tablets (25 mg total) by mouth 2 (two) times daily. 1/2 TAB BID 90 tablet 3  . Multiple Vitamin (MULTIVITAMIN) tablet Take 1 tablet by mouth every morning.     [name] NITROSTAT 0.4 MG SL tablet PLACE 1 TABLET (0.4 MG TOTAL) UNDER THE TONGUE EVERY 5 (FIVE) MINUTES AS NEEDED FOR CHEST PAIN. 25 tablet 1  . Omega-3 Fatty Acids (FISH OIL) 1000 MG CAPS Take 2 capsules by mouth every morning.     [name] omeprazole (PRILOSEC) 20 MG capsule 1 PO 30 mins prior to breakfast and supper 60 capsule 11  . polyethylene glycol-electrolytes (TRILYTE) 420 G solution Take 4,000 mLs by mouth as directed. 4000 mL 0  . Probiotic Product (ALIGN) 4 MG CAPS Take 4 mg by mouth daily as needed.      No current facility-administered medications for this visit.    Allergies  Doxycycline; Contrast media; Eszopiclone; Lunesta; and Morphine and related  Electrocardiogram:   11/30/12  SR rate 60 LAE otherwise normal  05/15/14  SR ate 47  Normal ECG   Assessment  and Plan CAD:  Distant CABG stable no chest pain   Bradycardia.:  Decrease metoprolol to 1/2 tab daily f/u with me 6-8 weeks  PVD:  Refer back to [facility]  Having postural dizzyness twice / day with possible stenosis in carotid limb of right bypass and VB steal HTN:  Well controlled should follow BP in left arm which is higher  GI:  Improved with recent diagnosis of gastritis and diverticulitis  Omeprazole bid   [name]

## 2014-05-15 ENCOUNTER — Ambulatory Visit (INDEPENDENT_AMBULATORY_CARE_PROVIDER_SITE_OTHER): Payer: Commercial Managed Care - HMO | Admitting: Cardiovascular Disease

## 2014-05-15 ENCOUNTER — Encounter: Payer: Self-pay | Admitting: Cardiovascular Disease

## 2014-05-15 VITALS — BP 130/62 | HR 53 | Ht 74.0 in | Wt 164.0 lb

## 2014-05-15 DIAGNOSIS — I6529 Occlusion and stenosis of unspecified carotid artery: Secondary | ICD-10-CM | POA: Diagnosis not present

## 2014-05-15 DIAGNOSIS — Z136 Encounter for screening for cardiovascular disorders: Secondary | ICD-10-CM

## 2014-05-15 MED ORDER — METOPROLOL TARTRATE 25 MG PO TABS: 12.5000 mg | ORAL_TABLET | Freq: Two times a day (BID) | ORAL | Status: DC

## 2014-05-15 MED ORDER — METOPROLOL TARTRATE 25 MG PO TABS: 25.0000 mg | ORAL_TABLET | Freq: Once | ORAL | Status: DC

## 2014-05-15 NOTE — Addendum Note (Signed)
Addended by: Levonne Hubert on: 05/15/2014 10:29 AM   Modules accepted: Orders, Level of Service

## 2014-05-15 NOTE — Patient Instructions (Signed)
Your physician recommends that you schedule a follow-up appointment in: 6 - 8 weeks with Dr. Johnsie Cancel in [place] has recommended you make the following change in your medication:   Decease Metoprolol to 25 mg one Tablet Daily   Your physician recommends that you schedule a follow-up appointment With Dr. Doren Custard at [facility]

## 2014-05-22 ENCOUNTER — Other Ambulatory Visit (INDEPENDENT_AMBULATORY_CARE_PROVIDER_SITE_OTHER): Payer: Commercial Managed Care - HMO

## 2014-05-22 DIAGNOSIS — I700 Atherosclerosis of aorta: Secondary | ICD-10-CM

## 2014-05-22 DIAGNOSIS — I739 Peripheral vascular disease, unspecified: Secondary | ICD-10-CM

## 2014-05-22 DIAGNOSIS — I10 Essential (primary) hypertension: Secondary | ICD-10-CM

## 2014-05-22 DIAGNOSIS — D473 Essential (hemorrhagic) thrombocythemia: Secondary | ICD-10-CM

## 2014-05-22 DIAGNOSIS — E559 Vitamin D deficiency, unspecified: Secondary | ICD-10-CM | POA: Diagnosis not present

## 2014-05-22 DIAGNOSIS — I25810 Atherosclerosis of coronary artery bypass graft(s) without angina pectoris: Secondary | ICD-10-CM

## 2014-05-22 DIAGNOSIS — E7800 Pure hypercholesterolemia, unspecified: Secondary | ICD-10-CM

## 2014-05-22 DIAGNOSIS — N400 Enlarged prostate without lower urinary tract symptoms: Secondary | ICD-10-CM | POA: Diagnosis not present

## 2014-05-22 DIAGNOSIS — D75839 Thrombocytosis, unspecified: Secondary | ICD-10-CM

## 2014-05-22 LAB — POCT CBC
Granulocyte percent: 61 %G (ref 37–80)
HEMATOCRIT: 47.4 % (ref 43.5–53.7)
Hemoglobin: 14.8 g/dL (ref 14.1–18.1)
Lymph, poc: 2 (ref 0.6–3.4)
MCH: 29.1 pg (ref 27–31.2)
MCHC: 31.1 g/dL — AB (ref 31.8–35.4)
MCV: 93.4 fL (ref 80–97)
MPV: 8.6 fL (ref 0–99.8)
POC Granulocyte: 3.8 (ref 2–6.9)
POC LYMPH PERCENT: 31.7 %L (ref 10–50)
Platelet Count, POC: 287 10*3/uL (ref 142–424)
RBC: 5.08 M/uL (ref 4.69–6.13)
RDW, POC: 13.7 %
WBC: 6.2 10*3/uL (ref 4.6–10.2)

## 2014-05-22 NOTE — Progress Notes (Signed)
Lab only 

## 2014-05-23 LAB — BMP8+EGFR
BUN/Creatinine Ratio: 11 (ref 10–22)
BUN: 11 mg/dL (ref 8–27)
CO2: 23 mmol/L (ref 18–29)
Calcium: 8.9 mg/dL (ref 8.6–10.2)
Chloride: 97 mmol/L (ref 97–108)
Creatinine, Ser: 1.03 mg/dL (ref 0.76–1.27)
GFR calc Af Amer: 78 mL/min/{1.73_m2} (ref >59)
GFR calc non Af Amer: 68 mL/min/{1.73_m2} (ref >59)
Glucose: 91 mg/dL (ref 65–99)
POTASSIUM: 4.5 mmol/L (ref 3.5–5.2)
SODIUM: 136 mmol/L (ref 134–144)

## 2014-05-23 LAB — NMR, LIPOPROFILE
Cholesterol: 179 mg/dL (ref 100–199)
HDL CHOLESTEROL BY NMR: 73 mg/dL (ref >39)
HDL Particle Number: 39.1 umol/L (ref >=30.5)
LDL PARTICLE NUMBER: 868 nmol/L (ref <1000)
LDL Size: 21.2 nm (ref >20.5)
LDL-C: 96 mg/dL (ref 0–99)
LP-IR Score: <25 (ref <=45)
Small LDL Particle Number: 165 nmol/L (ref <=527)
Triglycerides by NMR: 48 mg/dL (ref 0–149)

## 2014-05-23 LAB — HEPATIC FUNCTION PANEL
ALBUMIN: 4 g/dL (ref 3.5–4.7)
ALT: 10 IU/L (ref 0–44)
AST: 16 IU/L (ref 0–40)
Alkaline Phosphatase: 61 IU/L (ref 39–117)
Bilirubin Total: 0.4 mg/dL (ref 0.0–1.2)
Bilirubin, Direct: 0.14 mg/dL (ref 0.00–0.40)
Total Protein: 6.3 g/dL (ref 6.0–8.5)

## 2014-05-23 LAB — VITAMIN D 25 HYDROXY (VIT D DEFICIENCY, FRACTURES): Vit D, 25-Hydroxy: 63.4 ng/mL (ref 30.0–100.0)

## 2014-05-27 ENCOUNTER — Encounter: Payer: Self-pay | Admitting: Family Medicine

## 2014-05-27 ENCOUNTER — Ambulatory Visit (INDEPENDENT_AMBULATORY_CARE_PROVIDER_SITE_OTHER): Payer: Commercial Managed Care - HMO | Admitting: Family Medicine

## 2014-05-27 VITALS — BP 113/51 | HR 54 | Temp 97.3°F | Ht 74.0 in | Wt 164.0 lb

## 2014-05-27 DIAGNOSIS — D75839 Thrombocytosis, unspecified: Secondary | ICD-10-CM

## 2014-05-27 DIAGNOSIS — I700 Atherosclerosis of aorta: Secondary | ICD-10-CM

## 2014-05-27 DIAGNOSIS — D473 Essential (hemorrhagic) thrombocythemia: Secondary | ICD-10-CM

## 2014-05-27 DIAGNOSIS — N400 Enlarged prostate without lower urinary tract symptoms: Secondary | ICD-10-CM | POA: Diagnosis not present

## 2014-05-27 DIAGNOSIS — I499 Cardiac arrhythmia, unspecified: Secondary | ICD-10-CM | POA: Diagnosis not present

## 2014-05-27 DIAGNOSIS — E7800 Pure hypercholesterolemia, unspecified: Secondary | ICD-10-CM

## 2014-05-27 DIAGNOSIS — I25810 Atherosclerosis of coronary artery bypass graft(s) without angina pectoris: Secondary | ICD-10-CM | POA: Diagnosis not present

## 2014-05-27 DIAGNOSIS — I6521 Occlusion and stenosis of right carotid artery: Secondary | ICD-10-CM

## 2014-05-27 DIAGNOSIS — I739 Peripheral vascular disease, unspecified: Secondary | ICD-10-CM

## 2014-05-27 MED ORDER — METOPROLOL TARTRATE 25 MG PO TABS: 25.0000 mg | ORAL_TABLET | Freq: Once | ORAL | Status: DC

## 2014-05-27 MED ORDER — OMEPRAZOLE 20 MG PO CPDR: DELAYED_RELEASE_CAPSULE | ORAL | Status: DC

## 2014-05-27 MED ORDER — LOSARTAN POTASSIUM 50 MG PO TABS: 50.0000 mg | ORAL_TABLET | Freq: Every morning | ORAL | Status: DC

## 2014-05-27 MED ORDER — LOVASTATIN 40 MG PO TABS: 40.0000 mg | ORAL_TABLET | Freq: Every morning | ORAL | Status: DC

## 2014-05-27 NOTE — Patient Instructions (Addendum)
Medicare Annual Wellness Visit  [facility] and the medical providers at [facility] strive to bring you the best medical care.  In doing so we not only want to address your current medical conditions and concerns but also to detect new conditions early and prevent illness, disease and health-related problems.    Medicare offers a yearly Wellness Visit which allows our clinical staff to assess your need for preventative services including immunizations, lifestyle education, counseling to decrease risk of preventable diseases and screening for fall risk and other medical concerns.    This visit is provided free of charge (no copay) for all Medicare recipients. The clinical pharmacists at [facility] have begun to conduct these Wellness Visits which will also include a thorough review of all your medications.    As you primary medical provider recommend that you make an appointment for your Annual Wellness Visit if you have not done so already this year.  You may set up this appointment before you leave today or you may call back ([phone]) and schedule an appointment.  Please make sure when you call that you mention that you are scheduling your Annual Wellness Visit with the clinical pharmacist so that the appointment may be made for the proper length of time.     Continue current medications. Continue good therapeutic lifestyle changes which include good diet and exercise. Fall precautions discussed with patient. If an FOBT was given today- please return it to our front desk. If you are over 44 years old - you may need Prevnar 17 or the adult Pneumonia vaccine.  Flu Shots are still available at our office. If you still haven't had one please call to set up a nurse visit to get one.   After your visit with [place] today you will receive a survey in the mail or online from [employer] regarding your care with [place]. Please take a moment to  fill this out. Your feedback is very important to [place] as you can help [place] better understand your patient needs as well as improve your experience and satisfaction. WE CARE ABOUT YOU!!!    follow-up with Dr. Doren Custard when that appointment becomes available  Continue regular follow-up with cardiology

## 2014-05-27 NOTE — Progress Notes (Addendum)
Subjective:    Patient ID: Danny Loron., male    DOB: Nov 08, 1931, 81 y.o.   MRN: [id number]  HPI Pt here for follow up and management of chronic medical problems which includes hypertension and hyperlipidemia. He is taking medications regularly. The patient has had his recent lab work done and this will be reviewed with him during office today. His cholesterol numbers are greatly improved. He does complain of some dizziness and he is seeing a specialist about this. He is requesting refills on several of his medications and this will be done.  The patient has recently seen the cardiologist and has had Dopplers done of his neck which showed a greater than 75% blockage in the right carotid. He was subsequently referred to Dr. Doren Custard with vascular and vein specialist and is waiting for an appointment time with him to further evaluate this blockage. He is also had an endoscopy and colonoscopy by the gastroenterologist and was started on omeprazole for gastritis and says this is greatly helped his swallowing and his constipation. He did have a dilatation with the procedure. Otherwise the patient has no other complaints and his recent lab work was reviewed with him and everything was excellent.        Patient Active Problem List   Diagnosis Date Noted  . Hematest positive stools   . Dysphagia, pharyngoesophageal phase   . Heme + stool 02/27/2014  . Constipation 02/27/2014  . Dysphagia 02/27/2014  . Thoracic aorta atherosclerosis 01/14/2014  . 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 Bilateral carotid bruits 05/27/2013  . Enteritis due to Clostridium difficile, history of 11/21/2012  . AAA (abdominal aortic aneurysm) without rupture 05/18/2010  . Hyperplasia of prostate 05/18/2010  . Carotid stenosis 05/18/2010  . CAROTID BRUIT 01/06/2010  . HYPERCHOLESTEROLEMIA  IIA 05/19/2008  . SYNCOPE-CAROTID SINUS 05/19/2008  . BLURRED VISION 05/19/2008  . HYPERTENSION,  UNSPECIFIED 05/19/2008  . CAD, ARTERY BYPASS GRAFT 05/19/2008  . 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 ALPRAZolam (XANAX) 0.25 MG tablet TAKE ONE TABLET BY MOUTH AT BEDTIME AS NEEDED (Patient taking differently: Take 0.125 mg by mouth at bedtime as needed for sleep. TAKE ONE TABLET BY MOUTH AT BEDTIME AS NEEDED)  . aspirin 325 MG EC tablet Take 325 mg by mouth every morning.   . Cholecalciferol (VITAMIN D-3 PO) Take 2,000 mg by mouth every morning.   . fluticasone (FLONASE) 50 MCG/ACT nasal spray Place 2 sprays into both nostrils daily. (Patient taking differently: Place 2 sprays into both nostrils daily as needed for allergies. )  . losartan (COZAAR) 50 MG tablet Take 1 tablet (50 mg total) by mouth every morning.  . lovastatin (MEVACOR) 40 MG tablet Take 1 tablet (40 mg total) by mouth every morning.  . metoprolol tartrate (LOPRESSOR) 25 MG tablet Take 1 tablet (25 mg total) by mouth once. 1/2 TAB BID  . Multiple Vitamin (MULTIVITAMIN) tablet Take 1 tablet by mouth every morning.   [name] NITROSTAT 0.4 MG SL tablet PLACE 1 TABLET (0.4 MG TOTAL) UNDER THE TONGUE EVERY 5 (FIVE) MINUTES AS NEEDED FOR CHEST PAIN. (Patient not taking: Reported on 05/27/2014)  . Omega-3 Fatty Acids (FISH OIL) 1000 MG CAPS Take 2 capsules by mouth every morning.   [name] omeprazole (PRILOSEC) 20 MG capsule 1 PO 30 mins prior to breakfast and supper  . polyethylene glycol-electrolytes (TRILYTE) 420 G solution Take 4,000 mLs by mouth  as directed.  . Probiotic Product (ALIGN) 4 MG CAPS Take 4 mg by mouth daily as needed.   . [DISCONTINUED] losartan (COZAAR) 50 MG tablet Take 1 tablet (50 mg total) by mouth every morning.  . [DISCONTINUED] lovastatin (MEVACOR) 40 MG tablet Take 1 tablet (40 mg total) by mouth every morning.  . [DISCONTINUED] metoprolol tartrate (LOPRESSOR) 25 MG tablet Take 1 tablet (25 mg total) by mouth once. 1/2 TAB BID  . [DISCONTINUED]  omeprazole (PRILOSEC) 20 MG capsule 1 PO 30 mins prior to breakfast and supper   No facility-administered encounter medications on file as of 05/27/2014.       Review of Systems  Constitutional: Negative.   HENT: Negative.   Eyes: Negative.   Respiratory: Negative.   Cardiovascular: Negative.   Gastrointestinal: Negative.   Endocrine: Negative.   Genitourinary: Negative.   Musculoskeletal: Negative.   Skin: Negative.   Allergic/Immunologic: Negative.   Neurological: Positive for dizziness.  Hematological: Negative.   Psychiatric/Behavioral: Negative.        Objective:   Physical Exam  Constitutional: He is oriented to person, place, and time. He appears well-developed and well-nourished.  The patient is alert and pleasant and complains of occasional dizziness.  HENT:  Head: Normocephalic and atraumatic.  Right Ear: External ear normal.  Left Ear: External ear normal.  Nose: Nose normal.  Mouth/Throat: Oropharynx is clear and moist. No oropharyngeal exudate.  Eyes: Conjunctivae and EOM are normal. Pupils are equal, round, and reactive to light. Right eye exhibits no discharge. Left eye exhibits no discharge. No scleral icterus.  Neck: Normal range of motion. Neck supple. No thyromegaly present.  The patient has a loud right carotid bruit. He also has a left supraclavicular bruit.  Cardiovascular: Normal rate, normal heart sounds and intact distal pulses.   No murmur heard. The rhythm was irregular today at 52-60/m.  Pulmonary/Chest: Effort normal and breath sounds normal. No respiratory distress. He has no wheezes. He has no rales. He exhibits no tenderness.  Clear anteriorly and posteriorly  Abdominal: Soft. Bowel sounds are normal. He exhibits no mass. There is no tenderness. There is no rebound and no guarding.  There is no abdominal tenderness or bruits or masses.  Musculoskeletal: Normal range of motion. He exhibits no edema or tenderness.  Lymphadenopathy:    He has  no cervical adenopathy.  Neurological: He is alert and oriented to person, place, and time. He has normal reflexes. No cranial nerve deficit.  Skin: Skin is warm and dry. No rash noted. No erythema. No pallor.  Psychiatric: He has a normal mood and affect. His behavior is normal. Judgment and thought content normal.  Nursing note and vitals reviewed.  BP 113/51 mmHg  Pulse 54  Temp(Src) 97.3 F (36.3 C) (Oral)  Ht 6\' 2"  (1.88 m)  Wt 164 lb (74.39 kg)  BMI 21.05 kg/m2  The most recent EKG with a cardiologist had a sinus bradycardia with a regular rhythm. The rhythm today was irregularly and an EKG was performed   EKG: Frequent PVCs with a rate of 60/m We will send a copy to Dr. Liliane Channel for his review       Assessment & Plan:  1. HYPERCHOLESTEROLEMIA  IIA -The patient's most recent cholesterol numbers were excellent and improved with advanced lipid testing and he should continue with his current treatment  2. Atherosclerosis of coronary artery bypass graft of native heart without angina pectoris -The patient has recently seen and is followed by the cardiologist.  3. Hyperplasia of prostate -He is having no symptoms with this today.  4. Thoracic aorta atherosclerosis -He is continuing with aggressive therapeutic lifestyle changes and his cholesterol numbers are good and under control currently.  5. Thrombocytosis 9 the most recent CBC had a normal platelet count.  6. BPH (benign prostatic hyperplasia)  7. Peripheral vascular insufficiency -The patient is followed by the cardiologist and the vascular surgeon and has a visit soon with the vascular surgeon for follow-up of his carotid artery stenosis  8. Carotid artery stenosis, right -Follow-up with vascular surgeon  9. Irregular heart rhythm - EKG 12-Lead  The patient is requesting refills on his lovastatin, losartan, omeprazole, and metoprolol. These will be done for him before he leaves office today.  Patient  Instructions                       Medicare Annual Wellness Visit  [facility] and the medical providers at [facility] strive to bring you the best medical care.  In doing so we not only want to address your current medical conditions and concerns but also to detect new conditions early and prevent illness, disease and health-related problems.    Medicare offers a yearly Wellness Visit which allows our clinical staff to assess your need for preventative services including immunizations, lifestyle education, counseling to decrease risk of preventable diseases and screening for fall risk and other medical concerns.    This visit is provided free of charge (no copay) for all Medicare recipients. The clinical pharmacists at [facility] have begun to conduct these Wellness Visits which will also include a thorough review of all your medications.    As you primary medical provider recommend that you make an appointment for your Annual Wellness Visit if you have not done so already this year.  You may set up this appointment before you leave today or you may call back ([phone]) and schedule an appointment.  Please make sure when you call that you mention that you are scheduling your Annual Wellness Visit with the clinical pharmacist so that the appointment may be made for the proper length of time.     Continue current medications. Continue good therapeutic lifestyle changes which include good diet and exercise. Fall precautions discussed with patient. If an FOBT was given today- please return it to our front desk. If you are over 67 years old - you may need Prevnar 79 or the adult Pneumonia vaccine.  Flu Shots are still available at our office. If you still haven't had one please call to set up a nurse visit to get one.   After your visit with [place] today you will receive a survey in the mail or online from [employer] regarding your care with [place]. Please  take a moment to fill this out. Your feedback is very important to [place] as you can help [place] better understand your patient needs as well as improve your experience and satisfaction. WE CARE ABOUT YOU!!!    follow-up with Dr. Doren Custard when that appointment becomes available  Continue regular follow-up with cardiology   Arrie Senate MD

## 2014-05-27 NOTE — Addendum Note (Signed)
Addended by: Zannie Cove on: 05/27/2014 12:34 PM   Modules accepted: Orders

## 2014-05-28 ENCOUNTER — Other Ambulatory Visit: Payer: Self-pay | Admitting: *Deleted

## 2014-05-28 ENCOUNTER — Encounter: Payer: Self-pay | Admitting: *Deleted

## 2014-05-28 NOTE — Progress Notes (Signed)
Dr Johnsie Cancel,  Dr Laurance Flatten seen this patient yesterday. He is still having some dizziness and irregular heart rate at visit. We done an EKG in office- would you be willing to take a look at it and let us or the pt know if you have any concerns.  Thank you so much, hope you have a great day.   Georgina Pillion, LPN

## 2014-05-28 NOTE — Progress Notes (Signed)
ECG was sinus with PVC's no afib

## 2014-06-13 DIAGNOSIS — H3531 Nonexudative age-related macular degeneration: Secondary | ICD-10-CM | POA: Diagnosis not present

## 2014-06-20 ENCOUNTER — Encounter: Payer: Self-pay | Admitting: Vascular Surgery

## 2014-06-25 ENCOUNTER — Encounter: Payer: Commercial Managed Care - HMO | Admitting: Vascular Surgery

## 2014-07-02 ENCOUNTER — Other Ambulatory Visit: Payer: Self-pay | Admitting: Family Medicine

## 2014-07-07 ENCOUNTER — Ambulatory Visit: Payer: Commercial Managed Care - HMO | Admitting: Cardiovascular Disease

## 2014-07-07 ENCOUNTER — Other Ambulatory Visit: Payer: Self-pay | Admitting: *Deleted

## 2014-07-07 ENCOUNTER — Encounter: Payer: Self-pay | Admitting: Vascular Surgery

## 2014-07-07 MED ORDER — LOVASTATIN 40 MG PO TABS: 40.0000 mg | ORAL_TABLET | Freq: Every morning | ORAL | Status: DC

## 2014-07-10 ENCOUNTER — Encounter: Payer: Self-pay | Admitting: Vascular Surgery

## 2014-07-10 ENCOUNTER — Ambulatory Visit (INDEPENDENT_AMBULATORY_CARE_PROVIDER_SITE_OTHER): Payer: Commercial Managed Care - HMO | Admitting: Vascular Surgery

## 2014-07-10 VITALS — BP 147/58 | HR 32 | Ht 74.0 in | Wt 164.7 lb

## 2014-07-10 DIAGNOSIS — I6523 Occlusion and stenosis of bilateral carotid arteries: Secondary | ICD-10-CM

## 2014-07-10 NOTE — Progress Notes (Signed)
Vascular and Vein Specialist of   Patient name: Danny [name]. MRN: [id number] DOB: 01-16-1932 Sex: male  REASON FOR CONSULT: Carotid disease. Consult from Dr. [name]  HPI: Danny [name]. is a 81 y.o. male who is referred for evaluation of a 60-79% right carotid stenosis. The patient underwent coronary revascularization around 2000. He subsequently had a left carotid endarterectomy by Dr. Drucie Opitz and then after that had a right carotid to subclavian bypass by Dr. Drucie Opitz also. Subsequent to that he had a right subclavian stent. He is followed by Dr. [name] and has routine duplex studies.   He is right-handed. He denies any history of stroke, TIAs, expressive or receptive aphasia, or amaurosis fugax. He is on aspirin. He is on a statin. He is not a smoker.  He denies any pain or paresthesias in his right arm.  He was also having some problems with dizziness but Dr. Johnsie Cancel made some adjustments to his medications and the symptoms resolved. The dizziness usually occurred after he was standing and did not appear to be related to using the right arm.  Past Medical History  Diagnosis Date  . Anxiety   . Hyperlipidemia   . Hypertension   . Vitamin D deficiency   . C. difficile colitis    Family History  Problem Relation Age of Onset  . Heart disease Mother   . Stroke Mother   . Stroke Father   . Diabetes Son   . Colon cancer Neg Hx     "not that I know of."   SOCIAL HISTORY: History  Substance Use Topics  . Smoking status: Former Smoker    Types: Cigarettes    Start date: 01/24/1950    Quit date: 01/24/1961  . Smokeless tobacco: Never Used  . Alcohol Use: 0.0 oz/week    0 Standard drinks or equivalent per week     Comment: not drank since 12/2009   Allergies  Allergen Reactions  . Doxycycline Diarrhea  . Contrast Media [Iodinated Diagnostic Agents]     Syncope  . Eszopiclone   . Lunesta [Eszopiclone]   . Morphine And Related Nausea And  Vomiting   Current Outpatient Prescriptions  Medication Sig Dispense Refill  . ALPRAZolam (XANAX) 0.25 MG tablet TAKE ONE TABLET BY MOUTH AT BEDTIME AS NEEDED (Patient taking differently: Take 0.125 mg by mouth at bedtime as needed for sleep. TAKE ONE TABLET BY MOUTH AT BEDTIME AS NEEDED) 30 tablet 3  . aspirin 325 MG EC tablet Take 325 mg by mouth every morning.     . Cholecalciferol (VITAMIN D-3 PO) Take 2,000 mg by mouth every morning.     . fluticasone (FLONASE) 50 MCG/ACT nasal spray Place 2 sprays into both nostrils daily. (Patient taking differently: Place 2 sprays into both nostrils daily as needed for allergies. ) 16 g 6  . lovastatin (MEVACOR) 40 MG tablet Take 1 tablet (40 mg total) by mouth every morning. 90 tablet 3  . metoprolol tartrate (LOPRESSOR) 25 MG tablet Take 1 tablet (25 mg total) by mouth once. 1/2 TAB BID 90 tablet 3  . Multiple Vitamin (MULTIVITAMIN) tablet Take 1 tablet by mouth every morning.     [name] NITROSTAT 0.4 MG SL tablet PLACE 1 TABLET (0.4 MG TOTAL) UNDER THE TONGUE EVERY 5 (FIVE) MINUTES AS NEEDED FOR CHEST PAIN. 25 tablet 1  . Omega-3 Fatty Acids (FISH OIL) 1000 MG CAPS Take 2 capsules by mouth every morning.     [name]  omeprazole (PRILOSEC) 20 MG capsule 1 PO 30 mins prior to breakfast and supper 180 capsule 3  . polyethylene glycol-electrolytes (TRILYTE) 420 G solution Take 4,000 mLs by mouth as directed. 4000 mL 0  . Probiotic Product (ALIGN) 4 MG CAPS Take 4 mg by mouth daily as needed.     [name] losartan (COZAAR) 50 MG tablet Take 1 tablet (50 mg total) by mouth every morning. (Patient not taking: Reported on 07/10/2014) 90 tablet 3   No current facility-administered medications for this visit.   REVIEW OF SYSTEMS: Valu.Nieves ] denotes positive finding; [  ] denotes negative finding  CARDIOVASCULAR:  [ ]  chest pain   [ ]  chest pressure   [ ]  palpitations   [ ]  orthopnea   [ ]  dyspnea on exertion   [ ]  claudication   [ ]  rest pain   [ ]  DVT   [ ]  phlebitis PULMONARY:   [ ]   productive cough   [ ]  asthma   [ ]  wheezing NEUROLOGIC:   [ ]  weakness  [ ]  paresthesias  [ ]  aphasia  [ ]  amaurosis  Valu.Nieves ] dizziness HEMATOLOGIC:   [ ]  bleeding problems   [ ]  clotting disorders MUSCULOSKELETAL:  [ ]  joint pain   [ ]  joint swelling [ ]  leg swelling GASTROINTESTINAL: [ ]   blood in stool  [ ]   hematemesis GENITOURINARY:  [ ]   dysuria  [ ]   hematuria PSYCHIATRIC:  [ ]  history of major depression INTEGUMENTARY:  [ ]  rashes  [ ]  ulcers CONSTITUTIONAL:  [ ]  fever   [ ]  chills  PHYSICAL EXAM: Filed Vitals:   07/10/14 0912 07/10/14 0916  BP: 130/57 147/58  Pulse: 32   Height: 6\' 2"  (1.88 m)   Weight: 164 lb 11.2 oz (74.707 kg)   SpO2: 97%    GENERAL: The patient is a well-nourished male, in no acute distress. The vital signs are documented above. CARDIOVASCULAR: There is a regular rate and rhythm. He has a right carotid bruit. Both feet are warm and well-perfused. He has no significant lower extremity swelling. PULMONARY: There is good air exchange bilaterally without wheezing or rales. ABDOMEN: Soft and non-tender with normal pitched bowel sounds.  MUSCULOSKELETAL: There are no major deformities or cyanosis. NEUROLOGIC: No focal weakness or paresthesias are detected. SKIN: There are no ulcers or rashes noted. PSYCHIATRIC: The patient has a normal affect.  DATA:  I have reviewed his carotid duplex scan that was done at the [facility] office. He has a 60-79% right carotid stenosis. End-diastolic velocity on the right is 69 cm/s and therefore I think this is clearly less than 80%. On the left side, he has a recurrent 40-59% stenosis. It is also noted that his right carotid subclavian bypass is patent although he has elevated velocities at the anastomosis to the common carotid artery. He does have a patent right subclavian artery stent.  MEDICAL ISSUES:  60-79% ASYMPTOMATIC RIGHT CAROTID STENOSIS: He will continue to have routine follow up carotid duplex scans done at the [facility]  office. I've explained that we would not consider right carotid endarterectomy in an asymptomatic patient unless the stenosis progressed to greater than 80%. He is on aspirin. He is on a statin. He is not a smoker. I'll be happy to see him back at any time if he develops new neurologic symptoms of the right carotid stenosis progresses to greater than 80%.  STATUS POST RIGHT CAROTID SUBCLAVIAN BYPASS: He does have a stenosis noted in  the bypass graft at the anastomosis to the common carotid artery. However, this is asymptomatic and subsequent to this bypass he has subclavian stent. Given that the subclavian stent on the right is patent unless concerned about the stenosis in the carotid subclavian bypass. In addition he is 81 years old and asymptomatic and certainly I would not recommend an aggressive approach to this stenosis in the graft at this point.   Deitra Mayo [facility] [place]: [phone]

## 2014-07-15 ENCOUNTER — Ambulatory Visit: Payer: Commercial Managed Care - HMO | Admitting: Nurse Practitioner

## 2014-07-15 NOTE — Progress Notes (Signed)
Patient ID: Danny Basey., male   DOB: 1931/08/18, 81 y.o.   MRN: [id number] Danny [name] is seen today for distant CABG, 2003 carotid disease with left CEA and right subclavian bypass. in 2009 He has HTN and elevated lipids. last visit he had had a syncopal episode. There was no cardiac etiology found. He has great exercise tolerance with no angina. He has improved strength in his right arm and no TIA symptoms. He has residual right carotid disease.  Compliant with meds   Duplex 03/2014 with elevated proximal anastomotic velocity from subclavian to CCA bypass 4.83m/sec 03-47% RICA 42-59% LICA  Seen by Dr Scot Dock and felt stenosis in subclavian bypass could be watched since he is asymptomatic , has patent stent and given his age   Recent EGD [facility] with gastritis and diverticulitis on omeprazole bid    ROS: Denies fever, malais, weight loss, blurry vision, decreased visual acuity, cough, sputum, SOB, hemoptysis, pleuritic pain, palpitaitons, heartburn, abdominal pain, melena, lower extremity edema, claudication, or rash.  All other systems reviewed and negative  General:  BP 563 systolic  On left and 875 systolic on right  Affect appropriate Healthy:  appears stated age 81: normal Neck supple with no adenopathy JVP normal bilateral  bruits no thyromegaly Lungs clear with no wheezing and good diaphragmatic motion Heart:  S1/S2 no murmur, no rub, gallop or click PMI normal  Right carotid subclavian bypass  Abdomen: benighn, BS positve, no tenderness, no AAA no bruit.  No HSM or HJR Distal pulses intact with no bruits No edema Neuro non-focal Skin warm and dry No muscular weakness   Current Outpatient Prescriptions  Medication Sig Dispense Refill  . ALPRAZolam (XANAX) 0.25 MG tablet TAKE ONE TABLET BY MOUTH AT BEDTIME AS NEEDED (Patient taking differently: Take 0.125 mg by mouth at bedtime as needed for sleep. TAKE ONE TABLET BY MOUTH AT BEDTIME AS NEEDED) 30 tablet 3  . aspirin 325 MG  EC tablet Take 325 mg by mouth every morning.     . Cholecalciferol (VITAMIN D-3 PO) Take 2,000 mg by mouth every morning.     . fluticasone (FLONASE) 50 MCG/ACT nasal spray Place 2 sprays into both nostrils daily. (Patient taking differently: Place 2 sprays into both nostrils daily as needed for allergies. ) 16 g 6  . losartan (COZAAR) 50 MG tablet Take 1 tablet (50 mg total) by mouth every morning. (Patient not taking: Reported on 07/10/2014) 90 tablet 3  . lovastatin (MEVACOR) 40 MG tablet Take 1 tablet (40 mg total) by mouth every morning. 90 tablet 3  . metoprolol tartrate (LOPRESSOR) 25 MG tablet Take 1 tablet (25 mg total) by mouth once. 1/2 TAB BID 90 tablet 3  . Multiple Vitamin (MULTIVITAMIN) tablet Take 1 tablet by mouth every morning.     [name] NITROSTAT 0.4 MG SL tablet PLACE 1 TABLET (0.4 MG TOTAL) UNDER THE TONGUE EVERY 5 (FIVE) MINUTES AS NEEDED FOR CHEST PAIN. 25 tablet 1  . Omega-3 Fatty Acids (FISH OIL) 1000 MG CAPS Take 2 capsules by mouth every morning.     [name] omeprazole (PRILOSEC) 20 MG capsule 1 PO 30 mins prior to breakfast and supper 180 capsule 3  . polyethylene glycol-electrolytes (TRILYTE) 420 G solution Take 4,000 mLs by mouth as directed. 4000 mL 0  . Probiotic Product (ALIGN) 4 MG CAPS Take 4 mg by mouth daily as needed.      No current facility-administered medications for this visit.    Allergies  Doxycycline; Contrast  media; Eszopiclone; Lunesta; and Morphine and related  Electrocardiogram:   11/30/12  SR rate 60 LAE otherwise normal  05/15/14  SR ate 47  Normal ECG   Assessment and Plan CAD:  Distant CABG stable no chest pain   Bradycardia.:  Decrease metoprolol to 1/2 tab daily f/u with me 6-8 weeks  PVD:  F/U Dr Scot Dock does not feel that intervention warranted despite high velocities and VBS HTN:  Well controlled should follow BP in left arm which is higher  GI:  Improved with recent diagnosis of gastritis and diverticulitis  Omeprazole bid   [name]

## 2014-07-18 ENCOUNTER — Encounter: Payer: Commercial Managed Care - HMO | Admitting: Cardiovascular Disease

## 2014-07-25 ENCOUNTER — Other Ambulatory Visit: Payer: Self-pay | Admitting: Family Medicine

## 2014-07-25 NOTE — Telephone Encounter (Signed)
Refill called to pharmacy.

## 2014-07-25 NOTE — Telephone Encounter (Signed)
Last seen 05/27/14  Dr Laurance Flatten  If approved route to nurse to call into [facility]

## 2014-07-25 NOTE — Telephone Encounter (Signed)
Please call in xanax with 1 refills 

## 2014-09-30 ENCOUNTER — Other Ambulatory Visit (INDEPENDENT_AMBULATORY_CARE_PROVIDER_SITE_OTHER): Payer: Commercial Managed Care - HMO

## 2014-09-30 DIAGNOSIS — I25810 Atherosclerosis of coronary artery bypass graft(s) without angina pectoris: Secondary | ICD-10-CM | POA: Diagnosis not present

## 2014-09-30 DIAGNOSIS — E7800 Pure hypercholesterolemia, unspecified: Secondary | ICD-10-CM

## 2014-09-30 DIAGNOSIS — D473 Essential (hemorrhagic) thrombocythemia: Secondary | ICD-10-CM

## 2014-09-30 DIAGNOSIS — D75839 Thrombocytosis, unspecified: Secondary | ICD-10-CM

## 2014-09-30 DIAGNOSIS — E559 Vitamin D deficiency, unspecified: Secondary | ICD-10-CM | POA: Diagnosis not present

## 2014-09-30 DIAGNOSIS — I700 Atherosclerosis of aorta: Secondary | ICD-10-CM | POA: Diagnosis not present

## 2014-09-30 DIAGNOSIS — N400 Enlarged prostate without lower urinary tract symptoms: Secondary | ICD-10-CM

## 2014-09-30 NOTE — Progress Notes (Signed)
Lab only 

## 2014-10-01 LAB — HEPATIC FUNCTION PANEL
ALBUMIN: 4.2 g/dL (ref 3.5–4.7)
ALT: 12 IU/L (ref 0–44)
AST: 18 IU/L (ref 0–40)
Alkaline Phosphatase: 66 IU/L (ref 39–117)
Bilirubin Total: 0.3 mg/dL (ref 0.0–1.2)
Bilirubin, Direct: 0.15 mg/dL (ref 0.00–0.40)
TOTAL PROTEIN: 6.5 g/dL (ref 6.0–8.5)

## 2014-10-01 LAB — CBC WITH DIFFERENTIAL/PLATELET
BASOS: 1 %
Basophils Absolute: 0.1 10*3/uL (ref 0.0–0.2)
EOS (ABSOLUTE): 0.2 10*3/uL (ref 0.0–0.4)
Eos: 3 %
HEMOGLOBIN: 15.2 g/dL (ref 12.6–17.7)
Hematocrit: 44.2 % (ref 37.5–51.0)
IMMATURE GRANS (ABS): 0 10*3/uL (ref 0.0–0.1)
IMMATURE GRANULOCYTES: 0 %
LYMPHS: 32 %
Lymphocytes Absolute: 1.8 10*3/uL (ref 0.7–3.1)
MCH: 31.4 pg (ref 26.6–33.0)
MCHC: 34.4 g/dL (ref 31.5–35.7)
MCV: 91 fL (ref 79–97)
MONOCYTES: 14 %
Monocytes Absolute: 0.8 10*3/uL (ref 0.1–0.9)
NEUTROS ABS: 2.9 10*3/uL (ref 1.4–7.0)
NEUTROS PCT: 50 %
PLATELETS: 243 10*3/uL (ref 150–379)
RBC: 4.84 x10E6/uL (ref 4.14–5.80)
RDW: 15.6 % — ABNORMAL HIGH (ref 12.3–15.4)
WBC: 5.7 10*3/uL (ref 3.4–10.8)

## 2014-10-01 LAB — NMR, LIPOPROFILE
CHOLESTEROL: 210 mg/dL — AB (ref 100–199)
HDL CHOLESTEROL BY NMR: 88 mg/dL (ref >39)
HDL PARTICLE NUMBER: 37.5 umol/L (ref >=30.5)
LDL PARTICLE NUMBER: 1058 nmol/L — AB (ref <1000)
LDL Size: 21.2 nm (ref >20.5)
LDL-C: 110 mg/dL — AB (ref 0–99)
Small LDL Particle Number: <90 nmol/L (ref <=527)
TRIGLYCERIDES BY NMR: 58 mg/dL (ref 0–149)

## 2014-10-01 LAB — BMP8+EGFR
BUN / CREAT RATIO: 11 (ref 10–22)
BUN: 9 mg/dL (ref 8–27)
CO2: 25 mmol/L (ref 18–29)
Calcium: 9.6 mg/dL (ref 8.6–10.2)
Chloride: 95 mmol/L — ABNORMAL LOW (ref 97–108)
Creatinine, Ser: 0.85 mg/dL (ref 0.76–1.27)
GFR calc Af Amer: 94 mL/min/{1.73_m2} (ref >59)
GFR, EST NON AFRICAN AMERICAN: 82 mL/min/{1.73_m2} (ref >59)
GLUCOSE: 82 mg/dL (ref 65–99)
Potassium: 5.1 mmol/L (ref 3.5–5.2)
SODIUM: 135 mmol/L (ref 134–144)

## 2014-10-01 LAB — VITAMIN D 25 HYDROXY (VIT D DEFICIENCY, FRACTURES): Vit D, 25-Hydroxy: 76.9 ng/mL (ref 30.0–100.0)

## 2014-10-06 ENCOUNTER — Ambulatory Visit (INDEPENDENT_AMBULATORY_CARE_PROVIDER_SITE_OTHER): Payer: Commercial Managed Care - HMO | Admitting: Family Medicine

## 2014-10-06 ENCOUNTER — Telehealth: Payer: Self-pay | Admitting: Cardiovascular Disease

## 2014-10-06 ENCOUNTER — Encounter: Payer: Self-pay | Admitting: Family Medicine

## 2014-10-06 VITALS — BP 125/62 | HR 50 | Temp 97.7°F | Ht 74.0 in | Wt 166.0 lb

## 2014-10-06 DIAGNOSIS — N400 Enlarged prostate without lower urinary tract symptoms: Secondary | ICD-10-CM | POA: Diagnosis not present

## 2014-10-06 DIAGNOSIS — E7800 Pure hypercholesterolemia, unspecified: Secondary | ICD-10-CM

## 2014-10-06 DIAGNOSIS — I10 Essential (primary) hypertension: Secondary | ICD-10-CM | POA: Diagnosis not present

## 2014-10-06 DIAGNOSIS — N508 Other specified disorders of male genital organs: Secondary | ICD-10-CM

## 2014-10-06 DIAGNOSIS — I739 Peripheral vascular disease, unspecified: Secondary | ICD-10-CM | POA: Diagnosis not present

## 2014-10-06 DIAGNOSIS — I700 Atherosclerosis of aorta: Secondary | ICD-10-CM | POA: Diagnosis not present

## 2014-10-06 DIAGNOSIS — N5089 Other specified disorders of the male genital organs: Secondary | ICD-10-CM

## 2014-10-06 DIAGNOSIS — I499 Cardiac arrhythmia, unspecified: Secondary | ICD-10-CM | POA: Diagnosis not present

## 2014-10-06 DIAGNOSIS — I6521 Occlusion and stenosis of right carotid artery: Secondary | ICD-10-CM | POA: Diagnosis not present

## 2014-10-06 DIAGNOSIS — D473 Essential (hemorrhagic) thrombocythemia: Secondary | ICD-10-CM | POA: Diagnosis not present

## 2014-10-06 DIAGNOSIS — I25810 Atherosclerosis of coronary artery bypass graft(s) without angina pectoris: Secondary | ICD-10-CM

## 2014-10-06 DIAGNOSIS — D75839 Thrombocytosis, unspecified: Secondary | ICD-10-CM

## 2014-10-06 NOTE — Patient Instructions (Addendum)
Medicare Annual Wellness Visit  [facility] and the medical providers at [facility] strive to bring you the best medical care.  In doing so we not only want to address your current medical conditions and concerns but also to detect new conditions early and prevent illness, disease and health-related problems.    Medicare offers a yearly Wellness Visit which allows our clinical staff to assess your need for preventative services including immunizations, lifestyle education, counseling to decrease risk of preventable diseases and screening for fall risk and other medical concerns.    This visit is provided free of charge (no copay) for all Medicare recipients. The clinical pharmacists at [facility] have begun to conduct these Wellness Visits which will also include a thorough review of all your medications.    As you primary medical provider recommend that you make an appointment for your Annual Wellness Visit if you have not done so already this year.  You may set up this appointment before you leave today or you may call back ([phone]) and schedule an appointment.  Please make sure when you call that you mention that you are scheduling your Annual Wellness Visit with the clinical pharmacist so that the appointment may be made for the proper length of time.     Continue current medications. Continue good therapeutic lifestyle changes which include good diet and exercise. Fall precautions discussed with patient. If an FOBT was given today- please return it to our front desk. If you are over 5 years old - you may need Prevnar 34 or the adult Pneumonia vaccine.  **Flu shots will be available soon--- please call and schedule a FLU-CLINIC appointment**  After your visit with [place] today you will receive a survey in the mail or online from [employer] regarding your care with [place]. Please take a moment to fill this out. Your feedback is  very important to [place] as you can help [place] better understand your patient needs as well as improve your experience and satisfaction. WE CARE ABOUT YOU!!!   **Please join [place] SEPT.22, 2016 from 5:00 to 7:00pm for our OPEN HOUSE! Come out and meet our NEW providers** We will arrange for the patient to have an ultrasound of the scrotum to evaluate this lump that has become more sensitive over time. He will follow-up with cardiology as planned and get Dopplers of lower extremity and carotids as planned

## 2014-10-06 NOTE — Telephone Encounter (Signed)
Dr. Tawanna Sat office called asking to speak with Dr. Johnsie Cancel regarding the new onset of an irregular heartbeat.  Dr. Johnsie Cancel said he tried to return Dr. Tawanna Sat call twice and didn't get to speak with him. Dr. Johnsie Cancel staff messaged Danny [name] with the following directives:  "needs echo and 48 hr holter this week f/u with me next available"  Danny [name] will get this arranged and call the patient with the appointment information.

## 2014-10-06 NOTE — Progress Notes (Signed)
Subjective:    Patient ID: Danny Loron., male    DOB: Feb 05, 1931, 81 y.o.   MRN: [id number]  HPI Pt here for follow up and management of chronic medical problems which includes hypertension and hyperlipidemia. He is taking medications regularly. The patient has a history of thoracic aortic atherosclerosis and an abdominal aortic aneurysm. He also has carotid bruits. He has had recent lab work and this will be discussed with him and reviewed with him during the visit today.The blood sugar is good at 82. The creatinine, the most important kidney function test is within normal limits. The electrolytes including potassium are good except the chloride is slightly decreased. All liver function tests are within normal limits Cholesterol numbers with advanced lipid testing have a total LDL particle number that is slightly elevated at 1058. LDL C is elevated at 110. The triglycerides are good. The HDL particle number or the good cholesterol is good. She should continue to take his Fish oil and lovastatin and practice as aggressive therapeutic lifestyle changes as possible which include diet and exercise The vitamin D level is good at 76.9 he should continue with current treatment The CBC has a normal white blood cell count. The hemoglobin is good at 15.2 and the platelet count is adequate The patient today does complain of a knot in the right testicle area. He denies chest pain or shortness of breath. He admits to not being as active this summer and that most likely has played a role with his cholesterol numbers going up. He is started walking more. He is not having any trouble with heartburn or indigestion now. He did see the gastroenterologist and had an endoscopy and colonoscopy and was started on omeprazole and a probiotic and this has helped his heartburn and has helped his stools to normalize. He is not seeing any blood in the stool and he is passing his water without problems. He does have an upcoming  appointment for carotid Dopplers and lower extremity Dopplers planned by the cardiologist. He sees a cardiologist about every 6 months.      Patient Active Problem List   Diagnosis Date Noted  . Hematest positive stools   . Dysphagia, pharyngoesophageal phase   . Heme + stool 02/27/2014  . Constipation 02/27/2014  . Dysphagia 02/27/2014  . Thoracic aorta atherosclerosis 01/14/2014  . 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 Bilateral carotid bruits 05/27/2013  . Enteritis due to Clostridium difficile, history of 11/21/2012  . AAA (abdominal aortic aneurysm) without rupture 05/18/2010  . Hyperplasia of prostate 05/18/2010  . Carotid stenosis 05/18/2010  . CAROTID BRUIT 01/06/2010  . HYPERCHOLESTEROLEMIA  IIA 05/19/2008  . SYNCOPE-CAROTID SINUS 05/19/2008  . BLURRED VISION 05/19/2008  . HYPERTENSION, UNSPECIFIED 05/19/2008  . CAD, ARTERY BYPASS GRAFT 05/19/2008  . SUBCLAVIAN STEAL SYNDROME 05/19/2008   Outpatient Encounter Prescriptions as of 10/06/2014  Medication Sig  . ALPRAZolam (XANAX) 0.25 MG tablet TAKE ONE TABLET BY MOUTH AT BEDTIME AS NEEDED  . aspirin 325 MG EC tablet Take 325 mg by mouth every morning.   . Cholecalciferol (VITAMIN D-3 PO) Take 2,000 mg by mouth every morning.   . fluticasone (FLONASE) 50 MCG/ACT nasal spray Place 2 sprays into both nostrils daily. (Patient taking differently: Place 2 sprays into both nostrils daily as needed for allergies. )  . lovastatin (MEVACOR) 40 MG tablet Take 1 tablet (40 mg total) by mouth every morning.  . metoprolol tartrate (LOPRESSOR) 25 MG tablet  Take 1 tablet (25 mg total) by mouth once. 1/2 TAB BID  . Multiple Vitamin (MULTIVITAMIN) tablet Take 1 tablet by mouth every morning.   [name] NITROSTAT 0.4 MG SL tablet PLACE 1 TABLET (0.4 MG TOTAL) UNDER THE TONGUE EVERY 5 (FIVE) MINUTES AS NEEDED FOR CHEST PAIN.  [name] Omega-3 Fatty Acids (FISH OIL) 1000 MG CAPS Take 2 capsules by mouth every morning.   [name]  omeprazole (PRILOSEC) 20 MG capsule 1 PO 30 mins prior to breakfast and supper  . polyethylene glycol-electrolytes (TRILYTE) 420 G solution Take 4,000 mLs by mouth as directed.  . Probiotic Product (ALIGN) 4 MG CAPS Take 4 mg by mouth daily as needed.   [name] losartan (COZAAR) 50 MG tablet Take 1 tablet (50 mg total) by mouth every morning. (Patient not taking: Reported on 07/10/2014)   No facility-administered encounter medications on file as of 10/06/2014.       Review of Systems  Constitutional: Negative.   HENT: Negative.   Eyes: Negative.   Respiratory: Negative.   Cardiovascular: Negative.   Gastrointestinal: Negative.   Endocrine: Negative.   Genitourinary: Positive for discharge.       Non-painful knot right testicle  Musculoskeletal: Negative.   Skin: Negative.   Allergic/Immunologic: Negative.   Neurological: Negative.   Hematological: Negative.   Psychiatric/Behavioral: Negative.        Objective:   Physical Exam  Constitutional: He is oriented to person, place, and time. He appears well-developed and well-nourished. No distress.  HENT:  Head: Normocephalic and atraumatic.  Right Ear: External ear normal.  Left Ear: External ear normal.  Nose: Nose normal.  Mouth/Throat: Oropharynx is clear and moist. No oropharyngeal exudate.  Eyes: Conjunctivae and EOM are normal. Pupils are equal, round, and reactive to light. Right eye exhibits no discharge. Left eye exhibits no discharge. No scleral icterus.  Neck: Normal range of motion. Neck supple. No thyromegaly present.  The patient has both a supraclavicular bruit and a right carotid bruit on the right side. He has a left supraclavicular bruit.  Cardiovascular: Normal rate and normal heart sounds.   No murmur heard. The patient's heart rhythm is irregular today at about 60/m. It is hard to palpate any pulses on the right foot or in the right groin. He has palpable pulses in the left groin and foot.  Pulmonary/Chest: Effort  normal and breath sounds normal. No respiratory distress. He has no wheezes. He has no rales. He exhibits no tenderness.  Clear anteriorly and posteriorly  Abdominal: Soft. Bowel sounds are normal. He exhibits no mass. There is no tenderness. There is no rebound and no guarding.  The abdomen was nontender without masses or organ enlargement  Genitourinary: Penis normal.  The right scrotal lump appears to be supratesticular in nature. There were no hernias on either side.  Musculoskeletal: Normal range of motion. He exhibits no edema.  Lymphadenopathy:    He has no cervical adenopathy.  Neurological: He is alert and oriented to person, place, and time. He has normal reflexes. No cranial nerve deficit.  Skin: Skin is warm and dry. No rash noted.  Psychiatric: He has a normal mood and affect. His behavior is normal. Judgment and thought content normal.  Nursing note and vitals reviewed.  BP 125/62 mmHg  Pulse 50  Temp(Src) 97.7 F (36.5 C) (Oral)  Ht 6\' 2"  (1.88 m)  Wt 166 lb (75.297 kg)  BMI 21.30 kg/m2  EKG: The EKG had a bigeminy rhythm at a rate of 58.  This will be discussed with his cardiologist today.       Assessment & Plan:  1. HYPERCHOLESTEROLEMIA  IIA -The patient's cholesterol is slightly elevated today and he attributes this to his lack of activity because of the heat of the summer. He is increasing his activity level to now and we will continue to watch this and hope that these numbers improved by the next visit. He will not change his medication.  2. Atherosclerosis of coronary artery bypass graft of native heart without angina pectoris -He continues to follow-up regularly with the cardiologist every 6 months.  3. Thoracic aorta atherosclerosis -He continues to watch his diet and stays active and takes his medicine for cholesterol  4. Thrombocytosis -EC and platelet count were normal.  5. BPH (benign prostatic hyperplasia) -He is having no problems passing his  water.  6. Peripheral vascular insufficiency -He has diminished pulses in the right foot and he is planning on getting both carotid Dopplers and lower extremity Dopplers soon at the cardiologist's request.  7. Carotid artery stenosis, right -And 10 you follow-up with cardiology  8. Essential hypertension -Continue current medication  9. Testicular lump -This has become more sensitive even though it has been there for a good while - US Scrotum; Future  10. Irregular heart beat -Follow-up with cardiology - EKG 12-Lead  No orders of the defined types were placed in this encounter.   Patient Instructions                       Medicare Annual Wellness Visit  [facility] and the medical providers at [facility] strive to bring you the best medical care.  In doing so we not only want to address your current medical conditions and concerns but also to detect new conditions early and prevent illness, disease and health-related problems.    Medicare offers a yearly Wellness Visit which allows our clinical staff to assess your need for preventative services including immunizations, lifestyle education, counseling to decrease risk of preventable diseases and screening for fall risk and other medical concerns.    This visit is provided free of charge (no copay) for all Medicare recipients. The clinical pharmacists at [facility] have begun to conduct these Wellness Visits which will also include a thorough review of all your medications.    As you primary medical provider recommend that you make an appointment for your Annual Wellness Visit if you have not done so already this year.  You may set up this appointment before you leave today or you may call back [phone]) and schedule an appointment.  Please make sure when you call that you mention that you are scheduling your Annual Wellness Visit with the clinical pharmacist so that the appointment may be  made for the proper length of time.     Continue current medications. Continue good therapeutic lifestyle changes which include good diet and exercise. Fall precautions discussed with patient. If an FOBT was given today- please return it to our front desk. If you are over 74 years old - you may need Prevnar 90 or the adult Pneumonia vaccine.  **Flu shots will be available soon--- please call and schedule a FLU-CLINIC appointment**  After your visit with [place] today you will receive a survey in the mail or online from [employer] regarding your care with [place]. Please take a moment to fill this out. Your feedback is very important to [place] as you can  help [place] better understand your patient needs as well as improve your experience and satisfaction. WE CARE ABOUT YOU!!!   **Please join [place] SEPT.[email or web address], 2016 from 5:00 to 7:00pm for our OPEN HOUSE! Come out and meet our NEW providers** We will arrange for the patient to have an ultrasound of the scrotum to evaluate this lump that has become more sensitive over time. He will follow-up with cardiology as planned and get Dopplers of lower extremity and carotids as planned   Arrie Senate MD

## 2014-10-10 ENCOUNTER — Ambulatory Visit (HOSPITAL_COMMUNITY)
Admission: RE | Admit: 2014-10-10 | Discharge: 2014-10-10 | Disposition: A | Discharge status: Home / Self Care | Payer: Commercial Managed Care - HMO | Source: Ambulatory Visit | Attending: Cardiovascular Disease | Admitting: Cardiovascular Disease

## 2014-10-10 ENCOUNTER — Other Ambulatory Visit: Payer: Self-pay | Admitting: Family Medicine

## 2014-10-10 DIAGNOSIS — I499 Cardiac arrhythmia, unspecified: Secondary | ICD-10-CM | POA: Insufficient documentation

## 2014-10-10 DIAGNOSIS — N5089 Other specified disorders of the male genital organs: Secondary | ICD-10-CM

## 2014-10-13 ENCOUNTER — Ambulatory Visit (HOSPITAL_COMMUNITY)
Admission: RE | Admit: 2014-10-13 | Discharge: 2014-10-13 | Disposition: A | Discharge status: Home / Self Care | Payer: Commercial Managed Care - HMO | Source: Ambulatory Visit | Attending: Cardiovascular Disease | Admitting: Cardiovascular Disease

## 2014-10-13 ENCOUNTER — Ambulatory Visit (HOSPITAL_COMMUNITY)
Admission: RE | Admit: 2014-10-13 | Discharge: 2014-10-13 | Disposition: A | Discharge status: Home / Self Care | Payer: Commercial Managed Care - HMO | Source: Ambulatory Visit | Attending: Family Medicine | Admitting: Family Medicine

## 2014-10-13 DIAGNOSIS — N508 Other specified disorders of male genital organs: Secondary | ICD-10-CM | POA: Diagnosis present

## 2014-10-13 DIAGNOSIS — N5089 Other specified disorders of the male genital organs: Secondary | ICD-10-CM

## 2014-10-13 DIAGNOSIS — N433 Hydrocele, unspecified: Secondary | ICD-10-CM | POA: Insufficient documentation

## 2014-10-13 DIAGNOSIS — I499 Cardiac arrhythmia, unspecified: Secondary | ICD-10-CM | POA: Diagnosis not present

## 2014-10-13 DIAGNOSIS — I861 Scrotal varices: Secondary | ICD-10-CM | POA: Insufficient documentation

## 2014-10-20 ENCOUNTER — Other Ambulatory Visit: Payer: Self-pay | Admitting: Nurse Practitioner

## 2014-10-21 NOTE — Telephone Encounter (Signed)
Last filled  09/17/14, last seen 10/06/14. Call in at [facility]

## 2014-11-02 NOTE — Progress Notes (Signed)
Patient ID: Danny [name]., male   DOB: Jan 17, 1932, 81 y.o.   MRN: [id number] Danny [name] is seen today for distant CABG, 2003 carotid disease with left CEA and right subclavian bypass. in 2009 He has HTN and elevated lipids. last visit he had had a syncopal episode. There was no cardiac etiology found. He has great exercise tolerance with no angina. He has improved strength in his right arm and no TIA symptoms. He has residual right carotid disease.  Compliant with meds   Duplex 03/2014 with elevated proximal anastomotic velocity from subclavian to CCA bypass 4.22m/sec 23-55% RICA 73-22% LICA  Seen by Dr Scot Dock who did not think any intervention needed and also noted patent distal subclavian stent  Recent EGD [facility] with gastritis and diverticulitis on omeprazole bid   Primary concerned about irregular heart rate.  Known PVC;s F/U event monitor with no NSVT  F/U echo with  10/10/14 reviewed EF normal 55-60%  Mild MR  His ARB and beta blocker have been decreased in past for dizzyness    ROS: Denies fever, malais, weight loss, blurry vision, decreased visual acuity, cough, sputum, SOB, hemoptysis, pleuritic pain, palpitaitons, heartburn, abdominal pain, melena, lower extremity edema, claudication, or rash.  All other systems reviewed and negative  General:  BP 025 systolic  On left and 427 systolic on right  Affect appropriate Healthy:  appears stated age 81: normal Neck supple with no adenopathy JVP normal bilateral  bruits no thyromegaly Lungs clear with no wheezing and good diaphragmatic motion Heart:  S1/S2 SEM murmur, no rub, gallop or click PMI normal  Right carotid subclavian bypass  Abdomen: benighn, BS positve, no tenderness, no AAA no bruit.  No HSM or HJR Distal pulses intact with no bruits No edema Neuro non-focal Skin warm and dry No muscular weakness   Current Outpatient Prescriptions  Medication Sig Dispense Refill  . ALPRAZolam (XANAX) 0.25 MG tablet TAKE ONE  TABLET BY MOUTH AT BEDTIME AS NEEDED 30 tablet 2  . aspirin 325 MG EC tablet Take 325 mg by mouth every morning.     . Cholecalciferol (VITAMIN D-3 PO) Take 2,000 mg by mouth every morning.     . fluticasone (FLONASE) 50 MCG/ACT nasal spray Place 2 sprays into both nostrils daily. (Patient taking differently: Place 2 sprays into both nostrils daily as needed for allergies. ) 16 g 6  . losartan (COZAAR) 50 MG tablet Take 1 tablet (50 mg total) by mouth every morning. 90 tablet 3  . lovastatin (MEVACOR) 40 MG tablet Take 1 tablet (40 mg total) by mouth every morning. 90 tablet 3  . metoprolol tartrate (LOPRESSOR) 25 MG tablet Take 1 tablet (25 mg total) by mouth once. 1/2 TAB BID 90 tablet 3  . Multiple Vitamin (MULTIVITAMIN) tablet Take 1 tablet by mouth every morning.     [name] NITROSTAT 0.4 MG SL tablet PLACE 1 TABLET (0.4 MG TOTAL) UNDER THE TONGUE EVERY 5 (FIVE) MINUTES AS NEEDED FOR CHEST PAIN. 25 tablet 1  . Omega-3 Fatty Acids (FISH OIL) 1000 MG CAPS Take 2 capsules by mouth every morning.     [name] omeprazole (PRILOSEC) 20 MG capsule 1 PO 30 mins prior to breakfast and supper 180 capsule 3  . polyethylene glycol-electrolytes (TRILYTE) 420 G solution Take 4,000 mLs by mouth as directed. 4000 mL 0  . Probiotic Product (ALIGN) 4 MG CAPS Take 4 mg by mouth daily as needed.      No current facility-administered medications for this visit.  Allergies  Doxycycline; Contrast media; Eszopiclone; Lunesta; and Morphine and related  Electrocardiogram:   11/30/12  SR rate 60 LAE otherwise normal  05/15/14  SR ate 47  Normal ECG   Assessment and Plan CAD:  Distant CABG stable no chest pain   Bradycardia.:  Decrease metoprolol to 1/2 tab daily f/u with me 6-8 weeks  PVD:  Refer back to [facility]  Having postural dizzyness twice / day with possible stenosis in carotid limb of right bypass and VB steal HTN:  Well controlled should follow BP in left arm which is higher  GI:  Improved with recent diagnosis of  gastritis and diverticulitis  Omeprazole bid   [name]

## 2014-11-03 ENCOUNTER — Ambulatory Visit (INDEPENDENT_AMBULATORY_CARE_PROVIDER_SITE_OTHER): Payer: Commercial Managed Care - HMO | Admitting: Cardiovascular Disease

## 2014-11-03 ENCOUNTER — Encounter: Payer: Self-pay | Admitting: Cardiovascular Disease

## 2014-11-03 VITALS — BP 126/66 | HR 49 | Ht 74.0 in | Wt 165.0 lb

## 2014-11-03 DIAGNOSIS — I2583 Coronary atherosclerosis due to lipid rich plaque: Principal | ICD-10-CM

## 2014-11-03 DIAGNOSIS — I2510 Atherosclerotic heart disease of native coronary artery without angina pectoris: Secondary | ICD-10-CM | POA: Diagnosis not present

## 2014-11-03 NOTE — Patient Instructions (Signed)
Your physician wants you to follow-up in: 6 months with Dr Johnsie Cancel in [place] or [place] will receive a reminder letter in the mail two months in advance. If you don't receive a letter, please call our office to schedule the follow-up appointment.      Your physician recommends that you continue on your current medications as directed. Please refer to the Current Medication list given to you today.      Thank you for choosing [facility] !

## 2014-11-05 ENCOUNTER — Ambulatory Visit (INDEPENDENT_AMBULATORY_CARE_PROVIDER_SITE_OTHER): Payer: Commercial Managed Care - HMO

## 2014-11-05 DIAGNOSIS — Z23 Encounter for immunization: Secondary | ICD-10-CM | POA: Diagnosis not present

## 2014-11-18 ENCOUNTER — Telehealth: Payer: Self-pay | Admitting: Family Medicine

## 2014-11-18 DIAGNOSIS — H269 Unspecified cataract: Secondary | ICD-10-CM

## 2014-11-18 DIAGNOSIS — H2513 Age-related nuclear cataract, bilateral: Secondary | ICD-10-CM | POA: Diagnosis not present

## 2014-11-18 DIAGNOSIS — Z0100 Encounter for examination of eyes and vision without abnormal findings: Secondary | ICD-10-CM | POA: Diagnosis not present

## 2014-11-26 DIAGNOSIS — H353132 Nonexudative age-related macular degeneration, bilateral, intermediate dry stage: Secondary | ICD-10-CM | POA: Diagnosis not present

## 2014-11-26 DIAGNOSIS — H4321 Crystalline deposits in vitreous body, right eye: Secondary | ICD-10-CM | POA: Diagnosis not present

## 2014-11-26 DIAGNOSIS — H25813 Combined forms of age-related cataract, bilateral: Secondary | ICD-10-CM | POA: Diagnosis not present

## 2014-11-27 DIAGNOSIS — H353132 Nonexudative age-related macular degeneration, bilateral, intermediate dry stage: Secondary | ICD-10-CM | POA: Diagnosis not present

## 2014-12-15 DIAGNOSIS — H25812 Combined forms of age-related cataract, left eye: Secondary | ICD-10-CM | POA: Diagnosis not present

## 2014-12-15 DIAGNOSIS — H2512 Age-related nuclear cataract, left eye: Secondary | ICD-10-CM | POA: Diagnosis not present

## 2014-12-29 DIAGNOSIS — H2511 Age-related nuclear cataract, right eye: Secondary | ICD-10-CM | POA: Diagnosis not present

## 2014-12-29 DIAGNOSIS — H25811 Combined forms of age-related cataract, right eye: Secondary | ICD-10-CM | POA: Diagnosis not present

## 2015-02-02 ENCOUNTER — Other Ambulatory Visit: Payer: Self-pay | Admitting: Family Medicine

## 2015-02-03 NOTE — Telephone Encounter (Signed)
Last seen 10/06/14  DW[facility]  If approved route to nurse to call into [facility]

## 2015-02-04 ENCOUNTER — Other Ambulatory Visit (INDEPENDENT_AMBULATORY_CARE_PROVIDER_SITE_OTHER): Payer: Commercial Managed Care - HMO

## 2015-02-04 DIAGNOSIS — E7800 Pure hypercholesterolemia, unspecified: Secondary | ICD-10-CM

## 2015-02-04 DIAGNOSIS — N400 Enlarged prostate without lower urinary tract symptoms: Secondary | ICD-10-CM

## 2015-02-04 DIAGNOSIS — I25810 Atherosclerosis of coronary artery bypass graft(s) without angina pectoris: Secondary | ICD-10-CM | POA: Diagnosis not present

## 2015-02-04 DIAGNOSIS — E559 Vitamin D deficiency, unspecified: Secondary | ICD-10-CM

## 2015-02-04 DIAGNOSIS — I10 Essential (primary) hypertension: Secondary | ICD-10-CM

## 2015-02-04 DIAGNOSIS — I700 Atherosclerosis of aorta: Secondary | ICD-10-CM

## 2015-02-04 NOTE — Progress Notes (Signed)
Lab only 

## 2015-02-05 LAB — BMP8+EGFR
BUN / CREAT RATIO: 10 (ref 10–22)
BUN: 9 mg/dL (ref 8–27)
CALCIUM: 9.2 mg/dL (ref 8.6–10.2)
CO2: 26 mmol/L (ref 18–29)
Chloride: 95 mmol/L — ABNORMAL LOW (ref 96–106)
Creatinine, Ser: 0.91 mg/dL (ref 0.76–1.27)
GFR calc non Af Amer: 79 mL/min/{1.73_m2} (ref >59)
GFR, EST AFRICAN AMERICAN: 91 mL/min/{1.73_m2} (ref >59)
Glucose: 92 mg/dL (ref 65–99)
Potassium: 4.2 mmol/L (ref 3.5–5.2)
Sodium: 136 mmol/L (ref 134–144)

## 2015-02-05 LAB — CBC WITH DIFFERENTIAL/PLATELET
BASOS ABS: 0.1 10*3/uL (ref 0.0–0.2)
Basos: 2 %
EOS (ABSOLUTE): 0.2 10*3/uL (ref 0.0–0.4)
EOS: 3 %
HEMATOCRIT: 45.9 % (ref 37.5–51.0)
HEMOGLOBIN: 15.3 g/dL (ref 12.6–17.7)
IMMATURE GRANS (ABS): 0 10*3/uL (ref 0.0–0.1)
Immature Granulocytes: 0 %
LYMPHS ABS: 1.4 10*3/uL (ref 0.7–3.1)
LYMPHS: 28 %
MCH: 31.2 pg (ref 26.6–33.0)
MCHC: 33.3 g/dL (ref 31.5–35.7)
MCV: 94 fL (ref 79–97)
MONOCYTES: 13 %
Monocytes Absolute: 0.7 10*3/uL (ref 0.1–0.9)
NEUTROS ABS: 2.8 10*3/uL (ref 1.4–7.0)
Neutrophils: 54 %
Platelets: 268 10*3/uL (ref 150–379)
RBC: 4.9 x10E6/uL (ref 4.14–5.80)
RDW: 14.9 % (ref 12.3–15.4)
WBC: 5.2 10*3/uL (ref 3.4–10.8)

## 2015-02-05 LAB — VITAMIN D 25 HYDROXY (VIT D DEFICIENCY, FRACTURES): VIT D 25 HYDROXY: 85.7 ng/mL (ref 30.0–100.0)

## 2015-02-05 LAB — NMR, LIPOPROFILE
CHOLESTEROL: 203 mg/dL — AB (ref 100–199)
HDL Cholesterol by NMR: 87 mg/dL (ref >39)
HDL PARTICLE NUMBER: 39.6 umol/L (ref >=30.5)
LDL PARTICLE NUMBER: 877 nmol/L (ref <1000)
LDL SIZE: 21.2 nm (ref >20.5)
LDL-C: 104 mg/dL — AB (ref 0–99)
Small LDL Particle Number: <90 nmol/L (ref <=527)
Triglycerides by NMR: 60 mg/dL (ref 0–149)

## 2015-02-05 LAB — HEPATIC FUNCTION PANEL
ALBUMIN: 4.1 g/dL (ref 3.5–4.7)
ALK PHOS: 63 IU/L (ref 39–117)
ALT: 15 IU/L (ref 0–44)
AST: 19 IU/L (ref 0–40)
BILIRUBIN, DIRECT: 0.14 mg/dL (ref 0.00–0.40)
Bilirubin Total: 0.4 mg/dL (ref 0.0–1.2)
TOTAL PROTEIN: 6.5 g/dL (ref 6.0–8.5)

## 2015-02-11 ENCOUNTER — Ambulatory Visit (INDEPENDENT_AMBULATORY_CARE_PROVIDER_SITE_OTHER): Payer: Commercial Managed Care - HMO | Admitting: Family Medicine

## 2015-02-11 ENCOUNTER — Encounter: Payer: Self-pay | Admitting: Family Medicine

## 2015-02-11 VITALS — BP 133/69 | HR 60 | Temp 97.4°F | Ht 74.0 in | Wt 162.0 lb

## 2015-02-11 DIAGNOSIS — I25810 Atherosclerosis of coronary artery bypass graft(s) without angina pectoris: Secondary | ICD-10-CM

## 2015-02-11 DIAGNOSIS — F411 Generalized anxiety disorder: Secondary | ICD-10-CM

## 2015-02-11 DIAGNOSIS — R0989 Other specified symptoms and signs involving the circulatory and respiratory systems: Secondary | ICD-10-CM | POA: Diagnosis not present

## 2015-02-11 DIAGNOSIS — I10 Essential (primary) hypertension: Secondary | ICD-10-CM | POA: Diagnosis not present

## 2015-02-11 DIAGNOSIS — N400 Enlarged prostate without lower urinary tract symptoms: Secondary | ICD-10-CM

## 2015-02-11 DIAGNOSIS — D75839 Thrombocytosis, unspecified: Secondary | ICD-10-CM

## 2015-02-11 DIAGNOSIS — I714 Abdominal aortic aneurysm, without rupture, unspecified: Secondary | ICD-10-CM

## 2015-02-11 DIAGNOSIS — I700 Atherosclerosis of aorta: Secondary | ICD-10-CM

## 2015-02-11 DIAGNOSIS — E7800 Pure hypercholesterolemia, unspecified: Secondary | ICD-10-CM

## 2015-02-11 DIAGNOSIS — D473 Essential (hemorrhagic) thrombocythemia: Secondary | ICD-10-CM | POA: Diagnosis not present

## 2015-02-11 LAB — POCT URINALYSIS DIPSTICK
BILIRUBIN UA: NEGATIVE
Blood, UA: NEGATIVE
GLUCOSE UA: NEGATIVE
KETONES UA: NEGATIVE
LEUKOCYTES UA: NEGATIVE
Nitrite, UA: NEGATIVE
Protein, UA: NEGATIVE
Spec Grav, UA: 1.01
UROBILINOGEN UA: NEGATIVE
pH, UA: 7

## 2015-02-11 LAB — POCT UA - MICROSCOPIC ONLY
BACTERIA, U MICROSCOPIC: NEGATIVE
CASTS, UR, LPF, POC: NEGATIVE
CRYSTALS, UR, HPF, POC: NEGATIVE
EPITHELIAL CELLS, URINE PER MICROSCOPY: NEGATIVE
MUCUS UA: NEGATIVE
RBC, urine, microscopic: NEGATIVE
WBC, Ur, HPF, POC: NEGATIVE
Yeast, UA: NEGATIVE

## 2015-02-11 MED ORDER — ALPRAZOLAM 0.25 MG PO TABS: 0.2500 mg | ORAL_TABLET | Freq: Every evening | ORAL | Status: DC | PRN

## 2015-02-11 NOTE — Patient Instructions (Addendum)
Medicare Annual Wellness Visit  [facility] and the medical providers at [facility] strive to bring you the best medical care.  In doing so we not only want to address your current medical conditions and concerns but also to detect new conditions early and prevent illness, disease and health-related problems.    Medicare offers a yearly Wellness Visit which allows our clinical staff to assess your need for preventative services including immunizations, lifestyle education, counseling to decrease risk of preventable diseases and screening for fall risk and other medical concerns.    This visit is provided free of charge (no copay) for all Medicare recipients. The clinical pharmacists at [facility] have begun to conduct these Wellness Visits which will also include a thorough review of all your medications.    As you primary medical provider recommend that you make an appointment for your Annual Wellness Visit if you have not done so already this year.  You may set up this appointment before you leave today or you may call back [phone] and schedule an appointment.  Please make sure when you call that you mention that you are scheduling your Annual Wellness Visit with the clinical pharmacist so that the appointment may be made for the proper length of time.     Continue current medications. Continue good therapeutic lifestyle changes which include good diet and exercise. Fall precautions discussed with patient. If an FOBT was given today- please return it to our front desk. If you are over 68 years old - you may need Prevnar 32 or the adult Pneumonia vaccine.  **Flu shots are available--- please call and schedule a FLU-CLINIC appointment**  After your visit with [place] today you will receive a survey in the mail or online from [employer] regarding your care with [place]. Please take a moment to fill this out. Your feedback is very  important to [place] as you can help [place] better understand your patient needs as well as improve your experience and satisfaction. WE CARE ABOUT YOU!!!   Continue to follow-up with cardiology and vascular surgeon because of right carotid bruit and subclavian steal syndrome Stay well hydrated and keep the house as cool as possible Continue to exercise and walk regularly but the same time be careful with climbing as to reduce the risk of falling Continue with current medications  Follow-up with ophthalmology as planned and make sure they sent [place] a record of your visits there Return the FOBT

## 2015-02-11 NOTE — Progress Notes (Signed)
Subjective:    Patient ID: Danny Loron., male    DOB: March 09, 1931, 82 y.o.   MRN: [id number]  HPI Pt here for follow up and management of chronic medical problems which includes hypertension. He is taking medications regularly. The patient is doing well overall with no specific complaints. He is requesting a refill on his Xanax. He has had recent lab work done and this will be reviewed with him during the visit. All of his cholesterol numbers by advanced lipid testing were good with a total LDL particle number being 877. The LDL C was slightly elevated. Triglycerides were good and the good cholesterol or the HDL particle number was also good. The creatinine was good and the blood sugar was 92. The CBC had a normal white blood cell count with a good hemoglobin at 15.3 and the platelet count was adequate. All liver function tests were normal. Vitamin D level was excellent at 85.7. Patient denies chest pain shortness of breath trouble swallowing heartburn indigestion nausea vomiting diarrhea or blood in the stool. He does have nocturia a couple times he says his stream is slow. He walks a mile a day and he understands to avoid habits of climbing that might put himself at risk for falling. He had no questions about the lab work that was reviewed with him. He is due to return an FOBT. He has had cataract surgery on both eyes in December and says he is thrilled with the vision he has now.      Patient Active Problem List   Diagnosis Date Noted  . Hematest positive stools   . Dysphagia, pharyngoesophageal phase   . Heme + stool 02/27/2014  . Constipation 02/27/2014  . Dysphagia 02/27/2014  . Thoracic aorta atherosclerosis ([place]) 01/14/2014  . 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 Bilateral carotid bruits 05/27/2013  . Enteritis due to Clostridium difficile, history of 11/21/2012  . AAA (abdominal aortic aneurysm) without rupture ([place]) 05/18/2010  . Hyperplasia of  prostate 05/18/2010  . Carotid stenosis 05/18/2010  . CAROTID BRUIT 01/06/2010  . HYPERCHOLESTEROLEMIA  IIA 05/19/2008  . SYNCOPE-CAROTID SINUS 05/19/2008  . BLURRED VISION 05/19/2008  . HYPERTENSION, UNSPECIFIED 05/19/2008  . CAD, ARTERY BYPASS GRAFT 05/19/2008  . SUBCLAVIAN STEAL SYNDROME 05/19/2008   Outpatient Encounter Prescriptions as of 02/11/2015  Medication Sig  . ALPRAZolam (XANAX) 0.25 MG tablet TAKE ONE TABLET BY MOUTH AT BEDTIME AS NEEDED  . aspirin 325 MG EC tablet Take 325 mg by mouth every morning.   . Cholecalciferol (VITAMIN D-3 PO) Take 2,000 mg by mouth every morning.   . fluticasone (FLONASE) 50 MCG/ACT nasal spray Place 2 sprays into both nostrils daily. (Patient taking differently: Place 2 sprays into both nostrils daily as needed for allergies. )  . losartan (COZAAR) 50 MG tablet Take 1 tablet (50 mg total) by mouth every morning.  . lovastatin (MEVACOR) 40 MG tablet Take 1 tablet (40 mg total) by mouth every morning.  . metoprolol tartrate (LOPRESSOR) 25 MG tablet Take 1 tablet (25 mg total) by mouth once. 1/2 TAB BID  . Multiple Vitamin (MULTIVITAMIN) tablet Take 1 tablet by mouth every morning.   [name] NITROSTAT 0.4 MG SL tablet PLACE 1 TABLET (0.4 MG TOTAL) UNDER THE TONGUE EVERY 5 (FIVE) MINUTES AS NEEDED FOR CHEST PAIN.  [name] Omega-3 Fatty Acids (FISH OIL) 1000 MG CAPS Take 2 capsules by mouth every morning.   [name] omeprazole (PRILOSEC) 20 MG capsule 1 PO  30 mins prior to breakfast and supper  . polyethylene glycol-electrolytes (TRILYTE) 420 G solution Take 4,000 mLs by mouth as directed.  . Probiotic Product (ALIGN) 4 MG CAPS Take 4 mg by mouth daily as needed.    No facility-administered encounter medications on file as of 02/11/2015.      Review of Systems  Constitutional: Negative.   HENT: Negative.   Eyes: Negative.   Respiratory: Negative.   Cardiovascular: Negative.   Gastrointestinal: Negative.   Endocrine: Negative.   Genitourinary: Negative.     Musculoskeletal: Negative.   Skin: Negative.   Allergic/Immunologic: Negative.   Neurological: Negative.   Hematological: Negative.   Psychiatric/Behavioral: Negative.        Objective:   Physical Exam  Constitutional: He is oriented to person, place, and time. He appears well-developed and well-nourished. No distress.  Small framed but alert and active physically  HENT:  Head: Normocephalic and atraumatic.  Right Ear: External ear normal.  Left Ear: External ear normal.  Nose: Nose normal.  Mouth/Throat: Oropharynx is clear and moist. No oropharyngeal exudate.  Eyes: Conjunctivae and EOM are normal. Pupils are equal, round, and reactive to light. Right eye exhibits no discharge. Left eye exhibits no discharge. No scleral icterus.  Neck: Normal range of motion. Neck supple. No thyromegaly present.  Right carotid and supraclavicular bruit  Cardiovascular: Normal rate and normal heart sounds.   No murmur heard. The pulses in the right wrist were diminished secondary to stenosis of the right supraclavicular artery and his subclavian steal syndrome .pulses were palpable in the lower extremities. The heart was slightly irregular at 72/m  Pulmonary/Chest: Effort normal and breath sounds normal. No respiratory distress. He has no wheezes. He has no rales. He exhibits no tenderness.  Clear anteriorly and posteriorly  Abdominal: Soft. Bowel sounds are normal. He exhibits no mass. There is no tenderness. There is no rebound and no guarding.  No liver or spleen enlargement and no tenderness. No inguinal adenopathy  Genitourinary: Rectum normal and penis normal.  The prostate is enlarged without lumps or masses. The rectal exam was clear of masses. There were no inguinal hernias palpable. The external genitalia were within normal limits.  Musculoskeletal: Normal range of motion. He exhibits no edema or tenderness.  Lymphadenopathy:    He has no cervical adenopathy.  Neurological: He is alert  and oriented to person, place, and time. He has normal reflexes. No cranial nerve deficit.  Skin: Skin is warm and dry. No rash noted.  Psychiatric: He has a normal mood and affect. His behavior is normal. Judgment and thought content normal.  Nursing note and vitals reviewed.    BP 133/69 mmHg  Pulse 60  Temp(Src) 97.4 F (36.3 C) (Oral)  Ht 6\' 2"  (1.88 m)  Wt 162 lb (73.483 kg)  BMI 20.79 kg/m2      Assessment & Plan:  1. BPH (benign prostatic hyperplasia) -The patient has nocturia but no other BPH symptoms - POCT urinalysis dipstick - POCT UA - Microscopic Only  2. Thoracic aorta atherosclerosis ([place]) -He is followed regularly by the vascular surgeon and cardiologist  3. Atherosclerosis of coronary artery bypass graft of native heart without angina pectoris -Continue  follow-up with cardiology  4. HYPERCHOLESTEROLEMIA  IIA -Continue current cholesterol treatment  5. Essential hypertension -Blood pressure is good today and he will continue with current treatment  6. Thrombocytosis (HCC) -Platelet count was adequate  7. AAA (abdominal aortic aneurysm) without rupture ([place]) -Continue follow-up with vascular surgeon  8. Bilateral carotid bruits -Continue follow-up with vascular surgeon and cardiology  9. Hyperplasia of prostate -No treatment will continue to do rectal exams  10. Generalized anxiety disorder -Continue with Xanax as needed  Meds ordered this encounter  Medications  . DISCONTD: ALPRAZolam (XANAX) 0.25 MG tablet    Sig: Take 1 tablet (0.25 mg total) by mouth at bedtime as needed.    Dispense:  30 tablet    Refill:  1  . ALPRAZolam (XANAX) 0.25 MG tablet    Sig: Take 1 tablet (0.25 mg total) by mouth at bedtime as needed.    Dispense:  30 tablet    Refill:  1   Patient Instructions                       Medicare Annual Wellness Visit  [facility] and the medical providers at [facility] strive to bring you the best  medical care.  In doing so we not only want to address your current medical conditions and concerns but also to detect new conditions early and prevent illness, disease and health-related problems.    Medicare offers a yearly Wellness Visit which allows our clinical staff to assess your need for preventative services including immunizations, lifestyle education, counseling to decrease risk of preventable diseases and screening for fall risk and other medical concerns.    This visit is provided free of charge (no copay) for all Medicare recipients. The clinical pharmacists at [facility] have begun to conduct these Wellness Visits which will also include a thorough review of all your medications.    As you primary medical provider recommend that you make an appointment for your Annual Wellness Visit if you have not done so already this year.  You may set up this appointment before you leave today or you may call back [phone] and schedule an appointment.  Please make sure when you call that you mention that you are scheduling your Annual Wellness Visit with the clinical pharmacist so that the appointment may be made for the proper length of time.     Continue current medications. Continue good therapeutic lifestyle changes which include good diet and exercise. Fall precautions discussed with patient. If an FOBT was given today- please return it to our front desk. If you are over 31 years old - you may need Prevnar 41 or the adult Pneumonia vaccine.  **Flu shots are available--- please call and schedule a FLU-CLINIC appointment**  After your visit with [place] today you will receive a survey in the mail or online from [employer] regarding your care with [place]. Please take a moment to fill this out. Your feedback is very important to [place] as you can help [place] better understand your patient needs as well as improve your experience and satisfaction. WE CARE ABOUT YOU!!!   Continue to  follow-up with cardiology and vascular surgeon because of right carotid bruit and subclavian steal syndrome Stay well hydrated and keep the house as cool as possible Continue to exercise and walk regularly but the same time be careful with climbing as to reduce the risk of falling Continue with current medications  Follow-up with ophthalmology as planned and make sure they sent [place] a record of your visits there Return the FOBT   Arrie Senate MD

## 2015-04-14 ENCOUNTER — Other Ambulatory Visit: Payer: Self-pay | Admitting: Cardiovascular Disease

## 2015-06-17 ENCOUNTER — Other Ambulatory Visit: Payer: Self-pay | Admitting: Family Medicine

## 2015-06-24 ENCOUNTER — Ambulatory Visit (INDEPENDENT_AMBULATORY_CARE_PROVIDER_SITE_OTHER): Payer: Commercial Managed Care - HMO | Admitting: Family Medicine

## 2015-06-24 ENCOUNTER — Encounter: Payer: Self-pay | Admitting: Family Medicine

## 2015-06-24 VITALS — BP 128/68 | HR 56 | Temp 97.2°F | Ht 74.0 in | Wt 165.0 lb

## 2015-06-24 DIAGNOSIS — R0989 Other specified symptoms and signs involving the circulatory and respiratory systems: Secondary | ICD-10-CM

## 2015-06-24 DIAGNOSIS — Z1211 Encounter for screening for malignant neoplasm of colon: Secondary | ICD-10-CM

## 2015-06-24 DIAGNOSIS — I25810 Atherosclerosis of coronary artery bypass graft(s) without angina pectoris: Secondary | ICD-10-CM | POA: Diagnosis not present

## 2015-06-24 DIAGNOSIS — I714 Abdominal aortic aneurysm, without rupture, unspecified: Secondary | ICD-10-CM

## 2015-06-24 DIAGNOSIS — E7800 Pure hypercholesterolemia, unspecified: Secondary | ICD-10-CM

## 2015-06-24 DIAGNOSIS — I739 Peripheral vascular disease, unspecified: Secondary | ICD-10-CM | POA: Diagnosis not present

## 2015-06-24 DIAGNOSIS — N400 Enlarged prostate without lower urinary tract symptoms: Secondary | ICD-10-CM | POA: Diagnosis not present

## 2015-06-24 DIAGNOSIS — I499 Cardiac arrhythmia, unspecified: Secondary | ICD-10-CM

## 2015-06-24 DIAGNOSIS — I700 Atherosclerosis of aorta: Secondary | ICD-10-CM

## 2015-06-24 DIAGNOSIS — I10 Essential (primary) hypertension: Secondary | ICD-10-CM | POA: Diagnosis not present

## 2015-06-24 DIAGNOSIS — E559 Vitamin D deficiency, unspecified: Secondary | ICD-10-CM

## 2015-06-24 MED ORDER — ALPRAZOLAM 0.25 MG PO TABS: 0.2500 mg | ORAL_TABLET | Freq: Every evening | ORAL | Status: DC | PRN

## 2015-06-24 NOTE — Progress Notes (Signed)
Subjective:    Patient ID: Danny [name]., male    DOB: 08-Jan-1932, 82 y.o.   MRN: [id number]  HPI Pt here for follow up and management of chronic medical problems which includes hypertension and hyperlipidemia. He is taking medications regularly.She is doing well overall and has no specific complaints. He is asking for refill on his Xanax. He sees his cardiologist in June and also has an eye appointment at that time too. He will get lab work today or he will return fasting to get this. He will be given an FOBT to return. The patient denies any chest pain or chest tightness and has no more shortness of breath than usual. He stays active physically. He has no trouble with swallowing heartburn indigestion nausea vomiting diarrhea or blood in the stool. He has no black tarry bowel movements. He is passing his water without problems. He does get periodic studies of his carotid arteries.     Patient Active Problem List   Diagnosis Date Noted  . Hematest positive stools   . Dysphagia, pharyngoesophageal phase   . Heme + stool 02/27/2014  . Constipation 02/27/2014  . Dysphagia 02/27/2014  . Thoracic aorta atherosclerosis ([place]) 01/14/2014  . 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 Bilateral carotid bruits 05/27/2013  . Enteritis due to Clostridium difficile, history of 11/21/2012  . AAA (abdominal aortic aneurysm) without rupture ([place]) 05/18/2010  . Hyperplasia of prostate 05/18/2010  . Carotid stenosis 05/18/2010  . CAROTID BRUIT 01/06/2010  . HYPERCHOLESTEROLEMIA  IIA 05/19/2008  . SYNCOPE-CAROTID SINUS 05/19/2008  . BLURRED VISION 05/19/2008  . Essential hypertension 05/19/2008  . CAD, ARTERY BYPASS GRAFT 05/19/2008  . SUBCLAVIAN STEAL SYNDROME 05/19/2008   Outpatient Encounter Prescriptions as of 06/24/2015  Medication Sig  . ALPRAZolam (XANAX) 0.25 MG tablet Take 1 tablet (0.25 mg total) by mouth at bedtime as needed.  [name] aspirin 325 MG EC tablet Take 325 mg  by mouth every morning.   . Cholecalciferol (VITAMIN D-3 PO) Take 2,000 mg by mouth every morning.   . fluticasone (FLONASE) 50 MCG/ACT nasal spray Place 2 sprays into both nostrils daily. (Patient taking differently: Place 2 sprays into both nostrils daily as needed for allergies. )  . losartan (COZAAR) 50 MG tablet Take 1 tablet (50 mg total) by mouth every morning.  . lovastatin (MEVACOR) 40 MG tablet TAKE 1 TABLET (40 MG TOTAL) BY MOUTH EVERY MORNING.  . metoprolol tartrate (LOPRESSOR) 25 MG tablet TAKE 1/2 TABLET TWICE DAILY  . Multiple Vitamin (MULTIVITAMIN) tablet Take 1 tablet by mouth every morning.   [name] NITROSTAT 0.4 MG SL tablet PLACE 1 TABLET (0.4 MG TOTAL) UNDER THE TONGUE EVERY 5 (FIVE) MINUTES AS NEEDED FOR CHEST PAIN.  [name] Omega-3 Fatty Acids (FISH OIL) 1000 MG CAPS Take 2 capsules by mouth every morning.   [name] omeprazole (PRILOSEC) 20 MG capsule 1 PO 30 mins prior to breakfast and supper  . polyethylene glycol-electrolytes (TRILYTE) 420 G solution Take 4,000 mLs by mouth as directed.  . Probiotic Product (ALIGN) 4 MG CAPS Take 4 mg by mouth daily as needed.   . [DISCONTINUED] ALPRAZolam (XANAX) 0.25 MG tablet Take 1 tablet (0.25 mg total) by mouth at bedtime as needed.   No facility-administered encounter medications on file as of 06/24/2015.      Review of Systems  Constitutional: Negative.   HENT: Negative.   Eyes: Negative.   Respiratory: Negative.   Cardiovascular: Negative.   Gastrointestinal: Negative.  Endocrine: Negative.   Genitourinary: Negative.   Musculoskeletal: Negative.   Skin: Negative.   Allergic/Immunologic: Negative.   Neurological: Negative.   Hematological: Negative.   Psychiatric/Behavioral: Negative.        Objective:   Physical Exam  Constitutional: He is oriented to person, place, and time. He appears well-developed and well-nourished.  HENT:  Head: Normocephalic and atraumatic.  Right Ear: External ear normal.  Left Ear: External ear  normal.  Nose: Nose normal.  Mouth/Throat: Oropharynx is clear and moist. No oropharyngeal exudate.  Eyes: Conjunctivae and EOM are normal. Pupils are equal, round, and reactive to light. Right eye exhibits no discharge. Left eye exhibits no discharge. No scleral icterus.  The patient had bilateral cataract surgery in December and plans to go for a revisit soon with his ophthalmologist. He is seeing much better.  Neck: Normal range of motion. Neck supple. No thyromegaly present.  The patient has a right carotid and right supraclavicular bruit. There is a left supraclavicular bruit. The radial pulses are good bilaterally.  Cardiovascular: Normal heart sounds.  Exam reveals no gallop and no friction rub.   No murmur heard. Pedal pulses were difficult to palpate. Inguinal pulses were palpable. The heart rate was 48-56/m and slightly irregular.  Pulmonary/Chest: Effort normal and breath sounds normal. No respiratory distress. He has no wheezes. He has no rales. He exhibits no tenderness.  Abdominal: Soft. Bowel sounds are normal. He exhibits no mass. There is no tenderness. There is no rebound and no guarding.  Musculoskeletal: Normal range of motion. He exhibits no edema or tenderness.  Lymphadenopathy:    He has no cervical adenopathy.  Neurological: He is alert and oriented to person, place, and time. He has normal reflexes. No cranial nerve deficit.  Skin: Skin is warm and dry. No rash noted.  Psychiatric: He has a normal mood and affect. His behavior is normal. Judgment and thought content normal.  Nursing note and vitals reviewed.  BP 128/68 mmHg  Pulse 56  Temp(Src) 97.2 F (36.2 C) (Oral)  Ht 6' 2"  (1.88 m)  Wt 165 lb (74.844 kg)  BMI 21.18 kg/m2  EKG: Frequent ectopic PVCs with a rate at 54/m.      Assessment & Plan:  1. BPH (benign prostatic hyperplasia) -Patient is having no symptoms with passing his water. - CBC with Differential/Platelet; Future  2. Thoracic aorta  atherosclerosis ([place]) -He will continue with aggressive therapeutic lifestyle changes and cholesterol treatment pending results of lab work - CBC with Differential/Platelet; Future  3. Atherosclerosis of coronary artery bypass graft of native heart without angina pectoris -Follow-up with cardiology as planned - CBC with Differential/Platelet; Future  4. HYPERCHOLESTEROLEMIA  IIA -Continue with current cholesterol medicine and aggressive therapeutic lifestyle changes - CBC with Differential/Platelet; Future - Hepatic function panel; Future - NMR, lipoprofile; Future  5. Essential hypertension -The blood pressure is good today and he will continue with current treatment. - BMP8+EGFR; Future - CBC with Differential/Platelet; Future - Hepatic function panel; Future  6. Special screening for malignant neoplasms, colon - Fecal occult blood, imunochemical; Future  7. Vitamin D deficiency -Continue with current treatment pending results of lab work - VITAMIN D 25 Hydroxy (Vit-D Deficiency, Fractures); Future  8. Bilateral carotid bruits -Follow-up with carotid Dopplers as planned by cardiology  9. Irregular heartbeat -Follow-up with cardiology as planned -Try to reduce caffeine intake - EKG 12-Lead  10. Peripheral vascular insufficiency (HCC) -Pedal pulses were difficult to palpate with the patient is having no  symptoms with claudication.  Meds ordered this encounter  Medications  . ALPRAZolam (XANAX) 0.25 MG tablet    Sig: Take 1 tablet (0.25 mg total) by mouth at bedtime as needed.    Dispense:  30 tablet    Refill:  3   Patient Instructions                       Medicare Annual Wellness Visit  [facility] and the medical providers at [facility] strive to bring you the best medical care.  In doing so we not only want to address your current medical conditions and concerns but also to detect new conditions early and prevent illness, disease and  health-related problems.    Medicare offers a yearly Wellness Visit which allows our clinical staff to assess your need for preventative services including immunizations, lifestyle education, counseling to decrease risk of preventable diseases and screening for fall risk and other medical concerns.    This visit is provided free of charge (no copay) for all Medicare recipients. The clinical pharmacists at [facility] have begun to conduct these Wellness Visits which will also include a thorough review of all your medications.    As you primary medical provider recommend that you make an appointment for your Annual Wellness Visit if you have not done so already this year.  You may set up this appointment before you leave today or you may call back ([phone]) and schedule an appointment.  Please make sure when you call that you mention that you are scheduling your Annual Wellness Visit with the clinical pharmacist so that the appointment may be made for the proper length of time.     Continue current medications. Continue good therapeutic lifestyle changes which include good diet and exercise. Fall precautions discussed with patient. If an FOBT was given today- please return it to our front desk. If you are over 60 years old - you may need Prevnar 47 or the adult Pneumonia vaccine.  **Flu shots are available--- please call and schedule a FLU-CLINIC appointment**  After your visit with [place] today you will receive a survey in the mail or online from [employer] regarding your care with [place]. Please take a moment to fill this out. Your feedback is very important to [place] as you can help [place] better understand your patient needs as well as improve your experience and satisfaction. WE CARE ABOUT YOU!!!   Don't work in the heat of the day Drink plenty of fluids and stay well hydrated Keep appointment with cardiology and repeat carotid Dopplers as recommended by cardiology The patient will  return to the clinic for his fasting lab work. He will take a copy of the EKG with him when he goes to see the cardiologist   Watch caffeine intake more closely Arrie Senate MD

## 2015-06-24 NOTE — Patient Instructions (Addendum)
Medicare Annual Wellness Visit  [facility] and the medical providers at [facility] strive to bring you the best medical care.  In doing so we not only want to address your current medical conditions and concerns but also to detect new conditions early and prevent illness, disease and health-related problems.    Medicare offers a yearly Wellness Visit which allows our clinical staff to assess your need for preventative services including immunizations, lifestyle education, counseling to decrease risk of preventable diseases and screening for fall risk and other medical concerns.    This visit is provided free of charge (no copay) for all Medicare recipients. The clinical pharmacists at [facility] have begun to conduct these Wellness Visits which will also include a thorough review of all your medications.    As you primary medical provider recommend that you make an appointment for your Annual Wellness Visit if you have not done so already this year.  You may set up this appointment before you leave today or you may call back [phone] and schedule an appointment.  Please make sure when you call that you mention that you are scheduling your Annual Wellness Visit with the clinical pharmacist so that the appointment may be made for the proper length of time.     Continue current medications. Continue good therapeutic lifestyle changes which include good diet and exercise. Fall precautions discussed with patient. If an FOBT was given today- please return it to our front desk. If you are over 24 years old - you may need Prevnar 65 or the adult Pneumonia vaccine.  **Flu shots are available--- please call and schedule a FLU-CLINIC appointment**  After your visit with [place] today you will receive a survey in the mail or online from [employer] regarding your care with [place]. Please take a moment to fill this out. Your feedback is very  important to [place] as you can help [place] better understand your patient needs as well as improve your experience and satisfaction. WE CARE ABOUT YOU!!!   Don't work in the heat of the day Drink plenty of fluids and stay well hydrated Keep appointment with cardiology and repeat carotid Dopplers as recommended by cardiology The patient will return to the clinic for his fasting lab work. He will take a copy of the EKG with him when he goes to see the cardiologist
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
## 2015-07-08 ENCOUNTER — Encounter: Payer: Self-pay | Admitting: Cardiovascular Disease

## 2015-07-08 ENCOUNTER — Ambulatory Visit (INDEPENDENT_AMBULATORY_CARE_PROVIDER_SITE_OTHER): Payer: Commercial Managed Care - HMO | Admitting: Cardiovascular Disease

## 2015-07-08 VITALS — BP 124/70 | HR 75 | Ht 74.0 in | Wt 163.8 lb

## 2015-07-08 DIAGNOSIS — I2510 Atherosclerotic heart disease of native coronary artery without angina pectoris: Secondary | ICD-10-CM | POA: Diagnosis not present

## 2015-07-08 DIAGNOSIS — I2583 Coronary atherosclerosis due to lipid rich plaque: Principal | ICD-10-CM

## 2015-07-08 NOTE — Patient Instructions (Signed)
Your physician wants you to follow-up in: 6 Months with Dr. Nishan. You will receive a reminder letter in the mail two months in advance. If you don't receive a letter, please call our office to schedule the follow-up appointment.  Your physician recommends that you continue on your current medications as directed. Please refer to the Current Medication list given to you today.  If you need a refill on your cardiac medications before your next appointment, please call your pharmacy.  Thank you for choosing [facility]!   

## 2015-07-16 ENCOUNTER — Other Ambulatory Visit: Payer: Commercial Managed Care - HMO

## 2015-07-16 DIAGNOSIS — I700 Atherosclerosis of aorta: Secondary | ICD-10-CM

## 2015-07-16 DIAGNOSIS — I10 Essential (primary) hypertension: Secondary | ICD-10-CM | POA: Diagnosis not present

## 2015-07-16 DIAGNOSIS — I25810 Atherosclerosis of coronary artery bypass graft(s) without angina pectoris: Secondary | ICD-10-CM | POA: Diagnosis not present

## 2015-07-16 DIAGNOSIS — E559 Vitamin D deficiency, unspecified: Secondary | ICD-10-CM

## 2015-07-16 DIAGNOSIS — E7800 Pure hypercholesterolemia, unspecified: Secondary | ICD-10-CM

## 2015-07-16 DIAGNOSIS — N400 Enlarged prostate without lower urinary tract symptoms: Secondary | ICD-10-CM

## 2015-07-17 LAB — CBC WITH DIFFERENTIAL/PLATELET
BASOS: 1 %
Basophils Absolute: 0 10*3/uL (ref 0.0–0.2)
EOS (ABSOLUTE): 0.1 10*3/uL (ref 0.0–0.4)
EOS: 2 %
HEMATOCRIT: 43.8 % (ref 37.5–51.0)
Hemoglobin: 14.4 g/dL (ref 12.6–17.7)
IMMATURE GRANS (ABS): 0 10*3/uL (ref 0.0–0.1)
IMMATURE GRANULOCYTES: 0 %
LYMPHS: 24 %
Lymphocytes Absolute: 1.1 10*3/uL (ref 0.7–3.1)
MCH: 31.6 pg (ref 26.6–33.0)
MCHC: 32.9 g/dL (ref 31.5–35.7)
MCV: 96 fL (ref 79–97)
Monocytes Absolute: 0.5 10*3/uL (ref 0.1–0.9)
Monocytes: 12 %
NEUTROS ABS: 2.8 10*3/uL (ref 1.4–7.0)
NEUTROS PCT: 61 %
Platelets: 315 10*3/uL (ref 150–379)
RBC: 4.55 x10E6/uL (ref 4.14–5.80)
RDW: 14.1 % (ref 12.3–15.4)
WBC: 4.6 10*3/uL (ref 3.4–10.8)

## 2015-07-17 LAB — BMP8+EGFR
BUN/Creatinine Ratio: 10 (ref 10–24)
BUN: 9 mg/dL (ref 8–27)
CALCIUM: 9.1 mg/dL (ref 8.6–10.2)
CO2: 25 mmol/L (ref 18–29)
CREATININE: 0.94 mg/dL (ref 0.76–1.27)
Chloride: 97 mmol/L (ref 96–106)
GFR, EST AFRICAN AMERICAN: 87 mL/min/{1.73_m2} (ref >59)
GFR, EST NON AFRICAN AMERICAN: 75 mL/min/{1.73_m2} (ref >59)
Glucose: 91 mg/dL (ref 65–99)
Potassium: 4.1 mmol/L (ref 3.5–5.2)
SODIUM: 137 mmol/L (ref 134–144)

## 2015-07-17 LAB — NMR, LIPOPROFILE
CHOLESTEROL: 162 mg/dL (ref 100–199)
HDL Cholesterol by NMR: 71 mg/dL (ref >39)
HDL Particle Number: 37.5 umol/L (ref >=30.5)
LDL Particle Number: 817 nmol/L (ref <1000)
LDL SIZE: 21.1 nm (ref >20.5)
LDL-C: 78 mg/dL (ref 0–99)
TRIGLYCERIDES BY NMR: 63 mg/dL (ref 0–149)

## 2015-07-17 LAB — HEPATIC FUNCTION PANEL
ALK PHOS: 77 IU/L (ref 39–117)
ALT: 17 IU/L (ref 0–44)
AST: 15 IU/L (ref 0–40)
Albumin: 4.1 g/dL (ref 3.5–4.7)
BILIRUBIN TOTAL: 0.5 mg/dL (ref 0.0–1.2)
BILIRUBIN, DIRECT: 0.17 mg/dL (ref 0.00–0.40)
Total Protein: 6.4 g/dL (ref 6.0–8.5)

## 2015-07-17 LAB — VITAMIN D 25 HYDROXY (VIT D DEFICIENCY, FRACTURES): Vit D, 25-Hydroxy: 76.8 ng/mL (ref 30.0–100.0)

## 2015-07-20 ENCOUNTER — Other Ambulatory Visit: Payer: Self-pay | Admitting: Family Medicine

## 2015-09-21 ENCOUNTER — Other Ambulatory Visit: Payer: Self-pay | Admitting: Family Medicine

## 2015-10-22 ENCOUNTER — Ambulatory Visit (INDEPENDENT_AMBULATORY_CARE_PROVIDER_SITE_OTHER): Payer: Commercial Managed Care - HMO

## 2015-10-22 DIAGNOSIS — Z23 Encounter for immunization: Secondary | ICD-10-CM | POA: Diagnosis not present

## 2015-10-29 ENCOUNTER — Other Ambulatory Visit (INDEPENDENT_AMBULATORY_CARE_PROVIDER_SITE_OTHER): Payer: Commercial Managed Care - HMO

## 2015-10-29 DIAGNOSIS — I10 Essential (primary) hypertension: Secondary | ICD-10-CM | POA: Diagnosis not present

## 2015-10-29 DIAGNOSIS — I25810 Atherosclerosis of coronary artery bypass graft(s) without angina pectoris: Secondary | ICD-10-CM | POA: Diagnosis not present

## 2015-10-29 DIAGNOSIS — E559 Vitamin D deficiency, unspecified: Secondary | ICD-10-CM

## 2015-10-29 DIAGNOSIS — I700 Atherosclerosis of aorta: Secondary | ICD-10-CM | POA: Diagnosis not present

## 2015-10-29 DIAGNOSIS — E7800 Pure hypercholesterolemia, unspecified: Secondary | ICD-10-CM | POA: Diagnosis not present

## 2015-10-30 LAB — BMP8+EGFR
BUN/Creatinine Ratio: 10 (ref 10–24)
BUN: 9 mg/dL (ref 8–27)
CALCIUM: 9.4 mg/dL (ref 8.6–10.2)
CO2: 22 mmol/L (ref 18–29)
CREATININE: 0.94 mg/dL (ref 0.76–1.27)
Chloride: 97 mmol/L (ref 96–106)
GFR calc Af Amer: 87 mL/min/{1.73_m2} (ref >59)
GFR calc non Af Amer: 75 mL/min/{1.73_m2} (ref >59)
Glucose: 91 mg/dL (ref 65–99)
POTASSIUM: 4.5 mmol/L (ref 3.5–5.2)
Sodium: 138 mmol/L (ref 134–144)

## 2015-10-30 LAB — CBC WITH DIFFERENTIAL/PLATELET
BASOS ABS: 0.1 10*3/uL (ref 0.0–0.2)
BASOS: 1 %
EOS (ABSOLUTE): 0.2 10*3/uL (ref 0.0–0.4)
Eos: 3 %
Hematocrit: 45.5 % (ref 37.5–51.0)
Hemoglobin: 14.8 g/dL (ref 12.6–17.7)
IMMATURE GRANS (ABS): 0 10*3/uL (ref 0.0–0.1)
IMMATURE GRANULOCYTES: 0 %
LYMPHS: 22 %
Lymphocytes Absolute: 1.1 10*3/uL (ref 0.7–3.1)
MCH: 30.1 pg (ref 26.6–33.0)
MCHC: 32.5 g/dL (ref 31.5–35.7)
MCV: 93 fL (ref 79–97)
MONOS ABS: 0.7 10*3/uL (ref 0.1–0.9)
Monocytes: 14 %
NEUTROS PCT: 60 %
Neutrophils Absolute: 3 10*3/uL (ref 1.4–7.0)
PLATELETS: 276 10*3/uL (ref 150–379)
RBC: 4.92 x10E6/uL (ref 4.14–5.80)
RDW: 14.8 % (ref 12.3–15.4)
WBC: 5 10*3/uL (ref 3.4–10.8)

## 2015-10-30 LAB — HEPATIC FUNCTION PANEL
ALBUMIN: 4.2 g/dL (ref 3.5–4.7)
ALT: 14 IU/L (ref 0–44)
AST: 20 IU/L (ref 0–40)
Alkaline Phosphatase: 79 IU/L (ref 39–117)
BILIRUBIN TOTAL: 0.5 mg/dL (ref 0.0–1.2)
BILIRUBIN, DIRECT: 0.17 mg/dL (ref 0.00–0.40)
TOTAL PROTEIN: 6.7 g/dL (ref 6.0–8.5)

## 2015-10-30 LAB — NMR, LIPOPROFILE
CHOLESTEROL: 183 mg/dL (ref 100–199)
HDL Cholesterol by NMR: 79 mg/dL (ref >39)
HDL Particle Number: 32.8 umol/L (ref >=30.5)
LDL PARTICLE NUMBER: 714 nmol/L (ref <1000)
LDL Size: 21 nm (ref >20.5)
LDL-C: 92 mg/dL (ref 0–99)
LP-IR SCORE: 32 (ref <=45)
Small LDL Particle Number: 188 nmol/L (ref <=527)
Triglycerides by NMR: 59 mg/dL (ref 0–149)

## 2015-10-30 LAB — VITAMIN D 25 HYDROXY (VIT D DEFICIENCY, FRACTURES): VIT D 25 HYDROXY: 99 ng/mL (ref 30.0–100.0)

## 2015-11-02 ENCOUNTER — Ambulatory Visit (INDEPENDENT_AMBULATORY_CARE_PROVIDER_SITE_OTHER): Payer: Commercial Managed Care - HMO | Admitting: Family Medicine

## 2015-11-02 ENCOUNTER — Encounter: Payer: Self-pay | Admitting: Family Medicine

## 2015-11-02 VITALS — BP 117/66 | HR 71 | Temp 97.7°F | Ht 74.0 in | Wt 169.0 lb

## 2015-11-02 DIAGNOSIS — I25810 Atherosclerosis of coronary artery bypass graft(s) without angina pectoris: Secondary | ICD-10-CM | POA: Diagnosis not present

## 2015-11-02 DIAGNOSIS — E7800 Pure hypercholesterolemia, unspecified: Secondary | ICD-10-CM | POA: Diagnosis not present

## 2015-11-02 DIAGNOSIS — E559 Vitamin D deficiency, unspecified: Secondary | ICD-10-CM

## 2015-11-02 DIAGNOSIS — I700 Atherosclerosis of aorta: Secondary | ICD-10-CM | POA: Diagnosis not present

## 2015-11-02 DIAGNOSIS — I10 Essential (primary) hypertension: Secondary | ICD-10-CM

## 2015-11-02 MED ORDER — ROSUVASTATIN CALCIUM 20 MG PO TABS: 20.0000 mg | ORAL_TABLET | Freq: Every day | ORAL | 3 refills | Status: DC

## 2015-11-02 NOTE — Patient Instructions (Addendum)
Medicare Annual Wellness Visit  [facility] and the medical providers at [facility] strive to bring you the best medical care.  In doing so we not only want to address your current medical conditions and concerns but also to detect new conditions early and prevent illness, disease and health-related problems.    Medicare offers a yearly Wellness Visit which allows our clinical staff to assess your need for preventative services including immunizations, lifestyle education, counseling to decrease risk of preventable diseases and screening for fall risk and other medical concerns.    This visit is provided free of charge (no copay) for all Medicare recipients. The clinical pharmacists at [facility] have begun to conduct these Wellness Visits which will also include a thorough review of all your medications.    As you primary medical provider recommend that you make an appointment for your Annual Wellness Visit if you have not done so already this year.  You may set up this appointment before you leave today or you may call back [phone] and schedule an appointment.  Please make sure when you call that you mention that you are scheduling your Annual Wellness Visit with the clinical pharmacist so that the appointment may be made for the proper length of time.  ,m  Continue current medications. Continue good therapeutic lifestyle changes which include good diet and exercise. Fall precautions discussed with patient. If an FOBT was given today- please return it to our front desk. If you are over 24 years old - you may need Prevnar 36 or the adult Pneumonia vaccine.  **Flu shots are available--- please call and schedule a FLU-CLINIC appointment**  After your visit with [place] today you will receive a survey in the mail or online from [employer] regarding your care with [place]. Please take a moment to fill this out. Your feedback is very  important to [place] as you can help [place] better understand your patient needs as well as improve your experience and satisfaction. WE CARE ABOUT YOU!!!   We will ask you to hold the lovastatin and try Crestor 20 mg generic 1 daily and come by the office in about 4 weeks and get a traditional lipid liver panel to see if this helps bring the LDL C down more. In conjunction with this the patient should try to get more exercise and continue to watch his diet closely He should follow-up with the cardiologist as planned every 6 months This winter he should drink plenty of fluids and stay well hydrated

## 2015-11-02 NOTE — Addendum Note (Signed)
Addended by: Zannie Cove on: 11/02/2015 11:58 AM   Modules accepted: Orders

## 2015-11-02 NOTE — Progress Notes (Signed)
Subjective:    Patient ID: Danny Loron., male    DOB: 10-01-1931, 82 y.o.   MRN: [id number]  HPI Pt here for follow up and management of chronic medical problems which includes hypertension and hyperlipidemia. He is taking medications regularly.The patient is doing well today and has no specific complaints. He has had his lab work done and we will review this with him during the visit. The LDL C is 92 and of course with having had a CABG done it should be less than 70 and we'll talk with him about this today. Triglycerides are good at 59. The CBC was normal with a good hemoglobin. All liver function tests were normal. The blood sugar and creatinine and electrolytes are within normal limits. The vitamin D level was 99 and we will reduce his vitamin D intake slightly. The patient denies any chest pain or shortness of breath anymore than usual. He has no trouble with his stomach including heartburn indigestion nausea vomiting diarrhea blood in the stool or black tarry bowel movements unless he does Pepto-Bismol. He's passing his water without problems. He is pleasant and alert and follows with a cardiologist regularly. He sees Dr. Johnsie Cancel.     Patient Active Problem List   Diagnosis Date Noted  . Hematest positive stools   . Dysphagia, pharyngoesophageal phase   . Heme + stool 02/27/2014  . Constipation 02/27/2014  . Dysphagia 02/27/2014  . Thoracic aorta atherosclerosis ([place]) 01/14/2014  . 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 Bilateral carotid bruits 05/27/2013  . Enteritis due to Clostridium difficile, history of 11/21/2012  . Hyperplasia of prostate 05/18/2010  . Carotid stenosis 05/18/2010  . CAROTID BRUIT 01/06/2010  . HYPERCHOLESTEROLEMIA  IIA 05/19/2008  . SYNCOPE-CAROTID SINUS 05/19/2008  . BLURRED VISION 05/19/2008  . Essential hypertension 05/19/2008  . CAD, ARTERY BYPASS GRAFT 05/19/2008  . 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 ALPRAZolam (XANAX) 0.25 MG tablet Take 1 tablet (0.25 mg total) by mouth at bedtime as needed.  [name] aspirin 325 MG EC tablet Take 325 mg by mouth every morning.   . Cholecalciferol (VITAMIN D-3 PO) Take 2,000 mg by mouth every morning.   . fluticasone (FLONASE) 50 MCG/ACT nasal spray Place 2 sprays into both nostrils daily. (Patient taking differently: Place 2 sprays into both nostrils daily as needed for allergies. )  . losartan (COZAAR) 50 MG tablet Take 1 tablet (50 mg total) by mouth every morning.  . lovastatin (MEVACOR) 40 MG tablet TAKE 1 TABLET (40 MG TOTAL) BY MOUTH EVERY MORNING.  . metoprolol tartrate (LOPRESSOR) 25 MG tablet TAKE 1/2 TABLET TWICE DAILY  . Multiple Vitamin (MULTIVITAMIN) tablet Take 1 tablet by mouth every morning.   [name] NITROSTAT 0.4 MG SL tablet PLACE 1 TABLET (0.4 MG TOTAL) UNDER THE TONGUE EVERY 5 (FIVE) MINUTES AS NEEDED FOR CHEST PAIN.  [name] Omega-3 Fatty Acids (FISH OIL) 1000 MG CAPS Take 2 capsules by mouth every morning.   [name] omeprazole (PRILOSEC) 20 MG capsule TAKE 1 CAPSULE TWICE DAILY 30 MINUTES PRIOR TO BREAKFAST AND SUPPER  . polyethylene glycol-electrolytes (TRILYTE) 420 G solution Take 4,000 mLs by mouth as directed.  . Probiotic Product (ALIGN) 4 MG CAPS Take 4 mg by mouth daily as needed.    No facility-administered encounter medications on file as of 11/02/2015.       Review of Systems  Constitutional: Negative.  HENT: Negative.   Eyes: Negative.   Respiratory: Negative.   Cardiovascular: Negative.   Gastrointestinal: Negative.   Endocrine: Negative.   Genitourinary: Negative.   Musculoskeletal: Negative.   Skin: Negative.   Allergic/Immunologic: Negative.   Neurological: Negative.   Hematological: Negative.   Psychiatric/Behavioral: Negative.        Objective:   Physical Exam  Constitutional: He is oriented to person, place, and time. He appears well-developed and well-nourished.  No distress.  HENT:  Head: Normocephalic and atraumatic.  Right Ear: External ear normal.  Left Ear: External ear normal.  Mouth/Throat: Oropharynx is clear and moist. No oropharyngeal exudate.  Slight nasal congestion on the left  Eyes: Conjunctivae and EOM are normal. Pupils are equal, round, and reactive to light. Right eye exhibits no discharge. Left eye exhibits no discharge. No scleral icterus.  Neck: Normal range of motion. Neck supple. No thyromegaly present.  The patient has a right supraclavicular bruit.  Cardiovascular: Normal rate, normal heart sounds and intact distal pulses.   No murmur heard. He has good radial pulses. Distal pulses were weaker on the right than the left The heart is slightly irregular at 72/m  Pulmonary/Chest: Effort normal and breath sounds normal. No respiratory distress. He has no wheezes. He has no rales. He exhibits no tenderness.  Abdominal: Soft. Bowel sounds are normal. He exhibits no mass. There is no tenderness. There is no rebound and no guarding.  Musculoskeletal: Normal range of motion. He exhibits no edema.  Lymphadenopathy:    He has no cervical adenopathy.  Neurological: He is alert and oriented to person, place, and time. He has normal reflexes. No cranial nerve deficit.  Skin: Skin is warm and dry. No rash noted.  Psychiatric: He has a normal mood and affect. His behavior is normal. Judgment and thought content normal.  Nursing note and vitals reviewed.  BP 117/66 (BP Location: Left Arm)   Pulse 71   Temp 97.7 F (36.5 C) (Oral)   Ht 6\' 2"  (1.88 m)   Wt 169 lb (76.7 kg)   BMI 21.70 kg/m         Assessment & Plan:  1. HYPERCHOLESTEROLEMIA  IIA -We will change to Crestor 20 mg daily generic and the patient will try this for 4 weeks with a lipid liver panel in about 4 weeks fasting. If we can get the LDL C down closer to goal we will stay on this treatment plan.  2. Atherosclerosis of coronary artery bypass graft of native heart  without angina pectoris -Continue to follow-up with cardiology  3. Thoracic aorta atherosclerosis ([place]) -Continue with aggressive therapeutic lifestyle changes and statin therapy with a trial of Crestor to see if we can get better results than lovastatin  4. Essential hypertension -The blood pressure is good today and the patient will continue with current treatment  5. Vitamin D deficiency -Reduce vitamin D3 2 2000 daily from 2000 daily and 4000 on Saturday and Sunday  Patient Instructions                       Medicare Annual Wellness Visit  [facility] and the medical providers at [facility] strive to bring you the best medical care.  In doing so we not only want to address your current medical conditions and concerns but also to detect new conditions early and prevent illness, disease and health-related problems.    Medicare offers a yearly Wellness Visit which allows our clinical  staff to assess your need for preventative services including immunizations, lifestyle education, counseling to decrease risk of preventable diseases and screening for fall risk and other medical concerns.    This visit is provided free of charge (no copay) for all Medicare recipients. The clinical pharmacists at [facility] have begun to conduct these Wellness Visits which will also include a thorough review of all your medications.    As you primary medical provider recommend that you make an appointment for your Annual Wellness Visit if you have not done so already this year.  You may set up this appointment before you leave today or you may call back [phone] and schedule an appointment.  Please make sure when you call that you mention that you are scheduling your Annual Wellness Visit with the clinical pharmacist so that the appointment may be made for the proper length of time.  ,m  Continue current medications. Continue good therapeutic lifestyle changes  which include good diet and exercise. Fall precautions discussed with patient. If an FOBT was given today- please return it to our front desk. If you are over 72 years old - you may need Prevnar 72 or the adult Pneumonia vaccine.  **Flu shots are available--- please call and schedule a FLU-CLINIC appointment**  After your visit with [place] today you will receive a survey in the mail or online from [employer] regarding your care with [place]. Please take a moment to fill this out. Your feedback is very important to [place] as you can help [place] better understand your patient needs as well as improve your experience and satisfaction. WE CARE ABOUT YOU!!!   We will ask you to hold the lovastatin and try Crestor 20 mg generic 1 daily and come by the office in about 4 weeks and get a traditional lipid liver panel to see if this helps bring the LDL C down more. In conjunction with this the patient should try to get more exercise and continue to watch his diet closely He should follow-up with the cardiologist as planned every 6 months This winter he should drink plenty of fluids and stay well hydrated    Arrie Senate MD

## 2015-11-23 ENCOUNTER — Other Ambulatory Visit: Payer: Self-pay | Admitting: Family Medicine

## 2015-12-01 ENCOUNTER — Other Ambulatory Visit (INDEPENDENT_AMBULATORY_CARE_PROVIDER_SITE_OTHER): Payer: Commercial Managed Care - HMO

## 2015-12-01 DIAGNOSIS — E7800 Pure hypercholesterolemia, unspecified: Secondary | ICD-10-CM | POA: Diagnosis not present

## 2015-12-02 LAB — LIPID PANEL
Chol/HDL Ratio: 2 ratio units (ref 0.0–5.0)
Cholesterol, Total: 161 mg/dL (ref 100–199)
HDL: 81 mg/dL (ref >39)
LDL CALC: 70 mg/dL (ref 0–99)
TRIGLYCERIDES: 51 mg/dL (ref 0–149)
VLDL CHOLESTEROL CAL: 10 mg/dL (ref 5–40)

## 2015-12-02 LAB — HEPATIC FUNCTION PANEL
ALT: 14 IU/L (ref 0–44)
AST: 22 IU/L (ref 0–40)
Albumin: 4.2 g/dL (ref 3.5–4.7)
Alkaline Phosphatase: 70 IU/L (ref 39–117)
Bilirubin Total: 0.4 mg/dL (ref 0.0–1.2)
Bilirubin, Direct: 0.14 mg/dL (ref 0.00–0.40)
Total Protein: 6.4 g/dL (ref 6.0–8.5)

## 2016-01-20 ENCOUNTER — Other Ambulatory Visit: Payer: Self-pay | Admitting: Family Medicine

## 2016-01-20 NOTE — Telephone Encounter (Signed)
Patient last seen in office on 11-02-15. Rx last filled on 12-27 for #30. Please advise and route to Pool A so nurse can phone in to pharmacy

## 2016-02-04 DIAGNOSIS — Z0100 Encounter for examination of eyes and vision without abnormal findings: Secondary | ICD-10-CM | POA: Diagnosis not present

## 2016-02-22 ENCOUNTER — Other Ambulatory Visit: Payer: Self-pay | Admitting: Family Medicine

## 2016-03-07 ENCOUNTER — Other Ambulatory Visit: Payer: Self-pay | Admitting: Family Medicine

## 2016-04-05 ENCOUNTER — Ambulatory Visit: Payer: Commercial Managed Care - HMO | Admitting: Family Medicine

## 2016-04-07 ENCOUNTER — Encounter: Payer: Self-pay | Admitting: Family Medicine

## 2016-04-07 ENCOUNTER — Telehealth (HOSPITAL_COMMUNITY): Payer: Self-pay | Admitting: *Deleted

## 2016-04-07 ENCOUNTER — Ambulatory Visit (INDEPENDENT_AMBULATORY_CARE_PROVIDER_SITE_OTHER): Payer: Commercial Managed Care - HMO | Admitting: Family Medicine

## 2016-04-07 ENCOUNTER — Ambulatory Visit (INDEPENDENT_AMBULATORY_CARE_PROVIDER_SITE_OTHER): Payer: Commercial Managed Care - HMO

## 2016-04-07 VITALS — BP 96/60 | HR 90 | Temp 96.7°F | Ht 74.0 in | Wt 173.0 lb

## 2016-04-07 DIAGNOSIS — I499 Cardiac arrhythmia, unspecified: Secondary | ICD-10-CM

## 2016-04-07 DIAGNOSIS — I700 Atherosclerosis of aorta: Secondary | ICD-10-CM

## 2016-04-07 DIAGNOSIS — I10 Essential (primary) hypertension: Secondary | ICD-10-CM

## 2016-04-07 DIAGNOSIS — I25810 Atherosclerosis of coronary artery bypass graft(s) without angina pectoris: Secondary | ICD-10-CM

## 2016-04-07 DIAGNOSIS — E559 Vitamin D deficiency, unspecified: Secondary | ICD-10-CM

## 2016-04-07 DIAGNOSIS — R0989 Other specified symptoms and signs involving the circulatory and respiratory systems: Secondary | ICD-10-CM

## 2016-04-07 DIAGNOSIS — I739 Peripheral vascular disease, unspecified: Secondary | ICD-10-CM

## 2016-04-07 DIAGNOSIS — N400 Enlarged prostate without lower urinary tract symptoms: Secondary | ICD-10-CM

## 2016-04-07 DIAGNOSIS — E7800 Pure hypercholesterolemia, unspecified: Secondary | ICD-10-CM | POA: Diagnosis not present

## 2016-04-07 MED ORDER — APIXABAN 5 MG PO TABS: 5.0000 mg | ORAL_TABLET | Freq: Two times a day (BID) | ORAL | 3 refills | Status: DC

## 2016-04-07 NOTE — Addendum Note (Signed)
Addended by: Zannie Cove on: 04/07/2016 11:25 AM   Modules accepted: Orders

## 2016-04-07 NOTE — Progress Notes (Addendum)
Subjective:    Patient ID: Danny [name]., male    DOB: 04-30-1931, 83 y.o.   MRN: [id number]  HPI Pt here for follow up and management of chronic medical problems which includes hyperlipidemia and hypertension. He is taking medication regularly.The patient is doing well overall. He has a history of a CABG in the past and continues to follow-up with a cardiologist regularly. He is due to get a rectal exam today. He is also due to get a chest x-ray and lab work and urinalysis. The patient denies any chest pain or shortness of breath. He is due to see his cardiologist Dr. Nigel Sloop in reasonable soon and will get an appointment to see him. He's having no trouble with his intestinal tract including nausea vomiting heartburn indigestion blood in the stool or black tarry bowel movements. He is passing his water without problems. He will get a rectal exam today but because of his age we will not do a PSA. The patient does indicate that his legs tire out more easily. They do not ache. When he sees the cardiologist they normally check his carotids. They have checked the lower extremity in the past.   Patient Active Problem List   Diagnosis Date Noted  . Hematest positive stools   . Dysphagia, pharyngoesophageal phase   . Heme + stool 02/27/2014  . Constipation 02/27/2014  . Dysphagia 02/27/2014  . Thoracic aorta atherosclerosis ([place]) 01/14/2014  . 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 Bilateral carotid bruits 05/27/2013  . Enteritis due to Clostridium difficile, history of 11/21/2012  . Hyperplasia of prostate 05/18/2010  . Carotid stenosis 05/18/2010  . CAROTID BRUIT 01/06/2010  . HYPERCHOLESTEROLEMIA  IIA 05/19/2008  . SYNCOPE-CAROTID SINUS 05/19/2008  . BLURRED VISION 05/19/2008  . Essential hypertension 05/19/2008  . CAD, ARTERY BYPASS GRAFT 05/19/2008  . 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  ALPRAZolam (XANAX) 0.25 MG tablet Take 1 Tablet by mouth at bedtime as needed  . aspirin 325 MG EC tablet Take 325 mg by mouth every morning.   . Cholecalciferol (VITAMIN D-3 PO) Take 2,000 mg by mouth every morning.   . fluticasone (FLONASE) 50 MCG/ACT nasal spray Place 2 sprays into both nostrils daily. (Patient taking differently: Place 2 sprays into both nostrils daily as needed for allergies. )  . losartan (COZAAR) 50 MG tablet TAKE 1 TABLET EVERY MORNING  . lovastatin (MEVACOR) 40 MG tablet TAKE 1 TABLET (40 MG TOTAL) BY MOUTH EVERY MORNING.  . metoprolol tartrate (LOPRESSOR) 25 MG tablet TAKE 1/2 TABLET TWICE DAILY  . Multiple Vitamin (MULTIVITAMIN) tablet Take 1 tablet by mouth every morning.   [name] NITROSTAT 0.4 MG SL tablet PLACE 1 TABLET (0.4 MG TOTAL) UNDER THE TONGUE EVERY 5 (FIVE) MINUTES AS NEEDED FOR CHEST PAIN.  [name] Omega-3 Fatty Acids (FISH OIL) 1000 MG CAPS Take 2 capsules by mouth every morning.   [name] omeprazole (PRILOSEC) 20 MG capsule TAKE 1 CAPSULE TWICE DAILY 30 MINUTES PRIOR TO BREAKFAST AND SUPPER  . polyethylene glycol-electrolytes (TRILYTE) 420 G solution Take 4,000 mLs by mouth as directed.  . Probiotic Product (ALIGN) 4 MG CAPS Take 4 mg by mouth daily as needed.   . rosuvastatin (CRESTOR) 20 MG tablet Take 1 tablet (20 mg total) by mouth daily.   No facility-administered encounter medications on file as of 04/07/2016.       Review of Systems  Constitutional:  Negative.   HENT: Negative.   Eyes: Negative.   Respiratory: Negative.   Cardiovascular: Negative.   Gastrointestinal: Negative.   Endocrine: Negative.   Genitourinary: Negative.   Musculoskeletal: Negative.   Skin: Negative.   Allergic/Immunologic: Negative.   Neurological: Negative.   Hematological: Negative.   Psychiatric/Behavioral: Negative.        Objective:   Physical Exam  Constitutional: He is oriented to person, place, and time. He appears well-developed and well-nourished. No distress.    The patient is pleasant and calm and alert.  HENT:  Head: Normocephalic and atraumatic.  Right Ear: External ear normal.  Left Ear: External ear normal.  Nose: Nose normal.  Mouth/Throat: Oropharynx is clear and moist. No oropharyngeal exudate.  Eyes: Conjunctivae and EOM are normal. Pupils are equal, round, and reactive to light. Right eye exhibits no discharge. Left eye exhibits no discharge. No scleral icterus.  Neck: Normal range of motion. Neck supple. No thyromegaly present.  The patient has an audible bruit over the right carotid. He also has diminished pulses on the right upper extremity.  Cardiovascular: Normal rate and intact distal pulses.   No murmur heard. The heart is irregular irregular today at 72/m.  Pulmonary/Chest: Effort normal and breath sounds normal. No respiratory distress. He has no wheezes. He has no rales. He exhibits no tenderness.  No axillary adenopathy and lungs are clear anteriorly and posteriorly.  Abdominal: Soft. Bowel sounds are normal. He exhibits no mass. There is no tenderness. There is no rebound and no guarding.  The abdomen was nontender without liver or spleen enlargement or bruits. There were good inguinal pulses bilaterally.  Genitourinary: Rectum normal and penis normal.  Genitourinary Comments: The prostate was enlarged but soft and smooth. There were no inguinal hernias palpable. The external genitalia were within normal limits. The rectal exam was normal other than the enlarged prostate.  Musculoskeletal: Normal range of motion. He exhibits no edema.  Lymphadenopathy:    He has no cervical adenopathy.  Neurological: He is alert and oriented to person, place, and time. He has normal reflexes. No cranial nerve deficit.  Skin: Skin is warm and dry. No rash noted.  Psychiatric: He has a normal mood and affect. His behavior is normal. Judgment and thought content normal.  Nursing note and vitals reviewed.  BP 96/60 (BP Location: Left Arm)    Pulse 90   Temp (!) 96.7 F (35.9 C) (Oral)   Ht 6' 2"  (1.88 m)   Wt 173 lb (78.5 kg)   BMI 22.21 kg/m   EKG with results pending--- the EKG showed atrial fibrillation with a rate of 94/m. I will discuss this with his cardiologist and we will most likely start him on a blood thinner and make sure that he hasn't an upcoming appointment soon with the cardiologist. I did speak with the cardiologist and he would like for [place] to start him on Elocon was 5 mg twice daily and he will call him and try to get him in to the office to be seen tomorrow. The patient will be informed of this.      Assessment & Plan:  1. HYPERCHOLESTEROLEMIA  IIA -Continue with current treatment and aggressive therapeutic lifestyle changes - CBC with Differential/Platelet; Future - Lipid panel; Future  2. Atherosclerosis of coronary artery bypass graft of native heart without angina pectoris -Continue follow-up with cardiology - CBC with Differential/Platelet; Future - Lipid panel; Future - DG Chest 2 View; Future  3. Thoracic aorta atherosclerosis ([place]) -  Continue with aggressive therapeutic lifestyle changes and current statin drug - CBC with Differential/Platelet; Future - Lipid panel; Future - DG Chest 2 View; Future - EKG 12-Lead  4. Essential hypertension -The blood pressure is actually low today and this may be due somewhat to his irregular heartbeat. - CBC with Differential/Platelet; Future - BMP8+EGFR; Future - Hepatic function panel; Future - DG Chest 2 View; Future - EKG 12-Lead  5. Vitamin D deficiency -Continue current treatment pending results of lab work - CBC with Differential/Platelet; Future - VITAMIN D 25 Hydroxy (Vit-D Deficiency, Fractures); Future  6. Benign prostatic hyperplasia, unspecified whether lower urinary tract symptoms present -The prostate is enlarged but soft and smooth. The rectal exam was otherwise negative and the patient's not having any symptoms associated with the  enlarged prostate. - CBC with Differential/Platelet; Future - Urinalysis, Complete  7. Irregular heart beat - EKG 12-Lead -The rhythm was atrial fibrillation with a rate of 90/m. I did speak with the cardiologist and he will plan to see him hopefully tomorrow and would like for [place] to go ahead and start him on Eliqius was 5 mg twice daily. This will be called in to his preferred pharmacy. -The patient was made aware of the conversation with the cardiologist and understands that he will be called with an appointment to be seen tomorrow.  8. Intermittent claudication (HCC) -The tiredness in his legs with activity could certainly be secondary to some claudication symptoms. We will ask the patient to remind the cardiologist to consider getting some peripheral vascular Dopplers done.  9. Peripheral vascular insufficiency (HCC) -Decreased pedal pulses bilaterally and plan to get Dopplers of lower extremities.  10. Bilateral carotid bruits -Follow-up with cardiology and repeat Dopplers as planned  Patient Instructions                       Medicare Annual Wellness Visit  [facility] and the medical providers at [facility] strive to bring you the best medical care.  In doing so we not only want to address your current medical conditions and concerns but also to detect new conditions early and prevent illness, disease and health-related problems.    Medicare offers a yearly Wellness Visit which allows our clinical staff to assess your need for preventative services including immunizations, lifestyle education, counseling to decrease risk of preventable diseases and screening for fall risk and other medical concerns.    This visit is provided free of charge (no copay) for all Medicare recipients. The clinical pharmacists at [facility] have begun to conduct these Wellness Visits which will also include a thorough review of all your medications.    As you  primary medical provider recommend that you make an appointment for your Annual Wellness Visit if you have not done so already this year.  You may set up this appointment before you leave today or you may call back ([phone]) and schedule an appointment.  Please make sure when you call that you mention that you are scheduling your Annual Wellness Visit with the clinical pharmacist so that the appointment may be made for the proper length of time.     Continue current medications. Continue good therapeutic lifestyle changes which include good diet and exercise. Fall precautions discussed with patient. If an FOBT was given today- please return it to our front desk. If you are over 17 years old - you may need Prevnar 88 or the adult Pneumonia vaccine.  **  Flu shots are available--- please call and schedule a FLU-CLINIC appointment**  After your visit with [place] today you will receive a survey in the mail or online from [employer] regarding your care with [place]. Please take a moment to fill this out. Your feedback is very important to [place] as you can help [place] better understand your patient needs as well as improve your experience and satisfaction. WE CARE ABOUT YOU!!!   Please make sure that you follow-up with Dr. Liliane Channel as planned He may want to re-evaluate your lower extremity circulation with Dopplers as he checks your carotids. Try to walk regularly. Left him know if the legs continue to give out.  Arrie Senate MD

## 2016-04-07 NOTE — Telephone Encounter (Signed)
Pt was called and offered appt for tomorrow per scheduling instructions.  Pt declined the appt for tomorrow stating that he had a previous engagement.  Pt will come in Monday at 930

## 2016-04-07 NOTE — Patient Instructions (Addendum)
Medicare Annual Wellness Visit  [facility] and the medical providers at [facility] strive to bring you the best medical care.  In doing so we not only want to address your current medical conditions and concerns but also to detect new conditions early and prevent illness, disease and health-related problems.    Medicare offers a yearly Wellness Visit which allows our clinical staff to assess your need for preventative services including immunizations, lifestyle education, counseling to decrease risk of preventable diseases and screening for fall risk and other medical concerns.    This visit is provided free of charge (no copay) for all Medicare recipients. The clinical pharmacists at [facility] have begun to conduct these Wellness Visits which will also include a thorough review of all your medications.    As you primary medical provider recommend that you make an appointment for your Annual Wellness Visit if you have not done so already this year.  You may set up this appointment before you leave today or you may call back ([phone]) and schedule an appointment.  Please make sure when you call that you mention that you are scheduling your Annual Wellness Visit with the clinical pharmacist so that the appointment may be made for the proper length of time.     Continue current medications. Continue good therapeutic lifestyle changes which include good diet and exercise. Fall precautions discussed with patient. If an FOBT was given today- please return it to our front desk. If you are over 65 years old - you may need Prevnar 59 or the adult Pneumonia vaccine.  **Flu shots are available--- please call and schedule a FLU-CLINIC appointment**  After your visit with [place] today you will receive a survey in the mail or online from [employer] regarding your care with [place]. Please take a moment to fill this out. Your feedback is very  important to [place] as you can help [place] better understand your patient needs as well as improve your experience and satisfaction. WE CARE ABOUT YOU!!!   Please make sure that you follow-up with Dr. Liliane Channel as planned He may want to re-evaluate your lower extremity circulation with Dopplers as he checks your carotids. Try to walk regularly. Left him know if the legs continue to give out.

## 2016-04-08 ENCOUNTER — Other Ambulatory Visit (INDEPENDENT_AMBULATORY_CARE_PROVIDER_SITE_OTHER): Payer: Medicare HMO

## 2016-04-08 DIAGNOSIS — I700 Atherosclerosis of aorta: Secondary | ICD-10-CM | POA: Diagnosis not present

## 2016-04-08 DIAGNOSIS — I25810 Atherosclerosis of coronary artery bypass graft(s) without angina pectoris: Secondary | ICD-10-CM | POA: Diagnosis not present

## 2016-04-08 DIAGNOSIS — E7800 Pure hypercholesterolemia, unspecified: Secondary | ICD-10-CM | POA: Diagnosis not present

## 2016-04-08 DIAGNOSIS — I10 Essential (primary) hypertension: Secondary | ICD-10-CM | POA: Diagnosis not present

## 2016-04-08 DIAGNOSIS — N400 Enlarged prostate without lower urinary tract symptoms: Secondary | ICD-10-CM | POA: Diagnosis not present

## 2016-04-08 DIAGNOSIS — E559 Vitamin D deficiency, unspecified: Secondary | ICD-10-CM | POA: Diagnosis not present

## 2016-04-08 LAB — URINALYSIS, COMPLETE
Bilirubin, UA: NEGATIVE
GLUCOSE, UA: NEGATIVE
KETONES UA: NEGATIVE
Leukocytes, UA: NEGATIVE
Nitrite, UA: NEGATIVE
PROTEIN UA: NEGATIVE
RBC, UA: NEGATIVE
SPEC GRAV UA: 1.01 (ref 1.005–1.030)
UUROB: 0.2 mg/dL (ref 0.2–1.0)
pH, UA: 6 (ref 5.0–7.5)

## 2016-04-08 LAB — MICROSCOPIC EXAMINATION
Bacteria, UA: NONE SEEN
EPITHELIAL CELLS (NON RENAL): NONE SEEN /HPF (ref 0–10)
RBC, UA: NONE SEEN /hpf (ref 0–?)
Renal Epithel, UA: NONE SEEN /hpf
WBC, UA: NONE SEEN /hpf (ref 0–?)

## 2016-04-08 NOTE — Addendum Note (Signed)
Addended by: Earlene Plater on: 04/08/2016 08:09 AM   Modules accepted: Orders

## 2016-04-09 LAB — CBC WITH DIFFERENTIAL/PLATELET
BASOS: 1 %
Basophils Absolute: 0.1 10*3/uL (ref 0.0–0.2)
EOS (ABSOLUTE): 0.2 10*3/uL (ref 0.0–0.4)
Eos: 3 %
HEMATOCRIT: 43.9 % (ref 37.5–51.0)
Hemoglobin: 14.7 g/dL (ref 13.0–17.7)
Immature Grans (Abs): 0 10*3/uL (ref 0.0–0.1)
Immature Granulocytes: 0 %
LYMPHS ABS: 1.5 10*3/uL (ref 0.7–3.1)
Lymphs: 29 %
MCH: 31.3 pg (ref 26.6–33.0)
MCHC: 33.5 g/dL (ref 31.5–35.7)
MCV: 94 fL (ref 79–97)
MONOS ABS: 0.7 10*3/uL (ref 0.1–0.9)
Monocytes: 13 %
Neutrophils Absolute: 2.9 10*3/uL (ref 1.4–7.0)
Neutrophils: 54 %
Platelets: 260 10*3/uL (ref 150–379)
RBC: 4.69 x10E6/uL (ref 4.14–5.80)
RDW: 15.7 % — AB (ref 12.3–15.4)
WBC: 5.3 10*3/uL (ref 3.4–10.8)

## 2016-04-09 LAB — BMP8+EGFR
BUN/Creatinine Ratio: 11 (ref 10–24)
BUN: 10 mg/dL (ref 8–27)
CO2: 25 mmol/L (ref 18–29)
Calcium: 9.3 mg/dL (ref 8.6–10.2)
Chloride: 96 mmol/L (ref 96–106)
Creatinine, Ser: 0.94 mg/dL (ref 0.76–1.27)
GFR, EST AFRICAN AMERICAN: 86 mL/min/{1.73_m2} (ref >59)
GFR, EST NON AFRICAN AMERICAN: 75 mL/min/{1.73_m2} (ref >59)
Glucose: 94 mg/dL (ref 65–99)
Potassium: 5.2 mmol/L (ref 3.5–5.2)
SODIUM: 136 mmol/L (ref 134–144)

## 2016-04-09 LAB — LIPID PANEL
CHOLESTEROL TOTAL: 170 mg/dL (ref 100–199)
Chol/HDL Ratio: 2 ratio units (ref 0.0–5.0)
HDL: 84 mg/dL (ref >39)
LDL Calculated: 74 mg/dL (ref 0–99)
TRIGLYCERIDES: 62 mg/dL (ref 0–149)
VLDL Cholesterol Cal: 12 mg/dL (ref 5–40)

## 2016-04-09 LAB — HEPATIC FUNCTION PANEL
ALBUMIN: 4.2 g/dL (ref 3.5–4.7)
ALT: 15 IU/L (ref 0–44)
AST: 16 IU/L (ref 0–40)
Alkaline Phosphatase: 73 IU/L (ref 39–117)
Bilirubin Total: 0.6 mg/dL (ref 0.0–1.2)
Bilirubin, Direct: 0.23 mg/dL (ref 0.00–0.40)
Total Protein: 6.5 g/dL (ref 6.0–8.5)

## 2016-04-09 LAB — VITAMIN D 25 HYDROXY (VIT D DEFICIENCY, FRACTURES): Vit D, 25-Hydroxy: 80.7 ng/mL (ref 30.0–100.0)

## 2016-04-11 ENCOUNTER — Encounter (HOSPITAL_COMMUNITY): Payer: Self-pay | Admitting: Nurse Practitioner

## 2016-04-11 ENCOUNTER — Ambulatory Visit (HOSPITAL_COMMUNITY)
Admission: RE | Admit: 2016-04-11 | Discharge: 2016-04-11 | Disposition: A | Discharge status: Home / Self Care | Payer: Medicare HMO | Source: Ambulatory Visit | Attending: Nurse Practitioner | Admitting: Nurse Practitioner

## 2016-04-11 VITALS — BP 126/76 | HR 94 | Ht 74.0 in | Wt 171.0 lb

## 2016-04-11 DIAGNOSIS — I4891 Unspecified atrial fibrillation: Secondary | ICD-10-CM | POA: Diagnosis not present

## 2016-04-11 DIAGNOSIS — I2510 Atherosclerotic heart disease of native coronary artery without angina pectoris: Secondary | ICD-10-CM | POA: Insufficient documentation

## 2016-04-11 DIAGNOSIS — Z87891 Personal history of nicotine dependence: Secondary | ICD-10-CM | POA: Insufficient documentation

## 2016-04-11 DIAGNOSIS — A0472 Enterocolitis due to Clostridium difficile, not specified as recurrent: Secondary | ICD-10-CM | POA: Insufficient documentation

## 2016-04-11 DIAGNOSIS — Z85828 Personal history of other malignant neoplasm of skin: Secondary | ICD-10-CM | POA: Insufficient documentation

## 2016-04-11 DIAGNOSIS — I4819 Other persistent atrial fibrillation: Secondary | ICD-10-CM

## 2016-04-11 DIAGNOSIS — E559 Vitamin D deficiency, unspecified: Secondary | ICD-10-CM | POA: Diagnosis not present

## 2016-04-11 DIAGNOSIS — I739 Peripheral vascular disease, unspecified: Secondary | ICD-10-CM | POA: Diagnosis not present

## 2016-04-11 DIAGNOSIS — Z823 Family history of stroke: Secondary | ICD-10-CM | POA: Insufficient documentation

## 2016-04-11 DIAGNOSIS — Z833 Family history of diabetes mellitus: Secondary | ICD-10-CM | POA: Insufficient documentation

## 2016-04-11 DIAGNOSIS — K228 Other specified diseases of esophagus: Secondary | ICD-10-CM | POA: Diagnosis not present

## 2016-04-11 DIAGNOSIS — Z79899 Other long term (current) drug therapy: Secondary | ICD-10-CM | POA: Diagnosis not present

## 2016-04-11 DIAGNOSIS — Z8249 Family history of ischemic heart disease and other diseases of the circulatory system: Secondary | ICD-10-CM | POA: Diagnosis not present

## 2016-04-11 DIAGNOSIS — I481 Persistent atrial fibrillation: Secondary | ICD-10-CM

## 2016-04-11 DIAGNOSIS — Z800 Family history of malignant neoplasm of digestive organs: Secondary | ICD-10-CM | POA: Diagnosis not present

## 2016-04-11 DIAGNOSIS — Z7982 Long term (current) use of aspirin: Secondary | ICD-10-CM | POA: Diagnosis not present

## 2016-04-11 DIAGNOSIS — Z888 Allergy status to other drugs, medicaments and biological substances status: Secondary | ICD-10-CM | POA: Insufficient documentation

## 2016-04-11 DIAGNOSIS — Z9889 Other specified postprocedural states: Secondary | ICD-10-CM | POA: Diagnosis not present

## 2016-04-11 DIAGNOSIS — E785 Hyperlipidemia, unspecified: Secondary | ICD-10-CM | POA: Diagnosis not present

## 2016-04-11 DIAGNOSIS — I10 Essential (primary) hypertension: Secondary | ICD-10-CM | POA: Diagnosis not present

## 2016-04-11 DIAGNOSIS — F419 Anxiety disorder, unspecified: Secondary | ICD-10-CM | POA: Insufficient documentation

## 2016-04-11 DIAGNOSIS — Z951 Presence of aortocoronary bypass graft: Secondary | ICD-10-CM | POA: Insufficient documentation

## 2016-04-11 LAB — TSH: TSH: 0.801 u[IU]/mL (ref 0.350–4.500)

## 2016-04-11 MED ORDER — APIXABAN 5 MG PO TABS: 5.0000 mg | ORAL_TABLET | Freq: Two times a day (BID) | ORAL | 0 refills | Status: DC

## 2016-04-11 NOTE — Progress Notes (Signed)
Primary Care Physician: Redge Gainer, MD Referring Physician: Dr. Laurance Flatten Cardiologist: Dr. Murrell Converse. is a 83 y.o. male with a h/o PVD, CAD s/p bypass that was recently found to have afib with CVR at physical. Pt is minimally symptomatic. He will feel a slight irregular hear beat at times but otherwise has not noted any change in his energy or shortness of breath with exertion. He states that he is very active, chopping wood, walking and feels well.  Review of lifestyle shows pt had been taking 3-4 BC powders a day and drinking 5-6 beers a night. He has since stopped the [facility] powders and alcohol. Some caffeine but not excessive. Wife states that he use to snore but does not believe that it as  prevalent as it once was, but she does not sleep in the same room as him now. He did not pick up the eliquis he was prescribed because it would have cost him $200. He is interested in taking warfarin but to get a timely cardioversion it would be best to start [place] first with free 30 days card and plan on transfer to warfarin later.   He has no bleeding history or bleeding tendencies.   Today, he denies symptoms of palpitations, chest pain, shortness of breath, orthopnea, PND, lower extremity edema, dizziness, presyncope, syncope, or neurologic sequela. The patient is tolerating medications without difficulties and is otherwise without complaint today.   Past Medical History:  Diagnosis Date  . Anxiety   . C. difficile colitis   . Hyperlipidemia   . Hypertension   . Vitamin D deficiency    Past Surgical History:  Procedure Laterality Date  . CAROTID ENDARTERECTOMY Left   . CAROTID-SUBCLAVIAN BYPASS GRAFT Right   . COLONOSCOPY  2005   hyperplastic polyp x 1 with no adenoma  . COLONOSCOPY N/A 04/11/2014   YQM:VHQI diverticulosis in the sigmoid colon/left colon is redundant  . CORONARY ARTERY BYPASS GRAFT    . ESOPHAGEAL DILATION N/A 04/11/2014   Procedure: ESOPHAGEAL DILATION;   Surgeon: [name], MD;  Location: [facility]ITE;  Service: Endoscopy;  Laterality: N/A;  . ESOPHAGOGASTRODUODENOSCOPY N/A 04/11/2014   ONG:[id number] at the gastro junction/moderate erosive/duodenal web  . SKIN CANCER EXCISION      Current Outpatient Prescriptions  Medication Sig Dispense Refill  . aspirin 325 MG EC tablet Take 325 mg by mouth every morning.     . Cholecalciferol (VITAMIN D-3 PO) Take 2,000 mg by mouth every morning.     . fluticasone (FLONASE) 50 MCG/ACT nasal spray Place 2 sprays into both nostrils daily. (Patient taking differently: Place 2 sprays into both nostrils daily as needed for allergies. ) 16 g 6  . losartan (COZAAR) 50 MG tablet TAKE 1 TABLET EVERY MORNING 90 tablet 0  . lovastatin (MEVACOR) 40 MG tablet TAKE 1 TABLET (40 MG TOTAL) BY MOUTH EVERY MORNING. 90 tablet 3  . metoprolol tartrate (LOPRESSOR) 25 MG tablet TAKE 1/2 TABLET TWICE DAILY 90 tablet 0  . Multiple Vitamin (MULTIVITAMIN) tablet Take 1 tablet by mouth every morning.     . Omega-3 Fatty Acids (FISH OIL) 1000 MG CAPS Take 2 capsules by mouth every morning.     [name] omeprazole (PRILOSEC) 20 MG capsule TAKE 1 CAPSULE TWICE DAILY 30 MINUTES PRIOR TO BREAKFAST AND SUPPER 180 capsule 0  . polyethylene glycol-electrolytes (TRILYTE) 420 G solution Take 4,000 mLs by mouth as directed. 4000 mL 0  . Probiotic Product (ALIGN) 4  MG CAPS Take 4 mg by mouth daily as needed.     . rosuvastatin (CRESTOR) 20 MG tablet Take 1 tablet (20 mg total) by mouth daily. 30 tablet 3  . ALPRAZolam (XANAX) 0.25 MG tablet Take 1 Tablet by mouth at bedtime as needed 30 tablet 2  . apixaban (ELIQUIS) 5 MG TABS tablet Take 1 tablet (5 mg total) by mouth 2 (two) times daily. 60 tablet 0  . NITROSTAT 0.4 MG SL tablet PLACE 1 TABLET (0.4 MG TOTAL) UNDER THE TONGUE EVERY 5 (FIVE) MINUTES AS NEEDED FOR CHEST PAIN. 25 tablet 1   No current facility-administered medications for this encounter.     Allergies  Allergen Reactions  .  Doxycycline Diarrhea  . Contrast Media [Iodinated Diagnostic Agents]     Syncope  . Eszopiclone   . Lunesta [Eszopiclone]   . Morphine And Related Nausea And Vomiting    Social History   Social History  . Marital status: Married    Spouse name: N/A  . Number of children: N/A  . Years of education: N/A   Occupational History  . Not on file.   Social History Main Topics  . Smoking status: Former Smoker    Types: Cigarettes    Start date: 01/24/1950    Quit date: 01/24/1961  . Smokeless tobacco: Never Used  . Alcohol use 0.0 oz/week     Comment: not drank since 12/2009  . Drug use: No  . Sexual activity: Yes    Partners: Female   Other Topics Concern  . Not on file   Social History Narrative  . No narrative on file    Family History  Problem Relation Age of Onset  . Heart disease Mother   . Stroke Mother   . Stroke Father   . Diabetes Son   . Colon cancer Neg Hx     "not that I know of."    ROS- All systems are reviewed and negative except as per the HPI above  Physical Exam: Vitals:   04/11/16 0932  BP: 126/76  Pulse: 94  Weight: 171 lb (77.6 kg)  Height: 6\' 2"  (1.88 m)   Wt Readings from Last 3 Encounters:  04/11/16 171 lb (77.6 kg)  04/07/16 173 lb (78.5 kg)  11/02/15 169 lb (76.7 kg)    Labs: Lab Results  Component Value Date   NA 136 04/08/2016   K 5.2 04/08/2016   CL 96 04/08/2016   CO2 25 04/08/2016   GLUCOSE 94 04/08/2016   BUN 10 04/08/2016   CREATININE 0.94 04/08/2016   CALCIUM 9.3 04/08/2016   Lab Results  Component Value Date   INR 0.9 11/22/2007   Lab Results  Component Value Date   CHOL 170 04/08/2016   HDL 84 04/08/2016   LDLCALC 74 04/08/2016   TRIG 62 04/08/2016     GEN- The patient is well appearing, alert and oriented x 3 today.   Head- normocephalic, atraumatic Eyes-  Sclera clear, conjunctiva pink Ears- hearing intact Oropharynx- clear Neck- supple, no JVP Lymph- no cervical lymphadenopathy Lungs- Clear to  ausculation bilaterally, normal work of breathing Heart- irregular rate and rhythm, no murmurs, rubs or gallops, PMI not laterally displaced GI- soft, NT, ND, + BS Extremities- no clubbing, cyanosis, or edema MS- no significant deformity or atrophy Skin- no rash or lesion Psych- euthymic mood, full affect Neuro- strength and sensation are intact  EKG- afib at 94 bpm, qrs int 90 ms, qtc 427 ms Epic records reviewed  Assessment and Plan: 1. New onset afib Pt advised to avoid BC powders Tylenol if needed for pain Avoid alcohol, no more than 2 drinks a week. Continue regular exercise Continue metoprolol  25 mg  Will start on eliquis 5 mg bid,  30 day free voucher for chadsvasc score of at least Echo   f/u here in 2 weeks following echo  Butch Penny C. Amylynn Fano, [facility] [address] [place], [place] [address] [phone]

## 2016-04-11 NOTE — Patient Instructions (Signed)
Your physician has recommended you make the following change in your medication:  1)Start Eliquis 5mg  twice a day 2)Stop Aspirin

## 2016-04-13 ENCOUNTER — Other Ambulatory Visit: Payer: Self-pay | Admitting: Cardiovascular Disease

## 2016-04-18 ENCOUNTER — Other Ambulatory Visit: Payer: Self-pay | Admitting: Family Medicine

## 2016-04-25 ENCOUNTER — Encounter (HOSPITAL_COMMUNITY): Payer: Self-pay | Admitting: Nurse Practitioner

## 2016-04-25 ENCOUNTER — Ambulatory Visit (HOSPITAL_BASED_OUTPATIENT_CLINIC_OR_DEPARTMENT_OTHER)
Admission: RE | Admit: 2016-04-25 | Discharge: 2016-04-25 | Disposition: A | Discharge status: Home / Self Care | Payer: Medicare HMO | Source: Ambulatory Visit | Attending: Nurse Practitioner | Admitting: Nurse Practitioner

## 2016-04-25 ENCOUNTER — Ambulatory Visit (HOSPITAL_COMMUNITY)
Admission: RE | Admit: 2016-04-25 | Discharge: 2016-04-25 | Disposition: A | Discharge status: Home / Self Care | Payer: Medicare HMO | Source: Ambulatory Visit | Attending: Nurse Practitioner | Admitting: Nurse Practitioner

## 2016-04-25 ENCOUNTER — Other Ambulatory Visit: Payer: Self-pay

## 2016-04-25 VITALS — BP 146/78 | HR 95 | Ht 74.0 in | Wt 169.0 lb

## 2016-04-25 DIAGNOSIS — I517 Cardiomegaly: Secondary | ICD-10-CM | POA: Diagnosis not present

## 2016-04-25 DIAGNOSIS — Z79899 Other long term (current) drug therapy: Secondary | ICD-10-CM | POA: Insufficient documentation

## 2016-04-25 DIAGNOSIS — Z8249 Family history of ischemic heart disease and other diseases of the circulatory system: Secondary | ICD-10-CM | POA: Diagnosis not present

## 2016-04-25 DIAGNOSIS — K228 Other specified diseases of esophagus: Secondary | ICD-10-CM | POA: Diagnosis not present

## 2016-04-25 DIAGNOSIS — F419 Anxiety disorder, unspecified: Secondary | ICD-10-CM | POA: Diagnosis not present

## 2016-04-25 DIAGNOSIS — Z955 Presence of coronary angioplasty implant and graft: Secondary | ICD-10-CM | POA: Insufficient documentation

## 2016-04-25 DIAGNOSIS — A0472 Enterocolitis due to Clostridium difficile, not specified as recurrent: Secondary | ICD-10-CM | POA: Insufficient documentation

## 2016-04-25 DIAGNOSIS — I4891 Unspecified atrial fibrillation: Secondary | ICD-10-CM | POA: Insufficient documentation

## 2016-04-25 DIAGNOSIS — Z9889 Other specified postprocedural states: Secondary | ICD-10-CM | POA: Insufficient documentation

## 2016-04-25 DIAGNOSIS — Z800 Family history of malignant neoplasm of digestive organs: Secondary | ICD-10-CM | POA: Insufficient documentation

## 2016-04-25 DIAGNOSIS — Z823 Family history of stroke: Secondary | ICD-10-CM | POA: Diagnosis not present

## 2016-04-25 DIAGNOSIS — I481 Persistent atrial fibrillation: Secondary | ICD-10-CM | POA: Insufficient documentation

## 2016-04-25 DIAGNOSIS — Z87891 Personal history of nicotine dependence: Secondary | ICD-10-CM | POA: Insufficient documentation

## 2016-04-25 DIAGNOSIS — Z85828 Personal history of other malignant neoplasm of skin: Secondary | ICD-10-CM | POA: Insufficient documentation

## 2016-04-25 DIAGNOSIS — Z888 Allergy status to other drugs, medicaments and biological substances status: Secondary | ICD-10-CM | POA: Insufficient documentation

## 2016-04-25 DIAGNOSIS — I10 Essential (primary) hypertension: Secondary | ICD-10-CM | POA: Insufficient documentation

## 2016-04-25 DIAGNOSIS — I4819 Other persistent atrial fibrillation: Secondary | ICD-10-CM

## 2016-04-25 DIAGNOSIS — E559 Vitamin D deficiency, unspecified: Secondary | ICD-10-CM | POA: Diagnosis not present

## 2016-04-25 DIAGNOSIS — I2510 Atherosclerotic heart disease of native coronary artery without angina pectoris: Secondary | ICD-10-CM | POA: Insufficient documentation

## 2016-04-25 DIAGNOSIS — E785 Hyperlipidemia, unspecified: Secondary | ICD-10-CM | POA: Diagnosis not present

## 2016-04-25 DIAGNOSIS — Z833 Family history of diabetes mellitus: Secondary | ICD-10-CM | POA: Insufficient documentation

## 2016-04-25 DIAGNOSIS — I340 Nonrheumatic mitral (valve) insufficiency: Secondary | ICD-10-CM | POA: Insufficient documentation

## 2016-04-25 LAB — BASIC METABOLIC PANEL
ANION GAP: 7 (ref 5–15)
BUN: 10 mg/dL (ref 6–20)
CALCIUM: 9.4 mg/dL (ref 8.9–10.3)
CO2: 29 mmol/L (ref 22–32)
Chloride: 98 mmol/L — ABNORMAL LOW (ref 101–111)
Creatinine, Ser: 0.89 mg/dL (ref 0.61–1.24)
Glucose, Bld: 102 mg/dL — ABNORMAL HIGH (ref 65–99)
Potassium: 4.3 mmol/L (ref 3.5–5.1)
Sodium: 134 mmol/L — ABNORMAL LOW (ref 135–145)

## 2016-04-25 LAB — CBC
HCT: 43.4 % (ref 39.0–52.0)
Hemoglobin: 14.9 g/dL (ref 13.0–17.0)
MCH: 31.3 pg (ref 26.0–34.0)
MCHC: 34.3 g/dL (ref 30.0–36.0)
MCV: 91.2 fL (ref 78.0–100.0)
PLATELETS: 272 10*3/uL (ref 150–400)
RBC: 4.76 MIL/uL (ref 4.22–5.81)
RDW: 15.2 % (ref 11.5–15.5)
WBC: 6.6 10*3/uL (ref 4.0–10.5)

## 2016-04-25 NOTE — Patient Instructions (Signed)
Cardioversion scheduled for Thursday, April 12th  - Arrive at the [facility] and go to admitting at 10:30AM  -Do not eat or drink anything after midnight the night prior to your procedure.  - Take all your medication with a sip of water prior to arrival.  - You will not be able to drive home after your procedure.

## 2016-04-25 NOTE — Progress Notes (Signed)
Primary Care Physician: Redge Gainer, MD Referring Physician: Dr. Laurance Flatten Cardiologist: Dr. Murrell Converse. is a 83 y.o. male with a h/o PVD, CAD s/p bypass that was recently found to have afib with CVR at physical. Pt is minimally symptomatic. He will feel a slight irregular hear beat at times but otherwise has not noted any change in his energy or shortness of breath with exertion. He states that he is very active, chopping wood, walking and feels well.  Review of lifestyle shows pt had been taking 3-4 BC powders a day and drinking 5-6 beers a night. He has since stopped the [facility] powders and alcohol. Some caffeine but not excessive. Wife states that he use to snore but does not believe that it as  prevalent as it once was, but she does not sleep in the same room as him now. He did not pick up the eliquis he was prescribed because it would have cost him $200. He is interested in taking warfarin but to get a timely cardioversion it would be best to start Doac first with free 30 days card and plan on transfer to warfarin later. He has no bleeding history or bleeding tendencies.   F/u in afib clinic f/u updated echo. He has now been on apixaban 5 mg bid since 3/20. He has tried to go back to walking now that weather is warmer and has noted that he does tire out more easily since in afib. Discussion of trying to return to sinus rhythm with cardioversion, risk vrs benefit explained and pt would like to proceed.weight is stable. No unusual shortness of breath, no PND or orthopnea, no pedal edema.  Today, he denies symptoms of palpitations, chest pain, shortness of breath, orthopnea, PND, lower extremity edema, dizziness, presyncope, syncope, or neurologic sequela. The patient is tolerating medications without difficulties and is otherwise without complaint today.   Past Medical History:  Diagnosis Date  . Anxiety   . C. difficile colitis   . Hyperlipidemia   . Hypertension   . Vitamin D  deficiency    Past Surgical History:  Procedure Laterality Date  . CAROTID ENDARTERECTOMY Left   . CAROTID-SUBCLAVIAN BYPASS GRAFT Right   . COLONOSCOPY  2005   hyperplastic polyp x 1 with no adenoma  . COLONOSCOPY N/A 04/11/2014   WVP:XTGG diverticulosis in the sigmoid colon/left colon is redundant  . CORONARY ARTERY BYPASS GRAFT    . ESOPHAGEAL DILATION N/A 04/11/2014   Procedure: ESOPHAGEAL DILATION;  Surgeon: [name], MD;  [facility];  Service: Endoscopy;  Laterality: N/A;  . ESOPHAGOGASTRODUODENOSCOPY N/A 04/11/2014   [id number] at the gastro junction/moderate erosive/duodenal web  . SKIN CANCER EXCISION      Current Outpatient Prescriptions  Medication Sig Dispense Refill  . ALPRAZolam (XANAX) 0.25 MG tablet Take 1 Tablet by mouth at bedtime AS NEEDED 30 tablet 2  . apixaban (ELIQUIS) 5 MG TABS tablet Take 1 tablet (5 mg total) by mouth 2 (two) times daily. 60 tablet 0  . Cholecalciferol (VITAMIN D-3 PO) Take 2,000 mg by mouth every morning.     . fluticasone (FLONASE) 50 MCG/ACT nasal spray Place 2 sprays into both nostrils daily. (Patient taking differently: Place 2 sprays into both nostrils daily as needed for allergies. ) 16 g 6  . losartan (COZAAR) 50 MG tablet TAKE 1 TABLET EVERY MORNING 90 tablet 0  . lovastatin (MEVACOR) 40 MG tablet TAKE 1 TABLET EVERY MORNING 90 tablet 0  .  metoprolol tartrate (LOPRESSOR) 25 MG tablet TAKE 1/2 TABLET TWICE DAILY 90 tablet 0  . Multiple Vitamin (MULTIVITAMIN) tablet Take 1 tablet by mouth every morning.     [name] NITROSTAT 0.4 MG SL tablet PLACE 1 TABLET (0.4 MG TOTAL) UNDER THE TONGUE EVERY 5 (FIVE) MINUTES AS NEEDED FOR CHEST PAIN. 25 tablet 1  . Omega-3 Fatty Acids (FISH OIL) 1000 MG CAPS Take 2 capsules by mouth every morning.     [name] omeprazole (PRILOSEC) 20 MG capsule TAKE 1 CAPSULE TWICE DAILY 30 MINUTES PRIOR TO BREAKFAST AND SUPPER 180 capsule 0  . polyethylene glycol-electrolytes (TRILYTE) 420 G solution Take  4,000 mLs by mouth as directed. 4000 mL 0  . Probiotic Product (ALIGN) 4 MG CAPS Take 4 mg by mouth daily as needed.     . rosuvastatin (CRESTOR) 20 MG tablet Take 1 tablet (20 mg total) by mouth daily. 30 tablet 3   No current facility-administered medications for this encounter.     Allergies  Allergen Reactions  . Doxycycline Diarrhea  . Contrast Media [Iodinated Diagnostic Agents]     Syncope  . Eszopiclone   . Lunesta [Eszopiclone]   . Morphine And Related Nausea And Vomiting    Social History   Social History  . Marital status: Married    Spouse name: N/A  . Number of children: N/A  . Years of education: N/A   Occupational History  . Not on file.   Social History Main Topics  . Smoking status: Former Smoker    Types: Cigarettes    Start date: 01/24/1950    Quit date: 01/24/1961  . Smokeless tobacco: Never Used  . Alcohol use 0.0 oz/week     Comment: not drank since 12/2009  . Drug use: No  . Sexual activity: Yes    Partners: Female   Other Topics Concern  . Not on file   Social History Narrative  . No narrative on file    Family History  Problem Relation Age of Onset  . Heart disease Mother   . Stroke Mother   . Stroke Father   . Diabetes Son   . Colon cancer Neg Hx     "not that I know of."    ROS- All systems are reviewed and negative except as per the HPI above  Physical Exam: Vitals:   04/25/16 1208  BP: (!) 146/78  Pulse: 95  Weight: 169 lb (76.7 kg)  Height: 6\' 2"  (1.88 m)   Wt Readings from Last 3 Encounters:  04/25/16 169 lb (76.7 kg)  04/11/16 171 lb (77.6 kg)  04/07/16 173 lb (78.5 kg)    Labs: Lab Results  Component Value Date   NA 136 04/08/2016   K 5.2 04/08/2016   CL 96 04/08/2016   CO2 25 04/08/2016   GLUCOSE 94 04/08/2016   BUN 10 04/08/2016   CREATININE 0.94 04/08/2016   CALCIUM 9.3 04/08/2016   Lab Results  Component Value Date   INR 0.9 11/22/2007   Lab Results  Component Value Date   CHOL 170 04/08/2016     HDL 84 04/08/2016   LDLCALC 74 04/08/2016   TRIG 62 04/08/2016     GEN- The patient is well appearing, alert and oriented x 3 today.   Head- normocephalic, atraumatic Eyes-  Sclera clear, conjunctiva pink Ears- hearing intact Oropharynx- clear Neck- supple, no JVP Lymph- no cervical lymphadenopathy Lungs- Clear to ausculation bilaterally, normal work of breathing Heart- irregular rate and rhythm, no murmurs, rubs  or gallops, PMI not laterally displaced GI- soft, NT, ND, + BS Extremities- no clubbing, cyanosis, or edema MS- no significant deformity or atrophy Skin- no rash or lesion Psych- euthymic mood, full affect Neuro- strength and sensation are intact  EKG- afib at 95 bpm, qrs int 88 ms, qtc 447 ms Epic records reviewed Echo-Study Conclusions  - Left ventricle: The cavity size was normal. Wall thickness was   normal. Systolic function was normal. The estimated ejection   fraction was in the range of 50% to 55%. Wall motion was normal;   there were no regional wall motion abnormalities. - Ventricular septum: Septal motion showed paradox. The contour   showed diastolic flattening. These changes are consistent with RV   volume overload. - Mitral valve: There was mild regurgitation. - Left atrium: The atrium was mildly dilated. - Right ventricle: The cavity size was mildly to moderately   dilated. Systolic function was reduced. - Right atrium: The atrium was moderately dilated.  Impressions:  - Right heart chamber enlargement, abnormal septal motion suggest   RV volume overload. No evidence of significant tricuspid or   pulmonary insufficiency and no visible intracardiac shunt.   Consider further evaluation for ASD or partial anomalous   pulmonary vein return if clinically indicated.   Assessment and Plan: 1. New onset afib Pt advised to avoid BC powders Tylenol if needed for pain Avoid alcohol, no more than 2 drinks a week. Continue regular  exercise Continue metoprolol  25 mg  States he has taken eliquis consistently without failed doses since 3/19. He will be scheduled for cardioversion with Dr. Johnsie Cancel 4/12 at noon, at which time he will be sufficiently loaded on eliquis Will ask Dr. Johnsie Cancel to review echo for rt sided  Changes new from previous echo  F/u here after DCCV one week   Butch Penny C. Mahkayla Preece, [facility] [address] [place], [place] [address] [phone]

## 2016-05-02 ENCOUNTER — Other Ambulatory Visit: Payer: Self-pay | Admitting: Family Medicine

## 2016-05-03 ENCOUNTER — Other Ambulatory Visit: Payer: Self-pay | Admitting: Family Medicine

## 2016-05-04 ENCOUNTER — Other Ambulatory Visit (HOSPITAL_COMMUNITY): Payer: Self-pay | Admitting: Nurse Practitioner

## 2016-05-04 ENCOUNTER — Other Ambulatory Visit: Payer: Self-pay | Admitting: Cardiovascular Disease

## 2016-05-04 DIAGNOSIS — I480 Paroxysmal atrial fibrillation: Secondary | ICD-10-CM

## 2016-05-05 ENCOUNTER — Ambulatory Visit (HOSPITAL_COMMUNITY): Payer: Medicare HMO | Admitting: Certified Registered Nurse Anesthetist

## 2016-05-05 ENCOUNTER — Encounter (HOSPITAL_COMMUNITY): Payer: Self-pay | Admitting: Certified Registered Nurse Anesthetist

## 2016-05-05 ENCOUNTER — Other Ambulatory Visit (HOSPITAL_COMMUNITY): Payer: Self-pay | Admitting: *Deleted

## 2016-05-05 ENCOUNTER — Ambulatory Visit (HOSPITAL_COMMUNITY)
Admission: RE | Admit: 2016-05-05 | Discharge: 2016-05-05 | Disposition: A | Discharge status: Home / Self Care | Payer: Medicare HMO | Source: Ambulatory Visit | Attending: Cardiovascular Disease | Admitting: Cardiovascular Disease

## 2016-05-05 ENCOUNTER — Encounter (HOSPITAL_COMMUNITY)
Admission: RE | Disposition: A | Discharge status: Home / Self Care | Payer: Self-pay | Source: Ambulatory Visit | Attending: Cardiovascular Disease

## 2016-05-05 DIAGNOSIS — Z885 Allergy status to narcotic agent status: Secondary | ICD-10-CM | POA: Insufficient documentation

## 2016-05-05 DIAGNOSIS — Z8249 Family history of ischemic heart disease and other diseases of the circulatory system: Secondary | ICD-10-CM | POA: Insufficient documentation

## 2016-05-05 DIAGNOSIS — I10 Essential (primary) hypertension: Secondary | ICD-10-CM | POA: Diagnosis not present

## 2016-05-05 DIAGNOSIS — I4891 Unspecified atrial fibrillation: Secondary | ICD-10-CM | POA: Insufficient documentation

## 2016-05-05 DIAGNOSIS — Z91041 Radiographic dye allergy status: Secondary | ICD-10-CM | POA: Diagnosis not present

## 2016-05-05 DIAGNOSIS — Z7901 Long term (current) use of anticoagulants: Secondary | ICD-10-CM | POA: Diagnosis not present

## 2016-05-05 DIAGNOSIS — F419 Anxiety disorder, unspecified: Secondary | ICD-10-CM | POA: Insufficient documentation

## 2016-05-05 DIAGNOSIS — Z951 Presence of aortocoronary bypass graft: Secondary | ICD-10-CM | POA: Insufficient documentation

## 2016-05-05 DIAGNOSIS — Z87891 Personal history of nicotine dependence: Secondary | ICD-10-CM | POA: Insufficient documentation

## 2016-05-05 DIAGNOSIS — I739 Peripheral vascular disease, unspecified: Secondary | ICD-10-CM | POA: Insufficient documentation

## 2016-05-05 DIAGNOSIS — Z7951 Long term (current) use of inhaled steroids: Secondary | ICD-10-CM | POA: Insufficient documentation

## 2016-05-05 DIAGNOSIS — E559 Vitamin D deficiency, unspecified: Secondary | ICD-10-CM | POA: Insufficient documentation

## 2016-05-05 DIAGNOSIS — I2510 Atherosclerotic heart disease of native coronary artery without angina pectoris: Secondary | ICD-10-CM | POA: Diagnosis not present

## 2016-05-05 DIAGNOSIS — I480 Paroxysmal atrial fibrillation: Secondary | ICD-10-CM

## 2016-05-05 DIAGNOSIS — I25810 Atherosclerosis of coronary artery bypass graft(s) without angina pectoris: Secondary | ICD-10-CM | POA: Diagnosis not present

## 2016-05-05 DIAGNOSIS — E7800 Pure hypercholesterolemia, unspecified: Secondary | ICD-10-CM | POA: Diagnosis not present

## 2016-05-05 DIAGNOSIS — E785 Hyperlipidemia, unspecified: Secondary | ICD-10-CM | POA: Diagnosis not present

## 2016-05-05 HISTORY — PX: CARDIOVERSION: SHX1299

## 2016-05-05 LAB — BASIC METABOLIC PANEL
ANION GAP: 8 (ref 5–15)
BUN: 9 mg/dL (ref 6–20)
CHLORIDE: 102 mmol/L (ref 101–111)
CO2: 27 mmol/L (ref 22–32)
Calcium: 9 mg/dL (ref 8.9–10.3)
Creatinine, Ser: 1.02 mg/dL (ref 0.61–1.24)
GFR calc Af Amer: >60 mL/min (ref >60)
GFR calc non Af Amer: >60 mL/min (ref >60)
GLUCOSE: 101 mg/dL — AB (ref 65–99)
POTASSIUM: 4.2 mmol/L (ref 3.5–5.1)
Sodium: 137 mmol/L (ref 135–145)

## 2016-05-05 SURGERY — CARDIOVERSION
Anesthesia: General

## 2016-05-05 MED ORDER — SODIUM CHLORIDE 0.9 % IV SOLN: 250.0000 mL | INTRAVENOUS | Status: DC

## 2016-05-05 MED ORDER — SODIUM CHLORIDE 0.9% FLUSH: 3.0000 mL | INTRAVENOUS | Status: DC | PRN

## 2016-05-05 MED ORDER — PROPOFOL 10 MG/ML IV BOLUS
INTRAVENOUS | Status: DC | PRN
  Administered 2016-05-05: 50 mg via INTRAVENOUS

## 2016-05-05 MED ORDER — SODIUM CHLORIDE 0.9% FLUSH: 3.0000 mL | Freq: Two times a day (BID) | INTRAVENOUS | Status: DC

## 2016-05-05 MED ORDER — LIDOCAINE HCL (CARDIAC) 20 MG/ML IV SOLN
INTRAVENOUS | Status: DC | PRN
  Administered 2016-05-05: 60 mg via INTRATRACHEAL

## 2016-05-05 MED ORDER — SODIUM CHLORIDE 0.9 % IV SOLN
250.0000 mL | INTRAVENOUS | Status: DC
  Administered 2016-05-05: 250 mL via INTRAVENOUS

## 2016-05-05 NOTE — CV Procedure (Signed)
[facility]: Anesthesia;  Dr Kalman Shan Propofol  [place] x 1 120 J synch converted from afib rate 110 to NSR rate 72 On Rx Eliquis  No immediate neurologic sequelae  [name], MD

## 2016-05-05 NOTE — Anesthesia Preprocedure Evaluation (Signed)
Anesthesia Evaluation  Patient identified by MRN, date of birth, ID band Patient awake    Reviewed: Allergy & Precautions, NPO status , Patient's Chart, lab work & pertinent test results  Airway Mallampati: II  TM Distance: >3 FB Neck ROM: Full    Dental no notable dental hx.    Pulmonary neg pulmonary ROS, former smoker,    Pulmonary exam normal breath sounds clear to auscultation       Cardiovascular hypertension, + CAD, + CABG and + Peripheral Vascular Disease  Normal cardiovascular exam+ dysrhythmias Atrial Fibrillation  Rhythm:Regular Rate:Normal  EF 50-55%   Neuro/Psych negative neurological ROS  negative psych ROS   GI/Hepatic negative GI ROS, Neg liver ROS,   Endo/Other  negative endocrine ROS  Renal/GU negative Renal ROS  negative genitourinary   Musculoskeletal negative musculoskeletal ROS (+)   Abdominal   Peds negative pediatric ROS (+)  Hematology negative hematology ROS (+)   Anesthesia Other Findings   Reproductive/Obstetrics negative OB ROS                             Anesthesia Physical Anesthesia Plan  ASA: III  Anesthesia Plan: General   Post-op Pain Management:    Induction: Intravenous  Airway Management Planned: Mask  Additional Equipment:   Intra-op Plan:   Post-operative Plan:   Informed Consent: I have reviewed the patients History and Physical, chart, labs and discussed the procedure including the risks, benefits and alternatives for the proposed anesthesia with the patient or authorized representative who has indicated his/her understanding and acceptance.   Dental advisory given  Plan Discussed with: CRNA and Surgeon  Anesthesia Plan Comments:         Anesthesia Quick Evaluation

## 2016-05-05 NOTE — Anesthesia Postprocedure Evaluation (Signed)
Anesthesia Post Note  Patient: Danny [name].  Procedure(s) Performed: Procedure(s) (LRB): CARDIOVERSION (N/A)  Patient location during evaluation: PACU Anesthesia Type: General Level of consciousness: awake and alert Pain management: pain level controlled Vital Signs Assessment: post-procedure vital signs reviewed and stable Respiratory status: spontaneous breathing, nonlabored ventilation, respiratory function stable and patient connected to nasal cannula oxygen Cardiovascular status: blood pressure returned to baseline and stable Postop Assessment: no signs of nausea or vomiting Anesthetic complications: no       Last Vitals:  Vitals:   05/05/16 1235 05/05/16 1245  BP: (!) 144/77 117/77  Pulse: 61 62  Resp: 20 15  Temp:      Last Pain:  Vitals:   05/05/16 1227  TempSrc: Oral                 Delphin Funes,Manav S

## 2016-05-05 NOTE — Discharge Instructions (Signed)
Electrical Cardioversion, Care After °This sheet gives you information about how to care for yourself after your procedure. Your health care provider may also give you more specific instructions. If you have problems or questions, contact your health care provider. °What can I expect after the procedure? °After the procedure, it is common to have: °· Some redness on the skin where the shocks were given. °Follow these instructions at home: °· Do not drive for 24 hours if you were given a medicine to help you relax (sedative). °· Take over-the-counter and prescription medicines only as told by your health care provider. °· Ask your health care provider how to check your pulse. Check it often. °· Rest for 48 hours after the procedure or as told by your health care provider. °· Avoid or limit your caffeine use as told by your health care provider. °Contact a health care provider if: °· You feel like your heart is beating too quickly or your pulse is not regular. °· You have a serious muscle cramp that does not go away. °Get help right away if: °· You have discomfort in your chest. °· You are dizzy or you feel faint. °· You have trouble breathing or you are short of breath. °· Your speech is slurred. °· You have trouble moving an arm or leg on one side of your body. °· Your fingers or toes turn cold or blue. °This information is not intended to replace advice given to you by your health care provider. Make sure you discuss any questions you have with your health care provider. °Document Released: 10/31/2012 Document Revised: 08/14/2015 Document Reviewed: 07/17/2015 °Elsevier Interactive Patient Education © 2017 [employer] Inc[employer] ° °

## 2016-05-05 NOTE — Interval H&P Note (Signed)
History and Physical Interval Note:  05/05/2016 10:08 AM  Danny [name].  has presented today for surgery, with the diagnosis of afib  The various methods of treatment have been discussed with the patient and family. After consideration of risks, benefits and other options for treatment, the patient has consented to  Procedure(s): CARDIOVERSION (N/A) as a surgical intervention .  The patient's history has been reviewed, patient examined, no change in status, stable for surgery.  I have reviewed the patient's chart and labs.  Questions were answered to the patient's satisfaction.     [name]

## 2016-05-05 NOTE — Transfer of Care (Signed)
Immediate Anesthesia Transfer of Care Note  Patient: Danny [name].  Procedure(s) Performed: Procedure(s): CARDIOVERSION (N/A)  Patient Location: Endoscopy Unit  Anesthesia Type:General  Level of Consciousness: awake, alert , oriented and patient cooperative  Airway & Oxygen Therapy: Patient Spontanous Breathing and Patient connected to nasal cannula oxygen  Post-op Assessment: Report given to RN and Post -op Vital signs reviewed and stable  Post vital signs: Reviewed and stable  Last Vitals:  Vitals:   05/05/16 1027  BP: (!) 167/78  Pulse: 92  Resp: 20  Temp: 36.3 C    Last Pain:  Vitals:   05/05/16 1027  TempSrc: Oral         Complications: No apparent anesthesia complications

## 2016-05-05 NOTE — H&P (View-Only) (Signed)
Primary Care Physician: Redge Gainer, MD Referring Physician: Dr. Laurance Flatten Cardiologist: Dr. Murrell Converse. is a 83 y.o. male with a h/o PVD, CAD s/p bypass that was recently found to have afib with CVR at physical. Pt is minimally symptomatic. He will feel a slight irregular hear beat at times but otherwise has not noted any change in his energy or shortness of breath with exertion. He states that he is very active, chopping wood, walking and feels well.  Review of lifestyle shows pt had been taking 3-4 BC powders a day and drinking 5-6 beers a night. He has since stopped the [facility] powders and alcohol. Some caffeine but not excessive. Wife states that he use to snore but does not believe that it as  prevalent as it once was, but she does not sleep in the same room as him now. He did not pick up the eliquis he was prescribed because it would have cost him $200. He is interested in taking warfarin but to get a timely cardioversion it would be best to start Doac first with free 30 days card and plan on transfer to warfarin later. He has no bleeding history or bleeding tendencies.   F/u in afib clinic f/u updated echo. He has now been on apixaban 5 mg bid since 3/20. He has tried to go back to walking now that weather is warmer and has noted that he does tire out more easily since in afib. Discussion of trying to return to sinus rhythm with cardioversion, risk vrs benefit explained and pt would like to proceed.weight is stable. No unusual shortness of breath, no PND or orthopnea, no pedal edema.  Today, he denies symptoms of palpitations, chest pain, shortness of breath, orthopnea, PND, lower extremity edema, dizziness, presyncope, syncope, or neurologic sequela. The patient is tolerating medications without difficulties and is otherwise without complaint today.   Past Medical History:  Diagnosis Date  . Anxiety   . C. difficile colitis   . Hyperlipidemia   . Hypertension   . Vitamin D  deficiency    Past Surgical History:  Procedure Laterality Date  . CAROTID ENDARTERECTOMY Left   . CAROTID-SUBCLAVIAN BYPASS GRAFT Right   . COLONOSCOPY  2005   hyperplastic polyp x 1 with no adenoma  . COLONOSCOPY N/A 04/11/2014   [id number] diverticulosis in the sigmoid colon/left colon is redundant  . CORONARY ARTERY BYPASS GRAFT    . ESOPHAGEAL DILATION N/A 04/11/2014   Procedure: ESOPHAGEAL DILATION;  Surgeon: [name], MD;  Location: [facility];  Service: Endoscopy;  Laterality: N/A;  . ESOPHAGOGASTRODUODENOSCOPY N/A 04/11/2014   [id number] at the gastro junction/moderate erosive/duodenal web  . SKIN CANCER EXCISION      Current Outpatient Prescriptions  Medication Sig Dispense Refill  . ALPRAZolam (XANAX) 0.25 MG tablet Take 1 Tablet by mouth at bedtime AS NEEDED 30 tablet 2  . apixaban (ELIQUIS) 5 MG TABS tablet Take 1 tablet (5 mg total) by mouth 2 (two) times daily. 60 tablet 0  . Cholecalciferol (VITAMIN D-3 PO) Take 2,000 mg by mouth every morning.     . fluticasone (FLONASE) 50 MCG/ACT nasal spray Place 2 sprays into both nostrils daily. (Patient taking differently: Place 2 sprays into both nostrils daily as needed for allergies. ) 16 g 6  . losartan (COZAAR) 50 MG tablet TAKE 1 TABLET EVERY MORNING 90 tablet 0  . lovastatin (MEVACOR) 40 MG tablet TAKE 1 TABLET EVERY MORNING 90 tablet 0  .  metoprolol tartrate (LOPRESSOR) 25 MG tablet TAKE 1/2 TABLET TWICE DAILY 90 tablet 0  . Multiple Vitamin (MULTIVITAMIN) tablet Take 1 tablet by mouth every morning.     [name] NITROSTAT 0.4 MG SL tablet PLACE 1 TABLET (0.4 MG TOTAL) UNDER THE TONGUE EVERY 5 (FIVE) MINUTES AS NEEDED FOR CHEST PAIN. 25 tablet 1  . Omega-3 Fatty Acids (FISH OIL) 1000 MG CAPS Take 2 capsules by mouth every morning.     [name] omeprazole (PRILOSEC) 20 MG capsule TAKE 1 CAPSULE TWICE DAILY 30 MINUTES PRIOR TO BREAKFAST AND SUPPER 180 capsule 0  . polyethylene glycol-electrolytes (TRILYTE) 420 G solution Take  4,000 mLs by mouth as directed. 4000 mL 0  . Probiotic Product (ALIGN) 4 MG CAPS Take 4 mg by mouth daily as needed.     . rosuvastatin (CRESTOR) 20 MG tablet Take 1 tablet (20 mg total) by mouth daily. 30 tablet 3   No current facility-administered medications for this encounter.     Allergies  Allergen Reactions  . Doxycycline Diarrhea  . Contrast Media [Iodinated Diagnostic Agents]     Syncope  . Eszopiclone   . Lunesta [Eszopiclone]   . Morphine And Related Nausea And Vomiting    Social History   Social History  . Marital status: Married    Spouse name: N/A  . Number of children: N/A  . Years of education: N/A   Occupational History  . Not on file.   Social History Main Topics  . Smoking status: Former Smoker    Types: Cigarettes    Start date: 01/24/1950    Quit date: 01/24/1961  . Smokeless tobacco: Never Used  . Alcohol use 0.0 oz/week     Comment: not drank since 12/2009  . Drug use: No  . Sexual activity: Yes    Partners: Female   Other Topics Concern  . Not on file   Social History Narrative  . No narrative on file    Family History  Problem Relation Age of Onset  . Heart disease Mother   . Stroke Mother   . Stroke Father   . Diabetes Son   . Colon cancer Neg Hx     "not that I know of."    ROS- All systems are reviewed and negative except as per the HPI above  Physical Exam: Vitals:   04/25/16 1208  BP: (!) 146/78  Pulse: 95  Weight: 169 lb (76.7 kg)  Height: 6\' 2"  (1.88 m)   Wt Readings from Last 3 Encounters:  04/25/16 169 lb (76.7 kg)  04/11/16 171 lb (77.6 kg)  04/07/16 173 lb (78.5 kg)    Labs: Lab Results  Component Value Date   NA 136 04/08/2016   K 5.2 04/08/2016   CL 96 04/08/2016   CO2 25 04/08/2016   GLUCOSE 94 04/08/2016   BUN 10 04/08/2016   CREATININE 0.94 04/08/2016   CALCIUM 9.3 04/08/2016   Lab Results  Component Value Date   INR 0.9 11/22/2007   Lab Results  Component Value Date   CHOL 170 04/08/2016     HDL 84 04/08/2016   LDLCALC 74 04/08/2016   TRIG 62 04/08/2016     GEN- The patient is well appearing, alert and oriented x 3 today.   Head- normocephalic, atraumatic Eyes-  Sclera clear, conjunctiva pink Ears- hearing intact Oropharynx- clear Neck- supple, no JVP Lymph- no cervical lymphadenopathy Lungs- Clear to ausculation bilaterally, normal work of breathing Heart- irregular rate and rhythm, no murmurs, rubs  or gallops, PMI not laterally displaced GI- soft, NT, ND, + BS Extremities- no clubbing, cyanosis, or edema MS- no significant deformity or atrophy Skin- no rash or lesion Psych- euthymic mood, full affect Neuro- strength and sensation are intact  EKG- afib at 95 bpm, qrs int 88 ms, qtc 447 ms Epic records reviewed Echo-Study Conclusions  - Left ventricle: The cavity size was normal. Wall thickness was   normal. Systolic function was normal. The estimated ejection   fraction was in the range of 50% to 55%. Wall motion was normal;   there were no regional wall motion abnormalities. - Ventricular septum: Septal motion showed paradox. The contour   showed diastolic flattening. These changes are consistent with RV   volume overload. - Mitral valve: There was mild regurgitation. - Left atrium: The atrium was mildly dilated. - Right ventricle: The cavity size was mildly to moderately   dilated. Systolic function was reduced. - Right atrium: The atrium was moderately dilated.  Impressions:  - Right heart chamber enlargement, abnormal septal motion suggest   RV volume overload. No evidence of significant tricuspid or   pulmonary insufficiency and no visible intracardiac shunt.   Consider further evaluation for ASD or partial anomalous   pulmonary vein return if clinically indicated.   Assessment and Plan: 1. New onset afib Pt advised to avoid BC powders Tylenol if needed for pain Avoid alcohol, no more than 2 drinks a week. Continue regular  exercise Continue metoprolol  25 mg  States he has taken eliquis consistently without failed doses since 3/19. He will be scheduled for cardioversion with Dr. Johnsie Cancel 4/12 at noon, at which time he will be sufficiently loaded on eliquis Will ask Dr. Johnsie Cancel to review echo for rt sided  Changes new from previous echo  F/u here after DCCV one week   Danny [name], [facility] [address] [place], [place] [address] [phone]

## 2016-05-06 ENCOUNTER — Encounter (HOSPITAL_COMMUNITY): Payer: Self-pay | Admitting: Cardiovascular Disease

## 2016-05-09 LAB — POCT I-STAT 4, (NA,K, GLUC, HGB,HCT)
GLUCOSE: 102 mg/dL — AB (ref 65–99)
HEMATOCRIT: 44 % (ref 39.0–52.0)
HEMOGLOBIN: 15 g/dL (ref 13.0–17.0)
SODIUM: 133 mmol/L — AB (ref 135–145)

## 2016-05-12 ENCOUNTER — Ambulatory Visit (HOSPITAL_COMMUNITY)
Admission: RE | Admit: 2016-05-12 | Discharge: 2016-05-12 | Disposition: A | Discharge status: Home / Self Care | Payer: Medicare HMO | Source: Ambulatory Visit | Attending: Nurse Practitioner | Admitting: Nurse Practitioner

## 2016-05-12 ENCOUNTER — Encounter (HOSPITAL_COMMUNITY): Payer: Self-pay | Admitting: Nurse Practitioner

## 2016-05-12 VITALS — BP 130/68 | HR 59 | Ht 74.0 in | Wt 167.0 lb

## 2016-05-12 DIAGNOSIS — I481 Persistent atrial fibrillation: Secondary | ICD-10-CM

## 2016-05-12 DIAGNOSIS — I10 Essential (primary) hypertension: Secondary | ICD-10-CM | POA: Diagnosis not present

## 2016-05-12 DIAGNOSIS — Z833 Family history of diabetes mellitus: Secondary | ICD-10-CM | POA: Insufficient documentation

## 2016-05-12 DIAGNOSIS — I4891 Unspecified atrial fibrillation: Secondary | ICD-10-CM | POA: Diagnosis not present

## 2016-05-12 DIAGNOSIS — Z8249 Family history of ischemic heart disease and other diseases of the circulatory system: Secondary | ICD-10-CM | POA: Diagnosis not present

## 2016-05-12 DIAGNOSIS — Z87891 Personal history of nicotine dependence: Secondary | ICD-10-CM | POA: Diagnosis not present

## 2016-05-12 DIAGNOSIS — A0472 Enterocolitis due to Clostridium difficile, not specified as recurrent: Secondary | ICD-10-CM | POA: Diagnosis not present

## 2016-05-12 DIAGNOSIS — Z7901 Long term (current) use of anticoagulants: Secondary | ICD-10-CM | POA: Diagnosis not present

## 2016-05-12 DIAGNOSIS — F419 Anxiety disorder, unspecified: Secondary | ICD-10-CM | POA: Insufficient documentation

## 2016-05-12 DIAGNOSIS — Z888 Allergy status to other drugs, medicaments and biological substances status: Secondary | ICD-10-CM | POA: Diagnosis not present

## 2016-05-12 DIAGNOSIS — E559 Vitamin D deficiency, unspecified: Secondary | ICD-10-CM | POA: Diagnosis not present

## 2016-05-12 DIAGNOSIS — K228 Other specified diseases of esophagus: Secondary | ICD-10-CM | POA: Diagnosis not present

## 2016-05-12 DIAGNOSIS — E785 Hyperlipidemia, unspecified: Secondary | ICD-10-CM | POA: Diagnosis not present

## 2016-05-12 DIAGNOSIS — Z79899 Other long term (current) drug therapy: Secondary | ICD-10-CM | POA: Diagnosis not present

## 2016-05-12 DIAGNOSIS — Z9889 Other specified postprocedural states: Secondary | ICD-10-CM | POA: Insufficient documentation

## 2016-05-12 DIAGNOSIS — I4819 Other persistent atrial fibrillation: Secondary | ICD-10-CM

## 2016-05-12 DIAGNOSIS — Z85828 Personal history of other malignant neoplasm of skin: Secondary | ICD-10-CM | POA: Insufficient documentation

## 2016-05-12 DIAGNOSIS — Z823 Family history of stroke: Secondary | ICD-10-CM | POA: Diagnosis not present

## 2016-05-12 NOTE — Progress Notes (Signed)
Primary Care Physician: Redge Gainer, MD Referring Physician: Dr. Laurance Flatten Cardiologist: Dr. Murrell Converse. is a 83 y.o. male with a h/o PVD, CAD s/p bypass that was recently found to have afib with CVR at physical. Pt is minimally symptomatic. He will feel a slight irregular hear beat at times but otherwise has not noted any change in his energy or shortness of breath with exertion. He states that he is very active, chopping wood, walking and feels well.  Review of lifestyle shows pt had been taking 3-4 BC powders a day and drinking 5-6 beers a night. He has since stopped the [facility] powders and alcohol. Some caffeine but not excessive. Wife states that he use to snore but does not believe that it as  prevalent as it once was, but she does not sleep in the same room as him now. He did not pick up the eliquis he was prescribed because it would have cost him $200. He is interested in taking warfarin but to get a timely cardioversion it would be best to start Doac first with free 30 days card and plan on transfer to warfarin later. He has no bleeding history or bleeding tendencies.   F/u in afib clinic f/u updated echo. He has now been on apixaban 5 mg bid since 3/20. He has tried to go back to walking now that weather is warmer and has noted that he does tire out more easily since in afib. Discussion of trying to return to sinus rhythm with cardioversion, risk vrs benefit explained and pt would like to proceed.weight is stable. No unusual shortness of breath, no PND or orthopnea, no pedal edema.  F/u in afib clinic, after successful cardioversion. He feels much better.HE is staying in SR. He has stopped the [facility] powders and is no longer drinking beer. Continues with apixaban without missed doses, but may have to transition over to coumadin for cost issues in the next 1-2 months.  Today, he denies symptoms of palpitations, chest pain, shortness of breath, orthopnea, PND, lower extremity edema,  dizziness, presyncope, syncope, or neurologic sequela. The patient is tolerating medications without difficulties and is otherwise without complaint today.   Past Medical History:  Diagnosis Date  . Anxiety   . C. difficile colitis   . Hyperlipidemia   . Hypertension   . Vitamin D deficiency    Past Surgical History:  Procedure Laterality Date  . CARDIOVERSION N/A 05/05/2016   Procedure: CARDIOVERSION;  Surgeon: Josue Hector, MD;  Location: [facility] ENDOSCOPY;  Service: Cardiovascular;  Laterality: N/A;  . CAROTID ENDARTERECTOMY Left   . CAROTID-SUBCLAVIAN BYPASS GRAFT Right   . COLONOSCOPY  2005   hyperplastic polyp x 1 with no adenoma  . COLONOSCOPY N/A 04/11/2014   OXB:DZHG diverticulosis in the sigmoid colon/left colon is redundant  . CORONARY ARTERY BYPASS GRAFT    . ESOPHAGEAL DILATION N/A 04/11/2014   Procedure: ESOPHAGEAL DILATION;  Surgeon: [name], MD;  Location: [facility] ENDO SUITE;  Service: Endoscopy;  Laterality: N/A;  . ESOPHAGOGASTRODUODENOSCOPY N/A 04/11/2014   [id number] at the gastro junction/moderate erosive/duodenal web  . SKIN CANCER EXCISION      Current Outpatient Prescriptions  Medication Sig Dispense Refill  . ALPRAZolam (XANAX) 0.25 MG tablet Take 1 Tablet by mouth at bedtime AS NEEDED 30 tablet 2  . apixaban (ELIQUIS) 5 MG TABS tablet Take 1 tablet (5 mg total) by mouth 2 (two) times daily. 60 tablet 0  . Cholecalciferol (VITAMIN D-3  PO) Take 2,000 mg by mouth every morning.     . fluticasone (FLONASE) 50 MCG/ACT nasal spray Place 2 sprays into both nostrils daily. (Patient taking differently: Place 2 sprays into both nostrils daily as needed for allergies. ) 16 g 6  . losartan (COZAAR) 50 MG tablet TAKE 1 TABLET EVERY MORNING 90 tablet 1  . lovastatin (MEVACOR) 40 MG tablet TAKE 1 TABLET EVERY MORNING 90 tablet 0  . metoprolol tartrate (LOPRESSOR) 25 MG tablet TAKE 1/2 TABLET TWICE DAILY 90 tablet 1  . Multiple Vitamin (MULTIVITAMIN) tablet Take 1 tablet  by mouth every morning.     [name] NITROSTAT 0.4 MG SL tablet PLACE 1 TABLET (0.4 MG TOTAL) UNDER THE TONGUE EVERY 5 (FIVE) MINUTES AS NEEDED FOR CHEST PAIN. 25 tablet 1  . Omega-3 Fatty Acids (FISH OIL) 1000 MG CAPS Take 2 capsules by mouth every morning.     [name] omeprazole (PRILOSEC) 20 MG capsule TAKE 1 CAPSULE TWICE DAILY 30 MINUTES PRIOR TO BREAKFAST AND SUPPER 180 capsule 1  . Probiotic Product (ALIGN) 4 MG CAPS Take 4 mg by mouth daily as needed.     . rosuvastatin (CRESTOR) 20 MG tablet Take 1 tablet (20 mg total) by mouth daily. 30 tablet 3   No current facility-administered medications for this encounter.     Allergies  Allergen Reactions  . Doxycycline Diarrhea  . Contrast Media [Iodinated Diagnostic Agents]     Syncope  . Eszopiclone   . Lunesta [Eszopiclone]   . Morphine And Related Nausea And Vomiting    Social History   Social History  . Marital status: Married    Spouse name: N/A  . Number of children: N/A  . Years of education: N/A   Occupational History  . Not on file.   Social History Main Topics  . Smoking status: Former Smoker    Types: Cigarettes    Start date: 01/24/1950    Quit date: 01/24/1961  . Smokeless tobacco: Never Used  . Alcohol use 0.0 oz/week     Comment: not drank since 12/2009  . Drug use: No  . Sexual activity: Yes    Partners: Female   Other Topics Concern  . Not on file   Social History Narrative  . No narrative on file    Family History  Problem Relation Age of Onset  . Heart disease Mother   . Stroke Mother   . Stroke Father   . Diabetes Son   . Colon cancer Neg Hx     "not that I know of."    ROS- All systems are reviewed and negative except as per the HPI above  Physical Exam: Vitals:   05/12/16 1018  BP: 130/68  Pulse: (!) 59  Weight: 167 lb (75.8 kg)  Height: 6\' 2"  (1.88 m)   Wt Readings from Last 3 Encounters:  05/12/16 167 lb (75.8 kg)  05/05/16 169 lb (76.7 kg)  04/25/16 169 lb (76.7 kg)    Labs: Lab  Results  Component Value Date   NA 137 05/05/2016   K 4.2 05/05/2016   CL 102 05/05/2016   CO2 27 05/05/2016   GLUCOSE 101 (H) 05/05/2016   BUN 9 05/05/2016   CREATININE 1.02 05/05/2016   CALCIUM 9.0 05/05/2016   Lab Results  Component Value Date   INR 0.9 11/22/2007   Lab Results  Component Value Date   CHOL 170 04/08/2016   HDL 84 04/08/2016   LDLCALC 74 04/08/2016   TRIG 62  04/08/2016     GEN- The patient is well appearing, alert and oriented x 3 today.   Head- normocephalic, atraumatic Eyes-  Sclera clear, conjunctiva pink Ears- hearing intact Oropharynx- clear Neck- supple, no JVP Lymph- no cervical lymphadenopathy Lungs- Clear to ausculation bilaterally, normal work of breathing Heart- regular rate and rhythm, no murmurs, rubs or gallops, PMI not laterally displaced GI- soft, NT, ND, + BS Extremities- no clubbing, cyanosis, or edema MS- no significant deformity or atrophy Skin- no rash or lesion Psych- euthymic mood, full affect Neuro- strength and sensation are intact  EKG- SR  at 59 bpm, pr int 122 ms qrs int 86 ms, qtc 431 ms Epic records reviewed Echo-Study Conclusions  - Left ventricle: The cavity size was normal. Wall thickness was   normal. Systolic function was normal. The estimated ejection   fraction was in the range of 50% to 55%. Wall motion was normal;   there were no regional wall motion abnormalities. - Ventricular septum: Septal motion showed paradox. The contour   showed diastolic flattening. These changes are consistent with RV   volume overload. - Mitral valve: There was mild regurgitation. - Left atrium: The atrium was mildly dilated. - Right ventricle: The cavity size was mildly to moderately   dilated. Systolic function was reduced. - Right atrium: The atrium was moderately dilated.  Impressions:  - Right heart chamber enlargement, abnormal septal motion suggest   RV volume overload. No evidence of significant tricuspid or    pulmonary insufficiency and no visible intracardiac shunt.   Consider further evaluation for ASD or partial anomalous   pulmonary vein return if clinically indicated.   Assessment and Plan: 1. New onset afib Pt advised to avoid BC powders Tylenol if needed for pain Avoid alcohol, no more than 2 drinks a week. Continue regular exercise Continue metoprolol  25 mg, 1/2 tab a day  States he has taken eliquis consistently without failed doses since 3/19, may want to transition to coumadin when current xarelto runs out, around 2 months. If so, he plans to go to PCP where his wife has her coumadin followed.   F/u with Dr. Johnsie Cancel in 3 months in [place] office afib clinic as needed  Danny [name]. Danny [name], [facility] [address] [place],  [address] [phone]

## 2016-05-12 NOTE — Progress Notes (Signed)
error 

## 2016-05-16 ENCOUNTER — Other Ambulatory Visit: Payer: Self-pay | Admitting: Family Medicine

## 2016-05-20 ENCOUNTER — Telehealth: Payer: Self-pay | Admitting: Family Medicine

## 2016-05-20 MED ORDER — NITROGLYCERIN 0.4 MG SL SUBL: SUBLINGUAL_TABLET | SUBLINGUAL | 1 refills | Status: DC

## 2016-05-20 NOTE — Telephone Encounter (Signed)
done

## 2016-06-16 ENCOUNTER — Other Ambulatory Visit: Payer: Self-pay | Admitting: Cardiovascular Disease

## 2016-06-27 NOTE — Anesthesia Postprocedure Evaluation (Signed)
Anesthesia Post Note  Patient: Danny [name].  Procedure(s) Performed: Procedure(s) (LRB): CARDIOVERSION (N/A)     Anesthesia Post Evaluation  Last Vitals:  Vitals:   05/05/16 1235 05/05/16 1245  BP: (!) 144/77 117/77  Pulse: 61 62  Resp: 20 15  Temp:      Last Pain:  Vitals:   05/05/16 1227  TempSrc: Oral                 [name],Edwar S

## 2016-06-27 NOTE — Addendum Note (Signed)
Addendum  created 06/27/16 1315 by Myrtie Soman, MD   Sign clinical note

## 2016-07-04 ENCOUNTER — Telehealth: Payer: Self-pay | Admitting: Family Medicine

## 2016-07-04 NOTE — Telephone Encounter (Signed)
Patient interested in switching from Eliquis to warfarin.  He has about 2 weeks of Eliquis left. He would like July appt with Dr Laurance Flatten moved up.  Appt moved to 07/20/16 at 10am.   Recommended switch to warfarin to Eliquis - stop Eliquis and start warfarin 5mg  qd.  Check INR in 3 days.  During this time Lovenox is recommended until INR 2.0 or above.   Another option is to switch to Xarelto but patient did not like idea of higher expense. If he changes his mind then to switch from Eliquis to Xarelto - stopped Eliquis and then start Xarelto 20mg  qpm next day in evening with evening meal Est Cr Cl  = 58.

## 2016-07-04 NOTE — Telephone Encounter (Signed)
Discuss with clinical pharmacy and switched to Xarelto and see if he tolerates this better.

## 2016-07-14 ENCOUNTER — Other Ambulatory Visit: Payer: Self-pay | Admitting: Cardiovascular Disease

## 2016-07-14 ENCOUNTER — Other Ambulatory Visit: Payer: Medicare HMO

## 2016-07-14 ENCOUNTER — Other Ambulatory Visit: Payer: Self-pay | Admitting: Family Medicine

## 2016-07-14 DIAGNOSIS — I10 Essential (primary) hypertension: Secondary | ICD-10-CM

## 2016-07-14 DIAGNOSIS — E7800 Pure hypercholesterolemia, unspecified: Secondary | ICD-10-CM | POA: Diagnosis not present

## 2016-07-15 LAB — CBC WITH DIFFERENTIAL/PLATELET
Basophils Absolute: 0 10*3/uL (ref 0.0–0.2)
Basos: 1 %
EOS (ABSOLUTE): 0.2 10*3/uL (ref 0.0–0.4)
Eos: 3 %
HEMOGLOBIN: 13.7 g/dL (ref 13.0–17.7)
Hematocrit: 40.9 % (ref 37.5–51.0)
IMMATURE GRANS (ABS): 0 10*3/uL (ref 0.0–0.1)
Immature Granulocytes: 0 %
LYMPHS: 27 %
Lymphocytes Absolute: 1.6 10*3/uL (ref 0.7–3.1)
MCH: 31.6 pg (ref 26.6–33.0)
MCHC: 33.5 g/dL (ref 31.5–35.7)
MCV: 95 fL (ref 79–97)
MONOCYTES: 10 %
Monocytes Absolute: 0.6 10*3/uL (ref 0.1–0.9)
Neutrophils Absolute: 3.5 10*3/uL (ref 1.4–7.0)
Neutrophils: 59 %
PLATELETS: 255 10*3/uL (ref 150–379)
RBC: 4.33 x10E6/uL (ref 4.14–5.80)
RDW: 14.5 % (ref 12.3–15.4)
WBC: 5.9 10*3/uL (ref 3.4–10.8)

## 2016-07-15 LAB — HEPATIC FUNCTION PANEL
ALK PHOS: 72 IU/L (ref 39–117)
ALT: 21 IU/L (ref 0–44)
AST: 22 IU/L (ref 0–40)
Albumin: 3.9 g/dL (ref 3.5–4.7)
BILIRUBIN TOTAL: 0.5 mg/dL (ref 0.0–1.2)
Bilirubin, Direct: 0.17 mg/dL (ref 0.00–0.40)
Total Protein: 6.2 g/dL (ref 6.0–8.5)

## 2016-07-15 LAB — BMP8+EGFR
BUN/Creatinine Ratio: 10 (ref 10–24)
BUN: 9 mg/dL (ref 8–27)
CALCIUM: 9 mg/dL (ref 8.6–10.2)
CHLORIDE: 101 mmol/L (ref 96–106)
CO2: 26 mmol/L (ref 20–29)
Creatinine, Ser: 0.92 mg/dL (ref 0.76–1.27)
GFR calc Af Amer: 89 mL/min/{1.73_m2} (ref >59)
GFR calc non Af Amer: 77 mL/min/{1.73_m2} (ref >59)
Glucose: 94 mg/dL (ref 65–99)
POTASSIUM: 4 mmol/L (ref 3.5–5.2)
Sodium: 141 mmol/L (ref 134–144)

## 2016-07-15 LAB — LIPID PANEL
CHOLESTEROL TOTAL: 177 mg/dL (ref 100–199)
Chol/HDL Ratio: 3.1 ratio (ref 0.0–5.0)
HDL: 58 mg/dL (ref >39)
LDL CALC: 102 mg/dL — AB (ref 0–99)
TRIGLYCERIDES: 86 mg/dL (ref 0–149)
VLDL CHOLESTEROL CAL: 17 mg/dL (ref 5–40)

## 2016-07-20 ENCOUNTER — Ambulatory Visit (INDEPENDENT_AMBULATORY_CARE_PROVIDER_SITE_OTHER): Payer: Medicare HMO | Admitting: Family Medicine

## 2016-07-20 ENCOUNTER — Ambulatory Visit (INDEPENDENT_AMBULATORY_CARE_PROVIDER_SITE_OTHER): Payer: Medicare HMO

## 2016-07-20 ENCOUNTER — Encounter: Payer: Self-pay | Admitting: Family Medicine

## 2016-07-20 VITALS — BP 164/78 | HR 51 | Temp 97.0°F | Ht 74.0 in | Wt 164.0 lb

## 2016-07-20 DIAGNOSIS — I480 Paroxysmal atrial fibrillation: Secondary | ICD-10-CM

## 2016-07-20 DIAGNOSIS — I10 Essential (primary) hypertension: Secondary | ICD-10-CM | POA: Diagnosis not present

## 2016-07-20 DIAGNOSIS — M2350 Chronic instability of knee, unspecified knee: Secondary | ICD-10-CM

## 2016-07-20 DIAGNOSIS — E559 Vitamin D deficiency, unspecified: Secondary | ICD-10-CM

## 2016-07-20 DIAGNOSIS — I25810 Atherosclerosis of coronary artery bypass graft(s) without angina pectoris: Secondary | ICD-10-CM

## 2016-07-20 DIAGNOSIS — N400 Enlarged prostate without lower urinary tract symptoms: Secondary | ICD-10-CM

## 2016-07-20 DIAGNOSIS — M25562 Pain in left knee: Secondary | ICD-10-CM

## 2016-07-20 DIAGNOSIS — R0989 Other specified symptoms and signs involving the circulatory and respiratory systems: Secondary | ICD-10-CM

## 2016-07-20 DIAGNOSIS — I714 Abdominal aortic aneurysm, without rupture, unspecified: Secondary | ICD-10-CM

## 2016-07-20 DIAGNOSIS — D75839 Thrombocytosis, unspecified: Secondary | ICD-10-CM

## 2016-07-20 DIAGNOSIS — D473 Essential (hemorrhagic) thrombocythemia: Secondary | ICD-10-CM | POA: Diagnosis not present

## 2016-07-20 DIAGNOSIS — I499 Cardiac arrhythmia, unspecified: Secondary | ICD-10-CM | POA: Diagnosis not present

## 2016-07-20 DIAGNOSIS — I739 Peripheral vascular disease, unspecified: Secondary | ICD-10-CM | POA: Diagnosis not present

## 2016-07-20 DIAGNOSIS — I700 Atherosclerosis of aorta: Secondary | ICD-10-CM

## 2016-07-20 DIAGNOSIS — E7800 Pure hypercholesterolemia, unspecified: Secondary | ICD-10-CM

## 2016-07-20 DIAGNOSIS — M25561 Pain in right knee: Secondary | ICD-10-CM | POA: Diagnosis not present

## 2016-07-20 NOTE — Progress Notes (Signed)
Subjective:    Patient ID: Danny Loron., male    DOB: 01-17-1932, 83 y.o.   MRN: [id number]  HPI Pt here for follow up and management of chronic medical problems which includes hyperlipidemia and hypertension. He is taking medication regularly.The patient would like to change from his novel blood thinner back to warfarin which is free for him. The eliquis cost $300. He also complains of bilateral knee pain. He has had lab work done and this will be reviewed with him during the visit today. He will be given an FOBT to return. He is followed by his cardiologist Dr. Johnsie Cancel. He is had lab work done and this will be reviewed with him during the visit today. A traditional lipid panel have an LDL C that was elevated at 102 and should be less than 70. The HDL and triglycerides were good. The blood sugar was good renal function was good and all electrolytes were good including potassium. The CBC was within normal limits. All liver function tests were normal. The patient has not been able to exercise regularly because he feels like both knees give way when he is walking. We will arrange for him to see the clinical pharmacist so she can get him off of the novel blood thinner and onto warfarin in the next week. He's been taking the blood thinner because of his history of atrial fibrillation but has not had any atrial fib that he is aware of in over a year when he was cardioverted. He does see the cardiologist regularly and has plans to see him again soon. He denies any symptoms of atrial fibrillation. The patient denies any chest pain or shortness of breath. He says his intestinal tract is working well with no nausea vomiting diarrhea blood in the stool or black tarry bowel movements. He does have some trouble with passing his water at times and it is slower especially during the day.     Patient Active Problem List   Diagnosis Date Noted  . Hematest positive stools   . Dysphagia, pharyngoesophageal phase     . Heme + stool 02/27/2014  . Constipation 02/27/2014  . Dysphagia 02/27/2014  . Thoracic aorta atherosclerosis ([place]) 01/14/2014  . 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 Bilateral carotid bruits 05/27/2013  . Enteritis due to Clostridium difficile, history of 11/21/2012  . Hyperplasia of prostate 05/18/2010  . Carotid stenosis 05/18/2010  . CAROTID BRUIT 01/06/2010  . HYPERCHOLESTEROLEMIA  IIA 05/19/2008  . SYNCOPE-CAROTID SINUS 05/19/2008  . BLURRED VISION 05/19/2008  . Essential hypertension 05/19/2008  . CAD, ARTERY BYPASS GRAFT 05/19/2008  . 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 ALPRAZolam (XANAX) 0.25 MG tablet Take 1 Tablet by mouth at bedtime AS NEEDED  . apixaban (ELIQUIS) 5 MG TABS tablet Take 1 tablet (5 mg total) by mouth 2 (two) times daily.  . Cholecalciferol (VITAMIN D-3 PO) Take 2,000 mg by mouth every morning.   . fluticasone (FLONASE) 50 MCG/ACT nasal spray Place 2 sprays into both nostrils daily. (Patient taking differently: Place 2 sprays into both nostrils daily as needed for allergies. )  . losartan (COZAAR) 50 MG tablet TAKE 1 TABLET EVERY MORNING  . lovastatin (MEVACOR) 40 MG tablet TAKE 1 TABLET EVERY MORNING  (NEED OFFICE VISIT)  . metoprolol tartrate (LOPRESSOR) 25 MG tablet TAKE 1/2 TABLET TWICE DAILY  . Multiple Vitamin (MULTIVITAMIN) tablet Take 1 tablet by  mouth every morning.   . nitroGLYCERIN (NITROSTAT) 0.4 MG SL tablet PLACE 1 TABLET (0.4 MG TOTAL) UNDER THE TONGUE EVERY 5 (FIVE) MINUTES AS NEEDED FOR CHEST PAIN.  [name] Omega-3 Fatty Acids (FISH OIL) 1000 MG CAPS Take 2 capsules by mouth every morning.   [name] omeprazole (PRILOSEC) 20 MG capsule TAKE 1 CAPSULE TWICE DAILY 30 MINUTES PRIOR TO BREAKFAST AND SUPPER  . Probiotic Product (ALIGN) 4 MG CAPS Take 4 mg by mouth daily as needed.   . rosuvastatin (CRESTOR) 20 MG tablet Take 1 tablet (20 mg total) by mouth  daily.   No facility-administered encounter medications on file as of 07/20/2016.      Review of Systems  Constitutional: Negative.   HENT: Negative.   Eyes: Negative.   Respiratory: Negative.   Cardiovascular: Negative.   Gastrointestinal: Negative.   Endocrine: Negative.   Genitourinary: Negative.   Musculoskeletal: Positive for arthralgias (bilateral knee pain).  Skin: Negative.        Lesions on face  Allergic/Immunologic: Negative.   Neurological: Negative.   Hematological: Negative.   Psychiatric/Behavioral: Negative.        Objective:   Physical Exam  Constitutional: He is oriented to person, place, and time. He appears well-developed and well-nourished. No distress.  The patient is pleasant and alert with the biggest concern being his instability with both knees  HENT:  Head: Normocephalic and atraumatic.  Right Ear: External ear normal.  Left Ear: External ear normal.  Nose: Nose normal.  Mouth/Throat: Oropharynx is clear and moist. No oropharyngeal exudate.  Eyes: Conjunctivae and EOM are normal. Pupils are equal, round, and reactive to light. Right eye exhibits no discharge. Left eye exhibits no discharge. No scleral icterus.  Neck: Normal range of motion. Neck supple. No thyromegaly present.  Bilateral carotid bruits right greater than left  Cardiovascular: Normal rate, regular rhythm and normal heart sounds.   No murmur heard. Distal pulses were difficult to palpate but there were good inguinal pulses bilaterally. The heart is regular at 60/m  Pulmonary/Chest: Effort normal and breath sounds normal. No respiratory distress. He has no wheezes. He has no rales. He exhibits no tenderness.  Clear anteriorly and posteriorly and no axillary adenopathy or chest wall tenderness  Abdominal: Soft. Bowel sounds are normal. He exhibits no mass. There is no tenderness. There is no rebound and no guarding.  No abdominal tenderness masses or organ enlargement or bruits.  Inguinal pulses were palpable without inguinal nodes.  Musculoskeletal: Normal range of motion. He exhibits no edema.  No joint line tenderness on either knee. Good flexion and extension. Once again the problem is noticed with the patient in particular is climbing up or going down and this is when his knees give way and he has pain in both knees when this happens.  Lymphadenopathy:    He has no cervical adenopathy.  Neurological: He is alert and oriented to person, place, and time. He has normal reflexes. No cranial nerve deficit.  Skin: Skin is warm and dry. No rash noted.  Psychiatric: He has a normal mood and affect. His behavior is normal. Judgment and thought content normal.  Nursing note and vitals reviewed.  BP (!) 154/75 (BP Location: Left Arm)   Pulse (!) 51   Temp 97 F (36.1 C) (Oral)   Ht 6\' 2"  (1.88 m)   Wt 164 lb (74.4 kg)   BMI 21.06 kg/m        Assessment & Plan:  1. HYPERCHOLESTEROLEMIA  IIA -  The LDL C was elevated on the most recent blood work and the patient attributes this to his inability to walk as regularly because of his knee instability.  2. Atherosclerosis of coronary artery bypass graft of native heart without angina pectoris -Continue to follow-up with cardiology  3. Thoracic aorta atherosclerosis ([place]) -Continue with aggressive therapeutic lifestyle changes as much as possible  4. Essential hypertension -The blood pressure is elevated more today than usual. The patient says that his home blood pressure readings are better than what he has in the office. It was checked twice during the visit today. He knows to watch his sodium intake more closely and he will bring his monitor to the office in a couple weeks for recheck of the blood pressure with some additional readings.  5. Vitamin D deficiency -Continue current treatment  6. Benign prostatic hyperplasia, unspecified whether lower urinary tract symptoms present -The patient is slow with voiding  during the day.  7. Irregular heart beat -No complaints or awareness by the patient of any irregular heartbeat. The rate today was 72/m and regular.  8. Recurrent knee instability, unspecified laterality -This is a new problem. We will schedule him to see the orthopedic surgeon after getting x-rays today. - DG Knee Bilateral Standing AP; Future  9. Pain in both knees, unspecified chronicity -The pain is only occurring when he feels like the knee is going to give way. - DG Knee Bilateral Standing AP; Future  10. Peripheral vascular insufficiency ([place]) -Follow-up with cardiology as planned  11. Thrombocytosis ([place])  12. AAA (abdominal aortic aneurysm) without rupture ([place]) -Follow-up with cardiology as planned  13. Paroxysmal atrial fibrillation ([facility]) -The patient is in normal sinus rhythm today and will follow-up with cardiology  14. Bilateral carotid bruits -Periodic Dopplers were done on his neck and this will happen hopefully at the next visit with the cardiologist as well as checking his lower extremity circulation.  Patient Instructions                       Medicare Annual Wellness Visit  [facility] and the medical providers at [facility] strive to bring you the best medical care.  In doing so we not only want to address your current medical conditions and concerns but also to detect new conditions early and prevent illness, disease and health-related problems.    Medicare offers a yearly Wellness Visit which allows our clinical staff to assess your need for preventative services including immunizations, lifestyle education, counseling to decrease risk of preventable diseases and screening for fall risk and other medical concerns.    This visit is provided free of charge (no copay) for all Medicare recipients. The clinical pharmacists at [facility] have begun to conduct these Wellness Visits which will also include a thorough  review of all your medications.    As you primary medical provider recommend that you make an appointment for your Annual Wellness Visit if you have not done so already this year.  You may set up this appointment before you leave today or you may call back ([phone]) and schedule an appointment.  Please make sure when you call that you mention that you are scheduling your Annual Wellness Visit with the clinical pharmacist so that the appointment may be made for the proper length of time.     Continue current medications. Continue good therapeutic lifestyle changes which include good diet and exercise. Fall precautions discussed with patient.  If an FOBT was given today- please return it to our front desk. If you are over 58 years old - you may need Prevnar 76 or the adult Pneumonia vaccine.  **Flu shots are available--- please call and schedule a FLU-CLINIC appointment**  After your visit with [place] today you will receive a survey in the mail or online from [employer] regarding your care with [place]. Please take a moment to fill this out. Your feedback is very important to [place] as you can help [place] better understand your patient needs as well as improve your experience and satisfaction. WE CARE ABOUT YOU!!!   We will schedule you for an appointment with our clinical pharmacist to adjust going off of the novell blood thinner agent to warfarin. The patient will call and make an appointment with his cardiologist as he knows this appointment is due We will also schedule him for an appointment with orthopedic surgeon because of the knee instability In the meantime he should not be doing any climbing. Once the knees are better he will try to her repeat implement his walking exercises to help reduce his cholesterol as this is always worked for him in the past. The patient will bring his blood pressure monitor by in a couple weeks for recheck of blood pressure here in this office by our nurse and with his  monitor. He should watch his sodium intake closely.   Arrie Senate MD

## 2016-07-20 NOTE — Patient Instructions (Addendum)
Medicare Annual Wellness Visit  Floyd and the medical providers at [facility] strive to bring you the best medical care.  In doing so we not only want to address your current medical conditions and concerns but also to detect new conditions early and prevent illness, disease and health-related problems.    Medicare offers a yearly Wellness Visit which allows our clinical staff to assess your need for preventative services including immunizations, lifestyle education, counseling to decrease risk of preventable diseases and screening for fall risk and other medical concerns.    This visit is provided free of charge (no copay) for all Medicare recipients. The clinical pharmacists at [facility] have begun to conduct these Wellness Visits which will also include a thorough review of all your medications.    As you primary medical provider recommend that you make an appointment for your Annual Wellness Visit if you have not done so already this year.  You may set up this appointment before you leave today or you may call back ([phone]) and schedule an appointment.  Please make sure when you call that you mention that you are scheduling your Annual Wellness Visit with the clinical pharmacist so that the appointment may be made for the proper length of time.     Continue current medications. Continue good therapeutic lifestyle changes which include good diet and exercise. Fall precautions discussed with patient. If an FOBT was given today- please return it to our front desk. If you are over 27 years old - you may need Prevnar 15 or the adult Pneumonia vaccine.  **Flu shots are available--- please call and schedule a FLU-CLINIC appointment**  After your visit with [place] today you will receive a survey in the mail or online from [employer] regarding your care with [place]. Please take a moment to fill this out. Your feedback is very  important to [place] as you can help [place] better understand your patient needs as well as improve your experience and satisfaction. WE CARE ABOUT YOU!!!   We will schedule you for an appointment with our clinical pharmacist to adjust going off of the novell blood thinner agent to warfarin. The patient will call and make an appointment with his cardiologist as he knows this appointment is due We will also schedule him for an appointment with orthopedic surgeon because of the knee instability In the meantime he should not be doing any climbing. Once the knees are better he will try to her repeat implement his walking exercises to help reduce his cholesterol as this is always worked for him in the past. The patient will bring his blood pressure monitor by in a couple weeks for recheck of blood pressure here in this office by our nurse and with his monitor. He should watch his sodium intake closely.

## 2016-07-25 ENCOUNTER — Ambulatory Visit (INDEPENDENT_AMBULATORY_CARE_PROVIDER_SITE_OTHER): Payer: Medicare HMO | Admitting: Pharmacist

## 2016-07-25 DIAGNOSIS — M25561 Pain in right knee: Secondary | ICD-10-CM

## 2016-07-25 DIAGNOSIS — I482 Chronic atrial fibrillation, unspecified: Secondary | ICD-10-CM

## 2016-07-25 DIAGNOSIS — G8929 Other chronic pain: Secondary | ICD-10-CM

## 2016-07-25 DIAGNOSIS — M25562 Pain in left knee: Secondary | ICD-10-CM | POA: Diagnosis not present

## 2016-07-25 MED ORDER — WARFARIN SODIUM 5 MG PO TABS: 5.0000 mg | ORAL_TABLET | Freq: Every day | ORAL | 1 refills | Status: DC

## 2016-07-25 NOTE — Progress Notes (Signed)
Patient ID: Danny Zinn., male   DOB: Jul 05, 1931, 83 y.o.   MRN: [id number]  Subjective:    HPI: Danny [name] had been taking Eliquis for about 2 months.  He states that knee pain started about the same time that Eliquis was started.  He would like to switch to warfarin because of this and because of cost of Eliquis (over $300/month)  Indication: atrial fibrillation Bleeding signs/symptoms: None Thromboembolic signs/symptoms: None   Medication changes: no Dietary changes: no Bacterial/viral infection: no Other concerns: no   Objective:      BMET    Component Value Date/Time   NA 141 07/14/2016 1539   K 4.0 07/14/2016 1539   CL 101 07/14/2016 1539   CO2 26 07/14/2016 1539   GLUCOSE 94 07/14/2016 1539   GLUCOSE 101 (H) 05/05/2016 1110   BUN 9 07/14/2016 1539   CREATININE 0.92 07/14/2016 1539   CREATININE 0.85 05/25/2012 0832   CALCIUM 9.0 07/14/2016 1539   GFRNONAA 77 07/14/2016 1539   GFRNONAA 83 05/25/2012 0832   GFRAA 89 07/14/2016 1539   GFRAA >89 05/25/2012 0832     Assessment:    atrial fibrillation in need of chronic anticoagulation  Knee pain - likely related to OA  Plan:    1. Patient will finish last 3 days of Eliquis.  Then start warfarin 5mg  1 tablet daily. 2. Next INR: 1 week   3.  Reviewed s/s of bleeding 4.  Discussed dietary consideration with warfarin and drug interactions.  5.  Per Dr Tawanna Sat notes on x ray of knee - referral for [facility] sent.

## 2016-07-25 NOTE — Patient Instructions (Signed)
Finish that 3 days of Eliquis that you have remaining.  Start warfarin July 4th in the evening - start warfarin 5mg  take 1 tablet daily in the evening.   Vitamin K Foods and Warfarin Warfarin is a blood thinner (anticoagulant). Anticoagulant medicines help prevent the formation of blood clots. These medicines work by decreasing the activity of vitamin K, which promotes normal blood clotting. When you take warfarin, problems can occur from suddenly increasing or decreasing the amount of vitamin K that you eat from one day to the next. Problems may include:  Blood clots.  Bleeding.  What general guidelines do I need to follow? To avoid problems when taking warfarin:  Eat a balanced diet that includes: ? Fresh fruits and vegetables. ? Whole grains. ? Low-fat dairy products. ? Lean proteins, such as fish, eggs, and lean cuts of meat.  Keep your intake of vitamin K consistent from day to day. To do this: ? Avoid eating large amounts of vitamin K one day and low amounts of vitamin K the next day. ? If you take a multivitamin that contains vitamin K, be sure to take it every day. ? Know which foods contain vitamin K. Use the lists below to understand serving sizes and the amount of vitamin K in one serving.  Avoid major changes in your diet. If you are going to change your diet, talk with your health care provider before making changes.  Work with a Financial planner (dietitian) to develop a meal plan that works best for you.  High vitamin K foods Foods that are high in vitamin K contain more than 100 mcg (micrograms) per serving. These include:  Broccoli (cooked) -  cup has 110 mcg.  Brussels sprouts (cooked) -  cup has 109 mcg.  Greens, beet (cooked) -  cup has 350 mcg.  Greens, collard (cooked) -  cup has 418 mcg.  Greens, turnip (cooked) -  cup has 265 mcg.  Green onions or scallions -  cup has 105 mcg.  Kale (fresh or frozen) -  cup has 531 mcg.  Parsley (raw) -  10 sprigs has 164 mcg.  Spinach (cooked) -  cup has 444 mcg.  Swiss chard (cooked) -  cup has 287 mcg.  Moderate vitamin K foods Foods that have a moderate amount of vitamin K contain 25-100 mcg per serving. These include:  Asparagus (cooked) - 5 spears have 38 mcg.  Black-eyed peas (dried) -  cup has 32 mcg.  Cabbage (cooked) -  cup has 37 mcg.  Kiwi fruit - 1 medium has 31 mcg.  Lettuce - 1 cup has 57-63 mcg.  Okra (frozen) -  cup has 44 mcg.  Prunes (dried) - 5 prunes have 25 mcg.  Watercress (raw) - 1 cup has 85 mcg.  Low vitamin K foods Foods low in vitamin K contain less than 25 mcg per serving. These include:  Artichoke - 1 medium has 18 mcg.  Avocado - 1 oz. has 6 mcg.  Blueberries -  cup has 14 mcg.  Cabbage (raw) -  cup has 21 mcg.  Carrots (cooked) -  cup has 11 mcg.  Cauliflower (raw) -  cup has 11 mcg.  Cucumber with peel (raw) -  cup has 9 mcg.  Grapes -  cup has 12 mcg.  Mango - 1 medium has 9 mcg.  Nuts - 1 oz. has 15 mcg.  Pear - 1 medium has 8 mcg.  Peas (cooked) -  cup has 19 mcg.  Pickles - 1 spear has 14 mcg.  Pumpkin seeds - 1 oz. has 13 mcg.  Sauerkraut (canned) -  cup has 16 mcg.  Soybeans (cooked) -  cup has 16 mcg.  Tomato (raw) - 1 medium has 10 mcg.  Tomato sauce -  cup has 17 mcg.  Vitamin K-free foods If a food contain less than 5 mcg per serving, it is considered to have no vitamin K. These foods include:  Bread and cereal products.  Cheese.  Eggs.  Fish and shellfish.  Meat and poultry.  Milk and dairy products.  Sunflower seeds.  Actual amounts of vitamin K in foods may be different depending on processing. Talk with your dietitian about what foods you can eat and what foods you should avoid. This information is not intended to replace advice given to you by your health care provider. Make sure you discuss any questions you have with your health care provider. Document Released:  11/07/2008 Document Revised: 08/02/2015 Document Reviewed: 04/15/2015 Elsevier Interactive Patient Education  2017 [employer].

## 2016-07-29 DIAGNOSIS — H353131 Nonexudative age-related macular degeneration, bilateral, early dry stage: Secondary | ICD-10-CM | POA: Diagnosis not present

## 2016-07-30 ENCOUNTER — Other Ambulatory Visit: Payer: Self-pay | Admitting: Cardiovascular Disease

## 2016-08-01 ENCOUNTER — Ambulatory Visit (INDEPENDENT_AMBULATORY_CARE_PROVIDER_SITE_OTHER): Payer: Medicare HMO | Admitting: Pharmacist

## 2016-08-01 DIAGNOSIS — I4891 Unspecified atrial fibrillation: Secondary | ICD-10-CM | POA: Insufficient documentation

## 2016-08-01 DIAGNOSIS — I481 Persistent atrial fibrillation: Secondary | ICD-10-CM

## 2016-08-01 DIAGNOSIS — I4819 Other persistent atrial fibrillation: Secondary | ICD-10-CM

## 2016-08-01 DIAGNOSIS — Z7901 Long term (current) use of anticoagulants: Secondary | ICD-10-CM | POA: Insufficient documentation

## 2016-08-01 DIAGNOSIS — I480 Paroxysmal atrial fibrillation: Secondary | ICD-10-CM | POA: Insufficient documentation

## 2016-08-01 LAB — COAGUCHEK XS/INR WAIVED
INR: 1.9 — ABNORMAL HIGH (ref 0.9–1.1)
Prothrombin Time: 22.3 s

## 2016-08-01 NOTE — Addendum Note (Signed)
Addended by: Cherre Robins B on: 08/01/2016 01:23 PM   Modules accepted: Level of Service

## 2016-08-09 ENCOUNTER — Ambulatory Visit (INDEPENDENT_AMBULATORY_CARE_PROVIDER_SITE_OTHER): Payer: Medicare HMO | Admitting: Pharmacist

## 2016-08-09 DIAGNOSIS — I4819 Other persistent atrial fibrillation: Secondary | ICD-10-CM

## 2016-08-09 DIAGNOSIS — Z7901 Long term (current) use of anticoagulants: Secondary | ICD-10-CM

## 2016-08-09 DIAGNOSIS — I481 Persistent atrial fibrillation: Secondary | ICD-10-CM

## 2016-08-09 LAB — COAGUCHEK XS/INR WAIVED
INR: 2 — ABNORMAL HIGH (ref 0.9–1.1)
PROTHROMBIN TIME: 24 s

## 2016-08-09 NOTE — Patient Instructions (Signed)
Anticoagulation Warfarin Dose Instructions as of 08/09/2016      Danny [name] Tue Wed Thu Fri Sat   New Dose 5 mg 2.5 mg 5 mg 2.5 mg 5 mg 2.5 mg 5 mg    Description   Change warfarin 5mg  dose to 1/2 tablet Mondays, Wednesdays and Fridays.  Take 1 tablet all other days.  INR was 2.0 today (goal is 2.0 to 3.0)      Hypertension Hypertension is another name for high blood pressure. High blood pressure forces your heart to work harder to pump blood. This can cause problems over time. There are two numbers in a blood pressure reading. There is a top number (systolic) over a bottom number (diastolic). It is best to have a blood pressure below 120/80. But older individuals might have a higher goal of less than 140/90. Healthy choices can help lower your blood pressure. You may need medicine to help lower your blood pressure if:  Your blood pressure cannot be lowered with healthy choices.  Your blood pressure is higher than 130/80.  Follow these instructions at home: Eating and drinking  If directed, follow the DASH eating plan. This diet includes: ? Filling half of your plate at each meal with fruits and vegetables. ? Filling one quarter of your plate at each meal with whole grains. Whole grains include whole wheat pasta, brown rice, and whole grain bread. ? Eating or drinking low-fat dairy products, such as skim milk or low-fat yogurt. ? Filling one quarter of your plate at each meal with low-fat (lean) proteins. Low-fat proteins include fish, skinless chicken, eggs, beans, and tofu. ? Avoiding fatty meat, cured and processed meat, or chicken with skin. ? Avoiding premade or processed food.  Eat less than 1,500 mg of salt (sodium) a day.  Limit alcohol use to no more than 1 drink a day for nonpregnant women and 2 drinks a day for men. One drink equals 12 oz of beer, 5 oz of wine, or 1 oz of hard liquor. Lifestyle  Work with your doctor to stay at a healthy weight or to lose weight. Ask your  doctor what the best weight is for you.  Get at least 30 minutes of exercise that causes your heart to beat faster (aerobic exercise) most days of the week. This may include walking, swimming, or biking.  Get at least 30 minutes of exercise that strengthens your muscles (resistance exercise) at least 3 days a week. This may include lifting weights or pilates.  Do not use any products that contain nicotine or tobacco. This includes cigarettes and e-cigarettes. If you need help quitting, ask your doctor.  Check your blood pressure at home as told by your doctor.  Keep all follow-up visits as told by your doctor. This is important. Medicines  Take over-the-counter and prescription medicines only as told by your doctor. Follow directions carefully.  Do not skip doses of blood pressure medicine. The medicine does not work as well if you skip doses. Skipping doses also puts you at risk for problems.  Ask your doctor about side effects or reactions to medicines that you should watch for. Contact a doctor if:  You think you are having a reaction to the medicine you are taking.  You have headaches that keep coming back (recurring).  You feel dizzy.  You have swelling in your ankles.  You have trouble with your vision. Get help right away if:  You get a very bad headache.  You start to  feel confused.  You feel weak or numb.  You feel faint.  You get very bad pain in your: ? Chest. ? Belly (abdomen).  You throw up (vomit) more than once.  You have trouble breathing. Summary  Hypertension is another name for high blood pressure.  Making healthy choices can help lower blood pressure. If your blood pressure cannot be controlled with healthy choices, you may need to take medicine. This information is not intended to replace advice given to you by your health care provider. Make sure you discuss any questions you have with your health care provider. Document Released: 06/29/2007  Document Revised: 12/09/2015 Document Reviewed: 12/09/2015 Elsevier Interactive Patient Education  [employer].

## 2016-08-15 ENCOUNTER — Encounter: Payer: Self-pay | Admitting: Pharmacist Clinician (PhC)/ Clinical Pharmacy Specialist

## 2016-08-18 DIAGNOSIS — M25561 Pain in right knee: Secondary | ICD-10-CM | POA: Diagnosis not present

## 2016-08-18 DIAGNOSIS — M25562 Pain in left knee: Secondary | ICD-10-CM | POA: Diagnosis not present

## 2016-08-22 ENCOUNTER — Other Ambulatory Visit: Payer: Self-pay | Admitting: Pharmacist

## 2016-08-22 ENCOUNTER — Ambulatory Visit: Payer: Commercial Managed Care - HMO | Admitting: Family Medicine

## 2016-08-26 ENCOUNTER — Ambulatory Visit (INDEPENDENT_AMBULATORY_CARE_PROVIDER_SITE_OTHER): Payer: Medicare HMO | Admitting: Pharmacist Clinician (PhC)/ Clinical Pharmacy Specialist

## 2016-08-26 DIAGNOSIS — I481 Persistent atrial fibrillation: Secondary | ICD-10-CM | POA: Diagnosis not present

## 2016-08-26 DIAGNOSIS — I4891 Unspecified atrial fibrillation: Secondary | ICD-10-CM

## 2016-08-26 DIAGNOSIS — Z7901 Long term (current) use of anticoagulants: Secondary | ICD-10-CM

## 2016-08-26 DIAGNOSIS — I4819 Other persistent atrial fibrillation: Secondary | ICD-10-CM

## 2016-08-26 LAB — COAGUCHEK XS/INR WAIVED
INR: 1.3 — ABNORMAL HIGH (ref 0.9–1.1)
PROTHROMBIN TIME: 15.5 s

## 2016-08-26 NOTE — Patient Instructions (Addendum)
Anticoagulation Warfarin Dose Instructions as of 08/26/2016      [name] Tue Wed Thu Fri Sat   New Dose 5 mg 2.5 mg 5 mg 2.5 mg 5 mg 2.5 mg 5 mg    Description   Take 1 tablet today.  Change warfarin 5mg  dose to 1/2 tablet Mondays  and Fridays.  Take 1 tablet all other days.  INR was 1.3 today (goal is 2.0 to 3.0) too thick

## 2016-08-26 NOTE — Progress Notes (Signed)
Patient ID: Danny [name]., male   DOB: 29-Oct-1931, 83 y.o.   MRN: [id number] Danny [name] is seen today for distant CABG, 2003 carotid disease with left CEA and right subclavian bypass. in 2009 He has HTN and elevated lipids. last visit he had had a syncopal episode. There was no cardiac etiology found. He has great exercise tolerance with no angina. He has improved strength in his right arm and no TIA symptoms. He has residual right carotid disease.  Compliant with meds   Duplex 03/2014 with elevated proximal anastomotic velocity from subclavian to CCA bypass 4.69m/sec 78-29% RICA 56-21% LICA  Seen by Dr Danny [name] who did not think any intervention needed and also noted patent distal subclavian stent  EGD [facility] with gastritis and diverticulitis on omeprazole bid   Primary concerned about irregular heart rate.  Known PVC;s F/U event monitor with no NSVT  F/U echo with  10/10/14 reviewed EF normal 55-60%  Mild MR  His ARB and beta blocker have been decreased in past for dizzyness and bradycardia  04/25/16 seen in afib clinic and had new onset afib. Cut back on daily beer intake. Started on eliquis to facilitate [facility] But cost is high and he mentioned changing to coumadin after 30 day free trial  [facility] done 05/05/16 converted with single low energy shock  Feels well minimal postural dizziness no palpitations    ROS: Denies fever, malais, weight loss, blurry vision, decreased visual acuity, cough, sputum, SOB, hemoptysis, pleuritic pain, palpitaitons, heartburn, abdominal pain, melena, lower extremity edema, claudication, or rash.  All other systems reviewed and negative  General:  BP 308 systolic  On left and 657 systolic on right  Affect appropriate Healthy:  appears stated age 34: normal Neck supple with no adenopathy JVP normal bilateral  bruits no thyromegaly Lungs clear with no wheezing and good diaphragmatic motion Heart:  S1/S2 SEM murmur, no rub, gallop or click PMI normal  Right carotid  subclavian bypass  Abdomen: benighn, BS positve, no tenderness, no AAA no bruit.  No HSM or HJR Distal pulses intact with no bruits No edema Neuro non-focal Skin warm and dry No muscular weakness   Current Outpatient Prescriptions  Medication Sig Dispense Refill  . ALPRAZolam (XANAX) 0.25 MG tablet Take 1 Tablet by mouth at bedtime AS NEEDED 30 tablet 2  . Cholecalciferol (VITAMIN D-3 PO) Take 2,000 mg by mouth every morning.     [name] losartan (COZAAR) 50 MG tablet TAKE 1 TABLET EVERY MORNING 90 tablet 1  . lovastatin (MEVACOR) 40 MG tablet TAKE 1 TABLET EVERY MORNING  (NEED OFFICE VISIT) 15 tablet 0  . metoprolol tartrate (LOPRESSOR) 25 MG tablet TAKE 1/2 TABLET TWICE DAILY 90 tablet 1  . Multiple Vitamin (MULTIVITAMIN) tablet Take 1 tablet by mouth every morning.     . nitroGLYCERIN (NITROSTAT) 0.4 MG SL tablet PLACE 1 TABLET (0.4 MG TOTAL) UNDER THE TONGUE EVERY 5 (FIVE) MINUTES AS NEEDED FOR CHEST PAIN. 25 tablet 1  . Omega-3 Fatty Acids (FISH OIL) 1000 MG CAPS Take 2 capsules by mouth every morning.     [name] omeprazole (PRILOSEC) 20 MG capsule TAKE 1 CAPSULE TWICE DAILY 30 MINUTES PRIOR TO BREAKFAST AND SUPPER 180 capsule 1  . Probiotic Product (ALIGN) 4 MG CAPS Take 4 mg by mouth daily as needed.     . warfarin (COUMADIN) 5 MG tablet Take 1 tablet (5 mg total) by mouth daily. (Patient taking differently: Take 5 mg by mouth daily. 1/2 tablet Mondays, wednesdays and  Friday.  1 tablet all other days) 90 tablet 1   No current facility-administered medications for this visit.     Allergies  Doxycycline; Contrast media [iodinated diagnostic agents]; Eszopiclone; Lunesta [eszopiclone]; and Morphine and related  Electrocardiogram:   11/30/12  SR rate 60 LAE otherwise normal  05/15/14  SR ate 47  Normal ECG   Assessment and Plan CAD:  Distant CABG stable no chest pain   Bradycardia.:  Improved with lower dose beta blocker  PVD:  Stenosis in carotid limb of right bypass and VB steal f/u  duplex 6 months at [facility] office Not likely  Any good surgical options at his age symptoms stable  HTN:  Well controlled should follow BP in left arm which is higher  GI:  Improved with recent diagnosis of gastritis and diverticulitis  Omeprazole bid  PAF:  [facility] [place] on 05/05/16  Now transitioned to coumadin due to cost  INR 08/26/16 low at 1.3 followed At Dr Danny [name] office compliant with med  Danny [name]

## 2016-08-30 ENCOUNTER — Ambulatory Visit (INDEPENDENT_AMBULATORY_CARE_PROVIDER_SITE_OTHER): Payer: Medicare HMO | Admitting: Cardiovascular Disease

## 2016-08-30 ENCOUNTER — Encounter: Payer: Self-pay | Admitting: Cardiovascular Disease

## 2016-08-30 VITALS — BP 140/64 | HR 83 | Ht 74.0 in | Wt 162.4 lb

## 2016-08-30 DIAGNOSIS — R0989 Other specified symptoms and signs involving the circulatory and respiratory systems: Secondary | ICD-10-CM | POA: Diagnosis not present

## 2016-08-30 DIAGNOSIS — G458 Other transient cerebral ischemic attacks and related syndromes: Secondary | ICD-10-CM

## 2016-08-30 NOTE — Patient Instructions (Signed)
Medication Instructions:  Your physician recommends that you continue on your current medications as directed. Please refer to the Current Medication list given to you today.   Labwork: NONE  Testing/Procedures: Your physician has requested that you have a carotid duplex. This test is an ultrasound of the carotid arteries in your neck. It looks at blood flow through these arteries that supply the brain with blood. Allow one hour for this exam. There are no restrictions or special instructions.  Your physician has requested that you have a lower or upper extremity arterial duplex. This test is an ultrasound of the arteries in the legs or arms. It looks at arterial blood flow in the legs and arms. Allow one hour for Upper Arterial scans. There are no restrictions or special instructions   Follow-Up: Your physician wants you to follow-up in: 6 Months with Dr. Johnsie Cancel. You will receive a reminder letter in the mail two months in advance. If you don't receive a letter, please call our office to schedule the follow-up appointment.   Any Other Special Instructions Will Be Listed Below (If Applicable).     If you need a refill on your cardiac medications before your next appointment, please call your pharmacy.

## 2016-09-07 ENCOUNTER — Other Ambulatory Visit: Payer: Self-pay | Admitting: Family Medicine

## 2016-09-09 ENCOUNTER — Encounter: Payer: Self-pay | Admitting: Family Medicine

## 2016-09-09 ENCOUNTER — Ambulatory Visit (INDEPENDENT_AMBULATORY_CARE_PROVIDER_SITE_OTHER): Payer: Medicare HMO

## 2016-09-09 ENCOUNTER — Encounter: Payer: Self-pay | Admitting: Pharmacist Clinician (PhC)/ Clinical Pharmacy Specialist

## 2016-09-09 ENCOUNTER — Ambulatory Visit (INDEPENDENT_AMBULATORY_CARE_PROVIDER_SITE_OTHER): Payer: Medicare HMO | Admitting: Family Medicine

## 2016-09-09 VITALS — BP 142/67 | HR 58 | Temp 96.9°F | Ht 74.0 in | Wt 162.0 lb

## 2016-09-09 DIAGNOSIS — M25551 Pain in right hip: Secondary | ICD-10-CM | POA: Diagnosis not present

## 2016-09-09 DIAGNOSIS — W19XXXA Unspecified fall, initial encounter: Secondary | ICD-10-CM | POA: Diagnosis not present

## 2016-09-09 DIAGNOSIS — Z7901 Long term (current) use of anticoagulants: Secondary | ICD-10-CM

## 2016-09-09 DIAGNOSIS — S79911A Unspecified injury of right hip, initial encounter: Secondary | ICD-10-CM | POA: Diagnosis not present

## 2016-09-09 DIAGNOSIS — I4891 Unspecified atrial fibrillation: Secondary | ICD-10-CM | POA: Diagnosis not present

## 2016-09-09 LAB — COAGUCHEK XS/INR WAIVED
INR: 1.5 — ABNORMAL HIGH (ref 0.9–1.1)
PROTHROMBIN TIME: 17.8 s

## 2016-09-09 MED ORDER — PREDNISONE 10 MG PO TABS: ORAL_TABLET | ORAL | 0 refills | Status: DC

## 2016-09-09 NOTE — Progress Notes (Signed)
Chief Complaint  Patient presents with  . Hip Pain    pt here today c/o right hip pain after falling 12 days ago. The pain isn't getting any better.    HPI  Patient presents today for Patient continues to have pain in the right hip after falling 12 days ago. He is able to walk. He has moderate discomfort after walking a distance though. He has a bit of a limp.  PMH: Smoking status noted ROS: Per HPI  Objective: BP (!) 142/67   Pulse (!) 58   Temp (!) 96.9 F (36.1 C) (Oral)   Ht 6\' 2"  (1.88 m)   Wt 162 lb (73.5 kg)   BMI 20.80 kg/m  Gen: NAD, alert, cooperative with exam HEENT: NCAT, EOMI, PERRL CV: RRR, good S1/S2, no murmur Resp: CTABL, no wheezes, non-labored Abd: SNTND, BS present, no guarding or organomegaly Ext: No edema, warm. There is mild to moderate tenderness at the level of the greater trochanter. There is full range of motion for both hips. No deformity noted. Neuro: Alert and oriented, No gross deficits  X-ray is negative for fracture for the right hip. Perhaps some DJD. Assessment and plan:  1. Fall, initial encounter   2. Injury of right hip, initial encounter   3. Long term (current) use of anticoagulants   4. Atrial fibrillation, unspecified type ([place])     Meds ordered this encounter  Medications  . predniSONE (DELTASONE) 10 MG tablet    Sig: Take 5 daily for 2 days followed by 4,3,2 and 1 for 2 days each.    Dispense:  30 tablet    Refill:  0    Orders Placed This Encounter  Procedures  . DG HIP UNILAT W OR W/O PELVIS 2-3 VIEWS RIGHT    Standing Status:   Future    Number of Occurrences:   1    Standing Expiration Date:   11/08/2017    Order Specific Question:   Reason for Exam (SYMPTOM  OR DIAGNOSIS REQUIRED)    Answer:   fall    Order Specific Question:   Preferred imaging location?    Answer:   Internal  . NM Bone Scan 3 Phase Lower Extremity    Standing Status:   Future    Standing Expiration Date:   11/09/2017    Order Specific  Question:   If indicated for the ordered procedure, I authorize the administration of a radiopharmaceutical per Radiology protocol    Answer:   Yes    Order Specific Question:   Preferred imaging location?    Answer:   [facility]    Order Specific Question:   Radiology Contrast Protocol - do NOT remove file path    Answer:   [email or web address]  . CoaguChek XS/INR Waived    Follow up as needed.  Claretta Fraise, MD

## 2016-09-29 ENCOUNTER — Ambulatory Visit (INDEPENDENT_AMBULATORY_CARE_PROVIDER_SITE_OTHER): Payer: Medicare HMO | Admitting: *Deleted

## 2016-09-29 DIAGNOSIS — Z7901 Long term (current) use of anticoagulants: Secondary | ICD-10-CM

## 2016-09-29 DIAGNOSIS — I4891 Unspecified atrial fibrillation: Secondary | ICD-10-CM | POA: Diagnosis not present

## 2016-09-29 LAB — COAGUCHEK XS/INR WAIVED
INR: 2.6 — ABNORMAL HIGH (ref 0.9–1.1)
Prothrombin Time: 31.2 s

## 2016-09-29 NOTE — Patient Instructions (Signed)
Anticoagulation Warfarin Dose Instructions as of 09/29/2016      [name] Tue Wed Thu Fri Sat   New Dose 5 mg 5 mg 5 mg 5 mg 5 mg 5 mg 5 mg    Description   Continue to take 1 tablet (5mg ) daily.   INR was within goal today at 2.6.  Your goal is 2.0-3.0  Return on 10/21/16 at 10:20 to see [facility]

## 2016-09-29 NOTE — Progress Notes (Signed)
Subjective:     Indication: atrial fibrillation Bleeding signs/symptoms: None Thromboembolic signs/symptoms: None  Missed Coumadin doses: None Medication changes: no Dietary changes: no Bacterial/viral infection: no Other concerns: no  Patient would like to get back on track with following up at the same time as his wife.  Objective:    INR Today: 2.6 Current dose: 5mg  daily    Assessment:    Therapeutic INR for goal of 2-3   Plan:    1. New dose: no change   2. Next INR: 3 weeks    Chong Sicilian, RN  Arrie Senate MD

## 2016-09-30 ENCOUNTER — Other Ambulatory Visit: Payer: Self-pay | Admitting: Family Medicine

## 2016-09-30 NOTE — Telephone Encounter (Signed)
Please forward to Dr. Livia Snellen as he will be back on Monday

## 2016-09-30 NOTE — Telephone Encounter (Signed)
Last seen 09/09/16  Dr Livia Snellen  If approved route to nurse to call into [address] [facility]

## 2016-09-30 NOTE — Telephone Encounter (Signed)
[place] pt - phoned to [facility]

## 2016-10-05 ENCOUNTER — Encounter (HOSPITAL_COMMUNITY): Payer: Medicare HMO

## 2016-10-05 ENCOUNTER — Other Ambulatory Visit (HOSPITAL_COMMUNITY): Payer: Medicare HMO

## 2016-10-10 DIAGNOSIS — Z08 Encounter for follow-up examination after completed treatment for malignant neoplasm: Secondary | ICD-10-CM | POA: Diagnosis not present

## 2016-10-10 DIAGNOSIS — C44629 Squamous cell carcinoma of skin of left upper limb, including shoulder: Secondary | ICD-10-CM | POA: Diagnosis not present

## 2016-10-10 DIAGNOSIS — X32XXXD Exposure to sunlight, subsequent encounter: Secondary | ICD-10-CM | POA: Diagnosis not present

## 2016-10-10 DIAGNOSIS — Z85828 Personal history of other malignant neoplasm of skin: Secondary | ICD-10-CM | POA: Diagnosis not present

## 2016-10-10 DIAGNOSIS — D225 Melanocytic nevi of trunk: Secondary | ICD-10-CM | POA: Diagnosis not present

## 2016-10-10 DIAGNOSIS — L570 Actinic keratosis: Secondary | ICD-10-CM | POA: Diagnosis not present

## 2016-10-20 ENCOUNTER — Other Ambulatory Visit: Payer: Self-pay | Admitting: Cardiovascular Disease

## 2016-10-20 ENCOUNTER — Other Ambulatory Visit: Payer: Self-pay | Admitting: Family Medicine

## 2016-10-21 ENCOUNTER — Ambulatory Visit (INDEPENDENT_AMBULATORY_CARE_PROVIDER_SITE_OTHER): Payer: Medicare HMO | Admitting: Pharmacist Clinician (PhC)/ Clinical Pharmacy Specialist

## 2016-10-21 DIAGNOSIS — I4891 Unspecified atrial fibrillation: Secondary | ICD-10-CM | POA: Diagnosis not present

## 2016-10-21 DIAGNOSIS — Z7901 Long term (current) use of anticoagulants: Secondary | ICD-10-CM

## 2016-10-21 LAB — COAGUCHEK XS/INR WAIVED
INR: 1.9 — AB (ref 0.9–1.1)
Prothrombin Time: 23.2 s

## 2016-10-21 NOTE — Patient Instructions (Signed)
Anticoagulation Warfarin Dose Instructions as of 10/21/2016      [name] Tue Wed Thu Fri Sat   New Dose 5 mg 5 mg 5 mg 5 mg 5 mg 5 mg 5 mg    Description   Continue to take 1 tablet (5mg ) daily.   INR is 1.9 today  ( little thick today) Your goal is 2.0-3.0  Return on 10/21/16 at 10:20 to see [facility]

## 2016-10-27 ENCOUNTER — Other Ambulatory Visit: Payer: Self-pay | Admitting: Family Medicine

## 2016-10-28 NOTE — Telephone Encounter (Signed)
Patient last seen in office on 8/17 for regular visit and anti coag on 09/30/16. Rx last filled on 10/4. Pharmacy requesting refills to put on file. Please advise

## 2016-11-07 ENCOUNTER — Ambulatory Visit (INDEPENDENT_AMBULATORY_CARE_PROVIDER_SITE_OTHER): Payer: Medicare HMO

## 2016-11-07 DIAGNOSIS — Z23 Encounter for immunization: Secondary | ICD-10-CM

## 2016-11-09 ENCOUNTER — Encounter: Payer: Medicare HMO | Admitting: *Deleted

## 2016-11-14 ENCOUNTER — Other Ambulatory Visit: Payer: Self-pay | Admitting: Family Medicine

## 2016-11-17 DIAGNOSIS — H40052 Ocular hypertension, left eye: Secondary | ICD-10-CM | POA: Diagnosis not present

## 2016-11-17 DIAGNOSIS — H40051 Ocular hypertension, right eye: Secondary | ICD-10-CM | POA: Diagnosis not present

## 2016-11-17 DIAGNOSIS — H353131 Nonexudative age-related macular degeneration, bilateral, early dry stage: Secondary | ICD-10-CM | POA: Diagnosis not present

## 2016-11-17 DIAGNOSIS — H40053 Ocular hypertension, bilateral: Secondary | ICD-10-CM | POA: Diagnosis not present

## 2016-11-22 ENCOUNTER — Other Ambulatory Visit: Payer: Medicare HMO

## 2016-11-22 DIAGNOSIS — I481 Persistent atrial fibrillation: Secondary | ICD-10-CM | POA: Diagnosis not present

## 2016-11-22 DIAGNOSIS — I700 Atherosclerosis of aorta: Secondary | ICD-10-CM

## 2016-11-22 DIAGNOSIS — N400 Enlarged prostate without lower urinary tract symptoms: Secondary | ICD-10-CM | POA: Diagnosis not present

## 2016-11-22 DIAGNOSIS — E559 Vitamin D deficiency, unspecified: Secondary | ICD-10-CM

## 2016-11-22 DIAGNOSIS — E871 Hypo-osmolality and hyponatremia: Secondary | ICD-10-CM

## 2016-11-22 DIAGNOSIS — I10 Essential (primary) hypertension: Secondary | ICD-10-CM | POA: Diagnosis not present

## 2016-11-22 DIAGNOSIS — I4819 Other persistent atrial fibrillation: Secondary | ICD-10-CM

## 2016-11-22 DIAGNOSIS — E7800 Pure hypercholesterolemia, unspecified: Secondary | ICD-10-CM

## 2016-11-23 LAB — CBC WITH DIFFERENTIAL/PLATELET
BASOS: 1 %
Basophils Absolute: 0.1 10*3/uL (ref 0.0–0.2)
EOS (ABSOLUTE): 0.2 10*3/uL (ref 0.0–0.4)
Eos: 4 %
Hematocrit: 42.4 % (ref 37.5–51.0)
Hemoglobin: 14.6 g/dL (ref 13.0–17.7)
IMMATURE GRANS (ABS): 0 10*3/uL (ref 0.0–0.1)
Immature Granulocytes: 0 %
Lymphocytes Absolute: 1.7 10*3/uL (ref 0.7–3.1)
Lymphs: 33 %
MCH: 32.4 pg (ref 26.6–33.0)
MCHC: 34.4 g/dL (ref 31.5–35.7)
MCV: 94 fL (ref 79–97)
MONOS ABS: 0.6 10*3/uL (ref 0.1–0.9)
Monocytes: 11 %
NEUTROS ABS: 2.7 10*3/uL (ref 1.4–7.0)
Neutrophils: 51 %
PLATELETS: 291 10*3/uL (ref 150–379)
RBC: 4.5 x10E6/uL (ref 4.14–5.80)
RDW: 14.4 % (ref 12.3–15.4)
WBC: 5.2 10*3/uL (ref 3.4–10.8)

## 2016-11-23 LAB — LIPID PANEL
Chol/HDL Ratio: 2.4 ratio (ref 0.0–5.0)
Cholesterol, Total: 203 mg/dL — ABNORMAL HIGH (ref 100–199)
HDL: 85 mg/dL
LDL Calculated: 105 mg/dL — ABNORMAL HIGH (ref 0–99)
Triglycerides: 64 mg/dL (ref 0–149)
VLDL Cholesterol Cal: 13 mg/dL (ref 5–40)

## 2016-11-23 LAB — HEPATIC FUNCTION PANEL
ALT: 16 IU/L (ref 0–44)
AST: 17 IU/L (ref 0–40)
Albumin: 4.3 g/dL (ref 3.5–4.7)
Alkaline Phosphatase: 67 IU/L (ref 39–117)
BILIRUBIN, DIRECT: 0.19 mg/dL (ref 0.00–0.40)
Bilirubin Total: 0.6 mg/dL (ref 0.0–1.2)
TOTAL PROTEIN: 6.5 g/dL (ref 6.0–8.5)

## 2016-11-23 LAB — BMP8+EGFR
BUN / CREAT RATIO: 11 (ref 10–24)
BUN: 10 mg/dL (ref 8–27)
CHLORIDE: 93 mmol/L — AB (ref 96–106)
CO2: 26 mmol/L (ref 20–29)
Calcium: 9.3 mg/dL (ref 8.6–10.2)
Creatinine, Ser: 0.93 mg/dL (ref 0.76–1.27)
GFR, EST AFRICAN AMERICAN: 87 mL/min/{1.73_m2} (ref >59)
GFR, EST NON AFRICAN AMERICAN: 76 mL/min/{1.73_m2} (ref >59)
Glucose: 90 mg/dL (ref 65–99)
Potassium: 4.4 mmol/L (ref 3.5–5.2)
Sodium: 132 mmol/L — ABNORMAL LOW (ref 134–144)

## 2016-11-23 LAB — VITAMIN D 25 HYDROXY (VIT D DEFICIENCY, FRACTURES): VIT D 25 HYDROXY: 77 ng/mL (ref 30.0–100.0)

## 2016-11-29 ENCOUNTER — Ambulatory Visit (INDEPENDENT_AMBULATORY_CARE_PROVIDER_SITE_OTHER): Payer: Medicare HMO | Admitting: Family Medicine

## 2016-11-29 ENCOUNTER — Encounter: Payer: Self-pay | Admitting: Family Medicine

## 2016-11-29 VITALS — BP 133/52 | HR 57 | Temp 97.2°F | Ht 74.0 in | Wt 165.0 lb

## 2016-11-29 DIAGNOSIS — I25810 Atherosclerosis of coronary artery bypass graft(s) without angina pectoris: Secondary | ICD-10-CM

## 2016-11-29 DIAGNOSIS — E559 Vitamin D deficiency, unspecified: Secondary | ICD-10-CM

## 2016-11-29 DIAGNOSIS — I10 Essential (primary) hypertension: Secondary | ICD-10-CM

## 2016-11-29 DIAGNOSIS — Z7901 Long term (current) use of anticoagulants: Secondary | ICD-10-CM | POA: Diagnosis not present

## 2016-11-29 DIAGNOSIS — I481 Persistent atrial fibrillation: Secondary | ICD-10-CM

## 2016-11-29 DIAGNOSIS — R0989 Other specified symptoms and signs involving the circulatory and respiratory systems: Secondary | ICD-10-CM

## 2016-11-29 DIAGNOSIS — E7800 Pure hypercholesterolemia, unspecified: Secondary | ICD-10-CM | POA: Diagnosis not present

## 2016-11-29 DIAGNOSIS — M25561 Pain in right knee: Secondary | ICD-10-CM | POA: Diagnosis not present

## 2016-11-29 DIAGNOSIS — I700 Atherosclerosis of aorta: Secondary | ICD-10-CM

## 2016-11-29 DIAGNOSIS — M25562 Pain in left knee: Secondary | ICD-10-CM | POA: Diagnosis not present

## 2016-11-29 DIAGNOSIS — I4819 Other persistent atrial fibrillation: Secondary | ICD-10-CM

## 2016-11-29 DIAGNOSIS — G8929 Other chronic pain: Secondary | ICD-10-CM

## 2016-11-29 MED ORDER — ALPRAZOLAM 0.25 MG PO TABS: 0.2500 mg | ORAL_TABLET | Freq: Every evening | ORAL | 5 refills | Status: DC | PRN

## 2016-11-29 MED ORDER — LOSARTAN POTASSIUM 50 MG PO TABS: 50.0000 mg | ORAL_TABLET | Freq: Every morning | ORAL | 3 refills | Status: DC

## 2016-11-29 NOTE — Patient Instructions (Addendum)
Medicare Annual Wellness Visit  Danny [name] and the medical providers at [facility] strive to bring you the best medical care.  In doing so we not only want to address your current medical conditions and concerns but also to detect new conditions early and prevent illness, disease and health-related problems.    Medicare offers a yearly Wellness Visit which allows our clinical staff to assess your need for preventative services including immunizations, lifestyle education, counseling to decrease risk of preventable diseases and screening for fall risk and other medical concerns.    This visit is provided free of charge (no copay) for all Medicare recipients. The clinical pharmacists at [facility] have begun to conduct these Wellness Visits which will also include a thorough review of all your medications.    As you primary medical provider recommend that you make an appointment for your Annual Wellness Visit if you have not done so already this year.  You may set up this appointment before you leave today or you may call back ([phone]) and schedule an appointment.  Please make sure when you call that you mention that you are scheduling your Annual Wellness Visit with the clinical pharmacist so that the appointment may be made for the proper length of time.     Continue current medications. Continue good therapeutic lifestyle changes which include good diet and exercise. Fall precautions discussed with patient. If an FOBT was given today- please return it to our front desk. If you are over 34 years old - you may need Prevnar 20 or the adult Pneumonia vaccine.  **Flu shots are available--- please call and schedule a FLU-CLINIC appointment**  After your visit with [place] today you will receive a survey in the mail or online from [employer] regarding your care with [place]. Please take a moment to fill this out. Your feedback is very  important to [place] as you can help [place] better understand your patient needs as well as improve your experience and satisfaction. WE CARE ABOUT YOU!!!   Continue to make special efforts at watching diet more closely and getting plenty of exercising and reducing the LDL see cholesterol to a goal of less than 70. If you continue to have problems with the left knee, get back in touch with [place] and we will make arrangements for you to see the orthopedic surgeon that comes to this office Do not forget to keep your 26-month appointments with Dr. Liliane Channel, your cardiologist Also do not forget to get your Doppler studies that will further evaluate your circulation as planned by the cardiologist Walking is good for circulation and also good for arthritis

## 2016-11-29 NOTE — Progress Notes (Signed)
Subjective:    Patient ID: Danny Loron., male    DOB: 1931/01/27, 83 y.o.   MRN: [id number]  HPI Pt here for follow up and management of chronic medical problem which includes a fib, hyperlipidemia and hypertension. He is taking medication regularly.  The patient is doing well overall.  He has no specific complaints.  He has had blood work done and we will review this with him during the visit today.  All of his liver function tests were normal.  The CBC was within normal limits with a normal white count good hemoglobin and adequate platelet count.  The blood sugar was good at 90 and a creatinine and electrolytes were within normal limits.  Cholesterol numbers with traditional lipid testing had a triglyceride that was good at 64.  The LDL C was more elevated at 105 and should be less than 70.  Total cholesterol was elevated at 203.  The vitamin D level was good at 77.  She is requesting refills on 2 of his medicines.  His weight is up about 3 pounds and the remainder of his vital signs are stable.  He will be given an FOBT to return.  He will also get a pro time today.  He is getting the pro time because of his history of atrial fibrillation.  She is pleasant and alert.  He is concerned about his pro time recently being 1.9 and we will recheck that again for him today.  He is currently on Coumadin 5 mg daily.  He recently had his eyes checked and he has macular degeneration and he is followed regularly every 3 months by Dr. Hassell Done in [place].  He also complains of arthritis in his knees with the left being worse than the right.  He recently saw an orthopedic specialist about 1 year ago.  No shots were given at that time.  He says this is limited his physical activity somewhat but would rather restart his physical activity and if the pain continues to bother him he will get back in touch with [place] and we will schedule for a follow-up visit with orthopedic specialist that comes to this office.  He denies any  chest pain tightness or shortness of breath.  He denies any trouble with his intestinal tract including swallowing nausea vomiting diarrhea blood in the stool or black tarry bowel movements.  He says his bowel habits are stable.  He is passing his water okay but it is slow and he has nocturia.  He is requesting refills on his blood pressure medicine and Xanax.      Patient Active Problem List   Diagnosis Date Noted  . Atrial fibrillation ([place]) [I48.91] 08/01/2016  . Long term (current) use of anticoagulants [Z79.01] 08/01/2016  . Hematest positive stools   . Dysphagia, pharyngoesophageal phase   . Heme + stool 02/27/2014  . Constipation 02/27/2014  . Dysphagia 02/27/2014  . Thoracic aorta atherosclerosis ([place]) 01/14/2014  . 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 Bilateral carotid bruits 05/27/2013  . Enteritis due to Clostridium difficile, history of 11/21/2012  . Hyperplasia of prostate 05/18/2010  . Carotid stenosis 05/18/2010  . CAROTID BRUIT 01/06/2010  . HYPERCHOLESTEROLEMIA  IIA 05/19/2008  . SYNCOPE-CAROTID SINUS 05/19/2008  . BLURRED VISION 05/19/2008  . Essential hypertension 05/19/2008  . CAD, ARTERY BYPASS GRAFT 05/19/2008  . 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 ALPRAZolam Duanne Moron)  0.25 MG tablet Take 1 Tablet by mouth at bedtime as needed  . Cholecalciferol (VITAMIN D-3 PO) Take 2,000 mg by mouth every morning.   [name] losartan (COZAAR) 50 MG tablet TAKE 1 TABLET EVERY MORNING  . lovastatin (MEVACOR) 40 MG tablet Take 1 tablet (40 mg total) by mouth at bedtime.  . metoprolol tartrate (LOPRESSOR) 25 MG tablet TAKE 1/2 TABLET TWICE DAILY  . Multiple Vitamin (MULTIVITAMIN) tablet Take 1 tablet by mouth every morning.   . nitroGLYCERIN (NITROSTAT) 0.4 MG SL tablet PLACE 1 TABLET (0.4 MG TOTAL) UNDER THE TONGUE EVERY 5 (FIVE) MINUTES AS NEEDED FOR CHEST PAIN.  [name] Omega-3 Fatty Acids  (FISH OIL) 1000 MG CAPS Take 2 capsules by mouth every morning.   [name] omeprazole (PRILOSEC) 20 MG capsule TAKE 1 CAPSULE TWICE DAILY 30 MINUTES PRIOR TO BREAKFAST AND SUPPER  . Probiotic Product (ALIGN) 4 MG CAPS Take 4 mg by mouth daily as needed.   . warfarin (COUMADIN) 5 MG tablet Take 1 tablet (5 mg total) by mouth daily.   No facility-administered encounter medications on file as of 11/29/2016.      Review of Systems  Constitutional: Negative.   HENT: Negative.   Eyes: Negative.   Respiratory: Negative.   Cardiovascular: Negative.   Gastrointestinal: Negative.   Endocrine: Negative.   Genitourinary: Negative.   Musculoskeletal: Negative.   Skin: Negative.   Allergic/Immunologic: Negative.   Neurological: Negative.   Hematological: Negative.   Psychiatric/Behavioral: Negative.        Objective:   Physical Exam  Constitutional: He is oriented to person, place, and time. He appears well-developed and well-nourished. No distress.  Patient is pleasant and alert and a good historian.  HENT:  Head: Normocephalic and atraumatic.  Right Ear: External ear normal.  Left Ear: External ear normal.  Nose: Nose normal.  Mouth/Throat: Oropharynx is clear and moist. No oropharyngeal exudate.  Eyes: Conjunctivae and EOM are normal. Pupils are equal, round, and reactive to light. Right eye exhibits no discharge. Left eye exhibits no discharge. No scleral icterus.  He sees the ophthalmologist regularly.  Neck: Normal range of motion. Neck supple. No thyromegaly present.  Left supraclavicular bruit and right carotid bruit  Cardiovascular: Normal rate.  Murmur heard. Decreased pulses right wrist and both lower extremities the heart is irregular irregular at 84/min  Pulmonary/Chest: Effort normal and breath sounds normal. No respiratory distress. He has no wheezes. He has no rales. He exhibits no tenderness.  Clear anteriorly and posteriorly and no axillary adenopathy  Abdominal: Soft. Bowel  sounds are normal. He exhibits no mass. There is no tenderness. There is no rebound and no guarding.  No abdominal tenderness masses or organ enlargement bruits or inguinal adenopathy  Genitourinary: Rectum normal.  Musculoskeletal: Normal range of motion. He exhibits no edema.  No pain in either knee joint with pressure being applied to joint lines fairly good mobility with patient on table.  Lymphadenopathy:    He has no cervical adenopathy.  Neurological: He is alert and oriented to person, place, and time. He has normal reflexes. No cranial nerve deficit.  Skin: Skin is warm and dry. No rash noted.  Psychiatric: He has a normal mood and affect. His behavior is normal. Judgment and thought content normal.  Nursing note and vitals reviewed.  BP (!) 133/52 (BP Location: Left Arm)   Pulse (!) 57   Temp (!) 97.2 F (36.2 C) (Oral)   Ht 6\' 2"  (1.88 m)   Wt 165 lb (  74.8 kg)   BMI 21.18 kg/m         Assessment & Plan:  1. Persistent atrial fibrillation (HCC) -Continue to follow-up with cardiology every 6 months - Protime-INR  2. HYPERCHOLESTEROLEMIA  IIA -Try to do better with diet and exercise.  This may be the difference is why your cholesterol has gone up and it is no longer at goal.  The LDL C should be 70 or less  3. Essential hypertension -Blood pressure is good today and he should continue with current treatment  4. Thoracic aorta atherosclerosis ([place]) -Continue with current statin therapy but add more exercise to the therapeutic lifestyle regimen  5. Vitamin D deficiency -Continue vitamin D replacement  6. Atherosclerosis of coronary artery bypass graft of native heart without angina pectoris -Continue aggressive therapeutic lifestyle changes and current statin therapy  7. Long term (current) use of anticoagulants [Z79.01] -He is on anticoagulant therapy because of chronic atrial fibrillation.  We will call him with the results of the pro time tomorrow  8. Chronic  pain of both knees -The patient will try to walk again and if he develops more severe pain in his knees he will call us and we will set up an appointment with orthopedic surgeon it comes to this visit to further evaluate him again.  9. Bilateral carotid bruits -Follow-up with cardiology as planned and get Dopplers as planned in February  Meds ordered this encounter  Medications  . losartan (COZAAR) 50 MG tablet    Sig: Take 1 tablet (50 mg total) every morning by mouth.    Dispense:  90 tablet    Refill:  3  . ALPRAZolam (XANAX) 0.25 MG tablet    Sig: Take 1 tablet (0.25 mg total) at bedtime as needed by mouth.    Dispense:  30 tablet    Refill:  5    This prescription was filled on 10/27/2016. Any refills authorized will be placed on file.   Patient Instructions                       Medicare Annual Wellness Visit  [facility] and the medical providers at [facility] strive to bring you the best medical care.  In doing so we not only want to address your current medical conditions and concerns but also to detect new conditions early and prevent illness, disease and health-related problems.    Medicare offers a yearly Wellness Visit which allows our clinical staff to assess your need for preventative services including immunizations, lifestyle education, counseling to decrease risk of preventable diseases and screening for fall risk and other medical concerns.    This visit is provided free of charge (no copay) for all Medicare recipients. The clinical pharmacists at [facility] have begun to conduct these Wellness Visits which will also include a thorough review of all your medications.    As you primary medical provider recommend that you make an appointment for your Annual Wellness Visit if you have not done so already this year.  You may set up this appointment before you leave today or you may call back ([phone]) and schedule an  appointment.  Please make sure when you call that you mention that you are scheduling your Annual Wellness Visit with the clinical pharmacist so that the appointment may be made for the proper length of time.     Continue current medications. Continue good therapeutic lifestyle changes which include good diet  and exercise. Fall precautions discussed with patient. If an FOBT was given today- please return it to our front desk. If you are over 30 years old - you may need Prevnar 8 or the adult Pneumonia vaccine.  **Flu shots are available--- please call and schedule a FLU-CLINIC appointment**  After your visit with [place] today you will receive a survey in the mail or online from [employer] regarding your care with [place]. Please take a moment to fill this out. Your feedback is very important to [place] as you can help [place] better understand your patient needs as well as improve your experience and satisfaction. WE CARE ABOUT YOU!!!   Continue to make special efforts at watching diet more closely and getting plenty of exercising and reducing the LDL see cholesterol to a goal of less than 70. If you continue to have problems with the left knee, get back in touch with [place] and we will make arrangements for you to see the orthopedic surgeon that comes to this office Do not forget to keep your 81-month appointments with Dr. Liliane Channel, your cardiologist Also do not forget to get your Doppler studies that will further evaluate your circulation as planned by the cardiologist Walking is good for circulation and also good for arthritis    Arrie Senate MD

## 2016-12-09 ENCOUNTER — Ambulatory Visit: Payer: Medicare HMO | Admitting: Pharmacist Clinician (PhC)/ Clinical Pharmacy Specialist

## 2016-12-09 DIAGNOSIS — I4891 Unspecified atrial fibrillation: Secondary | ICD-10-CM | POA: Diagnosis not present

## 2016-12-09 DIAGNOSIS — Z7901 Long term (current) use of anticoagulants: Secondary | ICD-10-CM

## 2016-12-09 LAB — COAGUCHEK XS/INR WAIVED
INR: 1.4 — AB (ref 0.9–1.1)
PROTHROMBIN TIME: 17 s

## 2016-12-09 NOTE — Patient Instructions (Signed)
Description   Change directions to 1 1/2 tablets today and tomorrow and then follow these directions:  1 tablet every day and 1 1/2 tablets Monday and Fridays.  INR is 1.4 today  ( too thick today) Your goal is 2.0-3)

## 2016-12-30 ENCOUNTER — Ambulatory Visit (INDEPENDENT_AMBULATORY_CARE_PROVIDER_SITE_OTHER): Payer: Medicare HMO | Admitting: Pharmacist Clinician (PhC)/ Clinical Pharmacy Specialist

## 2016-12-30 DIAGNOSIS — Z7901 Long term (current) use of anticoagulants: Secondary | ICD-10-CM | POA: Diagnosis not present

## 2016-12-30 DIAGNOSIS — I4891 Unspecified atrial fibrillation: Secondary | ICD-10-CM

## 2016-12-30 DIAGNOSIS — E871 Hypo-osmolality and hyponatremia: Secondary | ICD-10-CM | POA: Diagnosis not present

## 2016-12-30 LAB — COAGUCHEK XS/INR WAIVED
INR: 2 — ABNORMAL HIGH (ref 0.9–1.1)
PROTHROMBIN TIME: 23.8 s

## 2016-12-30 NOTE — Patient Instructions (Signed)
Description   Continue taking 1 tablet a day except on Mondays and Fridays take 1 1/2 tablets  INR is 2.0 today Your goal is 2.0-3)

## 2016-12-31 LAB — BMP8+EGFR
BUN/Creatinine Ratio: 13 (ref 10–24)
BUN: 11 mg/dL (ref 8–27)
CHLORIDE: 97 mmol/L (ref 96–106)
CO2: 24 mmol/L (ref 20–29)
Calcium: 9.4 mg/dL (ref 8.6–10.2)
Creatinine, Ser: 0.84 mg/dL (ref 0.76–1.27)
GFR calc Af Amer: 94 mL/min/{1.73_m2} (ref >59)
GFR, EST NON AFRICAN AMERICAN: 81 mL/min/{1.73_m2} (ref >59)
GLUCOSE: 89 mg/dL (ref 65–99)
POTASSIUM: 4.7 mmol/L (ref 3.5–5.2)
SODIUM: 137 mmol/L (ref 134–144)

## 2017-02-17 ENCOUNTER — Ambulatory Visit (INDEPENDENT_AMBULATORY_CARE_PROVIDER_SITE_OTHER): Payer: Medicare HMO | Admitting: Pharmacist Clinician (PhC)/ Clinical Pharmacy Specialist

## 2017-02-17 DIAGNOSIS — Z7901 Long term (current) use of anticoagulants: Secondary | ICD-10-CM

## 2017-02-17 DIAGNOSIS — I4891 Unspecified atrial fibrillation: Secondary | ICD-10-CM

## 2017-02-17 LAB — COAGUCHEK XS/INR WAIVED
INR: 1.6 — ABNORMAL HIGH (ref 0.9–1.1)
Prothrombin Time: 18.7 s

## 2017-02-17 NOTE — Patient Instructions (Signed)
Description   Take 1 1/2 tablet tomorrow too, then continue taking 1 tablet a day except on Mondays and Fridays take 1 1/2 tablets.  INR is 1.6 today Your goal is 2.0-3)

## 2017-02-23 ENCOUNTER — Other Ambulatory Visit: Payer: Self-pay | Admitting: Family Medicine

## 2017-03-16 DIAGNOSIS — H524 Presbyopia: Secondary | ICD-10-CM | POA: Diagnosis not present

## 2017-03-16 DIAGNOSIS — H3561 Retinal hemorrhage, right eye: Secondary | ICD-10-CM | POA: Diagnosis not present

## 2017-03-16 DIAGNOSIS — Z961 Presence of intraocular lens: Secondary | ICD-10-CM | POA: Diagnosis not present

## 2017-03-16 DIAGNOSIS — H353131 Nonexudative age-related macular degeneration, bilateral, early dry stage: Secondary | ICD-10-CM | POA: Diagnosis not present

## 2017-03-27 ENCOUNTER — Ambulatory Visit (INDEPENDENT_AMBULATORY_CARE_PROVIDER_SITE_OTHER): Payer: Medicare HMO | Admitting: *Deleted

## 2017-03-27 VITALS — BP 147/59 | HR 49 | Temp 96.9°F | Ht 74.0 in | Wt 167.0 lb

## 2017-03-27 DIAGNOSIS — Z0000 Encounter for general adult medical examination without abnormal findings: Secondary | ICD-10-CM

## 2017-03-27 NOTE — Patient Instructions (Signed)
  Danny [name] , Thank you for taking time to come for your Medicare Wellness Visit. I appreciate your ongoing commitment to your health goals. Please review the following plan we discussed and let me know if I can assist you in the future.   These are the goals we discussed: Goals    . Prevent falls     Stay active        This is a list of the screening recommended for you and due dates:  Health Maintenance  Topic Date Due  . Tetanus Vaccine  08/29/2022  . Flu Shot  Completed  . Pneumonia vaccines  Completed    Keep follow up with Dr Laurance Flatten and other specialists Think about filling out the advanced directives

## 2017-03-27 NOTE — Progress Notes (Signed)
Subjective:   Danny [name]. is a 84 y.o. male who presents for Medicare Annual/Subsequent preventive examination. He is retired from [employer] and he enjoys wood working and carpentry work. For exercise, he walks and he also states that his diet is semi-healthy. He is involved in church. He lives at home with his wife and he has one son that lives local. They have 2 cats and fall risks were discussed today.    Cardiac Risk Factors include: advanced age (>60men, >66 women);hypertension     Objective:    Vitals: BP (!) 147/59 (BP Location: Left Arm)   Pulse (!) 49   Temp (!) 96.9 F (36.1 C) (Oral)   Ht 6\' 2"  (1.88 m)   Wt 167 lb (75.8 kg)   BMI 21.44 kg/m   Body mass index is 21.44 kg/m.  Advanced Directives 03/27/2017 05/05/2016 04/11/2014  Does Patient Have a Medical Advance Directive? No No No  Would patient like information on creating a medical advance directive? Yes (MAU/Ambulatory/Procedural Areas - Information given) No - Patient declined No - patient declined information    Tobacco Social History   Tobacco Use  Smoking Status Former Smoker  . Types: Cigarettes  . Start date: 01/24/1950  . Last attempt to quit: 01/24/1961  . Years since quitting: 56.2  Smokeless Tobacco Never Used     Counseling given: Not Answered   Clinical Intake:                       Past Medical History:  Diagnosis Date  . Anxiety   . Atrial fibrillation ([place])   . C. difficile colitis   . GERD (gastroesophageal reflux disease)   . Hyperlipidemia   . Hypertension   . Vitamin D deficiency    Past Surgical History:  Procedure Laterality Date  . CARDIOVERSION N/A 05/05/2016   Procedure: CARDIOVERSION;  Surgeon: Josue Hector, MD;  Location: [facility];  Service: Cardiovascular;  Laterality: N/A;  . CAROTID ENDARTERECTOMY Left 2003  . CAROTID-SUBCLAVIAN BYPASS GRAFT Right   . COLONOSCOPY  2005   hyperplastic polyp x 1 with no adenoma  . COLONOSCOPY N/A 04/11/2014   GYJ:EHUD diverticulosis in the sigmoid colon/left colon is redundant  . CORONARY ARTERY BYPASS GRAFT    . ESOPHAGEAL DILATION N/A 04/11/2014   Procedure: ESOPHAGEAL DILATION;  Surgeon: [name], MD;  [facility];  Service: Endoscopy;  Laterality: N/A;  . ESOPHAGOGASTRODUODENOSCOPY N/A 04/11/2014   JSH:FWYOVZCHY at the gastro junction/moderate erosive/duodenal web  . EYE SURGERY Bilateral    cataracts  . SKIN CANCER EXCISION     Family History  Problem Relation Age of Onset  . Heart disease Mother   . Stroke Mother   . Stroke Father   . Diabetes Son   . Stroke Son   . Other Sister        ruptured appendix   . Cancer Brother        lung cancer / asbestos  . Mental illness Sister   . Kidney disease Sister   . Colon cancer Neg Hx        "not that I know of."   Social History   Socioeconomic History  . Marital status: Married    Spouse name: ruby  . Number of children: 1  . Years of education: None  . Highest education level: None  Social Needs  . Financial resource strain: None  . Food insecurity - worry: None  .  Food insecurity - inability: None  . Transportation needs - medical: None  . Transportation needs - non-medical: None  Occupational History  . Occupation: retired    Fish farm manager: [employer]  Tobacco Use  . Smoking status: Former Smoker    Types: Cigarettes    Start date: 01/24/1950    Last attempt to quit: 01/24/1961    Years since quitting: 56.2  . Smokeless tobacco: Never Used  Substance and Sexual Activity  . Alcohol use: Yes    Alcohol/week: 0.0 oz    Comment: not drank since 12/2009  . Drug use: No  . Sexual activity: Yes    Partners: Female  Other Topics Concern  . None  Social History Narrative  . None    Outpatient Encounter Medications as of 03/27/2017  Medication Sig  . ALPRAZolam (XANAX) 0.25 MG tablet Take 1 tablet (0.25 mg total) at bedtime as needed by mouth.  . Cholecalciferol (VITAMIN D-3 PO) Take 2,000 mg by mouth every  morning.   [name] losartan (COZAAR) 50 MG tablet TAKE 1 TABLET EVERY MORNING  . lovastatin (MEVACOR) 40 MG tablet Take 1 tablet (40 mg total) by mouth at bedtime.  . metoprolol tartrate (LOPRESSOR) 25 MG tablet TAKE 1/2 TABLET TWICE DAILY  . Multiple Vitamin (MULTIVITAMIN) tablet Take 1 tablet by mouth every morning.   . Omega-3 Fatty Acids (FISH OIL) 1000 MG CAPS Take 2 capsules by mouth every morning.   [name] omeprazole (PRILOSEC) 20 MG capsule TAKE 1 CAPSULE TWICE DAILY 30 MINUTES PRIOR TO BREAKFAST AND SUPPER  . Probiotic Product (ALIGN) 4 MG CAPS Take 4 mg by mouth daily as needed.   . warfarin (COUMADIN) 5 MG tablet TAKE 1 TABLET EVERY DAY  . nitroGLYCERIN (NITROSTAT) 0.4 MG SL tablet PLACE 1 TABLET (0.4 MG TOTAL) UNDER THE TONGUE EVERY 5 (FIVE) MINUTES AS NEEDED FOR CHEST PAIN. (Patient not taking: Reported on 03/27/2017)   No facility-administered encounter medications on file as of 03/27/2017.     Activities of Daily Living In your present state of health, do you have any difficulty performing the following activities: 03/27/2017  Hearing? N  Vision? Y  Comment rx glasses   Difficulty concentrating or making decisions? N  Walking or climbing stairs? N  Dressing or bathing? N  Doing errands, shopping? N  Preparing Food and eating ? N  Using the Toilet? N  In the past six months, have you accidently leaked urine? N  Do you have problems with loss of bowel control? N  Managing your Medications? N  Managing your Finances? N  Housekeeping or managing your Housekeeping? N  Some recent data might be hidden    Patient Care Team: Chipper Herb, MD as PCP - General (Family Medicine) Josue Hector, MD as Attending Physician (Cardiology) [name], MD as Consulting Physician (Gastroenterology) Okey Regal, OD (Optometry) Rexene Alberts, MD as Consulting Physician (Cardiothoracic Surgery)   Assessment:   This is a routine wellness examination for Danny [name].  Exercise Activities and  Dietary recommendations Current Exercise Habits: Home exercise routine, Type of exercise: walking, Time (Minutes): 30, Frequency (Times/Week): 5, Weekly Exercise (Minutes/Week): 150, Intensity: Mild, Exercise limited by: None identified  Goals    . Prevent falls     Stay active        Fall Risk Fall Risk  03/27/2017 11/29/2016 09/09/2016 07/20/2016 04/07/2016  Falls in the past year? No No Yes No No  Number falls in past yr: - - 1 - -  Is the patient's home free of loose throw rugs in walkways, pet beds, electrical cords, etc?   Fall risks and hazards were discussed today    Depression Screen PHQ 2/9 Scores 03/27/2017 11/29/2016 09/09/2016 07/20/2016  PHQ - 2 Score 0 0 0 0    Cognitive Function MMSE - Mini Mental State Exam 03/27/2017  Orientation to time 5  Orientation to Place 5  Registration 3  Attention/ Calculation 5  Recall 0  Language- name 2 objects 2  Language- repeat 1  Language- follow 3 step command 3  Language- read & follow direction 1  Write a sentence 1  Copy design 1  Total score 27        Immunization History  Administered Date(s) Administered  . Influenza Whole 09/24/2009  . Influenza, High Dose Seasonal PF 10/22/2015, 11/07/2016  . Influenza,inj,Quad PF,6+ Mos 10/24/2012, 11/04/2013, 11/05/2014  . Pneumococcal Conjugate-13 10/24/2012  . Pneumococcal Polysaccharide-23 02/24/2005  . Td 01/24/2005  . Tdap 08/28/2012  . Zoster 06/25/2006    Qualifies for Shingles Vaccine? He has had the zostavax, declined the shingrix.  Screening Tests Health Maintenance  Topic Date Due  . TETANUS/TDAP  08/29/2022  . INFLUENZA VACCINE  Completed  . PNA vac Low Risk Adult  Completed   Cancer Screenings: Lung: Low Dose CT Chest recommended if Age 65-80 years, 30 pack-year currently smoking OR have quit w/in 15years. Patient does qualify. Colorectal: due at ov this month   Additional Screenings: declined  Hepatitis B/HIV/Syphillis: Hepatitis C Screening:       Plan:   pt is to keep follow up with DR Laurance Flatten and other specialists He was given info on advanced directives.  I have personally reviewed and noted the following in the patient's chart:   . Medical and social history . Use of alcohol, tobacco or illicit drugs  . Current medications and supplements . Functional ability and status . Nutritional status . Physical activity . Advanced directives . List of other physicians . Hospitalizations, surgeries, and ER visits in previous 12 months . Vitals . Screenings to include cognitive, depression, and falls . Referrals and appointments  In addition, I have reviewed and discussed with patient certain preventive protocols, quality metrics, and best practice recommendations. A written personalized care plan for preventive services as well as general preventive health recommendations were provided to patient.     Sherica Paternostro, Cameron Proud, LPN  05/27/6642  I have reviewed and agree with the above AWV documentation.   Assunta Found, MD [facility] 03/29/2017

## 2017-03-31 ENCOUNTER — Ambulatory Visit (INDEPENDENT_AMBULATORY_CARE_PROVIDER_SITE_OTHER): Payer: Medicare HMO | Admitting: Pharmacist Clinician (PhC)/ Clinical Pharmacy Specialist

## 2017-03-31 DIAGNOSIS — I4891 Unspecified atrial fibrillation: Secondary | ICD-10-CM | POA: Diagnosis not present

## 2017-03-31 DIAGNOSIS — Z7901 Long term (current) use of anticoagulants: Secondary | ICD-10-CM

## 2017-03-31 LAB — COAGUCHEK XS/INR WAIVED
INR: 1 (ref 0.9–1.1)
PROTHROMBIN TIME: 12 s

## 2017-03-31 NOTE — Patient Instructions (Signed)
Description   Take 1 1/2 tablets every day of the week.  INR is 1.0  Too thick today Your goal is 2.0-3.0

## 2017-04-05 ENCOUNTER — Other Ambulatory Visit: Payer: Medicare HMO

## 2017-04-05 DIAGNOSIS — Z1211 Encounter for screening for malignant neoplasm of colon: Secondary | ICD-10-CM | POA: Diagnosis not present

## 2017-04-07 ENCOUNTER — Ambulatory Visit (INDEPENDENT_AMBULATORY_CARE_PROVIDER_SITE_OTHER): Payer: Medicare HMO | Admitting: Pharmacist Clinician (PhC)/ Clinical Pharmacy Specialist

## 2017-04-07 DIAGNOSIS — I10 Essential (primary) hypertension: Secondary | ICD-10-CM

## 2017-04-07 DIAGNOSIS — R195 Other fecal abnormalities: Secondary | ICD-10-CM | POA: Diagnosis not present

## 2017-04-07 DIAGNOSIS — Z7901 Long term (current) use of anticoagulants: Secondary | ICD-10-CM | POA: Diagnosis not present

## 2017-04-07 DIAGNOSIS — E7800 Pure hypercholesterolemia, unspecified: Secondary | ICD-10-CM | POA: Diagnosis not present

## 2017-04-07 DIAGNOSIS — I4891 Unspecified atrial fibrillation: Secondary | ICD-10-CM

## 2017-04-07 LAB — COAGUCHEK XS/INR WAIVED
INR: 1.3 — AB (ref 0.9–1.1)
Prothrombin Time: 15.6 s

## 2017-04-07 NOTE — Patient Instructions (Signed)
Description   Take 1 1/2 tablets every day of the week.  INR is 1.3 too thick today Your goal is 2.0-3.0

## 2017-04-08 LAB — CBC WITH DIFFERENTIAL/PLATELET
Basophils Absolute: 0 10*3/uL (ref 0.0–0.2)
Basos: 1 %
EOS (ABSOLUTE): 0.1 10*3/uL (ref 0.0–0.4)
EOS: 2 %
HEMATOCRIT: 40.2 % (ref 37.5–51.0)
HEMOGLOBIN: 13.4 g/dL (ref 13.0–17.7)
IMMATURE GRANULOCYTES: 0 %
Immature Grans (Abs): 0 10*3/uL (ref 0.0–0.1)
LYMPHS ABS: 1.7 10*3/uL (ref 0.7–3.1)
Lymphs: 31 %
MCH: 31.9 pg (ref 26.6–33.0)
MCHC: 33.3 g/dL (ref 31.5–35.7)
MCV: 96 fL (ref 79–97)
MONOCYTES: 13 %
Monocytes Absolute: 0.7 10*3/uL (ref 0.1–0.9)
NEUTROS PCT: 53 %
Neutrophils Absolute: 2.9 10*3/uL (ref 1.4–7.0)
Platelets: 264 10*3/uL (ref 150–379)
RBC: 4.2 x10E6/uL (ref 4.14–5.80)
RDW: 13.8 % (ref 12.3–15.4)
WBC: 5.5 10*3/uL (ref 3.4–10.8)

## 2017-04-08 LAB — BMP8+EGFR
BUN / CREAT RATIO: 15 (ref 10–24)
BUN: 14 mg/dL (ref 8–27)
CHLORIDE: 98 mmol/L (ref 96–106)
CO2: 25 mmol/L (ref 20–29)
Calcium: 9 mg/dL (ref 8.6–10.2)
Creatinine, Ser: 0.94 mg/dL (ref 0.76–1.27)
GFR calc non Af Amer: 74 mL/min/{1.73_m2} (ref >59)
GFR, EST AFRICAN AMERICAN: 86 mL/min/{1.73_m2} (ref >59)
GLUCOSE: 97 mg/dL (ref 65–99)
POTASSIUM: 4.5 mmol/L (ref 3.5–5.2)
SODIUM: 136 mmol/L (ref 134–144)

## 2017-04-08 LAB — LIPID PANEL
CHOL/HDL RATIO: 2.6 ratio (ref 0.0–5.0)
CHOLESTEROL TOTAL: 153 mg/dL (ref 100–199)
HDL: 59 mg/dL (ref >39)
LDL CALC: 77 mg/dL (ref 0–99)
TRIGLYCERIDES: 86 mg/dL (ref 0–149)
VLDL Cholesterol Cal: 17 mg/dL (ref 5–40)

## 2017-04-08 LAB — FECAL OCCULT BLOOD, IMMUNOCHEMICAL: Fecal Occult Bld: NEGATIVE

## 2017-04-08 LAB — HEPATIC FUNCTION PANEL
ALT: 15 IU/L (ref 0–44)
AST: 17 IU/L (ref 0–40)
Albumin: 4.1 g/dL (ref 3.5–4.7)
Alkaline Phosphatase: 50 IU/L (ref 39–117)
Bilirubin Total: 0.3 mg/dL (ref 0.0–1.2)
Bilirubin, Direct: 0.15 mg/dL (ref 0.00–0.40)
TOTAL PROTEIN: 6.3 g/dL (ref 6.0–8.5)

## 2017-04-19 ENCOUNTER — Encounter: Payer: Self-pay | Admitting: Family Medicine

## 2017-04-19 ENCOUNTER — Ambulatory Visit (INDEPENDENT_AMBULATORY_CARE_PROVIDER_SITE_OTHER): Payer: Medicare HMO | Admitting: Family Medicine

## 2017-04-19 VITALS — BP 111/45 | HR 51 | Temp 96.6°F | Ht 74.0 in | Wt 165.0 lb

## 2017-04-19 DIAGNOSIS — Z7901 Long term (current) use of anticoagulants: Secondary | ICD-10-CM

## 2017-04-19 DIAGNOSIS — I4891 Unspecified atrial fibrillation: Secondary | ICD-10-CM | POA: Diagnosis not present

## 2017-04-19 DIAGNOSIS — I714 Abdominal aortic aneurysm, without rupture, unspecified: Secondary | ICD-10-CM

## 2017-04-19 DIAGNOSIS — I739 Peripheral vascular disease, unspecified: Secondary | ICD-10-CM | POA: Diagnosis not present

## 2017-04-19 DIAGNOSIS — E559 Vitamin D deficiency, unspecified: Secondary | ICD-10-CM

## 2017-04-19 DIAGNOSIS — I10 Essential (primary) hypertension: Secondary | ICD-10-CM

## 2017-04-19 DIAGNOSIS — N400 Enlarged prostate without lower urinary tract symptoms: Secondary | ICD-10-CM | POA: Diagnosis not present

## 2017-04-19 DIAGNOSIS — E7800 Pure hypercholesterolemia, unspecified: Secondary | ICD-10-CM | POA: Diagnosis not present

## 2017-04-19 DIAGNOSIS — I700 Atherosclerosis of aorta: Secondary | ICD-10-CM | POA: Diagnosis not present

## 2017-04-19 LAB — MICROSCOPIC EXAMINATION
Bacteria, UA: NONE SEEN
Epithelial Cells (non renal): NONE SEEN /hpf (ref 0–10)
WBC UA: NONE SEEN /HPF (ref 0–5)

## 2017-04-19 LAB — URINALYSIS, COMPLETE
BILIRUBIN UA: NEGATIVE
Glucose, UA: NEGATIVE
KETONES UA: NEGATIVE
LEUKOCYTES UA: NEGATIVE
NITRITE UA: NEGATIVE
PH UA: 7 (ref 5.0–7.5)
Protein, UA: NEGATIVE
Specific Gravity, UA: 1.015 (ref 1.005–1.030)
Urobilinogen, Ur: 0.2 mg/dL (ref 0.2–1.0)

## 2017-04-19 LAB — COAGUCHEK XS/INR WAIVED
INR: 2.4 — ABNORMAL HIGH (ref 0.9–1.1)
Prothrombin Time: 29.3 s

## 2017-04-19 MED ORDER — LOVASTATIN 40 MG PO TABS: 40.0000 mg | ORAL_TABLET | Freq: Every day | ORAL | 3 refills | Status: DC

## 2017-04-19 MED ORDER — METOPROLOL TARTRATE 25 MG PO TABS: 12.5000 mg | ORAL_TABLET | Freq: Two times a day (BID) | ORAL | 3 refills | Status: DC

## 2017-04-19 MED ORDER — WARFARIN SODIUM 5 MG PO TABS: ORAL_TABLET | ORAL | 3 refills | Status: DC

## 2017-04-19 MED ORDER — ALPRAZOLAM 0.25 MG PO TABS: 0.2500 mg | ORAL_TABLET | Freq: Every evening | ORAL | 1 refills | Status: DC | PRN

## 2017-04-19 NOTE — Patient Instructions (Addendum)
Medicare Annual Wellness Visit  [facility] and the medical providers at [facility] strive to bring you the best medical care.  In doing so we not only want to address your current medical conditions and concerns but also to detect new conditions early and prevent illness, disease and health-related problems.    Medicare offers a yearly Wellness Visit which allows our clinical staff to assess your need for preventative services including immunizations, lifestyle education, counseling to decrease risk of preventable diseases and screening for fall risk and other medical concerns.    This visit is provided free of charge (no copay) for all Medicare recipients. The clinical pharmacists at [facility] have begun to conduct these Wellness Visits which will also include a thorough review of all your medications.    As you primary medical provider recommend that you make an appointment for your Annual Wellness Visit if you have not done so already this year.  You may set up this appointment before you leave today or you may call back ([phone]) and schedule an appointment.  Please make sure when you call that you mention that you are scheduling your Annual Wellness Visit with the clinical pharmacist so that the appointment may be made for the proper length of time.    Continue current medications. Continue good therapeutic lifestyle changes which include good diet and exercise. Fall precautions discussed with patient. If an FOBT was given today- please return it to our front desk. If you are over 82 years old - you may need Prevnar 53 or the adult Pneumonia vaccine.  **Flu shots are available--- please call and schedule a FLU-CLINIC appointment**  After your visit with [place] today you will receive a survey in the mail or online from [employer] regarding your care with [place]. Please take a moment to fill this out. Your feedback is very  important to [place] as you can help [place] better understand your patient needs as well as improve your experience and satisfaction. WE CARE ABOUT YOU!!!  Use Lamisil cream twice daily between the toes of the right foot for 10-14 days.  If the rash does not improve call us back and we will do a referral to the dermatologist. Continue to follow-up with cardiology as planned As the weather warms try to get out and get more walking and continue with current statin therapy and therapeutic lifestyle changes We will call with the results of the urinalysis as soon as those results become available

## 2017-04-19 NOTE — Progress Notes (Signed)
Subjective:    Patient ID: Danny Loron., male    DOB: 1931/04/19, 84 y.o.   MRN: [id number]  HPI Pt here for follow up and management of chronic medical problems which includes a fib, hypertension and hyperlipidemia.  The patient is doing well overall and does complain with some rash on his right foot.  He also is requesting refills on several of his medicines.  He has had lab work done and we will review this with him during the visit today.  He is also due to get a rectal exam.  INR this morning was good and he will continue with his current treatment regimen.  The patient CBC was within normal limits.  Hemoglobin was good at 13.4 platelet count was adequate and the white count was not elevated.  The blood sugar was good creatinine was good and all electrolytes were normal.  All liver function tests were normal.  Cholesterol numbers were excellent although the LDL C could be a little bit lower but it is improved from the previous visit.  Triglycerides are good and the HDL was good.  He will continue with his current treatment regimen along with aggressive therapeutic lifestyle changes.  The INR today was also good he will continue with his Coumadin as he is currently doing.  The patient is pleasant and doing well.  He has had some muscle aches and pains but has been burning wood and bringing logs into the house for burning.  He denies any chest pain pressure or tightness but has had the general arthritic stuff that is worse with arising from a sitting and laying position.  It gets better after moving around.  He denies any nausea vomiting diarrhea or change in bowel habits and he is passing his water well other than it than it is slow voiding at times.    Patient Active Problem List   Diagnosis Date Noted  . Atrial fibrillation ([place]) [I48.91] 08/01/2016  . Long term (current) use of anticoagulants [Z79.01] 08/01/2016  . Hematest positive stools   . Dysphagia, pharyngoesophageal phase   . Heme  + stool 02/27/2014  . Constipation 02/27/2014  . Dysphagia 02/27/2014  . Thoracic aorta atherosclerosis ([place]) 01/14/2014  . 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 Bilateral carotid bruits 05/27/2013  . Enteritis due to Clostridium difficile, history of 11/21/2012  . Hyperplasia of prostate 05/18/2010  . Carotid stenosis 05/18/2010  . CAROTID BRUIT 01/06/2010  . HYPERCHOLESTEROLEMIA  IIA 05/19/2008  . SYNCOPE-CAROTID SINUS 05/19/2008  . BLURRED VISION 05/19/2008  . Essential hypertension 05/19/2008  . CAD, ARTERY BYPASS GRAFT 05/19/2008  . 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 ALPRAZolam (XANAX) 0.25 MG tablet Take 1 tablet (0.25 mg total) at bedtime as needed by mouth.  . Cholecalciferol (VITAMIN D-3 PO) Take 2,000 mg by mouth every morning.   [name] losartan (COZAAR) 50 MG tablet TAKE 1 TABLET EVERY MORNING  . lovastatin (MEVACOR) 40 MG tablet Take 1 tablet (40 mg total) by mouth at bedtime.  . metoprolol tartrate (LOPRESSOR) 25 MG tablet TAKE 1/2 TABLET TWICE DAILY  . Multiple Vitamin (MULTIVITAMIN) tablet Take 1 tablet by mouth every morning.   . nitroGLYCERIN (NITROSTAT) 0.4 MG SL tablet PLACE 1 TABLET (0.4 MG TOTAL) UNDER THE TONGUE EVERY 5 (FIVE) MINUTES AS NEEDED FOR CHEST PAIN.  [name] Omega-3 Fatty Acids (FISH OIL) 1000 MG CAPS Take 2 capsules by mouth  every morning.   [name] omeprazole (PRILOSEC) 20 MG capsule TAKE 1 CAPSULE TWICE DAILY 30 MINUTES PRIOR TO BREAKFAST AND SUPPER  . Probiotic Product (ALIGN) 4 MG CAPS Take 4 mg by mouth daily as needed.   . warfarin (COUMADIN) 5 MG tablet TAKE 1 TABLET EVERY DAY   No facility-administered encounter medications on file as of 04/19/2017.      Review of Systems  Constitutional: Negative.   HENT: Negative.   Eyes: Negative.   Respiratory: Negative.   Cardiovascular: Negative.   Gastrointestinal: Negative.   Endocrine: Negative.   Genitourinary:  Negative.   Musculoskeletal: Negative.   Skin: Negative.        Area on right foot - itches  Allergic/Immunologic: Negative.   Neurological: Negative.   Hematological: Negative.   Psychiatric/Behavioral: Negative.        Objective:   Physical Exam  Constitutional: He is oriented to person, place, and time. He appears well-developed and well-nourished. No distress.  The patient is calm pleasant and alert  HENT:  Head: Normocephalic and atraumatic.  Right Ear: External ear normal.  Left Ear: External ear normal.  Nose: Nose normal.  Mouth/Throat: Oropharynx is clear and moist. No oropharyngeal exudate.  Eyes: Pupils are equal, round, and reactive to light. Conjunctivae and EOM are normal. Right eye exhibits no discharge. Left eye exhibits no discharge. No scleral icterus.  Neck: Normal range of motion. Neck supple. No thyromegaly present.  No thyromegaly.  Right carotid bruit and right supraclavicular bruit with diminished pulses compared to the left on the right side.  Cardiovascular: Normal rate and normal heart sounds.  No murmur heard. Heart is slightly irregular at 60/min.  There were diminished pulses in both feet and the right radius.  Pulmonary/Chest: Effort normal and breath sounds normal. No respiratory distress. He has no wheezes. He has no rales. He exhibits no tenderness.  Clear anteriorly and posteriorly and no axillary adenopathy  Abdominal: Soft. Bowel sounds are normal. He exhibits no mass. There is no tenderness. There is no rebound and no guarding.  No abdominal tenderness masses bruits or inguinal adenopathy with slightly diminished inguinal pulses.  Genitourinary: Rectum normal, prostate normal and penis normal.  Genitourinary Comments: The prostate is enlarged and there were no rectal masses.  There was no prostate nodules or lumps.  The external genitalia were within normal limits with no inguinal hernias being palpated.  Musculoskeletal: Normal range of motion.  He exhibits no edema.  Lymphadenopathy:    He has no cervical adenopathy.  Neurological: He is alert and oriented to person, place, and time. He has normal reflexes. No cranial nerve deficit.  Skin: Skin is warm and dry. Rash noted. There is erythema. No pallor.  There was rash and erythema between the toes of the right foot.  KOH prep was negative.  The rash looks fungal and we will treated as a fungus anyway.  He will get back in touch with [place] in a couple of weeks if it is not better and we will do a referral to the dermatologist.  Psychiatric: He has a normal mood and affect. His behavior is normal. Judgment and thought content normal.  Nursing note and vitals reviewed.   BP (!) 111/45 (BP Location: Left Arm)   Pulse (!) 51   Temp (!) 96.6 F (35.9 C) (Oral)   Ht 6\' 2"  (1.88 m)   Wt 165 lb (74.8 kg)   BMI 21.18 kg/m   KOH from skin scrapings negative for fungus  Assessment & Plan:  1. Essential hypertension -The blood pressure is good with current treatment he will continue with his current treatment.  2. Atrial fibrillation, unspecified type ([place]) -Continue with Coumadin as currently doing and repeat at schedule time pro time - CoaguChek XS/INR Waived  3. HYPERCHOLESTEROLEMIA  IIA -Continue with current treatment and with as aggressive therapeutic lifestyle changes as possible  4. Vitamin D deficiency -Continue with vitamin D replacement  5. Thoracic aorta atherosclerosis ([place]) -Continue with aggressive therapeutic lifestyle changes and statin drug.  6. Benign prostatic hyperplasia, unspecified whether lower urinary tract symptoms present -The prostate is enlarged with no lumps or masses.  He does have BPH symptoms with slow voiding. - Urinalysis, Complete  7. Peripheral vascular insufficiency ([facility]) -The patient denies any claudication symptoms although he has decreased pulses in both feet and the right radius.  We will continue to monitor his symptoms with this.   He is scheduled for carotid Dopplers and lower extremity Doppler soon with his cardiologist.  8. Intermittent claudication ([place]) -Today, the patient does not complain with any claudication symptoms.  9. AAA (abdominal aortic aneurysm) without rupture ([facility]) -Continue to follow-up with cardiology  10. Long term (current) use of anticoagulants -Continue with Coumadin as currently doing and recheck pro time as planned   Meds ordered this encounter  Medications  . warfarin (COUMADIN) 5 MG tablet    Sig: Take 1- 1.5 tabs po qd as directed    Dispense:  135 tablet    Refill:  3  . lovastatin (MEVACOR) 40 MG tablet    Sig: Take 1 tablet (40 mg total) by mouth at bedtime.    Dispense:  90 tablet    Refill:  3  . metoprolol tartrate (LOPRESSOR) 25 MG tablet    Sig: Take 0.5 tablets (12.5 mg total) by mouth 2 (two) times daily.    Dispense:  90 tablet    Refill:  3  . ALPRAZolam (XANAX) 0.25 MG tablet    Sig: Take 1 tablet (0.25 mg total) by mouth at bedtime as needed.    Dispense:  90 tablet    Refill:  1    This prescription was filled on 10/27/2016. Any refills authorized will be placed on file.   Patient Instructions                       Medicare Annual Wellness Visit  [facility] and the medical providers at [facility] strive to bring you the best medical care.  In doing so we not only want to address your current medical conditions and concerns but also to detect new conditions early and prevent illness, disease and health-related problems.    Medicare offers a yearly Wellness Visit which allows our clinical staff to assess your need for preventative services including immunizations, lifestyle education, counseling to decrease risk of preventable diseases and screening for fall risk and other medical concerns.    This visit is provided free of charge (no copay) for all Medicare recipients. The clinical pharmacists at [facility] have  begun to conduct these Wellness Visits which will also include a thorough review of all your medications.    As you primary medical provider recommend that you make an appointment for your Annual Wellness Visit if you have not done so already this year.  You may set up this appointment before you leave today or you may call back ([phone]) and schedule an appointment.  Please  make sure when you call that you mention that you are scheduling your Annual Wellness Visit with the clinical pharmacist so that the appointment may be made for the proper length of time.    Continue current medications. Continue good therapeutic lifestyle changes which include good diet and exercise. Fall precautions discussed with patient. If an FOBT was given today- please return it to our front desk. If you are over 40 years old - you may need Prevnar 23 or the adult Pneumonia vaccine.  **Flu shots are available--- please call and schedule a FLU-CLINIC appointment**  After your visit with [place] today you will receive a survey in the mail or online from [employer] regarding your care with [place]. Please take a moment to fill this out. Your feedback is very important to [place] as you can help [place] better understand your patient needs as well as improve your experience and satisfaction. WE CARE ABOUT YOU!!!  Use Lamisil cream twice daily between the toes of the right foot for 10-14 days.  If the rash does not improve call us back and we will do a referral to the dermatologist. Continue to follow-up with cardiology as planned As the weather warms try to get out and get more walking and continue with current statin therapy and therapeutic lifestyle changes We will call with the results of the urinalysis as soon as those results become available   Arrie Senate MD

## 2017-05-05 ENCOUNTER — Ambulatory Visit (INDEPENDENT_AMBULATORY_CARE_PROVIDER_SITE_OTHER): Payer: Medicare HMO | Admitting: Pharmacist Clinician (PhC)/ Clinical Pharmacy Specialist

## 2017-05-05 DIAGNOSIS — Z7901 Long term (current) use of anticoagulants: Secondary | ICD-10-CM | POA: Diagnosis not present

## 2017-05-05 DIAGNOSIS — R300 Dysuria: Secondary | ICD-10-CM

## 2017-05-05 DIAGNOSIS — I4891 Unspecified atrial fibrillation: Secondary | ICD-10-CM

## 2017-05-05 LAB — MICROSCOPIC EXAMINATION
BACTERIA UA: NONE SEEN
Epithelial Cells (non renal): NONE SEEN /hpf (ref 0–10)
RBC MICROSCOPIC, UA: NONE SEEN /HPF (ref 0–2)
RENAL EPITHEL UA: NONE SEEN /HPF
WBC UA: NONE SEEN /HPF (ref 0–5)

## 2017-05-05 LAB — URINALYSIS, COMPLETE
Bilirubin, UA: NEGATIVE
Glucose, UA: NEGATIVE
Ketones, UA: NEGATIVE
Leukocytes, UA: NEGATIVE
Nitrite, UA: NEGATIVE
PH UA: 7 (ref 5.0–7.5)
PROTEIN UA: NEGATIVE
RBC, UA: NEGATIVE
Specific Gravity, UA: 1.01 (ref 1.005–1.030)
UUROB: 0.2 mg/dL (ref 0.2–1.0)

## 2017-05-05 LAB — COAGUCHEK XS/INR WAIVED
INR: 3.3 — AB (ref 0.9–1.1)
PROTHROMBIN TIME: 39.1 s

## 2017-05-05 NOTE — Patient Instructions (Signed)
Description   Change to taking 1 tablet daily except for Mondays, Wednesdays, and Fridays take 1 1/2 tablets  INR is 3.3 today  Little thin today Your goal is 2.0-3.0

## 2017-05-06 LAB — URINE CULTURE: Organism ID, Bacteria: NO GROWTH

## 2017-06-01 ENCOUNTER — Other Ambulatory Visit: Payer: Self-pay | Admitting: Family Medicine

## 2017-06-02 ENCOUNTER — Ambulatory Visit (INDEPENDENT_AMBULATORY_CARE_PROVIDER_SITE_OTHER): Payer: Medicare HMO | Admitting: Pharmacist Clinician (PhC)/ Clinical Pharmacy Specialist

## 2017-06-02 DIAGNOSIS — I4891 Unspecified atrial fibrillation: Secondary | ICD-10-CM | POA: Diagnosis not present

## 2017-06-02 DIAGNOSIS — Z7901 Long term (current) use of anticoagulants: Secondary | ICD-10-CM | POA: Diagnosis not present

## 2017-06-02 LAB — COAGUCHEK XS/INR WAIVED
INR: 3 — AB (ref 0.9–1.1)
PROTHROMBIN TIME: 35.5 s

## 2017-06-02 NOTE — Patient Instructions (Signed)
Description   Continue taking 1 tablet daily except for Mondays, Wednesdays, and Fridays take 1 1/2 tablets  INR is 3.0 today   Your goal is 2.0-3.0

## 2017-06-26 ENCOUNTER — Other Ambulatory Visit: Payer: Self-pay | Admitting: Family Medicine

## 2017-07-21 ENCOUNTER — Ambulatory Visit (INDEPENDENT_AMBULATORY_CARE_PROVIDER_SITE_OTHER): Payer: Medicare HMO | Admitting: Pharmacist Clinician (PhC)/ Clinical Pharmacy Specialist

## 2017-07-21 DIAGNOSIS — I4891 Unspecified atrial fibrillation: Secondary | ICD-10-CM | POA: Diagnosis not present

## 2017-07-21 DIAGNOSIS — Z7901 Long term (current) use of anticoagulants: Secondary | ICD-10-CM

## 2017-07-21 LAB — COAGUCHEK XS/INR WAIVED
INR: 3.1 — AB (ref 0.9–1.1)
Prothrombin Time: 37.2 s

## 2017-07-21 NOTE — Patient Instructions (Signed)
Description   Change to taking 1 1/2 tablets on Mondays and Fridays and 1 tablet all other days of the week  INR is 3.1 today   Your goal is 2.0-3.0

## 2017-07-24 ENCOUNTER — Other Ambulatory Visit: Payer: Self-pay | Admitting: Family Medicine

## 2017-07-28 ENCOUNTER — Ambulatory Visit (HOSPITAL_COMMUNITY)
Admission: RE | Admit: 2017-07-28 | Discharge: 2017-07-28 | Disposition: A | Discharge status: Home / Self Care | Payer: Medicare HMO | Source: Ambulatory Visit | Attending: Cardiovascular Disease | Admitting: Cardiovascular Disease

## 2017-07-28 DIAGNOSIS — G458 Other transient cerebral ischemic attacks and related syndromes: Secondary | ICD-10-CM | POA: Insufficient documentation

## 2017-07-28 DIAGNOSIS — R0989 Other specified symptoms and signs involving the circulatory and respiratory systems: Secondary | ICD-10-CM | POA: Diagnosis not present

## 2017-07-31 ENCOUNTER — Encounter: Payer: Self-pay | Admitting: *Deleted

## 2017-08-07 ENCOUNTER — Other Ambulatory Visit: Payer: Self-pay | Admitting: Family Medicine

## 2017-08-29 ENCOUNTER — Other Ambulatory Visit: Payer: Medicare HMO

## 2017-08-29 DIAGNOSIS — E7800 Pure hypercholesterolemia, unspecified: Secondary | ICD-10-CM | POA: Diagnosis not present

## 2017-08-29 DIAGNOSIS — I10 Essential (primary) hypertension: Secondary | ICD-10-CM

## 2017-08-29 DIAGNOSIS — I714 Abdominal aortic aneurysm, without rupture, unspecified: Secondary | ICD-10-CM

## 2017-08-30 LAB — CBC WITH DIFFERENTIAL/PLATELET
BASOS: 1 %
Basophils Absolute: 0.1 10*3/uL (ref 0.0–0.2)
EOS (ABSOLUTE): 0.3 10*3/uL (ref 0.0–0.4)
Eos: 5 %
Hematocrit: 44.7 % (ref 37.5–51.0)
Hemoglobin: 14.1 g/dL (ref 13.0–17.7)
IMMATURE GRANS (ABS): 0 10*3/uL (ref 0.0–0.1)
Immature Granulocytes: 0 %
LYMPHS: 27 %
Lymphocytes Absolute: 1.7 10*3/uL (ref 0.7–3.1)
MCH: 30.6 pg (ref 26.6–33.0)
MCHC: 31.5 g/dL (ref 31.5–35.7)
MCV: 97 fL (ref 79–97)
MONOS ABS: 0.7 10*3/uL (ref 0.1–0.9)
Monocytes: 12 %
NEUTROS ABS: 3.3 10*3/uL (ref 1.4–7.0)
NEUTROS PCT: 55 %
Platelets: 266 10*3/uL (ref 150–450)
RBC: 4.61 x10E6/uL (ref 4.14–5.80)
RDW: 14.4 % (ref 12.3–15.4)
WBC: 6 10*3/uL (ref 3.4–10.8)

## 2017-08-30 LAB — BMP8+EGFR
BUN / CREAT RATIO: 12 (ref 10–24)
BUN: 11 mg/dL (ref 8–27)
CALCIUM: 9.2 mg/dL (ref 8.6–10.2)
CHLORIDE: 95 mmol/L — AB (ref 96–106)
CO2: 24 mmol/L (ref 20–29)
CREATININE: 0.92 mg/dL (ref 0.76–1.27)
GFR calc Af Amer: 88 mL/min/{1.73_m2} (ref >59)
GFR calc non Af Amer: 76 mL/min/{1.73_m2} (ref >59)
GLUCOSE: 79 mg/dL (ref 65–99)
Potassium: 4.3 mmol/L (ref 3.5–5.2)
Sodium: 135 mmol/L (ref 134–144)

## 2017-08-30 LAB — HEPATIC FUNCTION PANEL
ALT: 14 IU/L (ref 0–44)
AST: 19 IU/L (ref 0–40)
Albumin: 4.4 g/dL (ref 3.5–4.7)
Alkaline Phosphatase: 47 IU/L (ref 39–117)
BILIRUBIN, DIRECT: 0.16 mg/dL (ref 0.00–0.40)
Bilirubin Total: 0.5 mg/dL (ref 0.0–1.2)
Total Protein: 6.3 g/dL (ref 6.0–8.5)

## 2017-08-30 LAB — LIPID PANEL
CHOLESTEROL TOTAL: 194 mg/dL (ref 100–199)
Chol/HDL Ratio: 2.5 ratio (ref 0.0–5.0)
HDL: 77 mg/dL (ref >39)
LDL Calculated: 104 mg/dL — ABNORMAL HIGH (ref 0–99)
Triglycerides: 65 mg/dL (ref 0–149)
VLDL Cholesterol Cal: 13 mg/dL (ref 5–40)

## 2017-09-01 ENCOUNTER — Ambulatory Visit (INDEPENDENT_AMBULATORY_CARE_PROVIDER_SITE_OTHER): Payer: Medicare HMO | Admitting: Family Medicine

## 2017-09-01 ENCOUNTER — Encounter: Payer: Self-pay | Admitting: Family Medicine

## 2017-09-01 VITALS — BP 129/53 | HR 51 | Temp 97.0°F | Ht 74.0 in | Wt 171.0 lb

## 2017-09-01 DIAGNOSIS — E7800 Pure hypercholesterolemia, unspecified: Secondary | ICD-10-CM | POA: Diagnosis not present

## 2017-09-01 DIAGNOSIS — I4891 Unspecified atrial fibrillation: Secondary | ICD-10-CM

## 2017-09-01 DIAGNOSIS — I10 Essential (primary) hypertension: Secondary | ICD-10-CM | POA: Diagnosis not present

## 2017-09-01 DIAGNOSIS — D473 Essential (hemorrhagic) thrombocythemia: Secondary | ICD-10-CM

## 2017-09-01 DIAGNOSIS — I700 Atherosclerosis of aorta: Secondary | ICD-10-CM | POA: Diagnosis not present

## 2017-09-01 DIAGNOSIS — Z7901 Long term (current) use of anticoagulants: Secondary | ICD-10-CM

## 2017-09-01 DIAGNOSIS — D75839 Thrombocytosis, unspecified: Secondary | ICD-10-CM

## 2017-09-01 DIAGNOSIS — E559 Vitamin D deficiency, unspecified: Secondary | ICD-10-CM

## 2017-09-01 DIAGNOSIS — N401 Enlarged prostate with lower urinary tract symptoms: Secondary | ICD-10-CM | POA: Diagnosis not present

## 2017-09-01 DIAGNOSIS — N400 Enlarged prostate without lower urinary tract symptoms: Secondary | ICD-10-CM | POA: Diagnosis not present

## 2017-09-01 MED ORDER — ALPRAZOLAM 0.25 MG PO TABS: 0.2500 mg | ORAL_TABLET | Freq: Every evening | ORAL | 5 refills | Status: DC | PRN

## 2017-09-01 NOTE — Patient Instructions (Addendum)
Medicare Annual Wellness Visit  [facility] and the medical providers at [facility] strive to bring you the best medical care.  In doing so we not only want to address your current medical conditions and concerns but also to detect new conditions early and prevent illness, disease and health-related problems.    Medicare offers a yearly Wellness Visit which allows our clinical staff to assess your need for preventative services including immunizations, lifestyle education, counseling to decrease risk of preventable diseases and screening for fall risk and other medical concerns.    This visit is provided free of charge (no copay) for all Medicare recipients. The clinical pharmacists at [facility] have begun to conduct these Wellness Visits which will also include a thorough review of all your medications.    As you primary medical provider recommend that you make an appointment for your Annual Wellness Visit if you have not done so already this year.  You may set up this appointment before you leave today or you may call back ([phone]) and schedule an appointment.  Please make sure when you call that you mention that you are scheduling your Annual Wellness Visit with the clinical pharmacist so that the appointment may be made for the proper length of time.     Continue current medications. Continue good therapeutic lifestyle changes which include good diet and exercise. Fall precautions discussed with patient. If an FOBT was given today- please return it to our front desk. If you are over 20 years old - you may need Prevnar 47 or the adult Pneumonia vaccine.  **Flu shots are available--- please call and schedule a FLU-CLINIC appointment**  After your visit with [place] today you will receive a survey in the mail or online from [employer] regarding your care with [place]. Please take a moment to fill this out. Your feedback is very  important to [place] as you can help [place] better understand your patient needs as well as improve your experience and satisfaction. WE CARE ABOUT YOU!!!   Continue to follow-up with cardiology as planned Get protimes as regulated by the clinical pharmacist.  Description   Continue taking 1 1/2 tablets on Mondays and Fridays and 1 tablet all other days of the week  INR is 2.5 today   Your goal is 2.0-3.0

## 2017-09-01 NOTE — Progress Notes (Signed)
Subjective:    Patient ID: Danny [name]., male    DOB: 1931/08/27, 84 y.o.   MRN: [id number]  HPI Pt here for follow up and management of chronic medical problems which includes hypertension and hyperlipidemia. He is taking medication regularly.  The patient is doing well today with no specific complaints.  He has had lab work done and we will review this with him during the visit today.  He will get a pro time after this visit by the clinical pharmacist.  He is wanting a refill on his Xanax.  Cholesterol numbers with traditional lipid testing have an LDL-C cholesterol that is slightly elevated at 104 but should be less than 70 because of atherosclerotic heart disease.  And blockages.  The triglycerides are good and the good cholesterol is excellent.  He has a tendency to get lax with his diet and exercise and then his LDL-C has a tendency to go up we will remind him of that today.  The blood sugar is good at 79 the creatinine, the most important kidney function test is normal all of the electrolytes are good except the chloride is slightly decreased.  The potassium is within normal limits.  The CBC has a normal white blood cell count a stable hemoglobin and an adequate platelet count.  All liver function tests are normal.  She is pleasant as usual and indicates he had recent carotid Dopplers and warts told that they were stable compared to the past.  He sees the cardiologist, Dr. Liliane Channel every 6 months or so.  The patient today is requesting a refill on his Xanax.  He denies any chest pain pressure or tightness.  He denies any trouble with swallowing heartburn indigestion nausea vomiting diarrhea blood in the stool black tarry bowel movements or change in bowel habits and he continues to take his omeprazole once daily every morning.  He does continue to complain of slowness with voiding.  We are going to have him go on a visit with [facility] to see if they can offer some suggestions to help with  his enlarged prostate and symptoms with voiding.    Patient Active Problem List   Diagnosis Date Noted  . Atrial fibrillation ([place]) [I48.91] 08/01/2016  . Long term (current) use of anticoagulants [Z79.01] 08/01/2016  . Hematest positive stools   . Dysphagia, pharyngoesophageal phase   . Heme + stool 02/27/2014  . Constipation 02/27/2014  . Dysphagia 02/27/2014  . Thoracic aorta atherosclerosis ([place]) 01/14/2014  . 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 Bilateral carotid bruits 05/27/2013  . Enteritis due to Clostridium difficile, history of 11/21/2012  . Hyperplasia of prostate 05/18/2010  . Carotid stenosis 05/18/2010  . CAROTID BRUIT 01/06/2010  . HYPERCHOLESTEROLEMIA  IIA 05/19/2008  . SYNCOPE-CAROTID SINUS 05/19/2008  . BLURRED VISION 05/19/2008  . Essential hypertension 05/19/2008  . CAD, ARTERY BYPASS GRAFT 05/19/2008  . 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 ALPRAZolam (XANAX) 0.25 MG tablet TAKE 1 TABLET BY MOUTH AT BEDTIME AS NEEDED  . Cholecalciferol (VITAMIN D-3 PO) Take 2,000 mg by mouth every morning.   [name] losartan (COZAAR) 50 MG tablet TAKE 1 TABLET EVERY MORNING  . lovastatin (MEVACOR) 40 MG tablet Take 1 tablet (40 mg total) by mouth at bedtime.  . metoprolol tartrate (LOPRESSOR) 25 MG tablet Take 0.5 tablets (12.5 mg total) by mouth 2 (two) times daily.  . Multiple  Vitamin (MULTIVITAMIN) tablet Take 1 tablet by mouth every morning.   . nitroGLYCERIN (NITROSTAT) 0.4 MG SL tablet PLACE 1 TABLET (0.4 MG TOTAL) UNDER THE TONGUE EVERY 5 (FIVE) MINUTES AS NEEDED FOR CHEST PAIN.  [name] Omega-3 Fatty Acids (FISH OIL) 1000 MG CAPS Take 2 capsules by mouth every morning.   [name] omeprazole (PRILOSEC) 20 MG capsule TAKE 1 CAPSULE TWICE DAILY 30 MINUTES PRIOR TO BREAKFAST AND SUPPER  . Probiotic Product (ALIGN) 4 MG CAPS Take 4 mg by mouth daily as needed.   . warfarin (COUMADIN) 5 MG tablet  Take 1- 1.5 tabs po qd as directed   No facility-administered encounter medications on file as of 09/01/2017.       Review of Systems  Constitutional: Negative.   HENT: Negative.   Eyes: Negative.   Respiratory: Negative.   Cardiovascular: Negative.   Gastrointestinal: Negative.   Endocrine: Negative.   Genitourinary: Negative.   Musculoskeletal: Negative.   Skin: Negative.   Allergic/Immunologic: Negative.   Neurological: Negative.   Hematological: Negative.   Psychiatric/Behavioral: Negative.        Objective:   Physical Exam  Constitutional: He is oriented to person, place, and time. He appears well-developed and well-nourished. No distress.  The patient is pleasant, relaxed and has not been exercising as much in usual and will try to do better with exercising especially as the weather cools down.  HENT:  Head: Normocephalic and atraumatic.  Right Ear: External ear normal.  Left Ear: External ear normal.  Nose: Nose normal.  Mouth/Throat: Oropharynx is clear and moist. No oropharyngeal exudate.  Eyes: Pupils are equal, round, and reactive to light. Conjunctivae and EOM are normal. Right eye exhibits no discharge. No scleral icterus.  Neck: Normal range of motion. Neck supple. No thyromegaly present.  Right carotid bruit and right supraclavicular bruit.  No anterior cervical adenopathy or thyromegaly.  Cardiovascular: Normal rate, regular rhythm and normal heart sounds.  No murmur heard. The heart today has a regular rate and rhythm at 72/min.  Distal pulses were difficult to palpate.  Pulmonary/Chest: Effort normal and breath sounds normal. He has no wheezes. He has no rales. He exhibits no tenderness.  Clear anteriorly and posteriorly no axillary adenopathy.  Abdominal: Soft. Bowel sounds are normal. He exhibits no mass. There is no tenderness.  No abdominal tenderness masses organ enlargement or bruits.  Musculoskeletal: Normal range of motion. He exhibits no edema.    Lymphadenopathy:    He has no cervical adenopathy.  Neurological: He is alert and oriented to person, place, and time. He has normal reflexes. No cranial nerve deficit.  Reflexes were 2+ and equal bilaterally  Skin: Skin is warm and dry. No rash noted.  Psychiatric: He has a normal mood and affect. His behavior is normal. Judgment and thought content normal.  Moood affect and behavior were normal.  Nursing note and vitals reviewed.   BP (!) 129/53 (BP Location: Left Arm)   Pulse (!) 51   Temp (!) 97 F (36.1 C) (Oral)   Ht 6\' 2"  (1.88 m)   Wt 171 lb (77.6 kg)   BMI 21.96 kg/m        Assessment & Plan:  1. Thoracic aorta atherosclerosis ([place]) -10 you with aggressive therapeutic lifestyle changes.  Try to exercise more.  Continue with statin.  2. Vitamin D deficiency -Continue with vitamin D replacement  3. HYPERCHOLESTEROLEMIA  IIA -Try to do better with exercise  4. Essential hypertension -Blood pressure is good today  and continue with current treatment  5. Benign prostatic hyperplasia, unspecified whether lower urinary tract symptoms present -Refer to urology because of slowness with voiding  6. Atrial fibrillation, unspecified type [facility]) -Follow-up with cardiology as planned - CoaguChek XS/INR Waived  7. Benign prostatic hyperplasia with lower urinary tract symptoms, symptom details unspecified - Ambulatory referral to Urology  8. Thrombocytosis ([facility]) -Platelet count was normal today.  Meds ordered this encounter  Medications  . ALPRAZolam (XANAX) 0.25 MG tablet    Sig: Take 1 tablet (0.25 mg total) by mouth at bedtime as needed.    Dispense:  30 tablet    Refill:  5    This request is for a new prescription for a controlled substance as required by Federal/State law.   Patient Instructions                       Medicare Annual Wellness Visit  [facility] and the medical providers at [facility] strive to bring you the best medical  care.  In doing so we not only want to address your current medical conditions and concerns but also to detect new conditions early and prevent illness, disease and health-related problems.    Medicare offers a yearly Wellness Visit which allows our clinical staff to assess your need for preventative services including immunizations, lifestyle education, counseling to decrease risk of preventable diseases and screening for fall risk and other medical concerns.    This visit is provided free of charge (no copay) for all Medicare recipients. The clinical pharmacists at [facility] have begun to conduct these Wellness Visits which will also include a thorough review of all your medications.    As you primary medical provider recommend that you make an appointment for your Annual Wellness Visit if you have not done so already this year.  You may set up this appointment before you leave today or you may call back ([phone]) and schedule an appointment.  Please make sure when you call that you mention that you are scheduling your Annual Wellness Visit with the clinical pharmacist so that the appointment may be made for the proper length of time.     Continue current medications. Continue good therapeutic lifestyle changes which include good diet and exercise. Fall precautions discussed with patient. If an FOBT was given today- please return it to our front desk. If you are over 63 years old - you may need Prevnar 72 or the adult Pneumonia vaccine.  **Flu shots are available--- please call and schedule a FLU-CLINIC appointment**  After your visit with [place] today you will receive a survey in the mail or online from [employer] regarding your care with [place]. Please take a moment to fill this out. Your feedback is very important to [place] as you can help [place] better understand your patient needs as well as improve your experience and satisfaction. WE CARE ABOUT YOU!!!   Continue to follow-up  with cardiology as planned Get protimes as regulated by the clinical pharmacist.    Arrie Senate MD

## 2017-09-05 LAB — COAGUCHEK XS/INR WAIVED
INR: 2.5 — ABNORMAL HIGH (ref 0.9–1.1)
Prothrombin Time: 30.6 s

## 2017-09-25 ENCOUNTER — Other Ambulatory Visit: Payer: Self-pay | Admitting: Family Medicine

## 2017-10-03 DIAGNOSIS — H3561 Retinal hemorrhage, right eye: Secondary | ICD-10-CM | POA: Diagnosis not present

## 2017-10-03 DIAGNOSIS — H353131 Nonexudative age-related macular degeneration, bilateral, early dry stage: Secondary | ICD-10-CM | POA: Diagnosis not present

## 2017-10-04 ENCOUNTER — Ambulatory Visit (INDEPENDENT_AMBULATORY_CARE_PROVIDER_SITE_OTHER): Payer: Medicare HMO | Admitting: Pharmacist Clinician (PhC)/ Clinical Pharmacy Specialist

## 2017-10-04 DIAGNOSIS — Z7901 Long term (current) use of anticoagulants: Secondary | ICD-10-CM

## 2017-10-04 DIAGNOSIS — I4891 Unspecified atrial fibrillation: Secondary | ICD-10-CM | POA: Diagnosis not present

## 2017-10-04 LAB — COAGUCHEK XS/INR WAIVED
INR: 4 — ABNORMAL HIGH (ref 0.9–1.1)
PROTHROMBIN TIME: 48 s

## 2017-10-04 NOTE — Patient Instructions (Signed)
Description   No warfarin today and tomorrow then Continue taking 1 1/2 tablets on Mondays and Fridays and 1 tablet all other days of the week  INR is 4.0  (goal is 2-3) too thin today

## 2017-10-10 ENCOUNTER — Other Ambulatory Visit: Payer: Self-pay | Admitting: Family Medicine

## 2017-10-30 ENCOUNTER — Ambulatory Visit (INDEPENDENT_AMBULATORY_CARE_PROVIDER_SITE_OTHER): Payer: Medicare HMO | Admitting: *Deleted

## 2017-10-30 DIAGNOSIS — Z23 Encounter for immunization: Secondary | ICD-10-CM | POA: Diagnosis not present

## 2017-11-01 ENCOUNTER — Ambulatory Visit (INDEPENDENT_AMBULATORY_CARE_PROVIDER_SITE_OTHER): Payer: Medicare HMO | Admitting: Pharmacist Clinician (PhC)/ Clinical Pharmacy Specialist

## 2017-11-01 DIAGNOSIS — I480 Paroxysmal atrial fibrillation: Secondary | ICD-10-CM | POA: Diagnosis not present

## 2017-11-01 DIAGNOSIS — I4891 Unspecified atrial fibrillation: Secondary | ICD-10-CM | POA: Diagnosis not present

## 2017-11-01 DIAGNOSIS — Z7901 Long term (current) use of anticoagulants: Secondary | ICD-10-CM | POA: Diagnosis not present

## 2017-11-01 LAB — COAGUCHEK XS/INR WAIVED
INR: 2.3 — ABNORMAL HIGH (ref 0.9–1.1)
Prothrombin Time: 27.3 s

## 2017-11-01 NOTE — Patient Instructions (Signed)
Description   Continue taking 1 tablet a day except for Mondays and Fridays take 1 1/2 tablets  INR is 2.3  (goal is 2-3)  Perfect reading today!

## 2017-12-13 ENCOUNTER — Ambulatory Visit: Payer: Medicare HMO | Admitting: Pharmacist Clinician (PhC)/ Clinical Pharmacy Specialist

## 2017-12-13 DIAGNOSIS — I4891 Unspecified atrial fibrillation: Secondary | ICD-10-CM

## 2017-12-13 DIAGNOSIS — I480 Paroxysmal atrial fibrillation: Secondary | ICD-10-CM

## 2017-12-13 DIAGNOSIS — Z7901 Long term (current) use of anticoagulants: Secondary | ICD-10-CM

## 2017-12-13 LAB — COAGUCHEK XS/INR WAIVED
INR: 3.5 — AB (ref 0.9–1.1)
Prothrombin Time: 41.9 s

## 2017-12-13 NOTE — Patient Instructions (Signed)
Description   No warfarin today then resume regular schedule tomorrow.  INR is 3.5  (goal is 2-3)  Too thin today

## 2018-01-10 ENCOUNTER — Ambulatory Visit (INDEPENDENT_AMBULATORY_CARE_PROVIDER_SITE_OTHER): Payer: Medicare HMO | Admitting: Pharmacist Clinician (PhC)/ Clinical Pharmacy Specialist

## 2018-01-10 DIAGNOSIS — I4891 Unspecified atrial fibrillation: Secondary | ICD-10-CM

## 2018-01-10 DIAGNOSIS — I482 Chronic atrial fibrillation, unspecified: Secondary | ICD-10-CM

## 2018-01-10 DIAGNOSIS — Z7901 Long term (current) use of anticoagulants: Secondary | ICD-10-CM

## 2018-01-10 LAB — COAGUCHEK XS/INR WAIVED
INR: 2.3 — AB (ref 0.9–1.1)
PROTHROMBIN TIME: 27.8 s

## 2018-01-10 NOTE — Patient Instructions (Signed)
Description   Continue taking warfarin the same way  INR is 2.3  (goal is 2-3)  Perfect reading today

## 2018-01-29 ENCOUNTER — Other Ambulatory Visit: Payer: Medicare HMO

## 2018-01-29 DIAGNOSIS — E7800 Pure hypercholesterolemia, unspecified: Secondary | ICD-10-CM

## 2018-01-29 DIAGNOSIS — R5383 Other fatigue: Secondary | ICD-10-CM | POA: Diagnosis not present

## 2018-01-29 DIAGNOSIS — I482 Chronic atrial fibrillation, unspecified: Secondary | ICD-10-CM

## 2018-01-29 DIAGNOSIS — I10 Essential (primary) hypertension: Secondary | ICD-10-CM | POA: Diagnosis not present

## 2018-01-30 LAB — HEPATIC FUNCTION PANEL
ALT: 17 IU/L (ref 0–44)
AST: 19 IU/L (ref 0–40)
Albumin: 4.3 g/dL (ref 3.5–4.7)
Alkaline Phosphatase: 51 IU/L (ref 39–117)
Bilirubin Total: 0.7 mg/dL (ref 0.0–1.2)
Bilirubin, Direct: 0.27 mg/dL (ref 0.00–0.40)
Total Protein: 6.4 g/dL (ref 6.0–8.5)

## 2018-01-30 LAB — LIPID PANEL
CHOLESTEROL TOTAL: 154 mg/dL (ref 100–199)
Chol/HDL Ratio: 2.2 ratio (ref 0.0–5.0)
HDL: 70 mg/dL (ref >39)
LDL Calculated: 68 mg/dL (ref 0–99)
Triglycerides: 79 mg/dL (ref 0–149)
VLDL Cholesterol Cal: 16 mg/dL (ref 5–40)

## 2018-01-30 LAB — CBC WITH DIFFERENTIAL/PLATELET
BASOS: 1 %
Basophils Absolute: 0.1 10*3/uL (ref 0.0–0.2)
EOS (ABSOLUTE): 0.1 10*3/uL (ref 0.0–0.4)
Eos: 2 %
HEMOGLOBIN: 14.5 g/dL (ref 13.0–17.7)
Hematocrit: 42 % (ref 37.5–51.0)
IMMATURE GRANS (ABS): 0 10*3/uL (ref 0.0–0.1)
Immature Granulocytes: 0 %
LYMPHS: 30 %
Lymphocytes Absolute: 1.4 10*3/uL (ref 0.7–3.1)
MCH: 32 pg (ref 26.6–33.0)
MCHC: 34.5 g/dL (ref 31.5–35.7)
MCV: 93 fL (ref 79–97)
MONOCYTES: 13 %
Monocytes Absolute: 0.6 10*3/uL (ref 0.1–0.9)
NEUTROS ABS: 2.4 10*3/uL (ref 1.4–7.0)
Neutrophils: 54 %
Platelets: 236 10*3/uL (ref 150–450)
RBC: 4.53 x10E6/uL (ref 4.14–5.80)
RDW: 13.1 % (ref 11.6–15.4)
WBC: 4.5 10*3/uL (ref 3.4–10.8)

## 2018-01-30 LAB — BMP8+EGFR
BUN/Creatinine Ratio: 10 (ref 10–24)
BUN: 10 mg/dL (ref 8–27)
CALCIUM: 9.3 mg/dL (ref 8.6–10.2)
CHLORIDE: 98 mmol/L (ref 96–106)
CO2: 24 mmol/L (ref 20–29)
Creatinine, Ser: 1.02 mg/dL (ref 0.76–1.27)
GFR, EST AFRICAN AMERICAN: 78 mL/min/{1.73_m2} (ref >59)
GFR, EST NON AFRICAN AMERICAN: 67 mL/min/{1.73_m2} (ref >59)
Glucose: 96 mg/dL (ref 65–99)
Potassium: 4.1 mmol/L (ref 3.5–5.2)
Sodium: 138 mmol/L (ref 134–144)

## 2018-02-01 ENCOUNTER — Encounter: Payer: Self-pay | Admitting: Family Medicine

## 2018-02-01 ENCOUNTER — Ambulatory Visit (INDEPENDENT_AMBULATORY_CARE_PROVIDER_SITE_OTHER): Payer: Medicare HMO | Admitting: Family Medicine

## 2018-02-01 VITALS — BP 120/69 | HR 89 | Temp 96.9°F | Ht 74.0 in | Wt 173.0 lb

## 2018-02-01 DIAGNOSIS — Z7901 Long term (current) use of anticoagulants: Secondary | ICD-10-CM

## 2018-02-01 DIAGNOSIS — N400 Enlarged prostate without lower urinary tract symptoms: Secondary | ICD-10-CM

## 2018-02-01 DIAGNOSIS — I739 Peripheral vascular disease, unspecified: Secondary | ICD-10-CM | POA: Diagnosis not present

## 2018-02-01 DIAGNOSIS — E7800 Pure hypercholesterolemia, unspecified: Secondary | ICD-10-CM

## 2018-02-01 DIAGNOSIS — I10 Essential (primary) hypertension: Secondary | ICD-10-CM | POA: Diagnosis not present

## 2018-02-01 DIAGNOSIS — I700 Atherosclerosis of aorta: Secondary | ICD-10-CM

## 2018-02-01 DIAGNOSIS — I482 Chronic atrial fibrillation, unspecified: Secondary | ICD-10-CM | POA: Diagnosis not present

## 2018-02-01 DIAGNOSIS — E559 Vitamin D deficiency, unspecified: Secondary | ICD-10-CM

## 2018-02-01 LAB — COAGUCHEK XS/INR WAIVED
INR: 1.7 — ABNORMAL HIGH (ref 0.9–1.1)
Prothrombin Time: 20.5 s

## 2018-02-01 MED ORDER — ALPRAZOLAM 0.25 MG PO TABS: 0.2500 mg | ORAL_TABLET | Freq: Every evening | ORAL | 5 refills | Status: DC | PRN

## 2018-02-01 MED ORDER — OMEPRAZOLE 20 MG PO CPDR: DELAYED_RELEASE_CAPSULE | ORAL | 3 refills | Status: DC

## 2018-02-01 NOTE — Patient Instructions (Addendum)
Medicare Annual Wellness Visit  [facility] and the medical providers at [facility] strive to bring you the best medical care.  In doing so we not only want to address your current medical conditions and concerns but also to detect new conditions early and prevent illness, disease and health-related problems.    Medicare offers a yearly Wellness Visit which allows our clinical staff to assess your need for preventative services including immunizations, lifestyle education, counseling to decrease risk of preventable diseases and screening for fall risk and other medical concerns.    This visit is provided free of charge (no copay) for all Medicare recipients. The clinical pharmacists at [facility] have begun to conduct these Wellness Visits which will also include a thorough review of all your medications.    As you primary medical provider recommend that you make an appointment for your Annual Wellness Visit if you have not done so already this year.  You may set up this appointment before you leave today or you may call back ([phone]) and schedule an appointment.  Please make sure when you call that you mention that you are scheduling your Annual Wellness Visit with the clinical pharmacist so that the appointment may be made for the proper length of time.     Continue current medications. Continue good therapeutic lifestyle changes which include good diet and exercise. Fall precautions discussed with patient. If an FOBT was given today- please return it to our front desk. If you are over 40 years old - you may need Prevnar 85 or the adult Pneumonia vaccine.  **Flu shots are available--- please call and schedule a FLU-CLINIC appointment**  After your visit with [place] today you will receive a survey in the mail or online from [employer] regarding your care with [place]. Please take a moment to fill this out. Your feedback is very  important to [place] as you can help [place] better understand your patient needs as well as improve your experience and satisfaction. WE CARE ABOUT YOU!!!     Description   We will change coumadin dose today - you will take 1.5 tabs on mon, wed, and fri and 1.0 tab all other days   INR is 1.7  (goal is 2-3)  Perfect reading today    Continue to walk and exercise regularly Discuss with the cardiologist the difficulty in finding your pulses in your feet Continue to follow a healthy diet and keep cholesterol under good control Continue to be careful not put yourself at risk for falling

## 2018-02-01 NOTE — Progress Notes (Signed)
Subjective:    Patient ID: Danny [name]., male    DOB: [birth year], 85 y.o.   MRN: [id number]  HPI Pt here for follow up and management of chronic medical problems which includes a fib, hypertension and hyperlipidemia. He is taking medication regularly.  Patient is doing well today overall.  He does complain of some fatigue.  He had his INR done this morning and the INR is 1.7 and slightly on the thick side.  He is currently taking 5 mg pills 1-1/2 on Monday and Friday and 1 pill all the other days.  We will have him increase this and take 1-1/2 on Monday Wednesday and Friday and 1 on all the other days and have him repeat his INR in 4 weeks.  The patient sees Dr. Johnsie Cancel Dr. Oneida Alar and the vascular surgeon, Dr Ricard Dillon.  The patient is pleasant and relaxed today and I reminded him that he is 14 and he reminded me that he is soon to be 87.  He would like to be more active but seems to get more tired more easily.  He does have some muscle aches and soreness in his chest and all over when he cuts wood and fixes his wood fires.  He has some shortness of breath with this type of exertion.  He denies any outright chest pain or shortness of breath other than with excessive physical activity.  He does see his cardiologist in February.  He denies any trouble with his stomach including heartburn indigestion nausea vomiting diarrhea blood in the stool or black tarry bowel movements.  He is passing his water well but sometimes he has more urgency and frequency and slowness with voiding.  His recent lab work was reviewed and his cholesterol numbers his liver function test his CBC and renal profile were all normal.  Because of the fatigue we will see if we can add a thyroid panel..     Patient Active Problem List   Diagnosis Date Noted  . Atrial fibrillation ([place]) [I48.91] 08/01/2016  . Long term (current) use of anticoagulants [Z79.01] 08/01/2016  . Hematest positive stools   . Dysphagia, pharyngoesophageal  phase   . Heme + stool 02/27/2014  . Constipation 02/27/2014  . Dysphagia 02/27/2014  . Thoracic aorta atherosclerosis ([place]) 01/14/2014  . 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 Bilateral carotid bruits 05/27/2013  . Enteritis due to Clostridium difficile, history of 11/21/2012  . Hyperplasia of prostate 05/18/2010  . Carotid stenosis 05/18/2010  . CAROTID BRUIT 01/06/2010  . HYPERCHOLESTEROLEMIA  IIA 05/19/2008  . SYNCOPE-CAROTID SINUS 05/19/2008  . BLURRED VISION 05/19/2008  . Essential hypertension 05/19/2008  . CAD, ARTERY BYPASS GRAFT 05/19/2008  . 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 ALPRAZolam (XANAX) 0.25 MG tablet Take 1 tablet (0.25 mg total) by mouth at bedtime as needed.  . Cholecalciferol (VITAMIN D-3 PO) Take 2,000 mg by mouth every morning.   [name] losartan (COZAAR) 50 MG tablet TAKE 1 TABLET EVERY MORNING  . lovastatin (MEVACOR) 40 MG tablet Take 1 tablet (40 mg total) by mouth at bedtime.  . metoprolol tartrate (LOPRESSOR) 25 MG tablet Take 0.5 tablets (12.5 mg total) by mouth 2 (two) times daily.  . Multiple Vitamin (MULTIVITAMIN) tablet Take 1 tablet by mouth every morning.   . nitroGLYCERIN (NITROSTAT) 0.4 MG SL tablet PLACE 1 TABLET (0.4 MG TOTAL) UNDER THE TONGUE EVERY 5 (FIVE) MINUTES  AS NEEDED FOR CHEST PAIN.  [name] Omega-3 Fatty Acids (FISH OIL) 1000 MG CAPS Take 2 capsules by mouth every morning.   [name] omeprazole (PRILOSEC) 20 MG capsule TAKE 1 CAPSULE TWICE DAILY 30 MINUTES PRIOR TO BREAKFAST AND SUPPER  . Probiotic Product (ALIGN) 4 MG CAPS Take 4 mg by mouth daily as needed.   . warfarin (COUMADIN) 5 MG tablet Take 1- 1.5 tabs po qd as directed   No facility-administered encounter medications on file as of 02/01/2018.      Review of Systems  Constitutional: Positive for fatigue.  HENT: Negative.   Eyes: Negative.   Respiratory: Negative.   Cardiovascular: Negative.     Gastrointestinal: Negative.   Endocrine: Negative.   Genitourinary: Negative.   Musculoskeletal: Negative.   Skin: Negative.   Allergic/Immunologic: Negative.   Neurological: Negative.   Hematological: Negative.   Psychiatric/Behavioral: Negative.        Objective:   Physical Exam Vitals signs and nursing note reviewed.  Constitutional:      Appearance: Normal appearance. He is well-developed and normal weight. He is not ill-appearing.  HENT:     Head: Normocephalic and atraumatic.     Right Ear: Tympanic membrane, ear canal and external ear normal. There is no impacted cerumen.     Left Ear: Tympanic membrane, ear canal and external ear normal. There is no impacted cerumen.     Nose: Nose normal. No congestion.     Mouth/Throat:     Mouth: Mucous membranes are moist.     Pharynx: Oropharynx is clear. No oropharyngeal exudate.     Comments: Patient wears upper and lower dentures Eyes:     General: No scleral icterus.       Right eye: No discharge.        Left eye: No discharge.     Extraocular Movements: Extraocular movements intact.     Conjunctiva/sclera: Conjunctivae normal.     Pupils: Pupils are equal, round, and reactive to light.  Neck:     Musculoskeletal: Normal range of motion and neck supple. No muscular tenderness.     Thyroid: No thyromegaly.     Vascular: Carotid bruit present.     Trachea: No tracheal deviation.     Comments: Patient has bilateral carotid and supraclavicular bruits right worse than left.  There are good pulses radially on both sides.  There are diminished pulses in both feet.  With slight pedal edema. Cardiovascular:     Rate and Rhythm: Normal rate. Rhythm irregular.     Heart sounds: Normal heart sounds. No murmur.     Comments: The heart is irregular irregular at 96/min diminished pedal pulses Pulmonary:     Effort: Pulmonary effort is normal. No respiratory distress.     Breath sounds: Normal breath sounds. No wheezing or rales.      Comments: The chest was clear anteriorly and posteriorly with no axillary adenopathy.  There is no chest wall masses. Chest:     Chest wall: No tenderness.  Abdominal:     General: Abdomen is flat. Bowel sounds are normal.     Palpations: Abdomen is soft. There is no mass.     Tenderness: There is no abdominal tenderness. There is no rebound.     Comments: No masses tenderness organ enlargement or bruits.  Weak inguinal pulses.  No adenopathy inguinal knee.  Musculoskeletal: Normal range of motion.        General: No tenderness.     Right lower leg:  Edema present.     Comments: Pedal edema on the right side.  Patient has a lot of varicosities.  Lymphadenopathy:     Cervical: No cervical adenopathy.  Skin:    General: Skin is warm and dry.     Findings: No rash.  Neurological:     General: No focal deficit present.     Mental Status: He is alert and oriented to person, place, and time. Mental status is at baseline.     Cranial Nerves: No cranial nerve deficit.     Deep Tendon Reflexes: Reflexes are normal and symmetric.  Psychiatric:        Mood and Affect: Mood normal.        Behavior: Behavior normal.        Thought Content: Thought content normal.        Judgment: Judgment normal.     Comments: Patient's mood affect and behavior are normal for him.    BP 120/69 (BP Location: Left Arm)   Pulse 89   Temp (!) 96.9 F (36.1 C) (Oral)   Ht 6\' 2"  (1.88 m)   Wt 173 lb (78.5 kg)   BMI 22.21 kg/m         Assessment & Plan:  1. Chronic atrial fibrillation -Change Coumadin directions as discussed with patient - CoaguChek XS/INR Waived  2. HYPERCHOLESTEROLEMIA  IIA -Cholesterol numbers were excellent and patient will continue with his current treatment regimen and exercise regimen.  3. Essential hypertension -Blood pressure is good today and he will continue with current treatment  4. Vitamin D deficiency -Continue with vitamin D replacement  5. Thoracic aorta  atherosclerosis ([place]) -Continue with exercise and aggressive therapeutic lifestyle changes and statin therapy  6. Benign prostatic hyperplasia, unspecified whether lower urinary tract symptoms present -Patient still has some voiding issues with his urine stream being slow at times.  7. Long term (current) use of anticoagulants -Change Coumadin directions as noted  8. Intermittent claudication ([facility]) -Walk and exercise regularly  9. Peripheral vascular insufficiency ([facility]) -Walking exercise regularly and continue with statin therapy  Meds ordered this encounter  Medications  . ALPRAZolam (XANAX) 0.25 MG tablet    Sig: Take 1 tablet (0.25 mg total) by mouth at bedtime as needed.    Dispense:  30 tablet    Refill:  5    This request is for a new prescription for a controlled substance as required by Federal/State law.  . omeprazole (PRILOSEC) 20 MG capsule    Sig: TAKE 1 CAPSULE TWICE DAILY 30 MINUTES PRIOR TO BREAKFAST AND SUPPER    Dispense:  180 capsule    Refill:  3   Patient Instructions                        Medicare Annual Wellness Visit  [facility] and the medical providers at [facility] strive to bring you the best medical care.  In doing so we not only want to address your current medical conditions and concerns but also to detect new conditions early and prevent illness, disease and health-related problems.    Medicare offers a yearly Wellness Visit which allows our clinical staff to assess your need for preventative services including immunizations, lifestyle education, counseling to decrease risk of preventable diseases and screening for fall risk and other medical concerns.    This visit is provided free of charge (no copay) for all Medicare recipients. The clinical pharmacists at [facility] have begun to conduct these Wellness Visits which will also include a thorough review of all your medications.    As you primary  medical provider recommend that you make an appointment for your Annual Wellness Visit if you have not done so already this year.  You may set up this appointment before you leave today or you may call back ([phone]) and schedule an appointment.  Please make sure when you call that you mention that you are scheduling your Annual Wellness Visit with the clinical pharmacist so that the appointment may be made for the proper length of time.     Continue current medications. Continue good therapeutic lifestyle changes which include good diet and exercise. Fall precautions discussed with patient. If an FOBT was given today- please return it to our front desk. If you are over 3 years old - you may need Prevnar 48 or the adult Pneumonia vaccine.  **Flu shots are available--- please call and schedule a FLU-CLINIC appointment**  After your visit with [place] today you will receive a survey in the mail or online from [employer] regarding your care with [place]. Please take a moment to fill this out. Your feedback is very important to [place] as you can help [place] better understand your patient needs as well as improve your experience and satisfaction. WE CARE ABOUT YOU!!!     Description   We will change coumadin dose today - you will take 1.5 tabs on mon, wed, and fri and 1.0 tab all other days   INR is 1.7  (goal is 2-3)  Perfect reading today    Continue to walk and exercise regularly Discuss with the cardiologist the difficulty in finding your pulses in your feet Continue to follow a healthy diet and keep cholesterol under good control Continue to be careful not put yourself at risk for falling  Arrie Senate MD

## 2018-02-02 LAB — THYROID PANEL WITH TSH
Free Thyroxine Index: 2.3 (ref 1.2–4.9)
T3 Uptake Ratio: 31 % (ref 24–39)
T4, Total: 7.4 ug/dL (ref 4.5–12.0)
TSH: 1.52 u[IU]/mL (ref 0.450–4.500)

## 2018-02-05 ENCOUNTER — Ambulatory Visit (INDEPENDENT_AMBULATORY_CARE_PROVIDER_SITE_OTHER): Payer: Medicare HMO

## 2018-02-05 ENCOUNTER — Other Ambulatory Visit: Payer: Self-pay | Admitting: Pediatrics

## 2018-02-05 ENCOUNTER — Encounter: Payer: Self-pay | Admitting: Pediatrics

## 2018-02-05 ENCOUNTER — Ambulatory Visit (INDEPENDENT_AMBULATORY_CARE_PROVIDER_SITE_OTHER): Payer: Medicare HMO | Admitting: Pediatrics

## 2018-02-05 VITALS — BP 137/84 | HR 88 | Temp 96.7°F | Ht 74.0 in

## 2018-02-05 DIAGNOSIS — M25572 Pain in left ankle and joints of left foot: Secondary | ICD-10-CM

## 2018-02-05 DIAGNOSIS — S92002A Unspecified fracture of left calcaneus, initial encounter for closed fracture: Secondary | ICD-10-CM | POA: Diagnosis not present

## 2018-02-05 DIAGNOSIS — W19XXXA Unspecified fall, initial encounter: Secondary | ICD-10-CM

## 2018-02-05 DIAGNOSIS — S99922A Unspecified injury of left foot, initial encounter: Secondary | ICD-10-CM | POA: Diagnosis not present

## 2018-02-05 DIAGNOSIS — M79672 Pain in left foot: Secondary | ICD-10-CM | POA: Diagnosis not present

## 2018-02-05 NOTE — Progress Notes (Signed)
  Subjective:   Patient ID: Danny [name]., male    DOB: 24-Aug-1931, 85 y.o.   MRN: [id number] CC: Foot Pain (left)  HPI: Danny [name]. is a 85 y.o. male   This morning about 11:30 AM he was on stepstool in the garage, getting something off a shelf, lost his balance falling to the floor.  He does not remember hitting his ankle.  He fell mostly on his thighs, back, shoulders.  The heel hurts when he puts pressure on it.  Otherwise he has no pain in the foot, heel, ankles hips knees shoulders.   Relevant past medical, surgical, family and social history reviewed. Allergies and medications reviewed and updated. Social History   Tobacco Use  Smoking Status Former Smoker  . Types: Cigarettes  . Start date: 01/24/1950  . Last attempt to quit: 01/24/1961  . Years since quitting: 57.0  Smokeless Tobacco Never Used   ROS: Per HPI   Objective:    BP 137/84   Pulse 88   Temp (!) 96.7 F (35.9 C) (Oral)   Ht 6\' 2"  (1.88 m)   BMI 22.21 kg/m   Wt Readings from Last 3 Encounters:  02/01/18 173 lb (78.5 kg)  09/01/17 171 lb (77.6 kg)  04/19/17 165 lb (74.8 kg)    Gen: NAD, alert, cooperative with exam, NCAT EYES: EOMI, no conjunctival injection, or no icterus CV:  distal pulses 2+ b/l Resp: normal WOB Ext: No edema, warm Neuro: Alert and oriented MSK: ttp with heel squeeze.  Ankle slightly swollen.  No bruising  Assessment & Plan:  [name] was seen today for foot pain.  Diagnoses and all orders for this visit:  Closed displaced fracture of left calcaneus, unspecified portion of calcaneus, initial encounter Closed fracture on x-ray.  No weightbearing.  Has appointment tomorrow with orthopedics at 1.  Patient says he has crutches, walker, wheelchair at home.  He will be able to maintain no weightbearing until office visit with orthopedics.  Acute left ankle pain -     DG Ankle Complete Left; Future   Follow up plan: As needed Assunta Found, MD [place]

## 2018-02-06 DIAGNOSIS — W19XXXA Unspecified fall, initial encounter: Secondary | ICD-10-CM | POA: Diagnosis not present

## 2018-02-06 DIAGNOSIS — S92002A Unspecified fracture of left calcaneus, initial encounter for closed fracture: Secondary | ICD-10-CM | POA: Diagnosis not present

## 2018-02-06 DIAGNOSIS — S92002D Unspecified fracture of left calcaneus, subsequent encounter for fracture with routine healing: Secondary | ICD-10-CM | POA: Insufficient documentation

## 2018-02-07 ENCOUNTER — Other Ambulatory Visit: Payer: Self-pay | Admitting: Family Medicine

## 2018-02-08 NOTE — Progress Notes (Signed)
Patient ID: Danny [name]., male   DOB: 08/10/1931, 85 y.o.   MRN: [id number]     86 y.o. f/u PVD, CAD/CABG PAF and GERD.  CABG was in 2003 and had left CEA with right subclavian bypass in 2009. Dr Scot Dock follows his vascular disease and he has had known elevated velocities in the distal limb of his right graft but no intervention being considered Has not had right arm weakness April 2018 had PAF started on eliquis but cost prohibitive and changed to coumadin [facility] April 2018  CRF;s include HTN and HLD Compliant with meds   [name] working in garage and fractured his left heel Currently has it wrapped in a splint    ROS: Denies fever, malais, weight loss, blurry vision, decreased visual acuity, cough, sputum, SOB, hemoptysis, pleuritic pain, palpitaitons, heartburn, abdominal pain, melena, lower extremity edema, claudication, or rash.  All other systems reviewed and negative  General:  BP 947 systolic  On left and 096 systolic on right  BP 283/66 (BP Location: Right Arm)   Pulse 76   Ht 6\' 2"  (1.88 m)   Wt 173 lb (78.5 kg)   SpO2 96%   BMI 22.21 kg/m  Affect appropriate Healthy:  appears stated age 32: normal Neck supple with no adenopathy JVP normal bilateral  bruits no thyromegaly Lungs clear with no wheezing and good diaphragmatic motion Heart:  S1/S2 no murmur, no rub, gallop or click PMI normal post sternotimy  Abdomen: benighn, BS positve, no tenderness, no AAA no bruit.  No HSM or HJR Left carotid and subclavian bruit with pulse deficit in Left arm  No edema Neuro non-focal Skin warm and dry Left foot in splint with some swelling     Current Outpatient Medications  Medication Sig Dispense Refill  . ALPRAZolam (XANAX) 0.25 MG tablet Take 1 tablet (0.25 mg total) by mouth at bedtime as needed. 30 tablet 5  . Cholecalciferol (VITAMIN D-3 PO) Take 2,000 mg by mouth every morning.     [name] losartan (COZAAR) 50 MG tablet TAKE 1 TABLET EVERY MORNING 90 tablet 1  .  lovastatin (MEVACOR) 40 MG tablet TAKE 1 TABLET AT BEDTIME 90 tablet 1  . metoprolol tartrate (LOPRESSOR) 25 MG tablet TAKE 1/2 TABLET TWICE DAILY 90 tablet 1  . Multiple Vitamin (MULTIVITAMIN) tablet Take 1 tablet by mouth every morning.     . nitroGLYCERIN (NITROSTAT) 0.4 MG SL tablet PLACE 1 TABLET (0.4 MG TOTAL) UNDER THE TONGUE EVERY 5 (FIVE) MINUTES AS NEEDED FOR CHEST PAIN. 25 tablet 1  . Omega-3 Fatty Acids (FISH OIL) 1000 MG CAPS Take 2 capsules by mouth every morning.     [name] omeprazole (PRILOSEC) 20 MG capsule TAKE 1 CAPSULE TWICE DAILY 30 MINUTES PRIOR TO BREAKFAST AND SUPPER 180 capsule 3  . Probiotic Product (ALIGN) 4 MG CAPS Take 4 mg by mouth daily as needed.     . warfarin (COUMADIN) 5 MG tablet TAKE 1 TO 1 AND 1/2  TABLETS DAILY AS DIRECTED 135 tablet 1   No current facility-administered medications for this visit.     Allergies  Doxycycline; Contrast media [iodinated diagnostic agents]; Lunesta [eszopiclone]; and Morphine and related  Electrocardiogram:   11/30/12  SR rate 60 LAE otherwise normal  05/15/14  SR ate 47  Normal ECG   Assessment and Plan CAD:  Distant CABG stable no chest pain    Bradycardia.:  Improved with lower dose beta blocker   PVD:  Stenosis in carotid limb of  right bypass and VB steal f/u duplex 6 months at [facility] office Not likely  Any good surgical options at his age symptoms stable   HTN:  Well controlled should follow BP in left arm which is higher   GI:  Improved with  diagnosis of gastritis and diverticulitis  Omeprazole bid   PAF:  Post [place] on 05/05/16  Now transitioned to coumadin due to cost  INR followed at Dr Charma Igo office  Ortho:  Continue walker and splinting f/u with ortho for xrays after boot comes off to document healing   [name]

## 2018-02-12 ENCOUNTER — Encounter: Payer: Self-pay | Admitting: Cardiovascular Disease

## 2018-02-12 ENCOUNTER — Ambulatory Visit: Payer: Medicare HMO | Admitting: Cardiovascular Disease

## 2018-02-12 VITALS — BP 110/74 | HR 76 | Ht 74.0 in | Wt 173.0 lb

## 2018-02-12 DIAGNOSIS — I10 Essential (primary) hypertension: Secondary | ICD-10-CM | POA: Diagnosis not present

## 2018-02-12 NOTE — Patient Instructions (Signed)
Medication Instructions:  Your physician recommends that you continue on your current medications as directed. Please refer to the Current Medication list given to you today.  If you need a refill on your cardiac medications before your next appointment, please call your pharmacy.   Lab work: NONE   If you have labs (blood work) drawn today and your tests are completely normal, you will receive your results only by: . MyChart Message (if you have MyChart) OR . A paper copy in the mail If you have any lab test that is abnormal or we need to change your treatment, we will call you to review the results.  Testing/Procedures: NONE   Follow-Up: At [facility], you and your health needs are our priority.  As part of our continuing mission to provide you with exceptional heart care, we have created designated Provider Care Teams.  These Care Teams include your primary Cardiologist (physician) and Advanced Practice Providers (APPs -  Physician Assistants and Nurse Practitioners) who all work together to provide you with the care you need, when you need it. You will need a follow up appointment in 6 months.  Please call our office 2 months in advance to schedule this appointment.  You may see No primary care provider on file. or one of the following Advanced Practice Providers on your designated Care Team:   Brittany Strader, PA-C ([place] Office) . Michele Lenze, PA-C ([place] Office)  Any Other Special Instructions Will Be Listed Below (If Applicable). Thank you for choosing [facility]!     

## 2018-02-14 ENCOUNTER — Ambulatory Visit: Payer: Medicare HMO | Admitting: Pharmacist Clinician (PhC)/ Clinical Pharmacy Specialist

## 2018-02-14 DIAGNOSIS — I482 Chronic atrial fibrillation, unspecified: Secondary | ICD-10-CM

## 2018-02-14 DIAGNOSIS — Z7901 Long term (current) use of anticoagulants: Secondary | ICD-10-CM

## 2018-02-14 DIAGNOSIS — I4891 Unspecified atrial fibrillation: Secondary | ICD-10-CM

## 2018-02-14 LAB — COAGUCHEK XS/INR WAIVED
INR: 2.3 — ABNORMAL HIGH (ref 0.9–1.1)
Prothrombin Time: 27.1 s

## 2018-02-14 NOTE — Patient Instructions (Signed)
Description   Continue taking 1.5 tabs on mon, wed, and fri and 1.0 tab all other days   INR is 2.3  (goal is 2-3)  Perfect reading today

## 2018-02-27 DIAGNOSIS — S92002D Unspecified fracture of left calcaneus, subsequent encounter for fracture with routine healing: Secondary | ICD-10-CM | POA: Diagnosis not present

## 2018-03-19 DIAGNOSIS — H353131 Nonexudative age-related macular degeneration, bilateral, early dry stage: Secondary | ICD-10-CM | POA: Diagnosis not present

## 2018-03-19 DIAGNOSIS — H3561 Retinal hemorrhage, right eye: Secondary | ICD-10-CM | POA: Diagnosis not present

## 2018-03-20 DIAGNOSIS — S92002D Unspecified fracture of left calcaneus, subsequent encounter for fracture with routine healing: Secondary | ICD-10-CM | POA: Diagnosis not present

## 2018-03-21 ENCOUNTER — Ambulatory Visit: Payer: Medicare HMO | Admitting: Pharmacist Clinician (PhC)/ Clinical Pharmacy Specialist

## 2018-03-21 ENCOUNTER — Encounter: Payer: Self-pay | Admitting: Pharmacist Clinician (PhC)/ Clinical Pharmacy Specialist

## 2018-03-21 DIAGNOSIS — Z7901 Long term (current) use of anticoagulants: Secondary | ICD-10-CM

## 2018-03-21 DIAGNOSIS — I482 Chronic atrial fibrillation, unspecified: Secondary | ICD-10-CM

## 2018-03-21 DIAGNOSIS — I4891 Unspecified atrial fibrillation: Secondary | ICD-10-CM | POA: Diagnosis not present

## 2018-03-21 LAB — COAGUCHEK XS/INR WAIVED
INR: 2.4 — ABNORMAL HIGH (ref 0.9–1.1)
Prothrombin Time: 29.2 s

## 2018-03-21 NOTE — Patient Instructions (Signed)
Description   Continue taking 1 1/2 tablets Mondays and Fridays and 1 tablet all other days of the week  INR is 2.4  (goal is 2-3)  Perfect reading today

## 2018-03-29 ENCOUNTER — Other Ambulatory Visit: Payer: Self-pay | Admitting: Family Medicine

## 2018-03-29 DIAGNOSIS — H35033 Hypertensive retinopathy, bilateral: Secondary | ICD-10-CM | POA: Diagnosis not present

## 2018-03-29 DIAGNOSIS — H353113 Nonexudative age-related macular degeneration, right eye, advanced atrophic without subfoveal involvement: Secondary | ICD-10-CM | POA: Diagnosis not present

## 2018-03-29 DIAGNOSIS — H3561 Retinal hemorrhage, right eye: Secondary | ICD-10-CM | POA: Diagnosis not present

## 2018-03-29 DIAGNOSIS — H353122 Nonexudative age-related macular degeneration, left eye, intermediate dry stage: Secondary | ICD-10-CM | POA: Diagnosis not present

## 2018-03-30 ENCOUNTER — Other Ambulatory Visit: Payer: Self-pay | Admitting: *Deleted

## 2018-03-30 MED ORDER — ALPRAZOLAM 0.25 MG PO TABS: 0.2500 mg | ORAL_TABLET | Freq: Every evening | ORAL | 4 refills | Status: DC | PRN

## 2018-04-16 DIAGNOSIS — C4442 Squamous cell carcinoma of skin of scalp and neck: Secondary | ICD-10-CM | POA: Diagnosis not present

## 2018-04-16 DIAGNOSIS — X32XXXD Exposure to sunlight, subsequent encounter: Secondary | ICD-10-CM | POA: Diagnosis not present

## 2018-04-16 DIAGNOSIS — L570 Actinic keratosis: Secondary | ICD-10-CM | POA: Diagnosis not present

## 2018-05-09 ENCOUNTER — Ambulatory Visit (INDEPENDENT_AMBULATORY_CARE_PROVIDER_SITE_OTHER): Payer: Medicare HMO | Admitting: Pharmacist Clinician (PhC)/ Clinical Pharmacy Specialist

## 2018-05-09 ENCOUNTER — Other Ambulatory Visit: Payer: Self-pay

## 2018-05-09 ENCOUNTER — Encounter: Payer: Self-pay | Admitting: Pharmacist Clinician (PhC)/ Clinical Pharmacy Specialist

## 2018-05-09 DIAGNOSIS — Z7901 Long term (current) use of anticoagulants: Secondary | ICD-10-CM

## 2018-05-09 DIAGNOSIS — I482 Chronic atrial fibrillation, unspecified: Secondary | ICD-10-CM

## 2018-05-09 LAB — COAGUCHEK XS/INR WAIVED
INR: 4.3 — ABNORMAL HIGH (ref 0.9–1.1)
Prothrombin Time: 52.2 s

## 2018-05-09 NOTE — Patient Instructions (Addendum)
  Description   No warfarin today and tomorrow.  Then Friday resume regular schedule.  INR is 4.3  (goal is 2-3)  Too thin today

## 2018-05-22 ENCOUNTER — Other Ambulatory Visit: Payer: Self-pay

## 2018-05-23 ENCOUNTER — Ambulatory Visit: Payer: Medicare HMO | Admitting: Pharmacist Clinician (PhC)/ Clinical Pharmacy Specialist

## 2018-05-23 DIAGNOSIS — Z7901 Long term (current) use of anticoagulants: Secondary | ICD-10-CM | POA: Diagnosis not present

## 2018-05-23 DIAGNOSIS — I482 Chronic atrial fibrillation, unspecified: Secondary | ICD-10-CM | POA: Diagnosis not present

## 2018-05-23 LAB — COAGUCHEK XS/INR WAIVED
INR: 3.8 — ABNORMAL HIGH (ref 0.9–1.1)
Prothrombin Time: 45.8 s

## 2018-05-23 NOTE — Patient Instructions (Signed)
Description   No warfarin today, then change to 1 tablet every day except Wednesdays take 1/2 tablet.  INR today 3.8 (goal is 2-3)  Slightly thin

## 2018-05-24 DIAGNOSIS — Z08 Encounter for follow-up examination after completed treatment for malignant neoplasm: Secondary | ICD-10-CM | POA: Diagnosis not present

## 2018-05-24 DIAGNOSIS — L570 Actinic keratosis: Secondary | ICD-10-CM | POA: Diagnosis not present

## 2018-05-24 DIAGNOSIS — X32XXXD Exposure to sunlight, subsequent encounter: Secondary | ICD-10-CM | POA: Diagnosis not present

## 2018-05-24 DIAGNOSIS — Z85828 Personal history of other malignant neoplasm of skin: Secondary | ICD-10-CM | POA: Diagnosis not present

## 2018-05-28 ENCOUNTER — Other Ambulatory Visit: Payer: Self-pay | Admitting: Cardiovascular Disease

## 2018-05-28 DIAGNOSIS — I6523 Occlusion and stenosis of bilateral carotid arteries: Secondary | ICD-10-CM

## 2018-05-28 DIAGNOSIS — G458 Other transient cerebral ischemic attacks and related syndromes: Secondary | ICD-10-CM

## 2018-05-28 IMAGING — DX DG CHEST 2V
2 series · 2 of 2 positions shown · non-contrast
Comparison: Chest x-ray of January 14, 2014

CLINICAL DATA: Atrial fibrillation, CABG, thoracic aortic
atherosclerosis, hypertension.

EXAM:
CHEST  2 VIEW

[chest pa]
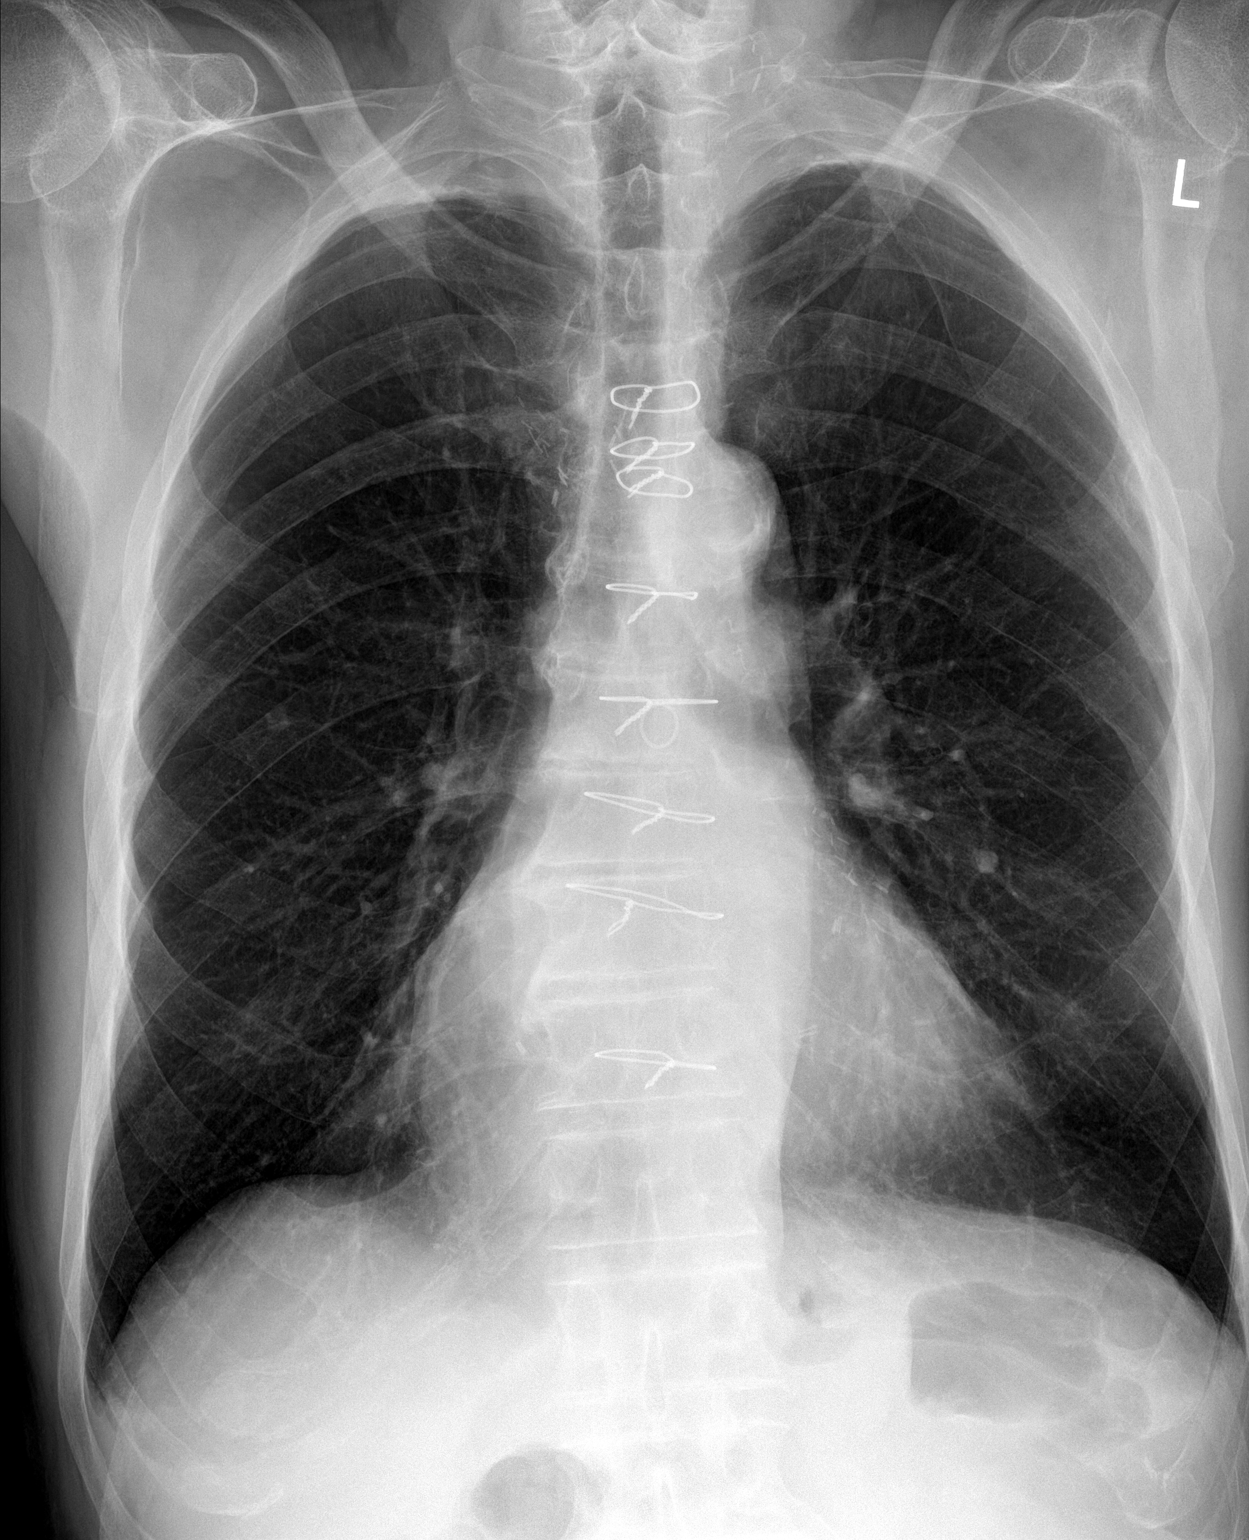

[chest lat]
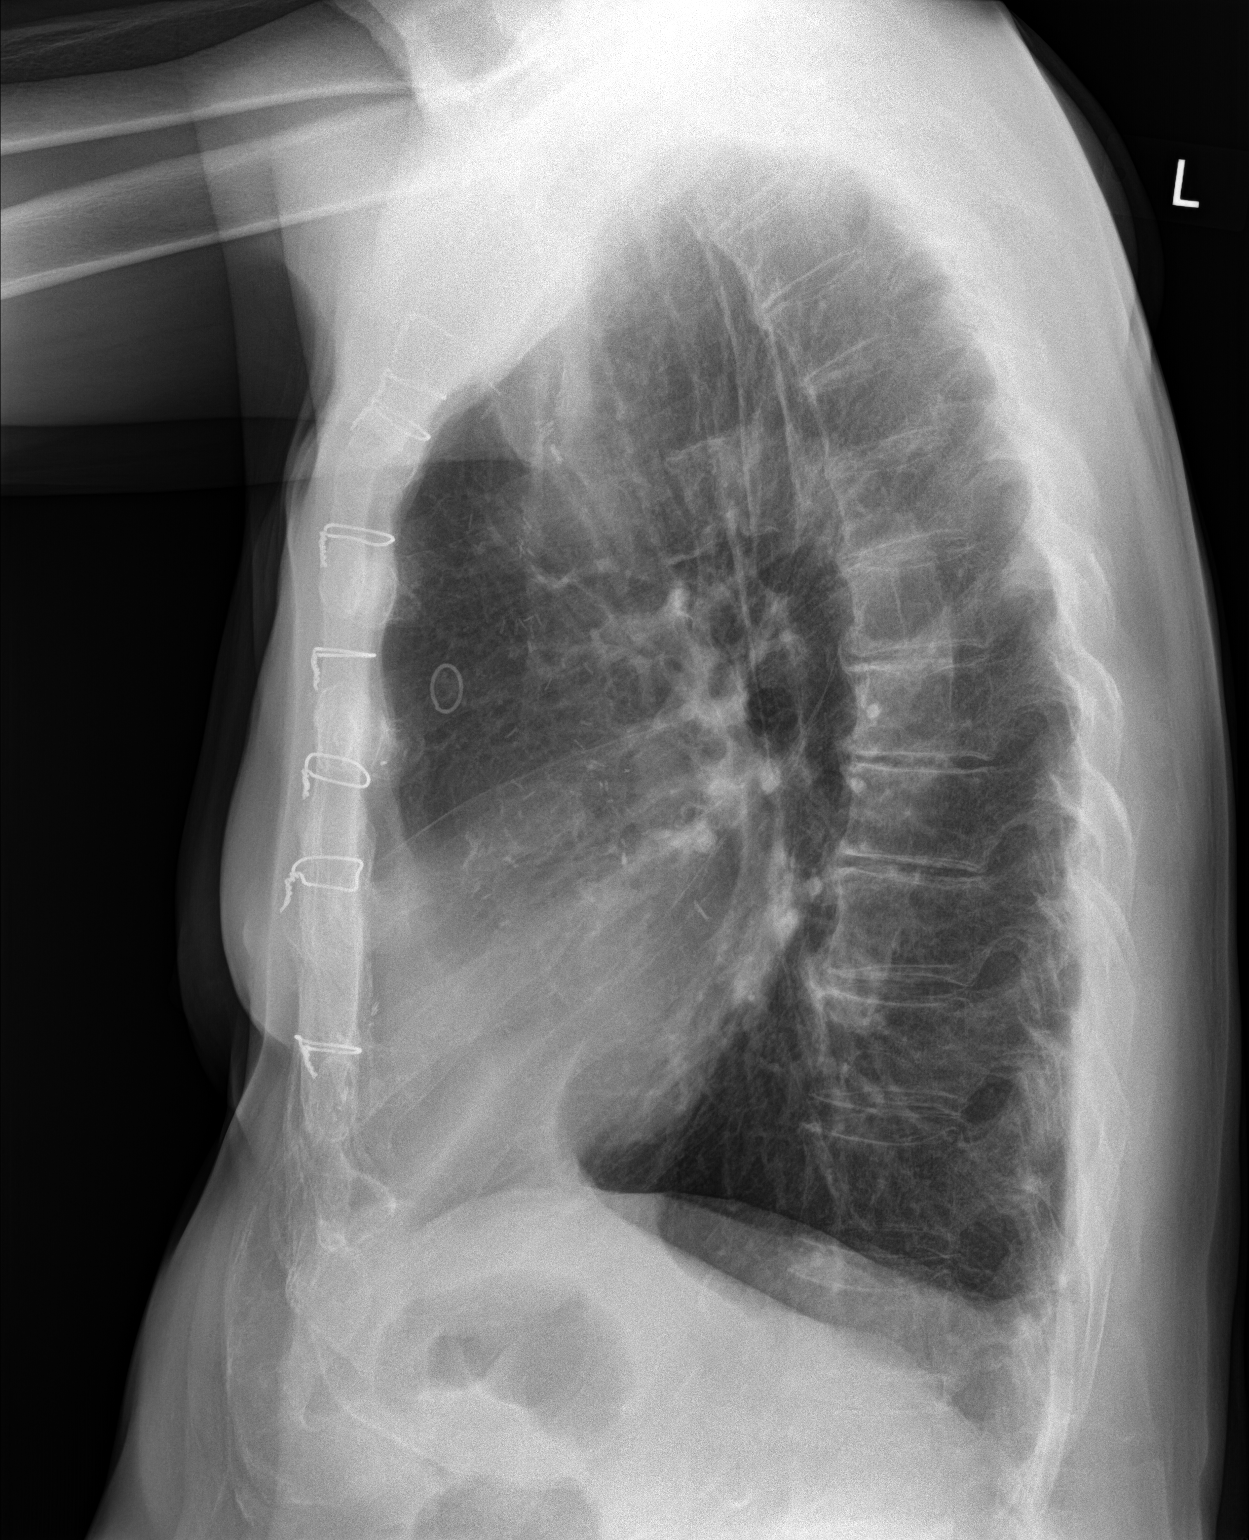

[2 of 2 positions shown; findings below may reference images not displayed]

FINDINGS: The lungs are hyperinflated with hemidiaphragm flattening. There is
no focal infiltrate. There is no pleural effusion. There is stable
biapical pleural thickening. The heart and pulmonary vascularity are
normal. There is calcification in the wall of the aortic arch. The
sternal wires from previous CABG are intact. The bony thorax
exhibits no acute abnormality.
IMPRESSION: COPD. No pneumonia nor CHF nor other acute cardiopulmonary
abnormality.

Thoracic aortic atherosclerosis.

## 2018-05-30 ENCOUNTER — Other Ambulatory Visit: Payer: Self-pay

## 2018-05-30 ENCOUNTER — Other Ambulatory Visit: Payer: Medicare HMO

## 2018-05-30 DIAGNOSIS — I10 Essential (primary) hypertension: Secondary | ICD-10-CM | POA: Diagnosis not present

## 2018-05-30 DIAGNOSIS — E7800 Pure hypercholesterolemia, unspecified: Secondary | ICD-10-CM

## 2018-05-30 DIAGNOSIS — E559 Vitamin D deficiency, unspecified: Secondary | ICD-10-CM

## 2018-05-30 DIAGNOSIS — N400 Enlarged prostate without lower urinary tract symptoms: Secondary | ICD-10-CM | POA: Diagnosis not present

## 2018-05-30 DIAGNOSIS — I700 Atherosclerosis of aorta: Secondary | ICD-10-CM | POA: Diagnosis not present

## 2018-05-30 NOTE — Addendum Note (Signed)
Addended by: Earlene Plater on: 05/30/2018 12:08 PM   Modules accepted: Orders

## 2018-05-31 LAB — CBC WITH DIFFERENTIAL/PLATELET
Basophils Absolute: 0.1 10*3/uL (ref 0.0–0.2)
Basos: 1 %
EOS (ABSOLUTE): 0.2 10*3/uL (ref 0.0–0.4)
Eos: 3 %
Hematocrit: 43.7 % (ref 37.5–51.0)
Hemoglobin: 15.1 g/dL (ref 13.0–17.7)
Immature Grans (Abs): 0 10*3/uL (ref 0.0–0.1)
Immature Granulocytes: 0 %
Lymphocytes Absolute: 1.7 10*3/uL (ref 0.7–3.1)
Lymphs: 22 %
MCH: 32.1 pg (ref 26.6–33.0)
MCHC: 34.6 g/dL (ref 31.5–35.7)
MCV: 93 fL (ref 79–97)
Monocytes Absolute: 0.8 10*3/uL (ref 0.1–0.9)
Monocytes: 10 %
Neutrophils Absolute: 4.8 10*3/uL (ref 1.4–7.0)
Neutrophils: 64 %
Platelets: 270 10*3/uL (ref 150–450)
RBC: 4.71 x10E6/uL (ref 4.14–5.80)
RDW: 12.7 % (ref 11.6–15.4)
WBC: 7.5 10*3/uL (ref 3.4–10.8)

## 2018-05-31 LAB — HEPATIC FUNCTION PANEL
ALT: 17 IU/L (ref 0–44)
AST: 21 IU/L (ref 0–40)
Albumin: 4.6 g/dL (ref 3.6–4.6)
Alkaline Phosphatase: 95 IU/L (ref 39–117)
Bilirubin Total: 0.7 mg/dL (ref 0.0–1.2)
Bilirubin, Direct: 0.21 mg/dL (ref 0.00–0.40)
Total Protein: 6.8 g/dL (ref 6.0–8.5)

## 2018-05-31 LAB — BMP8+EGFR
BUN/Creatinine Ratio: 11 (ref 10–24)
BUN: 10 mg/dL (ref 8–27)
CO2: 24 mmol/L (ref 20–29)
Calcium: 9.5 mg/dL (ref 8.6–10.2)
Chloride: 98 mmol/L (ref 96–106)
Creatinine, Ser: 0.88 mg/dL (ref 0.76–1.27)
GFR calc Af Amer: 91 mL/min/{1.73_m2} (ref >59)
GFR calc non Af Amer: 78 mL/min/{1.73_m2} (ref >59)
Glucose: 100 mg/dL — ABNORMAL HIGH (ref 65–99)
Potassium: 4.3 mmol/L (ref 3.5–5.2)
Sodium: 138 mmol/L (ref 134–144)

## 2018-05-31 LAB — LIPID PANEL
Chol/HDL Ratio: 2.6 ratio (ref 0.0–5.0)
Cholesterol, Total: 178 mg/dL (ref 100–199)
HDL: 68 mg/dL (ref >39)
LDL Calculated: 96 mg/dL (ref 0–99)
Triglycerides: 71 mg/dL (ref 0–149)
VLDL Cholesterol Cal: 14 mg/dL (ref 5–40)

## 2018-05-31 LAB — VITAMIN D 25 HYDROXY (VIT D DEFICIENCY, FRACTURES): Vit D, 25-Hydroxy: 87.9 ng/mL (ref 30.0–100.0)

## 2018-06-05 ENCOUNTER — Other Ambulatory Visit: Payer: Self-pay

## 2018-06-06 ENCOUNTER — Other Ambulatory Visit: Payer: Self-pay

## 2018-06-06 ENCOUNTER — Ambulatory Visit (INDEPENDENT_AMBULATORY_CARE_PROVIDER_SITE_OTHER): Payer: Medicare HMO | Admitting: Pharmacist Clinician (PhC)/ Clinical Pharmacy Specialist

## 2018-06-06 ENCOUNTER — Encounter: Payer: Self-pay | Admitting: Pharmacist Clinician (PhC)/ Clinical Pharmacy Specialist

## 2018-06-06 ENCOUNTER — Encounter: Payer: Self-pay | Admitting: Family Medicine

## 2018-06-06 ENCOUNTER — Ambulatory Visit (INDEPENDENT_AMBULATORY_CARE_PROVIDER_SITE_OTHER): Payer: Medicare HMO | Admitting: Family Medicine

## 2018-06-06 DIAGNOSIS — I482 Chronic atrial fibrillation, unspecified: Secondary | ICD-10-CM | POA: Diagnosis not present

## 2018-06-06 DIAGNOSIS — E7800 Pure hypercholesterolemia, unspecified: Secondary | ICD-10-CM | POA: Diagnosis not present

## 2018-06-06 DIAGNOSIS — C44629 Squamous cell carcinoma of skin of left upper limb, including shoulder: Secondary | ICD-10-CM

## 2018-06-06 DIAGNOSIS — N400 Enlarged prostate without lower urinary tract symptoms: Secondary | ICD-10-CM | POA: Diagnosis not present

## 2018-06-06 DIAGNOSIS — I739 Peripheral vascular disease, unspecified: Secondary | ICD-10-CM

## 2018-06-06 DIAGNOSIS — I700 Atherosclerosis of aorta: Secondary | ICD-10-CM | POA: Diagnosis not present

## 2018-06-06 DIAGNOSIS — R0989 Other specified symptoms and signs involving the circulatory and respiratory systems: Secondary | ICD-10-CM

## 2018-06-06 DIAGNOSIS — D75839 Thrombocytosis, unspecified: Secondary | ICD-10-CM

## 2018-06-06 DIAGNOSIS — I10 Essential (primary) hypertension: Secondary | ICD-10-CM

## 2018-06-06 DIAGNOSIS — I480 Paroxysmal atrial fibrillation: Secondary | ICD-10-CM

## 2018-06-06 DIAGNOSIS — Z7901 Long term (current) use of anticoagulants: Secondary | ICD-10-CM | POA: Diagnosis not present

## 2018-06-06 DIAGNOSIS — I714 Abdominal aortic aneurysm, without rupture, unspecified: Secondary | ICD-10-CM

## 2018-06-06 DIAGNOSIS — E559 Vitamin D deficiency, unspecified: Secondary | ICD-10-CM | POA: Diagnosis not present

## 2018-06-06 DIAGNOSIS — D473 Essential (hemorrhagic) thrombocythemia: Secondary | ICD-10-CM

## 2018-06-06 LAB — COAGUCHEK XS/INR WAIVED
INR: 2.9 — ABNORMAL HIGH (ref 0.9–1.1)
Prothrombin Time: 35.2 s

## 2018-06-06 MED ORDER — NITROGLYCERIN 0.4 MG SL SUBL: SUBLINGUAL_TABLET | SUBLINGUAL | 3 refills | Status: DC

## 2018-06-06 NOTE — Addendum Note (Signed)
Addended by: Liliane Bade on: 06/06/2018 10:04 AM   Modules accepted: Orders

## 2018-06-06 NOTE — Patient Instructions (Signed)
Description   Continue taking 1 tablet every day except Wednesdays take 1/2 tablet.  INR today 2.9 (goal is 2-3)  Perfect Reading

## 2018-06-06 NOTE — Addendum Note (Signed)
Addended by: Zannie Cove on: 06/06/2018 02:29 PM   Modules accepted: Orders

## 2018-06-06 NOTE — Progress Notes (Signed)
Virtual Visit Via telephone Note I connected with@ on 06/06/18 by telephone and verified that I am speaking with the correct person or authorized healthcare agent using two identifiers. Danny [name]. is currently located at home and there are no unauthorized people in close proximity. I completed this visit while in a private location in my home .  This visit type was conducted due to national recommendations for restrictions regarding the COVID-19 Pandemic (e.g. social distancing).  This format is felt to be most appropriate for this patient at this time.  All issues noted in this document were discussed and addressed.  No physical exam was performed.    I discussed the limitations, risks, security and privacy concerns of performing an evaluation and management service by telephone and the availability of in person appointments. I also discussed with the patient that there may be a patient responsible charge related to this service. The patient expressed understanding and agreed to proceed.   Date:  06/06/2018    ID:  Danny [name].      22-Nov-1931        [id number]   Patient Care Team Patient Care Team: Danny Herb, MD as PCP - General (Family Medicine) Danny Hector, MD as Attending Physician (Cardiology) Danny Binder, MD as Consulting Physician (Gastroenterology) Okey Regal, OD (Optometry) Danny Alberts, MD as Consulting Physician (Cardiothoracic Surgery)  Reason for Visit: Primary Care Follow-up     History of Present Illness & Review of Systems:     Danny [name]. is a 85 y.o. year old male primary care patient that presents today for a telehealth visit.  The patient is doing well overall and is recovering from the left calcaneal fracture.  He is walking more and this is feeling better though still hurting some.  His lack of activity probably explains why his LDL-C is up higher.  Generally in the past when he has an elevated LDL he starts exercising  more and can usually get this down below 70 with increased exercise and continued monitoring of his diet.  He does have upcoming appointments with his cardiologist in June.  He also has been seeing Dr. [place] [name] has a recheck by him in about 6 months for recurring skin cancers.  He sees a new doctor, Dr. Derrel [name] in [place] for macular degeneration.  He is in need of getting his nitroglycerin refilled.  Today he denies any chest pain pressure or tightness.  He does have slight shortness of breath with walking up hills in hopes to get this improved with increasing activity.  He currently denies any trouble with his stomach including swallowing heartburn indigestion nausea vomiting diarrhea or blood in the stool.  He has nocturia x1 but no urgency or frequency.  He understands that his LDL goal is less than 70 because of having past bypass surgery.  He does need refills on his nitroglycerin to [facility]  Review of systems as stated, otherwise negative.  The patient does not have symptoms concerning for COVID-19 infection (fever, chills, cough, or new shortness of breath).      Current Medications (Verified) Allergies as of 06/06/2018      Reactions   Doxycycline Diarrhea   Contrast Media [iodinated Diagnostic Agents]    Syncope   Lunesta [eszopiclone]    "felt WILD"   Morphine And Related Nausea And Vomiting      Medication List       Accurate as of  Jun 06, 2018  8:13 AM. If you have any questions, ask your nurse or doctor.        Align 4 MG Caps Take 4 mg by mouth daily as needed.   ALPRAZolam 0.25 MG tablet Commonly known as:  XANAX Take 1 tablet (0.25 mg total) by mouth at bedtime as needed.   Fish Oil 1000 MG Caps Take 2 capsules by mouth every morning.   losartan 50 MG tablet Commonly known as:  COZAAR TAKE 1 TABLET EVERY MORNING   lovastatin 40 MG tablet Commonly known as:  MEVACOR TAKE 1 TABLET AT BEDTIME   metoprolol tartrate 25 MG tablet Commonly known as:  LOPRESSOR TAKE  1/2 TABLET TWICE DAILY   multivitamin tablet Take 1 tablet by mouth every morning.   nitroGLYCERIN 0.4 MG SL tablet Commonly known as:  Nitrostat PLACE 1 TABLET (0.4 MG TOTAL) UNDER THE TONGUE EVERY 5 (FIVE) MINUTES AS NEEDED FOR CHEST PAIN.   omeprazole 20 MG capsule Commonly known as:  PRILOSEC TAKE 1 CAPSULE TWICE DAILY 30 MINUTES PRIOR TO BREAKFAST AND SUPPER   VITAMIN D-3 PO Take 2,000 mg by mouth every morning.   warfarin 5 MG tablet Commonly known as:  COUMADIN Take as directed by the anticoagulation clinic. If you are unsure how to take this medication, talk to your nurse or doctor. Original instructions:  TAKE 1 TO 1 AND 1/2  TABLETS DAILY AS DIRECTED           Allergies (Verified)    Doxycycline; Contrast media [iodinated diagnostic agents]; Lunesta [eszopiclone]; and Morphine and related  Past Medical History Past Medical History:  Diagnosis Date   Anxiety    Atrial fibrillation (HCC)    C. difficile colitis    GERD (gastroesophageal reflux disease)    Hyperlipidemia    Hypertension    Vitamin D deficiency      Past Surgical History:  Procedure Laterality Date   CARDIOVERSION N/A 05/05/2016   Procedure: CARDIOVERSION;  Surgeon: Danny Hector, MD;  Location: [facility];  Service: Cardiovascular;  Laterality: N/A;   CAROTID ENDARTERECTOMY Left 2003   CAROTID-SUBCLAVIAN BYPASS GRAFT Right    COLONOSCOPY  2005   hyperplastic polyp x 1 with no adenoma   COLONOSCOPY N/A 04/11/2014   JHE:RDEY diverticulosis in the sigmoid colon/left colon is redundant   CORONARY ARTERY BYPASS GRAFT     ESOPHAGEAL DILATION N/A 04/11/2014   Procedure: ESOPHAGEAL DILATION;  Surgeon: Danny Binder, MD;  [facility];  Service: Endoscopy;  Laterality: N/A;   ESOPHAGOGASTRODUODENOSCOPY N/A 04/11/2014   CXK:GYJEHUDJS at the gastro junction/moderate erosive/duodenal web   EYE SURGERY Bilateral    cataracts   SKIN CANCER EXCISION      Social History     Socioeconomic History   Marital status: Married    Spouse name: ruby   Number of children: 1   Years of education: Not on file   Highest education level: Not on file  Occupational History   Occupation: retired    Fish farm manager: [employer]  Social Designer, fashion/clothing strain: Not on file   Food insecurity:    Worry: Not on file    Inability: Not on file   Transportation needs:    Medical: Not on file    Non-medical: Not on file  Tobacco Use   Smoking status: Former Smoker    Types: Cigarettes    Start date: 01/24/1950    Last attempt to quit: 01/24/1961    Years since  quitting: 57.4   Smokeless tobacco: Never Used  Substance and Sexual Activity   Alcohol use: Yes    Alcohol/week: 0.0 standard drinks    Comment: not drank since 12/2009   Drug use: No   Sexual activity: Yes    Partners: Female  Lifestyle   Physical activity:    Days per week: Not on file    Minutes per session: Not on file   Stress: Not on file  Relationships   Social connections:    Talks on phone: Not on file    Gets together: Not on file    Attends religious service: Not on file    Active member of club or organization: Not on file    Attends meetings of clubs or organizations: Not on file    Relationship status: Not on file  Other Topics Concern   Not on file  Social History Narrative   Not on file     Family History  Problem Relation Age of Onset   Heart disease Mother    Stroke Mother    Stroke Father    Diabetes Son    Stroke Son    Other Sister        ruptured appendix    Cancer Brother        lung cancer / asbestos   Mental illness Sister    Kidney disease Sister    Colon cancer Neg Hx        "not that I know of."      Labs/Other Tests and Data Reviewed:    Wt Readings from Last 3 Encounters:  02/12/18 173 lb (78.5 kg)  02/01/18 173 lb (78.5 kg)  09/01/17 171 lb (77.6 kg)   Temp Readings from Last 3 Encounters:  02/05/18 (!) 96.7 F (35.9 C)  (Oral)  02/01/18 (!) 96.9 F (36.1 C) (Oral)  09/01/17 (!) 97 F (36.1 C) (Oral)   BP Readings from Last 3 Encounters:  02/12/18 110/74  02/05/18 137/84  02/01/18 120/69   Pulse Readings from Last 3 Encounters:  02/12/18 76  02/05/18 88  02/01/18 89     No results found for: HGBA1C Lab Results  Component Value Date   LDLCALC 96 05/30/2018   CREATININE 0.88 05/30/2018       Chemistry      Component Value Date/Time   NA 138 05/30/2018 1208   K 4.3 05/30/2018 1208   CL 98 05/30/2018 1208   CO2 24 05/30/2018 1208   BUN 10 05/30/2018 1208   CREATININE 0.88 05/30/2018 1208   CREATININE 0.85 05/25/2012 0832      Component Value Date/Time   CALCIUM 9.5 05/30/2018 1208   ALKPHOS 95 05/30/2018 1208   AST 21 05/30/2018 1208   ALT 17 05/30/2018 1208   BILITOT 0.7 05/30/2018 1208         OBSERVATIONS/ OBJECTIVE:     This patient is very methodical.  He did check his temperature this morning it was 97.4.  His blood pressure was 135/72.  His pulse was 87 he says it usually runs in the upper 80s or low 90s and he will discuss this with the cardiologist when he sees him in June.  He does not drink caffeine.  His weight was 165 and similar to what it has been in the past.  He has no edema and this is improved as his calcaneal fracture has improved.  He has no additional allergies and his medications were reviewed.  He has not had  his nitroglycerin refilled in a long time so we will do this to [facility] even though he has not used it in a good while.  He will get his pro time checked by the clinical pharmacist this morning.  He has had some trouble adjusting this because of a multivitamin that he was taking.  Physical exam deferred due to nature of telephonic visit.  ASSESSMENT & PLAN    Time:   Today, I have spent 26 minutes with the patient via telephone discussing the above including Covid precautions.     Visit Diagnoses: 1. Chronic atrial fibrillation -Continue to  follow-up with clinical pharmacy for pro time checks.  Continue to follow-up with Dr. Asa Lente and as planned in June  2. HYPERCHOLESTEROLEMIA  IIA -The patient's LDL-C was elevated at 96.  In the past he is always been able to manage this well with better diet habits and more exercise.  We will encourage him to do this especially since his fractured left calcaneus is improving.  We will not make any change in his medication treatment today.  3. Vitamin D deficiency -His vitamin D level recently was in the upper 80 range.  He is currently taking 4000 units daily Monday through Saturday.  We will have him decrease this and take 5000 units Monday through Friday.  4. Benign prostatic hyperplasia, unspecified whether lower urinary tract symptoms present -Patient does have nocturia x1.  5. Thoracic aorta atherosclerosis ([place]) -Continue aggressive therapeutic lifestyle changes with a goal to get the LDL-C less than 70.  6. Essential hypertension -Blood pressure is good but could be better for the systolic number and he can discuss this further with Dr. Alfonse Spruce when he sees him in June.  7. Peripheral vascular insufficiency (HCC) -Continue to walk and exercise regularly  8. Paroxysmal atrial fibrillation (HCC) -Continue to get protimes regularly and follow-up with cardiology as planned  9. AAA (abdominal aortic aneurysm) without rupture ([place]) -Follow-up with cardiology as planned  10. Thrombocytosis (HCC) -Last CBC did not have an increased platelet count.  11. Bilateral carotid bruits -Follow-up with cardiology as planned and get Dopplers as needed  12. 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 Hyperplasia of prostate -Patient has nocturia x1.  Patient Instructions  Continue current medicines and try to do better with diet and exercise if any way possible.  Your LDL-C or bad cholesterol goal was to be less than 70 and it was in the 90s  this time.  I know that you have had this heel fracture so hopefully you can begin to get out and get more physical activity with walking. Continue to practice good hand and respiratory hygiene Continue to drink plenty of water and fluids Always be careful and to not put yourself at risk for falling and avoid climbing Follow-up with cardiology as planned He should continue to monitor your aneurysm and the circulation in your neck      The above assessment and management plan was discussed with the patient. The patient verbalized understanding of and has agreed to the management plan. Patient is aware to call the clinic if symptoms persist or worsen. Patient is aware when to return to the clinic for a follow-up visit. Patient educated on when it is appropriate to go to the emergency department.    Danny Herb, MD [address], [place], [place] [address] Ph [phone]   Arrie Senate MD

## 2018-06-06 NOTE — Patient Instructions (Addendum)
Continue current medicines and try to do better with diet and exercise if any way possible.  Your LDL-C or bad cholesterol goal was to be less than 70 and it was in the 90s this time.  I know that you have had this heel fracture so hopefully you can begin to get out and get more physical activity with walking. Continue to practice good hand and respiratory hygiene Continue to drink plenty of water and fluids Always be careful and to not put yourself at risk for falling and avoid climbing Follow-up with cardiology as planned He should continue to monitor your aneurysm and the circulation in your neck

## 2018-06-18 ENCOUNTER — Other Ambulatory Visit: Payer: Self-pay | Admitting: Family Medicine

## 2018-07-17 ENCOUNTER — Other Ambulatory Visit: Payer: Self-pay

## 2018-07-18 ENCOUNTER — Encounter: Payer: Self-pay | Admitting: Pharmacist Clinician (PhC)/ Clinical Pharmacy Specialist

## 2018-07-18 ENCOUNTER — Ambulatory Visit (INDEPENDENT_AMBULATORY_CARE_PROVIDER_SITE_OTHER): Payer: Medicare HMO | Admitting: Pharmacist Clinician (PhC)/ Clinical Pharmacy Specialist

## 2018-07-18 DIAGNOSIS — Z7901 Long term (current) use of anticoagulants: Secondary | ICD-10-CM | POA: Diagnosis not present

## 2018-07-18 DIAGNOSIS — I482 Chronic atrial fibrillation, unspecified: Secondary | ICD-10-CM

## 2018-07-18 LAB — COAGUCHEK XS/INR WAIVED
INR: 2.4 — ABNORMAL HIGH (ref 0.9–1.1)
Prothrombin Time: 28.2 s

## 2018-07-18 NOTE — Patient Instructions (Signed)
Description   Continue taking 1 tablet every day except Wednesdays take 1/2 tablet.  INR today 2.4 (goal is 2-3)  [place]

## 2018-08-05 NOTE — Progress Notes (Signed)
Patient ID: Danny [name]., male   DOB: 24-Sep-1931, 85 y.o.   MRN: [id number]     85 y.o. f/u PVD, CAD/CABG PAF and GERD.  CABG was in 2003 and had left CEA with right subclavian bypass in 2009. 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 earlier in year still sore Has an eye doctor he sees for macular degeneration and skin doctor for cancer Last LDL was 96 in setting of inactivity   Last duplex July 2019 40-59% right ICA elevated velocities in distal limb of graft to subclavian Although BP 76 mmHg lower on right has biphasic distal pulses and no symptoms   Has palpitations sometimes when he wakes No angina was taking nitro for this Told him to take extra 12 of metoprolol instead as his afib is likely at rapid rate   ROS: Denies fever, malais, weight loss, blurry vision, decreased visual acuity, cough, sputum, SOB, hemoptysis, pleuritic pain, palpitaitons, heartburn, abdominal pain, melena, lower extremity edema, claudication, or rash.  All other systems reviewed and negative  General:  BP 644 systolic  On left and 034 systolic on right  BP 742/59 (BP Location: Left Arm)   Pulse 87   Temp 98.9 F (37.2 C)   Ht 6\' 2"  (1.88 m)   Wt 162 lb (73.5 kg)   BMI 20.80 kg/m  Affect appropriate Healthy:  appears stated age 26: normal Neck supple with no adenopathy JVP normal bilateral  bruits no thyromegaly Lungs clear with no wheezing and good diaphragmatic motion Heart:  S1/S2 no murmur, no rub, gallop or click PMI normal post sternotimy  Abdomen: benighn, BS positve, no tenderness, no AAA no bruit.  No HSM or HJR Left carotid and  subclavian bruit with pulse deficit in Left arm  No edema Neuro non-focal Skin warm and dry Left foot in splint with some swelling     Current Outpatient Medications  Medication Sig Dispense Refill  . ALPRAZolam (XANAX) 0.25 MG tablet Take 1 tablet (0.25 mg total) by mouth at bedtime as needed. 30 tablet 4  . Cholecalciferol (VITAMIN D-3 PO) Take 2,000 mg by mouth every morning.     [name] losartan (COZAAR) 50 MG tablet TAKE 1 TABLET EVERY MORNING 90 tablet 1  . lovastatin (MEVACOR) 40 MG tablet TAKE 1 TABLET AT BEDTIME 90 tablet 1  . metoprolol tartrate (LOPRESSOR) 25 MG tablet Take 1 tablet (25 mg total) by mouth 2 (two) times daily. 180 tablet 3  . Multiple Vitamin (MULTIVITAMIN) tablet Take 1 tablet by mouth every morning.     . nitroGLYCERIN (NITROSTAT) 0.4 MG SL tablet PLACE 1 TABLET (0.4 MG TOTAL) UNDER THE TONGUE EVERY 5 (FIVE) MINUTES AS NEEDED FOR CHEST PAIN. 25 tablet 3  . Omega-3 Fatty Acids (FISH OIL) 1000 MG CAPS Take 2 capsules by mouth every morning.     [name] omeprazole (PRILOSEC) 20 MG capsule TAKE 1 CAPSULE TWICE DAILY 30 MINUTES PRIOR TO BREAKFAST AND SUPPER 180 capsule 3  . Probiotic Product (ALIGN) 4 MG CAPS Take 4 mg by mouth daily as needed.     . warfarin (COUMADIN) 5 MG tablet TAKE 1  TO 1 AND 1/2 TABLETS DAILY AS DIRECTED 135 tablet 1   No current facility-administered medications for this visit.     Allergies  Doxycycline, Contrast media [iodinated diagnostic agents], Lunesta [eszopiclone], and Morphine and related  Electrocardiogram:   11/30/12  SR rate 60 LAE otherwise normal  05/15/14  SR ate 47  Normal ECG   Assessment and Plan CAD:  Distant CABG stable no chest pain    Bradycardia.:  Improved having tachy-palpitations in am can take extra 1/2 lopressor as needed   PVD:  Stenosis in carotid limb of right bypass and VB steal f/u duplex with UE dopplers ordered at [facility] office Not likely  Any good surgical options at his age symptoms stable   HTN:  Well  controlled should follow BP in left arm which is higher   GI:  Improved with  diagnosis of gastritis and diverticulitis  Omeprazole bid   PAF:  Post [place] on 05/05/16  Now transitioned to coumadin due to cost  INR followed at Dr Charma Igo office INR Rx 2.4 on 07/18/18   Ortho:  Post left calcaneal fracture f/u ortho ? Podiatry for inserts   HLD:  On statin labs with primary continue statin   [name]

## 2018-08-07 ENCOUNTER — Encounter: Payer: Self-pay | Admitting: Cardiovascular Disease

## 2018-08-07 ENCOUNTER — Ambulatory Visit: Payer: Medicare HMO | Admitting: Cardiovascular Disease

## 2018-08-07 ENCOUNTER — Other Ambulatory Visit: Payer: Self-pay

## 2018-08-07 VITALS — BP 128/72 | HR 87 | Temp 98.9°F | Ht 74.0 in | Wt 162.0 lb

## 2018-08-07 DIAGNOSIS — I2510 Atherosclerotic heart disease of native coronary artery without angina pectoris: Secondary | ICD-10-CM | POA: Diagnosis not present

## 2018-08-07 MED ORDER — METOPROLOL TARTRATE 25 MG PO TABS: 25.0000 mg | ORAL_TABLET | Freq: Two times a day (BID) | ORAL | 3 refills | Status: DC

## 2018-08-07 NOTE — Patient Instructions (Signed)
Medication Instructions:  Increase lopressor to 25 mg - two times daily   Labwork: none  Testing/Procedures: none  Follow-Up: Your physician wants you to follow-up in: 6 months.  You will receive a reminder letter in the mail two months in advance. If you don't receive a letter, please call our office to schedule the follow-up appointment.   Any Other Special Instructions Will Be Listed Below (If Applicable).     If you need a refill on your cardiac medications before your next appointment, please call your pharmacy.

## 2018-08-22 ENCOUNTER — Encounter: Payer: Self-pay | Admitting: Family Medicine

## 2018-08-23 ENCOUNTER — Telehealth: Payer: Self-pay | Admitting: Family Medicine

## 2018-08-23 NOTE — Chronic Care Management (AMB) (Signed)
Chronic Care Management   Note  08/23/2018 Name: Nisaiah Bechtol. MRN: [id number] DOB: 05-21-1931  [name]. is a 85 y.o. year old male who is a primary care patient of Dettinger, Fransisca Kaufmann, MD. I reached out to [name]. by phone today in response to a referral sent by Mr. CAVAN BEARDEN Jr.'s health plan.    Mr. Summerlin was given information about Chronic Care Management services today including:  1. CCM service includes personalized support from designated clinical staff supervised by his physician, including individualized plan of care and coordination with other care providers 2. 24/7 contact phone numbers for assistance for urgent and routine care needs. 3. Service will only be billed when office clinical staff spend 20 minutes or more in a month to coordinate care. 4. Only one practitioner may furnish and bill the service in a calendar month. 5. The patient may stop CCM services at any time (effective at the end of the month) by phone call to the office staff. 6. The patient will be responsible for cost sharing (co-pay) of up to 20% of the service fee (after annual deductible is met).  Patient agreed to services and verbal consent obtained.   Follow up plan: Telephone appointment with CCM team member scheduled for: 08/31/2018  [place]  [email or web address]   ??[phone]

## 2018-08-24 ENCOUNTER — Encounter: Payer: Self-pay | Admitting: Family Medicine

## 2018-08-27 ENCOUNTER — Encounter: Payer: Self-pay | Admitting: Family Medicine

## 2018-08-27 NOTE — Progress Notes (Addendum)
Assessment & Plan:  1-2. Long term (current) use of anticoagulants/ Paroxysmal atrial fibrillation ([place]) - Patient at INR goal of 2-3; INR today 2.6. Continue current Warfarin dose of 2.5 mg every Wednesday and 5 mg all other days of the week. Re-check INR in 4 weeks.  - CoaguChek XS/INR Waived  3. Difficulty sleeping - Discussed weaning off Xanax with patient. He is willing to try an alternative. Trazodone 50 mg PO QHS sent for him to try.  - traZODone (DESYREL) 50 MG tablet; Take 1 tablet (50 mg total) by mouth at bedtime as needed for sleep.  Dispense: 30 tablet; Refill: 2  4. Encounter to establish care - Patient does not wish to establish care with me today. He would like to remain a patient of Dr. Warrick Parisian but will follow-up with me for his INR and sleep in 4 weeks.    Return in about 4 weeks (around 09/26/2018) for INR & Sleep.  [name], MSN, APRN, FNP-C [facility]  Subjective:    Patient ID: Danny Hyneman., male    DOB: [birth year], 85 y.o.   MRN: [id number]  Patient Care Team: Dettinger, Fransisca Kaufmann, MD as PCP - General (Family Medicine) Josue Hector, MD as Attending Physician (Cardiology) [name], MD as Consulting Physician (Gastroenterology) Okey Regal, OD (Optometry) Rexene Alberts, MD as Consulting Physician (Cardiothoracic Surgery) Ilean China, RN as Registered Nurse   Chief Complaint:  recheck protime and Establish Care   HPI: Danny [name]. is a 85 y.o. male presenting on 08/29/2018 for recheck protime and Establish Care  Patient here for INR due to atrial fibrillation. INR goal 2-3. Last INR 2.4 on 07/18/2018. He was continued on Warfarin 2.5 mg every Wednesday and 5 mg all other days of the week.   Patient takes Xanax 0.25 mg QHS PRN for sleep. He has not tried any alternatives.    Relevant past medical, surgical, family and social history reviewed and updated as indicated. Interim medical history since our  last visit reviewed.  Allergies and medications reviewed and updated.  DATA REVIEWED: CHART IN EPIC  ROS: Negative unless specifically indicated above in HPI.    Current Outpatient Medications:  .  ALPRAZolam (XANAX) 0.25 MG tablet, Take 1 tablet (0.25 mg total) by mouth at bedtime as needed., Disp: 30 tablet, Rfl: 4 .  Cholecalciferol (VITAMIN D-3 PO), Take 2,000 mg by mouth every morning. , Disp: , Rfl:  .  losartan (COZAAR) 50 MG tablet, TAKE 1 TABLET EVERY MORNING, Disp: 90 tablet, Rfl: 1 .  lovastatin (MEVACOR) 40 MG tablet, TAKE 1 TABLET AT BEDTIME, Disp: 90 tablet, Rfl: 1 .  metoprolol tartrate (LOPRESSOR) 25 MG tablet, Take 1 tablet (25 mg total) by mouth 2 (two) times daily., Disp: 180 tablet, Rfl: 3 .  Multiple Vitamin (MULTIVITAMIN) tablet, Take 1 tablet by mouth every morning. , Disp: , Rfl:  .  nitroGLYCERIN (NITROSTAT) 0.4 MG SL tablet, PLACE 1 TABLET (0.4 MG TOTAL) UNDER THE TONGUE EVERY 5 (FIVE) MINUTES AS NEEDED FOR CHEST PAIN., Disp: 25 tablet, Rfl: 3 .  Omega-3 Fatty Acids (FISH OIL) 1000 MG CAPS, Take 2 capsules by mouth every morning. , Disp: , Rfl:  .  omeprazole (PRILOSEC) 20 MG capsule, TAKE 1 CAPSULE TWICE DAILY 30 MINUTES PRIOR TO BREAKFAST AND SUPPER, Disp: 180 capsule, Rfl: 3 .  Probiotic Product (ALIGN) 4 MG CAPS, Take 4 mg by mouth daily as needed. , Disp: , Rfl:  .  warfarin (COUMADIN) 5 MG tablet, TAKE 1 TO 1 AND 1/2 TABLETS DAILY AS DIRECTED, Disp: 135 tablet, Rfl: 1 .  traZODone (DESYREL) 50 MG tablet, Take 1 tablet (50 mg total) by mouth at bedtime as needed for sleep., Disp: 30 tablet, Rfl: 2  Allergies  Allergen Reactions  . Doxycycline Diarrhea  . Contrast Media [Iodinated Diagnostic Agents]     Syncope  . Lunesta [Eszopiclone]     "felt WILD"  . Morphine And Related Nausea And Vomiting   Past Medical History:  Diagnosis Date  . Anxiety   . Atrial fibrillation ([place])   . C. difficile colitis   . Enteritis due to Clostridium difficile, history  of 11/21/2012  . GERD (gastroesophageal reflux disease)   . Hyperlipidemia   . Hypertension   . Vitamin D deficiency     Past Surgical History:  Procedure Laterality Date  . CARDIOVERSION N/A 05/05/2016   Procedure: CARDIOVERSION;  Surgeon: Josue Hector, MD;  Location: [place];  Service: Cardiovascular;  Laterality: N/A;  . CAROTID ENDARTERECTOMY Left 2003  . CAROTID-SUBCLAVIAN BYPASS GRAFT Right   . COLONOSCOPY  2005   hyperplastic polyp x 1 with no adenoma  . COLONOSCOPY N/A 04/11/2014   [id number] diverticulosis in the sigmoid colon/left colon is redundant  . CORONARY ARTERY BYPASS GRAFT    . ESOPHAGEAL DILATION N/A 04/11/2014   Procedure: ESOPHAGEAL DILATION;  Surgeon: [name], MD;  [facility];  Service: Endoscopy;  Laterality: N/A;  . ESOPHAGOGASTRODUODENOSCOPY N/A 04/11/2014   EYC:[id number] at the gastro junction/moderate erosive/duodenal web  . EYE SURGERY Bilateral    cataracts  . SKIN CANCER EXCISION      Social History   Socioeconomic History  . Marital status: Married    Spouse name: ruby  . Number of children: 1  . Years of education: Not on file  . Highest education level: Not on file  Occupational History  . Occupation: retired    Fish farm manager: [employer]  Social Needs  . Financial resource strain: Not on file  . Food insecurity    Worry: Not on file    Inability: Not on file  . Transportation needs    Medical: Not on file    Non-medical: Not on file  Tobacco Use  . Smoking status: Former Smoker    Types: Cigarettes    Start date: 01/24/1950    Quit date: 01/24/1961    Years since quitting: 6.6  . Smokeless tobacco: Never Used  Substance and Sexual Activity  . Alcohol use: Yes    Alcohol/week: 0.0 standard drinks    Comment: not drank since 12/2009  . Drug use: No  . Sexual activity: Yes    Partners: Female  Lifestyle  . Physical activity    Days per week: Not on file    Minutes per session: Not on file  . Stress: Not on file   Relationships  . Social Herbalist on phone: Not on file    Gets together: Not on file    Attends religious service: Not on file    Active member of club or organization: Not on file    Attends meetings of clubs or organizations: Not on file    Relationship status: Not on file  . Intimate partner violence    Fear of current or ex partner: Not on file    Emotionally abused: Not on file    Physically abused: Not on file    Forced sexual activity: Not  on file  Other Topics Concern  . Not on file  Social History Narrative  . Not on file        Objective:    BP 124/62   Pulse 83   Temp (!) 97 F (36.1 C) (Oral)   Ht 6\' 2"  (1.88 m)   Wt 167 lb 3.2 oz (75.8 kg)   BMI 21.47 kg/m    Physical Exam Vitals signs reviewed.  Constitutional:      General: He is not in acute distress.    Appearance: Normal appearance. He is normal weight. He is not ill-appearing, toxic-appearing or diaphoretic.  HENT:     Head: Normocephalic and atraumatic.  Eyes:     General: No scleral icterus.       Right eye: No discharge.        Left eye: No discharge.     Conjunctiva/sclera: Conjunctivae normal.  Neck:     Musculoskeletal: Normal range of motion.  Cardiovascular:     Rate and Rhythm: Normal rate. Rhythm irregularly irregular.     Heart sounds: Murmur present. Systolic murmur present with a grade of 3/6. No friction rub. No gallop.   Pulmonary:     Effort: Pulmonary effort is normal. No respiratory distress.     Breath sounds: Normal breath sounds. No stridor. No wheezing, rhonchi or rales.  Musculoskeletal: Normal range of motion.  Skin:    General: Skin is warm and dry.  Neurological:     Mental Status: He is alert and oriented to person, place, and time. Mental status is at baseline.  Psychiatric:        Mood and Affect: Mood normal.        Behavior: Behavior normal.        Thought Content: Thought content normal.        Judgment: Judgment normal.     Lab Results   Component Value Date   TSH 1.520 01/29/2018   Lab Results  Component Value Date   WBC 7.5 05/30/2018   HGB 15.1 05/30/2018   HCT 43.7 05/30/2018   MCV 93 05/30/2018   PLT 270 05/30/2018   Lab Results  Component Value Date   NA 138 05/30/2018   K 4.3 05/30/2018   CO2 24 05/30/2018   GLUCOSE 100 (H) 05/30/2018   BUN 10 05/30/2018   CREATININE 0.88 05/30/2018   BILITOT 0.7 05/30/2018   ALKPHOS 95 05/30/2018   AST 21 05/30/2018   ALT 17 05/30/2018   PROT 6.8 05/30/2018   ALBUMIN 4.6 05/30/2018   CALCIUM 9.5 05/30/2018   ANIONGAP 8 05/05/2016   Lab Results  Component Value Date   CHOL 178 05/30/2018   Lab Results  Component Value Date   HDL 68 05/30/2018   Lab Results  Component Value Date   LDLCALC 96 05/30/2018   Lab Results  Component Value Date   TRIG 71 05/30/2018   Lab Results  Component Value Date   CHOLHDL 2.6 05/30/2018   No results found for: HGBA1C

## 2018-08-28 ENCOUNTER — Other Ambulatory Visit: Payer: Self-pay

## 2018-08-29 ENCOUNTER — Encounter: Payer: Self-pay | Admitting: Family Medicine

## 2018-08-29 ENCOUNTER — Ambulatory Visit (INDEPENDENT_AMBULATORY_CARE_PROVIDER_SITE_OTHER): Payer: Medicare HMO | Admitting: Family Medicine

## 2018-08-29 ENCOUNTER — Other Ambulatory Visit: Payer: Self-pay

## 2018-08-29 VITALS — BP 124/62 | HR 83 | Temp 97.0°F | Ht 74.0 in | Wt 167.2 lb

## 2018-08-29 DIAGNOSIS — Z7901 Long term (current) use of anticoagulants: Secondary | ICD-10-CM

## 2018-08-29 DIAGNOSIS — I480 Paroxysmal atrial fibrillation: Secondary | ICD-10-CM

## 2018-08-29 DIAGNOSIS — Z7689 Persons encountering health services in other specified circumstances: Secondary | ICD-10-CM | POA: Diagnosis not present

## 2018-08-29 DIAGNOSIS — G479 Sleep disorder, unspecified: Secondary | ICD-10-CM

## 2018-08-29 LAB — COAGUCHEK XS/INR WAIVED
INR: 2.6 — ABNORMAL HIGH (ref 0.9–1.1)
Prothrombin Time: 30.8 s

## 2018-08-29 MED ORDER — TRAZODONE HCL 50 MG PO TABS: 50.0000 mg | ORAL_TABLET | Freq: Every evening | ORAL | 2 refills | Status: DC | PRN

## 2018-08-31 ENCOUNTER — Telehealth: Payer: Medicare HMO

## 2018-09-03 DIAGNOSIS — Z08 Encounter for follow-up examination after completed treatment for malignant neoplasm: Secondary | ICD-10-CM | POA: Diagnosis not present

## 2018-09-03 DIAGNOSIS — Z85828 Personal history of other malignant neoplasm of skin: Secondary | ICD-10-CM | POA: Diagnosis not present

## 2018-09-03 DIAGNOSIS — L570 Actinic keratosis: Secondary | ICD-10-CM | POA: Diagnosis not present

## 2018-09-03 DIAGNOSIS — X32XXXD Exposure to sunlight, subsequent encounter: Secondary | ICD-10-CM | POA: Diagnosis not present

## 2018-09-03 DIAGNOSIS — C44629 Squamous cell carcinoma of skin of left upper limb, including shoulder: Secondary | ICD-10-CM | POA: Diagnosis not present

## 2018-09-27 ENCOUNTER — Other Ambulatory Visit: Payer: Medicare HMO

## 2018-09-27 ENCOUNTER — Other Ambulatory Visit: Payer: Self-pay

## 2018-09-27 ENCOUNTER — Other Ambulatory Visit: Payer: Self-pay | Admitting: Family Medicine

## 2018-09-27 DIAGNOSIS — E7800 Pure hypercholesterolemia, unspecified: Secondary | ICD-10-CM

## 2018-09-27 DIAGNOSIS — I10 Essential (primary) hypertension: Secondary | ICD-10-CM

## 2018-09-27 DIAGNOSIS — I482 Chronic atrial fibrillation, unspecified: Secondary | ICD-10-CM

## 2018-09-28 LAB — CMP14+EGFR
ALT: 18 IU/L (ref 0–44)
AST: 21 IU/L (ref 0–40)
Albumin/Globulin Ratio: 2 (ref 1.2–2.2)
Albumin: 4.3 g/dL (ref 3.6–4.6)
Alkaline Phosphatase: 91 IU/L (ref 39–117)
BUN/Creatinine Ratio: 13 (ref 10–24)
BUN: 13 mg/dL (ref 8–27)
Bilirubin Total: 0.8 mg/dL (ref 0.0–1.2)
CO2: 23 mmol/L (ref 20–29)
Calcium: 9.3 mg/dL (ref 8.6–10.2)
Chloride: 96 mmol/L (ref 96–106)
Creatinine, Ser: 1.01 mg/dL (ref 0.76–1.27)
GFR calc Af Amer: 78 mL/min/{1.73_m2} (ref >59)
GFR calc non Af Amer: 68 mL/min/{1.73_m2} (ref >59)
Globulin, Total: 2.1 g/dL (ref 1.5–4.5)
Glucose: 108 mg/dL — ABNORMAL HIGH (ref 65–99)
Potassium: 4.1 mmol/L (ref 3.5–5.2)
Sodium: 134 mmol/L (ref 134–144)
Total Protein: 6.4 g/dL (ref 6.0–8.5)

## 2018-09-28 LAB — LIPID PANEL
Chol/HDL Ratio: 2.2 ratio (ref 0.0–5.0)
Cholesterol, Total: 155 mg/dL (ref 100–199)
HDL: 72 mg/dL (ref >39)
LDL Chol Calc (NIH): 69 mg/dL (ref 0–99)
Triglycerides: 69 mg/dL (ref 0–149)
VLDL Cholesterol Cal: 14 mg/dL (ref 5–40)

## 2018-10-02 DIAGNOSIS — G479 Sleep disorder, unspecified: Secondary | ICD-10-CM | POA: Insufficient documentation

## 2018-10-02 NOTE — Progress Notes (Signed)
Assessment & Plan:  1-2. Paroxysmal atrial fibrillation ([facility])/Long term (current) use of anticoagulants Description   Continue taking 1 tablet every day except Wednesdays take 1/2 tablet.  INR today 2.5 (goal is 2-3). Next INR in 4 weeks.    - CoaguChek XS/INR Waived  3. Difficulty sleeping - Patient was weaned off Xanax for sleep. He has failed therapy with Trazodone and Lunesta.  - temazepam (RESTORIL) 7.5 MG capsule; Take 1 capsule (7.5 mg total) by mouth at bedtime as needed for sleep.  Dispense: 30 capsule; Refill: 0   Return in about 4 weeks (around 10/31/2018) for INR, sleep, & follow-up of chronic medication conditions.  [name], MSN, APRN, FNP-C [facility]  Subjective:    Patient ID: Quintrell Hamlett., male    DOB: 20-May-1931, 85 y.o.   MRN: [id number]  Patient Care Team: Loman Brooklyn, FNP as PCP - General (Family Medicine) Josue Hector, MD as Attending Physician (Cardiology) [name], MD as Consulting Physician (Gastroenterology) Okey Regal, OD (Optometry) Rexene Alberts, MD as Consulting Physician (Cardiothoracic Surgery) Ilean China, RN as Registered Nurse   Chief Complaint:  Chief Complaint  Patient presents with  . protime recheck    HPI: Holman Maratea. is a 85 y.o. male presenting on 10/03/2018 for protime recheck  Patient was started on Trazodone 50 mg PO QHS a month ago to help with sleep in an effort to wean him off of Xanax. He reports he only took the medication for a couple of days because it made him so drowsy the following day. He states he falls asleep fine but wakes up frequently and cannot stay asleep.   Patient currently taking Warfarin 2.5 mg every Wednesday and 5 mg all other days of the week due to a history of paroxysmal atrial fibrillation. His last INR was 2.6; he has a goal of 2-3.   New complaints: None   Relevant past medical, surgical, family and social history reviewed and  updated as indicated. Interim medical history since our last visit reviewed.  Allergies and medications reviewed and updated.  DATA REVIEWED: CHART IN EPIC  ROS: Negative unless specifically indicated above in HPI.    Current Outpatient Medications:  .  Cholecalciferol (VITAMIN D-3 PO), Take 2,000 mg by mouth every morning. , Disp: , Rfl:  .  losartan (COZAAR) 50 MG tablet, Take 1 tablet (50 mg total) by mouth every morning., Disp: 90 tablet, Rfl: 1 .  lovastatin (MEVACOR) 40 MG tablet, TAKE 1 TABLET AT BEDTIME, Disp: 90 tablet, Rfl: 1 .  metoprolol tartrate (LOPRESSOR) 25 MG tablet, Take 1 tablet (25 mg total) by mouth 2 (two) times daily., Disp: 180 tablet, Rfl: 3 .  Multiple Vitamin (MULTIVITAMIN) tablet, Take 1 tablet by mouth every morning. , Disp: , Rfl:  .  nitroGLYCERIN (NITROSTAT) 0.4 MG SL tablet, PLACE 1 TABLET (0.4 MG TOTAL) UNDER THE TONGUE EVERY 5 (FIVE) MINUTES AS NEEDED FOR CHEST PAIN., Disp: 25 tablet, Rfl: 3 .  Omega-3 Fatty Acids (FISH OIL) 1000 MG CAPS, Take 2 capsules by mouth every morning. , Disp: , Rfl:  .  omeprazole (PRILOSEC) 20 MG capsule, TAKE 1 CAPSULE TWICE DAILY 30 MINUTES PRIOR TO BREAKFAST AND SUPPER, Disp: 180 capsule, Rfl: 3 .  Probiotic Product (ALIGN) 4 MG CAPS, Take 4 mg by mouth daily as needed. , Disp: , Rfl:  .  warfarin (COUMADIN) 5 MG tablet, TAKE 1 TO 1 AND 1/2 TABLETS DAILY  AS DIRECTED, Disp: 135 tablet, Rfl: 1 .  temazepam (RESTORIL) 7.5 MG capsule, Take 1 capsule (7.5 mg total) by mouth at bedtime as needed for sleep., Disp: 30 capsule, Rfl: 0  Allergies  Allergen Reactions  . Doxycycline Diarrhea  . Contrast Media [Iodinated Diagnostic Agents]     Syncope  . Lunesta [Eszopiclone]     "felt WILD"  . Morphine And Related Nausea And Vomiting  . Trazodone And Nefazodone Other (See Comments)    Extremely drowsy the next day after taking.   Past Medical History:  Diagnosis Date  . Anxiety   . Atrial fibrillation ([place])   . C. difficile  colitis   . Enteritis due to Clostridium difficile, history of 11/21/2012  . GERD (gastroesophageal reflux disease)   . Hyperlipidemia   . Hypertension   . Vitamin D deficiency     Past Surgical History:  Procedure Laterality Date  . CARDIOVERSION N/A 05/05/2016   Procedure: CARDIOVERSION;  Surgeon: Josue Hector, MD;  Location: [place];  Service: Cardiovascular;  Laterality: N/A;  . CAROTID ENDARTERECTOMY Left 2003  . CAROTID-SUBCLAVIAN BYPASS GRAFT Right   . COLONOSCOPY  2005   hyperplastic polyp x 1 with no adenoma  . COLONOSCOPY N/A 04/11/2014   [id number] diverticulosis in the sigmoid colon/left colon is redundant  . CORONARY ARTERY BYPASS GRAFT    . ESOPHAGEAL DILATION N/A 04/11/2014   Procedure: ESOPHAGEAL DILATION;  Surgeon: [name], MD;  [facility];  Service: Endoscopy;  Laterality: N/A;  . ESOPHAGOGASTRODUODENOSCOPY N/A 04/11/2014   ID:[id number] at the gastro junction/moderate erosive/duodenal web  . EYE SURGERY Bilateral    cataracts  . SKIN CANCER EXCISION      Social History   Socioeconomic History  . Marital status: Married    Spouse name: ruby  . Number of children: 1  . Years of education: Not on file  . Highest education level: Not on file  Occupational History  . Occupation: retired    Fish farm manager: [employer]  Social Needs  . Financial resource strain: Not on file  . Food insecurity    Worry: Not on file    Inability: Not on file  . Transportation needs    Medical: Not on file    Non-medical: Not on file  Tobacco Use  . Smoking status: Former Smoker    Types: Cigarettes    Start date: 01/24/1950    Quit date: 01/24/1961    Years since quitting: 57.7  . Smokeless tobacco: Never Used  Substance and Sexual Activity  . Alcohol use: Yes    Alcohol/week: 0.0 standard drinks    Comment: not drank since 12/2009  . Drug use: No  . Sexual activity: Yes    Partners: Female  Lifestyle  . Physical activity    Days per week: Not on file     Minutes per session: Not on file  . Stress: Not on file  Relationships  . Social Herbalist on phone: Not on file    Gets together: Not on file    Attends religious service: Not on file    Active member of club or organization: Not on file    Attends meetings of clubs or organizations: Not on file    Relationship status: Not on file  . Intimate partner violence    Fear of current or ex partner: Not on file    Emotionally abused: Not on file    Physically abused: Not on file  Forced sexual activity: Not on file  Other Topics Concern  . Not on file  Social History Narrative  . Not on file        Objective:    BP 113/71   Pulse 94   Temp 98.1 F (36.7 C) (Temporal)   Ht 6\' 2"  (1.88 m)   Wt 167 lb (75.8 kg)   SpO2 100%   BMI 21.44 kg/m   Physical Exam Vitals signs reviewed.  Constitutional:      General: He is not in acute distress.    Appearance: Normal appearance. He is normal weight. He is not ill-appearing, toxic-appearing or diaphoretic.  HENT:     Head: Normocephalic and atraumatic.  Eyes:     General: No scleral icterus.       Right eye: No discharge.        Left eye: No discharge.     Conjunctiva/sclera: Conjunctivae normal.  Neck:     Musculoskeletal: Normal range of motion.  Cardiovascular:     Rate and Rhythm: Normal rate and regular rhythm.     Heart sounds: Murmur present. Systolic murmur present with a grade of 3/6. No friction rub. No gallop.   Pulmonary:     Effort: Pulmonary effort is normal. No respiratory distress.     Breath sounds: Normal breath sounds. No stridor. No wheezing, rhonchi or rales.  Musculoskeletal: Normal range of motion.  Skin:    General: Skin is warm and dry.  Neurological:     Mental Status: He is alert and oriented to person, place, and time. Mental status is at baseline.  Psychiatric:        Mood and Affect: Mood normal.        Behavior: Behavior normal.        Thought Content: Thought content normal.         Judgment: Judgment normal.     Lab Results  Component Value Date   TSH 1.520 01/29/2018   Lab Results  Component Value Date   WBC 7.5 05/30/2018   HGB 15.1 05/30/2018   HCT 43.7 05/30/2018   MCV 93 05/30/2018   PLT 270 05/30/2018   Lab Results  Component Value Date   NA 134 09/27/2018   K 4.1 09/27/2018   CO2 23 09/27/2018   GLUCOSE 108 (H) 09/27/2018   BUN 13 09/27/2018   CREATININE 1.01 09/27/2018   BILITOT 0.8 09/27/2018   ALKPHOS 91 09/27/2018   AST 21 09/27/2018   ALT 18 09/27/2018   PROT 6.4 09/27/2018   ALBUMIN 4.3 09/27/2018   CALCIUM 9.3 09/27/2018   ANIONGAP 8 05/05/2016   Lab Results  Component Value Date   CHOL 155 09/27/2018   Lab Results  Component Value Date   HDL 72 09/27/2018   Lab Results  Component Value Date   LDLCALC 96 05/30/2018   Lab Results  Component Value Date   TRIG 69 09/27/2018   Lab Results  Component Value Date   CHOLHDL 2.2 09/27/2018

## 2018-10-03 ENCOUNTER — Encounter: Payer: Self-pay | Admitting: Family Medicine

## 2018-10-03 ENCOUNTER — Other Ambulatory Visit: Payer: Self-pay

## 2018-10-03 ENCOUNTER — Ambulatory Visit (INDEPENDENT_AMBULATORY_CARE_PROVIDER_SITE_OTHER): Payer: Medicare HMO | Admitting: Family Medicine

## 2018-10-03 VITALS — BP 113/71 | HR 94 | Temp 98.1°F | Ht 74.0 in | Wt 167.0 lb

## 2018-10-03 DIAGNOSIS — Z7901 Long term (current) use of anticoagulants: Secondary | ICD-10-CM

## 2018-10-03 DIAGNOSIS — I480 Paroxysmal atrial fibrillation: Secondary | ICD-10-CM | POA: Diagnosis not present

## 2018-10-03 DIAGNOSIS — G479 Sleep disorder, unspecified: Secondary | ICD-10-CM | POA: Diagnosis not present

## 2018-10-03 LAB — COAGUCHEK XS/INR WAIVED
INR: 2.5 — ABNORMAL HIGH (ref 0.9–1.1)
Prothrombin Time: 29.7 s

## 2018-10-03 MED ORDER — LOSARTAN POTASSIUM 50 MG PO TABS: 50.0000 mg | ORAL_TABLET | Freq: Every morning | ORAL | 1 refills | Status: DC

## 2018-10-03 MED ORDER — TEMAZEPAM 7.5 MG PO CAPS: 7.5000 mg | ORAL_CAPSULE | Freq: Every evening | ORAL | 0 refills | Status: DC | PRN

## 2018-10-04 ENCOUNTER — Telehealth: Payer: Self-pay | Admitting: *Deleted

## 2018-10-04 MED ORDER — BELSOMRA 10 MG PO TABS: 10.0000 mg | ORAL_TABLET | Freq: Every day | ORAL | 1 refills | Status: DC

## 2018-10-04 NOTE — Addendum Note (Signed)
Addended by: Loman Brooklyn on: 10/04/2018 04:16 PM   Modules accepted: Orders

## 2018-10-04 NOTE — Telephone Encounter (Signed)
Fax from [facility] with following note: Restoril/Temazepam 7.5 mg not covered Also 7.5 mg generic still quite expensive Please consider Belsomra, per formulary or Temazepam 15 mg or 30 mg, as it is quite cheaper

## 2018-10-09 ENCOUNTER — Ambulatory Visit: Payer: Medicare HMO | Admitting: Family Medicine

## 2018-10-15 DIAGNOSIS — L570 Actinic keratosis: Secondary | ICD-10-CM | POA: Diagnosis not present

## 2018-10-15 DIAGNOSIS — Z08 Encounter for follow-up examination after completed treatment for malignant neoplasm: Secondary | ICD-10-CM | POA: Diagnosis not present

## 2018-10-15 DIAGNOSIS — Z85828 Personal history of other malignant neoplasm of skin: Secondary | ICD-10-CM | POA: Diagnosis not present

## 2018-10-15 DIAGNOSIS — C4441 Basal cell carcinoma of skin of scalp and neck: Secondary | ICD-10-CM | POA: Diagnosis not present

## 2018-10-15 DIAGNOSIS — X32XXXD Exposure to sunlight, subsequent encounter: Secondary | ICD-10-CM | POA: Diagnosis not present

## 2018-10-30 ENCOUNTER — Other Ambulatory Visit: Payer: Self-pay

## 2018-10-31 ENCOUNTER — Ambulatory Visit (INDEPENDENT_AMBULATORY_CARE_PROVIDER_SITE_OTHER): Payer: Medicare HMO | Admitting: Family Medicine

## 2018-10-31 ENCOUNTER — Encounter: Payer: Self-pay | Admitting: Family Medicine

## 2018-10-31 VITALS — BP 113/74 | HR 86 | Temp 96.9°F | Ht 74.0 in | Wt 168.7 lb

## 2018-10-31 DIAGNOSIS — I700 Atherosclerosis of aorta: Secondary | ICD-10-CM

## 2018-10-31 DIAGNOSIS — E7800 Pure hypercholesterolemia, unspecified: Secondary | ICD-10-CM | POA: Diagnosis not present

## 2018-10-31 DIAGNOSIS — I480 Paroxysmal atrial fibrillation: Secondary | ICD-10-CM

## 2018-10-31 DIAGNOSIS — I6523 Occlusion and stenosis of bilateral carotid arteries: Secondary | ICD-10-CM | POA: Diagnosis not present

## 2018-10-31 DIAGNOSIS — Z7901 Long term (current) use of anticoagulants: Secondary | ICD-10-CM | POA: Diagnosis not present

## 2018-10-31 DIAGNOSIS — E559 Vitamin D deficiency, unspecified: Secondary | ICD-10-CM

## 2018-10-31 DIAGNOSIS — I10 Essential (primary) hypertension: Secondary | ICD-10-CM | POA: Diagnosis not present

## 2018-10-31 DIAGNOSIS — N400 Enlarged prostate without lower urinary tract symptoms: Secondary | ICD-10-CM

## 2018-10-31 DIAGNOSIS — G458 Other transient cerebral ischemic attacks and related syndromes: Secondary | ICD-10-CM | POA: Diagnosis not present

## 2018-10-31 DIAGNOSIS — G479 Sleep disorder, unspecified: Secondary | ICD-10-CM

## 2018-10-31 DIAGNOSIS — Z23 Encounter for immunization: Secondary | ICD-10-CM | POA: Diagnosis not present

## 2018-10-31 LAB — COAGUCHEK XS/INR WAIVED
INR: 2 — ABNORMAL HIGH (ref 0.9–1.1)
Prothrombin Time: 23.8 s

## 2018-10-31 MED ORDER — LOVASTATIN 40 MG PO TABS: 40.0000 mg | ORAL_TABLET | Freq: Every day | ORAL | 1 refills | Status: DC

## 2018-10-31 NOTE — Progress Notes (Signed)
Assessment & Plan:  1-2. Paroxysmal atrial fibrillation (HCC)/Long term (current) use of anticoagulants Description   Continue taking 1 tablet every day except Wednesdays take 1/2 tablet.  INR today 2.0 (goal is 2-3). Next INR in 6 weeks.    - CoaguChek XS/INR Waived  3. Vitamin D deficiency - Continue vitamin D supplement. Last vitamin D level 05/30/2018 WNL.   4. Essential hypertension - Well controlled on current regimen.   5. HYPERCHOLESTEROLEMIA  IIA - Well controlled on current regimen.   6. Thoracic aorta atherosclerosis ([place]) - Patient taking lovastatin 40 mg QD. Managed by cardiology.   7. Subclavian steal syndrome - Managed by cardiology. Patient does take lovastatin 40 mg QD.   8. Bilateral carotid artery stenosis - Managed by cardiology. Patient does take lovastatin 40 mg QD.   9. Hyperplasia of prostate - No urinary symptoms.   10. Difficulty sleeping - Patient does not wish to try any other medications for sleep. Failed therapy with Trazodone. Unable to afford Temazepam 7.5 mg or Belsomra. Declined Temazepam 15 mg even with information that it cost < $7.   11. Need for immunization against influenza - Flu Vaccine QUAD High Dose(Fluad)   Return in about 6 weeks (around 12/12/2018) for INR.  Danny Limes, MSN, APRN, FNP-C [facility]  Subjective:    Patient ID: Danny [name]., male    DOB: 10-16-1931, 85 y.o.   MRN: [id number]  Patient Care Team: Loman Brooklyn, FNP as PCP - General (Family Medicine) Josue Hector, MD as Attending Physician (Cardiology) [name], MD as Consulting Physician (Gastroenterology) Okey Regal, OD (Optometry) Rexene Alberts, MD as Consulting Physician (Cardiothoracic Surgery) Ilean China, RN as Registered Nurse   Chief Complaint:  Chief Complaint  Patient presents with  . Anticoagulation  . Medical Management of Chronic Issues    HPI: Danny Preval. is a 85 y.o. male  presenting on 10/31/2018 for Anticoagulation and Medical Management of Chronic Issues  Anticoagulation: Patient here for anticoagulation monitoring. Indication: atrial fibrillation Bleeding Signs/Symptoms:  None Thromboembolic Signs/Symptoms:  None  Missed Coumadin Doses:  None Medication Changes:  no Dietary Changes:  no Bacterial/Viral Infection:  no  Other Concerns:  no   Patient does see his dermatologist, Dr. Nevada Crane in [place], regularly.  Patient sees Dr. Johnsie Cancel for cardiology related medical problems.   He did not pick up the Belsomra that was sent in for him at his last visit due to a cost of $46. He is not interested in trying any other medications to help him sleep. States he will just stay awake at night.   New complaints: None   Relevant past medical, surgical, family and social history reviewed and updated as indicated. Interim medical history since our last visit reviewed.  Allergies and medications reviewed and updated.  DATA REVIEWED: CHART IN EPIC  ROS: Negative unless specifically indicated above in HPI.    Current Outpatient Medications:  .  Cholecalciferol (VITAMIN D-3 PO), Take 2,000 mg by mouth every morning. , Disp: , Rfl:  .  losartan (COZAAR) 50 MG tablet, Take 1 tablet (50 mg total) by mouth every morning., Disp: 90 tablet, Rfl: 1 .  lovastatin (MEVACOR) 40 MG tablet, Take 1 tablet (40 mg total) by mouth at bedtime., Disp: 90 tablet, Rfl: 1 .  metoprolol tartrate (LOPRESSOR) 25 MG tablet, Take 1 tablet (25 mg total) by mouth 2 (two) times daily., Disp: 180 tablet, Rfl: 3 .  Multiple  Vitamin (MULTIVITAMIN) tablet, Take 1 tablet by mouth every morning. , Disp: , Rfl:  .  nitroGLYCERIN (NITROSTAT) 0.4 MG SL tablet, PLACE 1 TABLET (0.4 MG TOTAL) UNDER THE TONGUE EVERY 5 (FIVE) MINUTES AS NEEDED FOR CHEST PAIN., Disp: 25 tablet, Rfl: 3 .  Omega-3 Fatty Acids (FISH OIL) 1000 MG CAPS, Take 2 capsules by mouth every morning. , Disp: , Rfl:  .  omeprazole  (PRILOSEC) 20 MG capsule, TAKE 1 CAPSULE TWICE DAILY 30 MINUTES PRIOR TO BREAKFAST AND SUPPER, Disp: 180 capsule, Rfl: 3 .  Probiotic Product (ALIGN) 4 MG CAPS, Take 4 mg by mouth daily as needed. , Disp: , Rfl:  .  warfarin (COUMADIN) 5 MG tablet, TAKE 1 TO 1 AND 1/2 TABLETS DAILY AS DIRECTED, Disp: 135 tablet, Rfl: 1   Allergies  Allergen Reactions  . Doxycycline Diarrhea  . Contrast Media [Iodinated Diagnostic Agents]     Syncope  . Lunesta [Eszopiclone]     "felt WILD"  . Morphine And Related Nausea And Vomiting  . Trazodone And Nefazodone Other (See Comments)    Extremely drowsy the next day after taking.   Past Medical History:  Diagnosis Date  . Anxiety   . Atrial fibrillation ([place])   . CAD, ARTERY BYPASS GRAFT 05/19/2008   Qualifier: Diagnosis of  By: Danny [name], Danny [name], Danny [name]    . Enteritis due to Clostridium difficile, history of 11/21/2012  . GERD (gastroesophageal reflux disease)   . Hyperlipidemia   . Hypertension   . 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 Vitamin D deficiency     Past Surgical History:  Procedure Laterality Date  . CARDIOVERSION N/A 05/05/2016   Procedure: CARDIOVERSION;  Surgeon: Josue Hector, MD;  Location: [place];  Service: Cardiovascular;  Laterality: N/A;  . CAROTID ENDARTERECTOMY Left 2003  . CAROTID-SUBCLAVIAN BYPASS GRAFT Right   . COLONOSCOPY  2005   hyperplastic polyp x 1 with no adenoma  . COLONOSCOPY N/A 04/11/2014   [id number] diverticulosis in the sigmoid colon/left colon is redundant  . CORONARY ARTERY BYPASS GRAFT    . ESOPHAGEAL DILATION N/A 04/11/2014   Procedure: ESOPHAGEAL DILATION;  Surgeon: [name], MD;  [facility];  Service: Endoscopy;  Laterality: N/A;  . ESOPHAGOGASTRODUODENOSCOPY N/A 04/11/2014   [id number] at the gastro junction/moderate erosive/duodenal web  . EYE SURGERY Bilateral    cataracts  . SKIN CANCER EXCISION      Social History   Socioeconomic History  .  Marital status: Married    Spouse name: ruby  . Number of children: 1  . Years of education: Not on file  . Highest education level: Not on file  Occupational History  . Occupation: retired    Fish farm manager: [employer]  Social Needs  . Financial resource strain: Not on file  . Food insecurity    Worry: Not on file    Inability: Not on file  . Transportation needs    Medical: Not on file    Non-medical: Not on file  Tobacco Use  . Smoking status: Former Smoker    Types: Cigarettes    Start date: 01/24/1950    Quit date: 01/24/1961    Years since quitting: 57.8  . Smokeless tobacco: Never Used  Substance and Sexual Activity  . Alcohol use: Yes    Alcohol/week: 0.0 standard drinks    Comment: not drank since 12/2009  . Drug use: No  . Sexual activity: Yes    Partners: Female  Lifestyle  . Physical activity    Days per week: Not on file    Minutes per session: Not on file  . Stress: Not on file  Relationships  . Social Herbalist on phone: Not on file    Gets together: Not on file    Attends religious service: Not on file    Active member of club or organization: Not on file    Attends meetings of clubs or organizations: Not on file    Relationship status: Not on file  . Intimate partner violence    Fear of current or ex partner: Not on file    Emotionally abused: Not on file    Physically abused: Not on file    Forced sexual activity: Not on file  Other Topics Concern  . Not on file  Social History Narrative  . Not on file        Objective:    BP 113/74   Pulse 86   Temp (!) 96.9 F (36.1 C) (Temporal)   Ht 6\' 2"  (1.88 m)   Wt 168 lb 11.2 oz (76.5 kg)   SpO2 97%   BMI 21.66 kg/m   Physical Exam Vitals signs reviewed.  Constitutional:      General: He is not in acute distress.    Appearance: Normal appearance. He is normal weight. He is not ill-appearing, toxic-appearing or diaphoretic.  HENT:     Head: Normocephalic and atraumatic.     Right Ear:  Tympanic membrane, ear canal and external ear normal. There is no impacted cerumen.     Left Ear: Tympanic membrane, ear canal and external ear normal. There is no impacted cerumen.     Nose: Nose normal. No congestion or rhinorrhea.     Mouth/Throat:     Mouth: Mucous membranes are moist.     Pharynx: Oropharynx is clear. No oropharyngeal exudate or posterior oropharyngeal erythema.  Eyes:     General: No scleral icterus.       Right eye: No discharge.        Left eye: No discharge.     Conjunctiva/sclera: Conjunctivae normal.     Pupils: Pupils are equal, round, and reactive to light.  Neck:     Musculoskeletal: Normal range of motion and neck supple. No neck rigidity or muscular tenderness.  Cardiovascular:     Rate and Rhythm: Normal rate and regular rhythm.     Heart sounds: Murmur present. Systolic murmur present with a grade of 3/6. No friction rub. No gallop.   Pulmonary:     Effort: Pulmonary effort is normal. No respiratory distress.     Breath sounds: Normal breath sounds. No stridor. No wheezing, rhonchi or rales.  Abdominal:     General: Abdomen is flat. Bowel sounds are normal. There is no distension.     Palpations: Abdomen is soft. There is no mass.     Tenderness: There is no abdominal tenderness. There is no guarding or rebound.     Hernia: No hernia is present.  Musculoskeletal: Normal range of motion.     Right lower leg: No edema.     Left lower leg: No edema.  Lymphadenopathy:     Cervical: No cervical adenopathy.  Skin:    General: Skin is warm and dry.     Capillary Refill: Capillary refill takes less than 2 seconds.  Neurological:     General: No focal deficit present.     Mental Status: He is  alert and oriented to person, place, and time. Mental status is at baseline.  Psychiatric:        Mood and Affect: Mood normal.        Behavior: Behavior normal.        Thought Content: Thought content normal.        Judgment: Judgment normal.     Lab Results   Component Value Date   TSH 1.520 01/29/2018   Lab Results  Component Value Date   WBC 7.5 05/30/2018   HGB 15.1 05/30/2018   HCT 43.7 05/30/2018   MCV 93 05/30/2018   PLT 270 05/30/2018   Lab Results  Component Value Date   NA 134 09/27/2018   K 4.1 09/27/2018   CO2 23 09/27/2018   GLUCOSE 108 (H) 09/27/2018   BUN 13 09/27/2018   CREATININE 1.01 09/27/2018   BILITOT 0.8 09/27/2018   ALKPHOS 91 09/27/2018   AST 21 09/27/2018   ALT 18 09/27/2018   PROT 6.4 09/27/2018   ALBUMIN 4.3 09/27/2018   CALCIUM 9.3 09/27/2018   ANIONGAP 8 05/05/2016   Lab Results  Component Value Date   CHOL 155 09/27/2018   Lab Results  Component Value Date   HDL 72 09/27/2018   Lab Results  Component Value Date   LDLCALC 69 09/27/2018   Lab Results  Component Value Date   TRIG 69 09/27/2018   Lab Results  Component Value Date   CHOLHDL 2.2 09/27/2018

## 2018-12-06 DIAGNOSIS — C44622 Squamous cell carcinoma of skin of right upper limb, including shoulder: Secondary | ICD-10-CM | POA: Diagnosis not present

## 2018-12-12 ENCOUNTER — Other Ambulatory Visit: Payer: Self-pay

## 2018-12-12 NOTE — Progress Notes (Signed)
Assessment & Plan:  1-2. Paroxysmal atrial fibrillation (HCC)/Long term (current) use of anticoagulants Description   Continue taking 1 tablet every day except Wednesdays take 1/2 tablet.  INR today 2.3 (goal is 2-3). Next INR in 6 weeks.    - CoaguChek XS/INR Waived  3. Difficulty sleeping - Patient did report while Danny [name] was here that Danny [name] tried taking Unisom at bedtime to help with sleep which was not effective.  I again offered prescription medication to help with sleep and Danny [name] declined.  I explained the rationale for why Xanax was discontinued again.   Return in about 6 weeks (around 01/24/2019) for INR.  [name], MSN, APRN, FNP-C [facility]  Subjective:    Patient ID: Danny Belzer., male    DOB: January 12, 1932, 85 y.o.   MRN: [id number]  Patient Care Team: Loman Brooklyn, FNP as PCP - General (Family Medicine) Josue Hector, MD as Attending Physician (Cardiology) [name], MD as Consulting Physician (Gastroenterology) Okey Regal, OD (Optometry) Rexene Alberts, MD as Consulting Physician (Cardiothoracic Surgery) Ilean China, RN as Registered Nurse Allyn Kenner, MD as Consulting Physician (Dermatology)   Chief Complaint:  Chief Complaint  Patient presents with  . Anticoagulation    HPI: Danny Stranz. is a 85 y.o. male presenting on 12/13/2018 for Anticoagulation  Anticoagulation: Patient here for anticoagulation monitoring. Indication: atrial fibrillation Bleeding Signs/Symptoms:  None Thromboembolic Signs/Symptoms:  None  Missed Coumadin Doses:  None Medication Changes:  no Dietary Changes:  no Bacterial/Viral Infection:  no  Other Concerns:  no  New complaints: None  Social history:  Relevant past medical, surgical, family and social history reviewed and updated as indicated. Interim medical history since our last visit reviewed.  Allergies and medications reviewed and updated.  DATA REVIEWED: CHART IN  EPIC  ROS: Negative unless specifically indicated above in HPI.    Current Outpatient Medications:  .  Cholecalciferol (VITAMIN D-3 PO), Take 2,000 mg by mouth every morning. , Disp: , Rfl:  .  losartan (COZAAR) 50 MG tablet, Take 1 tablet (50 mg total) by mouth every morning., Disp: 90 tablet, Rfl: 1 .  lovastatin (MEVACOR) 40 MG tablet, Take 1 tablet (40 mg total) by mouth at bedtime., Disp: 90 tablet, Rfl: 1 .  metoprolol tartrate (LOPRESSOR) 25 MG tablet, Take 1 tablet (25 mg total) by mouth 2 (two) times daily., Disp: 180 tablet, Rfl: 3 .  Multiple Vitamin (MULTIVITAMIN) tablet, Take 1 tablet by mouth every morning. , Disp: , Rfl:  .  nitroGLYCERIN (NITROSTAT) 0.4 MG SL tablet, PLACE 1 TABLET (0.4 MG TOTAL) UNDER THE TONGUE EVERY 5 (FIVE) MINUTES AS NEEDED FOR CHEST PAIN., Disp: 25 tablet, Rfl: 3 .  Omega-3 Fatty Acids (FISH OIL) 1000 MG CAPS, Take 2 capsules by mouth every morning. , Disp: , Rfl:  .  omeprazole (PRILOSEC) 20 MG capsule, TAKE 1 CAPSULE TWICE DAILY 30 MINUTES PRIOR TO BREAKFAST AND SUPPER, Disp: 180 capsule, Rfl: 3 .  Probiotic Product (ALIGN) 4 MG CAPS, Take 4 mg by mouth daily as needed. , Disp: , Rfl:  .  warfarin (COUMADIN) 5 MG tablet, TAKE 1 TO 1 AND 1/2 TABLETS DAILY AS DIRECTED, Disp: 135 tablet, Rfl: 1   Allergies  Allergen Reactions  . Doxycycline Diarrhea  . Contrast Media [Iodinated Diagnostic Agents]     Syncope  . Lunesta [Eszopiclone]     "felt WILD"  . Morphine And Related Nausea And Vomiting  . Trazodone  And Nefazodone Other (See Comments)    Extremely drowsy the next day after taking.   Past Medical History:  Diagnosis Date  . Anxiety   . Atrial fibrillation ([place])   . CAD, ARTERY BYPASS GRAFT 05/19/2008   Qualifier: Diagnosis of  By: Mare Ferrari, [place], Sherri    . Enteritis due to Clostridium difficile, history of 11/21/2012  . GERD (gastroesophageal reflux disease)   . Hyperlipidemia   . Hypertension   . Squamous cell carcinoma of skin of left  upper arm 01/14/2014  . Vitamin D deficiency     Past Surgical History:  Procedure Laterality Date  . CARDIOVERSION N/A 05/05/2016   Procedure: CARDIOVERSION;  Surgeon: Josue Hector, MD;  Location: [place];  Service: Cardiovascular;  Laterality: N/A;  . CAROTID ENDARTERECTOMY Left 2003  . CAROTID-SUBCLAVIAN BYPASS GRAFT Right   . COLONOSCOPY  2005   hyperplastic polyp x 1 with no adenoma  . COLONOSCOPY N/A 04/11/2014   [id number] diverticulosis in the sigmoid colon/left colon is redundant  . CORONARY ARTERY BYPASS GRAFT    . ESOPHAGEAL DILATION N/A 04/11/2014   Procedure: ESOPHAGEAL DILATION;  Surgeon: [name], MD;  [facility];  Service: Endoscopy;  Laterality: N/A;  . ESOPHAGOGASTRODUODENOSCOPY N/A 04/11/2014   [id number] at the gastro junction/moderate erosive/duodenal web  . EYE SURGERY Bilateral    cataracts  . SKIN CANCER EXCISION      Social History   Socioeconomic History  . Marital status: Married    Spouse name: ruby  . Number of children: 1  . Years of education: Not on file  . Highest education level: Not on file  Occupational History  . Occupation: retired    Fish farm manager: [employer]  Social Needs  . Financial resource strain: Not on file  . Food insecurity    Worry: Not on file    Inability: Not on file  . Transportation needs    Medical: Not on file    Non-medical: Not on file  Tobacco Use  . Smoking status: Former Smoker    Types: Cigarettes    Start date: 01/24/1950    Quit date: 01/24/1961    Years since quitting: 57.9  . Smokeless tobacco: Never Used  Substance and Sexual Activity  . Alcohol use: Yes    Alcohol/week: 0.0 standard drinks    Comment: not drank since 12/2009  . Drug use: No  . Sexual activity: Yes    Partners: Female  Lifestyle  . Physical activity    Days per week: Not on file    Minutes per session: Not on file  . Stress: Not on file  Relationships  . Social Herbalist on phone: Not on file    Gets  together: Not on file    Attends religious service: Not on file    Active member of club or organization: Not on file    Attends meetings of clubs or organizations: Not on file    Relationship status: Not on file  . Intimate partner violence    Fear of current or ex partner: Not on file    Emotionally abused: Not on file    Physically abused: Not on file    Forced sexual activity: Not on file  Other Topics Concern  . Not on file  Social History Narrative  . Not on file        Objective:    BP 131/77   Pulse 93   Temp (!) 97.5 F (36.4 C) (  Temporal)   Ht 6\' 2"  (1.88 m)   Wt 170 lb 12.8 oz (77.5 kg)   SpO2 99%   BMI 21.93 kg/m   Physical Exam Vitals signs reviewed.  Constitutional:      General: Danny [name] is not in acute distress.    Appearance: Normal appearance. Danny [name] is normal weight. Danny [name] is not ill-appearing, toxic-appearing or diaphoretic.  HENT:     Head: Normocephalic and atraumatic.  Eyes:     General: No scleral icterus.       Right eye: No discharge.        Left eye: No discharge.     Conjunctiva/sclera: Conjunctivae normal.  Neck:     Musculoskeletal: Normal range of motion.  Cardiovascular:     Rate and Rhythm: Normal rate and regular rhythm.     Heart sounds: Murmur present. Systolic murmur present with a grade of 3/6. No friction rub. No gallop.   Pulmonary:     Effort: Pulmonary effort is normal. No respiratory distress.     Breath sounds: Normal breath sounds. No stridor. No wheezing, rhonchi or rales.  Musculoskeletal: Normal range of motion.  Skin:    General: Skin is warm and dry.  Neurological:     Mental Status: Danny [name] is alert and oriented to person, place, and time. Mental status is at baseline.  Psychiatric:        Mood and Affect: Mood normal.        Behavior: Behavior normal.        Thought Content: Thought content normal.        Judgment: Judgment normal.     Lab Results  Component Value Date   TSH 1.520 01/29/2018   Lab Results  Component  Value Date   WBC 7.5 05/30/2018   HGB 15.1 05/30/2018   HCT 43.7 05/30/2018   MCV 93 05/30/2018   PLT 270 05/30/2018   Lab Results  Component Value Date   NA 134 09/27/2018   K 4.1 09/27/2018   CO2 23 09/27/2018   GLUCOSE 108 (H) 09/27/2018   BUN 13 09/27/2018   CREATININE 1.01 09/27/2018   BILITOT 0.8 09/27/2018   ALKPHOS 91 09/27/2018   AST 21 09/27/2018   ALT 18 09/27/2018   PROT 6.4 09/27/2018   ALBUMIN 4.3 09/27/2018   CALCIUM 9.3 09/27/2018   ANIONGAP 8 05/05/2016   Lab Results  Component Value Date   CHOL 155 09/27/2018   Lab Results  Component Value Date   HDL 72 09/27/2018   Lab Results  Component Value Date   LDLCALC 69 09/27/2018   Lab Results  Component Value Date   TRIG 69 09/27/2018   Lab Results  Component Value Date   CHOLHDL 2.2 09/27/2018   No results found for: HGBA1C

## 2018-12-13 ENCOUNTER — Encounter: Payer: Self-pay | Admitting: Family Medicine

## 2018-12-13 ENCOUNTER — Ambulatory Visit (INDEPENDENT_AMBULATORY_CARE_PROVIDER_SITE_OTHER): Payer: Medicare HMO | Admitting: Family Medicine

## 2018-12-13 VITALS — BP 131/77 | HR 93 | Temp 97.5°F | Ht 74.0 in | Wt 170.8 lb

## 2018-12-13 DIAGNOSIS — I480 Paroxysmal atrial fibrillation: Secondary | ICD-10-CM

## 2018-12-13 DIAGNOSIS — G479 Sleep disorder, unspecified: Secondary | ICD-10-CM | POA: Diagnosis not present

## 2018-12-13 DIAGNOSIS — Z7901 Long term (current) use of anticoagulants: Secondary | ICD-10-CM

## 2018-12-13 LAB — COAGUCHEK XS/INR WAIVED
INR: 2.3 — ABNORMAL HIGH (ref 0.9–1.1)
Prothrombin Time: 28.1 s

## 2019-01-02 ENCOUNTER — Other Ambulatory Visit: Payer: Self-pay | Admitting: Family Medicine

## 2019-01-28 ENCOUNTER — Other Ambulatory Visit: Payer: Self-pay

## 2019-01-29 ENCOUNTER — Ambulatory Visit (INDEPENDENT_AMBULATORY_CARE_PROVIDER_SITE_OTHER): Payer: Medicare HMO | Admitting: Family Medicine

## 2019-01-29 ENCOUNTER — Other Ambulatory Visit: Payer: Self-pay

## 2019-01-29 ENCOUNTER — Encounter: Payer: Self-pay | Admitting: Family Medicine

## 2019-01-29 VITALS — BP 135/79 | HR 85 | Temp 96.2°F | Ht 74.0 in | Wt 175.8 lb

## 2019-01-29 DIAGNOSIS — I480 Paroxysmal atrial fibrillation: Secondary | ICD-10-CM

## 2019-01-29 DIAGNOSIS — Z7901 Long term (current) use of anticoagulants: Secondary | ICD-10-CM | POA: Diagnosis not present

## 2019-01-29 LAB — COAGUCHEK XS/INR WAIVED
INR: 1.8 — ABNORMAL HIGH (ref 0.9–1.1)
Prothrombin Time: 21 s

## 2019-01-29 NOTE — Progress Notes (Signed)
Patient ID: Danny [name]., male   DOB: January 07, 1932, 86 y.o.   MRN: [id number]     Virtual Visit via Video Note   This visit type was conducted due to national recommendations for restrictions regarding the COVID-19 Pandemic (e.g. social distancing) in an effort to limit this patient's exposure and mitigate transmission in our community.  Due to her co-morbid illnesses, this patient is at least at moderate risk for complications without adequate follow up.  This format is felt to be most appropriate for this patient at this time.  All issues noted in this document were discussed and addressed.  A limited physical exam was performed with this format.  Please refer to the patient's chart for her consent to telehealth for [facility].   Pateint location : home Physician location: office  86 y.o. f/u PVD, CAD/CABG PAF and GERD.  CABG was in 2003 and had left CEA with right subclavian bypass in 2009. Dr Scot Dock follows his vascular disease and he has had known elevated velocities in the distal limb of his right graft but no intervention being considered Has not had right arm weakness April 2018 had PAF started on eliquis but cost prohibitive and changed to coumadin Successful [facility] April 2018  CRF;s include HTN and HLD Compliant with meds   Has an eye doctor he sees for macular degeneration and skin doctor for cancer Last LDL was 69  Last duplex July 2019 40-59% right ICA elevated velocities in distal limb of graft to subclavian Although BP 76 mmHg lower on right has biphasic distal pulses and no symptoms   Has palpitations sometimes when he wakes No angina was taking nitro for this Told him to take extra 12 of metoprolol instead as his afib is likely at rapid rate  No angina, no palpitations no arm weakness Compliant with meds INR been RX    ROS: Denies fever, malais, weight loss, blurry vision, decreased visual acuity, cough, sputum, SOB, hemoptysis, pleuritic pain, palpitaitons, heartburn,  abdominal pain, melena, lower extremity edema, claudication, or rash.  All other systems reviewed and negative  General:  BP 0000000 systolic  On left and 0000000 systolic on right  There were no vitals taken for this visit. Affect appropriate Healthy:  appears stated age 67: normal Neck supple with no adenopathy JVP normal bilateral  bruits no thyromegaly previous left CEA  Lungs clear with no wheezing and good diaphragmatic motion Heart:  S1/S2 no murmur, no rub, gallop or click PMI normal post sternotimy  Abdomen: benighn, BS positve, no tenderness, no AAA no bruit.  No HSM or HJR Left carotid and subclavian bruit with pulse deficit in Left arm  No edema Neuro non-focal Skin warm and dry Left foot in splint with some swelling     Current Outpatient Medications  Medication Sig Dispense Refill  . Cholecalciferol (VITAMIN D-3 PO) Take 2,000 mg by mouth every morning.     [name] losartan (COZAAR) 50 MG tablet Take 1 tablet (50 mg total) by mouth every morning. 90 tablet 1  . lovastatin (MEVACOR) 40 MG tablet Take 1 tablet (40 mg total) by mouth at bedtime. 90 tablet 1  . metoprolol tartrate (LOPRESSOR) 25 MG tablet Take 1 tablet (25 mg total) by mouth 2 (two) times daily. 180 tablet 3  . Multiple Vitamin (MULTIVITAMIN) tablet Take 1 tablet by mouth every morning.     . nitroGLYCERIN (NITROSTAT) 0.4 MG SL tablet PLACE 1 TABLET (0.4 MG TOTAL) UNDER THE TONGUE EVERY 5 (FIVE)  MINUTES AS NEEDED FOR CHEST PAIN. 25 tablet 3  . Omega-3 Fatty Acids (FISH OIL) 1000 MG CAPS Take 2 capsules by mouth every morning.     [name] omeprazole (PRILOSEC) 20 MG capsule TAKE 1 CAPSULE TWICE DAILY 30 MINUTES PRIOR TO BREAKFAST AND SUPPER 180 capsule 0  . Probiotic Product (ALIGN) 4 MG CAPS Take 4 mg by mouth daily as needed.     . warfarin (COUMADIN) 5 MG tablet TAKE 1 TO 1 AND 1/2 TABLETS DAILY AS DIRECTED 135 tablet 1   No current facility-administered medications for this visit.    Allergies  Doxycycline,  Contrast media [iodinated diagnostic agents], Lunesta [eszopiclone], Morphine and related, and Trazodone and nefazodone  Electrocardiogram:   11/30/12  SR rate 60 LAE otherwise normal  05/15/14  SR ate 47  Normal ECG   Time spent on encounter 15 minutes   Assessment and Plan CAD:  Distant CABG stable no chest pain    Bradycardia.:  Improved having tachy-palpitations in am can take extra 1/2 lopressor as needed   PVD:  Stenosis in carotid limb of right bypass and VB steal f/u duplex with UE dopplers ordered at [facility] office Not likely  Any good surgical options at his age symptoms stable   HTN:  Well controlled should follow BP in left arm which is higher   GI:  Improved with  diagnosis of gastritis and diverticulitis  Omeprazole bid   PAF:  Post [place] on 05/05/16  Now transitioned to coumadin due to cost  INR followed at Dr Charma Igo office INR Rx at 2.3 a month ago  Ortho:  Post left calcaneal fracture f/u ortho ? Podiatry for inserts   HLD:  On statin labs with primary continue statin   [name]

## 2019-01-29 NOTE — Progress Notes (Signed)
Assessment & Plan:  1-2. Paroxysmal atrial fibrillation (HCC)/Long term (current) use of anticoagulants - CoaguChek XS/INR Waived Description   Take an extra 2.5 mg today, then continue taking 1 tablet every day except Wednesdays take 1/2 tablet.  INR today 1.9 (goal is 2-3). Next INR in 4 weeks.      Follow up plan: Return in about 4 weeks (around 02/26/2019) for annual physical.  [name], MSN, APRN, FNP-C [facility]  Subjective:   Patient ID: Danny [name]., male    DOB: 1931/09/27, 86 y.o.   MRN: [id number]  HPI: Davione Feemster. is a 86 y.o. male presenting on 01/29/2019 for Coagulation Disorder  Anticoagulation: Patient here for anticoagulation monitoring. Indication: atrial fibrillation Bleeding Signs/Symptoms:  None Thromboembolic Signs/Symptoms:  None  Missed Coumadin Doses:  None Medication Changes:  no Dietary Changes:  more greens than usual for the New Year tradition. Bacterial/Viral Infection:  no  Other Concerns:  no   ROS: Negative unless specifically indicated above in HPI.   Relevant past medical history reviewed and updated as indicated.   Allergies and medications reviewed and updated.   Current Outpatient Medications:  .  Cholecalciferol (VITAMIN D-3 PO), Take 2,000 mg by mouth every morning. , Disp: , Rfl:  .  losartan (COZAAR) 50 MG tablet, Take 1 tablet (50 mg total) by mouth every morning., Disp: 90 tablet, Rfl: 1 .  lovastatin (MEVACOR) 40 MG tablet, Take 1 tablet (40 mg total) by mouth at bedtime., Disp: 90 tablet, Rfl: 1 .  metoprolol tartrate (LOPRESSOR) 25 MG tablet, Take 1 tablet (25 mg total) by mouth 2 (two) times daily., Disp: 180 tablet, Rfl: 3 .  Multiple Vitamin (MULTIVITAMIN) tablet, Take 1 tablet by mouth every morning. , Disp: , Rfl:  .  Omega-3 Fatty Acids (FISH OIL) 1000 MG CAPS, Take 2 capsules by mouth every morning. , Disp: , Rfl:  .  omeprazole (PRILOSEC) 20 MG capsule, TAKE 1 CAPSULE TWICE  DAILY 30 MINUTES PRIOR TO BREAKFAST AND SUPPER, Disp: 180 capsule, Rfl: 0 .  Probiotic Product (ALIGN) 4 MG CAPS, Take 4 mg by mouth daily as needed. , Disp: , Rfl:  .  warfarin (COUMADIN) 5 MG tablet, TAKE 1 TO 1 AND 1/2 TABLETS DAILY AS DIRECTED, Disp: 135 tablet, Rfl: 1 .  nitroGLYCERIN (NITROSTAT) 0.4 MG SL tablet, PLACE 1 TABLET (0.4 MG TOTAL) UNDER THE TONGUE EVERY 5 (FIVE) MINUTES AS NEEDED FOR CHEST PAIN., Disp: 25 tablet, Rfl: 3  Allergies  Allergen Reactions  . Doxycycline Diarrhea  . Contrast Media [Iodinated Diagnostic Agents]     Syncope  . Lunesta [Eszopiclone]     "felt WILD"  . Morphine And Related Nausea And Vomiting  . Trazodone And Nefazodone Other (See Comments)    Extremely drowsy the next day after taking.    Objective:   BP 135/79   Pulse 85   Temp (!) 96.2 F (35.7 C) (Temporal)   Ht 6\' 2"  (1.88 m)   Wt 175 lb 12.8 oz (79.7 kg)   SpO2 99%   BMI 22.57 kg/m    Physical Exam Vitals reviewed.  Constitutional:      General: He is not in acute distress.    Appearance: Normal appearance. He is normal weight. He is not ill-appearing, toxic-appearing or diaphoretic.  HENT:     Head: Normocephalic and atraumatic.  Eyes:     General: No scleral icterus.       Right eye: No discharge.  Left eye: No discharge.     Conjunctiva/sclera: Conjunctivae normal.  Cardiovascular:     Rate and Rhythm: Normal rate and regular rhythm.     Heart sounds: Murmur present. Systolic murmur present with a grade of 3/6. No friction rub. No gallop.   Pulmonary:     Effort: Pulmonary effort is normal. No respiratory distress.     Breath sounds: Normal breath sounds. No stridor. No wheezing, rhonchi or rales.  Musculoskeletal:        General: Normal range of motion.     Cervical back: Normal range of motion.  Skin:    General: Skin is warm and dry.  Neurological:     Mental Status: He is alert and oriented to person, place, and time. Mental status is at baseline.   Psychiatric:        Mood and Affect: Mood normal.        Behavior: Behavior normal.        Thought Content: Thought content normal.        Judgment: Judgment normal.

## 2019-01-31 ENCOUNTER — Telehealth: Payer: Self-pay | Admitting: Cardiovascular Disease

## 2019-01-31 NOTE — Telephone Encounter (Signed)

## 2019-02-01 ENCOUNTER — Encounter: Payer: Self-pay | Admitting: Family Medicine

## 2019-02-01 ENCOUNTER — Telehealth (INDEPENDENT_AMBULATORY_CARE_PROVIDER_SITE_OTHER): Payer: Medicare HMO | Admitting: Cardiovascular Disease

## 2019-02-01 ENCOUNTER — Encounter: Payer: Self-pay | Admitting: Cardiovascular Disease

## 2019-02-01 VITALS — BP 135/79 | HR 85 | Temp 96.2°F | Ht 74.0 in | Wt 175.0 lb

## 2019-02-01 DIAGNOSIS — Z951 Presence of aortocoronary bypass graft: Secondary | ICD-10-CM | POA: Diagnosis not present

## 2019-02-01 DIAGNOSIS — Z7901 Long term (current) use of anticoagulants: Secondary | ICD-10-CM | POA: Diagnosis not present

## 2019-02-01 DIAGNOSIS — K5792 Diverticulitis of intestine, part unspecified, without perforation or abscess without bleeding: Secondary | ICD-10-CM | POA: Diagnosis not present

## 2019-02-01 DIAGNOSIS — I10 Essential (primary) hypertension: Secondary | ICD-10-CM

## 2019-02-01 DIAGNOSIS — E785 Hyperlipidemia, unspecified: Secondary | ICD-10-CM | POA: Diagnosis not present

## 2019-02-01 DIAGNOSIS — I2510 Atherosclerotic heart disease of native coronary artery without angina pectoris: Secondary | ICD-10-CM

## 2019-02-01 DIAGNOSIS — R001 Bradycardia, unspecified: Secondary | ICD-10-CM | POA: Diagnosis not present

## 2019-02-01 DIAGNOSIS — I739 Peripheral vascular disease, unspecified: Secondary | ICD-10-CM

## 2019-02-01 DIAGNOSIS — K2970 Gastritis, unspecified, without bleeding: Secondary | ICD-10-CM

## 2019-02-01 DIAGNOSIS — I4891 Unspecified atrial fibrillation: Secondary | ICD-10-CM | POA: Diagnosis not present

## 2019-02-01 MED ORDER — NITROGLYCERIN 0.4 MG SL SUBL: SUBLINGUAL_TABLET | SUBLINGUAL | 3 refills | Status: DC

## 2019-02-01 NOTE — Addendum Note (Signed)
Addended by: Josue Hector on: 02/01/2019 01:52 PM   Modules accepted: Level of Service

## 2019-02-01 NOTE — Patient Instructions (Signed)
Medication Instructions:  Your physician recommends that you continue on your current medications as directed. Please refer to the Current Medication list given to you today.  *If you need a refill on your cardiac medications before your next appointment, please call your pharmacy*  Lab Work:  NONE.  If you have labs (blood work) drawn today and your tests are completely normal, you will receive your results only by: [name] MyChart Message (if you have MyChart) OR . A paper copy in the mail If you have any lab test that is abnormal or we need to change your treatment, we will call you to review the results.  Testing/Procedures: NONE  Follow-Up: At [facility], you and your health needs are our priority.  As part of our continuing mission to provide you with exceptional heart care, we have created designated Provider Care Teams.  These Care Teams include your primary Cardiologist (physician) and Advanced Practice Providers (APPs -  Physician Assistants and Nurse Practitioners) who all work together to provide you with the care you need, when you need it.  Your next appointment:   6 month(s)  The format for your next appointment:   In Person  Provider:     Other Instructions None      Thank you for choosing [facility] !

## 2019-02-20 ENCOUNTER — Other Ambulatory Visit: Payer: Self-pay

## 2019-02-20 ENCOUNTER — Other Ambulatory Visit: Payer: Medicare HMO

## 2019-02-20 DIAGNOSIS — E7800 Pure hypercholesterolemia, unspecified: Secondary | ICD-10-CM | POA: Diagnosis not present

## 2019-02-20 DIAGNOSIS — I480 Paroxysmal atrial fibrillation: Secondary | ICD-10-CM

## 2019-02-20 DIAGNOSIS — I10 Essential (primary) hypertension: Secondary | ICD-10-CM

## 2019-02-20 DIAGNOSIS — Z7901 Long term (current) use of anticoagulants: Secondary | ICD-10-CM | POA: Diagnosis not present

## 2019-02-21 LAB — LIPID PANEL
Chol/HDL Ratio: 2 ratio (ref 0.0–5.0)
Cholesterol, Total: 170 mg/dL (ref 100–199)
HDL: 87 mg/dL (ref >39)
LDL Chol Calc (NIH): 71 mg/dL (ref 0–99)
Triglycerides: 60 mg/dL (ref 0–149)
VLDL Cholesterol Cal: 12 mg/dL (ref 5–40)

## 2019-02-21 LAB — CBC WITH DIFFERENTIAL/PLATELET
Basophils Absolute: 0.1 10*3/uL (ref 0.0–0.2)
Basos: 1 %
EOS (ABSOLUTE): 0.2 10*3/uL (ref 0.0–0.4)
Eos: 4 %
Hematocrit: 45.5 % (ref 37.5–51.0)
Hemoglobin: 15.2 g/dL (ref 13.0–17.7)
Immature Grans (Abs): 0 10*3/uL (ref 0.0–0.1)
Immature Granulocytes: 0 %
Lymphocytes Absolute: 1.4 10*3/uL (ref 0.7–3.1)
Lymphs: 21 %
MCH: 31.7 pg (ref 26.6–33.0)
MCHC: 33.4 g/dL (ref 31.5–35.7)
MCV: 95 fL (ref 79–97)
Monocytes Absolute: 0.8 10*3/uL (ref 0.1–0.9)
Monocytes: 11 %
Neutrophils Absolute: 4.3 10*3/uL (ref 1.4–7.0)
Neutrophils: 63 %
Platelets: 239 10*3/uL (ref 150–450)
RBC: 4.79 x10E6/uL (ref 4.14–5.80)
RDW: 12.9 % (ref 11.6–15.4)
WBC: 6.8 10*3/uL (ref 3.4–10.8)

## 2019-02-21 LAB — CMP14+EGFR
ALT: 16 IU/L (ref 0–44)
AST: 20 IU/L (ref 0–40)
Albumin/Globulin Ratio: 1.8 (ref 1.2–2.2)
Albumin: 4.3 g/dL (ref 3.6–4.6)
Alkaline Phosphatase: 106 IU/L (ref 39–117)
BUN/Creatinine Ratio: 12 (ref 10–24)
BUN: 10 mg/dL (ref 8–27)
Bilirubin Total: 0.7 mg/dL (ref 0.0–1.2)
CO2: 26 mmol/L (ref 20–29)
Calcium: 9.2 mg/dL (ref 8.6–10.2)
Chloride: 97 mmol/L (ref 96–106)
Creatinine, Ser: 0.85 mg/dL (ref 0.76–1.27)
GFR calc Af Amer: 92 mL/min/{1.73_m2} (ref >59)
GFR calc non Af Amer: 79 mL/min/{1.73_m2} (ref >59)
Globulin, Total: 2.4 g/dL (ref 1.5–4.5)
Glucose: 94 mg/dL (ref 65–99)
Potassium: 4.2 mmol/L (ref 3.5–5.2)
Sodium: 136 mmol/L (ref 134–144)
Total Protein: 6.7 g/dL (ref 6.0–8.5)

## 2019-02-24 NOTE — Patient Instructions (Signed)

## 2019-02-24 NOTE — Progress Notes (Signed)
Assessment & Plan:  1. Well adult exam - Preventive health education provided. Patient is scheduled for COVID-19 vaccine on 02/28/19. Declined Shingrix. UTD with influenza and pneumonia vaccines.   2-3. Paroxysmal atrial fibrillation (HCC)/Long term (current) use of anticoagulants [Z79.01] Description   Continue taking 1 tablet every day except Wednesdays take 1/2 tablet.  INR today 2.2 (goal is 2-3). Next INR in 6 weeks.    - CoaguChek XS/INR Waived  4. Essential hypertension - Well controlled on current regimen.  - losartan (COZAAR) 50 MG tablet; Take 1 tablet (50 mg total) by mouth every morning.  Dispense: 90 tablet; Refill: 3  5. Thoracic aorta atherosclerosis ([place]) - Well controlled on current regimen. Continue statin and good BP control.   6. HYPERCHOLESTEROLEMIA  IIA - Well controlled on current regimen.  - lovastatin (MEVACOR) 40 MG tablet; Take 1 tablet (40 mg total) by mouth at bedtime.  Dispense: 90 tablet; Refill: 3  7. Vitamin D deficiency - Well controlled on current regimen.   8. Gastroesophageal reflux disease, unspecified whether esophagitis present - Well controlled on current regimen.  - omeprazole (PRILOSEC) 20 MG capsule; 1 capsule twice a day  Dispense: 180 capsule; Refill: 3   Follow-up: Return in about 6 weeks (around 04/09/2019) for INR.   Danny Limes, MSN, APRN, FNP-C [facility]  Subjective:  Patient ID: Danny [name]., male    DOB: [birth year]  Age: 86 y.o. MRN: [id number]  Patient Care Team: Loman Brooklyn, FNP as PCP - General (Family Medicine) Danny Hector, MD as Attending Physician (Cardiology) [name], MD as Consulting Physician (Gastroenterology) Danny [name], OD (Optometry) Danny Alberts, MD as Consulting Physician (Cardiothoracic Surgery) Danny China, RN as Registered Nurse Danny Kenner, MD as Consulting Physician (Dermatology)   CC:  Chief Complaint  Patient presents with  .  Coagulation Disorder  . Annual Exam    HPI Danny [name]. presents for his annual physical.   Occupation: retired, Marital status: married, Substance use: none Diet: regular, Exercise: none Last eye exam: summer 2020 - macular degeneration and started on Preservision.  Last dental exam: couple years ago Immunizations: Flu Vaccine: UTD  Tdap Vaccine: UTD  Shingrix Vaccine: declined Pneumonia Vaccine: completed COVID-19 Vaccine: scheduled for Thursday.   Advanced Directives Patient does not have advanced directives including DNR, living will, healthcare power of attorney, financial power of attorney and MOST form.   DEPRESSION SCREENING PHQ 2/9 Scores 02/26/2019 01/29/2019 12/13/2018 10/31/2018 10/03/2018 08/29/2018 02/05/2018  PHQ - 2 Score 0 0 0 0 0 0 0     Review of Systems  Constitutional: Negative for chills, fever, malaise/fatigue and weight loss.  HENT: Negative for congestion, ear discharge, ear pain, nosebleeds, sinus pain, sore throat and tinnitus.   Eyes: Negative for blurred vision, double vision, pain, discharge and redness.  Respiratory: Negative for cough, shortness of breath and wheezing.   Cardiovascular: Negative for chest pain, palpitations and leg swelling.  Gastrointestinal: Negative for abdominal pain, constipation, diarrhea, heartburn, nausea and vomiting.  Genitourinary: Negative for dysuria, frequency and urgency.  Musculoskeletal: Negative for myalgias.  Skin: Negative for rash.  Neurological: Negative for dizziness, seizures, weakness and headaches.  Psychiatric/Behavioral: Negative for depression, substance abuse and suicidal ideas. The patient is not nervous/anxious.     Current Outpatient Medications:  .  Cholecalciferol (VITAMIN D-3 PO), Take 2,000 mg by mouth every morning. , Disp: , Rfl:  .  losartan (COZAAR) 50 MG tablet, Take 1  tablet (50 mg total) by mouth every morning., Disp: 90 tablet, Rfl: 3 .  lovastatin (MEVACOR) 40 MG tablet, Take 1 tablet  (40 mg total) by mouth at bedtime., Disp: 90 tablet, Rfl: 3 .  metoprolol tartrate (LOPRESSOR) 25 MG tablet, Take 1 tablet (25 mg total) by mouth 2 (two) times daily., Disp: 180 tablet, Rfl: 3 .  Multiple Vitamin (MULTIVITAMIN) tablet, Take 1 tablet by mouth every morning. , Disp: , Rfl:  .  nitroGLYCERIN (NITROSTAT) 0.4 MG SL tablet, PLACE 1 TABLET (0.4 MG TOTAL) UNDER THE TONGUE EVERY 5 (FIVE) MINUTES AS NEEDED FOR CHEST PAIN., Disp: 25 tablet, Rfl: 3 .  Omega-3 Fatty Acids (FISH OIL) 1000 MG CAPS, Take 2 capsules by mouth every morning. , Disp: , Rfl:  .  omeprazole (PRILOSEC) 20 MG capsule, 1 capsule twice a day, Disp: 180 capsule, Rfl: 3 .  Probiotic Product (ALIGN) 4 MG CAPS, Take 4 mg by mouth daily as needed. , Disp: , Rfl:  .  warfarin (COUMADIN) 5 MG tablet, TAKE 1 TO 1 AND 1/2 TABLETS DAILY AS DIRECTED, Disp: 135 tablet, Rfl: 1  Allergies  Allergen Reactions  . Doxycycline Diarrhea  . Contrast Media [Iodinated Diagnostic Agents]     Syncope  . Lunesta [Eszopiclone]     "felt WILD"  . Morphine And Related Nausea And Vomiting  . Trazodone And Nefazodone Other (See Comments)    Extremely drowsy the next day after taking.    Past Medical History:  Diagnosis Date  . Anxiety   . Atrial fibrillation ([place])   . CAD, ARTERY BYPASS GRAFT 05/19/2008   Qualifier: Diagnosis of  By: Mare Ferrari, [place], Sherri    . Enteritis due to Clostridium difficile, history of 11/21/2012  . GERD (gastroesophageal reflux disease)   . Hyperlipidemia   . Hypertension   . 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 Vitamin D deficiency     Past Surgical History:  Procedure Laterality Date  . CARDIOVERSION N/A 05/05/2016   Procedure: CARDIOVERSION;  Surgeon: Danny Hector, MD;  Location: [place];  Service: Cardiovascular;  Laterality: N/A;  . CAROTID ENDARTERECTOMY Left 2003  . CAROTID-SUBCLAVIAN BYPASS GRAFT Right   . COLONOSCOPY  2005   hyperplastic polyp x 1 with no adenoma   . COLONOSCOPY N/A 04/11/2014   [id number] diverticulosis in the sigmoid colon/left colon is redundant  . CORONARY ARTERY BYPASS GRAFT    . ESOPHAGEAL DILATION N/A 04/11/2014   Procedure: ESOPHAGEAL DILATION;  Surgeon: [name], MD;  [facility];  Service: Endoscopy;  Laterality: N/A;  . ESOPHAGOGASTRODUODENOSCOPY N/A 04/11/2014   [id number] at the gastro junction/moderate erosive/duodenal web  . EYE SURGERY Bilateral    cataracts  . SKIN CANCER EXCISION      Family History  Problem Relation Age of Onset  . Heart disease Mother   . Stroke Mother   . Stroke Father   . Diabetes Son   . Stroke Son   . Other Sister        ruptured appendix   . Lung cancer Brother        asbestos  . Mental illness Sister   . Kidney disease Sister   . Colon cancer Neg Hx        "not that I know of."    Social History   Socioeconomic History  . Marital status: Married    Spouse name: ruby  . Number of children: 1  . Years of education:  Not on file  . Highest education level: Not on file  Occupational History  . Occupation: retired    Fish farm manager: [employer]  Tobacco Use  . Smoking status: Former Smoker    Types: Cigarettes    Start date: 01/24/1950    Quit date: 01/24/1961    Years since quitting: 58.1  . Smokeless tobacco: Never Used  Substance and Sexual Activity  . Alcohol use: Yes    Alcohol/week: 0.0 standard drinks    Comment: not drank since 12/2009  . Drug use: No  . Sexual activity: Yes    Partners: Female  Other Topics Concern  . Not on file  Social History Narrative  . Not on file   Social Determinants of Health   Financial Resource Strain:   . Difficulty of Paying Living Expenses: Not on file  Food Insecurity:   . Worried About Charity fundraiser in the Last Year: Not on file  . Ran Out of Food in the Last Year: Not on file  Transportation Needs:   . Lack of Transportation (Medical): Not on file  . Lack of Transportation (Non-Medical): Not on file   Physical Activity:   . Days of Exercise per Week: Not on file  . Minutes of Exercise per Session: Not on file  Stress:   . Feeling of Stress : Not on file  Social Connections:   . Frequency of Communication with Friends and Family: Not on file  . Frequency of Social Gatherings with Friends and Family: Not on file  . Attends Religious Services: Not on file  . Active Member of Clubs or Organizations: Not on file  . Attends Archivist Meetings: Not on file  . Marital Status: Not on file  Intimate Partner Violence:   . Fear of Current or Ex-Partner: Not on file  . Emotionally Abused: Not on file  . Physically Abused: Not on file  . Sexually Abused: Not on file     Objective:    BP 128/74   Pulse 88   Temp (!) 96.6 F (35.9 C) (Temporal)   Ht 6\' 2"  (1.88 m)   Wt 176 lb 6.4 oz (80 kg)   SpO2 95%   BMI 22.65 kg/m   Wt Readings from Last 3 Encounters:  02/26/19 176 lb 6.4 oz (80 kg)  02/01/19 175 lb (79.4 kg)  01/29/19 175 lb 12.8 oz (79.7 kg)    Physical Exam Vitals reviewed.  Constitutional:      General: He is not in acute distress.    Appearance: Normal appearance. He is normal weight. He is not ill-appearing, toxic-appearing or diaphoretic.  HENT:     Head: Normocephalic and atraumatic.     Right Ear: Tympanic membrane, ear canal and external ear normal. There is no impacted cerumen.     Left Ear: Tympanic membrane, ear canal and external ear normal. There is no impacted cerumen.     Nose: Nose normal. No congestion or rhinorrhea.     Mouth/Throat:     Mouth: Mucous membranes are moist.     Pharynx: Oropharynx is clear. No oropharyngeal exudate or posterior oropharyngeal erythema.  Eyes:     General: No scleral icterus.       Right eye: No discharge.        Left eye: No discharge.     Conjunctiva/sclera: Conjunctivae normal.     Pupils: Pupils are equal, round, and reactive to light.  Neck:     Vascular: No carotid bruit.  Cardiovascular:     Rate  and Rhythm: Normal rate. Rhythm irregularly irregular.     Heart sounds: Normal heart sounds. No murmur. No friction rub. No gallop.   Pulmonary:     Effort: Pulmonary effort is normal. No respiratory distress.     Breath sounds: Normal breath sounds. No stridor. No wheezing, rhonchi or rales.  Abdominal:     General: Abdomen is flat. Bowel sounds are normal. There is no distension.     Palpations: Abdomen is soft. There is no mass.     Tenderness: There is no abdominal tenderness. There is no guarding or rebound.     Hernia: No hernia is present.  Musculoskeletal:        General: Normal range of motion.     Cervical back: Normal range of motion and neck supple. No rigidity. No muscular tenderness.     Right lower leg: No edema.     Left lower leg: No edema.  Lymphadenopathy:     Cervical: No cervical adenopathy.  Skin:    General: Skin is warm and dry.     Capillary Refill: Capillary refill takes less than 2 seconds.  Neurological:     General: No focal deficit present.     Mental Status: He is alert and oriented to person, place, and time. Mental status is at baseline.  Psychiatric:        Mood and Affect: Mood normal.        Behavior: Behavior normal.        Thought Content: Thought content normal.        Judgment: Judgment normal.     Lab Results  Component Value Date   TSH 1.520 01/29/2018   Lab Results  Component Value Date   WBC 6.8 02/20/2019   HGB 15.2 02/20/2019   HCT 45.5 02/20/2019   MCV 95 02/20/2019   PLT 239 02/20/2019   Lab Results  Component Value Date   NA 136 02/20/2019   K 4.2 02/20/2019   CO2 26 02/20/2019   GLUCOSE 94 02/20/2019   BUN 10 02/20/2019   CREATININE 0.85 02/20/2019   BILITOT 0.7 02/20/2019   ALKPHOS 106 02/20/2019   AST 20 02/20/2019   ALT 16 02/20/2019   PROT 6.7 02/20/2019   ALBUMIN 4.3 02/20/2019   CALCIUM 9.2 02/20/2019   ANIONGAP 8 05/05/2016   Lab Results  Component Value Date   CHOL 170 02/20/2019   Lab Results   Component Value Date   HDL 87 02/20/2019   Lab Results  Component Value Date   LDLCALC 71 02/20/2019   Lab Results  Component Value Date   TRIG 60 02/20/2019   Lab Results  Component Value Date   CHOLHDL 2.0 02/20/2019   No results found for: HGBA1C

## 2019-02-25 ENCOUNTER — Other Ambulatory Visit: Payer: Self-pay

## 2019-02-26 ENCOUNTER — Encounter: Payer: Self-pay | Admitting: Family Medicine

## 2019-02-26 ENCOUNTER — Ambulatory Visit (INDEPENDENT_AMBULATORY_CARE_PROVIDER_SITE_OTHER): Payer: Medicare HMO | Admitting: Family Medicine

## 2019-02-26 VITALS — BP 128/74 | HR 88 | Temp 96.6°F | Ht 74.0 in | Wt 176.4 lb

## 2019-02-26 DIAGNOSIS — K219 Gastro-esophageal reflux disease without esophagitis: Secondary | ICD-10-CM

## 2019-02-26 DIAGNOSIS — E7800 Pure hypercholesterolemia, unspecified: Secondary | ICD-10-CM

## 2019-02-26 DIAGNOSIS — I700 Atherosclerosis of aorta: Secondary | ICD-10-CM

## 2019-02-26 DIAGNOSIS — Z0000 Encounter for general adult medical examination without abnormal findings: Secondary | ICD-10-CM

## 2019-02-26 DIAGNOSIS — I10 Essential (primary) hypertension: Secondary | ICD-10-CM

## 2019-02-26 DIAGNOSIS — E559 Vitamin D deficiency, unspecified: Secondary | ICD-10-CM | POA: Diagnosis not present

## 2019-02-26 DIAGNOSIS — Z7901 Long term (current) use of anticoagulants: Secondary | ICD-10-CM | POA: Diagnosis not present

## 2019-02-26 DIAGNOSIS — I480 Paroxysmal atrial fibrillation: Secondary | ICD-10-CM | POA: Diagnosis not present

## 2019-02-26 LAB — COAGUCHEK XS/INR WAIVED
INR: 2.2 — ABNORMAL HIGH (ref 0.9–1.1)
Prothrombin Time: 26.8 s

## 2019-02-26 MED ORDER — LOVASTATIN 40 MG PO TABS: 40.0000 mg | ORAL_TABLET | Freq: Every day | ORAL | 3 refills | Status: DC

## 2019-02-26 MED ORDER — OMEPRAZOLE 20 MG PO CPDR: DELAYED_RELEASE_CAPSULE | ORAL | 3 refills | Status: DC

## 2019-02-26 MED ORDER — LOSARTAN POTASSIUM 50 MG PO TABS: 50.0000 mg | ORAL_TABLET | Freq: Every morning | ORAL | 3 refills | Status: DC

## 2019-02-28 ENCOUNTER — Ambulatory Visit: Payer: Medicare HMO | Attending: Internal Medicine

## 2019-02-28 DIAGNOSIS — Z23 Encounter for immunization: Secondary | ICD-10-CM

## 2019-02-28 NOTE — Progress Notes (Signed)
   [id number]  Name:  Danny [name].    MRN: [id number] DOB: 1931/10/17  02/28/2019  Mr. Danny [name] was observed post Covid-19 immunization for 15 minutes without incidence. He was provided with Vaccine Information Sheet and instruction to access the V-Safe system.   Mr. Danny [name] was instructed to call 911 with any severe reactions post vaccine: [name] Difficulty breathing  . Swelling of your face and throat  . A fast heartbeat  . A bad rash all over your body  . Dizziness and weakness    Immunizations Administered    Name Date Dose VIS Date Route   Moderna COVID-19 Vaccine 02/28/2019 12:56 PM 0.5 mL 12/25/2018 Intramuscular   Manufacturer: Moderna   Lot: [id number]   [id number]

## 2019-03-19 ENCOUNTER — Ambulatory Visit: Payer: Medicare HMO | Admitting: Family Medicine

## 2019-03-25 ENCOUNTER — Other Ambulatory Visit: Payer: Self-pay

## 2019-03-25 NOTE — Progress Notes (Signed)
Assessment & Plan:  1-2. Paroxysmal atrial fibrillation (HCC)/Long term (current) use of anticoagulants [Z79.01] Description   Take an extra 1/2 tablet today, then continue taking 1 tablet every day except Wednesdays take 1/2 tablet.  INR today 1.9 (goal is 2-3). Next INR in 2 weeks.    - CoaguChek XS/INR Waived   Return in about 2 weeks (around 04/09/2019) for INR.  [name], MSN, APRN, FNP-C [facility]  Subjective:    Patient ID: Danny Eagan., male    DOB: [birth year], 86 y.o.   MRN: [id number]  Patient Care Team: Loman Brooklyn, FNP as PCP - General (Family Medicine) Josue Hector, MD as Attending Physician (Cardiology) [name], MD as Consulting Physician (Gastroenterology) Okey Regal, OD (Optometry) Rexene Alberts, MD as Consulting Physician (Cardiothoracic Surgery) Ilean China, RN as Registered Nurse Allyn Kenner, MD as Consulting Physician (Dermatology)   Chief Complaint:  Chief Complaint  Patient presents with  . Coagulation Disorder    HPI: Danny Toppi. is a 86 y.o. male presenting on 03/26/2019 for Coagulation Disorder  Anticoagulation: Patient here for anticoagulation monitoring. Indication:atrial fibrillation Bleeding Signs/Symptoms:None Thromboembolic Signs/Symptoms:None  Missed Coumadin Doses:None Medication Changes:None Dietary Changes:None Bacterial/Viral Infection:None  New complaints: None  Social history:  Relevant past medical, surgical, family and social history reviewed and updated as indicated. Interim medical history since our last visit reviewed.  Allergies and medications reviewed and updated.  DATA REVIEWED: CHART IN EPIC  ROS: Negative unless specifically indicated above in HPI.    Current Outpatient Medications:  .  Cholecalciferol (VITAMIN D-3 PO), Take 2,000 mg by mouth every morning. , Disp: , Rfl:  .  losartan (COZAAR) 50 MG tablet, Take 1 tablet (50 mg  total) by mouth every morning., Disp: 90 tablet, Rfl: 3 .  lovastatin (MEVACOR) 40 MG tablet, Take 1 tablet (40 mg total) by mouth at bedtime., Disp: 90 tablet, Rfl: 3 .  metoprolol tartrate (LOPRESSOR) 25 MG tablet, Take 1 tablet (25 mg total) by mouth 2 (two) times daily., Disp: 180 tablet, Rfl: 2 .  Multiple Vitamin (MULTIVITAMIN) tablet, Take 1 tablet by mouth every morning. , Disp: , Rfl:  .  nitroGLYCERIN (NITROSTAT) 0.4 MG SL tablet, PLACE 1 TABLET (0.4 MG TOTAL) UNDER THE TONGUE EVERY 5 (FIVE) MINUTES AS NEEDED FOR CHEST PAIN., Disp: 25 tablet, Rfl: 3 .  Omega-3 Fatty Acids (FISH OIL) 1000 MG CAPS, Take 2 capsules by mouth every morning. , Disp: , Rfl:  .  omeprazole (PRILOSEC) 20 MG capsule, 1 capsule twice a day, Disp: 180 capsule, Rfl: 3 .  Probiotic Product (ALIGN) 4 MG CAPS, Take 4 mg by mouth daily as needed. , Disp: , Rfl:  .  warfarin (COUMADIN) 5 MG tablet, TAKE 1 TO 1 AND 1/2 TABLETS DAILY AS DIRECTED, Disp: 135 tablet, Rfl: 1   Allergies  Allergen Reactions  . Doxycycline Diarrhea  . Contrast Media [Iodinated Diagnostic Agents]     Syncope  . Lunesta [Eszopiclone]     "felt WILD"  . Morphine And Related Nausea And Vomiting  . Trazodone And Nefazodone Other (See Comments)    Extremely drowsy the next day after taking.   Past Medical History:  Diagnosis Date  . Anxiety   . Atrial fibrillation ([place])   . CAD, ARTERY BYPASS GRAFT 05/19/2008   Qualifier: Diagnosis of  By: Mare Ferrari, [place], Sherri    . Enteritis due to Clostridium difficile, history of 11/21/2012  . GERD (gastroesophageal  reflux disease)   . Hyperlipidemia   . Hypertension   . 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 Vitamin D deficiency     Past Surgical History:  Procedure Laterality Date  . CARDIOVERSION N/A 05/05/2016   Procedure: CARDIOVERSION;  Surgeon: Josue Hector, MD;  Location: [place];  Service: Cardiovascular;  Laterality: N/A;  . CAROTID ENDARTERECTOMY Left 2003   . CAROTID-SUBCLAVIAN BYPASS GRAFT Right   . COLONOSCOPY  2005   hyperplastic polyp x 1 with no adenoma  . COLONOSCOPY N/A 04/11/2014   [id number] diverticulosis in the sigmoid colon/left colon is redundant  . CORONARY ARTERY BYPASS GRAFT    . ESOPHAGEAL DILATION N/A 04/11/2014   Procedure: ESOPHAGEAL DILATION;  Surgeon: [name], MD;  [facility];  Service: Endoscopy;  Laterality: N/A;  . ESOPHAGOGASTRODUODENOSCOPY N/A 04/11/2014   [id number] at the gastro junction/moderate erosive/duodenal web  . EYE SURGERY Bilateral    cataracts  . SKIN CANCER EXCISION      Social History   Socioeconomic History  . Marital status: Married    Spouse name: ruby  . Number of children: 1  . Years of education: Not on file  . Highest education level: Not on file  Occupational History  . Occupation: retired    Fish farm manager: [employer]  Tobacco Use  . Smoking status: Former Smoker    Types: Cigarettes    Start date: 01/24/1950    Quit date: 01/24/1961    Years since quitting: 58.2  . Smokeless tobacco: Never Used  Substance and Sexual Activity  . Alcohol use: Yes    Alcohol/week: 0.0 standard drinks    Comment: not drank since 12/2009  . Drug use: No  . Sexual activity: Yes    Partners: Female  Other Topics Concern  . Not on file  Social History Narrative  . Not on file   Social Determinants of Health   Financial Resource Strain:   . Difficulty of Paying Living Expenses: Not on file  Food Insecurity:   . Worried About Charity fundraiser in the Last Year: Not on file  . Ran Out of Food in the Last Year: Not on file  Transportation Needs:   . Lack of Transportation (Medical): Not on file  . Lack of Transportation (Non-Medical): Not on file  Physical Activity:   . Days of Exercise per Week: Not on file  . Minutes of Exercise per Session: Not on file  Stress:   . Feeling of Stress : Not on file  Social Connections:   . Frequency of Communication with Friends and Family: Not  on file  . Frequency of Social Gatherings with Friends and Family: Not on file  . Attends Religious Services: Not on file  . Active Member of Clubs or Organizations: Not on file  . Attends Archivist Meetings: Not on file  . Marital Status: Not on file  Intimate Partner Violence:   . Fear of Current or Ex-Partner: Not on file  . Emotionally Abused: Not on file  . Physically Abused: Not on file  . Sexually Abused: Not on file        Objective:    BP (!) 157/94   Pulse 90   Temp (!) 96 F (35.6 C) (Temporal)   Ht 6\' 2"  (1.88 m)   Wt 174 lb 3.2 oz (79 kg)   SpO2 99%   BMI 22.37 kg/m   Physical Exam Vitals reviewed.  Constitutional:  General: He is not in acute distress.    Appearance: Normal appearance. He is not ill-appearing, toxic-appearing or diaphoretic.  HENT:     Head: Normocephalic and atraumatic.  Eyes:     General: No scleral icterus.       Right eye: No discharge.        Left eye: No discharge.     Conjunctiva/sclera: Conjunctivae normal.  Cardiovascular:     Rate and Rhythm: Normal rate and regular rhythm.     Heart sounds: Murmur present. Systolic murmur present with a grade of 3/6. No friction rub. No gallop.   Pulmonary:     Effort: Pulmonary effort is normal. No respiratory distress.     Breath sounds: Normal breath sounds. No stridor. No wheezing, rhonchi or rales.  Musculoskeletal:        General: Normal range of motion.     Cervical back: Normal range of motion.  Skin:    General: Skin is warm and dry.  Neurological:     Mental Status: He is alert and oriented to person, place, and time. Mental status is at baseline.  Psychiatric:        Mood and Affect: Mood normal.        Behavior: Behavior normal.        Thought Content: Thought content normal.        Judgment: Judgment normal.    Lab Results  Component Value Date   TSH 1.520 01/29/2018   Lab Results  Component Value Date   WBC 6.8 02/20/2019   HGB 15.2 02/20/2019    HCT 45.5 02/20/2019   MCV 95 02/20/2019   PLT 239 02/20/2019   Lab Results  Component Value Date   NA 136 02/20/2019   K 4.2 02/20/2019   CO2 26 02/20/2019   GLUCOSE 94 02/20/2019   BUN 10 02/20/2019   CREATININE 0.85 02/20/2019   BILITOT 0.7 02/20/2019   ALKPHOS 106 02/20/2019   AST 20 02/20/2019   ALT 16 02/20/2019   PROT 6.7 02/20/2019   ALBUMIN 4.3 02/20/2019   CALCIUM 9.2 02/20/2019   ANIONGAP 8 05/05/2016   Lab Results  Component Value Date   CHOL 170 02/20/2019   Lab Results  Component Value Date   HDL 87 02/20/2019   Lab Results  Component Value Date   LDLCALC 71 02/20/2019   Lab Results  Component Value Date   TRIG 60 02/20/2019   Lab Results  Component Value Date   CHOLHDL 2.0 02/20/2019   No results found for: HGBA1C

## 2019-03-26 ENCOUNTER — Encounter: Payer: Self-pay | Admitting: Family Medicine

## 2019-03-26 ENCOUNTER — Ambulatory Visit (INDEPENDENT_AMBULATORY_CARE_PROVIDER_SITE_OTHER): Payer: Medicare HMO | Admitting: Family Medicine

## 2019-03-26 VITALS — BP 157/94 | HR 90 | Temp 96.0°F | Ht 74.0 in | Wt 174.2 lb

## 2019-03-26 DIAGNOSIS — Z7901 Long term (current) use of anticoagulants: Secondary | ICD-10-CM | POA: Diagnosis not present

## 2019-03-26 DIAGNOSIS — I480 Paroxysmal atrial fibrillation: Secondary | ICD-10-CM

## 2019-03-26 LAB — COAGUCHEK XS/INR WAIVED
INR: 1.9 — ABNORMAL HIGH (ref 0.9–1.1)
Prothrombin Time: 22.5 s

## 2019-03-26 MED ORDER — METOPROLOL TARTRATE 25 MG PO TABS: 25.0000 mg | ORAL_TABLET | Freq: Two times a day (BID) | ORAL | 2 refills | Status: DC

## 2019-04-01 ENCOUNTER — Ambulatory Visit: Payer: Medicare HMO | Attending: Internal Medicine

## 2019-04-01 DIAGNOSIS — Z23 Encounter for immunization: Secondary | ICD-10-CM

## 2019-04-01 NOTE — Progress Notes (Signed)
   [id number]  Name:  Danny [name].    MRN: [id number] DOB: 10-05-1931  04/01/2019  [name] was observed post Covid-19 immunization for 15 minutes without incident. He was provided with Vaccine Information Sheet and instruction to access the V-Safe system.   [name] was instructed to call 911 with any severe reactions post vaccine: [name] Difficulty breathing  . Swelling of face and throat  . A fast heartbeat  . A bad rash all over body  . Dizziness and weakness   Immunizations Administered    Name Date Dose VIS Date Route   Moderna COVID-19 Vaccine 04/01/2019 12:50 PM 0.5 mL 12/25/2018 Intramuscular   Manufacturer: Moderna   Lot: [id number]   [id number]

## 2019-04-08 ENCOUNTER — Telehealth: Payer: Self-pay | Admitting: Family Medicine

## 2019-04-08 NOTE — Progress Notes (Signed)
Assessment & Plan:  1-2. Paroxysmal atrial fibrillation (HCC)/Long term (current) use of anticoagulants [Z79.01] Description   Increase to 1 tablet every day.  INR today 2.0 (goal is 2-3). Next INR in 4 weeks.    - CoaguChek XS/INR Waived   Follow up plan: Return in about 4 weeks (around 05/07/2019) for INR.  [name], MSN, APRN, FNP-C [facility]  Subjective:   Patient ID: Danny Sidberry., male    DOB: Jul 17, 1931, 86 y.o.   MRN: [id number]  HPI: Danny Nutall. is a 86 y.o. male presenting on 04/09/2019 for Coagulation Disorder  Anticoagulation: Patient here for anticoagulation monitoring. Indication:atrial fibrillation Bleeding Signs/Symptoms:None Thromboembolic Signs/Symptoms:None  Missed Coumadin Doses:None Medication Changes:Patient was seen on 03/26/2019 his INR was 1.9, at that time he was advised to take an extra half a tablet that day and then continue 1 tablet every day except Wednesdays when he takes half a tablet Dietary Changes:None Bacterial/Viral Infection:None   ROS: Negative unless specifically indicated above in HPI.   Relevant past medical history reviewed and updated as indicated.   Allergies and medications reviewed and updated.   Current Outpatient Medications:  .  Cholecalciferol (VITAMIN D-3 PO), Take 2,000 mg by mouth every morning. , Disp: , Rfl:  .  losartan (COZAAR) 50 MG tablet, Take 1 tablet (50 mg total) by mouth every morning., Disp: 90 tablet, Rfl: 3 .  lovastatin (MEVACOR) 40 MG tablet, Take 1 tablet (40 mg total) by mouth at bedtime., Disp: 90 tablet, Rfl: 3 .  metoprolol tartrate (LOPRESSOR) 25 MG tablet, Take 1 tablet (25 mg total) by mouth 2 (two) times daily., Disp: 180 tablet, Rfl: 2 .  Multiple Vitamin (MULTIVITAMIN) tablet, Take 1 tablet by mouth every morning. , Disp: , Rfl:  .  nitroGLYCERIN (NITROSTAT) 0.4 MG SL tablet, PLACE 1 TABLET (0.4 MG TOTAL) UNDER THE TONGUE EVERY 5 (FIVE)  MINUTES AS NEEDED FOR CHEST PAIN., Disp: 25 tablet, Rfl: 3 .  Omega-3 Fatty Acids (FISH OIL) 1000 MG CAPS, Take 2 capsules by mouth every morning. , Disp: , Rfl:  .  omeprazole (PRILOSEC) 20 MG capsule, 1 capsule twice a day, Disp: 180 capsule, Rfl: 3 .  Probiotic Product (ALIGN) 4 MG CAPS, Take 4 mg by mouth daily as needed. , Disp: , Rfl:  .  warfarin (COUMADIN) 5 MG tablet, TAKE 1 TO 1 AND 1/2 TABLETS DAILY AS DIRECTED, Disp: 135 tablet, Rfl: 1  Allergies  Allergen Reactions  . Doxycycline Diarrhea  . Contrast Media [Iodinated Diagnostic Agents]     Syncope  . Lunesta [Eszopiclone]     "felt WILD"  . Morphine And Related Nausea And Vomiting  . Trazodone And Nefazodone Other (See Comments)    Extremely drowsy the next day after taking.    Objective:   BP 119/73   Pulse 86   Temp (!) 97.3 F (36.3 C) (Temporal)   Ht 6\' 2"  (1.88 m)   Wt 172 lb 9.6 oz (78.3 kg)   SpO2 99%   BMI 22.16 kg/m    Physical Exam Vitals reviewed.  Constitutional:      General: He is not in acute distress.    Appearance: Normal appearance. He is normal weight. He is not ill-appearing, toxic-appearing or diaphoretic.  HENT:     Head: Normocephalic and atraumatic.  Eyes:     General: No scleral icterus.       Right eye: No discharge.  Left eye: No discharge.     Conjunctiva/sclera: Conjunctivae normal.  Cardiovascular:     Rate and Rhythm: Normal rate and regular rhythm.     Heart sounds: Murmur present. Systolic murmur present with a grade of 3/6. No friction rub. No gallop.   Pulmonary:     Effort: Pulmonary effort is normal. No respiratory distress.     Breath sounds: Normal breath sounds. No stridor. No wheezing, rhonchi or rales.  Musculoskeletal:        General: Normal range of motion.     Cervical back: Normal range of motion.  Skin:    General: Skin is warm and dry.  Neurological:     Mental Status: He is alert and oriented to person, place, and time. Mental status is at  baseline.  Psychiatric:        Mood and Affect: Mood normal.        Behavior: Behavior normal.        Thought Content: Thought content normal.        Judgment: Judgment normal.

## 2019-04-08 NOTE — Chronic Care Management (AMB) (Signed)
  Chronic Care Management   Note  04/08/2019 Name: Danny [name]. MRN: [id number] DOB: 1931-05-25  Danny [name]. is a 85 y.o. year old male who is a primary care patient of Loman Brooklyn, FNP. I reached out to Danny [name]. by phone today in response to a referral sent by [name]'s health plan.     Mr. Schnabel was given information about Chronic Care Management services today including:  1. CCM service includes personalized support from designated clinical staff supervised by his physician, including individualized plan of care and coordination with other care providers 2. 24/7 contact phone numbers for assistance for urgent and routine care needs. 3. Service will only be billed when office clinical staff spend 20 minutes or more in a month to coordinate care. 4. Only one practitioner may furnish and bill the service in a calendar month. 5. The patient may stop CCM services at any time (effective at the end of the month) by phone call to the office staff. 6. The patient will be responsible for cost sharing (co-pay) of up to 20% of the service fee (after annual deductible is met).  Patient did not agree to enrollment in care management services and does not wish to consider at this time.  Follow up plan: The patient has been provided with contact information for the care management team and has been advised to call with any health related questions or concerns.   [place], [place] [address] Direct Dial: [phone] [email or web address]

## 2019-04-08 NOTE — Chronic Care Management (AMB) (Signed)
  Chronic Care Management   Outreach Note  04/08/2019 Name: Jayvaughn Knippenberg. MRN: [id number] DOB: 18-Jul-1931  [name]. is a 86 y.o. year old male who is a primary care patient of Loman Brooklyn, FNP. I reached out to [name]. by phone today in response to a referral sent by Mr. JHONATHON ROSENBOOM Jr.'s health plan.     An unsuccessful telephone outreach was attempted today. The patient was referred to the case management team for assistance with care management and care coordination.   Follow Up Plan: A HIPPA compliant phone message was left for the patient providing contact information and requesting a return call. The care management team will reach out to the patient again over the next 7 days.  If patient returns call to provider office, please advise to call [facility] at [phone].  [place], [place] [address] Direct Dial: [phone] [email or web address]

## 2019-04-09 ENCOUNTER — Ambulatory Visit (INDEPENDENT_AMBULATORY_CARE_PROVIDER_SITE_OTHER): Payer: Medicare HMO | Admitting: Family Medicine

## 2019-04-09 ENCOUNTER — Encounter: Payer: Self-pay | Admitting: Family Medicine

## 2019-04-09 ENCOUNTER — Other Ambulatory Visit: Payer: Self-pay

## 2019-04-09 VITALS — BP 119/73 | HR 86 | Temp 97.3°F | Ht 74.0 in | Wt 172.6 lb

## 2019-04-09 DIAGNOSIS — Z7901 Long term (current) use of anticoagulants: Secondary | ICD-10-CM | POA: Diagnosis not present

## 2019-04-09 DIAGNOSIS — I480 Paroxysmal atrial fibrillation: Secondary | ICD-10-CM | POA: Diagnosis not present

## 2019-04-09 LAB — COAGUCHEK XS/INR WAIVED
INR: 2 — ABNORMAL HIGH (ref 0.9–1.1)
Prothrombin Time: 24.2 s

## 2019-04-09 MED ORDER — WARFARIN SODIUM 5 MG PO TABS: 5.0000 mg | ORAL_TABLET | Freq: Every day | ORAL | 2 refills | Status: DC

## 2019-04-12 ENCOUNTER — Ambulatory Visit: Payer: Medicare HMO | Admitting: Family Medicine

## 2019-05-07 ENCOUNTER — Ambulatory Visit (INDEPENDENT_AMBULATORY_CARE_PROVIDER_SITE_OTHER): Payer: Medicare HMO | Admitting: Family Medicine

## 2019-05-07 ENCOUNTER — Other Ambulatory Visit: Payer: Self-pay

## 2019-05-07 ENCOUNTER — Encounter: Payer: Self-pay | Admitting: Family Medicine

## 2019-05-07 DIAGNOSIS — I480 Paroxysmal atrial fibrillation: Secondary | ICD-10-CM

## 2019-05-07 DIAGNOSIS — Z7901 Long term (current) use of anticoagulants: Secondary | ICD-10-CM

## 2019-05-07 LAB — COAGUCHEK XS/INR WAIVED
INR: 3 — ABNORMAL HIGH (ref 0.9–1.1)
Prothrombin Time: 35.7 s

## 2019-05-07 NOTE — Progress Notes (Signed)
Assessment & Plan:  1-2. Paroxysmal atrial fibrillation (HCC)/Long term (current) use of anticoagulants [Z79.01] Description   Continue 1 tablet every day.  INR today 3.0 (goal is 2-3). Next INR in 6 weeks.    - CoaguChek XS/INR Waived   Return in about 6 weeks (around 06/18/2019) for INR.  [name], MSN, APRN, FNP-C [facility]  Subjective:    Patient ID: Danny [name]., male    DOB: 31-Mar-1931, 86 y.o.   MRN: [id number]  Patient Care Team: Loman Brooklyn, FNP as PCP - General (Family Medicine) Josue Hector, MD as Attending Physician (Cardiology) [name], MD as Consulting Physician (Gastroenterology) Okey Regal, OD (Optometry) Rexene Alberts, MD as Consulting Physician (Cardiothoracic Surgery) Ilean China, RN as Registered Nurse Allyn Kenner, MD as Consulting Physician (Dermatology)   Chief Complaint:  Chief Complaint  Patient presents with  . Coagulation Disorder    HPI: Danny [name]. is a 86 y.o. male presenting on 05/07/2019 for Coagulation Disorder  Anticoagulation: Patient here for anticoagulation monitoring. Indication:atrial fibrillation Bleeding Signs/Symptoms:None Thromboembolic Signs/Symptoms:None  Missed Coumadin Doses:None Medication Changes:Patient was seen on 04/09/2019 at which time he was advised to increase coumadin to 1 tablet every day as his INR has been staying so close to 2.0 Dietary Changes:None Bacterial/Viral Infection:None  New complaints: None  Social history:  Relevant past medical, surgical, family and social history reviewed and updated as indicated. Interim medical history since our last visit reviewed.  Allergies and medications reviewed and updated.  DATA REVIEWED: CHART IN EPIC  ROS: Negative unless specifically indicated above in HPI.    Current Outpatient Medications:  .  Cholecalciferol (VITAMIN D-3 PO), Take 2,000 mg by mouth every morning. , Disp: ,  Rfl:  .  losartan (COZAAR) 50 MG tablet, Take 1 tablet (50 mg total) by mouth every morning., Disp: 90 tablet, Rfl: 3 .  lovastatin (MEVACOR) 40 MG tablet, Take 1 tablet (40 mg total) by mouth at bedtime., Disp: 90 tablet, Rfl: 3 .  metoprolol tartrate (LOPRESSOR) 25 MG tablet, Take 1 tablet (25 mg total) by mouth 2 (two) times daily., Disp: 180 tablet, Rfl: 2 .  Multiple Vitamin (MULTIVITAMIN) tablet, Take 1 tablet by mouth every morning. , Disp: , Rfl:  .  nitroGLYCERIN (NITROSTAT) 0.4 MG SL tablet, PLACE 1 TABLET (0.4 MG TOTAL) UNDER THE TONGUE EVERY 5 (FIVE) MINUTES AS NEEDED FOR CHEST PAIN., Disp: 25 tablet, Rfl: 3 .  Omega-3 Fatty Acids (FISH OIL) 1000 MG CAPS, Take 2 capsules by mouth every morning. , Disp: , Rfl:  .  omeprazole (PRILOSEC) 20 MG capsule, 1 capsule twice a day, Disp: 180 capsule, Rfl: 3 .  Probiotic Product (ALIGN) 4 MG CAPS, Take 4 mg by mouth daily as needed. , Disp: , Rfl:  .  warfarin (COUMADIN) 5 MG tablet, Take 1 tablet (5 mg total) by mouth daily., Disp: 90 tablet, Rfl: 2   Allergies  Allergen Reactions  . Doxycycline Diarrhea  . Contrast Media [Iodinated Diagnostic Agents]     Syncope  . Lunesta [Eszopiclone]     "felt WILD"  . Morphine And Related Nausea And Vomiting  . Trazodone And Nefazodone Other (See Comments)    Extremely drowsy the next day after taking.   Past Medical History:  Diagnosis Date  . Anxiety   . Atrial fibrillation ([place])   . CAD, ARTERY BYPASS GRAFT 05/19/2008   Qualifier: Diagnosis of  By: Mare Ferrari, [place], Sherri    .  Enteritis due to Clostridium difficile, history of 11/21/2012  . GERD (gastroesophageal reflux disease)   . Hyperlipidemia   . Hypertension   . 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 Vitamin D deficiency     Past Surgical History:  Procedure Laterality Date  . CARDIOVERSION N/A 05/05/2016   Procedure: CARDIOVERSION;  Surgeon: Josue Hector, MD;  Location: [place];  Service:  Cardiovascular;  Laterality: N/A;  . CAROTID ENDARTERECTOMY Left 2003  . CAROTID-SUBCLAVIAN BYPASS GRAFT Right   . COLONOSCOPY  2005   hyperplastic polyp x 1 with no adenoma  . COLONOSCOPY N/A 04/11/2014   [id number] diverticulosis in the sigmoid colon/left colon is redundant  . CORONARY ARTERY BYPASS GRAFT    . ESOPHAGEAL DILATION N/A 04/11/2014   Procedure: ESOPHAGEAL DILATION;  Surgeon: [name], MD;  [facility];  Service: Endoscopy;  Laterality: N/A;  . ESOPHAGOGASTRODUODENOSCOPY N/A 04/11/2014   [id number] at the gastro junction/moderate erosive/duodenal web  . EYE SURGERY Bilateral    cataracts  . SKIN CANCER EXCISION      Social History   Socioeconomic History  . Marital status: Married    Spouse name: Danny [name]  . Number of children: 1  . Years of education: Not on file  . Highest education level: Not on file  Occupational History  . Occupation: retired    Fish farm manager: [employer]  Tobacco Use  . Smoking status: Former Smoker    Types: Cigarettes    Start date: 01/24/1950    Quit date: 01/24/1961    Years since quitting: 58.3  . Smokeless tobacco: Never Used  Substance and Sexual Activity  . Alcohol use: Yes    Alcohol/week: 0.0 standard drinks    Comment: not drank since 12/2009  . Drug use: No  . Sexual activity: Yes    Partners: Female  Other Topics Concern  . Not on file  Social History Narrative  . Not on file   Social Determinants of Health   Financial Resource Strain:   . Difficulty of Paying Living Expenses:   Food Insecurity:   . Worried About Charity fundraiser in the Last Year:   . Arboriculturist in the Last Year:   Transportation Needs:   . Film/video editor (Medical):   [name] Lack of Transportation (Non-Medical):   Physical Activity:   . Days of Exercise per Week:   . Minutes of Exercise per Session:   Stress:   . Feeling of Stress :   Social Connections:   . Frequency of Communication with Friends and Family:   . Frequency of  Social Gatherings with Friends and Family:   . Attends Religious Services:   . Active Member of Clubs or Organizations:   . Attends Archivist Meetings:   [name] Marital Status:   Intimate Partner Violence:   . Fear of Current or Ex-Partner:   . Emotionally Abused:   [name] Physically Abused:   . Sexually Abused:         Objective:    BP 118/73   Pulse 81   Temp (!) 97.2 F (36.2 C) (Temporal)   Ht 6\' 2"  (1.88 m)   Wt 175 lb (79.4 kg)   SpO2 100%   BMI 22.47 kg/m   Wt Readings from Last 3 Encounters:  05/07/19 175 lb (79.4 kg)  04/09/19 172 lb 9.6 oz (78.3 kg)  03/26/19 174 lb 3.2 oz (79 kg)    Physical Exam Vitals  reviewed.  Constitutional:      General: He is not in acute distress.    Appearance: Normal appearance. He is normal weight. He is not ill-appearing, toxic-appearing or diaphoretic.  HENT:     Head: Normocephalic and atraumatic.  Eyes:     General: No scleral icterus.       Right eye: No discharge.        Left eye: No discharge.     Conjunctiva/sclera: Conjunctivae normal.  Cardiovascular:     Rate and Rhythm: Normal rate.  Pulmonary:     Effort: Pulmonary effort is normal. No respiratory distress.  Musculoskeletal:        General: Normal range of motion.     Cervical back: Normal range of motion.  Skin:    General: Skin is warm and dry.  Neurological:     Mental Status: He is alert and oriented to person, place, and time. Mental status is at baseline.  Psychiatric:        Mood and Affect: Mood normal.        Behavior: Behavior normal.        Thought Content: Thought content normal.        Judgment: Judgment normal.     Lab Results  Component Value Date   TSH 1.520 01/29/2018   Lab Results  Component Value Date   WBC 6.8 02/20/2019   HGB 15.2 02/20/2019   HCT 45.5 02/20/2019   MCV 95 02/20/2019   PLT 239 02/20/2019   Lab Results  Component Value Date   NA 136 02/20/2019   K 4.2 02/20/2019   CO2 26 02/20/2019   GLUCOSE 94  02/20/2019   BUN 10 02/20/2019   CREATININE 0.85 02/20/2019   BILITOT 0.7 02/20/2019   ALKPHOS 106 02/20/2019   AST 20 02/20/2019   ALT 16 02/20/2019   PROT 6.7 02/20/2019   ALBUMIN 4.3 02/20/2019   CALCIUM 9.2 02/20/2019   ANIONGAP 8 05/05/2016   Lab Results  Component Value Date   CHOL 170 02/20/2019   Lab Results  Component Value Date   HDL 87 02/20/2019   Lab Results  Component Value Date   LDLCALC 71 02/20/2019   Lab Results  Component Value Date   TRIG 60 02/20/2019   Lab Results  Component Value Date   CHOLHDL 2.0 02/20/2019   No results found for: HGBA1C

## 2019-06-19 ENCOUNTER — Encounter: Payer: Self-pay | Admitting: Family Medicine

## 2019-06-19 ENCOUNTER — Ambulatory Visit (INDEPENDENT_AMBULATORY_CARE_PROVIDER_SITE_OTHER): Payer: Medicare HMO | Admitting: Family Medicine

## 2019-06-19 ENCOUNTER — Other Ambulatory Visit: Payer: Self-pay

## 2019-06-19 DIAGNOSIS — I480 Paroxysmal atrial fibrillation: Secondary | ICD-10-CM | POA: Diagnosis not present

## 2019-06-19 DIAGNOSIS — Z7901 Long term (current) use of anticoagulants: Secondary | ICD-10-CM | POA: Diagnosis not present

## 2019-06-19 LAB — COAGUCHEK XS/INR WAIVED
INR: 2.8 — ABNORMAL HIGH (ref 0.9–1.1)
Prothrombin Time: 33.3 s

## 2019-06-19 NOTE — Progress Notes (Signed)
Assessment & Plan:  1-2. Paroxysmal atrial fibrillation (HCC)/Long term (current) use of anticoagulants [Z79.01] Description   Continue 1 tablet every day.  INR today 2.8 (goal is 2-3). Next INR in 6 weeks.    - CoaguChek XS/INR Waived   Return in about 6 weeks (around 07/31/2019) for INR.  [name], MSN, APRN, FNP-C [facility]  Subjective:    Patient ID: Danny [name]., male    DOB: [birth year], 86 y.o.   MRN: [id number]  Patient Care Team: Loman Brooklyn, FNP as PCP - General (Family Medicine) Josue Hector, MD as Attending Physician (Cardiology) [name], MD (Inactive) as Consulting Physician (Gastroenterology) Okey Regal, OD (Optometry) Rexene Alberts, MD as Consulting Physician (Cardiothoracic Surgery) Ilean China, RN as Registered Nurse Allyn Kenner, MD as Consulting Physician (Dermatology)   Chief Complaint:  Chief Complaint  Patient presents with  . Coagulation Disorder    HPI: Pace Luth. is a 86 y.o. male presenting on 06/19/2019 for Coagulation Disorder  Anticoagulation: Patient here for anticoagulation monitoring. Indication: atrial fibrillation Bleeding Signs/Symptoms:  None Thromboembolic Signs/Symptoms:  None  Missed Coumadin Doses:  None Medication Changes:  no Dietary Changes:  no Bacterial/Viral Infection:  no   New complaints: None  Social history:  Relevant past medical, surgical, family and social history reviewed and updated as indicated. Interim medical history since our last visit reviewed.  Allergies and medications reviewed and updated.  DATA REVIEWED: CHART IN EPIC  ROS: Negative unless specifically indicated above in HPI.    Current Outpatient Medications:  .  Cholecalciferol (VITAMIN D-3 PO), Take 2,000 mg by mouth every morning. , Disp: , Rfl:  .  losartan (COZAAR) 50 MG tablet, Take 1 tablet (50 mg total) by mouth every morning., Disp: 90 tablet, Rfl: 3 .  lovastatin  (MEVACOR) 40 MG tablet, Take 1 tablet (40 mg total) by mouth at bedtime., Disp: 90 tablet, Rfl: 3 .  metoprolol tartrate (LOPRESSOR) 25 MG tablet, Take 1 tablet (25 mg total) by mouth 2 (two) times daily., Disp: 180 tablet, Rfl: 2 .  Multiple Vitamin (MULTIVITAMIN) tablet, Take 1 tablet by mouth every morning. , Disp: , Rfl:  .  Omega-3 Fatty Acids (FISH OIL) 1000 MG CAPS, Take 2 capsules by mouth every morning. , Disp: , Rfl:  .  omeprazole (PRILOSEC) 20 MG capsule, 1 capsule twice a day, Disp: 180 capsule, Rfl: 3 .  Probiotic Product (ALIGN) 4 MG CAPS, Take 4 mg by mouth daily as needed. , Disp: , Rfl:  .  warfarin (COUMADIN) 5 MG tablet, Take 1 tablet (5 mg total) by mouth daily., Disp: 90 tablet, Rfl: 2 .  nitroGLYCERIN (NITROSTAT) 0.4 MG SL tablet, PLACE 1 TABLET (0.4 MG TOTAL) UNDER THE TONGUE EVERY 5 (FIVE) MINUTES AS NEEDED FOR CHEST PAIN. (Patient not taking: Reported on 06/19/2019), Disp: 25 tablet, Rfl: 3   Allergies  Allergen Reactions  . Doxycycline Diarrhea  . Contrast Media [Iodinated Diagnostic Agents]     Syncope  . Lunesta [Eszopiclone]     "felt WILD"  . Morphine And Related Nausea And Vomiting  . Trazodone And Nefazodone Other (See Comments)    Extremely drowsy the next day after taking.   Past Medical History:  Diagnosis Date  . Anxiety   . Atrial fibrillation ([place])   . CAD, ARTERY BYPASS GRAFT 05/19/2008   Qualifier: Diagnosis of  By: Mare Ferrari, [place], Sherri    . Enteritis due to Clostridium difficile,  history of 11/21/2012  . GERD (gastroesophageal reflux disease)   . Hyperlipidemia   . Hypertension   . 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 Vitamin D deficiency     Past Surgical History:  Procedure Laterality Date  . CARDIOVERSION N/A 05/05/2016   Procedure: CARDIOVERSION;  Surgeon: Josue Hector, MD;  Location: [facility];  Service: Cardiovascular;  Laterality: N/A;  . CAROTID ENDARTERECTOMY Left 2003  . CAROTID-SUBCLAVIAN BYPASS  GRAFT Right   . COLONOSCOPY  2005   hyperplastic polyp x 1 with no adenoma  . COLONOSCOPY N/A 04/11/2014   [id number] diverticulosis in the sigmoid colon/left colon is redundant  . CORONARY ARTERY BYPASS GRAFT    . ESOPHAGEAL DILATION N/A 04/11/2014   Procedure: ESOPHAGEAL DILATION;  Surgeon: [name], MD;  [facility];  Service: Endoscopy;  Laterality: N/A;  . ESOPHAGOGASTRODUODENOSCOPY N/A 04/11/2014   [id number] at the gastro junction/moderate erosive/duodenal web  . EYE SURGERY Bilateral    cataracts  . SKIN CANCER EXCISION      Social History   Socioeconomic History  . Marital status: Married    Spouse name: ruby  . Number of children: 1  . Years of education: Not on file  . Highest education level: Not on file  Occupational History  . Occupation: retired    Fish farm manager: [employer]  Tobacco Use  . Smoking status: Former Smoker    Types: Cigarettes    Start date: 01/24/1950    Quit date: 01/24/1961    Years since quitting: 58.4  . Smokeless tobacco: Never Used  Substance and Sexual Activity  . Alcohol use: Yes    Alcohol/week: 0.0 standard drinks    Comment: not drank since 12/2009  . Drug use: No  . Sexual activity: Yes    Partners: Female  Other Topics Concern  . Not on file  Social History Narrative  . Not on file   Social Determinants of Health   Financial Resource Strain:   . Difficulty of Paying Living Expenses:   Food Insecurity:   . Worried About Charity fundraiser in the Last Year:   . Arboriculturist in the Last Year:   Transportation Needs:   . Film/video editor (Medical):   [name] Lack of Transportation (Non-Medical):   Physical Activity:   . Days of Exercise per Week:   . Minutes of Exercise per Session:   Stress:   . Feeling of Stress :   Social Connections:   . Frequency of Communication with Friends and Family:   . Frequency of Social Gatherings with Friends and Family:   . Attends Religious Services:   . Active Member of Clubs  or Organizations:   . Attends Archivist Meetings:   [name] Marital Status:   Intimate Partner Violence:   . Fear of Current or Ex-Partner:   . Emotionally Abused:   [name] Physically Abused:   . Sexually Abused:         Objective:    BP 132/70   Pulse 63   Temp (!) 97.2 F (36.2 C) (Temporal)   Ht 6\' 2"  (1.88 m)   Wt 172 lb 6.4 oz (78.2 kg)   BMI 22.13 kg/m   Wt Readings from Last 3 Encounters:  06/19/19 172 lb 6.4 oz (78.2 kg)  05/07/19 175 lb (79.4 kg)  04/09/19 172 lb 9.6 oz (78.3 kg)    Physical Exam Vitals reviewed.  Constitutional:  General: He is not in acute distress.    Appearance: Normal appearance. He is normal weight. He is not ill-appearing, toxic-appearing or diaphoretic.  HENT:     Head: Normocephalic and atraumatic.  Eyes:     General: No scleral icterus.       Right eye: No discharge.        Left eye: No discharge.     Conjunctiva/sclera: Conjunctivae normal.  Cardiovascular:     Rate and Rhythm: Normal rate and regular rhythm.     Heart sounds: Murmur present. Systolic murmur present with a grade of 3/6. No friction rub. No gallop.   Pulmonary:     Effort: Pulmonary effort is normal. No respiratory distress.     Breath sounds: Normal breath sounds. No stridor. No wheezing, rhonchi or rales.  Musculoskeletal:        General: Normal range of motion.     Cervical back: Normal range of motion.  Skin:    General: Skin is warm and dry.  Neurological:     Mental Status: He is alert and oriented to person, place, and time. Mental status is at baseline.  Psychiatric:        Mood and Affect: Mood normal.        Behavior: Behavior normal.        Thought Content: Thought content normal.        Judgment: Judgment normal.     Lab Results  Component Value Date   TSH 1.520 01/29/2018   Lab Results  Component Value Date   WBC 6.8 02/20/2019   HGB 15.2 02/20/2019   HCT 45.5 02/20/2019   MCV 95 02/20/2019   PLT 239 02/20/2019   Lab Results   Component Value Date   NA 136 02/20/2019   K 4.2 02/20/2019   CO2 26 02/20/2019   GLUCOSE 94 02/20/2019   BUN 10 02/20/2019   CREATININE 0.85 02/20/2019   BILITOT 0.7 02/20/2019   ALKPHOS 106 02/20/2019   AST 20 02/20/2019   ALT 16 02/20/2019   PROT 6.7 02/20/2019   ALBUMIN 4.3 02/20/2019   CALCIUM 9.2 02/20/2019   ANIONGAP 8 05/05/2016   Lab Results  Component Value Date   CHOL 170 02/20/2019   Lab Results  Component Value Date   HDL 87 02/20/2019   Lab Results  Component Value Date   LDLCALC 71 02/20/2019   Lab Results  Component Value Date   TRIG 60 02/20/2019   Lab Results  Component Value Date   CHOLHDL 2.0 02/20/2019   No results found for: HGBA1C

## 2019-08-02 ENCOUNTER — Encounter: Payer: Self-pay | Admitting: Family Medicine

## 2019-08-02 ENCOUNTER — Ambulatory Visit (INDEPENDENT_AMBULATORY_CARE_PROVIDER_SITE_OTHER): Payer: Medicare HMO | Admitting: Family Medicine

## 2019-08-02 ENCOUNTER — Other Ambulatory Visit: Payer: Self-pay

## 2019-08-02 VITALS — BP 124/76 | HR 101 | Temp 97.2°F | Ht 74.0 in | Wt 172.4 lb

## 2019-08-02 DIAGNOSIS — Z7901 Long term (current) use of anticoagulants: Secondary | ICD-10-CM | POA: Diagnosis not present

## 2019-08-02 DIAGNOSIS — S80811A Abrasion, right lower leg, initial encounter: Secondary | ICD-10-CM

## 2019-08-02 DIAGNOSIS — I480 Paroxysmal atrial fibrillation: Secondary | ICD-10-CM

## 2019-08-02 DIAGNOSIS — M7989 Other specified soft tissue disorders: Secondary | ICD-10-CM

## 2019-08-02 LAB — COAGUCHEK XS/INR WAIVED
INR: 3.2 — ABNORMAL HIGH (ref 0.9–1.1)
Prothrombin Time: 38.7 s

## 2019-08-02 NOTE — Progress Notes (Signed)
Assessment & Plan:  1-2. Long term (current) use of anticoagulants/Paroxysmal atrial fibrillation ([place]) Description   Skip today's dose of Coumadin. Then resume 1 tablet every day.  INR today 3.2 (goal is 2-3). Next INR in 2 weeks.    - CoaguChek XS/INR Waived - Patient has been on current dosage since March and has had INRs WNL. Previously was on 5 mg daily except 2.5 mg on Wednesday which was not enough (kept having subtherapeutic INRs).   3. Swelling of right lower extremity - Encouraged elevation of RLE, heat, and compression hose to reduce swelling.   4. Abrasion of right lower extremity, initial encounter - No s/s of infection. Patient to keep clean and dry.    Return in about 2 weeks (around 08/16/2019) for INR.  [name], MSN, APRN, FNP-C [facility]  Subjective:    Patient ID: Danny Serpa., male    DOB: 11-04-1931, 86 y.o.   MRN: [id number]  Patient Care Team: Loman Brooklyn, FNP as PCP - General (Family Medicine) Josue Hector, MD as Attending Physician (Cardiology) [name], MD (Inactive) as Consulting Physician (Gastroenterology) Okey Regal, OD (Optometry) Rexene Alberts, MD as Consulting Physician (Cardiothoracic Surgery) Ilean China, RN as Registered Nurse Allyn Kenner, MD as Consulting Physician (Dermatology)   Chief Complaint:  Chief Complaint  Patient presents with  . Coagulation Disorder  . Leg Swelling    Patient states that a week ago he hit his right lower leg and it is swelling.    HPI: Danny Saha. is a 86 y.o. male presenting on 08/02/2019 for Coagulation Disorder and Leg Swelling (Patient states that a week ago he hit his right lower leg and it is swelling.)  Anticoagulation: Patient here for anticoagulation monitoring. Indication: atrial fibrillation Bleeding Signs/Symptoms:  None Thromboembolic Signs/Symptoms:  None  Missed Coumadin Doses:  None Medication Changes:  no Dietary  Changes: Patient reports he has been eating a lot of meals dropped off by various people since the death of his son.  He does not recall eating anything green and leafy but does remember eating broccoli. Bacterial/Viral Infection:  no  New complaints: Patient c/o RLE swelling that started a week ago after he hit his leg on a concrete step.  He did sustain a very small abrasion to his chin which she has been keeping clean.   Social history:  Relevant past medical, surgical, family and social history reviewed and updated as indicated. Interim medical history since our last visit reviewed.  Allergies and medications reviewed and updated.  DATA REVIEWED: CHART IN EPIC  ROS: Negative unless specifically indicated above in HPI.    Current Outpatient Medications:  .  Cholecalciferol (VITAMIN D-3 PO), Take 2,000 mg by mouth every morning. , Disp: , Rfl:  .  losartan (COZAAR) 50 MG tablet, Take 1 tablet (50 mg total) by mouth every morning., Disp: 90 tablet, Rfl: 3 .  lovastatin (MEVACOR) 40 MG tablet, Take 1 tablet (40 mg total) by mouth at bedtime., Disp: 90 tablet, Rfl: 3 .  metoprolol tartrate (LOPRESSOR) 25 MG tablet, Take 1 tablet (25 mg total) by mouth 2 (two) times daily., Disp: 180 tablet, Rfl: 2 .  Multiple Vitamin (MULTIVITAMIN) tablet, Take 1 tablet by mouth every morning. , Disp: , Rfl:  .  nitroGLYCERIN (NITROSTAT) 0.4 MG SL tablet, PLACE 1 TABLET (0.4 MG TOTAL) UNDER THE TONGUE EVERY 5 (FIVE) MINUTES AS NEEDED FOR CHEST PAIN., Disp: 25 tablet, Rfl:  3 .  Omega-3 Fatty Acids (FISH OIL) 1000 MG CAPS, Take 2 capsules by mouth every morning. , Disp: , Rfl:  .  omeprazole (PRILOSEC) 20 MG capsule, 1 capsule twice a day, Disp: 180 capsule, Rfl: 3 .  Probiotic Product (ALIGN) 4 MG CAPS, Take 4 mg by mouth daily as needed. , Disp: , Rfl:  .  warfarin (COUMADIN) 5 MG tablet, Take 1 tablet (5 mg total) by mouth daily., Disp: 90 tablet, Rfl: 2   Allergies  Allergen Reactions  . Doxycycline  Diarrhea  . Contrast Media [Iodinated Diagnostic Agents]     Syncope  . Lunesta [Eszopiclone]     "felt WILD"  . Morphine And Related Nausea And Vomiting  . Trazodone And Nefazodone Other (See Comments)    Extremely drowsy the next day after taking.   Past Medical History:  Diagnosis Date  . Anxiety   . Atrial fibrillation ([place])   . CAD, ARTERY BYPASS GRAFT 05/19/2008   Qualifier: Diagnosis of  By: Mare Ferrari, [place], Sherri    . Enteritis due to Clostridium difficile, history of 11/21/2012  . GERD (gastroesophageal reflux disease)   . Hyperlipidemia   . Hypertension   . 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 Vitamin D deficiency     Past Surgical History:  Procedure Laterality Date  . CARDIOVERSION N/A 05/05/2016   Procedure: CARDIOVERSION;  Surgeon: Josue Hector, MD;  Location: [facility];  Service: Cardiovascular;  Laterality: N/A;  . CAROTID ENDARTERECTOMY Left 2003  . CAROTID-SUBCLAVIAN BYPASS GRAFT Right   . COLONOSCOPY  2005   hyperplastic polyp x 1 with no adenoma  . COLONOSCOPY N/A 04/11/2014   [id number] diverticulosis in the sigmoid colon/left colon is redundant  . CORONARY ARTERY BYPASS GRAFT    . ESOPHAGEAL DILATION N/A 04/11/2014   Procedure: ESOPHAGEAL DILATION;  Surgeon: [name], MD;  [facility];  Service: Endoscopy;  Laterality: N/A;  . ESOPHAGOGASTRODUODENOSCOPY N/A 04/11/2014   [id number] at the gastro junction/moderate erosive/duodenal web  . EYE SURGERY Bilateral    cataracts  . SKIN CANCER EXCISION      Social History   Socioeconomic History  . Marital status: Married    Spouse name: ruby  . Number of children: 1  . Years of education: Not on file  . Highest education level: Not on file  Occupational History  . Occupation: retired    Fish farm manager: [employer]  Tobacco Use  . Smoking status: Former Smoker    Types: Cigarettes    Start date: 01/24/1950    Quit date: 01/24/1961    Years since quitting: 58.5  .  Smokeless tobacco: Never Used  Vaping Use  . Vaping Use: Never used  Substance and Sexual Activity  . Alcohol use: Yes    Alcohol/week: 0.0 standard drinks    Comment: not drank since 12/2009  . Drug use: No  . Sexual activity: Yes    Partners: Female  Other Topics Concern  . Not on file  Social History Narrative  . Not on file   Social Determinants of Health   Financial Resource Strain:   . Difficulty of Paying Living Expenses:   Food Insecurity:   . Worried About Charity fundraiser in the Last Year:   . Arboriculturist in the Last Year:   Transportation Needs:   . Film/video editor (Medical):   [name] Lack of Transportation (Non-Medical):   Physical Activity:   . Days of  Exercise per Week:   . Minutes of Exercise per Session:   Stress:   . Feeling of Stress :   Social Connections:   . Frequency of Communication with Friends and Family:   . Frequency of Social Gatherings with Friends and Family:   . Attends Religious Services:   . Active Member of Clubs or Organizations:   . Attends Archivist Meetings:   [name] Marital Status:   Intimate Partner Violence:   . Fear of Current or Ex-Partner:   . Emotionally Abused:   [name] Physically Abused:   . Sexually Abused:         Objective:    BP 124/76   Pulse (!) 101   Temp (!) 97.2 F (36.2 C) (Temporal)   Ht 6\' 2"  (1.88 m)   Wt 172 lb 6.4 oz (78.2 kg)   SpO2 92%   BMI 22.13 kg/m   Wt Readings from Last 3 Encounters:  08/02/19 172 lb 6.4 oz (78.2 kg)  06/19/19 172 lb 6.4 oz (78.2 kg)  05/07/19 175 lb (79.4 kg)    Physical Exam Vitals reviewed.  Constitutional:      General: He is not in acute distress.    Appearance: Normal appearance. He is not ill-appearing, toxic-appearing or diaphoretic.  HENT:     Head: Normocephalic and atraumatic.  Eyes:     General: No scleral icterus.       Right eye: No discharge.        Left eye: No discharge.     Conjunctiva/sclera: Conjunctivae normal.   Cardiovascular:     Rate and Rhythm: Normal rate.  Pulmonary:     Effort: Pulmonary effort is normal. No respiratory distress.  Musculoskeletal:        General: Normal range of motion.     Cervical back: Normal range of motion.     Right lower leg: Edema present.  Skin:    General: Skin is warm and dry.     Findings: Abrasion (right shin with very minimal erythema; no warmth, drainage, or odor.) and ecchymosis (that has ran down his right leg to his foot) present.  Neurological:     Mental Status: He is alert and oriented to person, place, and time. Mental status is at baseline.  Psychiatric:        Mood and Affect: Mood normal.        Behavior: Behavior normal.        Thought Content: Thought content normal.        Judgment: Judgment normal.     Lab Results  Component Value Date   TSH 1.520 01/29/2018   Lab Results  Component Value Date   WBC 6.8 02/20/2019   HGB 15.2 02/20/2019   HCT 45.5 02/20/2019   MCV 95 02/20/2019   PLT 239 02/20/2019   Lab Results  Component Value Date   NA 136 02/20/2019   K 4.2 02/20/2019   CO2 26 02/20/2019   GLUCOSE 94 02/20/2019   BUN 10 02/20/2019   CREATININE 0.85 02/20/2019   BILITOT 0.7 02/20/2019   ALKPHOS 106 02/20/2019   AST 20 02/20/2019   ALT 16 02/20/2019   PROT 6.7 02/20/2019   ALBUMIN 4.3 02/20/2019   CALCIUM 9.2 02/20/2019   ANIONGAP 8 05/05/2016   Lab Results  Component Value Date   CHOL 170 02/20/2019   Lab Results  Component Value Date   HDL 87 02/20/2019   Lab Results  Component Value Date   LDLCALC 71  02/20/2019   Lab Results  Component Value Date   TRIG 60 02/20/2019   Lab Results  Component Value Date   CHOLHDL 2.0 02/20/2019   No results found for: HGBA1C

## 2019-08-02 NOTE — Patient Instructions (Addendum)
Compression hose. Heat. Elevation.

## 2019-09-28 ENCOUNTER — Other Ambulatory Visit: Payer: Self-pay | Admitting: Family Medicine

## 2019-10-01 ENCOUNTER — Other Ambulatory Visit: Payer: Self-pay

## 2019-10-01 ENCOUNTER — Encounter: Payer: Self-pay | Admitting: Family Medicine

## 2019-10-01 ENCOUNTER — Ambulatory Visit (INDEPENDENT_AMBULATORY_CARE_PROVIDER_SITE_OTHER): Payer: Medicare HMO | Admitting: Family Medicine

## 2019-10-01 VITALS — BP 133/78 | HR 72 | Temp 96.8°F | Ht 74.0 in | Wt 173.0 lb

## 2019-10-01 DIAGNOSIS — Z7901 Long term (current) use of anticoagulants: Secondary | ICD-10-CM

## 2019-10-01 DIAGNOSIS — I480 Paroxysmal atrial fibrillation: Secondary | ICD-10-CM

## 2019-10-01 LAB — COAGUCHEK XS/INR WAIVED
INR: 2.6 — ABNORMAL HIGH (ref 0.9–1.1)
Prothrombin Time: 30.6 s

## 2019-10-01 NOTE — Progress Notes (Signed)
Assessment & Plan:  1-2. Long term (current) use of anticoagulants/Paroxysmal atrial fibrillation [facility]) Description   INR today 2.6 (goal is 2-3).  Continue 5 mg once daily. Next INR in 6 weeks.    - CoaguChek XS/INR Waived   Return in about 6 weeks (around 11/12/2019) for follow-up of chronic medication conditions.  [name], MSN, APRN, FNP-C [facility]  Subjective:    Patient ID: Danny Brannen., male    DOB: 1931/10/26, 86 y.o.   MRN: [id number]  Patient Care Team: Loman Brooklyn, FNP as PCP - General (Family Medicine) Josue Hector, MD as Attending Physician (Cardiology) [name], MD (Inactive) as Consulting Physician (Gastroenterology) Okey Regal, OD (Optometry) Rexene Alberts, MD as Consulting Physician (Cardiothoracic Surgery) Ilean China, RN as Registered Nurse Allyn Kenner, MD as Consulting Physician (Dermatology)   Chief Complaint:  Chief Complaint  Patient presents with  . Coagulation Disorder    HPI: Danny Morrical. is a 86 y.o. male presenting on 10/01/2019 for Coagulation Disorder  Anticoagulation: Patient here for anticoagulation monitoring. Indication: atrial fibrillation Bleeding Signs/Symptoms:  None Thromboembolic Signs/Symptoms:  None  Missed Coumadin Doses:  None Medication Changes:  Not since he skipped one dose with last INR  Dietary Changes:  no Bacterial/Viral Infection:  no  New complaints: None  Social history:  Relevant past medical, surgical, family and social history reviewed and updated as indicated. Interim medical history since our last visit reviewed.  Allergies and medications reviewed and updated.  DATA REVIEWED: CHART IN EPIC  ROS: Negative unless specifically indicated above in HPI.    Current Outpatient Medications:  .  Cholecalciferol (VITAMIN D-3 PO), Take 2,000 mg by mouth every morning. , Disp: , Rfl:  .  losartan (COZAAR) 50 MG tablet, Take 1 tablet (50 mg  total) by mouth every morning., Disp: 90 tablet, Rfl: 3 .  lovastatin (MEVACOR) 40 MG tablet, Take 1 tablet (40 mg total) by mouth at bedtime., Disp: 90 tablet, Rfl: 3 .  metoprolol tartrate (LOPRESSOR) 25 MG tablet, Take 1 tablet (25 mg total) by mouth 2 (two) times daily., Disp: 180 tablet, Rfl: 2 .  Multiple Vitamin (MULTIVITAMIN) tablet, Take 1 tablet by mouth every morning. , Disp: , Rfl:  .  nitroGLYCERIN (NITROSTAT) 0.4 MG SL tablet, PLACE 1 TABLET (0.4 MG TOTAL) UNDER THE TONGUE EVERY 5 (FIVE) MINUTES AS NEEDED FOR CHEST PAIN., Disp: 25 tablet, Rfl: 3 .  Omega-3 Fatty Acids (FISH OIL) 1000 MG CAPS, Take 2 capsules by mouth every morning. , Disp: , Rfl:  .  omeprazole (PRILOSEC) 20 MG capsule, 1 capsule twice a day, Disp: 180 capsule, Rfl: 3 .  Probiotic Product (ALIGN) 4 MG CAPS, Take 4 mg by mouth daily as needed. , Disp: , Rfl:  .  warfarin (COUMADIN) 5 MG tablet, Take 1 tablet (5 mg total) by mouth daily., Disp: 90 tablet, Rfl: 2   Allergies  Allergen Reactions  . Doxycycline Diarrhea  . Contrast Media [Iodinated Diagnostic Agents]     Syncope  . Lunesta [Eszopiclone]     "felt WILD"  . Morphine And Related Nausea And Vomiting  . Trazodone And Nefazodone Other (See Comments)    Extremely drowsy the next day after taking.   Past Medical History:  Diagnosis Date  . Anxiety   . Atrial fibrillation ([place])   . CAD, ARTERY BYPASS GRAFT 05/19/2008   Qualifier: Diagnosis of  By: Mare Ferrari, [place], Sherri    .  Enteritis due to Clostridium difficile, history of 11/21/2012  . GERD (gastroesophageal reflux disease)   . Hyperlipidemia   . Hypertension   . 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 Vitamin D deficiency     Past Surgical History:  Procedure Laterality Date  . CARDIOVERSION N/A 05/05/2016   Procedure: CARDIOVERSION;  Surgeon: Josue Hector, MD;  Location: [place];  Service: Cardiovascular;  Laterality: N/A;  . CAROTID ENDARTERECTOMY Left 2003  .  CAROTID-SUBCLAVIAN BYPASS GRAFT Right   . COLONOSCOPY  2005   hyperplastic polyp x 1 with no adenoma  . COLONOSCOPY N/A 04/11/2014   JME:QAST diverticulosis in the sigmoid colon/left colon is redundant  . CORONARY ARTERY BYPASS GRAFT    . ESOPHAGEAL DILATION N/A 04/11/2014   Procedure: ESOPHAGEAL DILATION;  Surgeon: [name], MD;  [facility];  Service: Endoscopy;  Laterality: N/A;  . ESOPHAGOGASTRODUODENOSCOPY N/A 04/11/2014   [id number] at the gastro junction/moderate erosive/duodenal web  . EYE SURGERY Bilateral    cataracts  . SKIN CANCER EXCISION      Social History   Socioeconomic History  . Marital status: Married    Spouse name: ruby  . Number of children: 1  . Years of education: Not on file  . Highest education level: Not on file  Occupational History  . Occupation: retired    Fish farm manager: [employer]  Tobacco Use  . Smoking status: Former Smoker    Types: Cigarettes    Start date: 01/24/1950    Quit date: 01/24/1961    Years since quitting: 58.7  . Smokeless tobacco: Never Used  Vaping Use  . Vaping Use: Never used  Substance and Sexual Activity  . Alcohol use: Yes    Alcohol/week: 0.0 standard drinks    Comment: not drank since 12/2009  . Drug use: No  . Sexual activity: Yes    Partners: Female  Other Topics Concern  . Not on file  Social History Narrative  . Not on file   Social Determinants of Health   Financial Resource Strain:   . Difficulty of Paying Living Expenses: Not on file  Food Insecurity:   . Worried About Charity fundraiser in the Last Year: Not on file  . Ran Out of Food in the Last Year: Not on file  Transportation Needs:   . Lack of Transportation (Medical): Not on file  . Lack of Transportation (Non-Medical): Not on file  Physical Activity:   . Days of Exercise per Week: Not on file  . Minutes of Exercise per Session: Not on file  Stress:   . Feeling of Stress : Not on file  Social Connections:   . Frequency of  Communication with Friends and Family: Not on file  . Frequency of Social Gatherings with Friends and Family: Not on file  . Attends Religious Services: Not on file  . Active Member of Clubs or Organizations: Not on file  . Attends Archivist Meetings: Not on file  . Marital Status: Not on file  Intimate Partner Violence:   . Fear of Current or Ex-Partner: Not on file  . Emotionally Abused: Not on file  . Physically Abused: Not on file  . Sexually Abused: Not on file        Objective:    BP 133/78   Pulse 72   Temp (!) 96.8 F (36 C) (Temporal)   Ht 6\' 2"  (1.88 m)   Wt 173 lb (78.5 kg)  SpO2 95%   BMI 22.21 kg/m   Wt Readings from Last 3 Encounters:  10/01/19 173 lb (78.5 kg)  08/02/19 172 lb 6.4 oz (78.2 kg)  06/19/19 172 lb 6.4 oz (78.2 kg)    Physical Exam Vitals reviewed.  Constitutional:      General: He is not in acute distress.    Appearance: Normal appearance. He is not ill-appearing, toxic-appearing or diaphoretic.  HENT:     Head: Normocephalic and atraumatic.  Eyes:     General: No scleral icterus.       Right eye: No discharge.        Left eye: No discharge.     Conjunctiva/sclera: Conjunctivae normal.  Cardiovascular:     Rate and Rhythm: Normal rate.  Pulmonary:     Effort: Pulmonary effort is normal. No respiratory distress.  Musculoskeletal:        General: Normal range of motion.     Cervical back: Normal range of motion.  Skin:    General: Skin is warm and dry.  Neurological:     Mental Status: He is alert and oriented to person, place, and time. Mental status is at baseline.  Psychiatric:        Mood and Affect: Mood normal.        Behavior: Behavior normal.        Thought Content: Thought content normal.        Judgment: Judgment normal.     Lab Results  Component Value Date   TSH 1.520 01/29/2018   Lab Results  Component Value Date   WBC 6.8 02/20/2019   HGB 15.2 02/20/2019   HCT 45.5 02/20/2019   MCV 95  02/20/2019   PLT 239 02/20/2019   Lab Results  Component Value Date   NA 136 02/20/2019   K 4.2 02/20/2019   CO2 26 02/20/2019   GLUCOSE 94 02/20/2019   BUN 10 02/20/2019   CREATININE 0.85 02/20/2019   BILITOT 0.7 02/20/2019   ALKPHOS 106 02/20/2019   AST 20 02/20/2019   ALT 16 02/20/2019   PROT 6.7 02/20/2019   ALBUMIN 4.3 02/20/2019   CALCIUM 9.2 02/20/2019   ANIONGAP 8 05/05/2016   Lab Results  Component Value Date   CHOL 170 02/20/2019   Lab Results  Component Value Date   HDL 87 02/20/2019   Lab Results  Component Value Date   LDLCALC 71 02/20/2019   Lab Results  Component Value Date   TRIG 60 02/20/2019   Lab Results  Component Value Date   CHOLHDL 2.0 02/20/2019   No results found for: HGBA1C

## 2019-10-02 ENCOUNTER — Other Ambulatory Visit: Payer: Self-pay | Admitting: Family Medicine

## 2019-10-02 DIAGNOSIS — I10 Essential (primary) hypertension: Secondary | ICD-10-CM

## 2019-10-10 NOTE — Progress Notes (Signed)
Cardiology Office Note   Date:  10/11/2019   ID:  Danny Loron., DOB 1931/09/23, MRN [id number]  PCP:  Loman Brooklyn, FNP Cardiologist:  [name], MD 02/01/2019 tele-visit Electrphysiologist: None Rosaria Ferries, PA-C   No chief complaint on file.   History of Present Illness: Danny Zagal. is a 86 y.o. male with a history of  PVD s/p L CEA w/ R subclavian bpg 2009 and PTCA 2010, CAD s/p CABG 2003 w/ LIMA-LAD, RIMA-RI, SVG-OM3, PAF on coumadin, HTN, HLD, and GERD  Danny [name]. presents for 6 month follow up  He is struggling, his son died in [place] 6 weeks ago. This is causing him a great deal of difficulty. He hopes to see his PCP soon for this. He was on Xanax 0.25 mg at bedtime, but that was stopped several years ago. He could use it now.   He gets a funny feeling in his abdomen, not really nausea or pain. This started after his son's death.  The feeling goes up into his chest.  It is not exertional.  He has not been eating well, no appetite.  He feels SOB when he walks any distance, also has been dealing w/ fatigue.   He does not feel palpitations, but will be aware that his heart rate is irregular when he checks his pulse. He thinks he goes in/out of Afib but cannot really tell the difference.   His wife goes to the Afib clinic, he is interested.  He wonders if he might feel better if he stayed in sinus rhythm.  He never gets chest pain.  However, his exertional level is very low.   COVID status: vaccinated, did not have COVID Past Medical History:  Diagnosis Date  . Anxiety   . Atrial fibrillation ([place])   . CAD, ARTERY BYPASS GRAFT 05/19/2008   Qualifier: Diagnosis of  By: Mare Ferrari, [place], Sherri    . Enteritis due to Clostridium difficile, history of 11/21/2012  . GERD (gastroesophageal reflux disease)   . Hyperlipidemia   . Hypertension   . 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 Vitamin D deficiency     Past Surgical  History:  Procedure Laterality Date  . CARDIOVERSION N/A 05/05/2016   Procedure: CARDIOVERSION;  Surgeon: Josue Hector, MD;  Location: [place];  Service: Cardiovascular;  Laterality: N/A;  . CAROTID ENDARTERECTOMY Left 2003  . CAROTID-SUBCLAVIAN BYPASS GRAFT Right   . COLONOSCOPY  2005   hyperplastic polyp x 1 with no adenoma  . COLONOSCOPY N/A 04/11/2014   PYP:PJKD diverticulosis in the sigmoid colon/left colon is redundant  . CORONARY ARTERY BYPASS GRAFT    . ESOPHAGEAL DILATION N/A 04/11/2014   Procedure: ESOPHAGEAL DILATION;  Surgeon: [name], MD;  [facility];  Service: Endoscopy;  Laterality: N/A;  . ESOPHAGOGASTRODUODENOSCOPY N/A 04/11/2014   [id number] at the gastro junction/moderate erosive/duodenal web  . EYE SURGERY Bilateral    cataracts  . SKIN CANCER EXCISION      Current Outpatient Medications  Medication Sig Dispense Refill  . Cholecalciferol (VITAMIN D-3 PO) Take 2,000 mg by mouth every morning.     [name] losartan (COZAAR) 50 MG tablet TAKE 1 TABLET EVERY MORNING 90 tablet 0  . lovastatin (MEVACOR) 40 MG tablet Take 1 tablet (40 mg total) by mouth at bedtime. 90 tablet 3  . metoprolol tartrate (LOPRESSOR) 25 MG tablet TAKE 1 TABLET TWICE DAILY 180 tablet 0  .  Multiple Vitamin (MULTIVITAMIN) tablet Take 1 tablet by mouth every morning.     . nitroGLYCERIN (NITROSTAT) 0.4 MG SL tablet PLACE 1 TABLET (0.4 MG TOTAL) UNDER THE TONGUE EVERY 5 (FIVE) MINUTES AS NEEDED FOR CHEST PAIN. 25 tablet 3  . Omega-3 Fatty Acids (FISH OIL) 1000 MG CAPS Take 2 capsules by mouth every morning.     [name] omeprazole (PRILOSEC) 20 MG capsule 1 capsule twice a day 180 capsule 3  . Probiotic Product (ALIGN) 4 MG CAPS Take 4 mg by mouth daily as needed.     . warfarin (COUMADIN) 5 MG tablet Take 1 tablet (5 mg total) by mouth daily. 90 tablet 2   No current facility-administered medications for this visit.    Allergies:   Doxycycline, Contrast media [iodinated diagnostic  agents], Lunesta [eszopiclone], Morphine and related, and Trazodone and nefazodone    Social History:  The patient  reports that he quit smoking about 58 years ago. His smoking use included cigarettes. He started smoking about 69 years ago. He has never used smokeless tobacco. He reports current alcohol use. He reports that he does not use drugs.   Family History:  The patient's family history includes Diabetes in his son; Heart disease in his mother; Kidney disease in his sister; Lung cancer in his brother; Mental illness in his sister; Other in his sister; Stroke in his father, mother, and son.  He indicated that his mother is deceased. He indicated that his father is deceased. He indicated that all of his three sisters are deceased. He indicated that his brother is deceased. He indicated that his maternal grandmother is deceased. He indicated that his maternal grandfather is deceased. He indicated that his paternal grandmother is deceased. He indicated that his paternal grandfather is deceased. He indicated that his son is alive. He indicated that the status of his neg hx is unknown.   ROS:  Please see the history of present illness. All other systems are reviewed and negative.    PHYSICAL EXAM: VS:  BP (!) 144/82   Pulse 94   Ht 6\' 2"  (1.88 m)   Wt 170 lb 9.6 oz (77.4 kg)   SpO2 99%   BMI 21.90 kg/m  , BMI Body mass index is 21.9 kg/m. GEN: Well nourished, well developed, male in no acute distress HEENT: normal for age  Neck: JVD 10 cm, R>L carotid bruit, subclavian bruits noted as well, no masses Cardiac: RRR; 2/6 murmur, no rubs, or gallops Respiratory:  clear to auscultation bilaterally, normal work of breathing GI: soft, nontender, nondistended, + BS MS: no deformity or atrophy; no edema; distal pulses are 2+ in all 4 extremities  Skin: warm and dry, no rash Neuro:  Strength and sensation are intact Psych: euthymic mood, full affect   EKG:  EKG is ordered today. The ekg  ordered today demonstrates Atrial fibrillation, HR 94, no acute ischemic changes  ECHO: 04/25/2016 - Left ventricle: The cavity size was normal. Wall thickness was  normal. Systolic function was normal. The estimated ejection  fraction was in the range of 50% to 55%. Wall motion was normal;  there were no regional wall motion abnormalities.  - Ventricular septum: Septal motion showed paradox. The contour  showed diastolic flattening. These changes are consistent with RV  volume overload.  - Mitral valve: There was mild regurgitation.  - Left atrium: The atrium was mildly dilated.  - Right ventricle: The cavity size was mildly to moderately  dilated. Systolic function was reduced.  -  Right atrium: The atrium was moderately dilated.   CATH: 07/25/2001 It became obvious as soon as we placed the Judkins left 4 catheter in the patients aortic root that there was severe damping to the left main coronary artery only limited injections could be done as the patient became significantly bradycardic and hypotensive.  Flush injections revealed a 95% left main stenosis.  We did take two injections of the left main with pullback on the catheter to reveal the distal vessels.  The LAD appeared to be patent with two diagonal branches of medium size.  The circumflex system appeared to consist mainly of one AV groove branch and an OM branch.  The OM branch had 50% multiple discrete lesions in it.  The distal left main also had a 50% tubular stenosis.  Imaging of the right coronary artery showed a 30% distal lesion.  Interestingly, there were right to left collaterals to the LAD which suggested the left main was critically tight requiring collateralization of the LAD.  RAO ventriculography revealed normal LV function with an EF of 60%.  There was no gradient across the aortic valve and no MR.  Aortic pressure was in the 180/70 range with LV pressure in the 180/28 range.  Left subclavian  and internal mammary artery projections shows it to be widely patent and a good vessel for bypass.  IMPRESSION:  The patient will be started on oxygen as well as heparin and nitroglycerin.  CVTS has been called and hopefully we can operate on the patient within the next 24 to 36 hours. Dictated by:   Wallis Bamberg Johnsie Cancel, M.D. [facility]  CAROTID AND SUBCLAVIAN DOPPLERS: 07/28/2017 Final Interpretation:  Right Carotid: Velocities in the right ICA are consistent with a 40-59%         stenosis. The ECA appears >50% stenosed.  Right Graft:(s) Patent right CCA to subclavian artery bypass graft, with  severely elevated velocities at the CCA anastomosis, and elevated  velocities throughout.    Left Carotid: Velocities in the left ICA are consistent with a 1-39%  stenosis.   Vertebrals: Bilateral vertebral arteries demonstrate antegrade flow.  Subclavians: Right subclavian artery flow was disturbed. Normal flow        hemodynamics were seen in the left subclavian artery.   SUBCLAVIAN Right: No significant arterial obstruction detected in the right     upper extremity.  Left: No significant arterial obstruction detected in the left upper    extremity.   MYOVIEW: 08/01/2006 STRESS MYOVIEW STUDY:  Patient exercised for 5 minutes on the standard Bruce protocol. Exercise was stopped somewhat abruptly due to acute shortness of breath. The patient had a millimeter and a half of ST segment depression in the inferior lateral leads. He had frequent PACs and PVCs. We managed to run the treadmill at a low level speed for another 30 seconds to make sure the Myoview circulated. His baseline EKG showed sinus rhythm with nonspecific ST-T wave changes and borderline LVH. There were flat ST segments in the inferior lateral leads; however, he did have an additional 2 mm of ST segment depression and T-wave inversions in the inferior lateral leads with stress.   Overall, the EKG was thought to  be nondiagnostic due to baseline changes.   The patient's baseline heart rate was 65. His blood pressure is 162/78 in the left arm. In the right arm, it was 109/72 consistent with his known subclavian disease.   With stress, the heart rate increased to a maximum of 133 and  blood pressure was 210/82.   SCINTIGRAPHIC RESULTS: Images were reconstructed in the short axis, vertical, and horizontal long axis. Resting and stress images were normal. There is no evidence of ischemia or infarction. The gated ejection fraction was 64%.  IMPRESSION:  1. Clinically abnormal stress test with acute onset of shortness of breath, abnormal EKG; however, scintigraphic images are low risk with no evidence of ischemia or infarction. The gated ejection fraction is 64%. The patient will be followed closely clinically. He is not having significant pain.   2. There was also no evidence of LV cavity dilatation with stress to suggest balanced ischemia.    Recent Labs: 02/20/2019: ALT 16; BUN 10; Creatinine, Ser 0.85; Hemoglobin 15.2; Platelets 239; Potassium 4.2; Sodium 136  CBC    Component Value Date/Time   WBC 6.8 02/20/2019 0815   WBC 6.6 04/25/2016 1303   RBC 4.79 02/20/2019 0815   RBC 4.76 04/25/2016 1303   HGB 15.2 02/20/2019 0815   HCT 45.5 02/20/2019 0815   PLT 239 02/20/2019 0815   MCV 95 02/20/2019 0815   MCH 31.7 02/20/2019 0815   MCH 31.3 04/25/2016 1303   MCHC 33.4 02/20/2019 0815   MCHC 34.3 04/25/2016 1303   RDW 12.9 02/20/2019 0815   LYMPHSABS 1.4 02/20/2019 0815   MONOABS 1.8 (H) 09/19/2012 1915   EOSABS 0.2 02/20/2019 0815   BASOSABS 0.1 02/20/2019 0815   CMP Latest Ref Rng & Units 02/20/2019 09/27/2018 05/30/2018  Glucose 65 - 99 mg/dL 94 108(H) 100(H)  BUN 8 - 27 mg/dL 10 13 10   Creatinine 0.76 - 1.27 mg/dL 0.85 1.01 0.88  Sodium 134 - 144 mmol/L 136 134 138  Potassium 3.5 - 5.2 mmol/L 4.2 4.1 4.3  Chloride 96 - 106 mmol/L 97 96 98  CO2 20 - 29 mmol/L 26 23 24   Calcium 8.6 - 10.2 mg/dL  9.2 9.3 9.5  Total Protein 6.0 - 8.5 g/dL 6.7 6.4 6.8  Total Bilirubin 0.0 - 1.2 mg/dL 0.7 0.8 0.7  Alkaline Phos 39 - 117 IU/L 106 91 95  AST 0 - 40 IU/L 20 21 21   ALT 0 - 44 IU/L 16 18 17    Lab Results  Component Value Date   INR 2.6 (H) 10/01/2019   INR 3.2 (H) 08/02/2019   INR 2.8 (H) 06/19/2019    Lipid Panel Lab Results  Component Value Date   CHOL 170 02/20/2019   HDL 87 02/20/2019   LDLCALC 71 02/20/2019   TRIG 60 02/20/2019   CHOLHDL 2.0 02/20/2019      Wt Readings from Last 3 Encounters:  10/11/19 170 lb 9.6 oz (77.4 kg)  10/01/19 173 lb (78.5 kg)  08/02/19 172 lb 6.4 oz (78.2 kg)     Other studies Reviewed: Additional studies/ records that were reviewed today include: Office notes, hospital records and testing.  ASSESSMENT AND PLAN:  1.  DOE:  -His weight is stable, he has no lower extremity edema and no significant rales on exam. -However, his neck veins are up. -Check an echocardiogram and follow-up on results.  His EF was previously normal.  2.  Persistent atrial fibrillation -His dyspnea on exertion may come from poorly controlled heart rate.  I will increase his metoprolol to 50 mg twice daily and see how this is tolerated. -If his blood pressure does not tolerate this, cut back on the losartan -Continue Coumadin, he is compliant w/ INR checks -He is interested in referral to the A. fib clinic, we will make this  happen. -He feels he may feel better in general if he is in sinus rhythm.  3.  Hypertension -His blood pressure is above target today, he feels that his blood pressure is generally above target.  His heart rate is 94 at rest -Continue losartan 50 mg daily and increase the metoprolol to 50 mg twice daily  4.  Carotid disease, history of subclavian disease -He had a left carotid endarterectomy in the past and has bilateral carotid bruits. -It has been 2 years since his last carotid Dopplers, recheck -He has no symptoms of subclavian steal  at this time   Current medicines are reviewed at length with the patient today.  The patient does not have concerns regarding medicines.  The following changes have been made:  Increase BB  Labs/ tests ordered today include:   Orders Placed This Encounter  Procedures  . EKG 12-Lead     Disposition:   FU with [name], MD  Signed, Rosaria Ferries, PA-C  10/11/2019 2:57 PM    [facility] Phone: [phone]; Fax: [phone]

## 2019-10-11 ENCOUNTER — Ambulatory Visit: Payer: Medicare HMO | Admitting: Physician Assistant

## 2019-10-11 ENCOUNTER — Other Ambulatory Visit: Payer: Self-pay

## 2019-10-11 ENCOUNTER — Encounter: Payer: Self-pay | Admitting: Physician Assistant

## 2019-10-11 VITALS — BP 144/82 | HR 94 | Ht 74.0 in | Wt 170.6 lb

## 2019-10-11 DIAGNOSIS — Z23 Encounter for immunization: Secondary | ICD-10-CM

## 2019-10-11 DIAGNOSIS — R0609 Other forms of dyspnea: Secondary | ICD-10-CM

## 2019-10-11 DIAGNOSIS — R0600 Dyspnea, unspecified: Secondary | ICD-10-CM | POA: Diagnosis not present

## 2019-10-11 DIAGNOSIS — I10 Essential (primary) hypertension: Secondary | ICD-10-CM

## 2019-10-11 DIAGNOSIS — I4819 Other persistent atrial fibrillation: Secondary | ICD-10-CM | POA: Diagnosis not present

## 2019-10-11 DIAGNOSIS — R0989 Other specified symptoms and signs involving the circulatory and respiratory systems: Secondary | ICD-10-CM

## 2019-10-11 MED ORDER — METOPROLOL TARTRATE 50 MG PO TABS: 50.0000 mg | ORAL_TABLET | Freq: Two times a day (BID) | ORAL | 3 refills | Status: DC

## 2019-10-11 NOTE — Patient Instructions (Signed)
Medication Instructions:  INCREASE Lopressor to 50 mg twice a day   *If you need a refill on your cardiac medications before your next appointment, please call your pharmacy*   Lab Work: None today If you have labs (blood work) drawn today and your tests are completely normal, you will receive your results only by: [name] MyChart Message (if you have MyChart) OR . A paper copy in the mail If you have any lab test that is abnormal or we need to change your treatment, we will call you to review the results.   Testing/Procedures: Your physician has requested that you have an echocardiogram. Echocardiography is a painless test that uses sound waves to create images of your heart. It provides your doctor with information about the size and shape of your heart and how well your heart's chambers and valves are working. This procedure takes approximately one hour. There are no restrictions for this procedure.   Your physician has requested that you have a carotid duplex. This test is an ultrasound of the carotid arteries in your neck. It looks at blood flow through these arteries that supply the brain with blood. Allow one hour for this exam. There are no restrictions or special instructions.     Follow-Up: At [facility], you and your health needs are our priority.  As part of our continuing mission to provide you with exceptional heart care, we have created designated Provider Care Teams.  These Care Teams include your primary Cardiologist (physician) and Advanced Practice Providers (APPs -  Physician Assistants and Nurse Practitioners) who all work together to provide you with the care you need, when you need it.  We recommend signing up for the patient portal called "MyChart".  Sign up information is provided on this After Visit Summary.  MyChart is used to connect with patients for Virtual Visits (Telemedicine).  Patients are able to view lab/test results, encounter notes, upcoming  appointments, etc.  Non-urgent messages can be sent to your provider as well.   To learn more about what you can do with MyChart, go to [email or web address].    Your next appointment:  Next available with Dr.Nishan   Other Instructions Referral has been placed to the [facility].They will call you for an appointment.       Thank you for choosing [facility] !

## 2019-10-14 ENCOUNTER — Ambulatory Visit (HOSPITAL_COMMUNITY)
Admission: RE | Admit: 2019-10-14 | Discharge: 2019-10-14 | Disposition: A | Discharge status: Home / Self Care | Payer: Medicare HMO | Source: Ambulatory Visit | Attending: Physician Assistant | Admitting: Physician Assistant

## 2019-10-14 ENCOUNTER — Encounter (HOSPITAL_COMMUNITY): Payer: Self-pay | Admitting: Physician Assistant

## 2019-10-14 ENCOUNTER — Other Ambulatory Visit: Payer: Self-pay

## 2019-10-14 VITALS — BP 160/100 | HR 91 | Ht 74.0 in | Wt 170.6 lb

## 2019-10-14 DIAGNOSIS — Z888 Allergy status to other drugs, medicaments and biological substances status: Secondary | ICD-10-CM | POA: Insufficient documentation

## 2019-10-14 DIAGNOSIS — Z823 Family history of stroke: Secondary | ICD-10-CM | POA: Insufficient documentation

## 2019-10-14 DIAGNOSIS — E785 Hyperlipidemia, unspecified: Secondary | ICD-10-CM | POA: Insufficient documentation

## 2019-10-14 DIAGNOSIS — I25810 Atherosclerosis of coronary artery bypass graft(s) without angina pectoris: Secondary | ICD-10-CM | POA: Diagnosis not present

## 2019-10-14 DIAGNOSIS — Z79899 Other long term (current) drug therapy: Secondary | ICD-10-CM | POA: Diagnosis not present

## 2019-10-14 DIAGNOSIS — K219 Gastro-esophageal reflux disease without esophagitis: Secondary | ICD-10-CM | POA: Insufficient documentation

## 2019-10-14 DIAGNOSIS — Z881 Allergy status to other antibiotic agents status: Secondary | ICD-10-CM | POA: Insufficient documentation

## 2019-10-14 DIAGNOSIS — I10 Essential (primary) hypertension: Secondary | ICD-10-CM | POA: Insufficient documentation

## 2019-10-14 DIAGNOSIS — I4811 Longstanding persistent atrial fibrillation: Secondary | ICD-10-CM

## 2019-10-14 DIAGNOSIS — Z7901 Long term (current) use of anticoagulants: Secondary | ICD-10-CM | POA: Diagnosis not present

## 2019-10-14 DIAGNOSIS — Z885 Allergy status to narcotic agent status: Secondary | ICD-10-CM | POA: Insufficient documentation

## 2019-10-14 DIAGNOSIS — Z8249 Family history of ischemic heart disease and other diseases of the circulatory system: Secondary | ICD-10-CM | POA: Diagnosis not present

## 2019-10-14 DIAGNOSIS — I4819 Other persistent atrial fibrillation: Secondary | ICD-10-CM | POA: Insufficient documentation

## 2019-10-14 DIAGNOSIS — D6869 Other thrombophilia: Secondary | ICD-10-CM | POA: Insufficient documentation

## 2019-10-14 DIAGNOSIS — Z951 Presence of aortocoronary bypass graft: Secondary | ICD-10-CM | POA: Insufficient documentation

## 2019-10-14 DIAGNOSIS — Z87891 Personal history of nicotine dependence: Secondary | ICD-10-CM | POA: Diagnosis not present

## 2019-10-14 LAB — COMPREHENSIVE METABOLIC PANEL
ALT: 32 U/L (ref 0–44)
AST: 30 U/L (ref 15–41)
Albumin: 4 g/dL (ref 3.5–5.0)
Alkaline Phosphatase: 110 U/L (ref 38–126)
Anion gap: 11 (ref 5–15)
BUN: 9 mg/dL (ref 8–23)
CO2: 23 mmol/L (ref 22–32)
Calcium: 9.3 mg/dL (ref 8.9–10.3)
Chloride: 96 mmol/L — ABNORMAL LOW (ref 98–111)
Creatinine, Ser: 0.88 mg/dL (ref 0.61–1.24)
GFR calc Af Amer: >60 mL/min (ref >60)
GFR calc non Af Amer: >60 mL/min (ref >60)
Glucose, Bld: 107 mg/dL — ABNORMAL HIGH (ref 70–99)
Potassium: 4.9 mmol/L (ref 3.5–5.1)
Sodium: 130 mmol/L — ABNORMAL LOW (ref 135–145)
Total Bilirubin: 0.9 mg/dL (ref 0.3–1.2)
Total Protein: 7 g/dL (ref 6.5–8.1)

## 2019-10-14 LAB — TSH: TSH: 0.969 u[IU]/mL (ref 0.350–4.500)

## 2019-10-14 MED ORDER — ROSUVASTATIN CALCIUM 5 MG PO TABS: 5.0000 mg | ORAL_TABLET | Freq: Every day | ORAL | 3 refills | Status: DC

## 2019-10-14 MED ORDER — AMIODARONE HCL 200 MG PO TABS: ORAL_TABLET | ORAL | 0 refills | Status: DC

## 2019-10-14 NOTE — Patient Instructions (Addendum)
Start amiodarone 200mg  twice a day for 1 month then we will reduce it to 200mg  once a day  Stop lovastatin  Start Crestor 5mg  once a day   You will need 4 weekly INR checks at [employer].

## 2019-10-14 NOTE — Progress Notes (Signed)
Primary Care Physician: Loman Brooklyn, FNP Referring Physician: Dr. Laurance Flatten Cardiologist: Dr. Murrell Converse. is a 86 y.o. male with a h/o PVD, CAD s/p bypass that was recently found to have afib with CVR at physical. Pt is minimally symptomatic. He will feel a slight irregular hear beat at times but otherwise has not noted any change in his energy or shortness of breath with exertion. He states that he is very active, chopping wood, walking and feels well.  Review of lifestyle shows pt had been taking 3-4 BC powders a day and drinking 5-6 beers a night. He has since stopped the [facility] powders and alcohol. Some caffeine but not excessive. Wife states that he use to snore but does not believe that it as  prevalent as it once was, but she does not sleep in the same room as him now. He did not pick up the eliquis he was prescribed because it would have cost him $200. He is interested in taking warfarin but to get a timely cardioversion it would be best to start Doac first with free 30 days card and plan on transfer to warfarin later. He has no bleeding history or bleeding tendencies.   F/u in afib clinic f/u updated echo. He has now been on apixaban 5 mg bid since 3/20. He has tried to go back to walking now that weather is warmer and has noted that he does tire out more easily since in afib. Discussion of trying to return to sinus rhythm with cardioversion, risk vrs benefit explained and pt would like to proceed.weight is stable. No unusual shortness of breath, no PND or orthopnea, no pedal edema.  F/u in afib clinic, after successful cardioversion. He feels much better. He is staying in [place]. He has stopped the [facility] powders and is no longer drinking beer. Continues with apixaban without missed doses, but may have to transition over to coumadin for cost issues in the next 1-2 months.  Follow up in the AF clinic 10/14/19. Patient reports dyspnea with exertion and fatigue over the last several weeks.  Reviewing the patient's history, he reports he went back into afib one month after his DCCV in 2018 and has been in afib ever since. He denies palpitations, heart racing, or dizziness. He denies bleeding issues on anticoagulation.   Today, he denies symptoms of palpitations, chest pain, orthopnea, PND, lower extremity edema, dizziness, presyncope, syncope, or neurologic sequela. The patient is tolerating medications without difficulties and is otherwise without complaint today.   Past Medical History:  Diagnosis Date  . Anxiety   . Atrial fibrillation ([place])   . CAD, ARTERY BYPASS GRAFT 05/19/2008   Qualifier: Diagnosis of  By: Mare Ferrari, [place], Sherri    . Enteritis due to Clostridium difficile, history of 11/21/2012  . GERD (gastroesophageal reflux disease)   . Hyperlipidemia   . Hypertension   . 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 Vitamin D deficiency    Past Surgical History:  Procedure Laterality Date  . CARDIOVERSION N/A 05/05/2016   Procedure: CARDIOVERSION;  Surgeon: Josue Hector, MD;  Location: [place];  Service: Cardiovascular;  Laterality: N/A;  . CAROTID ENDARTERECTOMY Left 2003  . CAROTID-SUBCLAVIAN BYPASS GRAFT Right   . COLONOSCOPY  2005   hyperplastic polyp x 1 with no adenoma  . COLONOSCOPY N/A 04/11/2014   [id number]:[id number] diverticulosis in the sigmoid colon/left colon is redundant  . CORONARY ARTERY BYPASS GRAFT    .  ESOPHAGEAL DILATION N/A 04/11/2014   Procedure: ESOPHAGEAL DILATION;  Surgeon: [name], MD;  [facility];  Service: Endoscopy;  Laterality: N/A;  . ESOPHAGOGASTRODUODENOSCOPY N/A 04/11/2014   [id number] at the gastro junction/moderate erosive/duodenal web  . EYE SURGERY Bilateral    cataracts  . SKIN CANCER EXCISION      Current Outpatient Medications  Medication Sig Dispense Refill  . Cholecalciferol (VITAMIN D-3 PO) Take 2,000 mg by mouth every morning.     [name] losartan (COZAAR) 50 MG tablet TAKE 1 TABLET  EVERY MORNING 90 tablet 0  . metoprolol tartrate (LOPRESSOR) 50 MG tablet Take 1 tablet (50 mg total) by mouth 2 (two) times daily. 180 tablet 3  . Multiple Vitamin (MULTIVITAMIN) tablet Take 1 tablet by mouth every morning.     . nitroGLYCERIN (NITROSTAT) 0.4 MG SL tablet PLACE 1 TABLET (0.4 MG TOTAL) UNDER THE TONGUE EVERY 5 (FIVE) MINUTES AS NEEDED FOR CHEST PAIN. 25 tablet 3  . Omega-3 Fatty Acids (FISH OIL) 1000 MG CAPS Take 2 capsules by mouth every morning.     [name] omeprazole (PRILOSEC) 20 MG capsule 1 capsule twice a day 180 capsule 3  . Probiotic Product (ALIGN) 4 MG CAPS Take 4 mg by mouth daily as needed.     . warfarin (COUMADIN) 5 MG tablet Take 1 tablet (5 mg total) by mouth daily. 90 tablet 2  . amiodarone (PACERONE) 200 MG tablet Take 1 tablet by mouth twice a day for 1 month then reduce to 1 tablet daily 60 tablet 0  . rosuvastatin (CRESTOR) 5 MG tablet Take 1 tablet (5 mg total) by mouth daily. 30 tablet 3   No current facility-administered medications for this encounter.    Allergies  Allergen Reactions  . Doxycycline Diarrhea  . Contrast Media [Iodinated Diagnostic Agents]     Syncope  . Lunesta [Eszopiclone]     "felt WILD"  . Morphine And Related Nausea And Vomiting  . Trazodone And Nefazodone Other (See Comments)    Extremely drowsy the next day after taking.    Social History   Socioeconomic History  . Marital status: Married    Spouse name: ruby  . Number of children: 1  . Years of education: Not on file  . Highest education level: Not on file  Occupational History  . Occupation: retired    Fish farm manager: [employer]  Tobacco Use  . Smoking status: Former Smoker    Types: Cigarettes    Start date: 01/24/1950    Quit date: 01/24/1961    Years since quitting: 58.7  . Smokeless tobacco: Never Used  Vaping Use  . Vaping Use: Never used  Substance and Sexual Activity  . Alcohol use: Not Currently    Alcohol/week: 0.0 standard drinks    Comment: not drank since  12/2009  . Drug use: No  . Sexual activity: Yes    Partners: Female  Other Topics Concern  . Not on file  Social History Narrative  . Not on file   Social Determinants of Health   Financial Resource Strain:   . Difficulty of Paying Living Expenses: Not on file  Food Insecurity:   . Worried About Charity fundraiser in the Last Year: Not on file  . Ran Out of Food in the Last Year: Not on file  Transportation Needs:   . Lack of Transportation (Medical): Not on file  . Lack of Transportation (Non-Medical): Not on file  Physical Activity:   . Days of  Exercise per Week: Not on file  . Minutes of Exercise per Session: Not on file  Stress:   . Feeling of Stress : Not on file  Social Connections:   . Frequency of Communication with Friends and Family: Not on file  . Frequency of Social Gatherings with Friends and Family: Not on file  . Attends Religious Services: Not on file  . Active Member of Clubs or Organizations: Not on file  . Attends Archivist Meetings: Not on file  . Marital Status: Not on file  Intimate Partner Violence:   . Fear of Current or Ex-Partner: Not on file  . Emotionally Abused: Not on file  . Physically Abused: Not on file  . Sexually Abused: Not on file    Family History  Problem Relation Age of Onset  . Heart disease Mother   . Stroke Mother   . Stroke Father   . Diabetes Son   . Stroke Son   . Other Sister        ruptured appendix   . Lung cancer Brother        asbestos  . Mental illness Sister   . Kidney disease Sister   . Colon cancer Neg Hx        "not that I know of."    ROS- All systems are reviewed and negative except as per the HPI above  Physical Exam: Vitals:   10/14/19 1027  BP: (!) 160/100  Pulse: 91  Weight: 77.4 kg  Height: 6\' 2"  (1.88 m)   Wt Readings from Last 3 Encounters:  10/14/19 77.4 kg  10/11/19 77.4 kg  10/01/19 78.5 kg    Labs: Lab Results  Component Value Date   NA 136 02/20/2019   K 4.2  02/20/2019   CL 97 02/20/2019   CO2 26 02/20/2019   GLUCOSE 94 02/20/2019   BUN 10 02/20/2019   CREATININE 0.85 02/20/2019   CALCIUM 9.2 02/20/2019   Lab Results  Component Value Date   INR 2.6 (H) 10/01/2019   Lab Results  Component Value Date   CHOL 170 02/20/2019   HDL 87 02/20/2019   LDLCALC 71 02/20/2019   TRIG 60 02/20/2019    GEN- The patient is well appearing elderly male, alert and oriented x 3 today.   HEENT-head normocephalic, atraumatic, sclera clear, conjunctiva pink, hearing intact, trachea midline. Lungs- Clear to ausculation bilaterally, normal work of breathing Heart- irregular rate and rhythm, no murmurs, rubs or gallops  GI- soft, NT, ND, + BS Extremities- no clubbing, cyanosis, or edema MS- no significant deformity or atrophy Skin- no rash or lesion Psych- euthymic mood, full affect Neuro- strength and sensation are intact   EKG- afib HR 91, QRS 92, QTc 447  Epic records reviewed  Echo-Study Conclusions  - Left ventricle: The cavity size was normal. Wall thickness was   normal. Systolic function was normal. The estimated ejection   fraction was in the range of 50% to 55%. Wall motion was normal;   there were no regional wall motion abnormalities. - Ventricular septum: Septal motion showed paradox. The contour   showed diastolic flattening. These changes are consistent with RV   volume overload. - Mitral valve: There was mild regurgitation. - Left atrium: The atrium was mildly dilated. - Right ventricle: The cavity size was mildly to moderately   dilated. Systolic function was reduced. - Right atrium: The atrium was moderately dilated.  Impressions:  - Right heart chamber enlargement, abnormal septal motion suggest  RV volume overload. No evidence of significant tricuspid or   pulmonary insufficiency and no visible intracardiac shunt.   Consider further evaluation for ASD or partial anomalous   pulmonary vein return if clinically  indicated.  CHA2DS2-VASc Score = 4  The patient's score is based upon: CHF History: 0 HTN History: 1 Age : 2 Diabetes History: 0 Stroke History: 0 Vascular Disease History: 1 Gender: 0      ASSESSMENT AND PLAN: 1. Longstanding Persistent Atrial Fibrillation The patient's CHA2DS2-VASc score is 4, indicating a 4.8% annual risk of stroke.   General education about afib provided and questions answered. He has been in afib since 2018. We discussed therapeutic options including rate and rhythm control. He would like a trial of SR to see if he feels any better.  Will plan to start amiodarone 200 mg BID for 4 weeks then decrease to once daily. Will check 4 weekly INRs in anticipation of DCCV. Check cmet/TSH today. We discussed the possibility that given the longstanding nature of his afib, we may not be able to restore SR and rate control would be the best option. Patient voices understanding and in agreement with plan.  Continue metoprolol 50 mg BID Continue warfarin Repeat echo pending  2. Secondary Hypercoagulable State (ICD10:  D68.69) The patient is at significant risk for stroke/thromboembolism based upon his CHA2DS2-VASc Score of 4.  Continue Warfarin (Coumadin).   3. HTN Elevated today, has been well controlled at home. Will reassess in SR.  4. CAD No anginal symptoms. Followed by Dr Johnsie Cancel.  5. HLD Will change lovastatin to rosuvastatin due to interaction with amiodarone. LFT as above.   Follow up in the AF clinic in 3 weeks.    [facility] [address] [place], [place] [address] [phone]

## 2019-10-16 ENCOUNTER — Other Ambulatory Visit: Payer: Self-pay

## 2019-10-16 ENCOUNTER — Encounter: Payer: Self-pay | Admitting: Family Medicine

## 2019-10-16 ENCOUNTER — Ambulatory Visit (INDEPENDENT_AMBULATORY_CARE_PROVIDER_SITE_OTHER): Payer: Medicare HMO | Admitting: Family Medicine

## 2019-10-16 VITALS — BP 149/91 | HR 91 | Temp 96.6°F | Ht 74.0 in | Wt 171.2 lb

## 2019-10-16 DIAGNOSIS — Z7901 Long term (current) use of anticoagulants: Secondary | ICD-10-CM | POA: Diagnosis not present

## 2019-10-16 DIAGNOSIS — I480 Paroxysmal atrial fibrillation: Secondary | ICD-10-CM

## 2019-10-16 DIAGNOSIS — G479 Sleep disorder, unspecified: Secondary | ICD-10-CM | POA: Diagnosis not present

## 2019-10-16 LAB — COAGUCHEK XS/INR WAIVED
INR: 2.6 — ABNORMAL HIGH (ref 0.9–1.1)
Prothrombin Time: 31.1 s

## 2019-10-16 MED ORDER — MIRTAZAPINE 15 MG PO TABS: 15.0000 mg | ORAL_TABLET | Freq: Every day | ORAL | 0 refills | Status: DC

## 2019-10-16 NOTE — Progress Notes (Signed)
Assessment & Plan:  1-2. Paroxysmal atrial fibrillation (HCC)/Long term (current) use of anticoagulants Description   INR today 2.6 (goal is 2-3).  Continue 5 mg once daily. Next INR in 1 week. This is 1 of 4 weekly INRs prior to cardioversion.      - CoaguChek XS/INR Waived  3. Difficulty sleeping - Uncontrolled.  Previously failed therapy with trazodone and temazepam.  Discussed options of trying either mirtazapine or Ambien and that mirtazapine may help with some depression he may be experiencing due to the death of his son.  Patient shows to try mirtazapine first. - mirtazapine (REMERON) 15 MG tablet; Take 1 tablet (15 mg total) by mouth at bedtime.  Dispense: 30 tablet; Refill: 0   Return in about 1 week (around 10/23/2019) for INR.  [name], MSN, APRN, FNP-C [facility]  Subjective:    Patient ID: Danny Frankson., male    DOB: Nov 26, 1931, 86 y.o.   MRN: [id number]  Patient Care Team: Loman Brooklyn, FNP as PCP - General (Family Medicine) Josue Hector, MD as PCP - Cardiology (Cardiology) Josue Hector, MD as Attending Physician (Cardiology) [name], MD (Inactive) as Consulting Physician (Gastroenterology) Okey Regal, OD (Optometry) Rexene Alberts, MD as Consulting Physician (Cardiothoracic Surgery) Ilean China, RN as Registered Nurse Allyn Kenner, MD as Consulting Physician (Dermatology)   Chief Complaint:  Chief Complaint  Patient presents with  . Coagulation Disorder  . trouble sleeping    Patient states this has been ongoing.    HPI: Danny Bleier. is a 86 y.o. male presenting on 10/16/2019 for Coagulation Disorder and trouble sleeping (Patient states this has been ongoing.)  Anticoagulation: Patient here for anticoagulation monitoring. This is the first of 4 weekly INRs prior to cardioversion. Indication: atrial fibrillation Bleeding Signs/Symptoms:  None Thromboembolic Signs/Symptoms:  None  Missed  Coumadin Doses:  None Medication Changes:  no Dietary Changes:  no Bacterial/Viral Infection:  no  New complaints: Patient is having trouble sleeping that has been worse since the death of his son.  He used to take Xanax for sleep which was discontinued as I do not prescribe Xanax for sleep.  He tried and failed therapy with trazodone and temazepam.   Social history:  Relevant past medical, surgical, family and social history reviewed and updated as indicated. Interim medical history since our last visit reviewed.  Allergies and medications reviewed and updated.  DATA REVIEWED: CHART IN EPIC  ROS: Negative unless specifically indicated above in HPI.    Current Outpatient Medications:  .  amiodarone (PACERONE) 200 MG tablet, Take 1 tablet by mouth twice a day for 1 month then reduce to 1 tablet daily, Disp: 60 tablet, Rfl: 0 .  Cholecalciferol (VITAMIN D-3 PO), Take 2,000 mg by mouth every morning. , Disp: , Rfl:  .  losartan (COZAAR) 50 MG tablet, TAKE 1 TABLET EVERY MORNING, Disp: 90 tablet, Rfl: 0 .  metoprolol tartrate (LOPRESSOR) 50 MG tablet, Take 1 tablet (50 mg total) by mouth 2 (two) times daily., Disp: 180 tablet, Rfl: 3 .  Multiple Vitamin (MULTIVITAMIN) tablet, Take 1 tablet by mouth every morning. , Disp: , Rfl:  .  nitroGLYCERIN (NITROSTAT) 0.4 MG SL tablet, PLACE 1 TABLET (0.4 MG TOTAL) UNDER THE TONGUE EVERY 5 (FIVE) MINUTES AS NEEDED FOR CHEST PAIN., Disp: 25 tablet, Rfl: 3 .  Omega-3 Fatty Acids (FISH OIL) 1000 MG CAPS, Take 2 capsules by mouth every morning. , Disp: ,  Rfl:  .  omeprazole (PRILOSEC) 20 MG capsule, 1 capsule twice a day, Disp: 180 capsule, Rfl: 3 .  Probiotic Product (ALIGN) 4 MG CAPS, Take 4 mg by mouth daily as needed. , Disp: , Rfl:  .  rosuvastatin (CRESTOR) 5 MG tablet, Take 1 tablet (5 mg total) by mouth daily., Disp: 30 tablet, Rfl: 3 .  warfarin (COUMADIN) 5 MG tablet, Take 1 tablet (5 mg total) by mouth daily., Disp: 90 tablet, Rfl: 2    Allergies  Allergen Reactions  . Doxycycline Diarrhea  . Contrast Media [Iodinated Diagnostic Agents]     Syncope  . Lunesta [Eszopiclone]     "felt WILD"  . Morphine And Related Nausea And Vomiting  . Trazodone And Nefazodone Other (See Comments)    Extremely drowsy the next day after taking.   Past Medical History:  Diagnosis Date  . Anxiety   . Atrial fibrillation ([place])   . CAD, ARTERY BYPASS GRAFT 05/19/2008   Qualifier: Diagnosis of  By: Mare Ferrari, [place], Sherri    . Enteritis due to Clostridium difficile, history of 11/21/2012  . GERD (gastroesophageal reflux disease)   . Hyperlipidemia   . Hypertension   . 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 Vitamin D deficiency     Past Surgical History:  Procedure Laterality Date  . CARDIOVERSION N/A 05/05/2016   Procedure: CARDIOVERSION;  Surgeon: Josue Hector, MD;  Location: [place];  Service: Cardiovascular;  Laterality: N/A;  . CAROTID ENDARTERECTOMY Left 2003  . CAROTID-SUBCLAVIAN BYPASS GRAFT Right   . COLONOSCOPY  2005   hyperplastic polyp x 1 with no adenoma  . COLONOSCOPY N/A 04/11/2014   [id number] diverticulosis in the sigmoid colon/left colon is redundant  . CORONARY ARTERY BYPASS GRAFT    . ESOPHAGEAL DILATION N/A 04/11/2014   Procedure: ESOPHAGEAL DILATION;  Surgeon: [name], MD;  [facility];  Service: Endoscopy;  Laterality: N/A;  . ESOPHAGOGASTRODUODENOSCOPY N/A 04/11/2014   [id number] at the gastro junction/moderate erosive/duodenal web  . EYE SURGERY Bilateral    cataracts  . SKIN CANCER EXCISION      Social History   Socioeconomic History  . Marital status: Married    Spouse name: ruby  . Number of children: 1  . Years of education: Not on file  . Highest education level: Not on file  Occupational History  . Occupation: retired    Fish farm manager: [employer]  Tobacco Use  . Smoking status: Former Smoker    Types: Cigarettes    Start date: 01/24/1950    Quit  date: 01/24/1961    Years since quitting: 58.7  . Smokeless tobacco: Never Used  Vaping Use  . Vaping Use: Never used  Substance and Sexual Activity  . Alcohol use: Not Currently    Alcohol/week: 0.0 standard drinks    Comment: not drank since 12/2009  . Drug use: No  . Sexual activity: Yes    Partners: Female  Other Topics Concern  . Not on file  Social History Narrative  . Not on file   Social Determinants of Health   Financial Resource Strain:   . Difficulty of Paying Living Expenses: Not on file  Food Insecurity:   . Worried About Charity fundraiser in the Last Year: Not on file  . Ran Out of Food in the Last Year: Not on file  Transportation Needs:   . Lack of Transportation (Medical): Not on file  . Lack of Transportation (Non-Medical):  Not on file  Physical Activity:   . Days of Exercise per Week: Not on file  . Minutes of Exercise per Session: Not on file  Stress:   . Feeling of Stress : Not on file  Social Connections:   . Frequency of Communication with Friends and Family: Not on file  . Frequency of Social Gatherings with Friends and Family: Not on file  . Attends Religious Services: Not on file  . Active Member of Clubs or Organizations: Not on file  . Attends Archivist Meetings: Not on file  . Marital Status: Not on file  Intimate Partner Violence:   . Fear of Current or Ex-Partner: Not on file  . Emotionally Abused: Not on file  . Physically Abused: Not on file  . Sexually Abused: Not on file        Objective:    BP (!) 149/91   Pulse 91   Temp (!) 96.6 F (35.9 C) (Temporal)   Ht 6\' 2"  (1.88 m)   Wt 171 lb 3.2 oz (77.7 kg)   SpO2 99%   BMI 21.98 kg/m   Wt Readings from Last 3 Encounters:  10/16/19 171 lb 3.2 oz (77.7 kg)  10/14/19 170 lb 9.6 oz (77.4 kg)  10/11/19 170 lb 9.6 oz (77.4 kg)    Physical Exam Vitals reviewed.  Constitutional:      General: He is not in acute distress.    Appearance: Normal appearance. He is  normal weight. He is not ill-appearing, toxic-appearing or diaphoretic.  HENT:     Head: Normocephalic and atraumatic.  Eyes:     General: No scleral icterus.       Right eye: No discharge.        Left eye: No discharge.     Conjunctiva/sclera: Conjunctivae normal.  Cardiovascular:     Rate and Rhythm: Normal rate.  Pulmonary:     Effort: Pulmonary effort is normal. No respiratory distress.  Musculoskeletal:        General: Normal range of motion.     Cervical back: Normal range of motion.  Skin:    General: Skin is warm and dry.  Neurological:     Mental Status: He is alert and oriented to person, place, and time. Mental status is at baseline.  Psychiatric:        Mood and Affect: Mood normal.        Behavior: Behavior normal.        Thought Content: Thought content normal.        Judgment: Judgment normal.     Lab Results  Component Value Date   TSH 0.969 10/14/2019   Lab Results  Component Value Date   WBC 6.8 02/20/2019   HGB 15.2 02/20/2019   HCT 45.5 02/20/2019   MCV 95 02/20/2019   PLT 239 02/20/2019   Lab Results  Component Value Date   NA 130 (L) 10/14/2019   K 4.9 10/14/2019   CO2 23 10/14/2019   GLUCOSE 107 (H) 10/14/2019   BUN 9 10/14/2019   CREATININE 0.88 10/14/2019   BILITOT 0.9 10/14/2019   ALKPHOS 110 10/14/2019   AST 30 10/14/2019   ALT 32 10/14/2019   PROT 7.0 10/14/2019   ALBUMIN 4.0 10/14/2019   CALCIUM 9.3 10/14/2019   ANIONGAP 11 10/14/2019   Lab Results  Component Value Date   CHOL 170 02/20/2019   Lab Results  Component Value Date   HDL 87 02/20/2019   Lab Results  Component  Value Date   LDLCALC 71 02/20/2019   Lab Results  Component Value Date   TRIG 60 02/20/2019   Lab Results  Component Value Date   CHOLHDL 2.0 02/20/2019   No results found for: HGBA1C

## 2019-10-21 ENCOUNTER — Telehealth (HOSPITAL_COMMUNITY): Payer: Self-pay | Admitting: *Deleted

## 2019-10-21 MED ORDER — AMIODARONE HCL 200 MG PO TABS: 200.0000 mg | ORAL_TABLET | Freq: Every day | ORAL | 0 refills | Status: DC

## 2019-10-21 NOTE — Telephone Encounter (Signed)
Pt called stating since starting new medications last week "he feels like his lower back/legs cannot hold him up, dizzy and staggering around."  BP 119-162/60-83 HR 72-83 Discussed with Adline Peals PA will decrease amiodarone to 200mg  once a day and call with update later in the week. Pt in agreement.

## 2019-10-23 ENCOUNTER — Ambulatory Visit: Payer: Medicare HMO | Admitting: Family Medicine

## 2019-10-23 ENCOUNTER — Ambulatory Visit (INDEPENDENT_AMBULATORY_CARE_PROVIDER_SITE_OTHER): Payer: Medicare HMO | Admitting: Family Medicine

## 2019-10-23 ENCOUNTER — Other Ambulatory Visit: Payer: Self-pay

## 2019-10-23 ENCOUNTER — Encounter: Payer: Self-pay | Admitting: Family Medicine

## 2019-10-23 VITALS — BP 141/84 | HR 85 | Temp 97.2°F | Ht 74.0 in | Wt 168.2 lb

## 2019-10-23 DIAGNOSIS — Z7901 Long term (current) use of anticoagulants: Secondary | ICD-10-CM | POA: Diagnosis not present

## 2019-10-23 DIAGNOSIS — G479 Sleep disorder, unspecified: Secondary | ICD-10-CM

## 2019-10-23 DIAGNOSIS — I480 Paroxysmal atrial fibrillation: Secondary | ICD-10-CM | POA: Diagnosis not present

## 2019-10-23 NOTE — Addendum Note (Signed)
Addended by: Nigel Berthold C on: 10/23/2019 02:09 PM   Modules accepted: Orders

## 2019-10-23 NOTE — Progress Notes (Signed)
Assessment & Plan:  1-2. Long term (current) use of anticoagulants/Paroxysmal atrial fibrillation [facility]) Description   INR today 4.7 (goal is 2-3).  Hold today's dose. Take half tablet tomorrow and every Thursday going forward, continue 5 mg all other days. Next INR in 1 week. Today is 2 of 4 weekly INRs prior to cardioversion.       3. Difficulty sleeping - Patient is going to try Remeron after discussing medication in office again.    Return next week as scheduled.  [name], MSN, APRN, FNP-C [facility]  Subjective:    Patient ID: Danny Hommel., male    DOB: [birth year], 86 y.o.   MRN: [id number]  Patient Care Team: Loman Brooklyn, FNP as PCP - General (Family Medicine) Josue Hector, MD as PCP - Cardiology (Cardiology) Josue Hector, MD as Attending Physician (Cardiology) [name], MD (Inactive) as Consulting Physician (Gastroenterology) Okey Regal, OD (Optometry) Rexene Alberts, MD as Consulting Physician (Cardiothoracic Surgery) Ilean China, RN as Registered Nurse Allyn Kenner, MD as Consulting Physician (Dermatology)   Chief Complaint:  Chief Complaint  Patient presents with  . Coagulation Disorder    HPI: Danny Ashraf. is a 86 y.o. male presenting on 10/23/2019 for Coagulation Disorder  Anticoagulation: Patient here for anticoagulation monitoring. This is the second of 4 weekly INRs prior to cardioversion. Indication: atrial fibrillation Bleeding Signs/Symptoms:  None Thromboembolic Signs/Symptoms:  None  Missed Coumadin Doses:  None Medication Changes:  yes - taking amiodarone 200 mg BID. He was advised he could decrease to QD due to side effects but has not yet done so.  Dietary Changes:  no Bacterial/Viral Infection:  No   Patient has not yet tried Remeron at bedtime as he reports he read the list of side effects.   New complaints: None  Social history:  Relevant past medical, surgical,  family and social history reviewed and updated as indicated. Interim medical history since our last visit reviewed.  Allergies and medications reviewed and updated.  DATA REVIEWED: CHART IN EPIC  ROS: Negative unless specifically indicated above in HPI.    Current Outpatient Medications:  .  amiodarone (PACERONE) 200 MG tablet, Take 1 tablet (200 mg total) by mouth daily., Disp: 60 tablet, Rfl: 0 .  Cholecalciferol (VITAMIN D-3 PO), Take 2,000 mg by mouth every morning. , Disp: , Rfl:  .  losartan (COZAAR) 50 MG tablet, TAKE 1 TABLET EVERY MORNING, Disp: 90 tablet, Rfl: 0 .  metoprolol tartrate (LOPRESSOR) 50 MG tablet, Take 1 tablet (50 mg total) by mouth 2 (two) times daily., Disp: 180 tablet, Rfl: 3 .  mirtazapine (REMERON) 15 MG tablet, Take 1 tablet (15 mg total) by mouth at bedtime., Disp: 30 tablet, Rfl: 0 .  Multiple Vitamin (MULTIVITAMIN) tablet, Take 1 tablet by mouth every morning. , Disp: , Rfl:  .  nitroGLYCERIN (NITROSTAT) 0.4 MG SL tablet, PLACE 1 TABLET (0.4 MG TOTAL) UNDER THE TONGUE EVERY 5 (FIVE) MINUTES AS NEEDED FOR CHEST PAIN., Disp: 25 tablet, Rfl: 3 .  Omega-3 Fatty Acids (FISH OIL) 1000 MG CAPS, Take 2 capsules by mouth every morning. , Disp: , Rfl:  .  omeprazole (PRILOSEC) 20 MG capsule, 1 capsule twice a day, Disp: 180 capsule, Rfl: 3 .  Probiotic Product (ALIGN) 4 MG CAPS, Take 4 mg by mouth daily as needed. , Disp: , Rfl:  .  rosuvastatin (CRESTOR) 5 MG tablet, Take 1 tablet (5 mg total) by  mouth daily., Disp: 30 tablet, Rfl: 3 .  warfarin (COUMADIN) 5 MG tablet, Take 1 tablet (5 mg total) by mouth daily., Disp: 90 tablet, Rfl: 2   Allergies  Allergen Reactions  . Doxycycline Diarrhea  . Contrast Media [Iodinated Diagnostic Agents]     Syncope  . Lunesta [Eszopiclone]     "felt WILD"  . Morphine And Related Nausea And Vomiting  . Trazodone And Nefazodone Other (See Comments)    Extremely drowsy the next day after taking.   Past Medical History:   Diagnosis Date  . Anxiety   . Atrial fibrillation ([place])   . CAD, ARTERY BYPASS GRAFT 05/19/2008   Qualifier: Diagnosis of  By: Mare Ferrari, [place], Sherri    . Enteritis due to Clostridium difficile, history of 11/21/2012  . GERD (gastroesophageal reflux disease)   . Hyperlipidemia   . Hypertension   . 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 Vitamin D deficiency     Past Surgical History:  Procedure Laterality Date  . CARDIOVERSION N/A 05/05/2016   Procedure: CARDIOVERSION;  Surgeon: Josue Hector, MD;  Location: [facility];  Service: Cardiovascular;  Laterality: N/A;  . CAROTID ENDARTERECTOMY Left 2003  . CAROTID-SUBCLAVIAN BYPASS GRAFT Right   . COLONOSCOPY  2005   hyperplastic polyp x 1 with no adenoma  . COLONOSCOPY N/A 04/11/2014   HOZ:YYQM diverticulosis in the sigmoid colon/left colon is redundant  . CORONARY ARTERY BYPASS GRAFT    . ESOPHAGEAL DILATION N/A 04/11/2014   Procedure: ESOPHAGEAL DILATION;  Surgeon: [name], MD;  [facility];  Service: Endoscopy;  Laterality: N/A;  . ESOPHAGOGASTRODUODENOSCOPY N/A 04/11/2014   GNO:[id number] at the gastro junction/moderate erosive/duodenal web  . EYE SURGERY Bilateral    cataracts  . SKIN CANCER EXCISION      Social History   Socioeconomic History  . Marital status: Married    Spouse name: ruby  . Number of children: 1  . Years of education: Not on file  . Highest education level: Not on file  Occupational History  . Occupation: retired    Fish farm manager: [employer]  Tobacco Use  . Smoking status: Former Smoker    Types: Cigarettes    Start date: 01/24/1950    Quit date: 01/24/1961    Years since quitting: 58.7  . Smokeless tobacco: Never Used  Vaping Use  . Vaping Use: Never used  Substance and Sexual Activity  . Alcohol use: Not Currently    Alcohol/week: 0.0 standard drinks    Comment: not drank since 12/2009  . Drug use: No  . Sexual activity: Yes    Partners: Female  Other Topics  Concern  . Not on file  Social History Narrative  . Not on file   Social Determinants of Health   Financial Resource Strain:   . Difficulty of Paying Living Expenses: Not on file  Food Insecurity:   . Worried About Charity fundraiser in the Last Year: Not on file  . Ran Out of Food in the Last Year: Not on file  Transportation Needs:   . Lack of Transportation (Medical): Not on file  . Lack of Transportation (Non-Medical): Not on file  Physical Activity:   . Days of Exercise per Week: Not on file  . Minutes of Exercise per Session: Not on file  Stress:   . Feeling of Stress : Not on file  Social Connections:   . Frequency of Communication with Friends and Family: Not on  file  . Frequency of Social Gatherings with Friends and Family: Not on file  . Attends Religious Services: Not on file  . Active Member of Clubs or Organizations: Not on file  . Attends Archivist Meetings: Not on file  . Marital Status: Not on file  Intimate Partner Violence:   . Fear of Current or Ex-Partner: Not on file  . Emotionally Abused: Not on file  . Physically Abused: Not on file  . Sexually Abused: Not on file        Objective:    BP (!) 141/84   Pulse 85   Temp (!) 97.2 F (36.2 C) (Temporal)   Ht 6\' 2"  (1.88 m)   Wt 168 lb 3.2 oz (76.3 kg)   SpO2 98%   BMI 21.60 kg/m   Wt Readings from Last 3 Encounters:  10/23/19 168 lb 3.2 oz (76.3 kg)  10/16/19 171 lb 3.2 oz (77.7 kg)  10/14/19 170 lb 9.6 oz (77.4 kg)    Physical Exam Vitals reviewed.  Constitutional:      General: He is not in acute distress.    Appearance: Normal appearance. He is normal weight. He is not ill-appearing, toxic-appearing or diaphoretic.  HENT:     Head: Normocephalic and atraumatic.  Eyes:     General: No scleral icterus.       Right eye: No discharge.        Left eye: No discharge.     Conjunctiva/sclera: Conjunctivae normal.  Cardiovascular:     Rate and Rhythm: Normal rate.  Pulmonary:      Effort: Pulmonary effort is normal. No respiratory distress.  Musculoskeletal:        General: Normal range of motion.     Cervical back: Normal range of motion.  Skin:    General: Skin is warm and dry.  Neurological:     Mental Status: He is alert and oriented to person, place, and time. Mental status is at baseline.  Psychiatric:        Mood and Affect: Mood normal.        Behavior: Behavior normal.        Thought Content: Thought content normal.        Judgment: Judgment normal.     Lab Results  Component Value Date   TSH 0.969 10/14/2019   Lab Results  Component Value Date   WBC 6.8 02/20/2019   HGB 15.2 02/20/2019   HCT 45.5 02/20/2019   MCV 95 02/20/2019   PLT 239 02/20/2019   Lab Results  Component Value Date   NA 130 (L) 10/14/2019   K 4.9 10/14/2019   CO2 23 10/14/2019   GLUCOSE 107 (H) 10/14/2019   BUN 9 10/14/2019   CREATININE 0.88 10/14/2019   BILITOT 0.9 10/14/2019   ALKPHOS 110 10/14/2019   AST 30 10/14/2019   ALT 32 10/14/2019   PROT 7.0 10/14/2019   ALBUMIN 4.0 10/14/2019   CALCIUM 9.3 10/14/2019   ANIONGAP 11 10/14/2019   Lab Results  Component Value Date   CHOL 170 02/20/2019   Lab Results  Component Value Date   HDL 87 02/20/2019   Lab Results  Component Value Date   LDLCALC 71 02/20/2019   Lab Results  Component Value Date   TRIG 60 02/20/2019   Lab Results  Component Value Date   CHOLHDL 2.0 02/20/2019   No results found for: HGBA1C

## 2019-10-24 DIAGNOSIS — Z7901 Long term (current) use of anticoagulants: Secondary | ICD-10-CM | POA: Diagnosis not present

## 2019-10-24 LAB — COAGUCHEK XS/INR WAIVED
INR: 4.7 — ABNORMAL HIGH (ref 0.9–1.1)
Prothrombin Time: 56.9 s

## 2019-10-30 ENCOUNTER — Ambulatory Visit (INDEPENDENT_AMBULATORY_CARE_PROVIDER_SITE_OTHER): Payer: Medicare HMO | Admitting: Family Medicine

## 2019-10-30 ENCOUNTER — Ambulatory Visit: Payer: Medicare HMO | Admitting: Family Medicine

## 2019-10-30 ENCOUNTER — Other Ambulatory Visit: Payer: Self-pay

## 2019-10-30 ENCOUNTER — Encounter: Payer: Self-pay | Admitting: Family Medicine

## 2019-10-30 ENCOUNTER — Ambulatory Visit: Payer: Medicare HMO

## 2019-10-30 VITALS — BP 134/75 | HR 88 | Temp 98.2°F | Ht 74.0 in | Wt 168.0 lb

## 2019-10-30 DIAGNOSIS — I480 Paroxysmal atrial fibrillation: Secondary | ICD-10-CM

## 2019-10-30 DIAGNOSIS — Z7901 Long term (current) use of anticoagulants: Secondary | ICD-10-CM

## 2019-10-30 LAB — COAGUCHEK XS/INR WAIVED
INR: 2.8 — ABNORMAL HIGH (ref 0.9–1.1)
Prothrombin Time: 33.9 s

## 2019-10-30 NOTE — Progress Notes (Signed)
BP 134/75   Pulse 88   Temp 98.2 F (36.8 C)   Ht 6\' 2"  (1.88 m)   Wt 76.2 kg   SpO2 99%   BMI 21.57 kg/m    Subjective:   Patient ID: Danny [name]., male    DOB: [birth year], 86 y.o.   MRN: [id number]  HPI: Teller Wakefield. is a 86 y.o. male presenting on 10/30/2019 for Medical Management of Chronic Issues and Atrial Fibrillation   HPI  Patient presents today for management of INR. He is scheduled for cardioversion on 10/12 and requires 4 weekly checks of INR. Today is day 3/4. Day 4/4 is currently scheduled for 10/11; patient requesting to reschedule to 10/10. Patient's INR on 9/30 was 4.7. Warfarin was held that day, adjusted to 2.5mg  on Thursday, then resumed 5mg  daily. Patient states he has been taking Warfarin as directed. He denies symptoms of bleeding including epistaxis, easy bruising, melena/hematochezia. He states he has not had any dietary changes and no increase in vitamin K rich foods. He denies chest pain, palpitations, dyspnea, dizziness, lightheadedness, peripheral edema.    Relevant past medical, surgical, family and social history reviewed and updated as indicated. Interim medical history since our last visit reviewed. Allergies and medications reviewed and updated.  Review of Systems  Constitutional: Negative for diaphoresis.  Respiratory: Negative for shortness of breath.   Cardiovascular: Negative for chest pain, palpitations and leg swelling.  Gastrointestinal: Negative for abdominal pain.  Skin: Negative for pallor and wound.  Neurological: Negative for dizziness, weakness and light-headedness.  Hematological: Does not bruise/bleed easily.    Per HPI unless specifically indicated above      Objective:   BP 134/75   Pulse 88   Temp 98.2 F (36.8 C)   Ht 6\' 2"  (1.88 m)   Wt 76.2 kg   SpO2 99%   BMI 21.57 kg/m   Wt Readings from Last 3 Encounters:  10/30/19 76.2 kg  10/23/19 76.3 kg  10/16/19 77.7 kg    Physical Exam Constitutional:       General: He is not in acute distress.    Appearance: He is not ill-appearing.  HENT:     Head: Normocephalic and atraumatic.  Eyes:     Conjunctiva/sclera: Conjunctivae normal.  Cardiovascular:     Rate and Rhythm: Normal rate. Rhythm irregular.     Pulses: Normal pulses.     Heart sounds: Murmur heard.      Comments: 2+ midsystolic murmur at LUSB Pulmonary:     Effort: Pulmonary effort is normal.     Breath sounds: Normal breath sounds.  Musculoskeletal:     Cervical back: Normal range of motion.     Right lower leg: No edema.     Left lower leg: No edema.  Skin:    General: Skin is warm and dry.     Coloration: Skin is not pale.     Findings: No bruising.  Neurological:     General: No focal deficit present.     Mental Status: He is alert.  Psychiatric:        Mood and Affect: Mood normal.        Behavior: Behavior normal.       Assessment & Plan:   Problem List Items Addressed This Visit      Cardiovascular and Mediastinum   Paroxysmal atrial fibrillation ([place])     Other   Long term (current) use of anticoagulants [Z79.01] - Primary   Relevant  Orders   CoaguChek XS/INR Waived (Completed)       Follow up plan: Return in about 5 days (around 11/04/2019), or if symptoms worsen or fail to improve, for INR recheck prior to procedure.  Please change from Wednesday to Monday visit for next week.  Description   INR today 2.8 (goal is 2-3).  Continue to Take half tablet every Thursday going forward, continue 5 mg all other days. Next INR in 1 week. Today is 3 of 4 weekly INRs prior to cardioversion.        Counseling provided for all of the vaccine components Orders Placed This Encounter  Procedures  . CoaguChek XS/INR Waived   Patient seen and examined with Luella Cook, PA student, agree with assessment and plan above.  We will get patient rescheduled so he has for check before his procedure, his INR looks good today at 2.8, we will keep the current  dose going and adjust from there. Caryl Pina, MD [facility] 10/30/2019, 9:30 AM

## 2019-10-31 DIAGNOSIS — X32XXXD Exposure to sunlight, subsequent encounter: Secondary | ICD-10-CM | POA: Diagnosis not present

## 2019-10-31 DIAGNOSIS — L570 Actinic keratosis: Secondary | ICD-10-CM | POA: Diagnosis not present

## 2019-11-04 ENCOUNTER — Other Ambulatory Visit: Payer: Self-pay

## 2019-11-04 ENCOUNTER — Ambulatory Visit (INDEPENDENT_AMBULATORY_CARE_PROVIDER_SITE_OTHER): Payer: Medicare HMO | Admitting: Family

## 2019-11-04 ENCOUNTER — Encounter: Payer: Self-pay | Admitting: Family

## 2019-11-04 VITALS — BP 139/76 | HR 84 | Temp 97.0°F | Ht 74.0 in | Wt 167.2 lb

## 2019-11-04 DIAGNOSIS — I480 Paroxysmal atrial fibrillation: Secondary | ICD-10-CM | POA: Diagnosis not present

## 2019-11-04 DIAGNOSIS — Z7901 Long term (current) use of anticoagulants: Secondary | ICD-10-CM

## 2019-11-04 LAB — COAGUCHEK XS/INR WAIVED
INR: 2.6 — ABNORMAL HIGH (ref 0.9–1.1)
Prothrombin Time: 31.6 s

## 2019-11-04 NOTE — Patient Instructions (Signed)
Description   INR today 2.6 (goal is 2-3).   Continue to Take half tablet  (2.5 mg) every Thursday, and 5 mg all other days.

## 2019-11-04 NOTE — Progress Notes (Signed)
° °  Subjective:    Patient ID: Danny Loron., male    DOB: 18-Dec-1931, 86 y.o.   MRN: [id number]  Chief Complaint  Patient presents with   anticoagulant    HPI  PT presents to the office today for 4 weekly INR draws. He is scheduled for cardioversion on 11/05/19. His INR today is 2.6 at is at goal. See anticoagulation flowsheet. He is currently taking his warfarin 5 mg every day except Thursday 2.5 mg.   Denies any bleeding, bruising, missed doses, or diet changes.   Review of Systems  All other systems reviewed and are negative.      Objective:   Physical Exam Vitals reviewed.  Constitutional:      General: He is not in acute distress.    Appearance: He is well-developed.  HENT:     Head: Normocephalic.     Right Ear: Tympanic membrane normal.     Left Ear: Tympanic membrane normal.  Eyes:     General:        Right eye: No discharge.        Left eye: No discharge.     Pupils: Pupils are equal, round, and reactive to light.  Neck:     Thyroid: No thyromegaly.  Cardiovascular:     Rate and Rhythm: Normal rate and regular rhythm.     Heart sounds: Murmur heard.   Pulmonary:     Effort: Pulmonary effort is normal. No respiratory distress.     Breath sounds: Normal breath sounds. No wheezing.  Abdominal:     General: Bowel sounds are normal. There is no distension.     Palpations: Abdomen is soft.     Tenderness: There is no abdominal tenderness.  Musculoskeletal:        General: No tenderness. Normal range of motion.     Cervical back: Normal range of motion and neck supple.  Skin:    General: Skin is warm and dry.     Findings: No erythema or rash.  Neurological:     Mental Status: He is alert and oriented to person, place, and time.     Cranial Nerves: No cranial nerve deficit.     Deep Tendon Reflexes: Reflexes are normal and symmetric.  Psychiatric:        Behavior: Behavior normal.        Thought Content: Thought content normal.        Judgment:  Judgment normal.        BP 139/76    Pulse 84    Temp (!) 97 F (36.1 C) (Temporal)    Ht 6\' 2"  (1.88 m)    Wt 167 lb 3.2 oz (75.8 kg)    SpO2 99%    BMI 21.47 kg/m   Assessment & Plan:  Danny Michels. comes in today with chief complaint of anticoagulant   Diagnosis and orders addressed:  1. Long term (current) use of anticoagulants - CoaguChek XS/INR Waived  2. Paroxysmal atrial fibrillation ([facility]) Description   INR today 2.6 (goal is 2-3).   Continue to Take half tablet  (2.5 mg) every Thursday, and 5 mg all other days.           Health Maintenance reviewed Diet and exercise encouraged    Evelina Dun, FNP

## 2019-11-05 ENCOUNTER — Ambulatory Visit (HOSPITAL_COMMUNITY)
Admission: RE | Admit: 2019-11-05 | Discharge: 2019-11-05 | Disposition: A | Discharge status: Home / Self Care | Payer: Medicare HMO | Source: Ambulatory Visit | Attending: Physician Assistant | Admitting: Physician Assistant

## 2019-11-05 VITALS — BP 176/84 | HR 85 | Ht 74.0 in | Wt 167.2 lb

## 2019-11-05 DIAGNOSIS — I2510 Atherosclerotic heart disease of native coronary artery without angina pectoris: Secondary | ICD-10-CM | POA: Diagnosis not present

## 2019-11-05 DIAGNOSIS — Z87891 Personal history of nicotine dependence: Secondary | ICD-10-CM | POA: Insufficient documentation

## 2019-11-05 DIAGNOSIS — E785 Hyperlipidemia, unspecified: Secondary | ICD-10-CM | POA: Insufficient documentation

## 2019-11-05 DIAGNOSIS — K219 Gastro-esophageal reflux disease without esophagitis: Secondary | ICD-10-CM | POA: Insufficient documentation

## 2019-11-05 DIAGNOSIS — D6869 Other thrombophilia: Secondary | ICD-10-CM | POA: Diagnosis not present

## 2019-11-05 DIAGNOSIS — Z79899 Other long term (current) drug therapy: Secondary | ICD-10-CM | POA: Insufficient documentation

## 2019-11-05 DIAGNOSIS — I4811 Longstanding persistent atrial fibrillation: Secondary | ICD-10-CM | POA: Insufficient documentation

## 2019-11-05 DIAGNOSIS — E559 Vitamin D deficiency, unspecified: Secondary | ICD-10-CM | POA: Insufficient documentation

## 2019-11-05 DIAGNOSIS — Z7901 Long term (current) use of anticoagulants: Secondary | ICD-10-CM | POA: Insufficient documentation

## 2019-11-05 DIAGNOSIS — I10 Essential (primary) hypertension: Secondary | ICD-10-CM | POA: Insufficient documentation

## 2019-11-05 MED ORDER — AMIODARONE HCL 200 MG PO TABS
100.0000 mg | ORAL_TABLET | Freq: Every day | ORAL | 3 refills | Status: DC
Start: 2019-11-05 — End: 2020-09-08

## 2019-11-05 MED ORDER — AMIODARONE HCL 200 MG PO TABS: 100.0000 mg | ORAL_TABLET | Freq: Every day | ORAL | 1 refills | Status: DC

## 2019-11-05 NOTE — Patient Instructions (Signed)
Decrease Amiodarone 100mg  a day (1/2 of the 200mg  tablet)

## 2019-11-05 NOTE — Progress Notes (Signed)
Primary Care Physician: Loman Brooklyn, FNP Referring Physician: Dr. Laurance Flatten Cardiologist: Dr. Murrell Converse. is a 86 y.o. male with a h/o PVD, CAD s/p bypass that was recently found to have afib with CVR at physical. Pt is minimally symptomatic. He will feel a slight irregular hear beat at times but otherwise has not noted any change in his energy or shortness of breath with exertion. He states that he is very active, chopping wood, walking and feels well.  Review of lifestyle shows pt had been taking 3-4 BC powders a day and drinking 5-6 beers a night. He has since stopped the [facility] powders and alcohol. Some caffeine but not excessive. Wife states that he use to snore but does not believe that it as  prevalent as it once was, but she does not sleep in the same room as him now. He did not pick up the eliquis he was prescribed because it would have cost him $200. He is interested in taking warfarin but to get a timely cardioversion it would be best to start Doac first with free 30 days card and plan on transfer to warfarin later. He has no bleeding history or bleeding tendencies.   F/u in afib clinic f/u updated echo. He has now been on apixaban 5 mg bid since 3/20. He has tried to go back to walking now that weather is warmer and has noted that he does tire out more easily since in afib. Discussion of trying to return to sinus rhythm with cardioversion, risk vrs benefit explained and pt would like to proceed.weight is stable. No unusual shortness of breath, no PND or orthopnea, no pedal edema.  F/u in afib clinic, after successful cardioversion. He feels much better. He is staying in [place]. He has stopped the [facility] powders and is no longer drinking beer. Continues with apixaban without missed doses, but may have to transition over to coumadin for cost issues in the next 1-2 months.  Follow up in the AF clinic 10/14/19. Patient reports dyspnea with exertion and fatigue over the last several weeks.  Reviewing the patient's history, he reports he went back into afib one month after his DCCV in 2018 and has been in afib ever since. He denies palpitations, heart racing, or dizziness. He denies bleeding issues on anticoagulation.   Follow up in the AF clinic 11/05/19. Patient reports that soon after starting amiodarone he had symptoms of dizziness and fatigue. The amiodarone was decreased to 200 mg daily and the symptoms resolved. Today, he states he "feels the best I have felt for a long while." He remains in rate controlled afib today. He denies bleeding issues with anticoagulation.   Today, he denies symptoms of palpitations, chest pain, orthopnea, PND, lower extremity edema, dizziness, presyncope, syncope, or neurologic sequela. The patient is tolerating medications without difficulties and is otherwise without complaint today.   Past Medical History:  Diagnosis Date  . Anxiety   . Atrial fibrillation ([place])   . CAD, ARTERY BYPASS GRAFT 05/19/2008   Qualifier: Diagnosis of  By: Mare Ferrari, [place], Sherri    . Enteritis due to Clostridium difficile, history of 11/21/2012  . GERD (gastroesophageal reflux disease)   . Hyperlipidemia   . Hypertension   . 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 Vitamin D deficiency    Past Surgical History:  Procedure Laterality Date  . CARDIOVERSION N/A 05/05/2016   Procedure: CARDIOVERSION;  Surgeon: Josue Hector, MD;  Location: [facility] ENDOSCOPY;  Service: Cardiovascular;  Laterality: N/A;  . CAROTID ENDARTERECTOMY Left 2003  . CAROTID-SUBCLAVIAN BYPASS GRAFT Right   . COLONOSCOPY  2005   hyperplastic polyp x 1 with no adenoma  . COLONOSCOPY N/A 04/11/2014   FBP:ZWCH diverticulosis in the sigmoid colon/left colon is redundant  . CORONARY ARTERY BYPASS GRAFT    . ESOPHAGEAL DILATION N/A 04/11/2014   Procedure: ESOPHAGEAL DILATION;  Surgeon: [name], MD;  Location: [facility] ENDO SUITE;  Service: Endoscopy;  Laterality: N/A;  .  ESOPHAGOGASTRODUODENOSCOPY N/A 04/11/2014   ENI:DPOEUMPNT at the gastro junction/moderate erosive/duodenal web  . EYE SURGERY Bilateral    cataracts  . SKIN CANCER EXCISION      Current Outpatient Medications  Medication Sig Dispense Refill  . Acetaminophen 500 MG capsule Take 2 capsules by mouth as needed.    [name] amiodarone (PACERONE) 200 MG tablet Take 0.5 tablets (100 mg total) by mouth daily. 45 tablet 1  . Cholecalciferol (VITAMIN D-3 PO) Take 2,000 mg by mouth every morning.     [name] losartan (COZAAR) 50 MG tablet TAKE 1 TABLET EVERY MORNING 90 tablet 0  . metoprolol tartrate (LOPRESSOR) 50 MG tablet Take 1 tablet (50 mg total) by mouth 2 (two) times daily. 180 tablet 3  . mirtazapine (REMERON) 15 MG tablet Take 1 tablet (15 mg total) by mouth at bedtime. 30 tablet 0  . Multiple Vitamin (MULTIVITAMIN) tablet Take 1 tablet by mouth every morning.     . nitroGLYCERIN (NITROSTAT) 0.4 MG SL tablet PLACE 1 TABLET (0.4 MG TOTAL) UNDER THE TONGUE EVERY 5 (FIVE) MINUTES AS NEEDED FOR CHEST PAIN. 25 tablet 3  . Omega-3 Fatty Acids (FISH OIL) 1000 MG CAPS Take 2 capsules by mouth every morning.     [name] omeprazole (PRILOSEC) 20 MG capsule 1 capsule twice a day 180 capsule 3  . Probiotic Product (ALIGN) 4 MG CAPS Take 4 mg by mouth daily as needed.     . rosuvastatin (CRESTOR) 5 MG tablet Take 1 tablet (5 mg total) by mouth daily. 30 tablet 3  . warfarin (COUMADIN) 5 MG tablet Take 1 tablet (5 mg total) by mouth daily. 90 tablet 2   No current facility-administered medications for this encounter.    Allergies  Allergen Reactions  . Doxycycline Diarrhea  . Contrast Media [Iodinated Diagnostic Agents]     Syncope  . Lunesta [Eszopiclone]     "felt WILD"  . Morphine And Related Nausea And Vomiting  . Trazodone And Nefazodone Other (See Comments)    Extremely drowsy the next day after taking.    Social History   Socioeconomic History  . Marital status: Married    Spouse name: ruby  . Number  of children: 1  . Years of education: Not on file  . Highest education level: Not on file  Occupational History  . Occupation: retired    Fish farm manager: [employer]  Tobacco Use  . Smoking status: Former Smoker    Types: Cigarettes    Start date: 01/24/1950    Quit date: 01/24/1961    Years since quitting: 58.8  . Smokeless tobacco: Never Used  Vaping Use  . Vaping Use: Never used  Substance and Sexual Activity  . Alcohol use: Not Currently    Alcohol/week: 0.0 standard drinks    Comment: not drank since 12/2009  . Drug use: No  . Sexual activity: Yes    Partners: Female  Other Topics Concern  . Not on file  Social History Narrative  .  Not on file   Social Determinants of Health   Financial Resource Strain:   . Difficulty of Paying Living Expenses: Not on file  Food Insecurity:   . Worried About Charity fundraiser in the Last Year: Not on file  . Ran Out of Food in the Last Year: Not on file  Transportation Needs:   . Lack of Transportation (Medical): Not on file  . Lack of Transportation (Non-Medical): Not on file  Physical Activity:   . Days of Exercise per Week: Not on file  . Minutes of Exercise per Session: Not on file  Stress:   . Feeling of Stress : Not on file  Social Connections:   . Frequency of Communication with Friends and Family: Not on file  . Frequency of Social Gatherings with Friends and Family: Not on file  . Attends Religious Services: Not on file  . Active Member of Clubs or Organizations: Not on file  . Attends Archivist Meetings: Not on file  . Marital Status: Not on file  Intimate Partner Violence:   . Fear of Current or Ex-Partner: Not on file  . Emotionally Abused: Not on file  . Physically Abused: Not on file  . Sexually Abused: Not on file    Family History  Problem Relation Age of Onset  . Heart disease Mother   . Stroke Mother   . Stroke Father   . Diabetes Son   . Stroke Son   . Other Sister        ruptured appendix   . Lung  cancer Brother        asbestos  . Mental illness Sister   . Kidney disease Sister   . Colon cancer Neg Hx        "not that I know of."    ROS- All systems are reviewed and negative except as per the HPI above  Physical Exam: Vitals:   11/05/19 1023  BP: (!) 176/84  Pulse: 85  Weight: 75.8 kg  Height: 6\' 2"  (1.88 m)   Wt Readings from Last 3 Encounters:  11/05/19 75.8 kg  11/04/19 75.8 kg  10/30/19 76.2 kg    Labs: Lab Results  Component Value Date   NA 130 (L) 10/14/2019   K 4.9 10/14/2019   CL 96 (L) 10/14/2019   CO2 23 10/14/2019   GLUCOSE 107 (H) 10/14/2019   BUN 9 10/14/2019   CREATININE 0.88 10/14/2019   CALCIUM 9.3 10/14/2019   Lab Results  Component Value Date   INR 2.6 (H) 11/04/2019   Lab Results  Component Value Date   CHOL 170 02/20/2019   HDL 87 02/20/2019   LDLCALC 71 02/20/2019   TRIG 60 02/20/2019    GEN- The patient is well appearing elderly male, alert and oriented x 3 today.   HEENT-head normocephalic, atraumatic, sclera clear, conjunctiva pink, hearing intact, trachea midline. Lungs- Clear to ausculation bilaterally, normal work of breathing Heart- irregular rate and rhythm, no murmurs, rubs or gallops  GI- soft, NT, ND, + BS Extremities- no clubbing, cyanosis, or edema MS- no significant deformity or atrophy Skin- no rash or lesion Psych- euthymic mood, full affect Neuro- strength and sensation are intact   EKG- afib HR 85, QRS 86, QTc 445  Epic records reviewed  Echo 04/25/16-Study Conclusions  - Left ventricle: The cavity size was normal. Wall thickness was   normal. Systolic function was normal. The estimated ejection   fraction was in the range of 50%  to 55%. Wall motion was normal;   there were no regional wall motion abnormalities. - Ventricular septum: Septal motion showed paradox. The contour   showed diastolic flattening. These changes are consistent with RV   volume overload. - Mitral valve: There was mild  regurgitation. - Left atrium: The atrium was mildly dilated. - Right ventricle: The cavity size was mildly to moderately   dilated. Systolic function was reduced. - Right atrium: The atrium was moderately dilated.  Impressions:  - Right heart chamber enlargement, abnormal septal motion suggest   RV volume overload. No evidence of significant tricuspid or   pulmonary insufficiency and no visible intracardiac shunt.   Consider further evaluation for ASD or partial anomalous   pulmonary vein return if clinically indicated.  CHA2DS2-VASc Score = 4  The patient's score is based upon: CHF History: 0 HTN History: 1 Diabetes History: 0 Stroke History: 0 Vascular Disease History: 1      ASSESSMENT AND PLAN: 1. Longstanding Persistent Atrial Fibrillation The patient's CHA2DS2-VASc score is 4, indicating a 4.8% annual risk of stroke.   We again discussed rate vs rhythm control. Patient has changed his mind and does not want to pursue DCCV at this time. Will stop weekly INR. We discussed the uses of amiodarone as well as the side effect profile. I indicated that there are other rate controlling medications we could use if we are not pursuing SR. Patient voices understanding and wishes to continue amiodarone for rate control given his symptomatic improvement. Recheck cmet/TSH at follow up. Will decrease amiodarone 100 mg daily.  Continue metoprolol 50 mg BID Continue warfarin  2. Secondary Hypercoagulable State (ICD10:  D68.69) The patient is at significant risk for stroke/thromboembolism based upon his CHA2DS2-VASc Score of 4.  Continue Warfarin (Coumadin).   3. HTN Elevated today, patient brings in a BP log from home which shows good control. Will not make changes today.   4. CAD No anginal symptoms.   Follow up with Dr Johnsie Cancel as scheduled. AF clinic in 6 months.    [facility] [address] [place], [place]  [address] [phone]

## 2019-11-06 ENCOUNTER — Ambulatory Visit: Payer: Medicare HMO | Admitting: Family Medicine

## 2019-11-07 ENCOUNTER — Other Ambulatory Visit: Payer: Self-pay

## 2019-11-07 ENCOUNTER — Ambulatory Visit (INDEPENDENT_AMBULATORY_CARE_PROVIDER_SITE_OTHER): Payer: Medicare HMO

## 2019-11-07 DIAGNOSIS — R0989 Other specified symptoms and signs involving the circulatory and respiratory systems: Secondary | ICD-10-CM | POA: Diagnosis not present

## 2019-11-07 DIAGNOSIS — R0600 Dyspnea, unspecified: Secondary | ICD-10-CM

## 2019-11-07 DIAGNOSIS — R0609 Other forms of dyspnea: Secondary | ICD-10-CM

## 2019-11-07 LAB — ECHOCARDIOGRAM COMPLETE
AR max vel: 0.99 cm2
AV Area VTI: 0.84 cm2
AV Area mean vel: 0.93 cm2
AV Mean grad: 14 mmHg
AV Peak grad: 24.6 mmHg
Ao pk vel: 2.48 m/s
Area-P 1/2: 3.48 cm2
S' Lateral: 3.1 cm

## 2019-11-12 ENCOUNTER — Other Ambulatory Visit: Payer: Self-pay

## 2019-11-12 DIAGNOSIS — I6523 Occlusion and stenosis of bilateral carotid arteries: Secondary | ICD-10-CM

## 2019-11-18 ENCOUNTER — Telehealth: Payer: Self-pay

## 2019-11-18 DIAGNOSIS — I350 Nonrheumatic aortic (valve) stenosis: Secondary | ICD-10-CM

## 2019-11-18 DIAGNOSIS — Z79899 Other long term (current) drug therapy: Secondary | ICD-10-CM

## 2019-11-18 MED ORDER — FUROSEMIDE 20 MG PO TABS: 20.0000 mg | ORAL_TABLET | Freq: Every day | ORAL | 3 refills | Status: DC

## 2019-11-18 NOTE — Telephone Encounter (Addendum)
-----   Message from Michaelyn Barter, RN sent at 11/15/2019  8:42 AM EDT ----- Will forward to [place] office.      Danny Hector, MD  11/14/2019 5:49 PM EDT     Disagree with reading he has some AS Severe TR and RV dysfunction ok to start 20 mg lasix daily f/u BMET in 3 weeks. Would refer to CHF clinic ? Need for right heat cath / vasodilators    Patient agrees to start Lasix 20 mg daily and to rep[eat BMET in 3 weeks. I have placed referral to CHF clinic and patient agrees to go.

## 2019-11-27 ENCOUNTER — Encounter: Payer: Medicare HMO | Admitting: Vascular Surgery

## 2019-12-02 ENCOUNTER — Encounter: Payer: Self-pay | Admitting: Family

## 2019-12-02 ENCOUNTER — Other Ambulatory Visit: Payer: Self-pay

## 2019-12-02 ENCOUNTER — Ambulatory Visit (INDEPENDENT_AMBULATORY_CARE_PROVIDER_SITE_OTHER): Payer: Medicare HMO | Admitting: Family

## 2019-12-02 VITALS — BP 114/67 | HR 80 | Temp 96.8°F | Ht 74.0 in | Wt 166.8 lb

## 2019-12-02 DIAGNOSIS — Z7901 Long term (current) use of anticoagulants: Secondary | ICD-10-CM

## 2019-12-02 DIAGNOSIS — I480 Paroxysmal atrial fibrillation: Secondary | ICD-10-CM

## 2019-12-02 DIAGNOSIS — I4811 Longstanding persistent atrial fibrillation: Secondary | ICD-10-CM | POA: Diagnosis not present

## 2019-12-02 LAB — COAGUCHEK XS/INR WAIVED
INR: 2.5 — ABNORMAL HIGH (ref 0.9–1.1)
Prothrombin Time: 29.5 s

## 2019-12-02 NOTE — Progress Notes (Signed)
   Subjective:    Patient ID: Danny Loron., male    DOB: 22-Nov-1931, 86 y.o.   MRN: [id number]  Chief Complaint  Patient presents with  . Anticoagulation    HPI PT presents to the office today INR check. He currently takes warfarin for A Fib.    Denies any bleeding, bruising, missed doses, or diet changes.   His INR today is 2.5. He is currently taking 2.5 mg (half tablet) every Thursday, and 5 mg (1 tablet)  all other days.    Review of Systems  All other systems reviewed and are negative.      Objective:   Physical Exam Vitals reviewed.  Constitutional:      General: He is not in acute distress.    Appearance: He is well-developed.  HENT:     Head: Normocephalic.  Eyes:     General:        Right eye: No discharge.        Left eye: No discharge.     Pupils: Pupils are equal, round, and reactive to light.  Neck:     Thyroid: No thyromegaly.  Cardiovascular:     Rate and Rhythm: Normal rate and regular rhythm.     Heart sounds: Normal heart sounds. No murmur heard.   Pulmonary:     Effort: Pulmonary effort is normal. No respiratory distress.     Breath sounds: Normal breath sounds. No wheezing.  Abdominal:     General: Bowel sounds are normal. There is no distension.     Palpations: Abdomen is soft.     Tenderness: There is no abdominal tenderness.  Musculoskeletal:        General: No tenderness. Normal range of motion.     Cervical back: Normal range of motion and neck supple.  Skin:    General: Skin is warm and dry.     Findings: No erythema or rash.  Neurological:     Mental Status: He is alert and oriented to person, place, and time.     Cranial Nerves: No cranial nerve deficit.     Deep Tendon Reflexes: Reflexes are normal and symmetric.  Psychiatric:        Behavior: Behavior normal.        Thought Content: Thought content normal.        Judgment: Judgment normal.     BP 114/67   Pulse 80   Temp (!) 96.8 F (36 C) (Temporal)   Ht 6\' 2"   (1.88 m)   Wt 166 lb 12.8 oz (75.7 kg)   SpO2 100%   BMI 21.42 kg/m        Assessment & Plan:  Danny [name]. comes in today with chief complaint of Anticoagulation   Diagnosis and orders addressed:  1. Long term (current) use of anticoagulants  2. Paroxysmal atrial fibrillation (HCC)  3. Longstanding persistent atrial fibrillation ([place]) Description   INR today 2.5 Perfect! (goal is 2-3).   Continue taking 2.5 mg (half tablet) every Thursday, and 5 mg (1 tablet)  all other days.           Evelina Dun, FNP

## 2019-12-02 NOTE — Patient Instructions (Signed)
Description   INR today 2.5 Perfect! (goal is 2-3).   Continue taking 2.5 mg (half tablet) every Thursday, and 5 mg (1 tablet)  all other days.

## 2019-12-02 NOTE — Addendum Note (Signed)
Addended by: Brynda Peon F on: 12/02/2019 10:37 AM   Modules accepted: Orders

## 2019-12-04 ENCOUNTER — Other Ambulatory Visit: Payer: Self-pay | Admitting: Family Medicine

## 2019-12-04 DIAGNOSIS — K219 Gastro-esophageal reflux disease without esophagitis: Secondary | ICD-10-CM

## 2019-12-09 ENCOUNTER — Other Ambulatory Visit: Payer: Medicare HMO

## 2019-12-09 ENCOUNTER — Other Ambulatory Visit: Payer: Self-pay

## 2019-12-09 DIAGNOSIS — Z79899 Other long term (current) drug therapy: Secondary | ICD-10-CM | POA: Diagnosis not present

## 2019-12-09 NOTE — Addendum Note (Signed)
Addended by: Levonne Hubert on: 12/09/2019 08:27 AM   Modules accepted: Orders

## 2019-12-10 LAB — BASIC METABOLIC PANEL
BUN/Creatinine Ratio: 13 (ref 10–24)
BUN: 13 mg/dL (ref 8–27)
CO2: 25 mmol/L (ref 20–29)
Calcium: 9.7 mg/dL (ref 8.6–10.2)
Chloride: 96 mmol/L (ref 96–106)
Creatinine, Ser: 1.02 mg/dL (ref 0.76–1.27)
GFR calc Af Amer: 77 mL/min/{1.73_m2} (ref >59)
GFR calc non Af Amer: 66 mL/min/{1.73_m2} (ref >59)
Glucose: 95 mg/dL (ref 65–99)
Potassium: 4.5 mmol/L (ref 3.5–5.2)
Sodium: 136 mmol/L (ref 134–144)

## 2020-01-02 ENCOUNTER — Encounter: Payer: Self-pay | Admitting: Family Medicine

## 2020-01-02 ENCOUNTER — Ambulatory Visit (INDEPENDENT_AMBULATORY_CARE_PROVIDER_SITE_OTHER): Payer: Medicare HMO | Admitting: Family Medicine

## 2020-01-02 ENCOUNTER — Other Ambulatory Visit: Payer: Self-pay

## 2020-01-02 VITALS — BP 118/74 | HR 78 | Temp 97.1°F | Ht 74.0 in | Wt 173.5 lb

## 2020-01-02 DIAGNOSIS — I480 Paroxysmal atrial fibrillation: Secondary | ICD-10-CM | POA: Diagnosis not present

## 2020-01-02 DIAGNOSIS — Z7901 Long term (current) use of anticoagulants: Secondary | ICD-10-CM

## 2020-01-02 DIAGNOSIS — I4811 Longstanding persistent atrial fibrillation: Secondary | ICD-10-CM | POA: Diagnosis not present

## 2020-01-02 DIAGNOSIS — I10 Essential (primary) hypertension: Secondary | ICD-10-CM

## 2020-01-02 LAB — COAGUCHEK XS/INR WAIVED
INR: 2.3 — ABNORMAL HIGH (ref 0.9–1.1)
Prothrombin Time: 27.4 s

## 2020-01-02 LAB — POCT INR: INR: 2.3 (ref 2–3)

## 2020-01-02 NOTE — Addendum Note (Signed)
Addended by: Gwenlyn Perking on: 01/02/2020 01:10 PM   Modules accepted: Orders

## 2020-01-02 NOTE — Progress Notes (Signed)
Subjective: CC: INR check PCP: Loman Brooklyn, FNP  DJS:[id number] R Gene Glazebrook. is a 86 y.o. male presenting to clinic today for:  1. INR check Current regimen: 2.5 mg every Thursday, 5 mg all other days   Indication: A.fib Bleeding Signs/Symptoms:  no Thromboembolic Signs/Symptoms:  no   Missed Coumadin Doses: none Medication Changes:  none Dietary Changes:  none  He has a cardiology appointment on 12/20. He has no new concerns today.   Relevant past medical, surgical, family, and social history reviewed and updated as indicated.  Allergies and medications reviewed and updated.  Allergies  Allergen Reactions  . Doxycycline Diarrhea  . Contrast Media [Iodinated Diagnostic Agents]     Syncope  . Lunesta [Eszopiclone]     "felt WILD"  . Morphine And Related Nausea And Vomiting  . Trazodone And Nefazodone Other (See Comments)    Extremely drowsy the next day after taking.   Past Medical History:  Diagnosis Date  . Anxiety   . Atrial fibrillation ([place])   . CAD, ARTERY BYPASS GRAFT 05/19/2008   Qualifier: Diagnosis of  By: Mare Ferrari, [place], Sherri    . Enteritis due to Clostridium difficile, history of 11/21/2012  . GERD (gastroesophageal reflux disease)   . Hyperlipidemia   . Hypertension   . Internal carotid artery stenosis   . 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 Vitamin D deficiency     Current Outpatient Medications:  .  Acetaminophen 500 MG capsule, Take 2 capsules by mouth as needed., Disp: , Rfl:  .  amiodarone (PACERONE) 200 MG tablet, Take 0.5 tablets (100 mg total) by mouth daily., Disp: 45 tablet, Rfl: 3 .  Cholecalciferol (VITAMIN D-3 PO), Take 2,000 mg by mouth every morning. , Disp: , Rfl:  .  furosemide (LASIX) 20 MG tablet, Take 1 tablet (20 mg total) by mouth daily., Disp: 60 tablet, Rfl: 3 .  losartan (COZAAR) 50 MG tablet, TAKE 1 TABLET EVERY MORNING, Disp: 90 tablet, Rfl: 0 .  metoprolol tartrate (LOPRESSOR) 50 MG tablet,  Take 1 tablet (50 mg total) by mouth 2 (two) times daily., Disp: 180 tablet, Rfl: 3 .  mirtazapine (REMERON) 15 MG tablet, Take 1 tablet (15 mg total) by mouth at bedtime., Disp: 30 tablet, Rfl: 0 .  Multiple Vitamin (MULTIVITAMIN) tablet, Take 1 tablet by mouth every morning. , Disp: , Rfl:  .  nitroGLYCERIN (NITROSTAT) 0.4 MG SL tablet, PLACE 1 TABLET (0.4 MG TOTAL) UNDER THE TONGUE EVERY 5 (FIVE) MINUTES AS NEEDED FOR CHEST PAIN., Disp: 25 tablet, Rfl: 3 .  Omega-3 Fatty Acids (FISH OIL) 1000 MG CAPS, Take 2 capsules by mouth every morning., Disp: , Rfl:  .  omeprazole (PRILOSEC) 20 MG capsule, TAKE 1 CAPSULE TWICE DAILY, Disp: 180 capsule, Rfl: 3 .  Probiotic Product (ALIGN) 4 MG CAPS, Take 4 mg by mouth daily as needed. , Disp: , Rfl:  .  rosuvastatin (CRESTOR) 5 MG tablet, Take 1 tablet (5 mg total) by mouth daily., Disp: 30 tablet, Rfl: 3 .  warfarin (COUMADIN) 5 MG tablet, TAKE 1 TABLET EVERY DAY, Disp: 90 tablet, Rfl: 3 Social History   Socioeconomic History  . Marital status: Married    Spouse name: ruby  . Number of children: 1  . Years of education: Not on file  . Highest education level: Not on file  Occupational History  . Occupation: retired    Fish farm manager: [employer]  Tobacco Use  . Smoking status: Former  Smoker    Types: Cigarettes    Start date: 01/24/1950    Quit date: 01/24/1961    Years since quitting: 58.9  . Smokeless tobacco: Never Used  Vaping Use  . Vaping Use: Never used  Substance and Sexual Activity  . Alcohol use: Not Currently    Alcohol/week: 0.0 standard drinks    Comment: not drank since 12/2009  . Drug use: No  . Sexual activity: Yes    Partners: Female  Other Topics Concern  . Not on file  Social History Narrative  . Not on file   Social Determinants of Health   Financial Resource Strain: Not on file  Food Insecurity: Not on file  Transportation Needs: Not on file  Physical Activity: Not on file  Stress: Not on file  Social Connections: Not on  file  Intimate Partner Violence: Not on file   Family History  Problem Relation Age of Onset  . Heart disease Mother   . Stroke Mother   . Stroke Father   . Diabetes Son   . Stroke Son   . Other Sister        ruptured appendix   . Lung cancer Brother        asbestos  . Mental illness Sister   . Kidney disease Sister   . Colon cancer Neg Hx        "not that I know of."    Review of Systems  All other systems are reviewed and are negative.   Objective: Office vital signs reviewed. BP 118/74   Pulse 78   Temp (!) 97.1 F (36.2 C) (Temporal)   Ht 6\' 2"  (1.88 m)   Wt 173 lb 8 oz (78.7 kg)   BMI 22.28 kg/m   Physical Examination:  Physical Exam Vitals and nursing note reviewed.  Constitutional:      General: He is not in acute distress.    Appearance: Normal appearance. He is not ill-appearing, toxic-appearing or diaphoretic.  HENT:     Head: Normocephalic and atraumatic.  Eyes:     Extraocular Movements: Extraocular movements intact.     Pupils: Pupils are equal, round, and reactive to light.  Neck:     Vascular: No carotid bruit.  Cardiovascular:     Rate and Rhythm: Normal rate. Rhythm irregular.     Heart sounds: Normal heart sounds. No murmur heard.   Pulmonary:     Effort: Pulmonary effort is normal. No respiratory distress.     Breath sounds: Normal breath sounds.  Musculoskeletal:     Cervical back: Neck supple. No rigidity or tenderness.     Right lower leg: No edema.     Left lower leg: No edema.  Neurological:     General: No focal deficit present.     Mental Status: He is alert and oriented to person, place, and time.  Psychiatric:        Mood and Affect: Mood normal.        Behavior: Behavior normal.        Thought Content: Thought content normal.        Judgment: Judgment normal.      Results for orders placed or performed in visit on 01/02/20  POCT INR  Result Value Ref Range   INR 2.3 2 - 3     Assessment/ Plan: Caron was seen  today for atrial fibrillation.  Diagnoses and all orders for this visit:  Long term (current) use of anticoagulants INR at goal  today. Keep scheduled appointment with cardiology. INR today 2.3 (goal is 2-3).   Continue taking 2.5 mg (half tablet) every Thursday, and 5 mg (1 tablet)  all other days.   -     POCT INR  Paroxysmal atrial fibrillation (HCC) -     POCT INR  Longstanding persistent atrial fibrillation (HCC) -     POCT INR  Essential hypertension Well controlled on current regimen.   Follow up in 1 month for INR,  The above assessment and management plan was discussed with the patient. The patient verbalized understanding of and has agreed to the management plan. Patient is aware to call the clinic if symptoms persist or worsen. Patient is aware when to return to the clinic for a follow-up visit. Patient educated on when it is appropriate to go to the emergency department.   Marjorie Smolder, FNP-C [facility] [address] [place], [place] [address] [phone]

## 2020-01-02 NOTE — Patient Instructions (Signed)
Bleeding Precautions When on Anticoagulant Therapy, Adult Anticoagulant therapy, also called blood thinner therapy, is medicine that helps to prevent and treat blood clots. The medicine works by stopping blood clots from forming or growing. Blood clots that form in your blood vessels can be dangerous. They can break loose and travel to the heart, lungs, or brain. This increases the risk of a heart attack, stroke, or blocked lung artery (pulmonary embolism). Anticoagulants also increase the risk of bleeding. Try to protect yourself from cuts and other injuries that can cause bleeding. It is important to take anticoagulants exactly as told by your health care provider. Why do I need to be on anticoagulant therapy? You may need this medicine if you are at risk of developing a blood clot. Conditions that increase your risk of a blood clot include:  Being born with heart disease or a heart malformation (congenital heart disease).  Developing heart disease.  Having had surgery, such as valve replacement.  Having had a serious accident or other type of severe injury (trauma).  Having certain types of cancer.  Having certain diseases that can increase blood clotting.  Having a high risk of stroke or heart attack.  Having atrial fibrillation (AF). What are the common anticoagulant medicines? There are several types of anticoagulant medicines. The most common types are:  Medicines that you take by mouth (oral medicines), such as: ? Warfarin. ? Novel oral anticoagulants (NOACs), such as:  Direct thrombin inhibitors (dabigatran).  Factor Xa inhibitors (apixaban, edoxaban, and rivaroxaban).  Injections, such as: ? Unfractionated heparin. ? Low molecular weight heparin. These anticoagulants work in different ways to prevent blood clots. They also have different risks and side effects. What do I need to remember while on anticoagulant therapy? Taking anticoagulants  Take your medicine at the  same time every day. If you forget to take your medicine, take it as soon as you remember. Do not double your dosage of medicine if you miss a whole day. Take your normal dose and call your health care provider.  Do not stop taking your medicine unless your health care provider approves. Stopping the medicine can increase your risk of developing a blood clot. Taking other medicines  Take over-the-counter and prescriptions medicines only as told by your health care provider.  Do not take over-the-counter NSAIDs, including aspirin and ibuprofen, while you are on anticoagulant therapy. These medicines increase your risk of dangerous bleeding.  Get approval from your health care provider before you start taking any new medicines, vitamins, or herbal products. Some of these could interfere with your therapy. General instructions  Keep all follow-up visits as told by your health care provider. This is important.  If you are pregnant or trying to get pregnant, talk with a health care provider about anticoagulants. Some of these medicines are not safe to take during pregnancy.  Tell all health care providers, including your dentist, that you are on anticoagulant therapy. It is especially important to tell providers before you have any surgery, medical procedures, or dental work done. What precautions should I take?   Be very careful when using knives, scissors, or other sharp objects.  Use an electric razor instead of a blade.  Do not use toothpicks.  Use a soft-bristled toothbrush. Brush your teeth gently.  Always wear shoes outdoors and wear slippers indoors.  Be careful when cutting your fingernails and toenails.  Place bath mats in the bathroom. If possible, install handrails as well.  Wear gloves while you do  yard work.  Wear your seat belt.  Prevent falls by removing loose rugs and extension cords from areas where you walk. Use a cane or walker if you need it.  Avoid  constipation by: ? Drinking enough fluid to keep your urine clear or pale yellow. ? Eating foods that are high in fiber, such as fresh fruits and vegetables, whole grains, and beans. ? Limiting foods that are high in fat and processed sugars, such as fried and sweet foods.  Do not play contact sports or participate in other activities that have a high risk for injury. What other precautions are important if on warfarin therapy? If you are taking a type of anticoagulant called warfarin, make sure you:  Work with a diet and nutrition specialist (dietitian) to make an eating plan. Do not make any sudden changes to your diet after you have started your eating plan.  Do not drink alcohol. It can interfere with your medicine and increase your risk of an injury that causes bleeding.  Get regular blood tests as told by your health care provider. What are some questions to ask my health care provider?  Why do I need anticoagulant therapy?  What is the best anticoagulant therapy for my condition?  How long will I need anticoagulant therapy?  What are the side effects of anticoagulant therapy?  When should I take my medicine? What should I do if I forget to take it?  Will I need to have regular blood tests?  Do I need to change my diet? Are there foods or drinks that I should avoid?  What activities are safe for me?  What should I do if I want to get pregnant? Contact a health care provider if:  You miss a dose of medicine: ? And you are not sure what to do. ? For more than one day.  You have: ? Menstrual bleeding that is heavier than normal. ? Bloody or brown urine. ? Easy bruising. ? Black and tarry stool or bright red stool. ? Side effects from your medicine.  You feel weak or dizzy.  You become pregnant. Get help right away if:  You have bleeding that will not stop within 20 minutes from: ? The nose. ? The gums. ? A cut on the skin.  You have a severe headache or  stomachache.  You vomit or cough up blood.  You fall or hit your head. Summary  Anticoagulant therapy, also called blood thinner therapy, is medicine that helps to prevent and treat blood clots.  Anticoagulants work in different ways to prevent blood clots. They also have different risks and side effects.  Talk with your health care provider about any precautions that you should take while on anticoagulant therapy. This information is not intended to replace advice given to you by your health care provider. Make sure you discuss any questions you have with your health care provider. Document Revised: 05/02/2018 Document Reviewed: 03/29/2016 Elsevier Patient Education  [place].

## 2020-01-03 NOTE — H&P (View-Only) (Signed)
Cardiology Office Note   Date:  01/13/2020   ID:  Danny Flannagan., DOB 02-06-1931, MRN [id number]  PCP:  Loman Brooklyn, FNP Cardiologist:  [name], MD 02/01/2019 tele-visit Electrphysiologist: None [name], MD   No chief complaint on file.   History of Present Illness: Danny Candee. is a 86 y.o. male with a history of  PVD s/p L CEA w/ R subclavian bpg 2009 and PTCA 2010, CAD s/p CABG 2003 w/ LIMA-LAD, RIMA-RI, SVG-OM3, PAF on coumadin, HTN, HLD, and GERD INR;s have been Rx with no bleeding issues BP is well controlled   Deferred DOAC due to cost  Had dizziness with amiodarone resolved on lower dose of 200 mg daily   Declined [facility] appointment 11/05/19 and decided to stay On amiodarone for rate control since he felt better   TTE done 11/07/19 with EF 50-55% Mild LAE Severe RAE and RV dysfunction with severe TR Estimated PA around 60 mmHg Mild AS with mean gradient 14 peak 25 mmHg with low DVI 0.2   Duplex 11/07/19 with right ICA stenosis 80-99% but patent CCA to subclavian graft and plaque in LICA   Discussed right and left heart cath with him He would like to defer as wife fell and broke her hip  And is still in [facility].   He indicates not drinking alcohol. He will need indwelling foley for any cath  COVID status: vaccinated, did not have COVID Past Medical History:  Diagnosis Date  . Anxiety   . Atrial fibrillation ([place])   . CAD, ARTERY BYPASS GRAFT 05/19/2008   Qualifier: Diagnosis of  By: Mare Ferrari, [place], Sherri    . Enteritis due to Clostridium difficile, history of 11/21/2012  . GERD (gastroesophageal reflux disease)   . Hyperlipidemia   . Hypertension   . Internal carotid artery stenosis   . 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 Vitamin D deficiency     Past Surgical History:  Procedure Laterality Date  . CARDIOVERSION N/A 05/05/2016   Procedure: CARDIOVERSION;  Surgeon: Josue Hector, MD;   Location: [facility];  Service: Cardiovascular;  Laterality: N/A;  . CAROTID ENDARTERECTOMY Left 2003  . CAROTID-SUBCLAVIAN BYPASS GRAFT Right   . COLONOSCOPY  2005   hyperplastic polyp x 1 with no adenoma  . COLONOSCOPY N/A 04/11/2014   [id number] diverticulosis in the sigmoid colon/left colon is redundant  . CORONARY ARTERY BYPASS GRAFT    . ESOPHAGEAL DILATION N/A 04/11/2014   Procedure: ESOPHAGEAL DILATION;  Surgeon: [name], MD;  [facility];  Service: Endoscopy;  Laterality: N/A;  . ESOPHAGOGASTRODUODENOSCOPY N/A 04/11/2014   [id number] at the gastro junction/moderate erosive/duodenal web  . EYE SURGERY Bilateral    cataracts  . SKIN CANCER EXCISION      Current Outpatient Medications  Medication Sig Dispense Refill  . Acetaminophen 500 MG capsule Take 2 capsules by mouth as needed.    [name] amiodarone (PACERONE) 200 MG tablet Take 0.5 tablets (100 mg total) by mouth daily. 45 tablet 3  . Cholecalciferol (VITAMIN D-3 PO) Take 2,000 mg by mouth every morning.     . furosemide (LASIX) 20 MG tablet Take 1 tablet (20 mg total) by mouth daily. 60 tablet 3  . losartan (COZAAR) 50 MG tablet TAKE 1 TABLET EVERY MORNING 90 tablet 0  . metoprolol tartrate (LOPRESSOR) 50 MG tablet Take 1 tablet (50 mg total) by mouth 2 (two) times  daily. 180 tablet 3  . mirtazapine (REMERON) 15 MG tablet Take 1 tablet (15 mg total) by mouth at bedtime. 30 tablet 0  . Multiple Vitamin (MULTIVITAMIN) tablet Take 1 tablet by mouth every morning.     . nitroGLYCERIN (NITROSTAT) 0.4 MG SL tablet PLACE 1 TABLET (0.4 MG TOTAL) UNDER THE TONGUE EVERY 5 (FIVE) MINUTES AS NEEDED FOR CHEST PAIN. 25 tablet 3  . Omega-3 Fatty Acids (FISH OIL) 1000 MG CAPS Take 2 capsules by mouth every morning.    [name] omeprazole (PRILOSEC) 20 MG capsule TAKE 1 CAPSULE TWICE DAILY 180 capsule 3  . Probiotic Product (ALIGN) 4 MG CAPS Take 4 mg by mouth daily as needed.     . rosuvastatin (CRESTOR) 5 MG tablet Take 1 tablet (5  mg total) by mouth daily. 30 tablet 3  . warfarin (COUMADIN) 5 MG tablet TAKE 1 TABLET EVERY DAY 90 tablet 3   No current facility-administered medications for this visit.    Allergies:   Doxycycline, Contrast media [iodinated diagnostic agents], Lunesta [eszopiclone], Morphine and related, and Trazodone and nefazodone    Social History:  The patient  reports that he quit smoking about 59 years ago. His smoking use included cigarettes. He started smoking about 70 years ago. He has never used smokeless tobacco. He reports previous alcohol use. He reports that he does not use drugs.   Family History:  The patient's family history includes Diabetes in his son; Heart disease in his mother; Kidney disease in his sister; Lung cancer in his brother; Mental illness in his sister; Other in his sister; Stroke in his father, mother, and son.  He indicated that his mother is deceased. He indicated that his father is deceased. He indicated that all of his three sisters are deceased. He indicated that his brother is deceased. He indicated that his maternal grandmother is deceased. He indicated that his maternal grandfather is deceased. He indicated that his paternal grandmother is deceased. He indicated that his paternal grandfather is deceased. He indicated that his son is alive. He indicated that the status of his neg hx is unknown.   ROS:  Please see the history of present illness. All other systems are reviewed and negative.    PHYSICAL EXAM: VS:  BP 134/76   Pulse 86   Ht 6\' 2"  (1.88 m)   Wt 80.7 kg   SpO2 100%   BMI 22.85 kg/m  , BMI Body mass index is 22.85 kg/m.  Affect appropriate Healthy:  appears stated age 28: normal Neck supple with no adenopathy JVP elevated bilateral  bruits no thyromegaly Lungs clear with no wheezing and good diaphragmatic motion Heart:  S1/S2 AS  murmur, no rub, gallop or click PMI normal Abdomen: benighn, BS positve, no tenderness, no AAA no bruit.  No HSM  or HJR Distal pulses intact with no bruits No edema Neuro non-focal Skin warm and dry No muscular weakness    EKG:   Afib rate 85 poor R wave progression 11/05/19   ECHO: 11/07/19 1. Left ventricular ejection fraction, by estimation, is 50 to 55%. The  left ventricle has low normal function. The left ventricle has no regional  wall motion abnormalities. Left ventricular diastolic parameters are  indeterminate.  2. Ventricular septum is flattened in diastole suggesting RV volume  overload. . Right ventricular systolic function is mildly reduced. The  right ventricular size is severely enlarged.  3. Left atrial size was mildly dilated.  4. Right atrial size was severely dilated.  5. The mitral valve is normal in structure. Trivial mitral valve  regurgitation. No evidence of mitral stenosis.  6. Tricuspid valve regurgitation is severe.  7. The aortic valve has an indeterminant number of cusps. There is  moderate calcification of the aortic valve. There is moderate thickening  of the aortic valve. Aortic valve regurgitation is not visualized. No  aortic stenosis is present.  8. Moderate pulmonary HTN, PASP is 46 mmHg.  9. The inferior vena cava is dilated in size with >50% respiratory  variability, suggesting right atrial pressure of 8 mmHg.   CATH: 07/25/2001 It became obvious as soon as we placed the Judkins left 4 catheter in the patients aortic root that there was severe damping to the left main coronary artery only limited injections could be done as the patient became significantly bradycardic and hypotensive.  Flush injections revealed a 95% left main stenosis.  We did take two injections of the left main with pullback on the catheter to reveal the distal vessels.  The LAD appeared to be patent with two diagonal branches of medium size.  The circumflex system appeared to consist mainly of one AV groove branch and an OM branch.  The OM branch had 50%  multiple discrete lesions in it.  The distal left main also had a 50% tubular stenosis.  Imaging of the right coronary artery showed a 30% distal lesion.  Interestingly, there were right to left collaterals to the LAD which suggested the left main was critically tight requiring collateralization of the LAD.  RAO ventriculography revealed normal LV function with an EF of 60%.  There was no gradient across the aortic valve and no MR.  Aortic pressure was in the 180/70 range with LV pressure in the 180/28 range.  Left subclavian and internal mammary artery projections shows it to be widely patent and a good vessel for bypass.  IMPRESSION:  The patient will be started on oxygen as well as heparin and nitroglycerin.  CVTS has been called and hopefully we can operate on the patient within the next 24 to 36 hours. Dictated by:   Wallis Bamberg Johnsie Cancel, M.D. [facility]  CAROTID AND SUBCLAVIAN DOPPLERS: 07/28/2017 Final Interpretation:  Right Carotid: Velocities in the right ICA are consistent with a 40-59%         stenosis. The ECA appears >50% stenosed.  Right Graft:(s) Patent right CCA to subclavian artery bypass graft, with  severely elevated velocities at the CCA anastomosis, and elevated  velocities throughout.    Left Carotid: Velocities in the left ICA are consistent with a 1-39%  stenosis.   Vertebrals: Bilateral vertebral arteries demonstrate antegrade flow.  Subclavians: Right subclavian artery flow was disturbed. Normal flow        hemodynamics were seen in the left subclavian artery.   SUBCLAVIAN Right: No significant arterial obstruction detected in the right     upper extremity.  Left: No significant arterial obstruction detected in the left upper    extremity.   MYOVIEW: 08/01/2006 STRESS MYOVIEW STUDY:  Patient exercised for 5 minutes on the standard Bruce protocol. Exercise was stopped somewhat abruptly due to acute shortness of breath. The patient  had a millimeter and a half of ST segment depression in the inferior lateral leads. He had frequent PACs and PVCs. We managed to run the treadmill at a low level speed for another 30 seconds to make sure the Myoview circulated. His baseline EKG showed sinus rhythm with nonspecific ST-T wave changes and borderline LVH. There  were flat ST segments in the inferior lateral leads; however, he did have an additional 2 mm of ST segment depression and T-wave inversions in the inferior lateral leads with stress.   Overall, the EKG was thought to be nondiagnostic due to baseline changes.   The patient's baseline heart rate was 65. His blood pressure is 162/78 in the left arm. In the right arm, it was 109/72 consistent with his known subclavian disease.   With stress, the heart rate increased to a maximum of 133 and blood pressure was 210/82.   SCINTIGRAPHIC RESULTS: Images were reconstructed in the short axis, vertical, and horizontal long axis. Resting and stress images were normal. There is no evidence of ischemia or infarction. The gated ejection fraction was 64%.  IMPRESSION:  1. Clinically abnormal stress test with acute onset of shortness of breath, abnormal EKG; however, scintigraphic images are low risk with no evidence of ischemia or infarction. The gated ejection fraction is 64%. The patient will be followed closely clinically. He is not having significant pain.   2. There was also no evidence of LV cavity dilatation with stress to suggest balanced ischemia.    Recent Labs: 02/20/2019: Hemoglobin 15.2; Platelets 239 10/14/2019: ALT 32; TSH 0.969 12/09/2019: BUN 13; Creatinine, Ser 1.02; Potassium 4.5; Sodium 136  CBC    Component Value Date/Time   WBC 6.8 02/20/2019 0815   WBC 6.6 04/25/2016 1303   RBC 4.79 02/20/2019 0815   RBC 4.76 04/25/2016 1303   HGB 15.2 02/20/2019 0815   HCT 45.5 02/20/2019 0815   PLT 239 02/20/2019 0815   MCV 95 02/20/2019 0815   MCH 31.7 02/20/2019 0815   MCH  31.3 04/25/2016 1303   MCHC 33.4 02/20/2019 0815   MCHC 34.3 04/25/2016 1303   RDW 12.9 02/20/2019 0815   LYMPHSABS 1.4 02/20/2019 0815   MONOABS 1.8 (H) 09/19/2012 1915   EOSABS 0.2 02/20/2019 0815   BASOSABS 0.1 02/20/2019 0815   CMP Latest Ref Rng & Units 12/09/2019 10/14/2019 02/20/2019  Glucose 65 - 99 mg/dL 95 107(H) 94  BUN 8 - 27 mg/dL 13 9 10   Creatinine 0.76 - 1.27 mg/dL 1.02 0.88 0.85  Sodium 134 - 144 mmol/L 136 130(L) 136  Potassium 3.5 - 5.2 mmol/L 4.5 4.9 4.2  Chloride 96 - 106 mmol/L 96 96(L) 97  CO2 20 - 29 mmol/L 25 23 26   Calcium 8.6 - 10.2 mg/dL 9.7 9.3 9.2  Total Protein 6.5 - 8.1 g/dL - 7.0 6.7  Total Bilirubin 0.3 - 1.2 mg/dL - 0.9 0.7  Alkaline Phos 38 - 126 U/L - 110 106  AST 15 - 41 U/L - 30 20  ALT 0 - 44 U/L - 32 16   Lab Results  Component Value Date   INR 2.3 (H) 01/02/2020   INR 2.3 01/02/2020   INR 2.5 (H) 12/02/2019    Lipid Panel Lab Results  Component Value Date   CHOL 170 02/20/2019   HDL 87 02/20/2019   LDLCALC 71 02/20/2019   TRIG 60 02/20/2019   CHOLHDL 2.0 02/20/2019      Wt Readings from Last 3 Encounters:  01/13/20 80.7 kg  01/02/20 78.7 kg  12/02/19 75.7 kg     Other studies Reviewed: Additional studies/ records that were reviewed today include: Office notes, hospital records and testing.  ASSESSMENT AND PLAN:  1.  DOE:  - EF 50-55% by TTE 10/14 significant RV failure with elevated PA presures Discussed having right and left Cath which he  is willing to do but he wants to wait until his wife is out of hospital /rehab at [facility]  2.  Persistent atrial fibrillation -chronic on anticoagulation rate control ok will need coumadin held for heart cath  - dizziness with higher dose amiodarone tolerating 100 mg daily  Continue lopressor 50 bid His INR is checked monthly at [facility]  3.  Hypertension -continue losartan and beta blocker as well as lasix   4.  Carotid disease, history of subclavian disease -Duplex 11/07/19 with  patent right CCA to subclavian bypass and plaque in L ICA   F/U 6-8 weeks    Disposition:   FU with [name], MD  Signed, [name], MD  01/13/2020 8:42 AM    [place] Phone: [phone]; Fax: [phone]

## 2020-01-03 NOTE — Progress Notes (Signed)
Cardiology Office Note   Date:  01/13/2020   ID:  Danny Kagel., DOB 1931-04-17, MRN [id number]  PCP:  Loman Brooklyn, FNP Cardiologist:  [name], MD 02/01/2019 tele-visit Electrphysiologist: None [name], MD   No chief complaint on file.   History of Present Illness: Danny Ackers. is a 86 y.o. male with a history of  PVD s/p L CEA w/ R subclavian bpg 2009 and PTCA 2010, CAD s/p CABG 2003 w/ LIMA-LAD, RIMA-RI, SVG-OM3, PAF on coumadin, HTN, HLD, and GERD INR;s have been Rx with no bleeding issues BP is well controlled   Deferred DOAC due to cost  Had dizziness with amiodarone resolved on lower dose of 200 mg daily   Declined [place] at [facility] appointment 11/05/19 and decided to stay On amiodarone for rate control since he felt better   TTE done 11/07/19 with EF 50-55% Mild LAE Severe RAE and RV dysfunction with severe TR Estimated PA around 60 mmHg Mild AS with mean gradient 14 peak 25 mmHg with low DVI 0.2   Duplex 11/07/19 with right ICA stenosis 80-99% but patent CCA to subclavian graft and plaque in LICA   Discussed right and left heart cath with him He would like to defer as wife fell and broke her hip  And is still in [facility].   He indicates not drinking alcohol. He will need indwelling foley for any cath  COVID status: vaccinated, did not have COVID Past Medical History:  Diagnosis Date  . Anxiety   . Atrial fibrillation ([place])   . CAD, ARTERY BYPASS GRAFT 05/19/2008   Qualifier: Diagnosis of  By: Mare Ferrari, Elma, Sherri    . Enteritis due to Clostridium difficile, history of 11/21/2012  . GERD (gastroesophageal reflux disease)   . Hyperlipidemia   . Hypertension   . Internal carotid artery stenosis   . 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 Vitamin D deficiency     Past Surgical History:  Procedure Laterality Date  . CARDIOVERSION N/A 05/05/2016   Procedure: CARDIOVERSION;  Surgeon: Josue Hector, MD;   Location: [facility];  Service: Cardiovascular;  Laterality: N/A;  . CAROTID ENDARTERECTOMY Left 2003  . CAROTID-SUBCLAVIAN BYPASS GRAFT Right   . COLONOSCOPY  2005   hyperplastic polyp x 1 with no adenoma  . COLONOSCOPY N/A 04/11/2014   BTD:HRCB diverticulosis in Danny sigmoid colon/left colon is redundant  . CORONARY ARTERY BYPASS GRAFT    . ESOPHAGEAL DILATION N/A 04/11/2014   Procedure: ESOPHAGEAL DILATION;  Surgeon: [name], MD;  [facility];  Service: Endoscopy;  Laterality: N/A;  . ESOPHAGOGASTRODUODENOSCOPY N/A 04/11/2014   ULA:[id number] at Danny gastro junction/moderate erosive/duodenal web  . EYE SURGERY Bilateral    cataracts  . SKIN CANCER EXCISION      Current Outpatient Medications  Medication Sig Dispense Refill  . Acetaminophen 500 MG capsule Take 2 capsules by mouth as needed.    [name] amiodarone (PACERONE) 200 MG tablet Take 0.5 tablets (100 mg total) by mouth daily. 45 tablet 3  . Cholecalciferol (VITAMIN D-3 PO) Take 2,000 mg by mouth every morning.     . furosemide (LASIX) 20 MG tablet Take 1 tablet (20 mg total) by mouth daily. 60 tablet 3  . losartan (COZAAR) 50 MG tablet TAKE 1 TABLET EVERY MORNING 90 tablet 0  . metoprolol tartrate (LOPRESSOR) 50 MG tablet Take 1 tablet (50 mg total) by mouth 2 (two) times  daily. 180 tablet 3  . mirtazapine (REMERON) 15 MG tablet Take 1 tablet (15 mg total) by mouth at bedtime. 30 tablet 0  . Multiple Vitamin (MULTIVITAMIN) tablet Take 1 tablet by mouth every morning.     . nitroGLYCERIN (NITROSTAT) 0.4 MG SL tablet PLACE 1 TABLET (0.4 MG TOTAL) UNDER Danny TONGUE EVERY 5 (FIVE) MINUTES AS NEEDED FOR CHEST PAIN. 25 tablet 3  . Omega-3 Fatty Acids (FISH OIL) 1000 MG CAPS Take 2 capsules by mouth every morning.    [name] omeprazole (PRILOSEC) 20 MG capsule TAKE 1 CAPSULE TWICE DAILY 180 capsule 3  . Probiotic Product (ALIGN) 4 MG CAPS Take 4 mg by mouth daily as needed.     . rosuvastatin (CRESTOR) 5 MG tablet Take 1 tablet (5  mg total) by mouth daily. 30 tablet 3  . warfarin (COUMADIN) 5 MG tablet TAKE 1 TABLET EVERY DAY 90 tablet 3   No current facility-administered medications for this visit.    Allergies:   Doxycycline, Contrast media [iodinated diagnostic agents], Lunesta [eszopiclone], Morphine and related, and Trazodone and nefazodone    Social History:  Danny [name]  reports that he quit smoking about 59 years ago. His smoking use included cigarettes. He started smoking about 70 years ago. He has never used smokeless tobacco. He reports previous alcohol use. He reports that he does not use drugs.   Family History:  Danny [name]'s family history includes Diabetes in his son; Heart disease in his mother; Kidney disease in his sister; Lung cancer in his brother; Mental illness in his sister; Other in his sister; Stroke in his father, mother, and son.  He indicated that his mother is deceased. He indicated that his father is deceased. He indicated that all of his three sisters are deceased. He indicated that his brother is deceased. He indicated that his maternal grandmother is deceased. He indicated that his maternal grandfather is deceased. He indicated that his paternal grandmother is deceased. He indicated that his paternal grandfather is deceased. He indicated that his son is alive. He indicated that Danny status of his neg hx is unknown.   ROS:  Please see Danny history of present illness. All other systems are reviewed and negative.    PHYSICAL EXAM: VS:  BP 134/76   Pulse 86   Ht 6\' 2"  (1.88 m)   Wt 80.7 kg   SpO2 100%   BMI 22.85 kg/m  , BMI Body mass index is 22.85 kg/m.  Affect appropriate Healthy:  appears stated age 76: normal Neck supple with no adenopathy JVP elevated bilateral  bruits no thyromegaly Lungs clear with no wheezing and good diaphragmatic motion Heart:  S1/S2 AS  murmur, no rub, gallop or click PMI normal Abdomen: benighn, BS positve, no tenderness, no AAA no bruit.  No HSM  or HJR Distal pulses intact with no bruits No edema Neuro non-focal Skin warm and dry No muscular weakness    EKG:   Afib rate 85 poor R wave progression 11/05/19   ECHO: 11/07/19 1. Left ventricular ejection fraction, by estimation, is 50 to 55%. Danny  left ventricle has low normal function. Danny left ventricle has no regional  wall motion abnormalities. Left ventricular diastolic parameters are  indeterminate.  2. Ventricular septum is flattened in diastole suggesting RV volume  overload. . Right ventricular systolic function is mildly reduced. Danny  right ventricular size is severely enlarged.  3. Left atrial size was mildly dilated.  4. Right atrial size was severely dilated.  5. Danny mitral valve is normal in structure. Trivial mitral valve  regurgitation. No evidence of mitral stenosis.  6. Tricuspid valve regurgitation is severe.  7. Danny aortic valve has an indeterminant number of cusps. There is  moderate calcification of Danny aortic valve. There is moderate thickening  of Danny aortic valve. Aortic valve regurgitation is not visualized. No  aortic stenosis is present.  8. Moderate pulmonary HTN, PASP is 46 mmHg.  9. Danny inferior vena cava is dilated in size with >50% respiratory  variability, suggesting right atrial pressure of 8 mmHg.   CATH: 07/25/2001 It became obvious as soon as we placed Danny Judkins left 4 catheter in Danny patients aortic root that there was severe damping to Danny left main coronary artery only limited injections could be done as Danny [name] became significantly bradycardic and hypotensive.  Flush injections revealed a 95% left main stenosis.  We did take two injections of Danny left main with pullback on Danny catheter to reveal Danny distal vessels.  Danny LAD appeared to be patent with two diagonal branches of medium size.  Danny circumflex system appeared to consist mainly of one AV groove branch and an OM branch.  Danny OM branch had 50%  multiple discrete lesions in it.  Danny distal left main also had a 50% tubular stenosis.  Imaging of Danny right coronary artery showed a 30% distal lesion.  Interestingly, there were right to left collaterals to Danny LAD which suggested Danny left main was critically tight requiring collateralization of Danny LAD.  RAO ventriculography revealed normal LV function with an EF of 60%.  There was no gradient across Danny aortic valve and no MR.  Aortic pressure was in Danny 180/70 range with LV pressure in Danny 180/28 range.  Left subclavian and internal mammary artery projections shows it to be widely patent and a good vessel for bypass.  IMPRESSION:  Danny [name] will be started on oxygen as well as heparin and nitroglycerin.  CVTS has been called and hopefully we can operate on Danny [name] within Danny next 24 to 36 hours. Dictated by:   Wallis Bamberg Johnsie Cancel, M.D. [facility]  CAROTID AND SUBCLAVIAN DOPPLERS: 07/28/2017 Final Interpretation:  Right Carotid: Velocities in Danny right ICA are consistent with a 40-59%         stenosis. Danny ECA appears >50% stenosed.  Right Graft:(s) Patent right CCA to subclavian artery bypass graft, with  severely elevated velocities at Danny CCA anastomosis, and elevated  velocities throughout.    Left Carotid: Velocities in Danny left ICA are consistent with a 1-39%  stenosis.   Vertebrals: Bilateral vertebral arteries demonstrate antegrade flow.  Subclavians: Right subclavian artery flow was disturbed. Normal flow        hemodynamics were seen in Danny left subclavian artery.   SUBCLAVIAN Right: No significant arterial obstruction detected in Danny right     upper extremity.  Left: No significant arterial obstruction detected in Danny left upper    extremity.   MYOVIEW: 08/01/2006 STRESS MYOVIEW STUDY:  [name] exercised for 5 minutes on Danny standard Bruce protocol. Exercise was stopped somewhat abruptly due to acute shortness of breath. Danny [name]  had a millimeter and a half of ST segment depression in Danny inferior lateral leads. He had frequent PACs and PVCs. We managed to run Danny treadmill at a low level speed for another 30 seconds to make sure Danny Myoview circulated. His baseline EKG showed sinus rhythm with nonspecific ST-T wave changes and borderline LVH. There  were flat ST segments in Danny inferior lateral leads; however, he did have an additional 2 mm of ST segment depression and T-wave inversions in Danny inferior lateral leads with stress.   Overall, Danny EKG was thought to be nondiagnostic due to baseline changes.   Danny [name]'s baseline heart rate was 65. His blood pressure is 162/78 in Danny left arm. In Danny right arm, it was 109/72 consistent with his known subclavian disease.   With stress, Danny heart rate increased to a maximum of 133 and blood pressure was 210/82.   SCINTIGRAPHIC RESULTS: Images were reconstructed in Danny short axis, vertical, and horizontal long axis. Resting and stress images were normal. There is no evidence of ischemia or infarction. Danny gated ejection fraction was 64%.  IMPRESSION:  1. Clinically abnormal stress test with acute onset of shortness of breath, abnormal EKG; however, scintigraphic images are low risk with no evidence of ischemia or infarction. Danny gated ejection fraction is 64%. Danny [name] will be followed closely clinically. He is not having significant pain.   2. There was also no evidence of LV cavity dilatation with stress to suggest balanced ischemia.    Recent Labs: 02/20/2019: Hemoglobin 15.2; Platelets 239 10/14/2019: ALT 32; TSH 0.969 12/09/2019: BUN 13; Creatinine, Ser 1.02; Potassium 4.5; Sodium 136  CBC    Component Value Date/Time   WBC 6.8 02/20/2019 0815   WBC 6.6 04/25/2016 1303   RBC 4.79 02/20/2019 0815   RBC 4.76 04/25/2016 1303   HGB 15.2 02/20/2019 0815   HCT 45.5 02/20/2019 0815   PLT 239 02/20/2019 0815   MCV 95 02/20/2019 0815   MCH 31.7 02/20/2019 0815   MCH  31.3 04/25/2016 1303   MCHC 33.4 02/20/2019 0815   MCHC 34.3 04/25/2016 1303   RDW 12.9 02/20/2019 0815   LYMPHSABS 1.4 02/20/2019 0815   MONOABS 1.8 (H) 09/19/2012 1915   EOSABS 0.2 02/20/2019 0815   BASOSABS 0.1 02/20/2019 0815   CMP Latest Ref Rng & Units 12/09/2019 10/14/2019 02/20/2019  Glucose 65 - 99 mg/dL 95 107(H) 94  BUN 8 - 27 mg/dL 13 9 10   Creatinine 0.76 - 1.27 mg/dL 1.02 0.88 0.85  Sodium 134 - 144 mmol/L 136 130(L) 136  Potassium 3.5 - 5.2 mmol/L 4.5 4.9 4.2  Chloride 96 - 106 mmol/L 96 96(L) 97  CO2 20 - 29 mmol/L 25 23 26   Calcium 8.6 - 10.2 mg/dL 9.7 9.3 9.2  Total Protein 6.5 - 8.1 g/dL - 7.0 6.7  Total Bilirubin 0.3 - 1.2 mg/dL - 0.9 0.7  Alkaline Phos 38 - 126 U/L - 110 106  AST 15 - 41 U/L - 30 20  ALT 0 - 44 U/L - 32 16   Lab Results  Component Value Date   INR 2.3 (H) 01/02/2020   INR 2.3 01/02/2020   INR 2.5 (H) 12/02/2019    Lipid Panel Lab Results  Component Value Date   CHOL 170 02/20/2019   HDL 87 02/20/2019   LDLCALC 71 02/20/2019   TRIG 60 02/20/2019   CHOLHDL 2.0 02/20/2019      Wt Readings from Last 3 Encounters:  01/13/20 80.7 kg  01/02/20 78.7 kg  12/02/19 75.7 kg     Other studies Reviewed: Additional studies/ records that were reviewed today include: Office notes, hospital records and testing.  ASSESSMENT AND PLAN:  1.  DOE:  - EF 50-55% by TTE 10/14 significant RV failure with elevated PA presures Discussed having right and left Cath which he  is willing to do but he wants to wait until his wife is out of hospital /rehab at [facility]  2.  Persistent atrial fibrillation -chronic on anticoagulation rate control ok will need coumadin held for heart cath  - dizziness with higher dose amiodarone tolerating 100 mg daily  Continue lopressor 50 bid His INR is checked [facility] at [facility]  3.  Hypertension -continue losartan and beta blocker as well as lasix   4.  Carotid disease, history of subclavian disease -Duplex 11/07/19 with  patent right CCA to subclavian bypass and plaque in L ICA   F/U 6-8 weeks    Disposition:   FU with [name], MD  Signed, [name], MD  01/13/2020 8:42 AM    [place] Phone: [phone]; Fax: [phone]

## 2020-01-13 ENCOUNTER — Ambulatory Visit: Payer: Medicare HMO | Admitting: Cardiovascular Disease

## 2020-01-13 ENCOUNTER — Encounter: Payer: Self-pay | Admitting: Cardiovascular Disease

## 2020-01-13 ENCOUNTER — Other Ambulatory Visit: Payer: Self-pay

## 2020-01-13 ENCOUNTER — Telehealth: Payer: Self-pay | Admitting: Cardiovascular Disease

## 2020-01-13 VITALS — BP 134/76 | HR 86 | Ht 74.0 in | Wt 178.0 lb

## 2020-01-13 DIAGNOSIS — Z01810 Encounter for preprocedural cardiovascular examination: Secondary | ICD-10-CM

## 2020-01-13 DIAGNOSIS — Z01818 Encounter for other preprocedural examination: Secondary | ICD-10-CM

## 2020-01-13 DIAGNOSIS — Z951 Presence of aortocoronary bypass graft: Secondary | ICD-10-CM | POA: Diagnosis not present

## 2020-01-13 DIAGNOSIS — I272 Pulmonary hypertension, unspecified: Secondary | ICD-10-CM

## 2020-01-13 MED ORDER — NITROGLYCERIN 0.4 MG SL SUBL: SUBLINGUAL_TABLET | SUBLINGUAL | 3 refills | Status: DC

## 2020-01-13 NOTE — Telephone Encounter (Signed)
Refilled NTG to [facility] as requested

## 2020-01-13 NOTE — Patient Instructions (Signed)
Medication Instructions:  Your physician recommends that you continue on your current medications as directed. Please refer to the Current Medication list given to you today.  *If you need a refill on your cardiac medications before your next appointment, please call your pharmacy*   Lab Work: NONE   If you have labs (blood work) drawn today and your tests are completely normal, you will receive your results only by:  [place] (if you have MyChart) OR  A paper copy in the mail If you have any lab test that is abnormal or we need to change your treatment, we will call you to review the results.   Testing/Procedures: NONE    Follow-Up: At [facility], you and your health needs are our priority.  As part of our continuing mission to provide you with exceptional heart care, we have created designated Provider Care Teams.  These Care Teams include your primary Cardiologist (physician) and Advanced Practice Providers (APPs -  Physician Assistants and Nurse Practitioners) who all work together to provide you with the care you need, when you need it.  We recommend signing up for the patient portal called "MyChart".  Sign up information is provided on this After Visit Summary.  MyChart is used to connect with patients for Virtual Visits (Telemedicine).  Patients are able to view lab/test results, encounter notes, upcoming appointments, etc.  Non-urgent messages can be sent to your provider as well.   To learn more about what you can do with MyChart, go to [email or web address].    Your next appointment:   6-8 week(s)  The format for your next appointment:   In Person  Provider:   [name], MD   Other Instructions Thank you for choosing [facility]!  [name]

## 2020-01-13 NOTE — Telephone Encounter (Signed)
    1. Which medications need to be refilled? (please list name of each medication and dose if known) Nitroglycerin 0.4  2. Which pharmacy/location (including street and city if local pharmacy) is medication to be sent to? [facility] pharmacy Mail Delivery   3. Do they need a 30 day or 90 day supply?

## 2020-01-21 ENCOUNTER — Telehealth: Payer: Self-pay | Admitting: Cardiovascular Disease

## 2020-01-21 NOTE — Telephone Encounter (Signed)
New message    Patient wants to proceed with the Heart Cath does not want to wait until February

## 2020-01-22 ENCOUNTER — Telehealth: Payer: Self-pay

## 2020-01-22 NOTE — Telephone Encounter (Signed)
Patient states that he has been having bilateral leg swelling that has been going on x 1 week.  No SOB or chest pain.  We have no openings on 12/30 and we are closed 12/31.  Advised patient to go to urgent care for evaluation- patient verbalizes understanding.

## 2020-01-27 NOTE — Telephone Encounter (Signed)
Called to ask pt when he would like to schedule heart cath. Pt states that his wife is still in rehab and that he will have to call office back to schedule.

## 2020-01-30 ENCOUNTER — Telehealth: Payer: Self-pay | Admitting: Cardiovascular Disease

## 2020-01-30 NOTE — Telephone Encounter (Signed)
Patient called stating that he is ready to proceed with his heart cath. He is requesting to be called on his called on his cell phone.

## 2020-02-03 ENCOUNTER — Ambulatory Visit: Payer: Medicare HMO | Admitting: Family Medicine

## 2020-02-03 NOTE — Telephone Encounter (Signed)
Spoke with pt explained that he could have cath this week, and that he would need covid testing. Pt states that his wife is in the nursing home and he would call office back.

## 2020-02-05 ENCOUNTER — Other Ambulatory Visit: Payer: Self-pay | Admitting: Cardiovascular Disease

## 2020-02-05 ENCOUNTER — Encounter: Payer: Self-pay | Admitting: *Deleted

## 2020-02-05 MED ORDER — SODIUM CHLORIDE 0.9% FLUSH: 3.0000 mL | Freq: Two times a day (BID) | INTRAVENOUS | Status: DC

## 2020-02-05 MED ORDER — PREDNISONE 50 MG PO TABS: ORAL_TABLET | ORAL | 0 refills | Status: DC

## 2020-02-05 MED ORDER — DIPHENHYDRAMINE HCL 50 MG PO TABS: 50.0000 mg | ORAL_TABLET | ORAL | 0 refills | Status: DC

## 2020-02-05 NOTE — Addendum Note (Signed)
Addended by: Levonne Hubert on: 02/05/2020 04:39 PM   Modules accepted: Orders

## 2020-02-05 NOTE — Telephone Encounter (Signed)
Cath scheduled and pt made aware. All orders placed.

## 2020-02-05 NOTE — Telephone Encounter (Signed)
Request call back from [facility] to set up heart cath

## 2020-02-05 NOTE — Telephone Encounter (Signed)
Returned call to pt to set up cath. No answer. Left msg to call back.

## 2020-02-06 ENCOUNTER — Other Ambulatory Visit: Payer: Self-pay

## 2020-02-06 ENCOUNTER — Other Ambulatory Visit (HOSPITAL_COMMUNITY)
Admission: RE | Admit: 2020-02-06 | Discharge: 2020-02-06 | Disposition: A | Discharge status: Home / Self Care | Payer: Medicare HMO | Source: Ambulatory Visit | Attending: Cardiovascular Disease | Admitting: Cardiovascular Disease

## 2020-02-06 ENCOUNTER — Telehealth: Payer: Self-pay | Admitting: *Deleted

## 2020-02-06 DIAGNOSIS — I272 Pulmonary hypertension, unspecified: Secondary | ICD-10-CM | POA: Insufficient documentation

## 2020-02-06 DIAGNOSIS — Z951 Presence of aortocoronary bypass graft: Secondary | ICD-10-CM | POA: Diagnosis not present

## 2020-02-06 DIAGNOSIS — Z01818 Encounter for other preprocedural examination: Secondary | ICD-10-CM | POA: Insufficient documentation

## 2020-02-06 LAB — BASIC METABOLIC PANEL
Anion gap: 9 (ref 5–15)
BUN: 22 mg/dL (ref 8–23)
CO2: 30 mmol/L (ref 22–32)
Calcium: 9.1 mg/dL (ref 8.9–10.3)
Chloride: 95 mmol/L — ABNORMAL LOW (ref 98–111)
Creatinine, Ser: 1.01 mg/dL (ref 0.61–1.24)
GFR, Estimated: >60 mL/min (ref >60)
Glucose, Bld: 94 mg/dL (ref 70–99)
Potassium: 4 mmol/L (ref 3.5–5.1)
Sodium: 134 mmol/L — ABNORMAL LOW (ref 135–145)

## 2020-02-06 LAB — CBC
HCT: 40.2 % (ref 39.0–52.0)
Hemoglobin: 13.5 g/dL (ref 13.0–17.0)
MCH: 32.8 pg (ref 26.0–34.0)
MCHC: 33.6 g/dL (ref 30.0–36.0)
MCV: 97.8 fL (ref 80.0–100.0)
Platelets: 279 10*3/uL (ref 150–400)
RBC: 4.11 MIL/uL — ABNORMAL LOW (ref 4.22–5.81)
RDW: 15.4 % (ref 11.5–15.5)
WBC: 6.9 10*3/uL (ref 4.0–10.5)
nRBC: 0 % (ref 0.0–0.2)

## 2020-02-06 NOTE — Telephone Encounter (Addendum)
Pt contacted pre-catheterization scheduled at [facility] for: Monday February 10, 2020 9 AM Verified arrival time and place: [facility]) at: 7 AM   No solid food after midnight prior to cath, clear liquids until 5 AM day of procedure.  CONTRAST ALLERGY: yes -reviewed 13 hour Prednisone and Benadryl Prep: 02/09/20 Prednisone 50 mg 8 PM-Sunday 02/10/20 Prednisone 50 mg 2 AM-Monday 02/10/20 Prednisone 50 mg and Benadryl 50 mg just prior to leaving for hospital AM of procedure-Monday  Hold: Coumadin-none 02/05/20 until post procedure Lasix-AM of procedure   Except hold medications AM meds can be  taken pre-cath with sips of water including: ASA 81 mg Prednisone 50 mg Benadryl 50 mg Do not drive.  Confirmed patient has responsible adult to drive home post procedure and be with patient first 24 hours after arriving home: yes  You are allowed ONE visitor in the waiting room during the time you are at the hospital for your procedure. Both you and your visitor must wear a mask once you enter the hospital.   Reviewed procedure/mask/visitor instructions with patient.

## 2020-02-06 NOTE — Telephone Encounter (Deleted)
No answer, voicemail message. 

## 2020-02-07 ENCOUNTER — Other Ambulatory Visit (HOSPITAL_COMMUNITY)
Admission: RE | Admit: 2020-02-07 | Discharge: 2020-02-07 | Disposition: A | Discharge status: Home / Self Care | Payer: Medicare HMO | Source: Ambulatory Visit | Attending: Cardiology | Admitting: Cardiology

## 2020-02-07 ENCOUNTER — Encounter: Payer: Medicare HMO | Admitting: Family Medicine

## 2020-02-07 DIAGNOSIS — Z20822 Contact with and (suspected) exposure to covid-19: Secondary | ICD-10-CM | POA: Insufficient documentation

## 2020-02-07 DIAGNOSIS — Z01812 Encounter for preprocedural laboratory examination: Secondary | ICD-10-CM | POA: Insufficient documentation

## 2020-02-07 LAB — SARS CORONAVIRUS 2 (TAT 6-24 HRS): SARS Coronavirus 2: NEGATIVE

## 2020-02-10 ENCOUNTER — Telehealth: Payer: Self-pay | Admitting: *Deleted

## 2020-02-10 NOTE — Telephone Encounter (Signed)
Rescheduled from 02/10/20 due to weather. Pt contacted pre-catheterization scheduled at [facility] for:  Tuesday February 11, 2020 9 AM Verified arrival time and place: [facility]) at: 7 AM  No solid food after midnight prior to cath, clear liquids until 5 AM day of procedure.   CONTRAST ALLERGY: yes-13 hour Prednisone and Benadryl Prep reviewed with patient: 02/10/20 Prednisone 50 mg 8 PM Monday 02/11/20 Prednisone 50 mg 2 AM Tuesday 02/11/20 Prednisone 50 mg and Benadryl 50 mg just prior to leaving for hospital AM of procedure Tuesday Pt advised not to drive.    Hold: Warfarin-pt reports he has not taken warfarin since 02/05/20 Lasix-AM of procedure  Except hold medications AM meds can be  taken pre-cath with sips of water including: ASA 81 mg   Confirmed patient has responsible adult to drive home post procedure and be with patient first 24 hours after arriving home: yes  You are allowed ONE visitor in the waiting room during the time you are at the hospital for your procedure. Both you and your visitor must wear a mask once you enter the hospital.              Pt reports he has quarantined since pre-procedure COVID test 02/07/20 and knows to continue to quarantine until procedure 02/11/20. Reviewed procedure/mask/visitor instructions with patient.   Pt reports his warfarin is managed by [facility]. Pt advised I will forward information to [facility] to let them know warfarin has been on hold since 02/05/20 for [facility] 02/11/20 and to arrange PT/INR follow-up after [facility] 02/11/20.

## 2020-02-10 NOTE — Telephone Encounter (Signed)
Appointment made. Patient aware. 

## 2020-02-10 NOTE — Telephone Encounter (Signed)
Patient will need INR check to be scheduled - please schedule.

## 2020-02-10 NOTE — Telephone Encounter (Signed)
Forwarding to PCP.

## 2020-02-11 ENCOUNTER — Other Ambulatory Visit: Payer: Self-pay

## 2020-02-11 ENCOUNTER — Ambulatory Visit (HOSPITAL_COMMUNITY)
Admission: RE | Disposition: A | Discharge status: Home / Self Care | Payer: Self-pay | Source: Ambulatory Visit | Attending: Cardiology

## 2020-02-11 ENCOUNTER — Encounter (HOSPITAL_COMMUNITY): Payer: Self-pay | Admitting: Cardiology

## 2020-02-11 ENCOUNTER — Ambulatory Visit (HOSPITAL_COMMUNITY)
Admission: RE | Admit: 2020-02-11 | Discharge: 2020-02-11 | Disposition: A | Discharge status: Home / Self Care | Payer: Medicare HMO | Source: Ambulatory Visit | Attending: Cardiology | Admitting: Cardiology

## 2020-02-11 DIAGNOSIS — I2510 Atherosclerotic heart disease of native coronary artery without angina pectoris: Secondary | ICD-10-CM | POA: Diagnosis not present

## 2020-02-11 DIAGNOSIS — I2582 Chronic total occlusion of coronary artery: Secondary | ICD-10-CM | POA: Diagnosis not present

## 2020-02-11 DIAGNOSIS — I6521 Occlusion and stenosis of right carotid artery: Secondary | ICD-10-CM | POA: Insufficient documentation

## 2020-02-11 DIAGNOSIS — R0600 Dyspnea, unspecified: Secondary | ICD-10-CM | POA: Diagnosis not present

## 2020-02-11 DIAGNOSIS — I272 Pulmonary hypertension, unspecified: Secondary | ICD-10-CM | POA: Diagnosis present

## 2020-02-11 DIAGNOSIS — I4819 Other persistent atrial fibrillation: Secondary | ICD-10-CM | POA: Insufficient documentation

## 2020-02-11 DIAGNOSIS — Z87891 Personal history of nicotine dependence: Secondary | ICD-10-CM | POA: Insufficient documentation

## 2020-02-11 DIAGNOSIS — Z881 Allergy status to other antibiotic agents status: Secondary | ICD-10-CM | POA: Insufficient documentation

## 2020-02-11 DIAGNOSIS — I10 Essential (primary) hypertension: Secondary | ICD-10-CM | POA: Diagnosis not present

## 2020-02-11 DIAGNOSIS — Z79899 Other long term (current) drug therapy: Secondary | ICD-10-CM | POA: Insufficient documentation

## 2020-02-11 DIAGNOSIS — Z951 Presence of aortocoronary bypass graft: Secondary | ICD-10-CM | POA: Insufficient documentation

## 2020-02-11 DIAGNOSIS — R0609 Other forms of dyspnea: Secondary | ICD-10-CM | POA: Diagnosis present

## 2020-02-11 DIAGNOSIS — I480 Paroxysmal atrial fibrillation: Secondary | ICD-10-CM | POA: Diagnosis present

## 2020-02-11 DIAGNOSIS — Z7901 Long term (current) use of anticoagulants: Secondary | ICD-10-CM | POA: Diagnosis not present

## 2020-02-11 DIAGNOSIS — Z885 Allergy status to narcotic agent status: Secondary | ICD-10-CM | POA: Diagnosis not present

## 2020-02-11 DIAGNOSIS — E7800 Pure hypercholesterolemia, unspecified: Secondary | ICD-10-CM | POA: Diagnosis present

## 2020-02-11 HISTORY — PX: RIGHT/LEFT HEART CATH AND CORONARY/GRAFT ANGIOGRAPHY: CATH118267

## 2020-02-11 LAB — POCT I-STAT 7, (LYTES, BLD GAS, ICA,H+H)
Acid-Base Excess: 1 mmol/L (ref 0.0–2.0)
Bicarbonate: 26.1 mmol/L (ref 20.0–28.0)
Calcium, Ion: 1.21 mmol/L (ref 1.15–1.40)
HCT: 41 % (ref 39.0–52.0)
Hemoglobin: 13.9 g/dL (ref 13.0–17.0)
O2 Saturation: 100 %
Potassium: 3.9 mmol/L (ref 3.5–5.1)
Sodium: 134 mmol/L — ABNORMAL LOW (ref 135–145)
TCO2: 27 mmol/L (ref 22–32)
pCO2 arterial: 42.9 mmHg (ref 32.0–48.0)
pH, Arterial: 7.393 (ref 7.350–7.450)
pO2, Arterial: 174 mmHg — ABNORMAL HIGH (ref 83.0–108.0)

## 2020-02-11 LAB — POCT I-STAT EG7
Acid-Base Excess: 2 mmol/L (ref 0.0–2.0)
Bicarbonate: 27.1 mmol/L (ref 20.0–28.0)
Calcium, Ion: 1.24 mmol/L (ref 1.15–1.40)
HCT: 42 % (ref 39.0–52.0)
Hemoglobin: 14.3 g/dL (ref 13.0–17.0)
O2 Saturation: 74 %
Potassium: 3.9 mmol/L (ref 3.5–5.1)
Sodium: 134 mmol/L — ABNORMAL LOW (ref 135–145)
TCO2: 28 mmol/L (ref 22–32)
pCO2, Ven: 43.4 mmHg — ABNORMAL LOW (ref 44.0–60.0)
pH, Ven: 7.403 (ref 7.250–7.430)
pO2, Ven: 39 mmHg (ref 32.0–45.0)

## 2020-02-11 LAB — PROTIME-INR
INR: 1.2 (ref 0.8–1.2)
Prothrombin Time: 14.6 seconds (ref 11.4–15.2)

## 2020-02-11 SURGERY — RIGHT/LEFT HEART CATH AND CORONARY/GRAFT ANGIOGRAPHY
Anesthesia: LOCAL

## 2020-02-11 MED ORDER — SODIUM CHLORIDE 0.9 % WEIGHT BASED INFUSION: 1.0000 mL/kg/h | INTRAVENOUS | Status: AC

## 2020-02-11 MED ORDER — SODIUM CHLORIDE 0.9 % IV SOLN: 250.0000 mL | INTRAVENOUS | Status: DC | PRN

## 2020-02-11 MED ORDER — IOHEXOL 350 MG/ML SOLN
INTRAVENOUS | Status: DC | PRN
  Administered 2020-02-11: 105 mL

## 2020-02-11 MED ORDER — ONDANSETRON HCL 4 MG/2ML IJ SOLN: 4.0000 mg | Freq: Four times a day (QID) | INTRAMUSCULAR | Status: DC | PRN

## 2020-02-11 MED ORDER — LIDOCAINE HCL (PF) 1 % IJ SOLN
INTRAMUSCULAR | Status: AC
  Filled 2020-02-11: qty 30

## 2020-02-11 MED ORDER — FENTANYL CITRATE (PF) 100 MCG/2ML IJ SOLN
INTRAMUSCULAR | Status: DC | PRN
  Administered 2020-02-11: 25 ug via INTRAVENOUS

## 2020-02-11 MED ORDER — SODIUM CHLORIDE 0.9 % WEIGHT BASED INFUSION
3.0000 mL/kg/h | INTRAVENOUS | Status: AC
  Administered 2020-02-11: 3 mL/kg/h via INTRAVENOUS

## 2020-02-11 MED ORDER — ACETAMINOPHEN 325 MG PO TABS: 650.0000 mg | ORAL_TABLET | ORAL | Status: DC | PRN

## 2020-02-11 MED ORDER — SODIUM CHLORIDE 0.9% FLUSH: 3.0000 mL | Freq: Two times a day (BID) | INTRAVENOUS | Status: DC

## 2020-02-11 MED ORDER — HEPARIN (PORCINE) IN NACL 1000-0.9 UT/500ML-% IV SOLN
INTRAVENOUS | Status: AC
  Filled 2020-02-11: qty 1000

## 2020-02-11 MED ORDER — MIDAZOLAM HCL 2 MG/2ML IJ SOLN
INTRAMUSCULAR | Status: AC
  Filled 2020-02-11: qty 2

## 2020-02-11 MED ORDER — HYDRALAZINE HCL 20 MG/ML IJ SOLN
10.0000 mg | Freq: Once | INTRAMUSCULAR | Status: AC
  Administered 2020-02-11: 10 mg via INTRAVENOUS

## 2020-02-11 MED ORDER — LIDOCAINE HCL (PF) 1 % IJ SOLN
INTRAMUSCULAR | Status: DC | PRN
  Administered 2020-02-11: 20 mL

## 2020-02-11 MED ORDER — HYDRALAZINE HCL 20 MG/ML IJ SOLN
INTRAMUSCULAR | Status: AC
  Filled 2020-02-11: qty 1

## 2020-02-11 MED ORDER — IOHEXOL 350 MG/ML SOLN
INTRAVENOUS | Status: AC
  Filled 2020-02-11: qty 1

## 2020-02-11 MED ORDER — MIDAZOLAM HCL 2 MG/2ML IJ SOLN
INTRAMUSCULAR | Status: DC | PRN
  Administered 2020-02-11: 1 mg via INTRAVENOUS

## 2020-02-11 MED ORDER — FENTANYL CITRATE (PF) 100 MCG/2ML IJ SOLN
INTRAMUSCULAR | Status: AC
  Filled 2020-02-11: qty 2

## 2020-02-11 MED ORDER — SODIUM CHLORIDE 0.9% FLUSH: 3.0000 mL | INTRAVENOUS | Status: DC | PRN

## 2020-02-11 MED ORDER — SODIUM CHLORIDE 0.9 % WEIGHT BASED INFUSION: 1.0000 mL/kg/h | INTRAVENOUS | Status: DC

## 2020-02-11 MED ORDER — ASPIRIN 81 MG PO CHEW: 81.0000 mg | CHEWABLE_TABLET | ORAL | Status: DC

## 2020-02-11 SURGICAL SUPPLY — 16 items
BAG SNAP BAND KOVER 36X36 (MISCELLANEOUS) ×2 IMPLANT
CATH INFINITI 5 FR IM (CATHETERS) ×2 IMPLANT
CATH INFINITI 5FR MULTPACK ANG (CATHETERS) ×2 IMPLANT
CATH SWAN GANZ 7F STRAIGHT (CATHETERS) ×2 IMPLANT
COVER DOME SNAP 22 D (MISCELLANEOUS) ×2 IMPLANT
KIT HEART LEFT (KITS) ×2 IMPLANT
PACK CARDIAC CATHETERIZATION (CUSTOM PROCEDURE TRAY) ×2 IMPLANT
SHEATH PINNACLE 5F 10CM (SHEATH) ×2 IMPLANT
SHEATH PINNACLE 7F 10CM (SHEATH) ×2 IMPLANT
SHEATH PROBE COVER 6X72 (BAG) ×2 IMPLANT
SYR CONTROL 10ML ANGIOGRAPHIC (SYRINGE) ×2 IMPLANT
TRANSDUCER W/STOPCOCK (MISCELLANEOUS) ×2 IMPLANT
TUBING CIL FLEX 10 FLL-RA (TUBING) ×2 IMPLANT
WIRE EMERALD 3MM-J .025X260CM (WIRE) IMPLANT
WIRE EMERALD 3MM-J .035X150CM (WIRE) ×2 IMPLANT
WIRE EMERALD 3MM-J .035X260CM (WIRE) ×2 IMPLANT

## 2020-02-11 NOTE — Progress Notes (Signed)
Patient had a systolic BP in the 939-030S.  Spoke with Dr. Martinique who ordered 10mg  of hydralazine and an in and out cath.  Hydralazine was administered and 118ml of urine was obtained with the in an out catheterization.  Systolic BP is now in the 150s.  Will proceed with pulling arterial and venous sheaths.

## 2020-02-11 NOTE — Progress Notes (Signed)
Site: right femoral Sheath Size: 5 fr arterial and 92fr venous Condition prior to removal:  Level 0 Type of pressure held: manual / arterial - 20 minutes and venous 10 minutes Time pressure held: above Status of patient during pull: stable Condition of site post pull:  Level 0 Type of dressing applied: gauze w/ transparent Pulses verified: right dorsalis 1+  Patient's condition post pull: stable Bedrest begins at: 1215 Post instructions given to patient: yes

## 2020-02-11 NOTE — Discharge Instructions (Signed)
Femoral Site Care This sheet gives you information about how to care for yourself after your procedure. Your health care provider may also give you more specific instructions. If you have problems or questions, contact your health care provider. What can I expect after the procedure?  After the procedure, it is common to have:  Bruising that usually fades within 1-2 weeks.  Tenderness at the site. Follow these instructions at home: Wound care 1. Follow instructions from your health care provider about how to take care of your insertion site. Make sure you: ? Wash your hands with soap and water before you change your bandage (dressing). If soap and water are not available, use hand sanitizer. ? Remove your dressing as told by your health care provider. In 24 hours 2. Do not take baths, swim, or use a hot tub until your health care provider approves. 3. You may shower 24-48 hours after the procedure or as told by your health care provider. ? Gently wash the site with plain soap and water. ? Pat the area dry with a clean towel. ? Do not rub the site. This may cause bleeding. 4. Do not apply powder or lotion to the site. Keep the site clean and dry. 5. Check your femoral site every day for signs of infection. Check for: ? Redness, swelling, or pain. ? Fluid or blood. ? Warmth. ? Pus or a bad smell. Activity 1. For the first 2-3 days after your procedure, or as long as directed: ? Avoid climbing stairs as much as possible. ? Do not squat. 2. Do not lift anything that is heavier than 10 lb (4.5 kg), or the limit that you are told, until your health care provider says that it is safe. For 5 days 3. Rest as directed. ? Avoid sitting for a long time without moving. Get up to take short walks every 1-2 hours. 4. Do not drive for 24 hours if you were given a medicine to help you relax (sedative). General instructions  Take over-the-counter and prescription medicines only as told by your  health care provider.  Keep all follow-up visits as told by your health care provider. This is important. Contact a health care provider if you have:  A fever or chills.  You have redness, swelling, or pain around your insertion site. Get help right away if:  The catheter insertion area swells very fast.  You pass out.  You suddenly start to sweat or your skin gets clammy.  The catheter insertion area is bleeding, and the bleeding does not stop when you hold steady pressure on the area.  The area near or just beyond the catheter insertion site becomes pale, cool, tingly, or numb. These symptoms may represent a serious problem that is an emergency. Do not wait to see if the symptoms will go away. Get medical help right away. Call your local emergency services (911 in the [place] Do not drive yourself to the hospital. Summary  After the procedure, it is common to have bruising that usually fades within 1-2 weeks.  Check your femoral site every day for signs of infection.  Do not lift anything that is heavier than 10 lb (4.5 kg), or the limit that you are told, until your health care provider says that it is safe. This information is not intended to replace advice given to you by your health care provider. Make sure you discuss any questions you have with your health care provider. Document Revised: 01/23/2017 Document Reviewed: 01/23/2017   Elsevier Patient Education  2020 Elsevier Inc.   

## 2020-02-11 NOTE — Interval H&P Note (Signed)
History and Physical Interval Note:  02/11/2020 10:07 AM  Danny [name].  has presented today for surgery, with the diagnosis of DOE.  The various methods of treatment have been discussed with the patient and family. After consideration of risks, benefits and other options for treatment, the patient has consented to  Procedure(s): RIGHT/LEFT HEART CATH AND CORONARY/GRAFT ANGIOGRAPHY (N/A) as a surgical intervention.  The patient's history has been reviewed, patient examined, no change in status, stable for surgery.  I have reviewed the patient's chart and labs.  Questions were answered to the patient's satisfaction.   Cath Lab Visit (complete for each Cath Lab visit)  Clinical Evaluation Leading to the Procedure:   ACS: No.  Non-ACS:    Anginal Classification: CCS II  Anti-ischemic medical therapy: Minimal Therapy (1 class of medications)  Non-Invasive Test Results: No non-invasive testing performed  Prior CABG: Previous CABG        Danny [name] [facility] 02/11/2020 10:07 AM

## 2020-02-12 MED FILL — Heparin Sod (Porcine)-NaCl IV Soln 1000 Unit/500ML-0.9%: INTRAVENOUS | Qty: 1000 | Status: AC

## 2020-02-14 ENCOUNTER — Encounter: Payer: Self-pay | Admitting: Family Medicine

## 2020-02-14 ENCOUNTER — Ambulatory Visit (INDEPENDENT_AMBULATORY_CARE_PROVIDER_SITE_OTHER): Payer: Medicare HMO | Admitting: Family Medicine

## 2020-02-14 ENCOUNTER — Other Ambulatory Visit: Payer: Self-pay

## 2020-02-14 VITALS — BP 129/74 | HR 62 | Temp 97.2°F | Ht 74.0 in | Wt 171.2 lb

## 2020-02-14 DIAGNOSIS — I480 Paroxysmal atrial fibrillation: Secondary | ICD-10-CM | POA: Diagnosis not present

## 2020-02-14 DIAGNOSIS — R600 Localized edema: Secondary | ICD-10-CM

## 2020-02-14 DIAGNOSIS — Z7901 Long term (current) use of anticoagulants: Secondary | ICD-10-CM | POA: Diagnosis not present

## 2020-02-14 LAB — COAGUCHEK XS/INR WAIVED
INR: 1.4 — ABNORMAL HIGH (ref 0.9–1.1)
Prothrombin Time: 16.6 s

## 2020-02-14 MED ORDER — FUROSEMIDE 20 MG PO TABS
20.0000 mg | ORAL_TABLET | Freq: Every day | ORAL | 2 refills | Status: DC
Start: 2020-02-14 — End: 2020-04-30

## 2020-02-14 NOTE — Patient Instructions (Signed)
Start wearing compression hose daily.   Edema  Edema is when you have too much fluid in your body or under your skin. Edema may make your legs, feet, and ankles swell up. Swelling is also common in looser tissues, like around your eyes. This is a common condition. It gets more common as you get older. There are many possible causes of edema. Eating too much salt (sodium) and being on your feet or sitting for a long time can cause edema in your legs, feet, and ankles. Hot weather may make edema worse. Edema is usually painless. Your skin may look swollen or shiny. Follow these instructions at home:  Keep the swollen body part raised (elevated) above the level of your heart when you are sitting or lying down.  Do not sit still or stand for a long time.  Do not wear tight clothes. Do not wear garters on your upper legs.  Exercise your legs. This can help the swelling go down.  Wear elastic bandages or support stockings as told by your doctor.  Eat a low-salt (low-sodium) diet to reduce fluid as told by your doctor.  Depending on the cause of your swelling, you may need to limit how much fluid you drink (fluid restriction).  Take over-the-counter and prescription medicines only as told by your doctor. Contact a doctor if:  Treatment is not working.  You have heart, liver, or kidney disease and have symptoms of edema.  You have sudden and unexplained weight gain. Get help right away if:  You have shortness of breath or chest pain.  You cannot breathe when you lie down.  You have pain, redness, or warmth in the swollen areas.  You have heart, liver, or kidney disease and get edema all of a sudden.  You have a fever and your symptoms get worse all of a sudden. Summary  Edema is when you have too much fluid in your body or under your skin.  Edema may make your legs, feet, and ankles swell up. Swelling is also common in looser tissues, like around your eyes.  Raise (elevate)  the swollen body part above the level of your heart when you are sitting or lying down.  Follow your doctor's instructions about diet and how much fluid you can drink (fluid restriction). This information is not intended to replace advice given to you by your health care provider. Make sure you discuss any questions you have with your health care provider. Document Revised: 11/06/2019 Document Reviewed: 11/06/2019 Elsevier Patient Education  2021 [employer].

## 2020-02-14 NOTE — Progress Notes (Signed)
Assessment & Plan:  1-2. Long term (current) use of anticoagulants/Paroxysmal atrial fibrillation ([place]) Description   INR today 1.4 (goal is 2-3) but he has just resumed 3 days ago after holding prior to procedure.   Continue taking 2.5 mg (half tablet) every Thursday and 5 mg (1 tablet) all other days.   Re-check 1 week      - CoaguChek XS/INR Waived  3. Edema of both lower extremities - Education provided on edema.  Encouraged patient to wear compression hose and discussed applying them first thing in the morning and removing them in the evening.  I did refill his furosemide since he does not think he has any at home. - furosemide (LASIX) 20 MG tablet; Take 1 tablet (20 mg total) by mouth daily.  Dispense: 30 tablet; Refill: 2   Return in about 1 week (around 02/21/2020) for INR.  [name], MSN, APRN, FNP-C [facility]  Subjective:    Patient ID: Danny Iaccarino., male    DOB: 1932/01/15, 88 y.o.   MRN: [id number]  Patient Care Team: Loman Brooklyn, FNP as PCP - General (Family Medicine) Josue Hector, MD as PCP - Cardiology (Cardiology) Josue Hector, MD as Attending Physician (Cardiology) [name], MD (Inactive) as Consulting Physician (Gastroenterology) Okey Regal, OD (Optometry) Rexene Alberts, MD as Consulting Physician (Cardiothoracic Surgery) Ilean China, RN as Registered Nurse Allyn Kenner, MD as Consulting Physician (Dermatology)   Chief Complaint:  Chief Complaint  Patient presents with  . Coagulation Disorder    HPI: Danny Heman. is a 87 y.o. male presenting on 02/14/2020 for Coagulation Disorder  Anticoagulation: Patient here for anticoagulation monitoring. Indication: atrial fibrillation Bleeding Signs/Symptoms:  None Thromboembolic Signs/Symptoms:  None  Missed Coumadin Doses: Coumadin was on hold 6 days prior to heart catheterization.  He resumed his normal Coumadin dosage 3 days  ago. Medication Changes: Recently required prednisone and Benadryl prior to procedure due to a contrast allergy. Dietary Changes:  no Bacterial/Viral Infection:  no  Other Concerns:  yes (see below)  New complaints: Patient reports his legs often feel tight in the evenings.  He says they are not like this in the morning when he first gets up.  He does endorse some mild shortness of breath with exertion.  He does not wear compression hose.  He does have a prescription for Lasix but states he does not feel he has any of this medication at home.   Social history:  Relevant past medical, surgical, family and social history reviewed and updated as indicated. Interim medical history since our last visit reviewed.  Allergies and medications reviewed and updated.  DATA REVIEWED: CHART IN EPIC  ROS: Negative unless specifically indicated above in HPI.    Current Outpatient Medications:  .  amiodarone (PACERONE) 200 MG tablet, Take 0.5 tablets (100 mg total) by mouth daily., Disp: 45 tablet, Rfl: 3 .  cholecalciferol (VITAMIN D) 25 MCG (1000 UNIT) tablet, Take 2,000 Units by mouth daily with breakfast., Disp: , Rfl:  .  diphenhydrAMINE (BENADRYL) 50 MG tablet, Take 1 tablet (50 mg total) by mouth as directed. Take 1 Tablet just before leaving home on 02/10/20 for cath, Disp: 1 tablet, Rfl: 0 .  furosemide (LASIX) 20 MG tablet, Take 1 tablet (20 mg total) by mouth daily. (Patient taking differently: Take 20 mg by mouth at bedtime.), Disp: 60 tablet, Rfl: 3 .  losartan (COZAAR) 50 MG tablet, TAKE 1 TABLET EVERY  MORNING (Patient taking differently: Take 50 mg by mouth daily.), Disp: 90 tablet, Rfl: 0 .  Multiple Vitamin (MULTIVITAMIN WITH MINERALS) TABS tablet, Take 1 tablet by mouth daily. One-A-Day for Men 50+, Disp: , Rfl:  .  Multiple Vitamins-Minerals (PRESERVISION AREDS 2 PO), Take 1 tablet by mouth in the morning and at bedtime., Disp: , Rfl:  .  nitroGLYCERIN (NITROSTAT) 0.4 MG SL tablet,  PLACE 1 TABLET (0.4 MG TOTAL) UNDER THE TONGUE EVERY 5 (FIVE) MINUTES AS NEEDED FOR CHEST PAIN. (Patient taking differently: Place 0.4 mg under the tongue every 5 (five) minutes x 3 doses as needed.), Disp: 100 tablet, Rfl: 3 .  Omega-3 Fatty Acids (FISH OIL) 1200 MG CAPS, Take 2,400 mg by mouth daily., Disp: , Rfl:  .  omeprazole (PRILOSEC) 20 MG capsule, TAKE 1 CAPSULE TWICE DAILY (Patient taking differently: Take 20 mg by mouth in the morning and at bedtime. 30 minutes before breakfast & supper), Disp: 180 capsule, Rfl: 3 .  Probiotic Product (PHILLIPS COLON HEALTH PO), Take 1 capsule by mouth daily., Disp: , Rfl:  .  warfarin (COUMADIN) 5 MG tablet, TAKE 1 TABLET EVERY DAY (Patient taking differently: Take 2.5-5 mg by mouth See admin instructions. Take 0.5 tablet (2.5 mg) by mouth at bedtime on Thursdays, take 1 tablet (5 mg) by mouth at bedtime on all other nights), Disp: 90 tablet, Rfl: 3 .  metoprolol tartrate (LOPRESSOR) 50 MG tablet, Take 1 tablet (50 mg total) by mouth 2 (two) times daily., Disp: 180 tablet, Rfl: 3   Allergies  Allergen Reactions  . Doxycycline Diarrhea  . Contrast Media [Iodinated Diagnostic Agents]     Syncope  . Lunesta [Eszopiclone]     "felt WILD"  . Morphine And Related Nausea And Vomiting  . Trazodone And Nefazodone Other (See Comments)    Extremely drowsy the next day after taking.   Past Medical History:  Diagnosis Date  . Anxiety   . Atrial fibrillation ([place])   . CAD, ARTERY BYPASS GRAFT 05/19/2008   Qualifier: Diagnosis of  By: Mare Ferrari, [place]illa [place], Sherri    . Enteritis due to Clostridium difficile, history of 11/21/2012  . GERD (gastroesophageal reflux disease)   . Hyperlipidemia   . Hypertension   . Internal carotid artery stenosis   . 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 Vitamin D deficiency     Past Surgical History:  Procedure Laterality Date  . CARDIOVERSION N/A 05/05/2016   Procedure: CARDIOVERSION;  Surgeon: Josue Hector, MD;  Location: [facility];  Service: Cardiovascular;  Laterality: N/A;  . CAROTID ENDARTERECTOMY Left 2003  . CAROTID-SUBCLAVIAN BYPASS GRAFT Right   . COLONOSCOPY  2005   hyperplastic polyp x 1 with no adenoma  . COLONOSCOPY N/A 04/11/2014   [id number]:WRUE diverticulosis in the sigmoid colon/left colon is redundant  . CORONARY ARTERY BYPASS GRAFT    . ESOPHAGEAL DILATION N/A 04/11/2014   Procedure: ESOPHAGEAL DILATION;  Surgeon: [name], MD;  [facility];  Service: Endoscopy;  Laterality: N/A;  . ESOPHAGOGASTRODUODENOSCOPY N/A 04/11/2014   [id number] at the gastro junction/moderate erosive/duodenal web  . EYE SURGERY Bilateral    cataracts  . RIGHT/LEFT HEART CATH AND CORONARY/GRAFT ANGIOGRAPHY N/A 02/11/2020   Procedure: RIGHT/LEFT HEART CATH AND CORONARY/GRAFT ANGIOGRAPHY;  Surgeon: Martinique, Peter M, MD;  Location: [facility];  Service: Cardiovascular;  Laterality: N/A;  . SKIN CANCER EXCISION      Social History   Socioeconomic History  .  Marital status: Married    Spouse name: ruby  . Number of children: 1  . Years of education: Not on file  . Highest education level: Not on file  Occupational History  . Occupation: retired    Fish farm manager: [employer]  Tobacco Use  . Smoking status: Former Smoker    Types: Cigarettes    Start date: 01/24/1950    Quit date: 01/24/1961    Years since quitting: 59.0  . Smokeless tobacco: Never Used  Vaping Use  . Vaping Use: Never used  Substance and Sexual Activity  . Alcohol use: Not Currently    Alcohol/week: 0.0 standard drinks    Comment: not drank since 12/2009  . Drug use: No  . Sexual activity: Yes    Partners: Female  Other Topics Concern  . Not on file  Social History Narrative  . Not on file   Social Determinants of Health   Financial Resource Strain: Not on file  Food Insecurity: Not on file  Transportation Needs: Not on file  Physical Activity: Not on file  Stress: Not on file  Social  Connections: Not on file  Intimate Partner Violence: Not on file        Objective:    BP 129/74   Pulse 62   Temp (!) 97.2 F (36.2 C) (Temporal)   Ht 6\' 2"  (1.88 m)   Wt 171 lb 3.2 oz (77.7 kg)   BMI 21.98 kg/m   Wt Readings from Last 3 Encounters:  02/14/20 171 lb 3.2 oz (77.7 kg)  02/11/20 167 lb (75.8 kg)  01/13/20 178 lb (80.7 kg)    Physical Exam Vitals reviewed.  Constitutional:      General: He is not in acute distress.    Appearance: Normal appearance. He is normal weight. He is not ill-appearing, toxic-appearing or diaphoretic.  HENT:     Head: Normocephalic and atraumatic.  Eyes:     General: No scleral icterus.       Right eye: No discharge.        Left eye: No discharge.     Conjunctiva/sclera: Conjunctivae normal.  Cardiovascular:     Rate and Rhythm: Normal rate.  Pulmonary:     Effort: Pulmonary effort is normal. No respiratory distress.  Musculoskeletal:        General: Normal range of motion.     Cervical back: Normal range of motion.     Right lower leg: Edema present.     Left lower leg: Edema present.  Skin:    General: Skin is warm and dry.  Neurological:     Mental Status: He is alert and oriented to person, place, and time. Mental status is at baseline.  Psychiatric:        Mood and Affect: Mood normal.        Behavior: Behavior normal.        Thought Content: Thought content normal.        Judgment: Judgment normal.     Lab Results  Component Value Date   TSH 0.969 10/14/2019   Lab Results  Component Value Date   WBC 6.9 02/06/2020   HGB 14.3 02/11/2020   HCT 42.0 02/11/2020   MCV 97.8 02/06/2020   PLT 279 02/06/2020   Lab Results  Component Value Date   NA 134 (L) 02/11/2020   K 3.9 02/11/2020   CO2 30 02/06/2020   GLUCOSE 94 02/06/2020   BUN 22 02/06/2020   CREATININE 1.01 02/06/2020   BILITOT 0.9  10/14/2019   ALKPHOS 110 10/14/2019   AST 30 10/14/2019   ALT 32 10/14/2019   PROT 7.0 10/14/2019   ALBUMIN 4.0  10/14/2019   CALCIUM 9.1 02/06/2020   ANIONGAP 9 02/06/2020   Lab Results  Component Value Date   CHOL 170 02/20/2019   Lab Results  Component Value Date   HDL 87 02/20/2019   Lab Results  Component Value Date   LDLCALC 71 02/20/2019   Lab Results  Component Value Date   TRIG 60 02/20/2019   Lab Results  Component Value Date   CHOLHDL 2.0 02/20/2019   No results found for: HGBA1C

## 2020-02-17 ENCOUNTER — Encounter: Payer: Self-pay | Admitting: Family Medicine

## 2020-02-21 ENCOUNTER — Ambulatory Visit (INDEPENDENT_AMBULATORY_CARE_PROVIDER_SITE_OTHER): Payer: Medicare HMO | Admitting: Family Medicine

## 2020-02-21 ENCOUNTER — Encounter: Payer: Self-pay | Admitting: Family Medicine

## 2020-02-21 ENCOUNTER — Other Ambulatory Visit: Payer: Self-pay

## 2020-02-21 VITALS — BP 158/87 | HR 93 | Temp 97.9°F | Ht 74.0 in | Wt 164.0 lb

## 2020-02-21 DIAGNOSIS — Z7901 Long term (current) use of anticoagulants: Secondary | ICD-10-CM | POA: Diagnosis not present

## 2020-02-21 DIAGNOSIS — I480 Paroxysmal atrial fibrillation: Secondary | ICD-10-CM

## 2020-02-21 LAB — COAGUCHEK XS/INR WAIVED
INR: 1.4 — ABNORMAL HIGH (ref 0.9–1.1)
Prothrombin Time: 16.5 s

## 2020-02-21 MED ORDER — WARFARIN SODIUM 5 MG PO TABS: 5.0000 mg | ORAL_TABLET | Freq: Every day | ORAL | 3 refills | Status: DC

## 2020-02-21 NOTE — Progress Notes (Signed)
Assessment & Plan:  1-2. Long term (current) use of anticoagulants/Paroxysmal atrial fibrillation [facility]) Description   INR today 1.4 (goal is 2-3).  Take extra half a tablet today, then take 5 mg daily.  Re-check 2 week      - CoaguChek XS/INR Waived - warfarin (COUMADIN) 5 MG tablet; Take 1 tablet (5 mg total) by mouth daily.  Dispense: 90 tablet; Refill: 3   Follow up plan: Return in about 2 weeks (around 03/06/2020) for INR.  [name], MSN, APRN, FNP-C [facility]  Subjective:   Patient ID: Danny Outman., male    DOB: 07/11/1931, 87 y.o.   MRN: [id number]  HPI: Danny Delapena. is a 87 y.o. male presenting on 02/21/2020 for Coagulation Disorder  Anticoagulation: Patient here for anticoagulation monitoring. Indication: atrial fibrillation Bleeding Signs/Symptoms:  None Thromboembolic Signs/Symptoms:  None  Missed Coumadin Doses:  None Medication Changes:  no Dietary Changes:  no Bacterial/Viral Infection:  no  Other Concerns:  no   ROS: Negative unless specifically indicated above in HPI.   Relevant past medical history reviewed and updated as indicated.   Allergies and medications reviewed and updated.   Current Outpatient Medications:  .  amiodarone (PACERONE) 200 MG tablet, Take 0.5 tablets (100 mg total) by mouth daily., Disp: 45 tablet, Rfl: 3 .  cholecalciferol (VITAMIN D) 25 MCG (1000 UNIT) tablet, Take 2,000 Units by mouth daily with breakfast., Disp: , Rfl:  .  diphenhydrAMINE (BENADRYL) 50 MG tablet, Take 1 tablet (50 mg total) by mouth as directed. Take 1 Tablet just before leaving home on 02/10/20 for cath, Disp: 1 tablet, Rfl: 0 .  furosemide (LASIX) 20 MG tablet, Take 1 tablet (20 mg total) by mouth daily., Disp: 30 tablet, Rfl: 2 .  losartan (COZAAR) 50 MG tablet, TAKE 1 TABLET EVERY MORNING (Patient taking differently: Take 50 mg by mouth daily.), Disp: 90 tablet, Rfl: 0 .  Multiple Vitamin (MULTIVITAMIN WITH  MINERALS) TABS tablet, Take 1 tablet by mouth daily. One-A-Day for Men 50+, Disp: , Rfl:  .  Multiple Vitamins-Minerals (PRESERVISION AREDS 2 PO), Take 1 tablet by mouth in the morning and at bedtime., Disp: , Rfl:  .  nitroGLYCERIN (NITROSTAT) 0.4 MG SL tablet, PLACE 1 TABLET (0.4 MG TOTAL) UNDER THE TONGUE EVERY 5 (FIVE) MINUTES AS NEEDED FOR CHEST PAIN. (Patient taking differently: Place 0.4 mg under the tongue every 5 (five) minutes x 3 doses as needed.), Disp: 100 tablet, Rfl: 3 .  Omega-3 Fatty Acids (FISH OIL) 1200 MG CAPS, Take 2,400 mg by mouth daily., Disp: , Rfl:  .  omeprazole (PRILOSEC) 20 MG capsule, TAKE 1 CAPSULE TWICE DAILY (Patient taking differently: Take 20 mg by mouth in the morning and at bedtime. 30 minutes before breakfast & supper), Disp: 180 capsule, Rfl: 3 .  Probiotic Product (PHILLIPS COLON HEALTH PO), Take 1 capsule by mouth daily., Disp: , Rfl:  .  metoprolol tartrate (LOPRESSOR) 50 MG tablet, Take 1 tablet (50 mg total) by mouth 2 (two) times daily., Disp: 180 tablet, Rfl: 3 .  warfarin (COUMADIN) 5 MG tablet, Take 1 tablet (5 mg total) by mouth daily., Disp: 90 tablet, Rfl: 3  Allergies  Allergen Reactions  . Doxycycline Diarrhea  . Contrast Media [Iodinated Diagnostic Agents]     Syncope  . Lunesta [Eszopiclone]     "felt WILD"  . Morphine And Related Nausea And Vomiting  . Trazodone And Nefazodone Other (See Comments)    Extremely  drowsy the next day after taking.    Objective:   BP (!) 158/87   Pulse 93   Temp 97.9 F (36.6 C) (Temporal)   Ht 6\' 2"  (1.88 m)   Wt 164 lb (74.4 kg)   SpO2 100%   BMI 21.06 kg/m    Physical Exam Vitals reviewed.  Constitutional:      General: He is not in acute distress.    Appearance: Normal appearance. He is normal weight. He is not ill-appearing, toxic-appearing or diaphoretic.  HENT:     Head: Normocephalic and atraumatic.  Eyes:     General: No scleral icterus.       Right eye: No discharge.        Left  eye: No discharge.     Conjunctiva/sclera: Conjunctivae normal.  Cardiovascular:     Rate and Rhythm: Normal rate.  Pulmonary:     Effort: Pulmonary effort is normal. No respiratory distress.  Musculoskeletal:        General: Normal range of motion.     Cervical back: Normal range of motion.  Skin:    General: Skin is warm and dry.  Neurological:     Mental Status: He is alert and oriented to person, place, and time. Mental status is at baseline.  Psychiatric:        Mood and Affect: Mood normal.        Behavior: Behavior normal.        Thought Content: Thought content normal.        Judgment: Judgment normal.

## 2020-02-26 NOTE — Progress Notes (Incomplete)
Cardiology Office Note   Date:  02/26/2020   ID:  Danny LangGeorge R Butkus Jr., DOB [birth year], MRN [id number]  PCP:  Gwenlyn FudgeJoyce, Britney F, FNP Cardiologist:  Charlton HawsPeter Samayra Hebel, MD 02/01/2019 tele-visit Electrphysiologist: None Charlton HawsPeter Jaedynn Bohlken, MD   No chief complaint on file.   History of Present Illness: Danny LangGeorge R Hyder Jr. is a 87 y.o. male with a history of  PVD s/p L CEA w/ R subclavian bpg 2009 and PTCA 2010, CAD s/p CABG 2003 w/ LIMA-LAD, RIMA-RI, SVG-OM3, PAF on coumadin, HTN, HLD, and GERD INR;s have been Rx with no bleeding issues BP is well controlled   Deferred DOAC due to cost  Had dizziness with amiodarone resolved on lower dose of 200 mg daily   Declined DCC at afib clinic appointment 11/05/19 and decided to stay On amiodarone for rate control since he felt better   TTE done 11/07/19 with EF 50-55% Mild LAE Severe RAE and RV dysfunction with severe TR Estimated PA around 60 mmHg Mild AS with mean gradient 14 peak 25 mmHg with low DVI 0.2   Duplex 11/07/19 with right ICA stenosis 80-99% but patent CCA to subclavian graft and plaque in LICA   Has had issues with excess ETOH use   Cath done 02/11/20 reviewed Essentially patent grafts with normal filling pressures and patent right subclavian bypass Graft PA 34/10 mmHg PCWP 12 mmHg CO 5.6 L/min   *** Past Medical History:  Diagnosis Date  . Anxiety   . Atrial fibrillation (HCC)   . CAD, ARTERY BYPASS GRAFT 05/19/2008   Qualifier: Diagnosis of  By: Bascom LevelsFrazier, RMA, Sherri    . Enteritis due to Clostridium difficile, history of 11/21/2012  . GERD (gastroesophageal reflux disease)   . Hyperlipidemia   . Hypertension   . Internal carotid artery stenosis   . 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 Vitamin D deficiency     Past Surgical History:  Procedure Laterality Date  . CARDIOVERSION N/A 05/05/2016   Procedure: CARDIOVERSION;  Surgeon: Wendall StadePeter C Alexza Norbeck, MD;  [facility];  Service: Cardiovascular;   Laterality: N/A;  . CAROTID ENDARTERECTOMY Left 2003  . CAROTID-SUBCLAVIAN BYPASS GRAFT Right   . COLONOSCOPY  2005   hyperplastic polyp x 1 with no adenoma  . COLONOSCOPY N/A 04/11/2014   VHQ:IONGSLF:mild diverticulosis in the sigmoid colon/left colon is redundant  . CORONARY ARTERY BYPASS GRAFT    . ESOPHAGEAL DILATION N/A 04/11/2014   Procedure: ESOPHAGEAL DILATION;  Surgeon: West BaliSandi L Fields, MD;  [facility];  Service: Endoscopy;  Laterality: N/A;  . ESOPHAGOGASTRODUODENOSCOPY N/A 04/11/2014   EXB:[id number]:stricture at the gastro junction/moderate erosive/duodenal web  . EYE SURGERY Bilateral    cataracts  . RIGHT/LEFT HEART CATH AND CORONARY/GRAFT ANGIOGRAPHY N/A 02/11/2020   Procedure: RIGHT/LEFT HEART CATH AND CORONARY/GRAFT ANGIOGRAPHY;  Surgeon: SwazilandJordan, Desiderio Dolata M, MD;  [facility];  Service: Cardiovascular;  Laterality: N/A;  . SKIN CANCER EXCISION      Current Outpatient Medications  Medication Sig Dispense Refill  . amiodarone (PACERONE) 200 MG tablet Take 0.5 tablets (100 mg total) by mouth daily. 45 tablet 3  . cholecalciferol (VITAMIN D) 25 MCG (1000 UNIT) tablet Take 2,000 Units by mouth daily with breakfast.    . diphenhydrAMINE (BENADRYL) 50 MG tablet Take 1 tablet (50 mg total) by mouth as directed. Take 1 Tablet just before leaving home on 02/10/20 for cath 1 tablet 0  . furosemide (LASIX) 20 MG tablet Take  1 tablet (20 mg total) by mouth daily. 30 tablet 2  . losartan (COZAAR) 50 MG tablet TAKE 1 TABLET EVERY MORNING (Patient taking differently: Take 50 mg by mouth daily.) 90 tablet 0  . metoprolol tartrate (LOPRESSOR) 50 MG tablet Take 1 tablet (50 mg total) by mouth 2 (two) times daily. 180 tablet 3  . Multiple Vitamin (MULTIVITAMIN WITH MINERALS) TABS tablet Take 1 tablet by mouth daily. One-A-Day for Men 50+    . Multiple Vitamins-Minerals (PRESERVISION AREDS 2 PO) Take 1 tablet by mouth in the morning and at bedtime.    . nitroGLYCERIN (NITROSTAT) 0.4 MG SL  tablet PLACE 1 TABLET (0.4 MG TOTAL) UNDER THE TONGUE EVERY 5 (FIVE) MINUTES AS NEEDED FOR CHEST PAIN. (Patient taking differently: Place 0.4 mg under the tongue every 5 (five) minutes x 3 doses as needed.) 100 tablet 3  . Omega-3 Fatty Acids (FISH OIL) 1200 MG CAPS Take 2,400 mg by mouth daily.    [name] omeprazole (PRILOSEC) 20 MG capsule TAKE 1 CAPSULE TWICE DAILY (Patient taking differently: Take 20 mg by mouth in the morning and at bedtime. 30 minutes before breakfast & supper) 180 capsule 3  . Probiotic Product (PHILLIPS COLON HEALTH PO) Take 1 capsule by mouth daily.    [name] warfarin (COUMADIN) 5 MG tablet Take 1 tablet (5 mg total) by mouth daily. 90 tablet 3   No current facility-administered medications for this visit.    Allergies:   Doxycycline, Contrast media [iodinated diagnostic agents], Lunesta [eszopiclone], Morphine and related, and Trazodone and nefazodone    Social History:  The patient  reports that he quit smoking about 59 years ago. His smoking use included cigarettes. He started smoking about 70 years ago. He has never used smokeless tobacco. He reports previous alcohol use. He reports that he does not use drugs.   Family History:  The patient's family history includes Diabetes in his son; Heart disease in his mother; Kidney disease in his sister; Lung cancer in his brother; Mental illness in his sister; Other in his sister; Stroke in his father, mother, and son.  He indicated that his mother is deceased. He indicated that his father is deceased. He indicated that all of his three sisters are deceased. He indicated that his brother is deceased. He indicated that his maternal grandmother is deceased. He indicated that his maternal grandfather is deceased. He indicated that his paternal grandmother is deceased. He indicated that his paternal grandfather is deceased. He indicated that his son is alive. He indicated that the status of his neg hx is unknown.   ROS:  Please see the  history of present illness. All other systems are reviewed and negative.    PHYSICAL EXAM: VS:  There were no vitals taken for this visit. , BMI There is no height or weight on file to calculate BMI.  Affect appropriate Healthy:  appears stated age 48: normal Neck supple with no adenopathy JVP elevated bilateral  bruits no thyromegaly Lungs clear with no wheezing and good diaphragmatic motion Heart:  S1/S2 AS  murmur, no rub, gallop or click PMI normal Abdomen: benighn, BS positve, no tenderness, no AAA no bruit.  No HSM or HJR Distal pulses intact with no bruits No edema Neuro non-focal Skin warm and dry No muscular weakness    EKG:   Afib rate 85 poor R wave progression 11/05/19   ECHO: 11/07/19 1. Left ventricular ejection fraction, by estimation, is 50 to 55%. The  left ventricle has low  normal function. The left ventricle has no regional  wall motion abnormalities. Left ventricular diastolic parameters are  indeterminate.  2. Ventricular septum is flattened in diastole suggesting RV volume  overload. . Right ventricular systolic function is mildly reduced. The  right ventricular size is severely enlarged.  3. Left atrial size was mildly dilated.  4. Right atrial size was severely dilated.  5. The mitral valve is normal in structure. Trivial mitral valve  regurgitation. No evidence of mitral stenosis.  6. Tricuspid valve regurgitation is severe.  7. The aortic valve has an indeterminant number of cusps. There is  moderate calcification of the aortic valve. There is moderate thickening  of the aortic valve. Aortic valve regurgitation is not visualized. No  aortic stenosis is present.  8. Moderate pulmonary HTN, PASP is 46 mmHg.  9. The inferior vena cava is dilated in size with >50% respiratory  variability, suggesting right atrial pressure of 8 mmHg.   CATH: 07/25/2001 It became obvious as soon as we placed the Judkins left 4 catheter in the patients  aortic root that there was severe damping to the left main coronary artery only limited injections could be done as the patient became significantly bradycardic and hypotensive.  Flush injections revealed a 95% left main stenosis.  We did take two injections of the left main with pullback on the catheter to reveal the distal vessels.  The LAD appeared to be patent with two diagonal branches of medium size.  The circumflex system appeared to consist mainly of one AV groove branch and an OM branch.  The OM branch had 50% multiple discrete lesions in it.  The distal left main also had a 50% tubular stenosis.  Imaging of the right coronary artery showed a 30% distal lesion.  Interestingly, there were right to left collaterals to the LAD which suggested the left main was critically tight requiring collateralization of the LAD.  RAO ventriculography revealed normal LV function with an EF of 60%.  There was no gradient across the aortic valve and no MR.  Aortic pressure was in the 180/70 range with LV pressure in the 180/28 range.  Left subclavian and internal mammary artery projections shows it to be widely patent and a good vessel for bypass.  IMPRESSION:  The patient will be started on oxygen as well as heparin and nitroglycerin.  CVTS has been called and hopefully we can operate on the patient within the next 24 to 36 hours. Dictated by:   Wallis Bamberg Johnsie Cancel, M.D. [facility]  CAROTID AND SUBCLAVIAN DOPPLERS: 07/28/2017 Final Interpretation:  Right Carotid: Velocities in the right ICA are consistent with a 40-59%         stenosis. The ECA appears >50% stenosed.  Right Graft:(s) Patent right CCA to subclavian artery bypass graft, with  severely elevated velocities at the CCA anastomosis, and elevated  velocities throughout.    Left Carotid: Velocities in the left ICA are consistent with a 1-39%  stenosis.   Vertebrals: Bilateral vertebral arteries demonstrate antegrade flow.   Subclavians: Right subclavian artery flow was disturbed. Normal flow        hemodynamics were seen in the left subclavian artery.   SUBCLAVIAN Right: No significant arterial obstruction detected in the right     upper extremity.  Left: No significant arterial obstruction detected in the left upper    extremity.   MYOVIEW: 08/01/2006 STRESS MYOVIEW STUDY:  Patient exercised for 5 minutes on the standard Bruce protocol. Exercise was stopped somewhat abruptly  due to acute shortness of breath. The patient had a millimeter and a half of ST segment depression in the inferior lateral leads. He had frequent PACs and PVCs. We managed to run the treadmill at a low level speed for another 30 seconds to make sure the Myoview circulated. His baseline EKG showed sinus rhythm with nonspecific ST-T wave changes and borderline LVH. There were flat ST segments in the inferior lateral leads; however, he did have an additional 2 mm of ST segment depression and T-wave inversions in the inferior lateral leads with stress.   Overall, the EKG was thought to be nondiagnostic due to baseline changes.   The patient's baseline heart rate was 65. His blood pressure is 162/78 in the left arm. In the right arm, it was 109/72 consistent with his known subclavian disease.   With stress, the heart rate increased to a maximum of 133 and blood pressure was 210/82.   SCINTIGRAPHIC RESULTS: Images were reconstructed in the short axis, vertical, and horizontal long axis. Resting and stress images were normal. There is no evidence of ischemia or infarction. The gated ejection fraction was 64%.  IMPRESSION:  1. Clinically abnormal stress test with acute onset of shortness of breath, abnormal EKG; however, scintigraphic images are low risk with no evidence of ischemia or infarction. The gated ejection fraction is 64%. The patient will be followed closely clinically. He is not having significant pain.   2. There was  also no evidence of LV cavity dilatation with stress to suggest balanced ischemia.    Recent Labs: 10/14/2019: ALT 32; TSH 0.969 02/06/2020: BUN 22; Creatinine, Ser 1.01; Platelets 279 02/11/2020: Hemoglobin 14.3; Potassium 3.9; Sodium 134  CBC    Component Value Date/Time   WBC 6.9 02/06/2020 1012   RBC 4.11 (L) 02/06/2020 1012   HGB 14.3 02/11/2020 1031   HGB 15.2 02/20/2019 0815   HCT 42.0 02/11/2020 1031   HCT 45.5 02/20/2019 0815   PLT 279 02/06/2020 1012   PLT 239 02/20/2019 0815   MCV 97.8 02/06/2020 1012   MCV 95 02/20/2019 0815   MCH 32.8 02/06/2020 1012   MCHC 33.6 02/06/2020 1012   RDW 15.4 02/06/2020 1012   RDW 12.9 02/20/2019 0815   LYMPHSABS 1.4 02/20/2019 0815   MONOABS 1.8 (H) 09/19/2012 1915   EOSABS 0.2 02/20/2019 0815   BASOSABS 0.1 02/20/2019 0815   CMP Latest Ref Rng & Units 02/11/2020 02/11/2020 02/06/2020  Glucose 70 - 99 mg/dL - - 94  BUN 8 - 23 mg/dL - - 22  Creatinine 2.97 - 1.24 mg/dL - - 9.89  Sodium 211 - 145 mmol/L 134(L) 134(L) 134(L)  Potassium 3.5 - 5.1 mmol/L 3.9 3.9 4.0  Chloride 98 - 111 mmol/L - - 95(L)  CO2 22 - 32 mmol/L - - 30  Calcium 8.9 - 10.3 mg/dL - - 9.1  Total Protein 6.5 - 8.1 g/dL - - -  Total Bilirubin 0.3 - 1.2 mg/dL - - -  Alkaline Phos 38 - 126 U/L - - -  AST 15 - 41 U/L - - -  ALT 0 - 44 U/L - - -   Lab Results  Component Value Date   INR 1.4 (H) 02/21/2020   INR 1.4 (H) 02/14/2020   INR 1.2 02/11/2020    Lipid Panel Lab Results  Component Value Date   CHOL 170 02/20/2019   HDL 87 02/20/2019   LDLCALC 71 02/20/2019   TRIG 60 02/20/2019   CHOLHDL 2.0 02/20/2019  Wt Readings from Last 3 Encounters:  02/21/20 74.4 kg  02/14/20 77.7 kg  02/11/20 75.8 kg     Other studies Reviewed: Additional studies/ records that were reviewed today include: Office notes, hospital records and testing.  ASSESSMENT AND PLAN:  1.  DOE:  - EF 50-55% by TTE 10/14 cath with patent grafts normal CO And normal filling  pressures with no pulmonary HTN continue medical Rx   2.  Persistent atrial fibrillation -chronic on anticoagulation rate control ok  He likes low dose amiodarone for rate control   3.  Hypertension -continue losartan and beta blocker as well as lasix   4.  Carotid disease, history of subclavian disease -Duplex 11/07/19 with patent right CCA to subclavian bypass and plaque in L ICA bypass widely patent at recent cath    F/U 6 months    Disposition:   FU with [name], MD  Signed, [name], MD  02/26/2020 8:24 AM    [place] Phone: [phone]; Fax: [phone]

## 2020-03-04 ENCOUNTER — Other Ambulatory Visit: Payer: Self-pay | Admitting: Family Medicine

## 2020-03-04 DIAGNOSIS — I10 Essential (primary) hypertension: Secondary | ICD-10-CM

## 2020-03-05 ENCOUNTER — Encounter: Payer: Self-pay | Admitting: Family Medicine

## 2020-03-05 ENCOUNTER — Other Ambulatory Visit: Payer: Self-pay

## 2020-03-05 ENCOUNTER — Ambulatory Visit (INDEPENDENT_AMBULATORY_CARE_PROVIDER_SITE_OTHER): Payer: Medicare HMO | Admitting: Family Medicine

## 2020-03-05 VITALS — BP 139/76 | HR 88 | Temp 96.9°F | Ht 74.0 in | Wt 165.2 lb

## 2020-03-05 DIAGNOSIS — R600 Localized edema: Secondary | ICD-10-CM | POA: Diagnosis not present

## 2020-03-05 DIAGNOSIS — I480 Paroxysmal atrial fibrillation: Secondary | ICD-10-CM

## 2020-03-05 DIAGNOSIS — Z7901 Long term (current) use of anticoagulants: Secondary | ICD-10-CM

## 2020-03-05 LAB — COAGUCHEK XS/INR WAIVED
INR: 1.6 — ABNORMAL HIGH (ref 0.9–1.1)
Prothrombin Time: 19.2 s

## 2020-03-05 NOTE — Progress Notes (Signed)
Assessment & Plan:  1-2. Long term (current) use of anticoagulants/Paroxysmal atrial fibrillation [facility]) Description   INR today 1.6 (goal is 2-3).  Take extra half a tablet today and every Thursday with 5 mg all other days.  Re-check 2 weeks      - CoaguChek XS/INR Waived  3. Edema of both lower extremities - Patient reports improvement with compression hose.   Return in about 2 weeks (around 03/19/2020) for INR.  [name], MSN, APRN, FNP-C [facility]  Subjective:    Patient ID: Danny Roggenkamp., male    DOB: [birth year], 87 y.o.   MRN: [id number]  Patient Care Team: Loman Brooklyn, FNP as PCP - General (Family Medicine) Josue Hector, MD as PCP - Cardiology (Cardiology) Josue Hector, MD as Attending Physician (Cardiology) [name], MD (Inactive) as Consulting Physician (Gastroenterology) Okey Regal, OD (Optometry) Rexene Alberts, MD as Consulting Physician (Cardiothoracic Surgery) Ilean China, RN as Registered Nurse Allyn Kenner, MD as Consulting Physician (Dermatology)   Chief Complaint:  Chief Complaint  Patient presents with  . Coagulation Disorder    HPI: Danny Colvin. is a 87 y.o. male presenting on 03/05/2020 for Coagulation Disorder  Anticoagulation: Patient here for anticoagulation monitoring. Indication: atrial fibrillation Bleeding Signs/Symptoms:  None Thromboembolic Signs/Symptoms:  None  Missed Coumadin Doses:  None Medication Changes:  yes - Coumadin increased to 5 mg once daily 2 weeks ago. Dietary Changes:  no Bacterial/Viral Infection:  no  New complaints: None  Social history:  Relevant past medical, surgical, family and social history reviewed and updated as indicated. Interim medical history since our last visit reviewed.  Allergies and medications reviewed and updated.  DATA REVIEWED: CHART IN EPIC  ROS: Negative unless specifically indicated above in HPI.    Current  Outpatient Medications:  .  amiodarone (PACERONE) 200 MG tablet, Take 0.5 tablets (100 mg total) by mouth daily., Disp: 45 tablet, Rfl: 3 .  cholecalciferol (VITAMIN D) 25 MCG (1000 UNIT) tablet, Take 2,000 Units by mouth daily with breakfast., Disp: , Rfl:  .  diphenhydrAMINE (BENADRYL) 50 MG tablet, Take 1 tablet (50 mg total) by mouth as directed. Take 1 Tablet just before leaving home on 02/10/20 for cath, Disp: 1 tablet, Rfl: 0 .  furosemide (LASIX) 20 MG tablet, Take 1 tablet (20 mg total) by mouth daily., Disp: 30 tablet, Rfl: 2 .  losartan (COZAAR) 50 MG tablet, TAKE 1 TABLET EVERY MORNING, Disp: 90 tablet, Rfl: 0 .  Multiple Vitamin (MULTIVITAMIN WITH MINERALS) TABS tablet, Take 1 tablet by mouth daily. One-A-Day for Men 50+, Disp: , Rfl:  .  Multiple Vitamins-Minerals (PRESERVISION AREDS 2 PO), Take 1 tablet by mouth in the morning and at bedtime., Disp: , Rfl:  .  nitroGLYCERIN (NITROSTAT) 0.4 MG SL tablet, PLACE 1 TABLET (0.4 MG TOTAL) UNDER THE TONGUE EVERY 5 (FIVE) MINUTES AS NEEDED FOR CHEST PAIN. (Patient taking differently: Place 0.4 mg under the tongue every 5 (five) minutes x 3 doses as needed.), Disp: 100 tablet, Rfl: 3 .  Omega-3 Fatty Acids (FISH OIL) 1200 MG CAPS, Take 2,400 mg by mouth daily., Disp: , Rfl:  .  omeprazole (PRILOSEC) 20 MG capsule, TAKE 1 CAPSULE TWICE DAILY (Patient taking differently: Take 20 mg by mouth in the morning and at bedtime. 30 minutes before breakfast & supper), Disp: 180 capsule, Rfl: 3 .  Probiotic Product (PHILLIPS COLON HEALTH PO), Take 1 capsule by mouth daily., Disp: ,  Rfl:  .  warfarin (COUMADIN) 5 MG tablet, Take 1 tablet (5 mg total) by mouth daily., Disp: 90 tablet, Rfl: 3 .  metoprolol tartrate (LOPRESSOR) 50 MG tablet, Take 1 tablet (50 mg total) by mouth 2 (two) times daily., Disp: 180 tablet, Rfl: 3   Allergies  Allergen Reactions  . Doxycycline Diarrhea  . Contrast Media [Iodinated Diagnostic Agents]     Syncope  . Lunesta  [Eszopiclone]     "felt WILD"  . Morphine And Related Nausea And Vomiting  . Trazodone And Nefazodone Other (See Comments)    Extremely drowsy the next day after taking.   Past Medical History:  Diagnosis Date  . Anxiety   . Atrial fibrillation ([place])   . CAD, ARTERY BYPASS GRAFT 05/19/2008   Qualifier: Diagnosis of  By: Mare Ferrari, [place], Sherri    . Enteritis due to Clostridium difficile, history of 11/21/2012  . GERD (gastroesophageal reflux disease)   . Hyperlipidemia   . Hypertension   . Internal carotid artery stenosis   . 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 Vitamin D deficiency     Past Surgical History:  Procedure Laterality Date  . CARDIOVERSION N/A 05/05/2016   Procedure: CARDIOVERSION;  Surgeon: Josue Hector, MD;  Location: [place];  Service: Cardiovascular;  Laterality: N/A;  . CAROTID ENDARTERECTOMY Left 2003  . CAROTID-SUBCLAVIAN BYPASS GRAFT Right   . COLONOSCOPY  2005   hyperplastic polyp x 1 with no adenoma  . COLONOSCOPY N/A 04/11/2014   QMV:HQIO diverticulosis in the sigmoid colon/left colon is redundant  . CORONARY ARTERY BYPASS GRAFT    . ESOPHAGEAL DILATION N/A 04/11/2014   Procedure: ESOPHAGEAL DILATION;  Surgeon: [name], MD;  [facility];  Service: Endoscopy;  Laterality: N/A;  . ESOPHAGOGASTRODUODENOSCOPY N/A 04/11/2014   NGE:[id number] at the gastro junction/moderate erosive/duodenal web  . EYE SURGERY Bilateral    cataracts  . RIGHT/LEFT HEART CATH AND CORONARY/GRAFT ANGIOGRAPHY N/A 02/11/2020   Procedure: RIGHT/LEFT HEART CATH AND CORONARY/GRAFT ANGIOGRAPHY;  Surgeon: Martinique, Peter M, MD;  Location: [place] CV LAB;  Service: Cardiovascular;  Laterality: N/A;  . SKIN CANCER EXCISION      Social History   Socioeconomic History  . Marital status: Married    Spouse name: Danny [name]  . Number of children: 1  . Years of education: Not on file  . Highest education level: Not on file  Occupational History  .  Occupation: retired    Fish farm manager: [employer]  Tobacco Use  . Smoking status: Former Smoker    Types: Cigarettes    Start date: 01/24/1950    Quit date: 01/24/1961    Years since quitting: 59.1  . Smokeless tobacco: Never Used  Vaping Use  . Vaping Use: Never used  Substance and Sexual Activity  . Alcohol use: Not Currently    Alcohol/week: 0.0 standard drinks    Comment: not drank since 12/2009  . Drug use: No  . Sexual activity: Yes    Partners: Female  Other Topics Concern  . Not on file  Social History Narrative  . Not on file   Social Determinants of Health   Financial Resource Strain: Not on file  Food Insecurity: Not on file  Transportation Needs: Not on file  Physical Activity: Not on file  Stress: Not on file  Social Connections: Not on file  Intimate Partner Violence: Not on file        Objective:    BP 139/76  Pulse 88   Temp (!) 96.9 F (36.1 C) (Temporal)   Ht 6\' 2"  (1.88 m)   Wt 165 lb 3.2 oz (74.9 kg)   SpO2 100%   BMI 21.21 kg/m   Wt Readings from Last 3 Encounters:  03/05/20 165 lb 3.2 oz (74.9 kg)  02/21/20 164 lb (74.4 kg)  02/14/20 171 lb 3.2 oz (77.7 kg)    Physical Exam Vitals reviewed.  Constitutional:      General: He is not in acute distress.    Appearance: Normal appearance. He is not ill-appearing, toxic-appearing or diaphoretic.  HENT:     Head: Normocephalic and atraumatic.  Eyes:     General: No scleral icterus.       Right eye: No discharge.        Left eye: No discharge.     Conjunctiva/sclera: Conjunctivae normal.  Cardiovascular:     Rate and Rhythm: Normal rate and regular rhythm.     Heart sounds: Normal heart sounds. No murmur heard. No friction rub. No gallop.   Pulmonary:     Effort: Pulmonary effort is normal. No respiratory distress.     Breath sounds: Normal breath sounds. No stridor. No wheezing, rhonchi or rales.  Musculoskeletal:        General: Normal range of motion.     Cervical back: Normal range of  motion.  Skin:    General: Skin is warm and dry.  Neurological:     Mental Status: He is alert and oriented to person, place, and time. Mental status is at baseline.  Psychiatric:        Mood and Affect: Mood normal.        Behavior: Behavior normal.        Thought Content: Thought content normal.        Judgment: Judgment normal.     Lab Results  Component Value Date   TSH 0.969 10/14/2019   Lab Results  Component Value Date   WBC 6.9 02/06/2020   HGB 14.3 02/11/2020   HCT 42.0 02/11/2020   MCV 97.8 02/06/2020   PLT 279 02/06/2020   Lab Results  Component Value Date   NA 134 (L) 02/11/2020   K 3.9 02/11/2020   CO2 30 02/06/2020   GLUCOSE 94 02/06/2020   BUN 22 02/06/2020   CREATININE 1.01 02/06/2020   BILITOT 0.9 10/14/2019   ALKPHOS 110 10/14/2019   AST 30 10/14/2019   ALT 32 10/14/2019   PROT 7.0 10/14/2019   ALBUMIN 4.0 10/14/2019   CALCIUM 9.1 02/06/2020   ANIONGAP 9 02/06/2020   Lab Results  Component Value Date   CHOL 170 02/20/2019   Lab Results  Component Value Date   HDL 87 02/20/2019   Lab Results  Component Value Date   LDLCALC 71 02/20/2019   Lab Results  Component Value Date   TRIG 60 02/20/2019   Lab Results  Component Value Date   CHOLHDL 2.0 02/20/2019   No results found for: HGBA1C

## 2020-03-06 ENCOUNTER — Ambulatory Visit: Payer: Medicare HMO | Admitting: Cardiovascular Disease

## 2020-03-06 NOTE — Progress Notes (Deleted)
Cardiology Office Note   Date:  03/06/2020   ID:  Danny [name]., DOB 04/07/1931, MRN [id number]  PCP:  Loman Brooklyn, FNP Cardiologist:  [name], MD 02/01/2019 tele-visit Electrphysiologist: None [name], MD   No chief complaint on file.   History of Present Illness: Danny [name]. is a 87 y.o. male with a history of  PVD s/p L CEA w/ R subclavian bpg 2009 and PTCA 2010, CAD s/p CABG 2003 w/ LIMA-LAD, RIMA-RI, SVG-OM3, PAF on coumadin, HTN, HLD, and GERD INR;s have been Rx with no bleeding issues BP is well controlled   Deferred DOAC due to cost  Had dizziness with amiodarone resolved on lower dose of 200 mg daily   Declined [place] at [place] clinic appointment 11/05/19 and decided to stay On amiodarone for rate control since he felt better   TTE done 11/07/19 with EF 50-55% Mild LAE Severe RAE and RV dysfunction with severe TR Estimated PA around 60 mmHg Mild AS with mean gradient 14 peak 25 mmHg with low DVI 0.2   Duplex 11/07/19 with right ICA stenosis 80-99% but patent CCA to subclavian graft and plaque in LICA   Has had issues with excess ETOH use   Cath done 02/11/20 reviewed Essentially patent grafts with normal filling pressures and patent right subclavian bypass Graft PA 34/10 mmHg PCWP 12 mmHg CO 5.6 L/min   *** Past Medical History:  Diagnosis Date  . Anxiety   . Atrial fibrillation ([place])   . CAD, ARTERY BYPASS GRAFT 05/19/2008   Qualifier: Diagnosis of  By: Mare Ferrari, [place], Sherri    . Enteritis due to Clostridium difficile, history of 11/21/2012  . GERD (gastroesophageal reflux disease)   . Hyperlipidemia   . Hypertension   . Internal carotid artery stenosis   . 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 Vitamin D deficiency     Past Surgical History:  Procedure Laterality Date  . CARDIOVERSION N/A 05/05/2016   Procedure: CARDIOVERSION;  Surgeon: Josue Hector, MD;  Location: [facility];  Service: Cardiovascular;   Laterality: N/A;  . CAROTID ENDARTERECTOMY Left 2003  . CAROTID-SUBCLAVIAN BYPASS GRAFT Right   . COLONOSCOPY  2005   hyperplastic polyp x 1 with no adenoma  . COLONOSCOPY N/A 04/11/2014   Z[facility]:YIFO diverticulosis in the sigmoid colon/left colon is redundant  . CORONARY ARTERY BYPASS GRAFT    . ESOPHAGEAL DILATION N/A 04/11/2014   Procedure: ESOPHAGEAL DILATION;  Surgeon: [name], MD;  [facility];  Service: Endoscopy;  Laterality: N/A;  . ESOPHAGOGASTRODUODENOSCOPY N/A 04/11/2014   YDX:AJOINOMVE at the gastro junction/moderate erosive/duodenal web  . EYE SURGERY Bilateral    cataracts  . RIGHT/LEFT HEART CATH AND CORONARY/GRAFT ANGIOGRAPHY N/A 02/11/2020   Procedure: RIGHT/LEFT HEART CATH AND CORONARY/GRAFT ANGIOGRAPHY;  Surgeon: Martinique, Thong Feeny M, MD;  Location: [facility];  Service: Cardiovascular;  Laterality: N/A;  . SKIN CANCER EXCISION      Current Outpatient Medications  Medication Sig Dispense Refill  . amiodarone (PACERONE) 200 MG tablet Take 0.5 tablets (100 mg total) by mouth daily. 45 tablet 3  . cholecalciferol (VITAMIN D) 25 MCG (1000 UNIT) tablet Take 2,000 Units by mouth daily with breakfast.    . diphenhydrAMINE (BENADRYL) 50 MG tablet Take 1 tablet (50 mg total) by mouth as directed. Take 1 Tablet just before leaving home on 02/10/20 for cath 1 tablet 0  . furosemide (LASIX) 20 MG tablet Take  1 tablet (20 mg total) by mouth daily. 30 tablet 2  . losartan (COZAAR) 50 MG tablet TAKE 1 TABLET EVERY MORNING 90 tablet 0  . metoprolol tartrate (LOPRESSOR) 50 MG tablet Take 1 tablet (50 mg total) by mouth 2 (two) times daily. 180 tablet 3  . Multiple Vitamin (MULTIVITAMIN WITH MINERALS) TABS tablet Take 1 tablet by mouth daily. One-A-Day for Men 50+    . Multiple Vitamins-Minerals (PRESERVISION AREDS 2 PO) Take 1 tablet by mouth in the morning and at bedtime.    . nitroGLYCERIN (NITROSTAT) 0.4 MG SL tablet PLACE 1 TABLET (0.4 MG TOTAL) UNDER THE TONGUE  EVERY 5 (FIVE) MINUTES AS NEEDED FOR CHEST PAIN. (Patient taking differently: Place 0.4 mg under the tongue every 5 (five) minutes x 3 doses as needed.) 100 tablet 3  . Omega-3 Fatty Acids (FISH OIL) 1200 MG CAPS Take 2,400 mg by mouth daily.    [name] omeprazole (PRILOSEC) 20 MG capsule TAKE 1 CAPSULE TWICE DAILY (Patient taking differently: Take 20 mg by mouth in the morning and at bedtime. 30 minutes before breakfast & supper) 180 capsule 3  . Probiotic Product (PHILLIPS COLON HEALTH PO) Take 1 capsule by mouth daily.    [name] warfarin (COUMADIN) 5 MG tablet Take 1 tablet (5 mg total) by mouth daily. 90 tablet 3   No current facility-administered medications for this visit.    Allergies:   Doxycycline, Contrast media [iodinated diagnostic agents], Lunesta [eszopiclone], Morphine and related, and Trazodone and nefazodone    Social History:  The patient  reports that he quit smoking about 59 years ago. His smoking use included cigarettes. He started smoking about 70 years ago. He has never used smokeless tobacco. He reports previous alcohol use. He reports that he does not use drugs.   Family History:  The patient's family history includes Diabetes in his son; Heart disease in his mother; Kidney disease in his sister; Lung cancer in his brother; Mental illness in his sister; Other in his sister; Stroke in his father, mother, and son.  He indicated that his mother is deceased. He indicated that his father is deceased. He indicated that all of his three sisters are deceased. He indicated that his brother is deceased. He indicated that his maternal grandmother is deceased. He indicated that his maternal grandfather is deceased. He indicated that his paternal grandmother is deceased. He indicated that his paternal grandfather is deceased. He indicated that his son is alive. He indicated that the status of his neg hx is unknown.   ROS:  Please see the history of present illness. All other systems are reviewed  and negative.    PHYSICAL EXAM: VS:  There were no vitals taken for this visit. , BMI There is no height or weight on file to calculate BMI.  Affect appropriate Healthy:  appears stated age 17: normal Neck supple with no adenopathy JVP elevated bilateral  bruits no thyromegaly Lungs clear with no wheezing and good diaphragmatic motion Heart:  S1/S2 AS  murmur, no rub, gallop or click PMI normal Abdomen: benighn, BS positve, no tenderness, no AAA no bruit.  No HSM or HJR Distal pulses intact with no bruits No edema Neuro non-focal Skin warm and dry No muscular weakness    EKG:   Afib rate 85 poor R wave progression 11/05/19   ECHO: 11/07/19 1. Left ventricular ejection fraction, by estimation, is 50 to 55%. The  left ventricle has low normal function. The left ventricle has no regional  wall motion abnormalities. Left ventricular diastolic parameters are  indeterminate.  2. Ventricular septum is flattened in diastole suggesting RV volume  overload. . Right ventricular systolic function is mildly reduced. The  right ventricular size is severely enlarged.  3. Left atrial size was mildly dilated.  4. Right atrial size was severely dilated.  5. The mitral valve is normal in structure. Trivial mitral valve  regurgitation. No evidence of mitral stenosis.  6. Tricuspid valve regurgitation is severe.  7. The aortic valve has an indeterminant number of cusps. There is  moderate calcification of the aortic valve. There is moderate thickening  of the aortic valve. Aortic valve regurgitation is not visualized. No  aortic stenosis is present.  8. Moderate pulmonary HTN, PASP is 46 mmHg.  9. The inferior vena cava is dilated in size with >50% respiratory  variability, suggesting right atrial pressure of 8 mmHg.   CATH: 07/25/2001 It became obvious as soon as we placed the Judkins left 4 catheter in the patients aortic root that there was severe damping to the left main  coronary artery only limited injections could be done as the patient became significantly bradycardic and hypotensive.  Flush injections revealed a 95% left main stenosis.  We did take two injections of the left main with pullback on the catheter to reveal the distal vessels.  The LAD appeared to be patent with two diagonal branches of medium size.  The circumflex system appeared to consist mainly of one AV groove branch and an OM branch.  The OM branch had 50% multiple discrete lesions in it.  The distal left main also had a 50% tubular stenosis.  Imaging of the right coronary artery showed a 30% distal lesion.  Interestingly, there were right to left collaterals to the LAD which suggested the left main was critically tight requiring collateralization of the LAD.  RAO ventriculography revealed normal LV function with an EF of 60%.  There was no gradient across the aortic valve and no MR.  Aortic pressure was in the 180/70 range with LV pressure in the 180/28 range.  Left subclavian and internal mammary artery projections shows it to be widely patent and a good vessel for bypass.  IMPRESSION:  The patient will be started on oxygen as well as heparin and nitroglycerin.  CVTS has been called and hopefully we can operate on the patient within the next 24 to 36 hours. Dictated by:   Wallis Bamberg Johnsie Cancel, M.D. [facility]  CAROTID AND SUBCLAVIAN DOPPLERS: 07/28/2017 Final Interpretation:  Right Carotid: Velocities in the right ICA are consistent with a 40-59%         stenosis. The ECA appears >50% stenosed.  Right Graft:(s) Patent right CCA to subclavian artery bypass graft, with  severely elevated velocities at the CCA anastomosis, and elevated  velocities throughout.    Left Carotid: Velocities in the left ICA are consistent with a 1-39%  stenosis.   Vertebrals: Bilateral vertebral arteries demonstrate antegrade flow.  Subclavians: Right subclavian artery flow was disturbed.  Normal flow        hemodynamics were seen in the left subclavian artery.   SUBCLAVIAN Right: No significant arterial obstruction detected in the right     upper extremity.  Left: No significant arterial obstruction detected in the left upper    extremity.   MYOVIEW: 08/01/2006 STRESS MYOVIEW STUDY:  Patient exercised for 5 minutes on the standard Bruce protocol. Exercise was stopped somewhat abruptly due to acute shortness of breath. The patient had  a millimeter and a half of ST segment depression in the inferior lateral leads. He had frequent PACs and PVCs. We managed to run the treadmill at a low level speed for another 30 seconds to make sure the Myoview circulated. His baseline EKG showed sinus rhythm with nonspecific ST-T wave changes and borderline LVH. There were flat ST segments in the inferior lateral leads; however, he did have an additional 2 mm of ST segment depression and T-wave inversions in the inferior lateral leads with stress.   Overall, the EKG was thought to be nondiagnostic due to baseline changes.   The patient's baseline heart rate was 65. His blood pressure is 162/78 in the left arm. In the right arm, it was 109/72 consistent with his known subclavian disease.   With stress, the heart rate increased to a maximum of 133 and blood pressure was 210/82.   SCINTIGRAPHIC RESULTS: Images were reconstructed in the short axis, vertical, and horizontal long axis. Resting and stress images were normal. There is no evidence of ischemia or infarction. The gated ejection fraction was 64%.  IMPRESSION:  1. Clinically abnormal stress test with acute onset of shortness of breath, abnormal EKG; however, scintigraphic images are low risk with no evidence of ischemia or infarction. The gated ejection fraction is 64%. The patient will be followed closely clinically. He is not having significant pain.   2. There was also no evidence of LV cavity dilatation with stress to  suggest balanced ischemia.    Recent Labs: 10/14/2019: ALT 32; TSH 0.969 02/06/2020: BUN 22; Creatinine, Ser 1.01; Platelets 279 02/11/2020: Hemoglobin 14.3; Potassium 3.9; Sodium 134  CBC    Component Value Date/Time   WBC 6.9 02/06/2020 1012   RBC 4.11 (L) 02/06/2020 1012   HGB 14.3 02/11/2020 1031   HGB 15.2 02/20/2019 0815   HCT 42.0 02/11/2020 1031   HCT 45.5 02/20/2019 0815   PLT 279 02/06/2020 1012   PLT 239 02/20/2019 0815   MCV 97.8 02/06/2020 1012   MCV 95 02/20/2019 0815   MCH 32.8 02/06/2020 1012   MCHC 33.6 02/06/2020 1012   RDW 15.4 02/06/2020 1012   RDW 12.9 02/20/2019 0815   LYMPHSABS 1.4 02/20/2019 0815   MONOABS 1.8 (H) 09/19/2012 1915   EOSABS 0.2 02/20/2019 0815   BASOSABS 0.1 02/20/2019 0815   CMP Latest Ref Rng & Units 02/11/2020 02/11/2020 02/06/2020  Glucose 70 - 99 mg/dL - - 94  BUN 8 - 23 mg/dL - - 22  Creatinine 0.61 - 1.24 mg/dL - - 1.01  Sodium 135 - 145 mmol/L 134(L) 134(L) 134(L)  Potassium 3.5 - 5.1 mmol/L 3.9 3.9 4.0  Chloride 98 - 111 mmol/L - - 95(L)  CO2 22 - 32 mmol/L - - 30  Calcium 8.9 - 10.3 mg/dL - - 9.1  Total Protein 6.5 - 8.1 g/dL - - -  Total Bilirubin 0.3 - 1.2 mg/dL - - -  Alkaline Phos 38 - 126 U/L - - -  AST 15 - 41 U/L - - -  ALT 0 - 44 U/L - - -   Lab Results  Component Value Date   INR 1.6 (H) 03/05/2020   INR 1.4 (H) 02/21/2020   INR 1.4 (H) 02/14/2020    Lipid Panel Lab Results  Component Value Date   CHOL 170 02/20/2019   HDL 87 02/20/2019   LDLCALC 71 02/20/2019   TRIG 60 02/20/2019   CHOLHDL 2.0 02/20/2019      Wt Readings from Last 3  Encounters:  03/05/20 74.9 kg  02/21/20 74.4 kg  02/14/20 77.7 kg     Other studies Reviewed: Additional studies/ records that were reviewed today include: Office notes, hospital records and testing.  ASSESSMENT AND PLAN:  1.  DOE:  - EF 50-55% by TTE 10/14 cath with patent grafts normal CO And normal filling pressures with no pulmonary HTN continue medical Rx    2.  Persistent atrial fibrillation -chronic on anticoagulation rate control ok  He likes low dose amiodarone for rate control   3.  Hypertension -continue losartan and beta blocker as well as lasix   4.  Carotid disease, history of subclavian disease -Duplex 11/07/19 with patent right CCA to subclavian bypass and plaque in L ICA bypass widely patent at recent cath    F/U 6 months    Disposition:   FU 6 months  Signed, [name], MD  03/06/2020 9:53 AM    [place] Phone: [phone]; Fax: [phone]

## 2020-03-11 ENCOUNTER — Ambulatory Visit: Payer: Medicare HMO | Admitting: Cardiovascular Disease

## 2020-03-20 ENCOUNTER — Ambulatory Visit (INDEPENDENT_AMBULATORY_CARE_PROVIDER_SITE_OTHER): Payer: Medicare HMO | Admitting: Family Medicine

## 2020-03-20 ENCOUNTER — Encounter: Payer: Self-pay | Admitting: Family Medicine

## 2020-03-20 ENCOUNTER — Other Ambulatory Visit: Payer: Self-pay

## 2020-03-20 VITALS — BP 116/72 | HR 80 | Temp 96.8°F | Ht 74.0 in | Wt 163.4 lb

## 2020-03-20 DIAGNOSIS — Z7901 Long term (current) use of anticoagulants: Secondary | ICD-10-CM

## 2020-03-20 DIAGNOSIS — I480 Paroxysmal atrial fibrillation: Secondary | ICD-10-CM | POA: Diagnosis not present

## 2020-03-20 LAB — COAGUCHEK XS/INR WAIVED
INR: 1.5 — ABNORMAL HIGH (ref 0.9–1.1)
Prothrombin Time: 18.4 s

## 2020-03-20 NOTE — Progress Notes (Signed)
Assessment & Plan:  1-2. Long term (current) use of anticoagulants/Paroxysmal atrial fibrillation [facility]) Description   INR today 1.5 (goal is 2-3).  Take 7.5 every Tuesday, Thursday, and Saturday, with 5 mg Sunday, Monday, Wednesday, and Friday. This is an increase from 7.5 only on Thursdays.   Re-check 2 weeks      - CoaguChek XS/INR Waived  Return in about 2 weeks (around 04/03/2020) for INR.  [name], MSN, APRN, FNP-C [facility]  Subjective:    Patient ID: Danny [name]., male    DOB: 1931/07/07, 87 y.o.   MRN: [id number]  Patient Care Team: Loman Brooklyn, FNP as PCP - General (Family Medicine) Josue Hector, MD as PCP - Cardiology (Cardiology) Josue Hector, MD as Attending Physician (Cardiology) [name], MD (Inactive) as Consulting Physician (Gastroenterology) Okey Regal, OD (Optometry) Rexene Alberts, MD as Consulting Physician (Cardiothoracic Surgery) Ilean China, RN as Registered Nurse Allyn Kenner, MD as Consulting Physician (Dermatology)   Chief Complaint:  Chief Complaint  Patient presents with  . Coagulation Disorder    HPI: Danny [name]. is a 87 y.o. male presenting on 03/20/2020 for Coagulation Disorder  Anticoagulation: Patient here for anticoagulation monitoring. Indication: atrial fibrillation Bleeding Signs/Symptoms:  None Thromboembolic Signs/Symptoms:  None  Missed Coumadin Doses:  None Medication Changes:  no Dietary Changes:  no Bacterial/Viral Infection:  no  New complaints: None  Social history:  Relevant past medical, surgical, family and social history reviewed and updated as indicated. Interim medical history since our last visit reviewed.  Allergies and medications reviewed and updated.  DATA REVIEWED: CHART IN EPIC  ROS: Negative unless specifically indicated above in HPI.    Current Outpatient Medications:  .  amiodarone (PACERONE) 200 MG tablet, Take 0.5 tablets  (100 mg total) by mouth daily., Disp: 45 tablet, Rfl: 3 .  cholecalciferol (VITAMIN D) 25 MCG (1000 UNIT) tablet, Take 2,000 Units by mouth daily with breakfast., Disp: , Rfl:  .  diphenhydrAMINE (BENADRYL) 50 MG tablet, Take 1 tablet (50 mg total) by mouth as directed. Take 1 Tablet just before leaving home on 02/10/20 for cath, Disp: 1 tablet, Rfl: 0 .  furosemide (LASIX) 20 MG tablet, Take 1 tablet (20 mg total) by mouth daily., Disp: 30 tablet, Rfl: 2 .  losartan (COZAAR) 50 MG tablet, TAKE 1 TABLET EVERY MORNING, Disp: 90 tablet, Rfl: 0 .  Multiple Vitamin (MULTIVITAMIN WITH MINERALS) TABS tablet, Take 1 tablet by mouth daily. One-A-Day for Men 50+, Disp: , Rfl:  .  Multiple Vitamins-Minerals (PRESERVISION AREDS 2 PO), Take 1 tablet by mouth in the morning and at bedtime., Disp: , Rfl:  .  nitroGLYCERIN (NITROSTAT) 0.4 MG SL tablet, PLACE 1 TABLET (0.4 MG TOTAL) UNDER THE TONGUE EVERY 5 (FIVE) MINUTES AS NEEDED FOR CHEST PAIN. (Patient taking differently: Place 0.4 mg under the tongue every 5 (five) minutes x 3 doses as needed.), Disp: 100 tablet, Rfl: 3 .  Omega-3 Fatty Acids (FISH OIL) 1200 MG CAPS, Take 2,400 mg by mouth daily., Disp: , Rfl:  .  omeprazole (PRILOSEC) 20 MG capsule, TAKE 1 CAPSULE TWICE DAILY (Patient taking differently: Take 20 mg by mouth in the morning and at bedtime. 30 minutes before breakfast & supper), Disp: 180 capsule, Rfl: 3 .  Probiotic Product (PHILLIPS COLON HEALTH PO), Take 1 capsule by mouth daily., Disp: , Rfl:  .  warfarin (COUMADIN) 5 MG tablet, Take 1 tablet (5 mg total) by  mouth daily., Disp: 90 tablet, Rfl: 3 .  metoprolol tartrate (LOPRESSOR) 50 MG tablet, Take 1 tablet (50 mg total) by mouth 2 (two) times daily., Disp: 180 tablet, Rfl: 3   Allergies  Allergen Reactions  . Doxycycline Diarrhea  . Contrast Media [Iodinated Diagnostic Agents]     Syncope  . Lunesta [Eszopiclone]     "felt WILD"  . Morphine And Related Nausea And Vomiting  . Trazodone  And Nefazodone Other (See Comments)    Extremely drowsy the next day after taking.   Past Medical History:  Diagnosis Date  . Anxiety   . Atrial fibrillation ([place])   . CAD, ARTERY BYPASS GRAFT 05/19/2008   Qualifier: Diagnosis of  By: Mare Ferrari, [place], Sherri    . Enteritis due to Clostridium difficile, history of 11/21/2012  . GERD (gastroesophageal reflux disease)   . Hyperlipidemia   . Hypertension   . Internal carotid artery stenosis   . 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 Vitamin D deficiency     Past Surgical History:  Procedure Laterality Date  . CARDIOVERSION N/A 05/05/2016   Procedure: CARDIOVERSION;  Surgeon: Josue Hector, MD;  Location: [facility];  Service: Cardiovascular;  Laterality: N/A;  . CAROTID ENDARTERECTOMY Left 2003  . CAROTID-SUBCLAVIAN BYPASS GRAFT Right   . COLONOSCOPY  2005   hyperplastic polyp x 1 with no adenoma  . COLONOSCOPY N/A 04/11/2014   [facility]:HENI diverticulosis in the sigmoid colon/left colon is redundant  . CORONARY ARTERY BYPASS GRAFT    . ESOPHAGEAL DILATION N/A 04/11/2014   Procedure: ESOPHAGEAL DILATION;  Surgeon: [name], MD;  [facility];  Service: Endoscopy;  Laterality: N/A;  . ESOPHAGOGASTRODUODENOSCOPY N/A 04/11/2014   DPO:EUMPNTIRW at the gastro junction/moderate erosive/duodenal web  . EYE SURGERY Bilateral    cataracts  . RIGHT/LEFT HEART CATH AND CORONARY/GRAFT ANGIOGRAPHY N/A 02/11/2020   Procedure: RIGHT/LEFT HEART CATH AND CORONARY/GRAFT ANGIOGRAPHY;  Surgeon: Martinique, Peter M, MD;  Location: [facility];  Service: Cardiovascular;  Laterality: N/A;  . SKIN CANCER EXCISION      Social History   Socioeconomic History  . Marital status: Married    Spouse name: ruby  . Number of children: 1  . Years of education: Not on file  . Highest education level: Not on file  Occupational History  . Occupation: retired    Fish farm manager: [employer]  Tobacco Use  . Smoking status: Former Smoker     Types: Cigarettes    Start date: 01/24/1950    Quit date: 01/24/1961    Years since quitting: 59.1  . Smokeless tobacco: Never Used  Vaping Use  . Vaping Use: Never used  Substance and Sexual Activity  . Alcohol use: Not Currently    Alcohol/week: 0.0 standard drinks    Comment: not drank since 12/2009  . Drug use: No  . Sexual activity: Yes    Partners: Female  Other Topics Concern  . Not on file  Social History Narrative  . Not on file   Social Determinants of Health   Financial Resource Strain: Not on file  Food Insecurity: Not on file  Transportation Needs: Not on file  Physical Activity: Not on file  Stress: Not on file  Social Connections: Not on file  Intimate Partner Violence: Not on file        Objective:    BP 116/72   Pulse 80   Temp (!) 96.8 F (36 C) (Temporal)   Ht  6\' 2"  (1.88 m)   Wt 163 lb 6.4 oz (74.1 kg)   SpO2 97%   BMI 20.98 kg/m   Wt Readings from Last 3 Encounters:  03/20/20 163 lb 6.4 oz (74.1 kg)  03/05/20 165 lb 3.2 oz (74.9 kg)  02/21/20 164 lb (74.4 kg)    Physical Exam Vitals reviewed.  Constitutional:      General: He is not in acute distress.    Appearance: Normal appearance. He is not ill-appearing, toxic-appearing or diaphoretic.  HENT:     Head: Normocephalic and atraumatic.  Eyes:     General: No scleral icterus.       Right eye: No discharge.        Left eye: No discharge.     Conjunctiva/sclera: Conjunctivae normal.  Cardiovascular:     Rate and Rhythm: Normal rate.  Pulmonary:     Effort: Pulmonary effort is normal. No respiratory distress.  Musculoskeletal:        General: Normal range of motion.     Cervical back: Normal range of motion.  Skin:    General: Skin is warm and dry.  Neurological:     Mental Status: He is alert and oriented to person, place, and time. Mental status is at baseline.  Psychiatric:        Mood and Affect: Mood normal.        Behavior: Behavior normal.        Thought Content:  Thought content normal.        Judgment: Judgment normal.     Lab Results  Component Value Date   TSH 0.969 10/14/2019   Lab Results  Component Value Date   WBC 6.9 02/06/2020   HGB 14.3 02/11/2020   HCT 42.0 02/11/2020   MCV 97.8 02/06/2020   PLT 279 02/06/2020   Lab Results  Component Value Date   NA 134 (L) 02/11/2020   K 3.9 02/11/2020   CO2 30 02/06/2020   GLUCOSE 94 02/06/2020   BUN 22 02/06/2020   CREATININE 1.01 02/06/2020   BILITOT 0.9 10/14/2019   ALKPHOS 110 10/14/2019   AST 30 10/14/2019   ALT 32 10/14/2019   PROT 7.0 10/14/2019   ALBUMIN 4.0 10/14/2019   CALCIUM 9.1 02/06/2020   ANIONGAP 9 02/06/2020   Lab Results  Component Value Date   CHOL 170 02/20/2019   Lab Results  Component Value Date   HDL 87 02/20/2019   Lab Results  Component Value Date   LDLCALC 71 02/20/2019   Lab Results  Component Value Date   TRIG 60 02/20/2019   Lab Results  Component Value Date   CHOLHDL 2.0 02/20/2019   No results found for: HGBA1C

## 2020-03-27 IMAGING — DX DG OS CALCIS 2+V*L*
2 series · 2 of 2 positions shown · non-contrast
Comparison: None.

CLINICAL DATA: Pain after trauma.

EXAM:
LEFT OS CALCIS - 2+ VIEW

[calcaneus axial]
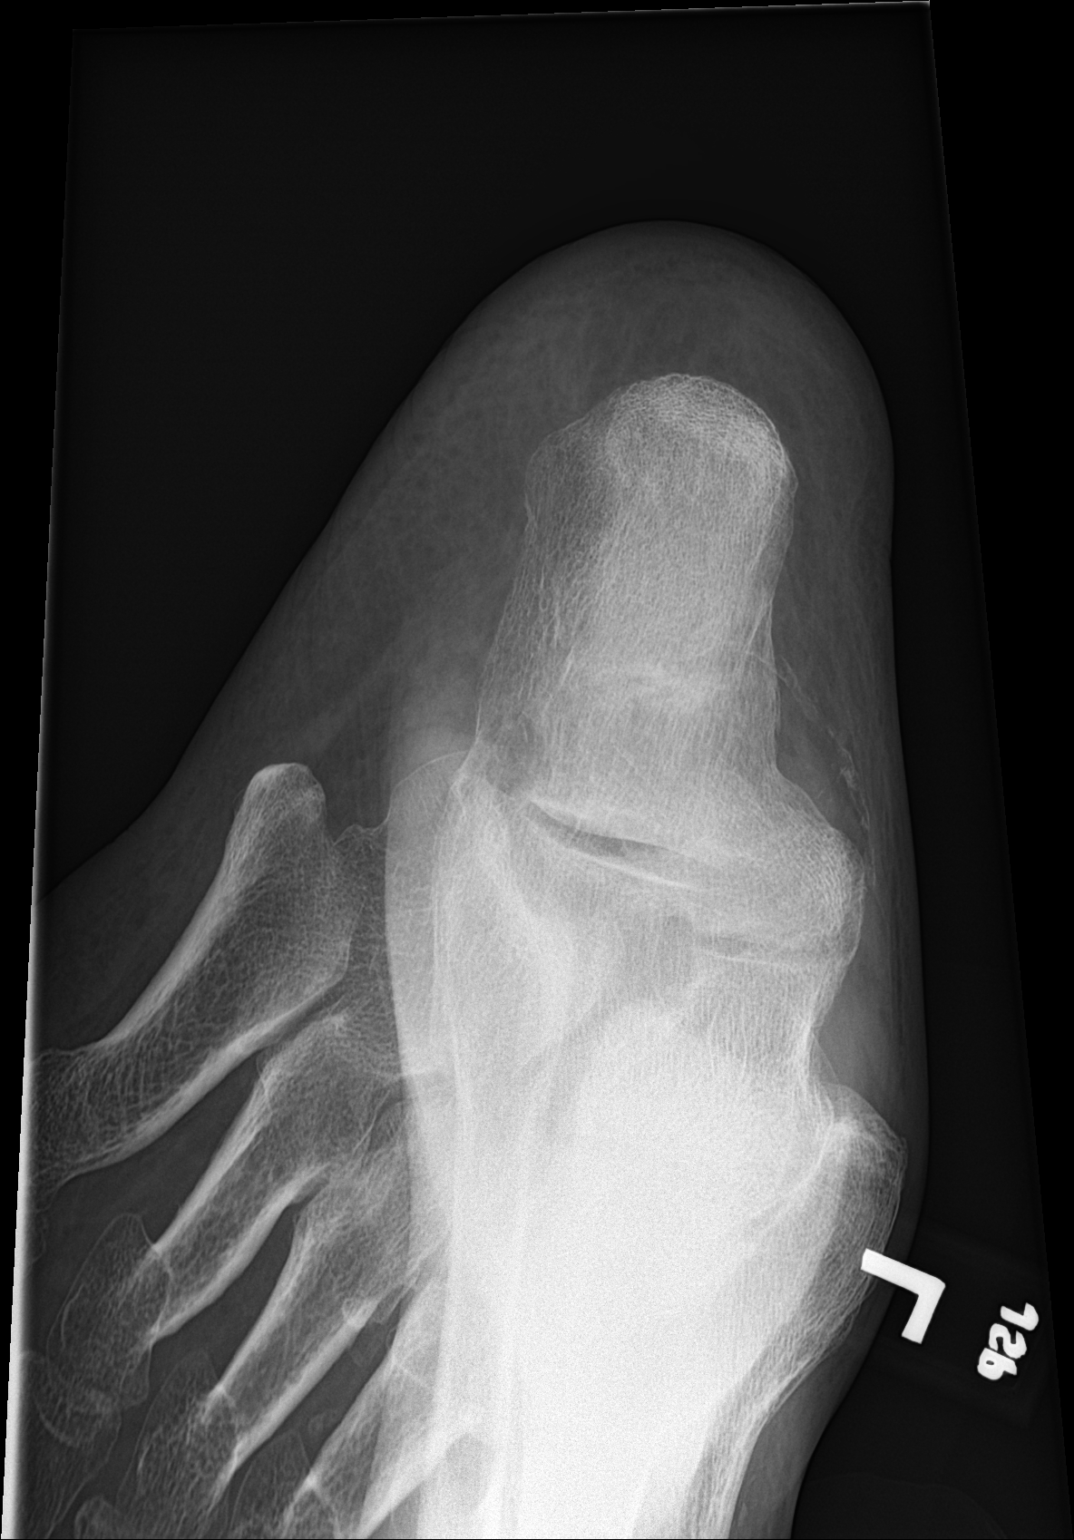

[calcaneus lat]
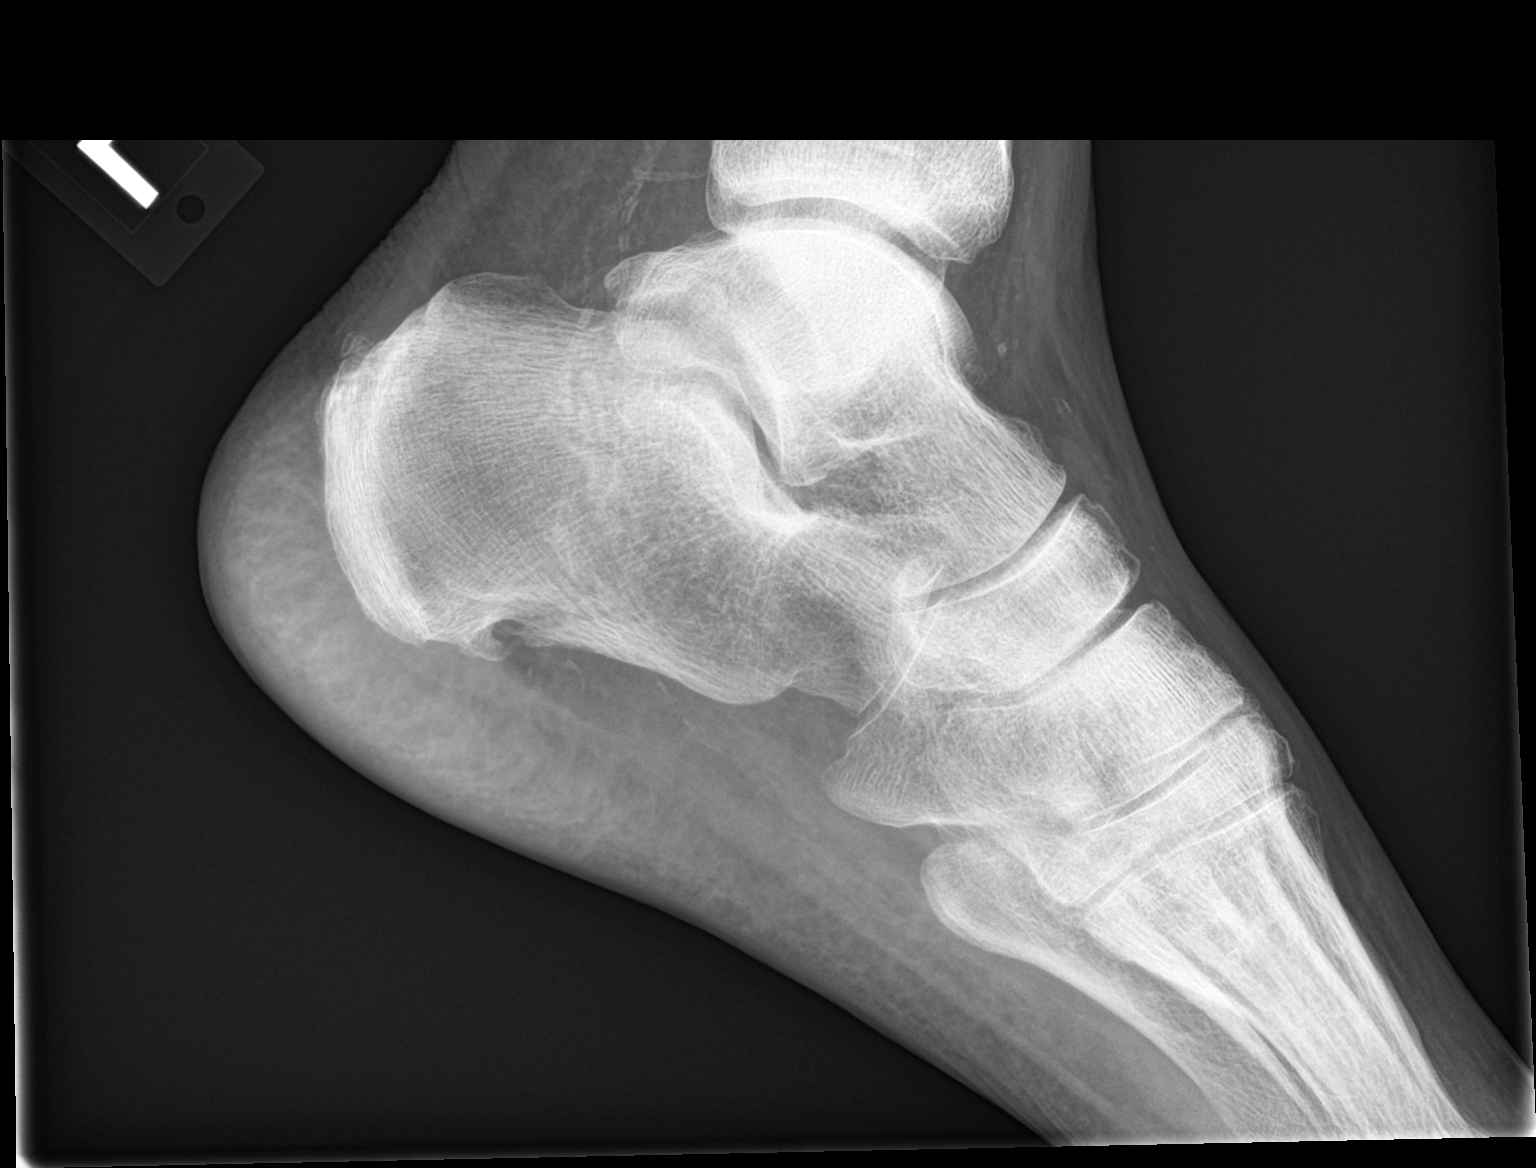

[2 of 2 positions shown; findings below may reference images not displayed]

FINDINGS: There appears to be a fracture extending through the central
calcaneus. The fracture line is vertically oriented. There is soft
tissue swelling adjacent to the calcaneus seen on the lateral view.
No other acute abnormalities.
IMPRESSION: The apparent calcaneal fracture is again visualized. It was actually
better visualized on the ankle films but is seen on this study as
well.

## 2020-03-27 IMAGING — DX DG ANKLE COMPLETE 3+V*L*
3 series · 3 of 3 positions shown · non-contrast
Comparison: None.

CLINICAL DATA: Heel pain.  Fall off step.

EXAM:
LEFT ANKLE COMPLETE - 3+ VIEW

[ankle ap]
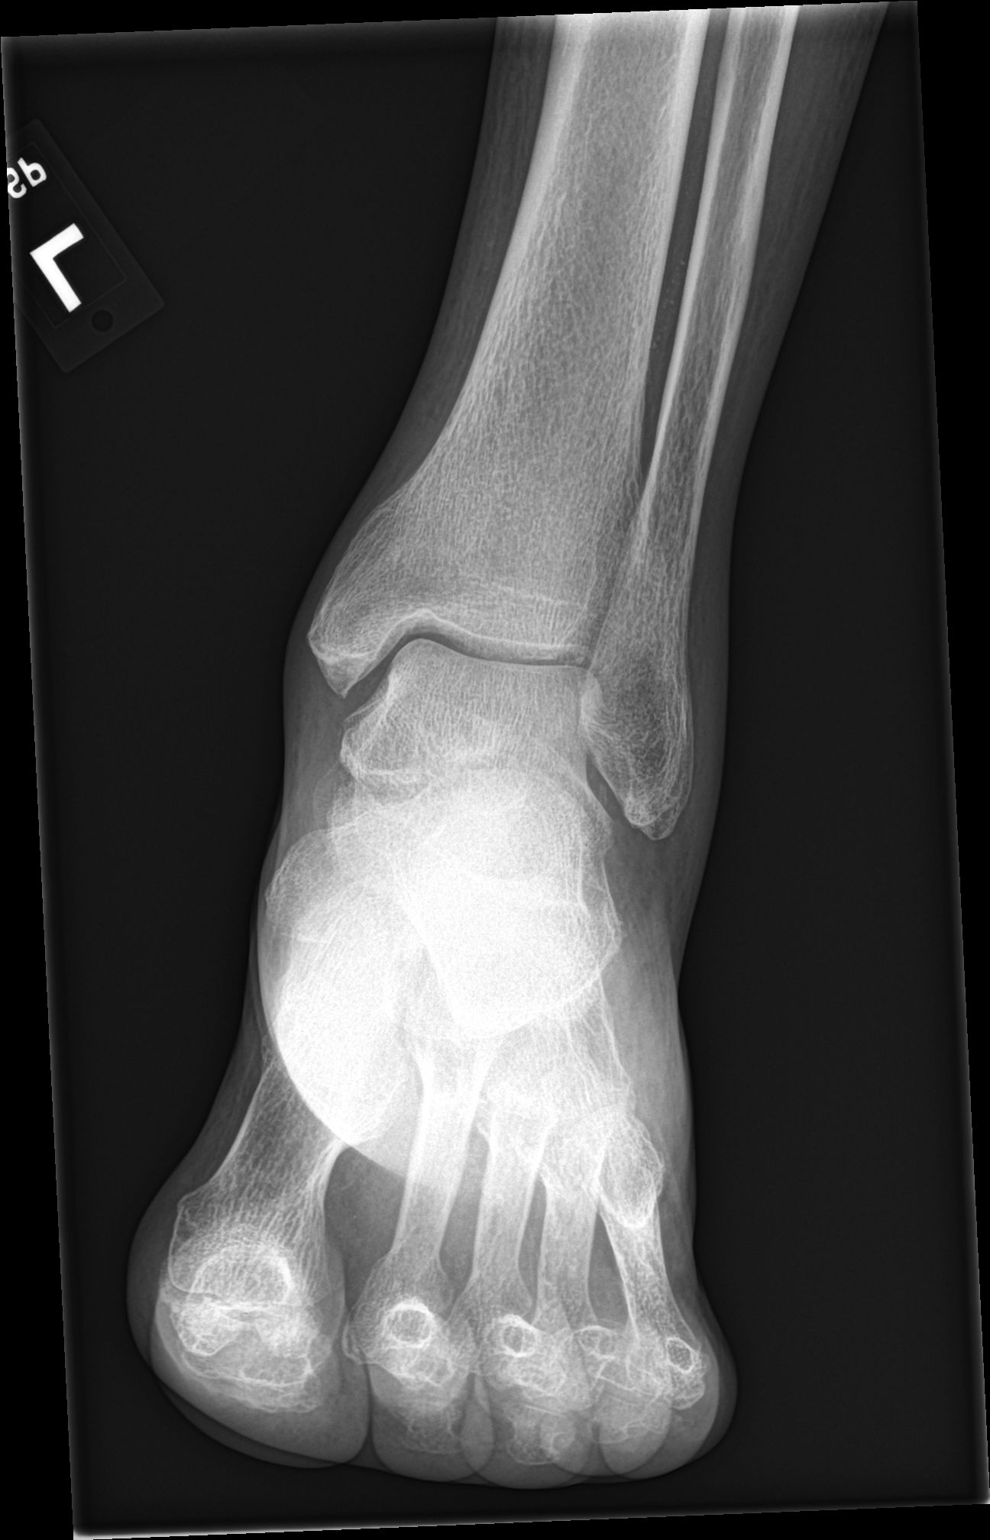

[ankle obl]
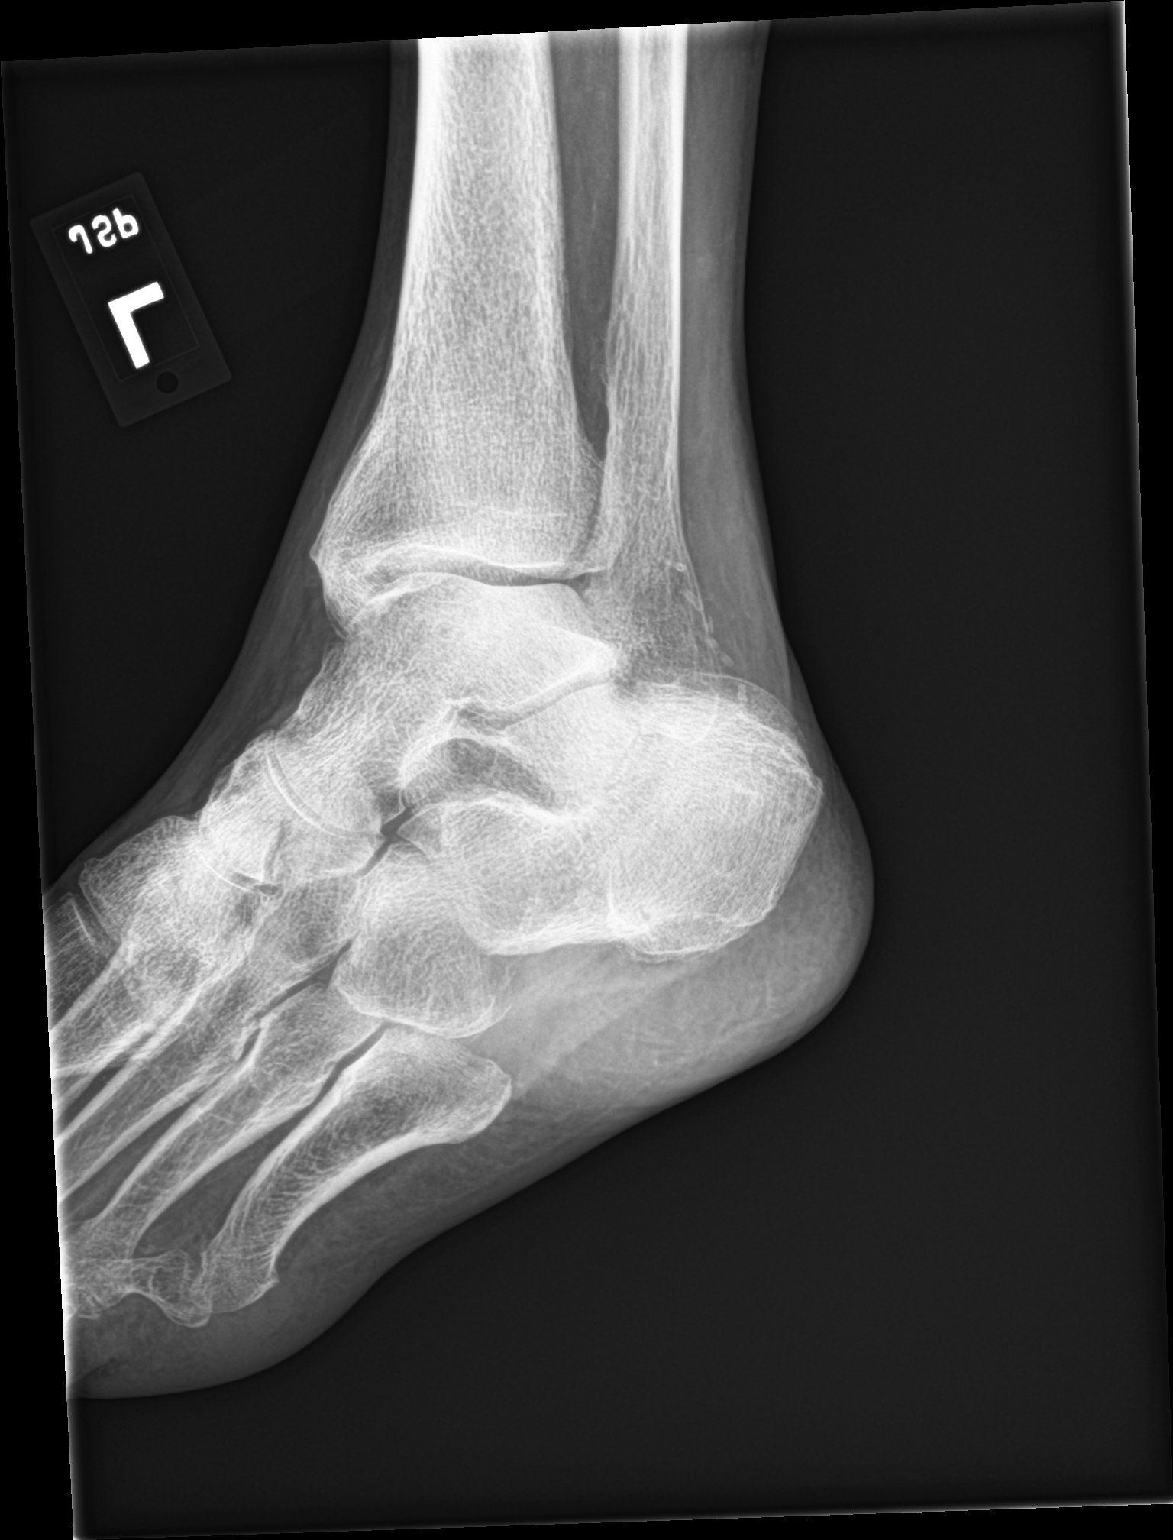

[ankle lat]
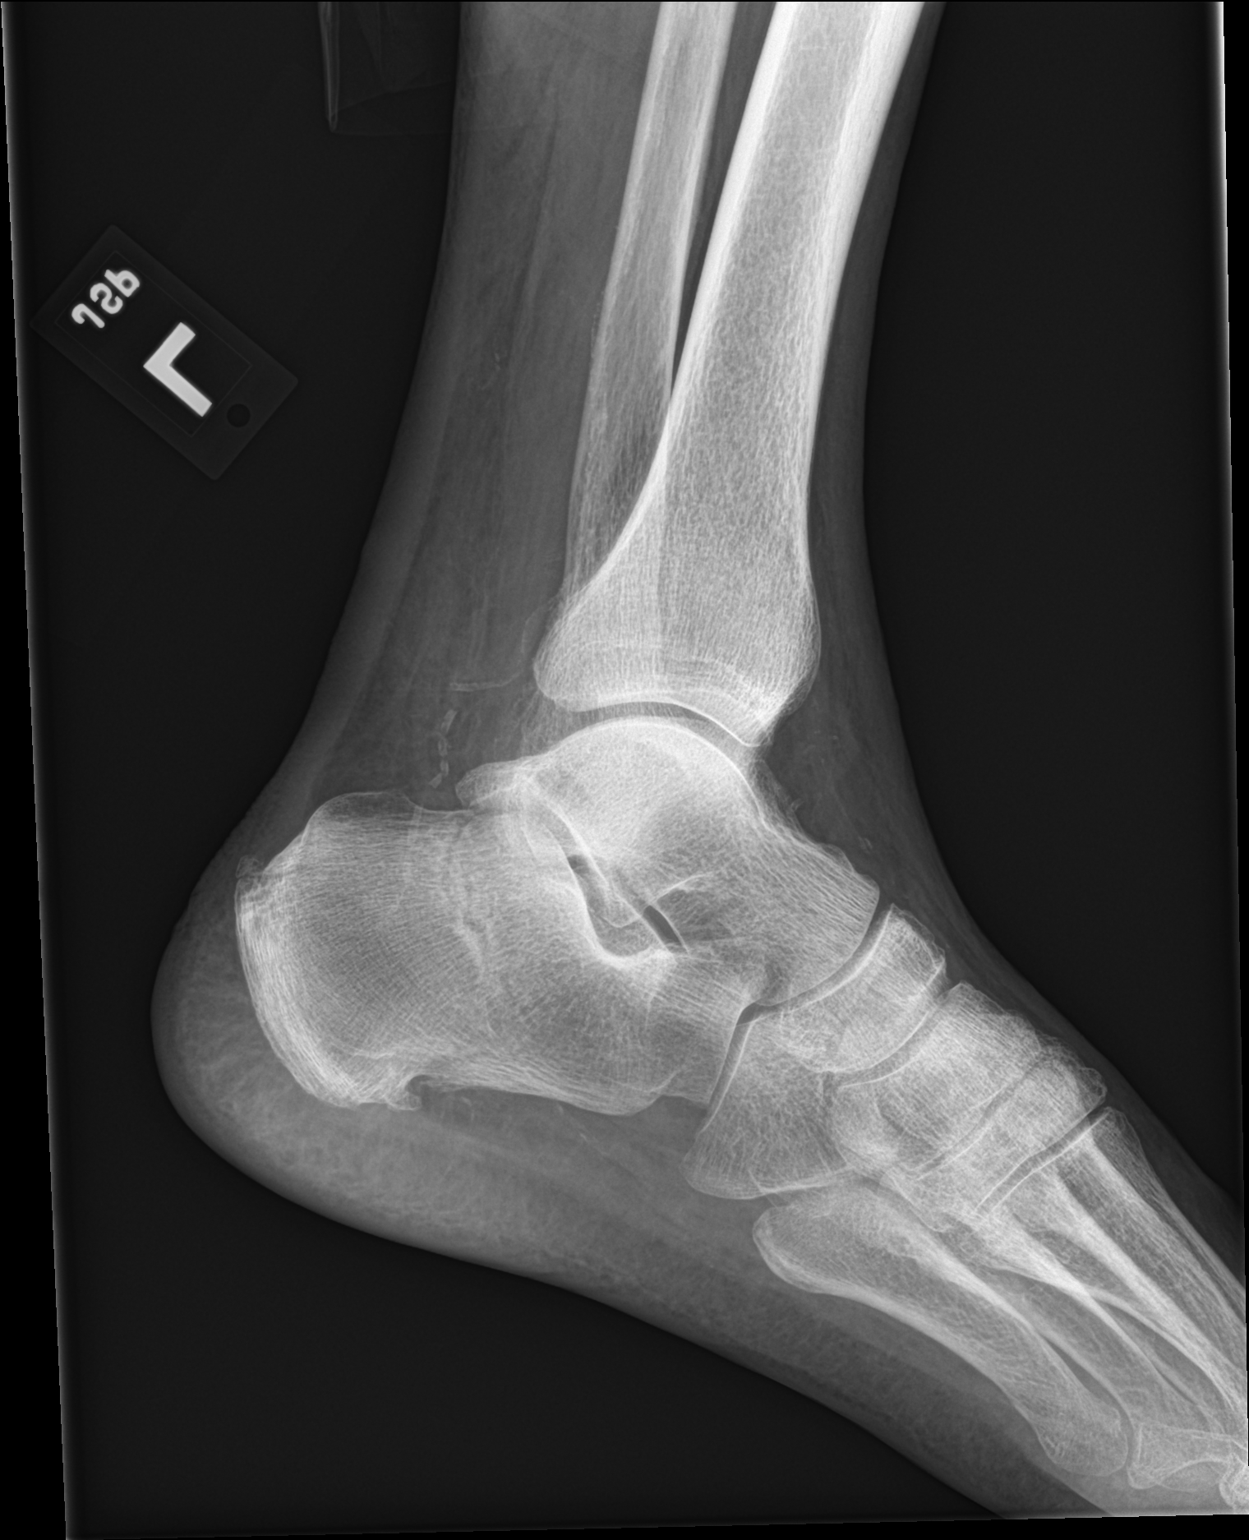

[3 of 3 positions shown; findings below may reference images not displayed]

FINDINGS: Vascular calcifications. No fracture in the distal tibia or fibula.
The ankle mortise is intact. There is a lucency extending through
the calcaneus in a vertical orientation with a step-off seen
superiorly. There is a more subtle lucency through the posterior
calcaneus. There appears to be soft tissue swelling adjacent to the
calcaneus. No other acute abnormalities identified.
IMPRESSION: There is a vertically oriented lucency through the calcaneus which
is consistent with a fracture. There is a more subtle lucency seen
posteriorly in the calcaneus which may represent a second more
subtle fracture line. There appears to be soft tissue swelling
adjacent to the calcaneus as well.

These results will be called to the ordering clinician or
representative by the Radiologist Assistant, and communication
documented in the PACS or zVision Dashboard.

## 2020-04-02 ENCOUNTER — Encounter: Payer: Self-pay | Admitting: Family Medicine

## 2020-04-02 ENCOUNTER — Other Ambulatory Visit: Payer: Self-pay

## 2020-04-02 ENCOUNTER — Ambulatory Visit (INDEPENDENT_AMBULATORY_CARE_PROVIDER_SITE_OTHER): Payer: Medicare HMO | Admitting: Family Medicine

## 2020-04-02 VITALS — BP 116/71 | HR 72 | Temp 97.1°F | Ht 74.0 in | Wt 163.4 lb

## 2020-04-02 DIAGNOSIS — E559 Vitamin D deficiency, unspecified: Secondary | ICD-10-CM

## 2020-04-02 DIAGNOSIS — E7800 Pure hypercholesterolemia, unspecified: Secondary | ICD-10-CM | POA: Diagnosis not present

## 2020-04-02 DIAGNOSIS — I480 Paroxysmal atrial fibrillation: Secondary | ICD-10-CM | POA: Diagnosis not present

## 2020-04-02 DIAGNOSIS — Z7901 Long term (current) use of anticoagulants: Secondary | ICD-10-CM

## 2020-04-02 LAB — COAGUCHEK XS/INR WAIVED
INR: 3.7 — ABNORMAL HIGH (ref 0.9–1.1)
Prothrombin Time: 44.7 s

## 2020-04-02 NOTE — Progress Notes (Signed)
Assessment & Plan:  1-2. Long term (current) use of anticoagulants/Paroxysmal atrial fibrillation [facility]) Description   INR today 3.7 (goal is 2-3).  Decrease from 7.5 every Tuesday, Thursday, and Saturday, with 5 mg Sunday, Monday, Wednesday, and Friday to 7.5 every Tuesday and Saturday with 5 mg on Sunday, Monday, Wednesday, Thursday, and Friday.   Re-check 2 weeks      - CoaguChek XS/INR Waived - CBC with Differential/Platelet; Future - CMP14+EGFR; Future  3. Vitamin D deficiency Labs to assess. - VITAMIN D 25 Hydroxy (Vit-D Deficiency, Fractures); Future  4. HYPERCHOLESTEROLEMIA  IIA Labs to assess. - CMP14+EGFR; Future - Lipid panel; Future   Follow up plan: Return in about 2 weeks (around 04/16/2020) for follow-up of chronic medication conditions.  [name], MSN, APRN, FNP-C [facility]  Subjective:   Patient ID: Danny Rames., male    DOB: 20-Feb-1931, 87 y.o.   MRN: [id number]  HPI: Briant Angelillo. is a 87 y.o. male presenting on 04/02/2020 for Coagulation Disorder  Anticoagulation: Patient here for anticoagulation monitoring. Indication: atrial fibrillation Bleeding Signs/Symptoms:  None Thromboembolic Signs/Symptoms:  None  Missed Coumadin Doses:  None Medication Changes:  Dosing last adjusted two weeks ago when two days per week were increased from 5 mg to 7.5 mg  Dietary Changes:  no Bacterial/Viral Infection:  no  Other Concerns:  no   ROS: Negative unless specifically indicated above in HPI.   Relevant past medical history reviewed and updated as indicated.   Allergies and medications reviewed and updated.   Current Outpatient Medications:  .  amiodarone (PACERONE) 200 MG tablet, Take 0.5 tablets (100 mg total) by mouth daily., Disp: 45 tablet, Rfl: 3 .  cholecalciferol (VITAMIN D) 25 MCG (1000 UNIT) tablet, Take 2,000 Units by mouth daily with breakfast., Disp: , Rfl:  .  diphenhydrAMINE (BENADRYL) 50 MG tablet,  Take 1 tablet (50 mg total) by mouth as directed. Take 1 Tablet just before leaving home on 02/10/20 for cath, Disp: 1 tablet, Rfl: 0 .  furosemide (LASIX) 20 MG tablet, Take 1 tablet (20 mg total) by mouth daily., Disp: 30 tablet, Rfl: 2 .  losartan (COZAAR) 50 MG tablet, TAKE 1 TABLET EVERY MORNING, Disp: 90 tablet, Rfl: 0 .  Multiple Vitamin (MULTIVITAMIN WITH MINERALS) TABS tablet, Take 1 tablet by mouth daily. One-A-Day for Men 50+, Disp: , Rfl:  .  Multiple Vitamins-Minerals (PRESERVISION AREDS 2 PO), Take 1 tablet by mouth in the morning and at bedtime., Disp: , Rfl:  .  nitroGLYCERIN (NITROSTAT) 0.4 MG SL tablet, PLACE 1 TABLET (0.4 MG TOTAL) UNDER THE TONGUE EVERY 5 (FIVE) MINUTES AS NEEDED FOR CHEST PAIN. (Patient taking differently: Place 0.4 mg under the tongue every 5 (five) minutes x 3 doses as needed.), Disp: 100 tablet, Rfl: 3 .  Omega-3 Fatty Acids (FISH OIL) 1200 MG CAPS, Take 2,400 mg by mouth daily., Disp: , Rfl:  .  omeprazole (PRILOSEC) 20 MG capsule, TAKE 1 CAPSULE TWICE DAILY (Patient taking differently: Take 20 mg by mouth in the morning and at bedtime. 30 minutes before breakfast & supper), Disp: 180 capsule, Rfl: 3 .  Probiotic Product (PHILLIPS COLON HEALTH PO), Take 1 capsule by mouth daily., Disp: , Rfl:  .  warfarin (COUMADIN) 5 MG tablet, Take 1 tablet (5 mg total) by mouth daily., Disp: 90 tablet, Rfl: 3 .  metoprolol tartrate (LOPRESSOR) 50 MG tablet, Take 1 tablet (50 mg total) by mouth 2 (two) times daily., Disp:  180 tablet, Rfl: 3  Allergies  Allergen Reactions  . Doxycycline Diarrhea  . Contrast Media [Iodinated Diagnostic Agents]     Syncope  . Lunesta [Eszopiclone]     "felt WILD"  . Morphine And Related Nausea And Vomiting  . Trazodone And Nefazodone Other (See Comments)    Extremely drowsy the next day after taking.    Objective:   BP 116/71   Pulse 72   Temp (!) 97.1 F (36.2 C) (Temporal)   Ht 6' 2"  (1.88 m)   Wt 163 lb 6.4 oz (74.1 kg)    SpO2 95%   BMI 20.98 kg/m    Physical Exam Vitals reviewed.  Constitutional:      General: He is not in acute distress.    Appearance: Normal appearance. He is normal weight. He is not ill-appearing, toxic-appearing or diaphoretic.  HENT:     Head: Normocephalic and atraumatic.  Eyes:     General: No scleral icterus.       Right eye: No discharge.        Left eye: No discharge.     Conjunctiva/sclera: Conjunctivae normal.  Cardiovascular:     Rate and Rhythm: Normal rate.  Pulmonary:     Effort: Pulmonary effort is normal. No respiratory distress.  Musculoskeletal:        General: Normal range of motion.     Cervical back: Normal range of motion.  Skin:    General: Skin is warm and dry.     Findings: Abrasion (right side of forehead from fall in the grass at home) present.  Neurological:     Mental Status: He is alert and oriented to person, place, and time. Mental status is at baseline.  Psychiatric:        Mood and Affect: Mood normal.        Behavior: Behavior normal.        Thought Content: Thought content normal.        Judgment: Judgment normal.

## 2020-04-05 ENCOUNTER — Encounter: Payer: Self-pay | Admitting: Family Medicine

## 2020-04-16 ENCOUNTER — Other Ambulatory Visit: Payer: Self-pay

## 2020-04-16 ENCOUNTER — Other Ambulatory Visit: Payer: Medicare HMO

## 2020-04-16 DIAGNOSIS — E7800 Pure hypercholesterolemia, unspecified: Secondary | ICD-10-CM

## 2020-04-16 DIAGNOSIS — I480 Paroxysmal atrial fibrillation: Secondary | ICD-10-CM

## 2020-04-16 DIAGNOSIS — Z7901 Long term (current) use of anticoagulants: Secondary | ICD-10-CM

## 2020-04-16 DIAGNOSIS — E559 Vitamin D deficiency, unspecified: Secondary | ICD-10-CM | POA: Diagnosis not present

## 2020-04-17 LAB — CBC WITH DIFFERENTIAL/PLATELET
Basophils Absolute: 0.1 10*3/uL (ref 0.0–0.2)
Basos: 1 %
EOS (ABSOLUTE): 0.2 10*3/uL (ref 0.0–0.4)
Eos: 3 %
Hematocrit: 40.8 % (ref 37.5–51.0)
Hemoglobin: 13.9 g/dL (ref 13.0–17.7)
Immature Grans (Abs): 0 10*3/uL (ref 0.0–0.1)
Immature Granulocytes: 1 %
Lymphocytes Absolute: 1.8 10*3/uL (ref 0.7–3.1)
Lymphs: 28 %
MCH: 31.9 pg (ref 26.6–33.0)
MCHC: 34.1 g/dL (ref 31.5–35.7)
MCV: 94 fL (ref 79–97)
Monocytes Absolute: 0.8 10*3/uL (ref 0.1–0.9)
Monocytes: 12 %
Neutrophils Absolute: 3.7 10*3/uL (ref 1.4–7.0)
Neutrophils: 55 %
Platelets: 254 10*3/uL (ref 150–450)
RBC: 4.36 x10E6/uL (ref 4.14–5.80)
RDW: 12.9 % (ref 11.6–15.4)
WBC: 6.6 10*3/uL (ref 3.4–10.8)

## 2020-04-17 LAB — CMP14+EGFR
ALT: 22 IU/L (ref 0–44)
AST: 28 IU/L (ref 0–40)
Albumin/Globulin Ratio: 2 (ref 1.2–2.2)
Albumin: 4.3 g/dL (ref 3.6–4.6)
Alkaline Phosphatase: 179 IU/L — ABNORMAL HIGH (ref 44–121)
BUN/Creatinine Ratio: 15 (ref 10–24)
BUN: 14 mg/dL (ref 8–27)
Bilirubin Total: 0.9 mg/dL (ref 0.0–1.2)
CO2: 23 mmol/L (ref 20–29)
Calcium: 9.5 mg/dL (ref 8.6–10.2)
Chloride: 93 mmol/L — ABNORMAL LOW (ref 96–106)
Creatinine, Ser: 0.96 mg/dL (ref 0.76–1.27)
Globulin, Total: 2.2 g/dL (ref 1.5–4.5)
Glucose: 97 mg/dL (ref 65–99)
Potassium: 4.4 mmol/L (ref 3.5–5.2)
Sodium: 130 mmol/L — ABNORMAL LOW (ref 134–144)
Total Protein: 6.5 g/dL (ref 6.0–8.5)
eGFR: 77 mL/min/{1.73_m2} (ref >59)

## 2020-04-17 LAB — VITAMIN D 25 HYDROXY (VIT D DEFICIENCY, FRACTURES): Vit D, 25-Hydroxy: 70 ng/mL (ref 30.0–100.0)

## 2020-04-17 LAB — LIPID PANEL
Chol/HDL Ratio: 2.7 ratio (ref 0.0–5.0)
Cholesterol, Total: 209 mg/dL — ABNORMAL HIGH (ref 100–199)
HDL: 77 mg/dL (ref >39)
LDL Chol Calc (NIH): 123 mg/dL — ABNORMAL HIGH (ref 0–99)
Triglycerides: 52 mg/dL (ref 0–149)
VLDL Cholesterol Cal: 9 mg/dL (ref 5–40)

## 2020-04-20 ENCOUNTER — Other Ambulatory Visit (HOSPITAL_COMMUNITY): Payer: Self-pay | Admitting: Physician Assistant

## 2020-04-20 DIAGNOSIS — I771 Stricture of artery: Secondary | ICD-10-CM

## 2020-04-30 ENCOUNTER — Ambulatory Visit (INDEPENDENT_AMBULATORY_CARE_PROVIDER_SITE_OTHER): Payer: Medicare HMO | Admitting: Family Medicine

## 2020-04-30 ENCOUNTER — Encounter: Payer: Self-pay | Admitting: Family Medicine

## 2020-04-30 ENCOUNTER — Other Ambulatory Visit: Payer: Self-pay

## 2020-04-30 VITALS — BP 130/78 | HR 76 | Temp 97.4°F | Ht 74.0 in | Wt 163.8 lb

## 2020-04-30 DIAGNOSIS — I700 Atherosclerosis of aorta: Secondary | ICD-10-CM

## 2020-04-30 DIAGNOSIS — I10 Essential (primary) hypertension: Secondary | ICD-10-CM

## 2020-04-30 DIAGNOSIS — I480 Paroxysmal atrial fibrillation: Secondary | ICD-10-CM | POA: Diagnosis not present

## 2020-04-30 DIAGNOSIS — K219 Gastro-esophageal reflux disease without esophagitis: Secondary | ICD-10-CM

## 2020-04-30 DIAGNOSIS — Z7901 Long term (current) use of anticoagulants: Secondary | ICD-10-CM | POA: Diagnosis not present

## 2020-04-30 DIAGNOSIS — E559 Vitamin D deficiency, unspecified: Secondary | ICD-10-CM

## 2020-04-30 DIAGNOSIS — R600 Localized edema: Secondary | ICD-10-CM | POA: Diagnosis not present

## 2020-04-30 DIAGNOSIS — E7800 Pure hypercholesterolemia, unspecified: Secondary | ICD-10-CM | POA: Diagnosis not present

## 2020-04-30 LAB — COAGUCHEK XS/INR WAIVED
INR: 1.7 — ABNORMAL HIGH (ref 0.9–1.1)
Prothrombin Time: 20.8 s

## 2020-04-30 MED ORDER — LOSARTAN POTASSIUM 50 MG PO TABS: 1.0000 | ORAL_TABLET | Freq: Every morning | ORAL | 1 refills | Status: DC

## 2020-04-30 MED ORDER — OMEPRAZOLE 20 MG PO CPDR: 20.0000 mg | DELAYED_RELEASE_CAPSULE | Freq: Two times a day (BID) | ORAL | 1 refills | Status: DC

## 2020-04-30 MED ORDER — FUROSEMIDE 20 MG PO TABS
20.0000 mg | ORAL_TABLET | Freq: Every day | ORAL | 1 refills | Status: DC
Start: 2020-04-30 — End: 2020-07-30

## 2020-04-30 NOTE — Progress Notes (Signed)
Assessment & Plan:  1-2. Paroxysmal atrial fibrillation (HCC)/Long term (current) use of anticoagulants Description   INR today 1.7 (goal is 2-3).  Take an extra 1/2 tablet today, then resume 7.5 every Tuesday and Saturday with 5 mg on Sunday, Monday, Wednesday, Thursday, and Friday.   Re-check 2 weeks      - CoaguChek XS/INR Waived  3. Essential hypertension Well controlled on current regimen.  - losartan (COZAAR) 50 MG tablet; Take 1 tablet (50 mg total) by mouth every morning.  Dispense: 90 tablet; Refill: 1  4. HYPERCHOLESTEROLEMIA  IIA Total cholesterol and LDL have increased. Patient is no longer taking a statin as it was discontinued after his heart catheterization in January. I will message cardiology regarding this.   5. Thoracic aorta atherosclerosis ([place]) Patient is no longer taking a statin as it was discontinued after his heart catheterization in January. I will message cardiology regarding this.   6. Edema of both lower extremities Well controlled on current regimen and compression hose. - furosemide (LASIX) 20 MG tablet; Take 1 tablet (20 mg total) by mouth daily.  Dispense: 90 tablet; Refill: 1  7. Gastroesophageal reflux disease, unspecified whether esophagitis present Well controlled on current regimen.  - omeprazole (PRILOSEC) 20 MG capsule; Take 1 capsule (20 mg total) by mouth in the morning and at bedtime. 30 minutes before breakfast & supper  Dispense: 180 capsule; Refill: 1  8. Vitamin D deficiency Well controlled on current regimen.    Return in about 3 weeks (around 05/21/2020) for INR.  Danny Limes, MSN, APRN, FNP-C [facility]  Subjective:    Patient ID: Danny [name]., male    DOB: 1931-03-28, 87 y.o.   MRN: [id number]  Patient Care Team: Loman Brooklyn, FNP as PCP - General (Family Medicine) Josue Hector, MD as PCP - Cardiology (Cardiology) Josue Hector, MD as Attending Physician (Cardiology) Danny Binder, MD (Inactive) as Consulting Physician (Gastroenterology) Okey Regal, OD (Optometry) Rexene Alberts, MD as Consulting Physician (Cardiothoracic Surgery) Ilean China, RN as Registered Nurse Allyn Kenner, MD as Consulting Physician (Dermatology)   Chief Complaint:  Chief Complaint  Patient presents with  . Coagulation Disorder    2 week follow up   . Hypertension  . Hyperlipidemia    Check up of chronic medical conditions     HPI: Danny [name]. is a 87 y.o. male presenting on 04/30/2020 for Coagulation Disorder (2 week follow up ), Hypertension, and Hyperlipidemia (Check up of chronic medical conditions )  Anticoagulation: Patient here for anticoagulation monitoring. Indication: atrial fibrillation Bleeding Signs/Symptoms:  None Thromboembolic Signs/Symptoms:  None  Missed Coumadin Doses:  None Medication Changes:  Dosage decreased from 7.5 every Tuesday, Thursday, and Saturday, with 5 mg Sunday, Monday, Wednesday, and Friday to 7.5 every Tuesday and Saturday with 5 mg on Sunday, Monday, Wednesday, Thursday, and Friday two weeks ago.  Dietary Changes:  Yes - patient has cut out sugar for lent. This ends next weekend and he will resume normal pattern of eating. Bacterial/Viral Infection:  No   New complaints: None  Social history:  Relevant past medical, surgical, family and social history reviewed and updated as indicated. Interim medical history since our last visit reviewed.  Allergies and medications reviewed and updated.  DATA REVIEWED: CHART IN EPIC  ROS: Negative unless specifically indicated above in HPI.    Current Outpatient Medications:  .  amiodarone (PACERONE) 200 MG tablet, Take 0.5 tablets (100  mg total) by mouth daily., Disp: 45 tablet, Rfl: 3 .  cholecalciferol (VITAMIN D) 25 MCG (1000 UNIT) tablet, Take 2,000 Units by mouth daily with breakfast., Disp: , Rfl:  .  furosemide (LASIX) 20 MG tablet, Take 1 tablet (20 mg total) by mouth  daily., Disp: 30 tablet, Rfl: 2 .  losartan (COZAAR) 50 MG tablet, TAKE 1 TABLET EVERY MORNING, Disp: 90 tablet, Rfl: 0 .  Multiple Vitamin (MULTIVITAMIN WITH MINERALS) TABS tablet, Take 1 tablet by mouth daily. One-A-Day for Men 50+, Disp: , Rfl:  .  Multiple Vitamins-Minerals (PRESERVISION AREDS 2 PO), Take 1 tablet by mouth in the morning and at bedtime., Disp: , Rfl:  .  nitroGLYCERIN (NITROSTAT) 0.4 MG SL tablet, PLACE 1 TABLET (0.4 MG TOTAL) UNDER THE TONGUE EVERY 5 (FIVE) MINUTES AS NEEDED FOR CHEST PAIN. (Patient taking differently: Place 0.4 mg under the tongue every 5 (five) minutes x 3 doses as needed.), Disp: 100 tablet, Rfl: 3 .  Omega-3 Fatty Acids (FISH OIL) 1200 MG CAPS, Take 2,400 mg by mouth daily., Disp: , Rfl:  .  omeprazole (PRILOSEC) 20 MG capsule, TAKE 1 CAPSULE TWICE DAILY (Patient taking differently: Take 20 mg by mouth in the morning and at bedtime. 30 minutes before breakfast & supper), Disp: 180 capsule, Rfl: 3 .  Probiotic Product (PHILLIPS COLON HEALTH PO), Take 1 capsule by mouth daily., Disp: , Rfl:  .  warfarin (COUMADIN) 5 MG tablet, Take 1 tablet (5 mg total) by mouth daily., Disp: 90 tablet, Rfl: 3 .  metoprolol tartrate (LOPRESSOR) 50 MG tablet, Take 1 tablet (50 mg total) by mouth 2 (two) times daily., Disp: 180 tablet, Rfl: 3   Allergies  Allergen Reactions  . Doxycycline Diarrhea  . Contrast Media [Iodinated Diagnostic Agents]     Syncope  . Lunesta [Eszopiclone]     "felt WILD"  . Morphine And Related Nausea And Vomiting  . Trazodone And Nefazodone Other (See Comments)    Extremely drowsy the next day after taking.   Past Medical History:  Diagnosis Date  . Anxiety   . Atrial fibrillation ([place])   . CAD, ARTERY BYPASS GRAFT 05/19/2008   Qualifier: Diagnosis of  By: Mare Ferrari, [place], Sherri    . Enteritis due to Clostridium difficile, history of 11/21/2012  . GERD (gastroesophageal reflux disease)   . Hyperlipidemia   . Hypertension   . Internal  carotid artery stenosis   . 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 Vitamin D deficiency     Past Surgical History:  Procedure Laterality Date  . CARDIOVERSION N/A 05/05/2016   Procedure: CARDIOVERSION;  Surgeon: Josue Hector, MD;  Location: [facility];  Service: Cardiovascular;  Laterality: N/A;  . CAROTID ENDARTERECTOMY Left 2003  . CAROTID-SUBCLAVIAN BYPASS GRAFT Right   . COLONOSCOPY  2005   hyperplastic polyp x 1 with no adenoma  . COLONOSCOPY N/A 04/11/2014   IRS:[facility] diverticulosis in the sigmoid colon/left colon is redundant  . CORONARY ARTERY BYPASS GRAFT    . ESOPHAGEAL DILATION N/A 04/11/2014   Procedure: ESOPHAGEAL DILATION;  Surgeon: Danny Binder, MD;  [facility];  Service: Endoscopy;  Laterality: N/A;  . ESOPHAGOGASTRODUODENOSCOPY N/A 04/11/2014   EVO:[id number] at the gastro junction/moderate erosive/duodenal web  . EYE SURGERY Bilateral    cataracts  . RIGHT/LEFT HEART CATH AND CORONARY/GRAFT ANGIOGRAPHY N/A 02/11/2020   Procedure: RIGHT/LEFT HEART CATH AND CORONARY/GRAFT ANGIOGRAPHY;  Surgeon: Martinique, Peter M, MD;  Location: [facility] [facility];  Service: Cardiovascular;  Laterality: N/A;  . SKIN CANCER EXCISION      Social History   Socioeconomic History  . Marital status: Married    Spouse name: ruby  . Number of children: 1  . Years of education: Not on file  . Highest education level: Not on file  Occupational History  . Occupation: retired    Fish farm manager: [employer]  Tobacco Use  . Smoking status: Former Smoker    Types: Cigarettes    Start date: 01/24/1950    Quit date: 01/24/1961    Years since quitting: 59.3  . Smokeless tobacco: Never Used  Vaping Use  . Vaping Use: Never used  Substance and Sexual Activity  . Alcohol use: Not Currently    Alcohol/week: 0.0 standard drinks    Comment: not drank since 12/2009  . Drug use: No  . Sexual activity: Yes    Partners: Female  Other Topics Concern  . Not on file  Social  History Narrative  . Not on file   Social Determinants of Health   Financial Resource Strain: Not on file  Food Insecurity: Not on file  Transportation Needs: Not on file  Physical Activity: Not on file  Stress: Not on file  Social Connections: Not on file  Intimate Partner Violence: Not on file        Objective:    BP 130/78   Pulse 76   Temp (!) 97.4 F (36.3 C) (Temporal)   Ht 6\' 2"  (1.88 m)   Wt 163 lb 12.8 oz (74.3 kg)   SpO2 100%   BMI 21.03 kg/m   Wt Readings from Last 3 Encounters:  04/30/20 163 lb 12.8 oz (74.3 kg)  04/02/20 163 lb 6.4 oz (74.1 kg)  03/20/20 163 lb 6.4 oz (74.1 kg)    Physical Exam Vitals reviewed.  Constitutional:      General: He is not in acute distress.    Appearance: Normal appearance. He is normal weight. He is not ill-appearing, toxic-appearing or diaphoretic.  HENT:     Head: Normocephalic and atraumatic.     Right Ear: Tympanic membrane, ear canal and external ear normal. There is no impacted cerumen.     Left Ear: Tympanic membrane, ear canal and external ear normal. There is no impacted cerumen.     Nose: Nose normal. No congestion or rhinorrhea.     Mouth/Throat:     Mouth: Mucous membranes are moist.     Pharynx: Oropharynx is clear. No oropharyngeal exudate or posterior oropharyngeal erythema.  Eyes:     General: No scleral icterus.       Right eye: No discharge.        Left eye: No discharge.     Conjunctiva/sclera: Conjunctivae normal.     Pupils: Pupils are equal, round, and reactive to light.  Neck:     Vascular: No carotid bruit.  Cardiovascular:     Rate and Rhythm: Normal rate and regular rhythm.     Heart sounds: Normal heart sounds. No murmur heard. No friction rub. No gallop.   Pulmonary:     Effort: Pulmonary effort is normal. No respiratory distress.     Breath sounds: Normal breath sounds. No stridor. No wheezing, rhonchi or rales.  Abdominal:     General: Abdomen is flat. Bowel sounds are normal. There  is no distension.     Palpations: Abdomen is soft. There is no hepatomegaly, splenomegaly or mass.     Tenderness: There is no  abdominal tenderness. There is no guarding or rebound.     Hernia: No hernia is present.  Musculoskeletal:        General: Normal range of motion.     Cervical back: Normal range of motion and neck supple. No rigidity. No muscular tenderness.     Right lower leg: No edema.     Left lower leg: No edema.  Lymphadenopathy:     Cervical: No cervical adenopathy.  Skin:    General: Skin is warm and dry.     Capillary Refill: Capillary refill takes less than 2 seconds.  Neurological:     General: No focal deficit present.     Mental Status: He is alert and oriented to person, place, and time. Mental status is at baseline.  Psychiatric:        Mood and Affect: Mood normal.        Behavior: Behavior normal.        Thought Content: Thought content normal.        Judgment: Judgment normal.     Lab Results  Component Value Date   TSH 0.969 10/14/2019   Lab Results  Component Value Date   WBC 6.6 04/16/2020   HGB 13.9 04/16/2020   HCT 40.8 04/16/2020   MCV 94 04/16/2020   PLT 254 04/16/2020   Lab Results  Component Value Date   NA 130 (L) 04/16/2020   K 4.4 04/16/2020   CO2 23 04/16/2020   GLUCOSE 97 04/16/2020   BUN 14 04/16/2020   CREATININE 0.96 04/16/2020   BILITOT 0.9 04/16/2020   ALKPHOS 179 (H) 04/16/2020   AST 28 04/16/2020   ALT 22 04/16/2020   PROT 6.5 04/16/2020   ALBUMIN 4.3 04/16/2020   CALCIUM 9.5 04/16/2020   ANIONGAP 9 02/06/2020   Lab Results  Component Value Date   CHOL 209 (H) 04/16/2020   Lab Results  Component Value Date   HDL 77 04/16/2020   Lab Results  Component Value Date   LDLCALC 123 (H) 04/16/2020   Lab Results  Component Value Date   TRIG 52 04/16/2020   Lab Results  Component Value Date   CHOLHDL 2.7 04/16/2020   No results found for: HGBA1C

## 2020-05-05 ENCOUNTER — Ambulatory Visit (HOSPITAL_COMMUNITY): Payer: Medicare HMO | Admitting: Physician Assistant

## 2020-05-05 ENCOUNTER — Encounter: Payer: Self-pay | Admitting: Family Medicine

## 2020-05-06 ENCOUNTER — Telehealth: Payer: Self-pay | Admitting: Family Medicine

## 2020-05-06 DIAGNOSIS — I700 Atherosclerosis of aorta: Secondary | ICD-10-CM

## 2020-05-06 DIAGNOSIS — E7800 Pure hypercholesterolemia, unspecified: Secondary | ICD-10-CM

## 2020-05-06 MED ORDER — LOVASTATIN 40 MG PO TABS: 40.0000 mg | ORAL_TABLET | Freq: Every day | ORAL | 3 refills | Status: DC

## 2020-05-06 NOTE — Telephone Encounter (Signed)
Please let patient know Dr. Johnsie Cancel confirmed he should still be on his lovastatin. I have refilled for him.

## 2020-05-06 NOTE — Telephone Encounter (Signed)
Patient notified and verbalized understanding. 

## 2020-05-21 ENCOUNTER — Other Ambulatory Visit: Payer: Self-pay

## 2020-05-21 ENCOUNTER — Ambulatory Visit (INDEPENDENT_AMBULATORY_CARE_PROVIDER_SITE_OTHER): Payer: Medicare HMO | Admitting: Family Medicine

## 2020-05-21 ENCOUNTER — Encounter: Payer: Self-pay | Admitting: Family Medicine

## 2020-05-21 VITALS — BP 107/63 | HR 75 | Temp 96.9°F | Ht 74.0 in | Wt 166.2 lb

## 2020-05-21 DIAGNOSIS — I480 Paroxysmal atrial fibrillation: Secondary | ICD-10-CM

## 2020-05-21 DIAGNOSIS — Z7901 Long term (current) use of anticoagulants: Secondary | ICD-10-CM | POA: Diagnosis not present

## 2020-05-21 LAB — COAGUCHEK XS/INR WAIVED
INR: 3.1 — ABNORMAL HIGH (ref 0.9–1.1)
Prothrombin Time: 37.6 s

## 2020-05-21 NOTE — Progress Notes (Signed)
Assessment & Plan:  1-2. Long term (current) use of anticoagulants/Paroxysmal atrial fibrillation [facility]) Description   INR today 3.1 (goal is 2-3).  Continue 7.5 every Tuesday and Saturday with 5 mg on Sunday, Monday, Wednesday, Thursday, and Friday.   Re-check 2 weeks      - CoaguChek XS/INR Waived   Return in about 1 week (around 05/28/2020) for INR.  [name], MSN, APRN, FNP-C [facility]  Subjective:    Patient ID: Danny [name]., male    DOB: December 28, 1931, 87 y.o.   MRN: [id number]  Patient Care Team: Loman Brooklyn, FNP as PCP - General (Family Medicine) Josue Hector, MD as PCP - Cardiology (Cardiology) Josue Hector, MD as Attending Physician (Cardiology) [name], MD (Inactive) as Consulting Physician (Gastroenterology) Okey Regal, OD (Optometry) Rexene Alberts, MD as Consulting Physician (Cardiothoracic Surgery) Ilean China, RN as Registered Nurse Allyn Kenner, MD as Consulting Physician (Dermatology)   Chief Complaint:  Chief Complaint  Patient presents with  . Coagulation Disorder    HPI: Danny [name]. is a 87 y.o. male presenting on 05/21/2020 for Coagulation Disorder  Anticoagulation: Patient here for anticoagulation monitoring. Indication: atrial fibrillation Bleeding Signs/Symptoms:  None Thromboembolic Signs/Symptoms:  None  Missed Coumadin Doses:  None Medication Changes: At our last visit 3 weeks ago patient took an extra half tablet and then resumed his current regimen Dietary Changes:  no Bacterial/Viral Infection:  no  New complaints: None  Social history:  Relevant past medical, surgical, family and social history reviewed and updated as indicated. Interim medical history since our last visit reviewed.  Allergies and medications reviewed and updated.  DATA REVIEWED: CHART IN EPIC  ROS: Negative unless specifically indicated above in HPI.    Current Outpatient Medications:  .   amiodarone (PACERONE) 200 MG tablet, Take 0.5 tablets (100 mg total) by mouth daily., Disp: 45 tablet, Rfl: 3 .  cholecalciferol (VITAMIN D) 25 MCG (1000 UNIT) tablet, Take 2,000 Units by mouth daily with breakfast., Disp: , Rfl:  .  furosemide (LASIX) 20 MG tablet, Take 1 tablet (20 mg total) by mouth daily., Disp: 90 tablet, Rfl: 1 .  losartan (COZAAR) 50 MG tablet, Take 1 tablet (50 mg total) by mouth every morning., Disp: 90 tablet, Rfl: 1 .  lovastatin (MEVACOR) 40 MG tablet, Take 1 tablet (40 mg total) by mouth at bedtime., Disp: 90 tablet, Rfl: 3 .  Multiple Vitamin (MULTIVITAMIN WITH MINERALS) TABS tablet, Take 1 tablet by mouth daily. One-A-Day for Men 50+, Disp: , Rfl:  .  Multiple Vitamins-Minerals (PRESERVISION AREDS 2 PO), Take 1 tablet by mouth in the morning and at bedtime., Disp: , Rfl:  .  nitroGLYCERIN (NITROSTAT) 0.4 MG SL tablet, PLACE 1 TABLET (0.4 MG TOTAL) UNDER THE TONGUE EVERY 5 (FIVE) MINUTES AS NEEDED FOR CHEST PAIN. (Patient taking differently: Place 0.4 mg under the tongue every 5 (five) minutes x 3 doses as needed.), Disp: 100 tablet, Rfl: 3 .  Omega-3 Fatty Acids (FISH OIL) 1200 MG CAPS, Take 2,400 mg by mouth daily., Disp: , Rfl:  .  omeprazole (PRILOSEC) 20 MG capsule, Take 1 capsule (20 mg total) by mouth in the morning and at bedtime. 30 minutes before breakfast & supper, Disp: 180 capsule, Rfl: 1 .  Probiotic Product (PHILLIPS COLON HEALTH PO), Take 1 capsule by mouth daily., Disp: , Rfl:  .  warfarin (COUMADIN) 5 MG tablet, Take 1 tablet (5 mg total) by mouth  daily., Disp: 90 tablet, Rfl: 3 .  metoprolol tartrate (LOPRESSOR) 50 MG tablet, Take 1 tablet (50 mg total) by mouth 2 (two) times daily., Disp: 180 tablet, Rfl: 3   Allergies  Allergen Reactions  . Doxycycline Diarrhea  . Contrast Media [Iodinated Diagnostic Agents]     Syncope  . Lunesta [Eszopiclone]     "felt WILD"  . Morphine And Related Nausea And Vomiting  . Trazodone And Nefazodone Other (See  Comments)    Extremely drowsy the next day after taking.   Past Medical History:  Diagnosis Date  . Anxiety   . Atrial fibrillation ([place])   . CAD, ARTERY BYPASS GRAFT 05/19/2008   Qualifier: Diagnosis of  By: Mare Ferrari, [place], Sherri    . Enteritis due to Clostridium difficile, history of 11/21/2012  . GERD (gastroesophageal reflux disease)   . Hyperlipidemia   . Hypertension   . Internal carotid artery stenosis   . 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 Vitamin D deficiency     Past Surgical History:  Procedure Laterality Date  . CARDIOVERSION N/A 05/05/2016   Procedure: CARDIOVERSION;  Surgeon: Josue Hector, MD;  Location: [facility];  Service: Cardiovascular;  Laterality: N/A;  . CAROTID ENDARTERECTOMY Left 2003  . CAROTID-SUBCLAVIAN BYPASS GRAFT Right   . COLONOSCOPY  2005   hyperplastic polyp x 1 with no adenoma  . COLONOSCOPY N/A 04/11/2014   CVU:DTHY diverticulosis in the sigmoid colon/left colon is redundant  . CORONARY ARTERY BYPASS GRAFT    . ESOPHAGEAL DILATION N/A 04/11/2014   Procedure: ESOPHAGEAL DILATION;  Surgeon: [name], MD;  [facility];  Service: Endoscopy;  Laterality: N/A;  . ESOPHAGOGASTRODUODENOSCOPY N/A 04/11/2014   [id number] at the gastro junction/moderate erosive/duodenal web  . EYE SURGERY Bilateral    cataracts  . RIGHT/LEFT HEART CATH AND CORONARY/GRAFT ANGIOGRAPHY N/A 02/11/2020   Procedure: RIGHT/LEFT HEART CATH AND CORONARY/GRAFT ANGIOGRAPHY;  Surgeon: Martinique, Peter M, MD;  Location: [place] [facility];  Service: Cardiovascular;  Laterality: N/A;  . SKIN CANCER EXCISION      Social History   Socioeconomic History  . Marital status: Married    Spouse name: ruby  . Number of children: 1  . Years of education: Not on file  . Highest education level: Not on file  Occupational History  . Occupation: retired    Fish farm manager: [employer]  Tobacco Use  . Smoking status: Former Smoker    Types: Cigarettes     Start date: 01/24/1950    Quit date: 01/24/1961    Years since quitting: 59.3  . Smokeless tobacco: Never Used  Vaping Use  . Vaping Use: Never used  Substance and Sexual Activity  . Alcohol use: Not Currently    Alcohol/week: 0.0 standard drinks    Comment: not drank since 12/2009  . Drug use: No  . Sexual activity: Yes    Partners: Female  Other Topics Concern  . Not on file  Social History Narrative  . Not on file   Social Determinants of Health   Financial Resource Strain: Not on file  Food Insecurity: Not on file  Transportation Needs: Not on file  Physical Activity: Not on file  Stress: Not on file  Social Connections: Not on file  Intimate Partner Violence: Not on file        Objective:    BP 107/63   Pulse 75   Temp (!) 96.9 F (36.1 C) (Temporal)   Ht 6'  2" (1.88 m)   Wt 166 lb 3.2 oz (75.4 kg)   SpO2 97%   BMI 21.34 kg/m   Wt Readings from Last 3 Encounters:  05/21/20 166 lb 3.2 oz (75.4 kg)  04/30/20 163 lb 12.8 oz (74.3 kg)  04/02/20 163 lb 6.4 oz (74.1 kg)    Physical Exam Vitals reviewed.  Constitutional:      General: He is not in acute distress.    Appearance: Normal appearance. He is normal weight. He is not ill-appearing, toxic-appearing or diaphoretic.  HENT:     Head: Normocephalic and atraumatic.  Eyes:     General: No scleral icterus.       Right eye: No discharge.        Left eye: No discharge.     Conjunctiva/sclera: Conjunctivae normal.  Cardiovascular:     Rate and Rhythm: Normal rate.  Pulmonary:     Effort: Pulmonary effort is normal. No respiratory distress.  Musculoskeletal:        General: Normal range of motion.     Cervical back: Normal range of motion.  Skin:    General: Skin is warm and dry.  Neurological:     Mental Status: He is alert and oriented to person, place, and time. Mental status is at baseline.  Psychiatric:        Mood and Affect: Mood normal.        Behavior: Behavior normal.        Thought Content:  Thought content normal.        Judgment: Judgment normal.     Lab Results  Component Value Date   TSH 0.969 10/14/2019   Lab Results  Component Value Date   WBC 6.6 04/16/2020   HGB 13.9 04/16/2020   HCT 40.8 04/16/2020   MCV 94 04/16/2020   PLT 254 04/16/2020   Lab Results  Component Value Date   NA 130 (L) 04/16/2020   K 4.4 04/16/2020   CO2 23 04/16/2020   GLUCOSE 97 04/16/2020   BUN 14 04/16/2020   CREATININE 0.96 04/16/2020   BILITOT 0.9 04/16/2020   ALKPHOS 179 (H) 04/16/2020   AST 28 04/16/2020   ALT 22 04/16/2020   PROT 6.5 04/16/2020   ALBUMIN 4.3 04/16/2020   CALCIUM 9.5 04/16/2020   ANIONGAP 9 02/06/2020   EGFR 77 04/16/2020   Lab Results  Component Value Date   CHOL 209 (H) 04/16/2020   Lab Results  Component Value Date   HDL 77 04/16/2020   Lab Results  Component Value Date   LDLCALC 123 (H) 04/16/2020   Lab Results  Component Value Date   TRIG 52 04/16/2020   Lab Results  Component Value Date   CHOLHDL 2.7 04/16/2020   No results found for: HGBA1C

## 2020-05-28 ENCOUNTER — Encounter: Payer: Self-pay | Admitting: Family Medicine

## 2020-05-28 ENCOUNTER — Other Ambulatory Visit: Payer: Self-pay

## 2020-05-28 ENCOUNTER — Ambulatory Visit (INDEPENDENT_AMBULATORY_CARE_PROVIDER_SITE_OTHER): Payer: Medicare HMO | Admitting: Family Medicine

## 2020-05-28 VITALS — BP 96/55 | HR 81 | Temp 97.2°F | Ht 74.0 in | Wt 168.4 lb

## 2020-05-28 DIAGNOSIS — Z7901 Long term (current) use of anticoagulants: Secondary | ICD-10-CM

## 2020-05-28 DIAGNOSIS — I480 Paroxysmal atrial fibrillation: Secondary | ICD-10-CM | POA: Diagnosis not present

## 2020-05-28 LAB — COAGUCHEK XS/INR WAIVED
INR: 2.9 — ABNORMAL HIGH (ref 0.9–1.1)
Prothrombin Time: 34.5 s

## 2020-05-28 NOTE — Progress Notes (Signed)
Assessment & Plan:  1-2. Long term (current) use of anticoagulants/Paroxysmal atrial fibrillation ([place]) - CoaguChek XS/INR Waived Description   INR today 2.9 (goal is 2-3).  Continue 7.5 every Tuesday and Saturday with 5 mg on Sunday, Monday, Wednesday, Thursday, and Friday.   Re-check 6 weeks        Return in about 6 weeks (around 07/09/2020) for INR.  [name], MSN, APRN, FNP-C [facility]  Subjective:    Patient ID: Danny [name]., male    DOB: 01/15/1932, 87 y.o.   MRN: [id number]  Patient Care Team: Loman Brooklyn, FNP as PCP - General (Family Medicine) Josue Hector, MD as PCP - Cardiology (Cardiology) Josue Hector, MD as Attending Physician (Cardiology) [name], MD (Inactive) as Consulting Physician (Gastroenterology) Okey Regal, OD (Optometry) Rexene Alberts, MD as Consulting Physician (Cardiothoracic Surgery) Ilean China, RN as Registered Nurse Allyn Kenner, MD as Consulting Physician (Dermatology)   Chief Complaint:  Chief Complaint  Patient presents with  . Coagulation Disorder    HPI: Danny [name]. is a 87 y.o. male presenting on 05/28/2020 for Coagulation Disorder  Anticoagulation: Patient here for anticoagulation monitoring. Indication: atrial fibrillation Bleeding Signs/Symptoms:  None Thromboembolic Signs/Symptoms:  None  Missed Coumadin Doses:  None Medication Changes: None Dietary Changes:  no Bacterial/Viral Infection:  no  New complaints: None  Social history:  Relevant past medical, surgical, family and social history reviewed and updated as indicated. Interim medical history since our last visit reviewed.  Allergies and medications reviewed and updated.  DATA REVIEWED: CHART IN EPIC  ROS: Negative unless specifically indicated above in HPI.    Current Outpatient Medications:  .  amiodarone (PACERONE) 200 MG tablet, Take 0.5 tablets (100 mg total) by mouth daily., Disp: 45  tablet, Rfl: 3 .  cholecalciferol (VITAMIN D) 25 MCG (1000 UNIT) tablet, Take 2,000 Units by mouth daily with breakfast., Disp: , Rfl:  .  furosemide (LASIX) 20 MG tablet, Take 1 tablet (20 mg total) by mouth daily., Disp: 90 tablet, Rfl: 1 .  losartan (COZAAR) 50 MG tablet, Take 1 tablet (50 mg total) by mouth every morning., Disp: 90 tablet, Rfl: 1 .  lovastatin (MEVACOR) 40 MG tablet, Take 1 tablet (40 mg total) by mouth at bedtime., Disp: 90 tablet, Rfl: 3 .  Multiple Vitamin (MULTIVITAMIN WITH MINERALS) TABS tablet, Take 1 tablet by mouth daily. One-A-Day for Men 50+, Disp: , Rfl:  .  Multiple Vitamins-Minerals (PRESERVISION AREDS 2 PO), Take 1 tablet by mouth in the morning and at bedtime., Disp: , Rfl:  .  nitroGLYCERIN (NITROSTAT) 0.4 MG SL tablet, PLACE 1 TABLET (0.4 MG TOTAL) UNDER THE TONGUE EVERY 5 (FIVE) MINUTES AS NEEDED FOR CHEST PAIN. (Patient taking differently: Place 0.4 mg under the tongue every 5 (five) minutes x 3 doses as needed.), Disp: 100 tablet, Rfl: 3 .  Omega-3 Fatty Acids (FISH OIL) 1200 MG CAPS, Take 2,400 mg by mouth daily., Disp: , Rfl:  .  omeprazole (PRILOSEC) 20 MG capsule, Take 1 capsule (20 mg total) by mouth in the morning and at bedtime. 30 minutes before breakfast & supper, Disp: 180 capsule, Rfl: 1 .  Probiotic Product (PHILLIPS COLON HEALTH PO), Take 1 capsule by mouth daily., Disp: , Rfl:  .  warfarin (COUMADIN) 5 MG tablet, Take 1 tablet (5 mg total) by mouth daily., Disp: 90 tablet, Rfl: 3 .  metoprolol tartrate (LOPRESSOR) 50 MG tablet, Take 1 tablet (50  mg total) by mouth 2 (two) times daily., Disp: 180 tablet, Rfl: 3   Allergies  Allergen Reactions  . Doxycycline Diarrhea  . Contrast Media [Iodinated Diagnostic Agents]     Syncope  . Lunesta [Eszopiclone]     "felt WILD"  . Morphine And Related Nausea And Vomiting  . Trazodone And Nefazodone Other (See Comments)    Extremely drowsy the next day after taking.   Past Medical History:  Diagnosis  Date  . Anxiety   . Atrial fibrillation ([place])   . CAD, ARTERY BYPASS GRAFT 05/19/2008   Qualifier: Diagnosis of  By: Mare Ferrari, [place], Sherri    . Enteritis due to Clostridium difficile, history of 11/21/2012  . GERD (gastroesophageal reflux disease)   . Hyperlipidemia   . Hypertension   . Internal carotid artery stenosis   . 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 Vitamin D deficiency     Past Surgical History:  Procedure Laterality Date  . CARDIOVERSION N/A 05/05/2016   Procedure: CARDIOVERSION;  Surgeon: Josue Hector, MD;  Location: [facility];  Service: Cardiovascular;  Laterality: N/A;  . CAROTID ENDARTERECTOMY Left 2003  . CAROTID-SUBCLAVIAN BYPASS GRAFT Right   . COLONOSCOPY  2005   hyperplastic polyp x 1 with no adenoma  . COLONOSCOPY N/A 04/11/2014   KDT:[id number] diverticulosis in the sigmoid colon/left colon is redundant  . CORONARY ARTERY BYPASS GRAFT    . ESOPHAGEAL DILATION N/A 04/11/2014   Procedure: ESOPHAGEAL DILATION;  Surgeon: [name], MD;  [facility];  Service: Endoscopy;  Laterality: N/A;  . ESOPHAGOGASTRODUODENOSCOPY N/A 04/11/2014   [id number] at the gastro junction/moderate erosive/duodenal web  . EYE SURGERY Bilateral    cataracts  . RIGHT/LEFT HEART CATH AND CORONARY/GRAFT ANGIOGRAPHY N/A 02/11/2020   Procedure: RIGHT/LEFT HEART CATH AND CORONARY/GRAFT ANGIOGRAPHY;  Surgeon: Martinique, Peter M, MD;  Location: [place] [facility];  Service: Cardiovascular;  Laterality: N/A;  . SKIN CANCER EXCISION      Social History   Socioeconomic History  . Marital status: Married    Spouse name: Danny [name]  . Number of children: 1  . Years of education: Not on file  . Highest education level: Not on file  Occupational History  . Occupation: retired    Fish farm manager: [employer]  Tobacco Use  . Smoking status: Former Smoker    Types: Cigarettes    Start date: 01/24/1950    Quit date: 01/24/1961    Years since quitting: 59.3  . Smokeless  tobacco: Never Used  Vaping Use  . Vaping Use: Never used  Substance and Sexual Activity  . Alcohol use: Not Currently    Alcohol/week: 0.0 standard drinks    Comment: not drank since 12/2009  . Drug use: No  . Sexual activity: Yes    Partners: Female  Other Topics Concern  . Not on file  Social History Narrative  . Not on file   Social Determinants of Health   Financial Resource Strain: Not on file  Food Insecurity: Not on file  Transportation Needs: Not on file  Physical Activity: Not on file  Stress: Not on file  Social Connections: Not on file  Intimate Partner Violence: Not on file        Objective:    BP (!) 96/55   Pulse 81   Temp (!) 97.2 F (36.2 C) (Temporal)   Ht 6' 2"  (1.88 m)   Wt 168 lb 6.4 oz (76.4 kg)   SpO2 98%  BMI 21.62 kg/m   Wt Readings from Last 3 Encounters:  05/28/20 168 lb 6.4 oz (76.4 kg)  05/21/20 166 lb 3.2 oz (75.4 kg)  04/30/20 163 lb 12.8 oz (74.3 kg)    Physical Exam Vitals reviewed.  Constitutional:      General: He is not in acute distress.    Appearance: Normal appearance. He is normal weight. He is not ill-appearing, toxic-appearing or diaphoretic.  HENT:     Head: Normocephalic and atraumatic.  Eyes:     General: No scleral icterus.       Right eye: No discharge.        Left eye: No discharge.     Conjunctiva/sclera: Conjunctivae normal.  Cardiovascular:     Rate and Rhythm: Normal rate.  Pulmonary:     Effort: Pulmonary effort is normal. No respiratory distress.  Musculoskeletal:        General: Normal range of motion.     Cervical back: Normal range of motion.  Skin:    General: Skin is warm and dry.  Neurological:     Mental Status: He is alert and oriented to person, place, and time. Mental status is at baseline.  Psychiatric:        Mood and Affect: Mood normal.        Behavior: Behavior normal.        Thought Content: Thought content normal.        Judgment: Judgment normal.     Lab Results   Component Value Date   TSH 0.969 10/14/2019   Lab Results  Component Value Date   WBC 6.6 04/16/2020   HGB 13.9 04/16/2020   HCT 40.8 04/16/2020   MCV 94 04/16/2020   PLT 254 04/16/2020   Lab Results  Component Value Date   NA 130 (L) 04/16/2020   K 4.4 04/16/2020   CO2 23 04/16/2020   GLUCOSE 97 04/16/2020   BUN 14 04/16/2020   CREATININE 0.96 04/16/2020   BILITOT 0.9 04/16/2020   ALKPHOS 179 (H) 04/16/2020   AST 28 04/16/2020   ALT 22 04/16/2020   PROT 6.5 04/16/2020   ALBUMIN 4.3 04/16/2020   CALCIUM 9.5 04/16/2020   ANIONGAP 9 02/06/2020   EGFR 77 04/16/2020   Lab Results  Component Value Date   CHOL 209 (H) 04/16/2020   Lab Results  Component Value Date   HDL 77 04/16/2020   Lab Results  Component Value Date   LDLCALC 123 (H) 04/16/2020   Lab Results  Component Value Date   TRIG 52 04/16/2020   Lab Results  Component Value Date   CHOLHDL 2.7 04/16/2020   No results found for: HGBA1C

## 2020-06-24 DIAGNOSIS — B029 Zoster without complications: Secondary | ICD-10-CM

## 2020-06-24 HISTORY — DX: Zoster without complications: B02.9

## 2020-06-25 ENCOUNTER — Encounter: Payer: Self-pay | Admitting: Family

## 2020-06-25 ENCOUNTER — Ambulatory Visit (INDEPENDENT_AMBULATORY_CARE_PROVIDER_SITE_OTHER): Payer: Medicare HMO | Admitting: Family

## 2020-06-25 DIAGNOSIS — B029 Zoster without complications: Secondary | ICD-10-CM

## 2020-06-25 MED ORDER — VALACYCLOVIR HCL 1 G PO TABS: 1000.0000 mg | ORAL_TABLET | Freq: Three times a day (TID) | ORAL | 0 refills | Status: AC

## 2020-06-25 NOTE — Progress Notes (Signed)
   Virtual Visit  Note Due to COVID-19 pandemic this visit was conducted virtually. This visit type was conducted due to national recommendations for restrictions regarding the COVID-19 Pandemic (e.g. social distancing, sheltering in place) in an effort to limit this patient's exposure and mitigate transmission in our community. All issues noted in this document were discussed and addressed.  A physical exam was not performed with this format.  I connected with Danny Loron. on 06/25/20 at 10:53 AM  by telephone and verified that I am speaking with the correct person using two identifiers. Danny Loron. is currently located at home and no one is currently with him during visit. The provider, Evelina Dun, FNP is located in their office at time of visit.  I discussed the limitations, risks, security and privacy concerns of performing an evaluation and management service by telephone and the availability of in person appointments. I also discussed with the patient that there may be a patient responsible charge related to this service. The patient expressed understanding and agreed to proceed.   History and Present Illness:  Pt calls with complaints of a rash on right hip that extends to his back. He reports the area is tingling and aching pain of 10 out 10 when touching the area. States it is a 0 when sitting still.  Rash This is a new problem. The current episode started yesterday. The problem has been gradually worsening since onset. The rash is characterized by redness and pain. He was exposed to nothing.      Review of Systems  Skin: Positive for rash.     Observations/Objective: No SOB or distress noted   Assessment and Plan: 1. Herpes zoster without complication Do not scratch Keep clean and dry Encouraged shingles vaccine after improved RTO if symptoms worsen or do not improve  - valACYclovir (VALTREX) 1000 MG tablet; Take 1 tablet (1,000 mg total) by mouth 3 (three)  times daily for 7 days.  Dispense: 21 tablet; Refill: 0      I discussed the assessment and treatment plan with the patient. The patient was provided an opportunity to ask questions and all were answered. The patient agreed with the plan and demonstrated an understanding of the instructions.   The patient was advised to call back or seek an in-person evaluation if the symptoms worsen or if the condition fails to improve as anticipated.  The above assessment and management plan was discussed with the patient. The patient verbalized understanding of and has agreed to the management plan. Patient is aware to call the clinic if symptoms persist or worsen. Patient is aware when to return to the clinic for a follow-up visit. Patient educated on when it is appropriate to go to the emergency department.   Time call ended: 11:04 AM    I provided 11 minutes of  non face-to-face time during this encounter.    Evelina Dun, FNP

## 2020-07-10 ENCOUNTER — Ambulatory Visit (INDEPENDENT_AMBULATORY_CARE_PROVIDER_SITE_OTHER): Payer: Medicare HMO | Admitting: Family Medicine

## 2020-07-10 ENCOUNTER — Other Ambulatory Visit: Payer: Self-pay

## 2020-07-10 ENCOUNTER — Encounter: Payer: Self-pay | Admitting: Family Medicine

## 2020-07-10 VITALS — BP 129/73 | HR 84 | Temp 98.1°F | Ht 74.0 in | Wt 168.2 lb

## 2020-07-10 DIAGNOSIS — I480 Paroxysmal atrial fibrillation: Secondary | ICD-10-CM | POA: Diagnosis not present

## 2020-07-10 DIAGNOSIS — Z7901 Long term (current) use of anticoagulants: Secondary | ICD-10-CM

## 2020-07-10 DIAGNOSIS — B029 Zoster without complications: Secondary | ICD-10-CM | POA: Diagnosis not present

## 2020-07-10 LAB — COAGUCHEK XS/INR WAIVED
INR: 1.1 (ref 0.9–1.1)
Prothrombin Time: 13 s

## 2020-07-10 MED ORDER — LIDOCAINE HCL URETHRAL/MUCOSAL 2 % EX PRSY: 1.0000 "application " | PREFILLED_SYRINGE | CUTANEOUS | 2 refills | Status: DC | PRN

## 2020-07-10 MED ORDER — GABAPENTIN 300 MG PO CAPS: 300.0000 mg | ORAL_CAPSULE | Freq: Two times a day (BID) | ORAL | 0 refills | Status: DC | PRN

## 2020-07-10 MED ORDER — LEVOCETIRIZINE DIHYDROCHLORIDE 5 MG PO TABS: 5.0000 mg | ORAL_TABLET | Freq: Every evening | ORAL | 0 refills | Status: DC

## 2020-07-10 NOTE — Progress Notes (Signed)
Assessment & Plan:  1-2. Paroxysmal atrial fibrillation (HCC)/Long term (current) use of anticoagulants Description   INR today 1.1 (goal is 2-3).  Double dose today for a total of 10 mg, then continue 7.5 every Tuesday and Saturday with 5 mg on Sunday, Monday, Wednesday, Thursday, and Friday.   Re-check 1 weeks      - CoaguChek XS/INR Waived  3. Herpes zoster without complication Education provided on shingles. - gabapentin (NEURONTIN) 300 MG capsule; Take 1 capsule (300 mg total) by mouth 2 (two) times daily as needed.  Dispense: 60 capsule; Refill: 0 - lidocaine (XYLOCAINE) 2 % jelly; Apply 1 application topically as needed.  Dispense: 20 mL; Refill: 2 - levocetirizine (XYZAL) 5 MG tablet; Take 1 tablet (5 mg total) by mouth every evening.  Dispense: 30 tablet; Refill: 0   Return in 6 days (on 07/16/2020) for INR.  [name], MSN, APRN, FNP-C [facility]  Subjective:    Patient ID: [name]., male    DOB: [birth year], 87 y.o.   MRN: [id number]  Patient Care Team: Loman Brooklyn, FNP as PCP - General (Family Medicine) Josue Hector, MD as PCP - Cardiology (Cardiology) Josue Hector, MD as Attending Physician (Cardiology) Danny Binder, MD (Inactive) as Consulting Physician (Gastroenterology) Okey Regal, OD (Optometry) Rexene Alberts, MD as Consulting Physician (Cardiothoracic Surgery) Ilean China, RN as Registered Nurse Allyn Kenner, MD as Consulting Physician (Dermatology)   Chief Complaint:  Chief Complaint  Patient presents with   Coagulation Disorder   Herpes Zoster    Patient was seen 6/2 and states it is better but still there and itches     HPI: Danny [name]. is a 87 y.o. male presenting on 07/10/2020 for Coagulation Disorder and Herpes Zoster (Patient was seen 6/2 and states it is better but still there and itches )  Anticoagulation: Patient here for anticoagulation monitoring. Indication: atrial  fibrillation Bleeding Signs/Symptoms:  None Thromboembolic Signs/Symptoms:  None  Missed Coumadin Doses:  Yes - patient has shingles and has been taking BC powder for the pain. Every day he takes a BC powder he skips his coumadin. Medication Changes: None Dietary Changes:  no Bacterial/Viral Infection:  no  New complaints: His shingles is very painful and itchy.   Social history:  Relevant past medical, surgical, family and social history reviewed and updated as indicated. Interim medical history since our last visit reviewed.  Allergies and medications reviewed and updated.  DATA REVIEWED: CHART IN EPIC  ROS: Negative unless specifically indicated above in HPI.    Current Outpatient Medications:    amiodarone (PACERONE) 200 MG tablet, Take 0.5 tablets (100 mg total) by mouth daily., Disp: 45 tablet, Rfl: 3   cholecalciferol (VITAMIN D) 25 MCG (1000 UNIT) tablet, Take 2,000 Units by mouth daily with breakfast., Disp: , Rfl:    furosemide (LASIX) 20 MG tablet, Take 1 tablet (20 mg total) by mouth daily., Disp: 90 tablet, Rfl: 1   losartan (COZAAR) 50 MG tablet, Take 1 tablet (50 mg total) by mouth every morning., Disp: 90 tablet, Rfl: 1   lovastatin (MEVACOR) 40 MG tablet, Take 1 tablet (40 mg total) by mouth at bedtime., Disp: 90 tablet, Rfl: 3   Multiple Vitamin (MULTIVITAMIN WITH MINERALS) TABS tablet, Take 1 tablet by mouth daily. One-A-Day for Men 50+, Disp: , Rfl:    Multiple Vitamins-Minerals (PRESERVISION AREDS 2 PO), Take 1 tablet by mouth in the morning and at bedtime.,  Disp: , Rfl:    nitroGLYCERIN (NITROSTAT) 0.4 MG SL tablet, PLACE 1 TABLET (0.4 MG TOTAL) UNDER THE TONGUE EVERY 5 (FIVE) MINUTES AS NEEDED FOR CHEST PAIN. (Patient taking differently: Place 0.4 mg under the tongue every 5 (five) minutes x 3 doses as needed.), Disp: 100 tablet, Rfl: 3   Omega-3 Fatty Acids (FISH OIL) 1200 MG CAPS, Take 2,400 mg by mouth daily., Disp: , Rfl:    omeprazole (PRILOSEC) 20 MG  capsule, Take 1 capsule (20 mg total) by mouth in the morning and at bedtime. 30 minutes before breakfast & supper, Disp: 180 capsule, Rfl: 1   Probiotic Product (Gonzales), Take 1 capsule by mouth daily., Disp: , Rfl:    warfarin (COUMADIN) 5 MG tablet, Take 1 tablet (5 mg total) by mouth daily., Disp: 90 tablet, Rfl: 3   metoprolol tartrate (LOPRESSOR) 50 MG tablet, Take 1 tablet (50 mg total) by mouth 2 (two) times daily., Disp: 180 tablet, Rfl: 3   Allergies  Allergen Reactions   Doxycycline Diarrhea   Contrast Media [Iodinated Diagnostic Agents]     Syncope   Lunesta [Eszopiclone]     "felt WILD"   Morphine And Related Nausea And Vomiting   Trazodone And Nefazodone Other (See Comments)    Extremely drowsy the next day after taking.   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 01/14/2014   Vitamin D deficiency     Past Surgical History:  Procedure Laterality Date   CARDIOVERSION N/A 05/05/2016   Procedure: CARDIOVERSION;  Surgeon: Josue Hector, MD;  Location: [place];  Service: Cardiovascular;  Laterality: N/A;   CAROTID ENDARTERECTOMY Left 2003   CAROTID-SUBCLAVIAN BYPASS GRAFT Right    COLONOSCOPY  2005   hyperplastic polyp x 1 with no adenoma   COLONOSCOPY N/A 04/11/2014   HYI:FOYD diverticulosis in the sigmoid colon/left colon is redundant   CORONARY ARTERY BYPASS GRAFT     ESOPHAGEAL DILATION N/A 04/11/2014   Procedure: ESOPHAGEAL DILATION;  Surgeon: Danny Binder, MD;  [facility];  Service: Endoscopy;  Laterality: N/A;   ESOPHAGOGASTRODUODENOSCOPY N/A 04/11/2014   XAJ:[id number] at  the gastro junction/moderate erosive/duodenal web   EYE SURGERY Bilateral    cataracts   RIGHT/LEFT HEART CATH AND CORONARY/GRAFT ANGIOGRAPHY N/A 02/11/2020   Procedure: RIGHT/LEFT HEART CATH AND CORONARY/GRAFT ANGIOGRAPHY;  Surgeon: Martinique, Peter M, MD;  Location: [place] [facility];  Service: Cardiovascular;  Laterality: N/A;   SKIN CANCER EXCISION      Social History   Socioeconomic History   Marital status: Married    Spouse name: ruby   Number of children: 1   Years of education: Not on file   Highest education level: Not on file  Occupational History   Occupation: retired    Fish farm manager: [employer]  Tobacco Use   Smoking status: Former    Pack years: 0.00    Types: Cigarettes    Start date: 01/24/1950    Quit date: 01/24/1961    Years since quitting: 59.4   Smokeless tobacco: Never  Vaping Use   Vaping  Use: Never used  Substance and Sexual Activity   Alcohol use: Not Currently    Alcohol/week: 0.0 standard drinks    Comment: not drank since 12/2009   Drug use: No   Sexual activity: Yes    Partners: Female  Other Topics Concern   Not on file  Social History Narrative   Not on file   Social Determinants of Health   Financial Resource Strain: Not on file  Food Insecurity: Not on file  Transportation Needs: Not on file  Physical Activity: Not on file  Stress: Not on file  Social Connections: Not on file  Intimate Partner Violence: Not on file        Objective:    BP 129/73   Pulse 84   Temp 98.1 F (36.7 C) (Temporal)   Ht $R'6\' 2"'Yr$  (1.88 m)   Wt 168 lb 3.2 oz (76.3 kg)   SpO2 96%   BMI 21.60 kg/m   Wt Readings from Last 3 Encounters:  07/10/20 168 lb 3.2 oz (76.3 kg)  05/28/20 168 lb 6.4 oz (76.4 kg)  05/21/20 166 lb 3.2 oz (75.4 kg)    Physical Exam Vitals reviewed.  Constitutional:      General: He is not in acute distress.    Appearance: Normal appearance. He is normal weight. He is not ill-appearing, toxic-appearing or diaphoretic.  HENT:     Head:  Normocephalic and atraumatic.  Eyes:     General: No scleral icterus.       Right eye: No discharge.        Left eye: No discharge.     Conjunctiva/sclera: Conjunctivae normal.  Cardiovascular:     Rate and Rhythm: Normal rate.  Pulmonary:     Effort: Pulmonary effort is normal. No respiratory distress.  Musculoskeletal:        General: Normal range of motion.     Cervical back: Normal range of motion.  Skin:    General: Skin is warm and dry.     Findings: Rash (shingles rash on right side is drying up. Extensive with lots of scabbing. No infection noted.) present.  Neurological:     Mental Status: He is alert and oriented to person, place, and time. Mental status is at baseline.  Psychiatric:        Mood and Affect: Mood normal.        Behavior: Behavior normal.        Thought Content: Thought content normal.        Judgment: Judgment normal.    Lab Results  Component Value Date   TSH 0.969 10/14/2019   Lab Results  Component Value Date   WBC 6.6 04/16/2020   HGB 13.9 04/16/2020   HCT 40.8 04/16/2020   MCV 94 04/16/2020   PLT 254 04/16/2020   Lab Results  Component Value Date   NA 130 (L) 04/16/2020   K 4.4 04/16/2020   CO2 23 04/16/2020   GLUCOSE 97 04/16/2020   BUN 14 04/16/2020   CREATININE 0.96 04/16/2020   BILITOT 0.9 04/16/2020   ALKPHOS 179 (H) 04/16/2020   AST 28 04/16/2020   ALT 22 04/16/2020   PROT 6.5 04/16/2020   ALBUMIN 4.3 04/16/2020   CALCIUM 9.5 04/16/2020   ANIONGAP 9 02/06/2020   EGFR 77 04/16/2020   Lab Results  Component Value Date   CHOL 209 (H) 04/16/2020   Lab Results  Component Value Date   HDL 77 04/16/2020   Lab Results  Component Value Date  LDLCALC 123 (H) 04/16/2020   Lab Results  Component Value Date   TRIG 52 04/16/2020   Lab Results  Component Value Date   CHOLHDL 2.7 04/16/2020   No results found for: HGBA1C

## 2020-07-13 ENCOUNTER — Encounter: Payer: Self-pay | Admitting: Family Medicine

## 2020-07-16 ENCOUNTER — Other Ambulatory Visit: Payer: Self-pay

## 2020-07-16 ENCOUNTER — Encounter: Payer: Self-pay | Admitting: Family Medicine

## 2020-07-16 ENCOUNTER — Ambulatory Visit (INDEPENDENT_AMBULATORY_CARE_PROVIDER_SITE_OTHER): Payer: Medicare HMO | Admitting: Family Medicine

## 2020-07-16 VITALS — BP 177/95 | HR 91 | Temp 97.0°F | Ht 74.0 in | Wt 170.0 lb

## 2020-07-16 DIAGNOSIS — I10 Essential (primary) hypertension: Secondary | ICD-10-CM | POA: Diagnosis not present

## 2020-07-16 DIAGNOSIS — I480 Paroxysmal atrial fibrillation: Secondary | ICD-10-CM

## 2020-07-16 DIAGNOSIS — B029 Zoster without complications: Secondary | ICD-10-CM | POA: Diagnosis not present

## 2020-07-16 DIAGNOSIS — Z7901 Long term (current) use of anticoagulants: Secondary | ICD-10-CM

## 2020-07-16 LAB — COAGUCHEK XS/INR WAIVED
INR: 1.3 — ABNORMAL HIGH (ref 0.9–1.1)
Prothrombin Time: 15.3 s

## 2020-07-16 NOTE — Progress Notes (Signed)
Assessment & Plan:  1-2. Paroxysmal atrial fibrillation (HCC)/Long term (current) use of anticoagulants Description   INR today 1.3 (goal is 2-3).  Increase to 7.5 mg x5 days, then take 5 mg x2 days, (next INR) then hopefully continue 7.5 every Tuesday and Saturday with 5 mg on Sunday, Monday, Wednesday, Thursday, and Friday.   Re-check 1 week      - CoaguChek XS/INR Waived  3. Herpes zoster without complication Resolving.  4. Essential hypertension Elevated today, but he has not yet had his blood pressure this morning.   Return in about 1 week (around 07/23/2020) for INR.  [name], MSN, APRN, FNP-C [facility]  Subjective:    Patient ID: Danny [name]., male    DOB: [birth year], 87 y.o.   MRN: [id number]  Patient Care Team: Loman Brooklyn, FNP as PCP - General (Family Medicine) Josue Hector, MD as PCP - Cardiology (Cardiology) Josue Hector, MD as Attending Physician (Cardiology) [name], MD (Inactive) as Consulting Physician (Gastroenterology) Okey Regal, OD (Optometry) Rexene Alberts, MD as Consulting Physician (Cardiothoracic Surgery) Ilean China, RN as Registered Nurse Allyn Kenner, MD as Consulting Physician (Dermatology)   Chief Complaint:  Chief Complaint  Patient presents with   Coagulation Disorder    HPI: Danny [name]. is a 87 y.o. male presenting on 07/16/2020 for Coagulation Disorder  Patient is accompanied by his wife who he is okay with being present.  Anticoagulation: Patient here for anticoagulation monitoring. Indication: atrial fibrillation Bleeding Signs/Symptoms:  None Thromboembolic Signs/Symptoms:  None  Missed Coumadin Doses: Not in the past week Medication Changes: None Dietary Changes:  no Bacterial/Viral Infection:  no   Shingles: patient reports his shingles is drying up and the scabs are going away.  He tried taking gabapentin for the pain but states it made him feel  drunk all day.  The area only itches when something is putting pressure on it.  He is taking Xyzal to help with the itching.  New complaints: None   Social history:  Relevant past medical, surgical, family and social history reviewed and updated as indicated. Interim medical history since our last visit reviewed.  Allergies and medications reviewed and updated.  DATA REVIEWED: CHART IN EPIC  ROS: Negative unless specifically indicated above in HPI.    Current Outpatient Medications:    amiodarone (PACERONE) 200 MG tablet, Take 0.5 tablets (100 mg total) by mouth daily., Disp: 45 tablet, Rfl: 3   cholecalciferol (VITAMIN D) 25 MCG (1000 UNIT) tablet, Take 2,000 Units by mouth daily with breakfast., Disp: , Rfl:    furosemide (LASIX) 20 MG tablet, Take 1 tablet (20 mg total) by mouth daily., Disp: 90 tablet, Rfl: 1   gabapentin (NEURONTIN) 300 MG capsule, Take 1 capsule (300 mg total) by mouth 2 (two) times daily as needed., Disp: 60 capsule, Rfl: 0   levocetirizine (XYZAL) 5 MG tablet, Take 1 tablet (5 mg total) by mouth every evening., Disp: 30 tablet, Rfl: 0   lidocaine (XYLOCAINE) 2 % jelly, Apply 1 application topically as needed., Disp: 20 mL, Rfl: 2   losartan (COZAAR) 50 MG tablet, Take 1 tablet (50 mg total) by mouth every morning., Disp: 90 tablet, Rfl: 1   lovastatin (MEVACOR) 40 MG tablet, Take 1 tablet (40 mg total) by mouth at bedtime., Disp: 90 tablet, Rfl: 3   Multiple Vitamin (MULTIVITAMIN WITH MINERALS) TABS tablet, Take 1 tablet by mouth daily. One-A-Day for Men 50+,  Disp: , Rfl:    Multiple Vitamins-Minerals (PRESERVISION AREDS 2 PO), Take 1 tablet by mouth in the morning and at bedtime., Disp: , Rfl:    nitroGLYCERIN (NITROSTAT) 0.4 MG SL tablet, PLACE 1 TABLET (0.4 MG TOTAL) UNDER THE TONGUE EVERY 5 (FIVE) MINUTES AS NEEDED FOR CHEST PAIN. (Patient taking differently: Place 0.4 mg under the tongue every 5 (five) minutes x 3 doses as needed.), Disp: 100 tablet, Rfl: 3    Omega-3 Fatty Acids (FISH OIL) 1200 MG CAPS, Take 2,400 mg by mouth daily., Disp: , Rfl:    omeprazole (PRILOSEC) 20 MG capsule, Take 1 capsule (20 mg total) by mouth in the morning and at bedtime. 30 minutes before breakfast & supper, Disp: 180 capsule, Rfl: 1   Probiotic Product ([place]vin), Take 1 capsule by mouth daily., Disp: , Rfl:    warfarin (COUMADIN) 5 MG tablet, Take 1 tablet (5 mg total) by mouth daily., Disp: 90 tablet, Rfl: 3   metoprolol tartrate (LOPRESSOR) 50 MG tablet, Take 1 tablet (50 mg total) by mouth 2 (two) times daily., Disp: 180 tablet, Rfl: 3   Allergies  Allergen Reactions   Doxycycline Diarrhea   Contrast Media [Iodinated Diagnostic Agents]     Syncope   Lunesta [Eszopiclone]     "felt WILD"   Morphine And Related Nausea And Vomiting   Trazodone And Nefazodone Other (See Comments)    Extremely drowsy the next day after taking.   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 01/14/2014   Vitamin D deficiency     Past Surgical History:  Procedure Laterality Date   CARDIOVERSION N/A 05/05/2016   Procedure: CARDIOVERSION;  Surgeon: Josue Hector, MD;  Location: [place];  Service: Cardiovascular;  Laterality: N/A;   CAROTID ENDARTERECTOMY Left 2003   CAROTID-SUBCLAVIAN BYPASS GRAFT Right    COLONOSCOPY  2005   hyperplastic polyp x 1 with no adenoma   COLONOSCOPY N/A 04/11/2014   GYI:RSWN diverticulosis in the sigmoid colon/left colon is redundant   CORONARY ARTERY BYPASS GRAFT     ESOPHAGEAL DILATION N/A 04/11/2014   Procedure: ESOPHAGEAL DILATION;  Surgeon: [name], MD;  [facility];  Service: Endoscopy;  Laterality: N/A;   ESOPHAGOGASTRODUODENOSCOPY N/A 04/11/2014   [id number] at the gastro junction/moderate erosive/duodenal web   EYE SURGERY Bilateral    cataracts   RIGHT/LEFT HEART CATH AND CORONARY/GRAFT ANGIOGRAPHY N/A 02/11/2020   Procedure: RIGHT/LEFT HEART CATH AND CORONARY/GRAFT ANGIOGRAPHY;  Surgeon: Martinique, Peter M, MD;  Location: [place] [facility];  Service: Cardiovascular;  Laterality: N/A;   SKIN CANCER EXCISION      Social History   Socioeconomic History   Marital status: Married    Spouse name: ruby   Number of children: 1   Years of education: Not on file   Highest education level: Not on file  Occupational History   Occupation: retired    Fish farm manager: [employer]  Tobacco Use   Smoking status: Former    Pack years: 0.00    Types: Cigarettes  Start date: 01/24/1950    Quit date: 01/24/1961    Years since quitting: 59.5   Smokeless tobacco: Never  Vaping Use   Vaping Use: Never used  Substance and Sexual Activity   Alcohol use: Not Currently    Alcohol/week: 0.0 standard drinks    Comment: not drank since 12/2009   Drug use: No   Sexual activity: Yes    Partners: Female  Other Topics Concern   Not on file  Social History Narrative   Not on file   Social Determinants of Health   Financial Resource Strain: Not on file  Food Insecurity: Not on file  Transportation Needs: Not on file  Physical Activity: Not on file  Stress: Not on file  Social Connections: Not on file  Intimate Partner Violence: Not on file        Objective:    BP (!) 177/95   Pulse 91   Temp (!) 97 F (36.1 C) (Temporal)   Ht 6' 2" (1.88 m)   Wt 170 lb (77.1 kg)   SpO2 100%   BMI 21.83 kg/m   Wt Readings from Last 3 Encounters:  07/16/20 170 lb (77.1 kg)  07/10/20 168 lb 3.2 oz (76.3 kg)  05/28/20 168 lb 6.4 oz (76.4 kg)    Physical Exam Vitals reviewed.  Constitutional:      General: He is not in acute  distress.    Appearance: Normal appearance. He is not ill-appearing, toxic-appearing or diaphoretic.  HENT:     Head: Normocephalic and atraumatic.  Eyes:     General: No scleral icterus.       Right eye: No discharge.        Left eye: No discharge.     Conjunctiva/sclera: Conjunctivae normal.  Cardiovascular:     Rate and Rhythm: Normal rate.  Pulmonary:     Effort: Pulmonary effort is normal. No respiratory distress.  Musculoskeletal:        General: Normal range of motion.     Cervical back: Normal range of motion.  Skin:    General: Skin is warm and dry.  Neurological:     Mental Status: He is alert and oriented to person, place, and time. Mental status is at baseline.  Psychiatric:        Mood and Affect: Mood normal.        Behavior: Behavior normal.        Thought Content: Thought content normal.        Judgment: Judgment normal.    Lab Results  Component Value Date   TSH 0.969 10/14/2019   Lab Results  Component Value Date   WBC 6.6 04/16/2020   HGB 13.9 04/16/2020   HCT 40.8 04/16/2020   MCV 94 04/16/2020   PLT 254 04/16/2020   Lab Results  Component Value Date   NA 130 (L) 04/16/2020   K 4.4 04/16/2020   CO2 23 04/16/2020   GLUCOSE 97 04/16/2020   BUN 14 04/16/2020   CREATININE 0.96 04/16/2020   BILITOT 0.9 04/16/2020   ALKPHOS 179 (H) 04/16/2020   AST 28 04/16/2020   ALT 22 04/16/2020   PROT 6.5 04/16/2020   ALBUMIN 4.3 04/16/2020   CALCIUM 9.5 04/16/2020   ANIONGAP 9 02/06/2020   EGFR 77 04/16/2020   Lab Results  Component Value Date   CHOL 209 (H) 04/16/2020   Lab Results  Component Value Date   HDL 77 04/16/2020   Lab Results  Component Value Date  LDLCALC 123 (H) 04/16/2020   Lab Results  Component Value Date   TRIG 52 04/16/2020   Lab Results  Component Value Date   CHOLHDL 2.7 04/16/2020   No results found for: HGBA1C

## 2020-07-17 ENCOUNTER — Encounter: Payer: Self-pay | Admitting: Family Medicine

## 2020-07-23 ENCOUNTER — Ambulatory Visit (INDEPENDENT_AMBULATORY_CARE_PROVIDER_SITE_OTHER): Payer: Medicare HMO | Admitting: Family Medicine

## 2020-07-23 ENCOUNTER — Other Ambulatory Visit: Payer: Self-pay

## 2020-07-23 ENCOUNTER — Encounter: Payer: Self-pay | Admitting: Family Medicine

## 2020-07-23 VITALS — BP 125/73 | HR 80 | Temp 97.2°F | Ht 74.0 in | Wt 170.6 lb

## 2020-07-23 DIAGNOSIS — I480 Paroxysmal atrial fibrillation: Secondary | ICD-10-CM | POA: Diagnosis not present

## 2020-07-23 DIAGNOSIS — Z7901 Long term (current) use of anticoagulants: Secondary | ICD-10-CM

## 2020-07-23 LAB — COAGUCHEK XS/INR WAIVED
INR: 5.1 (ref 0.9–1.1)
Prothrombin Time: 60.9 s

## 2020-07-23 NOTE — Progress Notes (Signed)
Assessment & Plan:  1-2. Paroxysmal atrial fibrillation (HCC)/Long term (current) use of anticoagulants Description   INR today 4.9 (goal is 2-3).  Hold today, then continue 7.5 every Tuesday and Saturday with 5 mg on Sunday, Monday, Wednesday, Thursday, and Friday.   Re-check 1 week      - CoaguChek XS/INR Waived   Return in 1 week (on 07/30/2020) for INR.  [name], MSN, APRN, FNP-C [facility]  Subjective:    Patient ID: Danny [name]., male    DOB: 23-Apr-1931, 87 y.o.   MRN: [id number]  Patient Care Team: Loman Brooklyn, FNP as PCP - General (Family Medicine) Josue Hector, MD as PCP - Cardiology (Cardiology) Josue Hector, MD as Attending Physician (Cardiology) Danny Binder, MD (Inactive) as Consulting Physician (Gastroenterology) Okey Regal, OD (Optometry) Rexene Alberts, MD as Consulting Physician (Cardiothoracic Surgery) Ilean China, RN as Registered Nurse Allyn Kenner, MD as Consulting Physician (Dermatology)   Chief Complaint:  Chief Complaint  Patient presents with   Coagulation Disorder    HPI: Danny [name]. is a 87 y.o. male presenting on 07/23/2020 for Coagulation Disorder  Anticoagulation: Patient here for anticoagulation monitoring. Indication: atrial fibrillation Bleeding Signs/Symptoms:  None Thromboembolic Signs/Symptoms:  None  Missed Coumadin Doses: None Medication Changes: None Dietary Changes:  no Bacterial/Viral Infection:  no  New complaints: None   Social history:  Relevant past medical, surgical, family and social history reviewed and updated as indicated. Interim medical history since our last visit reviewed.  Allergies and medications reviewed and updated.  DATA REVIEWED: CHART IN EPIC  ROS: Negative unless specifically indicated above in HPI.    Current Outpatient Medications:    amiodarone (PACERONE) 200 MG tablet, Take 0.5 tablets (100 mg total) by mouth daily., Disp:  45 tablet, Rfl: 3   cholecalciferol (VITAMIN D) 25 MCG (1000 UNIT) tablet, Take 2,000 Units by mouth daily with breakfast., Disp: , Rfl:    furosemide (LASIX) 20 MG tablet, Take 1 tablet (20 mg total) by mouth daily., Disp: 90 tablet, Rfl: 1   levocetirizine (XYZAL) 5 MG tablet, Take 1 tablet (5 mg total) by mouth every evening., Disp: 30 tablet, Rfl: 0   losartan (COZAAR) 50 MG tablet, Take 1 tablet (50 mg total) by mouth every morning., Disp: 90 tablet, Rfl: 1   lovastatin (MEVACOR) 40 MG tablet, Take 1 tablet (40 mg total) by mouth at bedtime., Disp: 90 tablet, Rfl: 3   Multiple Vitamin (MULTIVITAMIN WITH MINERALS) TABS tablet, Take 1 tablet by mouth daily. One-A-Day for Men 50+, Disp: , Rfl:    Multiple Vitamins-Minerals (PRESERVISION AREDS 2 PO), Take 1 tablet by mouth in the morning and at bedtime., Disp: , Rfl:    nitroGLYCERIN (NITROSTAT) 0.4 MG SL tablet, PLACE 1 TABLET (0.4 MG TOTAL) UNDER THE TONGUE EVERY 5 (FIVE) MINUTES AS NEEDED FOR CHEST PAIN. (Patient taking differently: Place 0.4 mg under the tongue every 5 (five) minutes x 3 doses as needed.), Disp: 100 tablet, Rfl: 3   Omega-3 Fatty Acids (FISH OIL) 1200 MG CAPS, Take 2,400 mg by mouth daily., Disp: , Rfl:    omeprazole (PRILOSEC) 20 MG capsule, Take 1 capsule (20 mg total) by mouth in the morning and at bedtime. 30 minutes before breakfast & supper, Disp: 180 capsule, Rfl: 1   Probiotic Product ([place]), Take 1 capsule by mouth daily., Disp: , Rfl:    warfarin (COUMADIN) 5 MG tablet, Take 1  tablet (5 mg total) by mouth daily., Disp: 90 tablet, Rfl: 3   metoprolol tartrate (LOPRESSOR) 50 MG tablet, Take 1 tablet (50 mg total) by mouth 2 (two) times daily., Disp: 180 tablet, Rfl: 3   Allergies  Allergen Reactions   Doxycycline Diarrhea   Contrast Media [Iodinated Diagnostic Agents]     Syncope   Lunesta [Eszopiclone]     "felt WILD"   Morphine And Related Nausea And Vomiting   Trazodone And Nefazodone Other  (See Comments)    Extremely drowsy the next day after taking.   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 01/14/2014   Vitamin D deficiency     Past Surgical History:  Procedure Laterality Date   CARDIOVERSION N/A 05/05/2016   Procedure: CARDIOVERSION;  Surgeon: Josue Hector, MD;  Location: [facility];  Service: Cardiovascular;  Laterality: N/A;   CAROTID ENDARTERECTOMY Left 2003   CAROTID-SUBCLAVIAN BYPASS GRAFT Right    COLONOSCOPY  2005   hyperplastic polyp x 1 with no adenoma   COLONOSCOPY N/A 04/11/2014   [id number] diverticulosis in the sigmoid colon/left colon is redundant   CORONARY ARTERY BYPASS GRAFT     ESOPHAGEAL DILATION N/A 04/11/2014   Procedure: ESOPHAGEAL DILATION;  Surgeon: Danny Binder, MD;  [facility];  Service: Endoscopy;  Laterality: N/A;   ESOPHAGOGASTRODUODENOSCOPY N/A 04/11/2014   [id number] at the gastro junction/moderate erosive/duodenal web   EYE SURGERY Bilateral    cataracts   RIGHT/LEFT HEART CATH AND CORONARY/GRAFT ANGIOGRAPHY N/A 02/11/2020   Procedure: RIGHT/LEFT HEART CATH AND CORONARY/GRAFT ANGIOGRAPHY;  Surgeon: Martinique, Peter M, MD;  Location: [facility];  Service: Cardiovascular;  Laterality: N/A;   SKIN CANCER EXCISION      Social History   Socioeconomic History   Marital status: Married    Spouse name: Danny [name]   Number of children: 1   Years of education: Not on file   Highest education level: Not on file  Occupational History   Occupation: retired    Fish farm manager: [employer]  Tobacco Use   Smoking status: Former    Pack years: 0.00    Types:  Cigarettes    Start date: 01/24/1950    Quit date: 01/24/1961    Years since quitting: 59.5   Smokeless tobacco: Never  Vaping Use   Vaping Use: Never used  Substance and Sexual Activity   Alcohol use: Not Currently    Alcohol/week: 0.0 standard drinks    Comment: not drank since 12/2009   Drug use: No   Sexual activity: Yes    Partners: Female  Other Topics Concern   Not on file  Social History Narrative   Not on file   Social Determinants of Health   Financial Resource Strain: Not on file  Food Insecurity: Not on file  Transportation Needs: Not on file  Physical Activity: Not on file  Stress: Not on file  Social Connections: Not on file  Intimate Partner Violence: Not on file        Objective:    BP 125/73   Pulse  80   Temp (!) 97.2 F (36.2 C) (Temporal)   Ht 6' 2"  (1.88 m)   Wt 170 lb 9.6 oz (77.4 kg)   SpO2 100%   BMI 21.90 kg/m   Wt Readings from Last 3 Encounters:  07/23/20 170 lb 9.6 oz (77.4 kg)  07/16/20 170 lb (77.1 kg)  07/10/20 168 lb 3.2 oz (76.3 kg)    Physical Exam Vitals reviewed.  Constitutional:      General: He is not in acute distress.    Appearance: Normal appearance. He is not ill-appearing, toxic-appearing or diaphoretic.  HENT:     Head: Normocephalic and atraumatic.  Eyes:     General: No scleral icterus.       Right eye: No discharge.        Left eye: No discharge.     Conjunctiva/sclera: Conjunctivae normal.  Cardiovascular:     Rate and Rhythm: Normal rate.  Pulmonary:     Effort: Pulmonary effort is normal. No respiratory distress.  Musculoskeletal:        General: Normal range of motion.     Cervical back: Normal range of motion.  Skin:    General: Skin is warm and dry.  Neurological:     Mental Status: He is alert and oriented to person, place, and time. Mental status is at baseline.  Psychiatric:        Mood and Affect: Mood normal.        Behavior: Behavior normal.        Thought Content: Thought content normal.         Judgment: Judgment normal.    Lab Results  Component Value Date   TSH 0.969 10/14/2019   Lab Results  Component Value Date   WBC 6.6 04/16/2020   HGB 13.9 04/16/2020   HCT 40.8 04/16/2020   MCV 94 04/16/2020   PLT 254 04/16/2020   Lab Results  Component Value Date   NA 130 (L) 04/16/2020   K 4.4 04/16/2020   CO2 23 04/16/2020   GLUCOSE 97 04/16/2020   BUN 14 04/16/2020   CREATININE 0.96 04/16/2020   BILITOT 0.9 04/16/2020   ALKPHOS 179 (H) 04/16/2020   AST 28 04/16/2020   ALT 22 04/16/2020   PROT 6.5 04/16/2020   ALBUMIN 4.3 04/16/2020   CALCIUM 9.5 04/16/2020   ANIONGAP 9 02/06/2020   EGFR 77 04/16/2020   Lab Results  Component Value Date   CHOL 209 (H) 04/16/2020   Lab Results  Component Value Date   HDL 77 04/16/2020   Lab Results  Component Value Date   LDLCALC 123 (H) 04/16/2020   Lab Results  Component Value Date   TRIG 52 04/16/2020   Lab Results  Component Value Date   CHOLHDL 2.7 04/16/2020   No results found for: HGBA1C

## 2020-07-26 ENCOUNTER — Encounter: Payer: Self-pay | Admitting: Family Medicine

## 2020-07-30 ENCOUNTER — Encounter: Payer: Self-pay | Admitting: Family Medicine

## 2020-07-30 ENCOUNTER — Ambulatory Visit (INDEPENDENT_AMBULATORY_CARE_PROVIDER_SITE_OTHER): Payer: Medicare HMO | Admitting: Family Medicine

## 2020-07-30 ENCOUNTER — Other Ambulatory Visit: Payer: Self-pay

## 2020-07-30 VITALS — BP 107/73 | HR 90 | Temp 97.3°F | Ht 74.0 in | Wt 171.0 lb

## 2020-07-30 DIAGNOSIS — I480 Paroxysmal atrial fibrillation: Secondary | ICD-10-CM | POA: Diagnosis not present

## 2020-07-30 DIAGNOSIS — R600 Localized edema: Secondary | ICD-10-CM

## 2020-07-30 DIAGNOSIS — Z7901 Long term (current) use of anticoagulants: Secondary | ICD-10-CM

## 2020-07-30 LAB — COAGUCHEK XS/INR WAIVED
INR: 3.8 — ABNORMAL HIGH (ref 0.9–1.1)
Prothrombin Time: 45.8 s

## 2020-07-30 MED ORDER — FUROSEMIDE 20 MG PO TABS
20.0000 mg | ORAL_TABLET | Freq: Every day | ORAL | 1 refills | Status: DC
Start: 2020-07-30 — End: 2020-12-15

## 2020-07-30 NOTE — Progress Notes (Signed)
Assessment & Plan:  1-2. Paroxysmal atrial fibrillation (HCC)/Long term (current) use of anticoagulants Description   INR today 3.8 (goal is 2-3).  Half tablet today, then continue 7.5 every Tuesday and Saturday with 5 mg on Sunday, Monday, Wednesday, Thursday, and Friday.   Re-check 2 week      - CoaguChek XS/INR Waived   Return in about 2 weeks (around 08/13/2020) for INR.  [name], MSN, APRN, FNP-C [facility]  Subjective:    Patient ID: Danny [name]., male    DOB: 06/07/1931, 87 y.o.   MRN: [id number]  Patient Care Team: Loman Brooklyn, FNP as PCP - General (Family Medicine) Josue Hector, MD as PCP - Cardiology (Cardiology) Josue Hector, MD as Attending Physician (Cardiology) [name], MD (Inactive) as Consulting Physician (Gastroenterology) Okey Regal, OD (Optometry) Rexene Alberts, MD as Consulting Physician (Cardiothoracic Surgery) Ilean China, RN as Registered Nurse Allyn Kenner, MD as Consulting Physician (Dermatology)   Chief Complaint:  Chief Complaint  Patient presents with   Coagulation Disorder    HPI: Danny Vogel. is a 87 y.o. male presenting on 07/30/2020 for Coagulation Disorder  Anticoagulation: Patient here for anticoagulation monitoring. Indication: atrial fibrillation Bleeding Signs/Symptoms:  None Thromboembolic Signs/Symptoms:  None  Missed Coumadin Doses: None Medication Changes: None Dietary Changes:  no Bacterial/Viral Infection:  no  New complaints: None   Social history:  Relevant past medical, surgical, family and social history reviewed and updated as indicated. Interim medical history since our last visit reviewed.  Allergies and medications reviewed and updated.  DATA REVIEWED: CHART IN EPIC  ROS: Negative unless specifically indicated above in HPI.    Current Outpatient Medications:    amiodarone (PACERONE) 200 MG tablet, Take 0.5 tablets (100 mg total) by  mouth daily., Disp: 45 tablet, Rfl: 3   cholecalciferol (VITAMIN D) 25 MCG (1000 UNIT) tablet, Take 2,000 Units by mouth daily with breakfast., Disp: , Rfl:    furosemide (LASIX) 20 MG tablet, Take 1 tablet (20 mg total) by mouth daily., Disp: 90 tablet, Rfl: 1   levocetirizine (XYZAL) 5 MG tablet, Take 1 tablet (5 mg total) by mouth every evening., Disp: 30 tablet, Rfl: 0   losartan (COZAAR) 50 MG tablet, Take 1 tablet (50 mg total) by mouth every morning., Disp: 90 tablet, Rfl: 1   lovastatin (MEVACOR) 40 MG tablet, Take 1 tablet (40 mg total) by mouth at bedtime., Disp: 90 tablet, Rfl: 3   Multiple Vitamin (MULTIVITAMIN WITH MINERALS) TABS tablet, Take 1 tablet by mouth daily. One-A-Day for Men 50+, Disp: , Rfl:    Multiple Vitamins-Minerals (PRESERVISION AREDS 2 PO), Take 1 tablet by mouth in the morning and at bedtime., Disp: , Rfl:    nitroGLYCERIN (NITROSTAT) 0.4 MG SL tablet, PLACE 1 TABLET (0.4 MG TOTAL) UNDER THE TONGUE EVERY 5 (FIVE) MINUTES AS NEEDED FOR CHEST PAIN. (Patient taking differently: Place 0.4 mg under the tongue every 5 (five) minutes x 3 doses as needed.), Disp: 100 tablet, Rfl: 3   Omega-3 Fatty Acids (FISH OIL) 1200 MG CAPS, Take 2,400 mg by mouth daily., Disp: , Rfl:    omeprazole (PRILOSEC) 20 MG capsule, Take 1 capsule (20 mg total) by mouth in the morning and at bedtime. 30 minutes before breakfast & supper, Disp: 180 capsule, Rfl: 1   Probiotic Product ([place]), Take 1 capsule by mouth daily., Disp: , Rfl:    warfarin (COUMADIN) 5 MG tablet,  Take 1 tablet (5 mg total) by mouth daily., Disp: 90 tablet, Rfl: 3   metoprolol tartrate (LOPRESSOR) 50 MG tablet, Take 1 tablet (50 mg total) by mouth 2 (two) times daily., Disp: 180 tablet, Rfl: 3   Allergies  Allergen Reactions   Doxycycline Diarrhea   Contrast Media [Iodinated Diagnostic Agents]     Syncope   Lunesta [Eszopiclone]     "felt WILD"   Morphine And Related Nausea And Vomiting   Trazodone  And Nefazodone Other (See Comments)    Extremely drowsy the next day after taking.   Past Medical History:  Diagnosis Date   Anxiety    Atrial fibrillation ([place])    CAD, ARTERY BYPASS GRAFT 05/19/2008   Qualifier: Diagnosis of  By: Mare Ferrari, RMA, Sherri     Enteritis due to Clostridium difficile, history of 11/21/2012   GERD (gastroesophageal reflux disease)    Hyperlipidemia    Hypertension    Internal carotid artery stenosis    Shingles 06/2020   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 01/14/2014   Vitamin D deficiency     Past Surgical History:  Procedure Laterality Date   CARDIOVERSION N/A 05/05/2016   Procedure: CARDIOVERSION;  Surgeon: Josue Hector, MD;  Location: [facility];  Service: Cardiovascular;  Laterality: N/A;   CAROTID ENDARTERECTOMY Left 2003   CAROTID-SUBCLAVIAN BYPASS GRAFT Right    COLONOSCOPY  2005   hyperplastic polyp x 1 with no adenoma   COLONOSCOPY N/A 04/11/2014   [id number]:[id number] diverticulosis in the sigmoid colon/left colon is redundant   CORONARY ARTERY BYPASS GRAFT     ESOPHAGEAL DILATION N/A 04/11/2014   Procedure: ESOPHAGEAL DILATION;  Surgeon: [name], MD;  [facility];  Service: Endoscopy;  Laterality: N/A;   ESOPHAGOGASTRODUODENOSCOPY N/A 04/11/2014   [id number] at the gastro junction/moderate erosive/duodenal web   EYE SURGERY Bilateral    cataracts   RIGHT/LEFT HEART CATH AND CORONARY/GRAFT ANGIOGRAPHY N/A 02/11/2020   Procedure: RIGHT/LEFT HEART CATH AND CORONARY/GRAFT ANGIOGRAPHY;  Surgeon: Martinique, Peter M, MD;  Location: [facility];  Service: Cardiovascular;  Laterality: N/A;   SKIN CANCER EXCISION      Social History   Socioeconomic History   Marital status: Married    Spouse name: ruby   Number of children: 1   Years of education: Not on file   Highest education level: Not on file  Occupational History   Occupation: retired    Fish farm manager: [employer]  Tobacco Use   Smoking status: Former    Pack  years: 0.00    Types: Cigarettes    Start date: 01/24/1950    Quit date: 01/24/1961    Years since quitting: 59.5   Smokeless tobacco: Never  Vaping Use   Vaping Use: Never used  Substance and Sexual Activity   Alcohol use: Not Currently    Alcohol/week: 0.0 standard drinks    Comment: not drank since 12/2009   Drug use: No   Sexual activity: Yes    Partners: Female  Other Topics Concern   Not on file  Social History Narrative   Not on file   Social Determinants of Health   Financial Resource Strain: Not on file  Food Insecurity: Not on file  Transportation Needs: Not on file  Physical Activity: Not on file  Stress: Not on file  Social Connections: Not on file  Intimate Partner Violence: Not on file        Objective:    BP 107/73  Pulse 90   Temp (!) 97.3 F (36.3 C) (Temporal)   Ht 6' 2" (1.88 m)   Wt 171 lb (77.6 kg)   BMI 21.96 kg/m   Wt Readings from Last 3 Encounters:  07/30/20 171 lb (77.6 kg)  07/23/20 170 lb 9.6 oz (77.4 kg)  07/16/20 170 lb (77.1 kg)    Physical Exam Vitals reviewed.  Constitutional:      General: He is not in acute distress.    Appearance: Normal appearance. He is not ill-appearing, toxic-appearing or diaphoretic.  HENT:     Head: Normocephalic and atraumatic.  Eyes:     General: No scleral icterus.       Right eye: No discharge.        Left eye: No discharge.     Conjunctiva/sclera: Conjunctivae normal.  Cardiovascular:     Rate and Rhythm: Normal rate.  Pulmonary:     Effort: Pulmonary effort is normal. No respiratory distress.  Musculoskeletal:        General: Normal range of motion.     Cervical back: Normal range of motion.  Skin:    General: Skin is warm and dry.  Neurological:     Mental Status: He is alert and oriented to person, place, and time. Mental status is at baseline.  Psychiatric:        Mood and Affect: Mood normal.        Behavior: Behavior normal.        Thought Content: Thought content normal.         Judgment: Judgment normal.    Lab Results  Component Value Date   TSH 0.969 10/14/2019   Lab Results  Component Value Date   WBC 6.6 04/16/2020   HGB 13.9 04/16/2020   HCT 40.8 04/16/2020   MCV 94 04/16/2020   PLT 254 04/16/2020   Lab Results  Component Value Date   NA 130 (L) 04/16/2020   K 4.4 04/16/2020   CO2 23 04/16/2020   GLUCOSE 97 04/16/2020   BUN 14 04/16/2020   CREATININE 0.96 04/16/2020   BILITOT 0.9 04/16/2020   ALKPHOS 179 (H) 04/16/2020   AST 28 04/16/2020   ALT 22 04/16/2020   PROT 6.5 04/16/2020   ALBUMIN 4.3 04/16/2020   CALCIUM 9.5 04/16/2020   ANIONGAP 9 02/06/2020   EGFR 77 04/16/2020   Lab Results  Component Value Date   CHOL 209 (H) 04/16/2020   Lab Results  Component Value Date   HDL 77 04/16/2020   Lab Results  Component Value Date   LDLCALC 123 (H) 04/16/2020   Lab Results  Component Value Date   TRIG 52 04/16/2020   Lab Results  Component Value Date   CHOLHDL 2.7 04/16/2020   No results found for: HGBA1C

## 2020-07-30 NOTE — Addendum Note (Signed)
Addended by: Loman Brooklyn on: 07/30/2020 08:23 AM   Modules accepted: Orders

## 2020-08-13 ENCOUNTER — Encounter: Payer: Self-pay | Admitting: Family Medicine

## 2020-08-13 ENCOUNTER — Other Ambulatory Visit: Payer: Self-pay

## 2020-08-13 ENCOUNTER — Ambulatory Visit: Payer: Medicare HMO | Admitting: Family Medicine

## 2020-08-13 ENCOUNTER — Ambulatory Visit (INDEPENDENT_AMBULATORY_CARE_PROVIDER_SITE_OTHER): Payer: Medicare HMO | Admitting: Family Medicine

## 2020-08-13 VITALS — BP 121/65 | HR 92 | Temp 97.0°F | Ht 74.0 in | Wt 169.4 lb

## 2020-08-13 DIAGNOSIS — I480 Paroxysmal atrial fibrillation: Secondary | ICD-10-CM

## 2020-08-13 DIAGNOSIS — R791 Abnormal coagulation profile: Secondary | ICD-10-CM

## 2020-08-13 DIAGNOSIS — Z7901 Long term (current) use of anticoagulants: Secondary | ICD-10-CM | POA: Diagnosis not present

## 2020-08-13 LAB — COAGUCHEK XS/INR WAIVED
INR: 1.6 — ABNORMAL HIGH (ref 0.9–1.1)
Prothrombin Time: 19.3 s

## 2020-08-13 LAB — POCT INR: INR: 1.6 — AB (ref 2–3)

## 2020-08-13 NOTE — Patient Instructions (Signed)
Take 2 tablets of Warfarin today.  Then resume taking 1 tablet daily as you have been.  Keep appointment with [facility] for recheck INR.

## 2020-08-13 NOTE — Progress Notes (Signed)
Subjective: CC: Atrial fibrillation PCP: Loman Brooklyn, FNP [id number] R Garson Degnan. is a 87 y.o. male presenting to clinic today for:  1.  Atrial fibrillation Goal INR 2-3.  He was supratherapeutic at last visit and Coumadin was reduced to 7.5 mg 2 days/week with 5 mg daily all others.  He apparently has only been taking 5 mg nightly.  He has had no increased consumption of green vegetables but does admit that his diet is not as good as it should be because he has been eating essentially what his still wife has been eating which is very little.  He denies any heart palpitations, racing heart, chest pain, shortness of breath, hematochezia, melena, epistaxis or hematuria.  No recent antibiotic use and no change in medications   ROS: Per HPI  Allergies  Allergen Reactions   Doxycycline Diarrhea   Contrast Media [Iodinated Diagnostic Agents]     Syncope   Lunesta [Eszopiclone]     "felt WILD"   Morphine And Related Nausea And Vomiting   Trazodone And Nefazodone Other (See Comments)    Extremely drowsy the next day after taking.   Past Medical History:  Diagnosis Date   Anxiety    Atrial fibrillation ([place])    CAD, ARTERY BYPASS GRAFT 05/19/2008   Qualifier: Diagnosis of  By: Mare Ferrari, RMA, Sherri     Enteritis due to Clostridium difficile, history of 11/21/2012   GERD (gastroesophageal reflux disease)    Hyperlipidemia    Hypertension    Internal carotid artery stenosis    Shingles 06/2020   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 01/14/2014   Vitamin D deficiency     Current Outpatient Medications:    amiodarone (PACERONE) 200 MG tablet, Take 0.5 tablets (100 mg total) by mouth daily., Disp: 45 tablet, Rfl: 3   cholecalciferol (VITAMIN D) 25 MCG (1000 UNIT) tablet, Take 2,000 Units by mouth daily with breakfast., Disp: , Rfl:    furosemide (LASIX) 20 MG tablet, Take 1 tablet (20 mg total) by mouth daily., Disp: 90 tablet, Rfl: 1   levocetirizine (XYZAL) 5 MG  tablet, Take 1 tablet (5 mg total) by mouth every evening., Disp: 30 tablet, Rfl: 0   losartan (COZAAR) 50 MG tablet, Take 1 tablet (50 mg total) by mouth every morning., Disp: 90 tablet, Rfl: 1   lovastatin (MEVACOR) 40 MG tablet, Take 1 tablet (40 mg total) by mouth at bedtime., Disp: 90 tablet, Rfl: 3   Multiple Vitamin (MULTIVITAMIN WITH MINERALS) TABS tablet, Take 1 tablet by mouth daily. One-A-Day for Men 50+, Disp: , Rfl:    Multiple Vitamins-Minerals (PRESERVISION AREDS 2 PO), Take 1 tablet by mouth in the morning and at bedtime., Disp: , Rfl:    nitroGLYCERIN (NITROSTAT) 0.4 MG SL tablet, PLACE 1 TABLET (0.4 MG TOTAL) UNDER THE TONGUE EVERY 5 (FIVE) MINUTES AS NEEDED FOR CHEST PAIN. (Patient taking differently: Place 0.4 mg under the tongue every 5 (five) minutes x 3 doses as needed.), Disp: 100 tablet, Rfl: 3   Omega-3 Fatty Acids (FISH OIL) 1200 MG CAPS, Take 2,400 mg by mouth daily., Disp: , Rfl:    omeprazole (PRILOSEC) 20 MG capsule, Take 1 capsule (20 mg total) by mouth in the morning and at bedtime. 30 minutes before breakfast & supper, Disp: 180 capsule, Rfl: 1   Probiotic Product ([place]), Take 1 capsule by mouth daily., Disp: , Rfl:    warfarin (COUMADIN) 5 MG tablet, Take 1 tablet (5  mg total) by mouth daily., Disp: 90 tablet, Rfl: 3   metoprolol tartrate (LOPRESSOR) 50 MG tablet, Take 1 tablet (50 mg total) by mouth 2 (two) times daily., Disp: 180 tablet, Rfl: 3 Social History   Socioeconomic History   Marital status: Married    Spouse name: ruby   Number of children: 1   Years of education: Not on file   Highest education level: Not on file  Occupational History   Occupation: retired    Fish farm manager: [employer]  Tobacco Use   Smoking status: Former    Types: Cigarettes    Start date: 01/24/1950    Quit date: 01/24/1961    Years since quitting: 59.5   Smokeless tobacco: Never  Vaping Use   Vaping Use: Never used  Substance and Sexual Activity   Alcohol use:  Not Currently    Alcohol/week: 0.0 standard drinks    Comment: not drank since 12/2009   Drug use: No   Sexual activity: Yes    Partners: Female  Other Topics Concern   Not on file  Social History Narrative   Not on file   Social Determinants of Health   Financial Resource Strain: Not on file  Food Insecurity: Not on file  Transportation Needs: Not on file  Physical Activity: Not on file  Stress: Not on file  Social Connections: Not on file  Intimate Partner Violence: Not on file   Family History  Problem Relation Age of Onset   Heart disease Mother    Stroke Mother    Stroke Father    Diabetes Son    Stroke Son    Other Sister        ruptured appendix    Lung cancer Brother        asbestos   Mental illness Sister    Kidney disease Sister    Colon cancer Neg Hx        "not that I know of."    Objective: Office vital signs reviewed. BP 121/65   Pulse 92   Temp (!) 97 F (36.1 C) (Temporal)   Ht '6\' 2"'$  (1.88 m)   Wt 169 lb 6.4 oz (76.8 kg)   BMI 21.75 kg/m   Physical Examination:  General: Awake, alert, thin, elderly male, No acute distress Cardio: Irregularly irregular with bradycardic rate.  S1S2 heard, soft mechanical like murmur heard  Pulm: clear to auscultation bilaterally, no wheezes, rhonchi or rales; normal work of breathing on room air MSK: Ambulating independently  Assessment/ Plan: 87 y.o. male   Subtherapeutic international normalized ratio (INR)  Paroxysmal atrial fibrillation ([place]) - Plan: POCT INR  Long term (current) use of anticoagulants - Plan: CoaguChek XS/INR Waived, POCT INR  INR subtherapeutic at 1.6 today.  Apparently, has only been taking 1 tablet daily despite last note indicating 1.5 tabs 2 days per week and 1 tablet all other days.  Advised to take 2 tablets today then resume 1 tablet daily until checkup in 10 days.  I do wonder if his dietary changes in the setting of his wife's illness may be impacting Coumadin.  Orders  Placed This Encounter  Procedures   CoaguChek XS/INR Waived   No orders of the defined types were placed in this encounter.    Janora Norlander, DO [place] [phone]

## 2020-08-19 ENCOUNTER — Other Ambulatory Visit: Payer: Self-pay | Admitting: Physician Assistant

## 2020-08-25 ENCOUNTER — Other Ambulatory Visit: Payer: Self-pay

## 2020-08-25 ENCOUNTER — Ambulatory Visit (INDEPENDENT_AMBULATORY_CARE_PROVIDER_SITE_OTHER): Payer: Medicare HMO | Admitting: Family Medicine

## 2020-08-25 ENCOUNTER — Encounter: Payer: Self-pay | Admitting: Family Medicine

## 2020-08-25 VITALS — BP 127/73 | HR 88 | Temp 97.9°F | Ht 74.0 in | Wt 167.0 lb

## 2020-08-25 DIAGNOSIS — I480 Paroxysmal atrial fibrillation: Secondary | ICD-10-CM | POA: Diagnosis not present

## 2020-08-25 DIAGNOSIS — Z7901 Long term (current) use of anticoagulants: Secondary | ICD-10-CM | POA: Diagnosis not present

## 2020-08-25 LAB — COAGUCHEK XS/INR WAIVED
INR: 3.1 — ABNORMAL HIGH (ref 0.9–1.1)
Prothrombin Time: 37.6 s

## 2020-08-25 NOTE — Progress Notes (Signed)
Assessment & Plan:  1-2. Paroxysmal atrial fibrillation (HCC)/Long term (current) use of anticoagulants Description   INR 3.1 today. Goal INR 2-3 due to atrial fibrillation  Continue 5 mg daily. Next INR in 2 weeks   - CoaguChek XS/INR Waived   Return in about 2 weeks (around 09/08/2020) for INR.  [name], MSN, APRN, FNP-C [facility]  Subjective:    Patient ID: Danny Cayer., male    DOB: [birth year], 87 y.o.   MRN: [id number]  Patient Care Team: Loman Brooklyn, FNP as PCP - General (Family Medicine) Josue Hector, MD as PCP - Cardiology (Cardiology) Josue Hector, MD as Attending Physician (Cardiology) [name], MD (Inactive) as Consulting Physician (Gastroenterology) Okey Regal, OD (Optometry) Rexene Alberts, MD as Consulting Physician (Cardiothoracic Surgery) Ilean China, RN as Registered Nurse Allyn Kenner, MD as Consulting Physician (Dermatology)   Chief Complaint:  Chief Complaint  Patient presents with   Coagulation Disorder    HPI: Danny Wesche. is a 87 y.o. male presenting on 08/25/2020 for Coagulation Disorder  Anticoagulation: Patient here for anticoagulation monitoring. Indication: atrial fibrillation Bleeding Signs/Symptoms:  None Thromboembolic Signs/Symptoms:  None  Missed Coumadin Doses: None Medication Changes: At last visit 1.5 weeks ago patient was advised to take 2 tablets (10 mg) then resume 1 tablet (5 mg) daily.  Dietary Changes:  no Bacterial/Viral Infection:  no  New complaints: None   Social history:  Relevant past medical, surgical, family and social history reviewed and updated as indicated. Interim medical history since our last visit reviewed.  Allergies and medications reviewed and updated.  DATA REVIEWED: CHART IN EPIC  ROS: Negative unless specifically indicated above in HPI.    Current Outpatient Medications:    amiodarone (PACERONE) 200 MG tablet, Take 0.5  tablets (100 mg total) by mouth daily., Disp: 45 tablet, Rfl: 3   cholecalciferol (VITAMIN D) 25 MCG (1000 UNIT) tablet, Take 2,000 Units by mouth daily with breakfast., Disp: , Rfl:    furosemide (LASIX) 20 MG tablet, Take 1 tablet (20 mg total) by mouth daily., Disp: 90 tablet, Rfl: 1   levocetirizine (XYZAL) 5 MG tablet, Take 1 tablet (5 mg total) by mouth every evening., Disp: 30 tablet, Rfl: 0   losartan (COZAAR) 50 MG tablet, Take 1 tablet (50 mg total) by mouth every morning., Disp: 90 tablet, Rfl: 1   lovastatin (MEVACOR) 40 MG tablet, Take 1 tablet (40 mg total) by mouth at bedtime., Disp: 90 tablet, Rfl: 3   metoprolol tartrate (LOPRESSOR) 50 MG tablet, Take 1 tablet (50 mg total) by mouth 2 (two) times daily. Please schedule appt for future refills. 1st attempt, Disp: 180 tablet, Rfl: 0   Multiple Vitamin (MULTIVITAMIN WITH MINERALS) TABS tablet, Take 1 tablet by mouth daily. One-A-Day for Men 50+, Disp: , Rfl:    Multiple Vitamins-Minerals (PRESERVISION AREDS 2 PO), Take 1 tablet by mouth in the morning and at bedtime., Disp: , Rfl:    nitroGLYCERIN (NITROSTAT) 0.4 MG SL tablet, PLACE 1 TABLET (0.4 MG TOTAL) UNDER THE TONGUE EVERY 5 (FIVE) MINUTES AS NEEDED FOR CHEST PAIN. (Patient taking differently: Place 0.4 mg under the tongue every 5 (five) minutes x 3 doses as needed.), Disp: 100 tablet, Rfl: 3   Omega-3 Fatty Acids (FISH OIL) 1200 MG CAPS, Take 2,400 mg by mouth daily., Disp: , Rfl:    omeprazole (PRILOSEC) 20 MG capsule, Take 1 capsule (20 mg total) by mouth in the  morning and at bedtime. 30 minutes before breakfast & supper, Disp: 180 capsule, Rfl: 1   Probiotic Product ([place]), Take 1 capsule by mouth daily., Disp: , Rfl:    warfarin (COUMADIN) 5 MG tablet, Take 1 tablet (5 mg total) by mouth daily., Disp: 90 tablet, Rfl: 3   Allergies  Allergen Reactions   Doxycycline Diarrhea   Contrast Media [Iodinated Diagnostic Agents]     Syncope   Lunesta  [Eszopiclone]     "felt WILD"   Morphine And Related Nausea And Vomiting   Trazodone And Nefazodone Other (See Comments)    Extremely drowsy the next day after taking.   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 01/14/2014   Vitamin D deficiency     Past Surgical History:  Procedure Laterality Date   CARDIOVERSION N/A 05/05/2016   Procedure: CARDIOVERSION;  Surgeon: Josue Hector, MD;  Location: [place];  Service: Cardiovascular;  Laterality: N/A;   CAROTID ENDARTERECTOMY Left 2003   CAROTID-SUBCLAVIAN BYPASS GRAFT Right    COLONOSCOPY  2005   hyperplastic polyp x 1 with no adenoma   COLONOSCOPY N/A 04/11/2014   BZJ:IRCV diverticulosis in the sigmoid colon/left colon is redundant   CORONARY ARTERY BYPASS GRAFT     ESOPHAGEAL DILATION N/A 04/11/2014   Procedure: ESOPHAGEAL DILATION;  Surgeon: [name], MD;  [facility];  Service: Endoscopy;  Laterality: N/A;   ESOPHAGOGASTRODUODENOSCOPY N/A 04/11/2014   ELF:[id number] at the gastro junction/moderate erosive/duodenal web   EYE SURGERY Bilateral    cataracts   RIGHT/LEFT HEART CATH AND CORONARY/GRAFT ANGIOGRAPHY N/A 02/11/2020   Procedure: RIGHT/LEFT HEART CATH AND CORONARY/GRAFT ANGIOGRAPHY;  Surgeon: Martinique, Peter M, MD;  Location: [place] [facility];  Service: Cardiovascular;  Laterality: N/A;   SKIN CANCER EXCISION      Social History   Socioeconomic History   Marital status: Married    Spouse name: ruby   Number of children: 1   Years of education: Not on file   Highest education level: Not on file  Occupational History    Occupation: retired    Fish farm manager: [employer]  Tobacco Use   Smoking status: Former    Types: Cigarettes    Start date: 01/24/1950    Quit date: 01/24/1961    Years since quitting: 59.6   Smokeless tobacco: Never  Vaping Use   Vaping Use: Never used  Substance and Sexual Activity   Alcohol use: Not Currently    Alcohol/week: 0.0 standard drinks    Comment: not drank since 12/2009   Drug use: No   Sexual activity: Yes    Partners: Female  Other Topics Concern   Not on file  Social History Narrative   Not on file   Social Determinants of Health   Financial Resource Strain: Not on file  Food Insecurity: Not on file  Transportation Needs: Not on file  Physical Activity: Not on file  Stress: Not on file  Social Connections: Not on file  Intimate Partner Violence: Not on file  Objective:    BP 127/73   Pulse 88   Temp 97.9 F (36.6 C) (Temporal)   Ht 6' 2"  (1.88 m)   Wt 167 lb (75.8 kg)   SpO2 97%   BMI 21.44 kg/m   Wt Readings from Last 3 Encounters:  08/25/20 167 lb (75.8 kg)  08/13/20 169 lb 6.4 oz (76.8 kg)  07/30/20 171 lb (77.6 kg)    Physical Exam Vitals reviewed.  Constitutional:      General: He is not in acute distress.    Appearance: Normal appearance. He is not ill-appearing, toxic-appearing or diaphoretic.  HENT:     Head: Normocephalic and atraumatic.  Eyes:     General: No scleral icterus.       Right eye: No discharge.        Left eye: No discharge.     Conjunctiva/sclera: Conjunctivae normal.  Cardiovascular:     Rate and Rhythm: Normal rate.  Pulmonary:     Effort: Pulmonary effort is normal. No respiratory distress.  Musculoskeletal:        General: Normal range of motion.     Cervical back: Normal range of motion.  Skin:    General: Skin is warm and dry.  Neurological:     Mental Status: He is alert and oriented to person, place, and time. Mental status is at baseline.  Psychiatric:        Mood and Affect: Mood normal.         Behavior: Behavior normal.        Thought Content: Thought content normal.        Judgment: Judgment normal.    Lab Results  Component Value Date   TSH 0.969 10/14/2019   Lab Results  Component Value Date   WBC 6.6 04/16/2020   HGB 13.9 04/16/2020   HCT 40.8 04/16/2020   MCV 94 04/16/2020   PLT 254 04/16/2020   Lab Results  Component Value Date   NA 130 (L) 04/16/2020   K 4.4 04/16/2020   CO2 23 04/16/2020   GLUCOSE 97 04/16/2020   BUN 14 04/16/2020   CREATININE 0.96 04/16/2020   BILITOT 0.9 04/16/2020   ALKPHOS 179 (H) 04/16/2020   AST 28 04/16/2020   ALT 22 04/16/2020   PROT 6.5 04/16/2020   ALBUMIN 4.3 04/16/2020   CALCIUM 9.5 04/16/2020   ANIONGAP 9 02/06/2020   EGFR 77 04/16/2020   Lab Results  Component Value Date   CHOL 209 (H) 04/16/2020   Lab Results  Component Value Date   HDL 77 04/16/2020   Lab Results  Component Value Date   LDLCALC 123 (H) 04/16/2020   Lab Results  Component Value Date   TRIG 52 04/16/2020   Lab Results  Component Value Date   CHOLHDL 2.7 04/16/2020   No results found for: HGBA1C

## 2020-08-27 DIAGNOSIS — C44329 Squamous cell carcinoma of skin of other parts of face: Secondary | ICD-10-CM | POA: Diagnosis not present

## 2020-08-27 DIAGNOSIS — D0439 Carcinoma in situ of skin of other parts of face: Secondary | ICD-10-CM | POA: Diagnosis not present

## 2020-08-27 DIAGNOSIS — L570 Actinic keratosis: Secondary | ICD-10-CM | POA: Diagnosis not present

## 2020-08-27 DIAGNOSIS — X32XXXD Exposure to sunlight, subsequent encounter: Secondary | ICD-10-CM | POA: Diagnosis not present

## 2020-09-07 ENCOUNTER — Other Ambulatory Visit (HOSPITAL_COMMUNITY): Payer: Self-pay | Admitting: Physician Assistant

## 2020-09-09 DIAGNOSIS — L570 Actinic keratosis: Secondary | ICD-10-CM | POA: Diagnosis not present

## 2020-09-09 DIAGNOSIS — C44329 Squamous cell carcinoma of skin of other parts of face: Secondary | ICD-10-CM | POA: Diagnosis not present

## 2020-09-09 DIAGNOSIS — X32XXXD Exposure to sunlight, subsequent encounter: Secondary | ICD-10-CM | POA: Diagnosis not present

## 2020-09-11 ENCOUNTER — Encounter: Payer: Self-pay | Admitting: Family Medicine

## 2020-09-11 ENCOUNTER — Ambulatory Visit (INDEPENDENT_AMBULATORY_CARE_PROVIDER_SITE_OTHER): Payer: Medicare HMO | Admitting: Family Medicine

## 2020-09-11 ENCOUNTER — Other Ambulatory Visit: Payer: Self-pay

## 2020-09-11 VITALS — BP 130/69 | HR 82 | Temp 97.8°F | Resp 20 | Ht 74.0 in | Wt 172.0 lb

## 2020-09-11 DIAGNOSIS — Z7901 Long term (current) use of anticoagulants: Secondary | ICD-10-CM | POA: Diagnosis not present

## 2020-09-11 DIAGNOSIS — I480 Paroxysmal atrial fibrillation: Secondary | ICD-10-CM | POA: Diagnosis not present

## 2020-09-11 LAB — COAGUCHEK XS/INR WAIVED
INR: 2.7 — ABNORMAL HIGH (ref 0.9–1.1)
Prothrombin Time: 32.7 s

## 2020-09-11 NOTE — Progress Notes (Signed)
Assessment & Plan:  1-2. Paroxysmal atrial fibrillation (HCC)/Long term (current) use of anticoagulants Description   INR 2.7 today. Goal INR 2-3 due to atrial fibrillation  Continue 5 mg daily. Next INR in 6 weeks   - CoaguChek XS/INR Waived   Return in about 6 weeks (around 10/23/2020) for follow-up of chronic medication conditions.  [name], MSN, APRN, FNP-C [facility]  Subjective:    Patient ID: Danny [name]., male    DOB: May 14, 1931, 87 y.o.   MRN: [id number]  Patient Care Team: Danny Brooklyn, FNP as PCP - General (Family Medicine) Danny Hector, MD as PCP - Cardiology (Cardiology) Danny Hector, MD as Attending Physician (Cardiology) Danny Binder, MD (Inactive) as Consulting Physician (Gastroenterology) Danny [name], OD (Optometry) Danny Alberts, MD (Inactive) as Consulting Physician (Cardiothoracic Surgery) Danny Kenner, MD as Consulting Physician (Dermatology)   Chief Complaint:  Chief Complaint  Patient presents with   Anticoagulation    2 week     HPI: Danny [name]. is a 87 y.o. male presenting on 09/11/2020 for Anticoagulation (2 week )  Anticoagulation: Patient here for anticoagulation monitoring. Indication: atrial fibrillation Bleeding Signs/Symptoms:  Patient did have a spot cut off his forehead by dermatology earlier this week and reports it had to be cauterized. Thromboembolic Signs/Symptoms:  None  Missed Coumadin Doses: Skipped last night after dermatology procedure due to bleeding Medication Changes: None Dietary Changes:  no Bacterial/Viral Infection:  no  New complaints: None   Social history:  Relevant past medical, surgical, family and social history reviewed and updated as indicated. Interim medical history since our last visit reviewed.  Allergies and medications reviewed and updated.  DATA REVIEWED: CHART IN EPIC  ROS: Negative unless specifically indicated above in HPI.     Current Outpatient Medications:    amiodarone (PACERONE) 200 MG tablet, TAKE 1/2 TABLET EVERY DAY, Disp: 15 tablet, Rfl: 0   cholecalciferol (VITAMIN D) 25 MCG (1000 UNIT) tablet, Take 2,000 Units by mouth daily with breakfast., Disp: , Rfl:    furosemide (LASIX) 20 MG tablet, Take 1 tablet (20 mg total) by mouth daily., Disp: 90 tablet, Rfl: 1   levocetirizine (XYZAL) 5 MG tablet, Take 1 tablet (5 mg total) by mouth every evening., Disp: 30 tablet, Rfl: 0   losartan (COZAAR) 50 MG tablet, Take 1 tablet (50 mg total) by mouth every morning., Disp: 90 tablet, Rfl: 1   lovastatin (MEVACOR) 40 MG tablet, Take 1 tablet (40 mg total) by mouth at bedtime., Disp: 90 tablet, Rfl: 3   metoprolol tartrate (LOPRESSOR) 50 MG tablet, Take 1 tablet (50 mg total) by mouth 2 (two) times daily. Please schedule appt for future refills. 1st attempt, Disp: 180 tablet, Rfl: 0   Multiple Vitamin (MULTIVITAMIN WITH MINERALS) TABS tablet, Take 1 tablet by mouth daily. One-A-Day for Men 50+, Disp: , Rfl:    Multiple Vitamins-Minerals (PRESERVISION AREDS 2 PO), Take 1 tablet by mouth in the morning and at bedtime., Disp: , Rfl:    Omega-3 Fatty Acids (FISH OIL) 1200 MG CAPS, Take 2,400 mg by mouth daily., Disp: , Rfl:    omeprazole (PRILOSEC) 20 MG capsule, Take 1 capsule (20 mg total) by mouth in the morning and at bedtime. 30 minutes before breakfast & supper, Disp: 180 capsule, Rfl: 1   Probiotic Product ([place]), Take 1 capsule by mouth daily., Disp: , Rfl:    warfarin (COUMADIN) 5 MG tablet, Take 1  tablet (5 mg total) by mouth daily., Disp: 90 tablet, Rfl: 3   nitroGLYCERIN (NITROSTAT) 0.4 MG SL tablet, PLACE 1 TABLET (0.4 MG TOTAL) UNDER THE TONGUE EVERY 5 (FIVE) MINUTES AS NEEDED FOR CHEST PAIN. (Patient not taking: Reported on 09/11/2020), Disp: 100 tablet, Rfl: 3   Allergies  Allergen Reactions   Doxycycline Diarrhea   Contrast Media [Iodinated Diagnostic Agents]     Syncope   Lunesta  [Eszopiclone]     "felt WILD"   Morphine And Related Nausea And Vomiting   Trazodone And Nefazodone Other (See Comments)    Extremely drowsy the next day after taking.   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 01/14/2014   Vitamin D deficiency     Past Surgical History:  Procedure Laterality Date   CARDIOVERSION N/A 05/05/2016   Procedure: CARDIOVERSION;  Surgeon: Danny Hector, MD;  Location: [place];  Service: Cardiovascular;  Laterality: N/A;   CAROTID ENDARTERECTOMY Left 2003   CAROTID-SUBCLAVIAN BYPASS GRAFT Right    COLONOSCOPY  2005   hyperplastic polyp x 1 with no adenoma   COLONOSCOPY N/A 04/11/2014   HGD:JMEQ diverticulosis in the sigmoid colon/left colon is redundant   CORONARY ARTERY BYPASS GRAFT     ESOPHAGEAL DILATION N/A 04/11/2014   Procedure: ESOPHAGEAL DILATION;  Surgeon: Danny Binder, MD;  [facility];  Service: Endoscopy;  Laterality: N/A;   ESOPHAGOGASTRODUODENOSCOPY N/A 04/11/2014   AST:[id number] at the gastro junction/moderate erosive/duodenal web   EYE SURGERY Bilateral    cataracts   RIGHT/LEFT HEART CATH AND CORONARY/GRAFT ANGIOGRAPHY N/A 02/11/2020   Procedure: RIGHT/LEFT HEART CATH AND CORONARY/GRAFT ANGIOGRAPHY;  Surgeon: Martinique, Peter M, MD;  Location: [place] [facility];  Service: Cardiovascular;  Laterality: N/A;   SKIN CANCER EXCISION      Social History   Socioeconomic History   Marital status: Married    Spouse name: ruby   Number of children: 1   Years of education: Not on file   Highest education level: Not on file  Occupational History    Occupation: retired    Fish farm manager: [employer]  Tobacco Use   Smoking status: Former    Types: Cigarettes    Start date: 01/24/1950    Quit date: 01/24/1961    Years since quitting: 59.6   Smokeless tobacco: Never  Vaping Use   Vaping Use: Never used  Substance and Sexual Activity   Alcohol use: Not Currently    Alcohol/week: 0.0 standard drinks    Comment: not drank since 12/2009   Drug use: No   Sexual activity: Yes    Partners: Female  Other Topics Concern   Not on file  Social History Narrative   Not on file   Social Determinants of Health   Financial Resource Strain: Not on file  Food Insecurity: Not on file  Transportation Needs: Not on file  Physical Activity: Not on file  Stress: Not on file  Social Connections: Not on file  Intimate Partner Violence: Not on file        Objective:  BP 130/69   Pulse 82   Temp 97.8 F (36.6 C)   Resp 20   Ht 6' 2"  (1.88 m)   Wt 172 lb (78 kg)   SpO2 97%   BMI 22.08 kg/m   Wt Readings from Last 3 Encounters:  09/11/20 172 lb (78 kg)  08/25/20 167 lb (75.8 kg)  08/13/20 169 lb 6.4 oz (76.8 kg)    Physical Exam Vitals reviewed.  Constitutional:      General: He is not in acute distress.    Appearance: Normal appearance. He is normal weight. He is not ill-appearing, toxic-appearing or diaphoretic.  HENT:     Head: Normocephalic and atraumatic.  Eyes:     General: No scleral icterus.       Right eye: No discharge.        Left eye: No discharge.     Conjunctiva/sclera: Conjunctivae normal.  Cardiovascular:     Rate and Rhythm: Normal rate.  Pulmonary:     Effort: Pulmonary effort is normal. No respiratory distress.  Musculoskeletal:        General: Normal range of motion.     Cervical back: Normal range of motion.  Skin:    General: Skin is warm and dry.  Neurological:     Mental Status: He is alert and oriented to person, place, and time. Mental status is at baseline.  Psychiatric:        Mood and Affect: Mood  normal.        Behavior: Behavior normal.        Thought Content: Thought content normal.        Judgment: Judgment normal.    Lab Results  Component Value Date   TSH 0.969 10/14/2019   Lab Results  Component Value Date   WBC 6.6 04/16/2020   HGB 13.9 04/16/2020   HCT 40.8 04/16/2020   MCV 94 04/16/2020   PLT 254 04/16/2020   Lab Results  Component Value Date   NA 130 (L) 04/16/2020   K 4.4 04/16/2020   CO2 23 04/16/2020   GLUCOSE 97 04/16/2020   BUN 14 04/16/2020   CREATININE 0.96 04/16/2020   BILITOT 0.9 04/16/2020   ALKPHOS 179 (H) 04/16/2020   AST 28 04/16/2020   ALT 22 04/16/2020   PROT 6.5 04/16/2020   ALBUMIN 4.3 04/16/2020   CALCIUM 9.5 04/16/2020   ANIONGAP 9 02/06/2020   EGFR 77 04/16/2020   Lab Results  Component Value Date   CHOL 209 (H) 04/16/2020   Lab Results  Component Value Date   HDL 77 04/16/2020   Lab Results  Component Value Date   LDLCALC 123 (H) 04/16/2020   Lab Results  Component Value Date   TRIG 52 04/16/2020   Lab Results  Component Value Date   CHOLHDL 2.7 04/16/2020   No results found for: HGBA1C

## 2020-10-21 ENCOUNTER — Other Ambulatory Visit: Payer: Self-pay

## 2020-10-21 ENCOUNTER — Ambulatory Visit (INDEPENDENT_AMBULATORY_CARE_PROVIDER_SITE_OTHER): Payer: Medicare HMO

## 2020-10-21 ENCOUNTER — Other Ambulatory Visit: Payer: Self-pay | Admitting: Cardiovascular Disease

## 2020-10-21 DIAGNOSIS — Z23 Encounter for immunization: Secondary | ICD-10-CM | POA: Diagnosis not present

## 2020-10-26 ENCOUNTER — Other Ambulatory Visit (HOSPITAL_COMMUNITY): Payer: Self-pay | Admitting: Cardiovascular Disease

## 2020-10-28 ENCOUNTER — Encounter: Payer: Self-pay | Admitting: Family Medicine

## 2020-10-28 ENCOUNTER — Other Ambulatory Visit: Payer: Self-pay

## 2020-10-28 ENCOUNTER — Ambulatory Visit (INDEPENDENT_AMBULATORY_CARE_PROVIDER_SITE_OTHER): Payer: Medicare HMO | Admitting: Family Medicine

## 2020-10-28 VITALS — BP 125/74 | HR 80 | Temp 97.4°F | Ht 74.0 in | Wt 171.0 lb

## 2020-10-28 DIAGNOSIS — E7800 Pure hypercholesterolemia, unspecified: Secondary | ICD-10-CM | POA: Diagnosis not present

## 2020-10-28 DIAGNOSIS — I700 Atherosclerosis of aorta: Secondary | ICD-10-CM

## 2020-10-28 DIAGNOSIS — R600 Localized edema: Secondary | ICD-10-CM

## 2020-10-28 DIAGNOSIS — E559 Vitamin D deficiency, unspecified: Secondary | ICD-10-CM | POA: Diagnosis not present

## 2020-10-28 DIAGNOSIS — R52 Pain, unspecified: Secondary | ICD-10-CM

## 2020-10-28 DIAGNOSIS — Z7901 Long term (current) use of anticoagulants: Secondary | ICD-10-CM | POA: Diagnosis not present

## 2020-10-28 DIAGNOSIS — D6869 Other thrombophilia: Secondary | ICD-10-CM

## 2020-10-28 DIAGNOSIS — K219 Gastro-esophageal reflux disease without esophagitis: Secondary | ICD-10-CM

## 2020-10-28 DIAGNOSIS — I480 Paroxysmal atrial fibrillation: Secondary | ICD-10-CM | POA: Diagnosis not present

## 2020-10-28 DIAGNOSIS — B029 Zoster without complications: Secondary | ICD-10-CM

## 2020-10-28 DIAGNOSIS — I10 Essential (primary) hypertension: Secondary | ICD-10-CM | POA: Diagnosis not present

## 2020-10-28 LAB — COAGUCHEK XS/INR WAIVED
INR: 1.3 — ABNORMAL HIGH (ref 0.9–1.1)
Prothrombin Time: 15.6 s

## 2020-10-28 MED ORDER — OMEPRAZOLE 20 MG PO CPDR: 20.0000 mg | DELAYED_RELEASE_CAPSULE | Freq: Two times a day (BID) | ORAL | 1 refills | Status: DC

## 2020-10-28 MED ORDER — LEVOCETIRIZINE DIHYDROCHLORIDE 5 MG PO TABS: 5.0000 mg | ORAL_TABLET | Freq: Every evening | ORAL | 1 refills | Status: DC

## 2020-10-28 MED ORDER — LOSARTAN POTASSIUM 50 MG PO TABS: 50.0000 mg | ORAL_TABLET | Freq: Every morning | ORAL | 1 refills | Status: DC

## 2020-10-28 MED ORDER — TRAMADOL HCL 50 MG PO TABS: 25.0000 mg | ORAL_TABLET | Freq: Two times a day (BID) | ORAL | 0 refills | Status: DC | PRN

## 2020-10-28 NOTE — Patient Instructions (Signed)
If you like the Tramadol and would like to continue, please schedule an appointment with me for pain management before you run out.

## 2020-10-28 NOTE — Progress Notes (Signed)
Assessment & Plan:  1-2. Long term (current) use of anticoagulants/Paroxysmal atrial fibrillation ([place]) Description   INR 1.3 today. Goal INR 2-3 due to atrial fibrillation  Double dose today, then continue 5 mg daily. Next INR at the end of next week.   - CoaguChek XS/INR Waived  3. Secondary hypercoagulable state ([place]) - CBC with Differential/Platelet  4. Essential hypertension - Well controlled on current regimen  - Lipid panel - CBC with Differential/Platelet - CMP14+EGFR - losartan (COZAAR) 50 MG tablet; Take 1 tablet (50 mg total) by mouth every morning.  Dispense: 90 tablet; Refill: 1  5. Thoracic aorta atherosclerosis ([place]) - continue fish oil and statin - Lipid panel  6. HYPERCHOLESTEROLEMIA  IIA - continue fish oil and statin - Lipid panel  7. Gastroesophageal reflux disease, unspecified whether esophagitis present - Well controlled on current regimen - CMP14+EGFR - omeprazole (PRILOSEC) 20 MG capsule; Take 1 capsule (20 mg total) by mouth in the morning and at bedtime. 30 minutes before breakfast & supper  Dispense: 180 capsule; Refill: 1  8. Edema of both lower extremities - Well controlled on current regimen - continue use of compression hose - CMP14+EGFR  9. Vitamin D deficiency Well controlled on current regimen.   10. Generalized pain - encouraged discontinuing use of NSAIDs for pain management as this is interfering with his coumadin dosing - trial of Tramadol. Advised if he does well with it and would like to continue taking it, he needs to schedule an appointment for pain management before he runs out of medication.  - traMADol (ULTRAM) 50 MG tablet; Take 0.5-1 tablets (25-50 mg total) by mouth 2 (two) times daily as needed.  Dispense: 10 tablet; Refill: 0   Return in about 9 days (around 11/06/2020) for INR.  Lucile Crater, NP Student  I personally was present during the history, physical exam, and medical decision-making activities of this  service and have verified that the service and findings are accurately documented in the nurse practitioner student's note.  [name], MSN, APRN, FNP-C [facility]   Subjective:    Patient ID: Danny Lautner., male    DOB: 1931/12/09, 87 y.o.   MRN: [id number]  Patient Care Team: Loman Brooklyn, FNP as PCP - General (Family Medicine) Josue Hector, MD as PCP - Cardiology (Cardiology) Josue Hector, MD as Attending Physician (Cardiology) [name], MD (Inactive) as Consulting Physician (Gastroenterology) Okey Regal, OD (Optometry) Rexene Alberts, MD (Inactive) as Consulting Physician (Cardiothoracic Surgery) Allyn Kenner, MD as Consulting Physician (Dermatology)   Chief Complaint:  Chief Complaint  Patient presents with   Hypertension   Atrial Fibrillation    6 week check up of chronic medical conditions     HPI: Danny Gibbard. is a 87 y.o. male presenting on 10/28/2020 for Hypertension and Atrial Fibrillation (6 week check up of chronic medical conditions )  Anticoagulation: Patient here for anticoagulation monitoring. Indication: atrial fibrillation Bleeding Signs/Symptoms:  None Thromboembolic Signs/Symptoms:  None  Missed Coumadin Doses: Some, he skips doses of coumadin when he takes [facility] which he takes for his general aches and pains. He has tried Tylenol, but does not find it effective. Medication changes: None Dietary Changes:  No Bacterial/Viral Infection:  No  Hypertension/Atrial fibrillation: He is on losartan 50 mg QD and metoprolol 50 mg BID.  Hyperlipidemia/Thoracic aorta atherosclerosis: He is taking a statin.   Peripheral edema: He takes lasix and wears compression hose.  GERD: Taking  Prilosec daily.  Vitamin D Deficiency: Taking vitamin D daily. Last vitamin D level WNL on 04/16/2020.   New complaints: None   Social history:  Relevant past medical, surgical, family and social history reviewed and  updated as indicated. Interim medical history since our last visit reviewed.  Allergies and medications reviewed and updated.  DATA REVIEWED: CHART IN EPIC  ROS: Negative unless specifically indicated above in HPI.    Current Outpatient Medications:    amiodarone (PACERONE) 200 MG tablet, TAKE 1/2 TABLET EVERY DAY (NEED MD APPOINTMENT), Disp: 15 tablet, Rfl: 0   cholecalciferol (VITAMIN D) 25 MCG (1000 UNIT) tablet, Take 2,000 Units by mouth daily with breakfast., Disp: , Rfl:    furosemide (LASIX) 20 MG tablet, Take 1 tablet (20 mg total) by mouth daily., Disp: 90 tablet, Rfl: 1   lovastatin (MEVACOR) 40 MG tablet, Take 1 tablet (40 mg total) by mouth at bedtime., Disp: 90 tablet, Rfl: 3   metoprolol tartrate (LOPRESSOR) 50 MG tablet, Take 1 tablet (50 mg total) by mouth 2 (two) times daily. Please schedule appt for future refills. 1st attempt, Disp: 180 tablet, Rfl: 0   Multiple Vitamin (MULTIVITAMIN WITH MINERALS) TABS tablet, Take 1 tablet by mouth daily. One-A-Day for Men 50+, Disp: , Rfl:    Multiple Vitamins-Minerals (PRESERVISION AREDS 2 PO), Take 1 tablet by mouth in the morning and at bedtime., Disp: , Rfl:    nitroGLYCERIN (NITROSTAT) 0.4 MG SL tablet, PLACE 1 TABLET (0.4 MG TOTAL) UNDER THE TONGUE EVERY 5 (FIVE) MINUTES AS NEEDED FOR CHEST PAIN., Disp: 100 tablet, Rfl: 3   Omega-3 Fatty Acids (FISH OIL) 1200 MG CAPS, Take 2,400 mg by mouth daily., Disp: , Rfl:    Probiotic Product ([place]), Take 1 capsule by mouth daily., Disp: , Rfl:    warfarin (COUMADIN) 5 MG tablet, Take 1 tablet (5 mg total) by mouth daily., Disp: 90 tablet, Rfl: 3   levocetirizine (XYZAL) 5 MG tablet, Take 1 tablet (5 mg total) by mouth every evening., Disp: 90 tablet, Rfl: 1   losartan (COZAAR) 50 MG tablet, Take 1 tablet (50 mg total) by mouth every morning., Disp: 90 tablet, Rfl: 1   omeprazole (PRILOSEC) 20 MG capsule, Take 1 capsule (20 mg total) by mouth in the morning and at bedtime.  30 minutes before breakfast & supper, Disp: 180 capsule, Rfl: 1   Allergies  Allergen Reactions   Doxycycline Diarrhea   Contrast Media [Iodinated Diagnostic Agents]     Syncope   Lunesta [Eszopiclone]     "felt WILD"   Morphine And Related Nausea And Vomiting   Trazodone And Nefazodone Other (See Comments)    Extremely drowsy the next day after taking.   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 01/14/2014   Vitamin D deficiency     Past Surgical History:  Procedure Laterality Date   CARDIOVERSION N/A 05/05/2016   Procedure: CARDIOVERSION;  Surgeon: Josue Hector, MD;  Location: [place];  Service: Cardiovascular;  Laterality: N/A;   CAROTID ENDARTERECTOMY Left 2003  CAROTID-SUBCLAVIAN BYPASS GRAFT Right    COLONOSCOPY  2005   hyperplastic polyp x 1 with no adenoma   COLONOSCOPY N/A 04/11/2014   TDV:VOHY diverticulosis in the sigmoid colon/left colon is redundant   CORONARY ARTERY BYPASS GRAFT     ESOPHAGEAL DILATION N/A 04/11/2014   Procedure: ESOPHAGEAL DILATION;  Surgeon: [name], MD;  [facility];  Service: Endoscopy;  Laterality: N/A;   ESOPHAGOGASTRODUODENOSCOPY N/A 04/11/2014   [id number] at the gastro junction/moderate erosive/duodenal web   EYE SURGERY Bilateral    cataracts   RIGHT/LEFT HEART CATH AND CORONARY/GRAFT ANGIOGRAPHY N/A 02/11/2020   Procedure: RIGHT/LEFT HEART CATH AND CORONARY/GRAFT ANGIOGRAPHY;  Surgeon: Martinique, Peter M, MD;  Location: [place] [facility];  Service: Cardiovascular;  Laterality: N/A;   SKIN CANCER EXCISION      Social History    Socioeconomic History   Marital status: Married    Spouse name: ruby   Number of children: 1   Years of education: Not on file   Highest education level: Not on file  Occupational History   Occupation: retired    Fish farm manager: [employer]  Tobacco Use   Smoking status: Former    Types: Cigarettes    Start date: 01/24/1950    Quit date: 01/24/1961    Years since quitting: 59.8   Smokeless tobacco: Never  Vaping Use   Vaping Use: Never used  Substance and Sexual Activity   Alcohol use: Not Currently    Alcohol/week: 0.0 standard drinks    Comment: not drank since 12/2009   Drug use: No   Sexual activity: Yes    Partners: Female  Other Topics Concern   Not on file  Social History Narrative   Not on file   Social Determinants of Health   Financial Resource Strain: Not on file  Food Insecurity: Not on file  Transportation Needs: Not on file  Physical Activity: Not on file  Stress: Not on file  Social Connections: Not on file  Intimate Partner Violence: Not on file        Objective:    BP 125/74   Pulse 80   Temp (!) 97.4 F (36.3 C) (Temporal)   Ht 6' 2"  (1.88 m)   Wt 77.6 kg   SpO2 100%   BMI 21.96 kg/m   Wt Readings from Last 3 Encounters:  10/28/20 171 lb (77.6 kg)  09/11/20 172 lb (78 kg)  08/25/20 167 lb (75.8 kg)    Physical Exam Vitals reviewed.  Constitutional:      General: He is not in acute distress.    Appearance: Normal appearance. He is normal weight. He is not ill-appearing, toxic-appearing or diaphoretic.  HENT:     Head: Normocephalic and atraumatic.     Right Ear: Tympanic membrane, ear canal and external ear normal. There is no impacted cerumen.     Left Ear: Tympanic membrane, ear canal and external ear normal. There is no impacted cerumen.     Nose: Nose normal. No congestion or rhinorrhea.     Mouth/Throat:     Mouth: Mucous membranes are moist.     Pharynx: Oropharynx is clear. No oropharyngeal exudate or posterior oropharyngeal  erythema.  Eyes:     General: No scleral icterus.       Right eye: No discharge.        Left eye: No discharge.     Conjunctiva/sclera: Conjunctivae normal.     Pupils: Pupils are equal, round, and reactive to light.  Neck:     Vascular: No carotid bruit.  Cardiovascular:     Rate and Rhythm: Normal rate. Rhythm irregular.     Heart sounds: Normal heart sounds. No murmur heard.   No friction rub. No gallop.  Pulmonary:     Effort: Pulmonary effort is normal. No respiratory distress.     Breath sounds: Normal breath sounds. No stridor. No wheezing, rhonchi or rales.  Abdominal:     General: Abdomen is flat. Bowel sounds are normal. There is no distension.     Palpations: Abdomen is soft. There is no hepatomegaly, splenomegaly or mass.     Tenderness: There is no abdominal tenderness. There is no guarding or rebound.     Hernia: No hernia is present.  Musculoskeletal:        General: Normal range of motion.     Cervical back: Normal range of motion and neck supple. No rigidity. No muscular tenderness.     Right lower leg: No edema.     Left lower leg: No edema.  Lymphadenopathy:     Cervical: No cervical adenopathy.  Skin:    General: Skin is warm and dry.     Capillary Refill: Capillary refill takes less than 2 seconds.  Neurological:     General: No focal deficit present.     Mental Status: He is alert and oriented to person, place, and time. Mental status is at baseline.  Psychiatric:        Mood and Affect: Mood normal.        Behavior: Behavior normal.        Thought Content: Thought content normal.        Judgment: Judgment normal.    Lab Results  Component Value Date   TSH 0.969 10/14/2019   Lab Results  Component Value Date   WBC 6.6 04/16/2020   HGB 13.9 04/16/2020   HCT 40.8 04/16/2020   MCV 94 04/16/2020   PLT 254 04/16/2020   Lab Results  Component Value Date   NA 130 (L) 04/16/2020   K 4.4 04/16/2020   CO2 23 04/16/2020   GLUCOSE 97 04/16/2020    BUN 14 04/16/2020   CREATININE 0.96 04/16/2020   BILITOT 0.9 04/16/2020   ALKPHOS 179 (H) 04/16/2020   AST 28 04/16/2020   ALT 22 04/16/2020   PROT 6.5 04/16/2020   ALBUMIN 4.3 04/16/2020   CALCIUM 9.5 04/16/2020   ANIONGAP 9 02/06/2020   EGFR 77 04/16/2020   Lab Results  Component Value Date   CHOL 209 (H) 04/16/2020   Lab Results  Component Value Date   HDL 77 04/16/2020   Lab Results  Component Value Date   LDLCALC 123 (H) 04/16/2020   Lab Results  Component Value Date   TRIG 52 04/16/2020   Lab Results  Component Value Date   CHOLHDL 2.7 04/16/2020   No results found for: HGBA1C

## 2020-10-29 LAB — CBC WITH DIFFERENTIAL/PLATELET
Basophils Absolute: 0.1 10*3/uL (ref 0.0–0.2)
Basos: 1 %
EOS (ABSOLUTE): 0.1 10*3/uL (ref 0.0–0.4)
Eos: 2 %
Hematocrit: 39.6 % (ref 37.5–51.0)
Hemoglobin: 13.8 g/dL (ref 13.0–17.7)
Immature Grans (Abs): 0 10*3/uL (ref 0.0–0.1)
Immature Granulocytes: 1 %
Lymphocytes Absolute: 1.5 10*3/uL (ref 0.7–3.1)
Lymphs: 25 %
MCH: 32.7 pg (ref 26.6–33.0)
MCHC: 34.8 g/dL (ref 31.5–35.7)
MCV: 94 fL (ref 79–97)
Monocytes Absolute: 0.9 10*3/uL (ref 0.1–0.9)
Monocytes: 15 %
Neutrophils Absolute: 3.4 10*3/uL (ref 1.4–7.0)
Neutrophils: 56 %
Platelets: 206 10*3/uL (ref 150–450)
RBC: 4.22 x10E6/uL (ref 4.14–5.80)
RDW: 13.1 % (ref 11.6–15.4)
WBC: 6 10*3/uL (ref 3.4–10.8)

## 2020-10-29 LAB — CMP14+EGFR
ALT: 18 IU/L (ref 0–44)
AST: 25 IU/L (ref 0–40)
Albumin/Globulin Ratio: 1.8 (ref 1.2–2.2)
Albumin: 4.4 g/dL (ref 3.6–4.6)
Alkaline Phosphatase: 225 IU/L — ABNORMAL HIGH (ref 44–121)
BUN/Creatinine Ratio: 17 (ref 10–24)
BUN: 19 mg/dL (ref 8–27)
Bilirubin Total: 0.8 mg/dL (ref 0.0–1.2)
CO2: 24 mmol/L (ref 20–29)
Calcium: 9.2 mg/dL (ref 8.6–10.2)
Chloride: 88 mmol/L — ABNORMAL LOW (ref 96–106)
Creatinine, Ser: 1.12 mg/dL (ref 0.76–1.27)
Globulin, Total: 2.5 g/dL (ref 1.5–4.5)
Glucose: 92 mg/dL (ref 70–99)
Potassium: 5.3 mmol/L — ABNORMAL HIGH (ref 3.5–5.2)
Sodium: 126 mmol/L — ABNORMAL LOW (ref 134–144)
Total Protein: 6.9 g/dL (ref 6.0–8.5)
eGFR: 64 mL/min/{1.73_m2} (ref >59)

## 2020-10-29 LAB — LIPID PANEL
Chol/HDL Ratio: 1.7 ratio (ref 0.0–5.0)
Cholesterol, Total: 150 mg/dL (ref 100–199)
HDL: 88 mg/dL (ref >39)
LDL Chol Calc (NIH): 50 mg/dL (ref 0–99)
Triglycerides: 58 mg/dL (ref 0–149)
VLDL Cholesterol Cal: 12 mg/dL (ref 5–40)

## 2020-10-30 ENCOUNTER — Other Ambulatory Visit: Payer: Self-pay | Admitting: Family Medicine

## 2020-10-30 DIAGNOSIS — I480 Paroxysmal atrial fibrillation: Secondary | ICD-10-CM

## 2020-10-30 DIAGNOSIS — Z7901 Long term (current) use of anticoagulants: Secondary | ICD-10-CM

## 2020-10-30 MED ORDER — OMEPRAZOLE 20 MG PO CPDR: 20.0000 mg | DELAYED_RELEASE_CAPSULE | Freq: Two times a day (BID) | ORAL | 1 refills | Status: DC

## 2020-11-02 ENCOUNTER — Encounter: Payer: Self-pay | Admitting: Family Medicine

## 2020-11-06 ENCOUNTER — Ambulatory Visit (INDEPENDENT_AMBULATORY_CARE_PROVIDER_SITE_OTHER): Payer: Medicare HMO | Admitting: Family Medicine

## 2020-11-06 ENCOUNTER — Encounter: Payer: Self-pay | Admitting: Family Medicine

## 2020-11-06 ENCOUNTER — Other Ambulatory Visit: Payer: Self-pay

## 2020-11-06 VITALS — BP 104/72 | HR 84 | Temp 97.1°F | Ht 74.0 in | Wt 173.0 lb

## 2020-11-06 DIAGNOSIS — Z7901 Long term (current) use of anticoagulants: Secondary | ICD-10-CM | POA: Diagnosis not present

## 2020-11-06 DIAGNOSIS — I480 Paroxysmal atrial fibrillation: Secondary | ICD-10-CM | POA: Diagnosis not present

## 2020-11-06 DIAGNOSIS — E871 Hypo-osmolality and hyponatremia: Secondary | ICD-10-CM

## 2020-11-06 LAB — COAGUCHEK XS/INR WAIVED
INR: 2.1 — ABNORMAL HIGH (ref 0.9–1.1)
Prothrombin Time: 25.1 s

## 2020-11-06 LAB — BMP8+EGFR
BUN/Creatinine Ratio: 18 (ref 10–24)
BUN: 18 mg/dL (ref 8–27)
CO2: 22 mmol/L (ref 20–29)
Calcium: 8.7 mg/dL (ref 8.6–10.2)
Chloride: 87 mmol/L — ABNORMAL LOW (ref 96–106)
Creatinine, Ser: 1.02 mg/dL (ref 0.76–1.27)
Glucose: 130 mg/dL — ABNORMAL HIGH (ref 70–99)
Potassium: 4.6 mmol/L (ref 3.5–5.2)
Sodium: 124 mmol/L — ABNORMAL LOW (ref 134–144)
eGFR: 71 mL/min/{1.73_m2} (ref >59)

## 2020-11-06 NOTE — Progress Notes (Signed)
Assessment & Plan:  1-2. Paroxysmal atrial fibrillation (HCC)/Long term (current) use of anticoagulants Description   INR 2.1 today. Goal INR 2-3 due to atrial fibrillation  Continue 5 mg daily. Next INR in 6 weeks.   - CoaguChek XS/INR Waived  3. Low sodium levels - BMP8+EGFR   Return in about 6 weeks (around 12/18/2020) for INR.  [name], MSN, APRN, FNP-C [facility]  Subjective:    Patient ID: Danny Vanduyne., male    DOB: [birth year], 87 y.o.   MRN: [id number]  Patient Care Team: Loman Brooklyn, FNP as PCP - General (Family Medicine) Josue Hector, MD as PCP - Cardiology (Cardiology) Josue Hector, MD as Attending Physician (Cardiology) [name], MD (Inactive) as Consulting Physician (Gastroenterology) Okey Regal, OD (Optometry) Rexene Alberts, MD (Inactive) as Consulting Physician (Cardiothoracic Surgery) Allyn Kenner, MD as Consulting Physician (Dermatology)   Chief Complaint:  Chief Complaint  Patient presents with   Coagulation Disorder    HPI: Danny Chillemi. is a 87 y.o. male presenting on 11/06/2020 for Coagulation Disorder  Anticoagulation: Patient here for anticoagulation monitoring. Indication: atrial fibrillation Bleeding Signs/Symptoms: None Thromboembolic Signs/Symptoms: None  Missed Coumadin Doses: None Medication Changes: None Dietary Changes:  No Bacterial/Viral Infection:  No  New complaints: None   Social history:  Relevant past medical, surgical, family and social history reviewed and updated as indicated. Interim medical history since our last visit reviewed.  Allergies and medications reviewed and updated.  DATA REVIEWED: CHART IN EPIC  ROS: Negative unless specifically indicated above in HPI.    Current Outpatient Medications:    amiodarone (PACERONE) 200 MG tablet, TAKE 1/2 TABLET EVERY DAY (NEED MD APPOINTMENT), Disp: 15 tablet, Rfl: 0   cholecalciferol (VITAMIN D) 25  MCG (1000 UNIT) tablet, Take 2,000 Units by mouth daily with breakfast., Disp: , Rfl:    furosemide (LASIX) 20 MG tablet, Take 1 tablet (20 mg total) by mouth daily., Disp: 90 tablet, Rfl: 1   losartan (COZAAR) 50 MG tablet, Take 1 tablet (50 mg total) by mouth every morning., Disp: 90 tablet, Rfl: 1   lovastatin (MEVACOR) 40 MG tablet, Take 1 tablet (40 mg total) by mouth at bedtime., Disp: 90 tablet, Rfl: 3   metoprolol tartrate (LOPRESSOR) 50 MG tablet, Take 1 tablet (50 mg total) by mouth 2 (two) times daily. Please schedule appt for future refills. 1st attempt, Disp: 180 tablet, Rfl: 0   Multiple Vitamin (MULTIVITAMIN WITH MINERALS) TABS tablet, Take 1 tablet by mouth daily. One-A-Day for Men 50+, Disp: , Rfl:    Multiple Vitamins-Minerals (PRESERVISION AREDS 2 PO), Take 1 tablet by mouth in the morning and at bedtime., Disp: , Rfl:    nitroGLYCERIN (NITROSTAT) 0.4 MG SL tablet, PLACE 1 TABLET (0.4 MG TOTAL) UNDER THE TONGUE EVERY 5 (FIVE) MINUTES AS NEEDED FOR CHEST PAIN., Disp: 100 tablet, Rfl: 3   Omega-3 Fatty Acids (FISH OIL) 1200 MG CAPS, Take 2,400 mg by mouth daily., Disp: , Rfl:    omeprazole (PRILOSEC) 20 MG capsule, Take 1 capsule (20 mg total) by mouth 2 (two) times daily before a meal., Disp: 180 capsule, Rfl: 1   Probiotic Product ([place]), Take 1 capsule by mouth daily., Disp: , Rfl:    traMADol (ULTRAM) 50 MG tablet, Take 0.5-1 tablets (25-50 mg total) by mouth 2 (two) times daily as needed., Disp: 10 tablet, Rfl: 0   warfarin (COUMADIN) 5 MG tablet, Take 1 tablet (  5 mg total) by mouth daily., Disp: 90 tablet, Rfl: 3   Allergies  Allergen Reactions   Doxycycline Diarrhea   Contrast Media [Iodinated Diagnostic Agents]     Syncope   Lunesta [Eszopiclone]     "felt WILD"   Morphine And Related Nausea And Vomiting   Trazodone And Nefazodone Other (See Comments)    Extremely drowsy the next day after taking.   Past Medical History:  Diagnosis Date    Anxiety    Atrial fibrillation ([place])    CAD, ARTERY BYPASS GRAFT 05/19/2008   Qualifier: Diagnosis of  By: Mare Ferrari, RMA, Sherri     Enteritis due to Clostridium difficile, history of 11/21/2012   GERD (gastroesophageal reflux disease)    Hyperlipidemia    Hypertension    Internal carotid artery stenosis    Shingles 06/2020   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 01/14/2014   Vitamin D deficiency     Past Surgical History:  Procedure Laterality Date   CARDIOVERSION N/A 05/05/2016   Procedure: CARDIOVERSION;  Surgeon: Josue Hector, MD;  Location: [place];  Service: Cardiovascular;  Laterality: N/A;   CAROTID ENDARTERECTOMY Left 2003   CAROTID-SUBCLAVIAN BYPASS GRAFT Right    COLONOSCOPY  2005   hyperplastic polyp x 1 with no adenoma   COLONOSCOPY N/A 04/11/2014   VFI:EPPI diverticulosis in the sigmoid colon/left colon is redundant   CORONARY ARTERY BYPASS GRAFT     ESOPHAGEAL DILATION N/A 04/11/2014   Procedure: ESOPHAGEAL DILATION;  Surgeon: [name], MD;  [facility];  Service: Endoscopy;  Laterality: N/A;   ESOPHAGOGASTRODUODENOSCOPY N/A 04/11/2014   [id number] at the gastro junction/moderate erosive/duodenal web   EYE SURGERY Bilateral    cataracts   RIGHT/LEFT HEART CATH AND CORONARY/GRAFT ANGIOGRAPHY N/A 02/11/2020   Procedure: RIGHT/LEFT HEART CATH AND CORONARY/GRAFT ANGIOGRAPHY;  Surgeon: Martinique, Peter M, MD;  Location: [place] [facility];  Service: Cardiovascular;  Laterality: N/A;   SKIN CANCER EXCISION      Social History   Socioeconomic History   Marital status: Married    Spouse name: ruby   Number of children: 1   Years of education: Not on file   Highest education level: Not on file  Occupational History   Occupation: retired    Fish farm manager: [employer]  Tobacco Use   Smoking status: Former    Types: Cigarettes    Start date: 01/24/1950    Quit date: 01/24/1961    Years since quitting: 59.8   Smokeless tobacco: Never  Vaping Use    Vaping Use: Never used  Substance and Sexual Activity   Alcohol use: Not Currently    Alcohol/week: 0.0 standard drinks    Comment: not drank since 12/2009   Drug use: No   Sexual activity: Yes    Partners: Female  Other Topics Concern   Not on file  Social History Narrative   Not on file   Social Determinants of Health   Financial Resource Strain: Not on file  Food Insecurity: Not on file  Transportation Needs: Not on file  Physical Activity: Not on file  Stress: Not on file  Social Connections: Not on file  Intimate Partner Violence: Not on file        Objective:    BP 104/72   Pulse 84   Temp (!) 97.1 F (36.2 C) (Temporal)   Ht 6' 2"  (1.88 m)   Wt 173 lb (78.5 kg)   BMI 22.21 kg/m   Wt  Readings from Last 3 Encounters:  11/06/20 173 lb (78.5 kg)  10/28/20 171 lb (77.6 kg)  09/11/20 172 lb (78 kg)    Physical Exam Vitals reviewed.  Constitutional:      General: He is not in acute distress.    Appearance: Normal appearance. He is normal weight. He is not ill-appearing, toxic-appearing or diaphoretic.  HENT:     Head: Normocephalic and atraumatic.  Eyes:     General: No scleral icterus.       Right eye: No discharge.        Left eye: No discharge.     Conjunctiva/sclera: Conjunctivae normal.  Cardiovascular:     Rate and Rhythm: Normal rate.  Pulmonary:     Effort: Pulmonary effort is normal. No respiratory distress.  Musculoskeletal:        General: Normal range of motion.     Cervical back: Normal range of motion.  Skin:    General: Skin is warm and dry.  Neurological:     Mental Status: He is alert and oriented to person, place, and time. Mental status is at baseline.  Psychiatric:        Mood and Affect: Mood normal.        Behavior: Behavior normal.        Thought Content: Thought content normal.        Judgment: Judgment normal.    Lab Results  Component Value Date   TSH 0.969 10/14/2019   Lab Results  Component Value Date   WBC  6.0 10/28/2020   HGB 13.8 10/28/2020   HCT 39.6 10/28/2020   MCV 94 10/28/2020   PLT 206 10/28/2020   Lab Results  Component Value Date   NA 126 (L) 10/28/2020   K 5.3 (H) 10/28/2020   CO2 24 10/28/2020   GLUCOSE 92 10/28/2020   BUN 19 10/28/2020   CREATININE 1.12 10/28/2020   BILITOT 0.8 10/28/2020   ALKPHOS 225 (H) 10/28/2020   AST 25 10/28/2020   ALT 18 10/28/2020   PROT 6.9 10/28/2020   ALBUMIN 4.4 10/28/2020   CALCIUM 9.2 10/28/2020   ANIONGAP 9 02/06/2020   EGFR 64 10/28/2020   Lab Results  Component Value Date   CHOL 150 10/28/2020   Lab Results  Component Value Date   HDL 88 10/28/2020   Lab Results  Component Value Date   LDLCALC 50 10/28/2020   Lab Results  Component Value Date   TRIG 58 10/28/2020   Lab Results  Component Value Date   CHOLHDL 1.7 10/28/2020   No results found for: HGBA1C

## 2020-11-09 DIAGNOSIS — X32XXXD Exposure to sunlight, subsequent encounter: Secondary | ICD-10-CM | POA: Diagnosis not present

## 2020-11-09 DIAGNOSIS — Z08 Encounter for follow-up examination after completed treatment for malignant neoplasm: Secondary | ICD-10-CM | POA: Diagnosis not present

## 2020-11-09 DIAGNOSIS — Z85828 Personal history of other malignant neoplasm of skin: Secondary | ICD-10-CM | POA: Diagnosis not present

## 2020-11-09 DIAGNOSIS — L570 Actinic keratosis: Secondary | ICD-10-CM | POA: Diagnosis not present

## 2020-11-18 ENCOUNTER — Other Ambulatory Visit: Payer: Self-pay | Admitting: Cardiovascular Disease

## 2020-12-02 ENCOUNTER — Other Ambulatory Visit: Payer: Self-pay | Admitting: Family Medicine

## 2020-12-02 DIAGNOSIS — Z7901 Long term (current) use of anticoagulants: Secondary | ICD-10-CM

## 2020-12-02 DIAGNOSIS — I480 Paroxysmal atrial fibrillation: Secondary | ICD-10-CM

## 2020-12-09 ENCOUNTER — Other Ambulatory Visit (HOSPITAL_COMMUNITY): Payer: Self-pay | Admitting: Cardiovascular Disease

## 2020-12-09 NOTE — Telephone Encounter (Signed)
This is ai [place] pt

## 2020-12-14 ENCOUNTER — Other Ambulatory Visit: Payer: Self-pay | Admitting: Family Medicine

## 2020-12-14 DIAGNOSIS — R600 Localized edema: Secondary | ICD-10-CM

## 2020-12-16 ENCOUNTER — Ambulatory Visit: Payer: Medicare HMO | Admitting: Family Medicine

## 2020-12-30 ENCOUNTER — Other Ambulatory Visit (HOSPITAL_COMMUNITY): Payer: Self-pay | Admitting: Cardiovascular Disease

## 2020-12-30 NOTE — Telephone Encounter (Signed)
This is a [place] pt.  °

## 2021-02-05 ENCOUNTER — Ambulatory Visit (INDEPENDENT_AMBULATORY_CARE_PROVIDER_SITE_OTHER): Payer: Medicare HMO | Admitting: Family Medicine

## 2021-02-05 ENCOUNTER — Encounter: Payer: Self-pay | Admitting: Family Medicine

## 2021-02-05 ENCOUNTER — Emergency Department (HOSPITAL_COMMUNITY): Payer: Medicare HMO

## 2021-02-05 ENCOUNTER — Inpatient Hospital Stay (HOSPITAL_COMMUNITY)
Admission: EM | Admit: 2021-02-05 | Discharge: 2021-02-10 | DRG: 640 | Disposition: A | Discharge status: Home Health | Payer: Medicare HMO | Attending: Family Medicine | Admitting: Family Medicine

## 2021-02-05 VITALS — BP 195/127 | HR 139 | Temp 98.2°F | Ht 74.0 in | Wt 187.0 lb

## 2021-02-05 DIAGNOSIS — Z888 Allergy status to other drugs, medicaments and biological substances status: Secondary | ICD-10-CM

## 2021-02-05 DIAGNOSIS — Z79891 Long term (current) use of opiate analgesic: Secondary | ICD-10-CM | POA: Diagnosis not present

## 2021-02-05 DIAGNOSIS — D6869 Other thrombophilia: Secondary | ICD-10-CM | POA: Diagnosis present

## 2021-02-05 DIAGNOSIS — R0600 Dyspnea, unspecified: Secondary | ICD-10-CM

## 2021-02-05 DIAGNOSIS — E785 Hyperlipidemia, unspecified: Secondary | ICD-10-CM | POA: Diagnosis present

## 2021-02-05 DIAGNOSIS — I4811 Longstanding persistent atrial fibrillation: Secondary | ICD-10-CM | POA: Diagnosis present

## 2021-02-05 DIAGNOSIS — I480 Paroxysmal atrial fibrillation: Secondary | ICD-10-CM

## 2021-02-05 DIAGNOSIS — Z20822 Contact with and (suspected) exposure to covid-19: Secondary | ICD-10-CM | POA: Diagnosis not present

## 2021-02-05 DIAGNOSIS — I10 Essential (primary) hypertension: Secondary | ICD-10-CM | POA: Diagnosis present

## 2021-02-05 DIAGNOSIS — K5900 Constipation, unspecified: Secondary | ICD-10-CM | POA: Diagnosis not present

## 2021-02-05 DIAGNOSIS — I071 Rheumatic tricuspid insufficiency: Secondary | ICD-10-CM | POA: Diagnosis present

## 2021-02-05 DIAGNOSIS — I5021 Acute systolic (congestive) heart failure: Secondary | ICD-10-CM | POA: Diagnosis not present

## 2021-02-05 DIAGNOSIS — E876 Hypokalemia: Secondary | ICD-10-CM | POA: Diagnosis present

## 2021-02-05 DIAGNOSIS — E869 Volume depletion, unspecified: Secondary | ICD-10-CM | POA: Diagnosis present

## 2021-02-05 DIAGNOSIS — R0602 Shortness of breath: Secondary | ICD-10-CM | POA: Diagnosis not present

## 2021-02-05 DIAGNOSIS — I2510 Atherosclerotic heart disease of native coronary artery without angina pectoris: Secondary | ICD-10-CM | POA: Diagnosis present

## 2021-02-05 DIAGNOSIS — I350 Nonrheumatic aortic (valve) stenosis: Secondary | ICD-10-CM | POA: Diagnosis present

## 2021-02-05 DIAGNOSIS — Z8249 Family history of ischemic heart disease and other diseases of the circulatory system: Secondary | ICD-10-CM

## 2021-02-05 DIAGNOSIS — R110 Nausea: Secondary | ICD-10-CM | POA: Diagnosis not present

## 2021-02-05 DIAGNOSIS — I700 Atherosclerosis of aorta: Secondary | ICD-10-CM | POA: Diagnosis not present

## 2021-02-05 DIAGNOSIS — R410 Disorientation, unspecified: Secondary | ICD-10-CM

## 2021-02-05 DIAGNOSIS — I272 Pulmonary hypertension, unspecified: Secondary | ICD-10-CM | POA: Diagnosis present

## 2021-02-05 DIAGNOSIS — J811 Chronic pulmonary edema: Secondary | ICD-10-CM | POA: Diagnosis not present

## 2021-02-05 DIAGNOSIS — Z87891 Personal history of nicotine dependence: Secondary | ICD-10-CM

## 2021-02-05 DIAGNOSIS — I110 Hypertensive heart disease with heart failure: Secondary | ICD-10-CM | POA: Diagnosis present

## 2021-02-05 DIAGNOSIS — J90 Pleural effusion, not elsewhere classified: Secondary | ICD-10-CM | POA: Diagnosis not present

## 2021-02-05 DIAGNOSIS — Z951 Presence of aortocoronary bypass graft: Secondary | ICD-10-CM | POA: Diagnosis not present

## 2021-02-05 DIAGNOSIS — I517 Cardiomegaly: Secondary | ICD-10-CM | POA: Diagnosis not present

## 2021-02-05 DIAGNOSIS — Z7901 Long term (current) use of anticoagulants: Secondary | ICD-10-CM | POA: Diagnosis not present

## 2021-02-05 DIAGNOSIS — I6523 Occlusion and stenosis of bilateral carotid arteries: Secondary | ICD-10-CM | POA: Diagnosis not present

## 2021-02-05 DIAGNOSIS — R140 Abdominal distension (gaseous): Secondary | ICD-10-CM | POA: Diagnosis not present

## 2021-02-05 DIAGNOSIS — I429 Cardiomyopathy, unspecified: Secondary | ICD-10-CM | POA: Diagnosis present

## 2021-02-05 DIAGNOSIS — F419 Anxiety disorder, unspecified: Secondary | ICD-10-CM | POA: Diagnosis present

## 2021-02-05 DIAGNOSIS — K219 Gastro-esophageal reflux disease without esophagitis: Secondary | ICD-10-CM | POA: Diagnosis not present

## 2021-02-05 DIAGNOSIS — R791 Abnormal coagulation profile: Secondary | ICD-10-CM | POA: Diagnosis not present

## 2021-02-05 DIAGNOSIS — M79606 Pain in leg, unspecified: Secondary | ICD-10-CM

## 2021-02-05 DIAGNOSIS — J9811 Atelectasis: Secondary | ICD-10-CM | POA: Diagnosis not present

## 2021-02-05 DIAGNOSIS — I4891 Unspecified atrial fibrillation: Secondary | ICD-10-CM

## 2021-02-05 DIAGNOSIS — Z66 Do not resuscitate: Secondary | ICD-10-CM | POA: Diagnosis not present

## 2021-02-05 DIAGNOSIS — I161 Hypertensive emergency: Secondary | ICD-10-CM

## 2021-02-05 DIAGNOSIS — Z91041 Radiographic dye allergy status: Secondary | ICD-10-CM

## 2021-02-05 DIAGNOSIS — R531 Weakness: Secondary | ICD-10-CM | POA: Diagnosis not present

## 2021-02-05 DIAGNOSIS — E8770 Fluid overload, unspecified: Secondary | ICD-10-CM | POA: Diagnosis not present

## 2021-02-05 DIAGNOSIS — R1084 Generalized abdominal pain: Secondary | ICD-10-CM | POA: Diagnosis not present

## 2021-02-05 DIAGNOSIS — E871 Hypo-osmolality and hyponatremia: Principal | ICD-10-CM | POA: Diagnosis present

## 2021-02-05 DIAGNOSIS — Z881 Allergy status to other antibiotic agents status: Secondary | ICD-10-CM

## 2021-02-05 DIAGNOSIS — G9341 Metabolic encephalopathy: Secondary | ICD-10-CM | POA: Diagnosis not present

## 2021-02-05 DIAGNOSIS — E1151 Type 2 diabetes mellitus with diabetic peripheral angiopathy without gangrene: Secondary | ICD-10-CM | POA: Diagnosis present

## 2021-02-05 DIAGNOSIS — I499 Cardiac arrhythmia, unspecified: Secondary | ICD-10-CM | POA: Diagnosis not present

## 2021-02-05 DIAGNOSIS — Z79899 Other long term (current) drug therapy: Secondary | ICD-10-CM

## 2021-02-05 DIAGNOSIS — I4819 Other persistent atrial fibrillation: Secondary | ICD-10-CM | POA: Diagnosis not present

## 2021-02-05 DIAGNOSIS — Z885 Allergy status to narcotic agent status: Secondary | ICD-10-CM

## 2021-02-05 DIAGNOSIS — R600 Localized edema: Secondary | ICD-10-CM

## 2021-02-05 DIAGNOSIS — R0689 Other abnormalities of breathing: Secondary | ICD-10-CM | POA: Diagnosis not present

## 2021-02-05 DIAGNOSIS — R4182 Altered mental status, unspecified: Secondary | ICD-10-CM | POA: Diagnosis not present

## 2021-02-05 DIAGNOSIS — Z85828 Personal history of other malignant neoplasm of skin: Secondary | ICD-10-CM

## 2021-02-05 DIAGNOSIS — R109 Unspecified abdominal pain: Secondary | ICD-10-CM | POA: Diagnosis not present

## 2021-02-05 DIAGNOSIS — I6529 Occlusion and stenosis of unspecified carotid artery: Secondary | ICD-10-CM | POA: Diagnosis present

## 2021-02-05 LAB — TROPONIN I (HIGH SENSITIVITY)
Troponin I (High Sensitivity): 22 ng/L — ABNORMAL HIGH (ref <18)
Troponin I (High Sensitivity): 22 ng/L — ABNORMAL HIGH (ref <18)

## 2021-02-05 LAB — BASIC METABOLIC PANEL
Anion gap: 12 (ref 5–15)
BUN: 28 mg/dL — ABNORMAL HIGH (ref 8–23)
CO2: 21 mmol/L — ABNORMAL LOW (ref 22–32)
Calcium: 8.9 mg/dL (ref 8.9–10.3)
Chloride: 87 mmol/L — ABNORMAL LOW (ref 98–111)
Creatinine, Ser: 1.27 mg/dL — ABNORMAL HIGH (ref 0.61–1.24)
GFR, Estimated: 55 mL/min — ABNORMAL LOW (ref >60)
Glucose, Bld: 111 mg/dL — ABNORMAL HIGH (ref 70–99)
Potassium: 4.6 mmol/L (ref 3.5–5.1)
Sodium: 120 mmol/L — ABNORMAL LOW (ref 135–145)

## 2021-02-05 LAB — TSH: TSH: 0.838 u[IU]/mL (ref 0.350–4.500)

## 2021-02-05 LAB — BLOOD GAS, VENOUS
Acid-base deficit: 2.6 mmol/L — ABNORMAL HIGH (ref 0.0–2.0)
Bicarbonate: 21.3 mmol/L (ref 20.0–28.0)
Drawn by: 61882
FIO2: 28
O2 Saturation: 40.2 %
Patient temperature: 36.4
pCO2, Ven: 34.3 mmHg — ABNORMAL LOW (ref 44.0–60.0)
pH, Ven: 7.409 (ref 7.250–7.430)
pO2, Ven: <31 mmHg — CL (ref 32.0–45.0)

## 2021-02-05 LAB — CBC
HCT: 40 % (ref 39.0–52.0)
Hemoglobin: 14.4 g/dL (ref 13.0–17.0)
MCH: 32.1 pg (ref 26.0–34.0)
MCHC: 36 g/dL (ref 30.0–36.0)
MCV: 89.3 fL (ref 80.0–100.0)
Platelets: 188 10*3/uL (ref 150–400)
RBC: 4.48 MIL/uL (ref 4.22–5.81)
RDW: 15 % (ref 11.5–15.5)
WBC: 7.9 10*3/uL (ref 4.0–10.5)
nRBC: 0 % (ref 0.0–0.2)

## 2021-02-05 LAB — OSMOLALITY: Osmolality: 264 mOsm/kg — ABNORMAL LOW (ref 275–295)

## 2021-02-05 LAB — COMPREHENSIVE METABOLIC PANEL
ALT: 34 U/L (ref 0–44)
AST: 51 U/L — ABNORMAL HIGH (ref 15–41)
Albumin: 4.1 g/dL (ref 3.5–5.0)
Alkaline Phosphatase: 198 U/L — ABNORMAL HIGH (ref 38–126)
Anion gap: 14 (ref 5–15)
BUN: 27 mg/dL — ABNORMAL HIGH (ref 8–23)
CO2: 20 mmol/L — ABNORMAL LOW (ref 22–32)
Calcium: 9.1 mg/dL (ref 8.9–10.3)
Chloride: 86 mmol/L — ABNORMAL LOW (ref 98–111)
Creatinine, Ser: 1.22 mg/dL (ref 0.61–1.24)
GFR, Estimated: 57 mL/min — ABNORMAL LOW (ref >60)
Glucose, Bld: 111 mg/dL — ABNORMAL HIGH (ref 70–99)
Potassium: 4.2 mmol/L (ref 3.5–5.1)
Sodium: 120 mmol/L — ABNORMAL LOW (ref 135–145)
Total Bilirubin: 1.4 mg/dL — ABNORMAL HIGH (ref 0.3–1.2)
Total Protein: 7.7 g/dL (ref 6.5–8.1)

## 2021-02-05 LAB — AMMONIA: Ammonia: 22 umol/L (ref 9–35)

## 2021-02-05 LAB — MAGNESIUM: Magnesium: 1.9 mg/dL (ref 1.7–2.4)

## 2021-02-05 LAB — SODIUM
Sodium: 120 mmol/L — ABNORMAL LOW (ref 135–145)
Sodium: 123 mmol/L — ABNORMAL LOW (ref 135–145)

## 2021-02-05 LAB — LACTIC ACID, PLASMA: Lactic Acid, Venous: 2.1 mmol/L (ref 0.5–1.9)

## 2021-02-05 LAB — PROTIME-INR
INR: 6.5 (ref 0.8–1.2)
Prothrombin Time: 57.2 seconds — ABNORMAL HIGH (ref 11.4–15.2)

## 2021-02-05 LAB — ETHANOL: Alcohol, Ethyl (B): <10 mg/dL (ref <10)

## 2021-02-05 LAB — RESP PANEL BY RT-PCR (FLU A&B, COVID) ARPGX2
Influenza A by PCR: NEGATIVE
Influenza B by PCR: NEGATIVE
SARS Coronavirus 2 by RT PCR: NEGATIVE

## 2021-02-05 LAB — CBG MONITORING, ED: Glucose-Capillary: 104 mg/dL — ABNORMAL HIGH (ref 70–99)

## 2021-02-05 MED ORDER — SODIUM CHLORIDE 3 % IV SOLN
INTRAVENOUS | Status: DC
  Filled 2021-02-05 (×4): qty 500

## 2021-02-05 MED ORDER — FUROSEMIDE 20 MG PO TABS
20.0000 mg | ORAL_TABLET | Freq: Every day | ORAL | Status: DC
  Administered 2021-02-06 – 2021-02-08 (×3): 20 mg via ORAL
  Filled 2021-02-05 (×3): qty 1

## 2021-02-05 MED ORDER — DILTIAZEM HCL 25 MG/5ML IV SOLN
10.0000 mg | Freq: Once | INTRAVENOUS | Status: AC
  Administered 2021-02-05: 10 mg via INTRAVENOUS
  Filled 2021-02-05: qty 5

## 2021-02-05 MED ORDER — METOPROLOL TARTRATE 50 MG PO TABS
50.0000 mg | ORAL_TABLET | Freq: Two times a day (BID) | ORAL | Status: DC
  Administered 2021-02-05: 50 mg via ORAL
  Filled 2021-02-05: qty 1

## 2021-02-05 MED ORDER — SODIUM CHLORIDE 0.9 % IV SOLN: INTRAVENOUS | Status: DC

## 2021-02-05 MED ORDER — DOCUSATE SODIUM 100 MG PO CAPS: 100.0000 mg | ORAL_CAPSULE | Freq: Two times a day (BID) | ORAL | Status: DC | PRN

## 2021-02-05 MED ORDER — MELATONIN 3 MG PO TABS
6.0000 mg | ORAL_TABLET | Freq: Once | ORAL | Status: AC
  Administered 2021-02-05: 6 mg via ORAL
  Filled 2021-02-05: qty 2

## 2021-02-05 MED ORDER — AMIODARONE HCL 200 MG PO TABS
100.0000 mg | ORAL_TABLET | Freq: Every day | ORAL | Status: DC
  Administered 2021-02-06 – 2021-02-10 (×5): 100 mg via ORAL
  Filled 2021-02-05 (×5): qty 1

## 2021-02-05 MED ORDER — PRAVASTATIN SODIUM 40 MG PO TABS
40.0000 mg | ORAL_TABLET | Freq: Every day | ORAL | Status: DC
  Administered 2021-02-06 – 2021-02-09 (×4): 40 mg via ORAL
  Filled 2021-02-05 (×4): qty 1

## 2021-02-05 MED ORDER — SODIUM CHLORIDE 0.9 % IV BOLUS
500.0000 mL | Freq: Once | INTRAVENOUS | Status: AC
  Administered 2021-02-05: 500 mL via INTRAVENOUS

## 2021-02-05 MED ORDER — PANTOPRAZOLE SODIUM 40 MG PO TBEC
40.0000 mg | DELAYED_RELEASE_TABLET | Freq: Every day | ORAL | Status: DC
  Administered 2021-02-06 – 2021-02-10 (×5): 40 mg via ORAL
  Filled 2021-02-05 (×5): qty 1

## 2021-02-05 MED ORDER — PHYTONADIONE 5 MG PO TABS
2.5000 mg | ORAL_TABLET | Freq: Once | ORAL | Status: AC
  Administered 2021-02-05: 2.5 mg via ORAL
  Filled 2021-02-05: qty 1

## 2021-02-05 MED ORDER — CHLORHEXIDINE GLUCONATE CLOTH 2 % EX PADS
6.0000 | MEDICATED_PAD | Freq: Every day | CUTANEOUS | Status: DC
  Administered 2021-02-06 – 2021-02-10 (×5): 6 via TOPICAL

## 2021-02-05 MED ORDER — ONDANSETRON HCL 4 MG/2ML IJ SOLN: 4.0000 mg | Freq: Four times a day (QID) | INTRAMUSCULAR | Status: DC | PRN

## 2021-02-05 MED ORDER — POLYETHYLENE GLYCOL 3350 17 G PO PACK: 17.0000 g | PACK | Freq: Every day | ORAL | Status: DC | PRN

## 2021-02-05 NOTE — ED Triage Notes (Signed)
Afib with [facility] pt reports no complaints doctors office sent him here.  Reports no complaints  said Dr Gabriel Carina made him come here.

## 2021-02-05 NOTE — H&P (Signed)
History and Physical  Danny [name]. ZOX:[id number] DOB: [birth year] DOA: 02/05/2021  Referring physician: Margarita Mail, PA-C, EDP PCP: Loman Brooklyn, FNP  Outpatient Specialists:   Patient Coming From: home  Chief Complaint: confusion  HPI: Danny [name]. is a 88 y.o. male with a history of paroxysmal atrial fibrillation on Coumadin, coronary artery disease, hypertension, hyperlipidemia.  Patient was sent to the hospital by his PCP due to elevated blood pressures, confusion.  In the office, his blood pressure was 200/130 with a heart rate in the 130s and mildly confused.  The patient had an EKG and was found to be in A. fib with RVR.  EMS was called and the patient was transported to the hospital.  Initially, the patient states that he did not feel well.  In my interview, the patient states that he is feeling fine, but does not recall much of her earlier today.  He is accompanied by his niece, who provides history.  She states that he has been fairly confused, worse than normal.  No palliating or provoking factors.  Emergency Department Course: Blood work shows supratherapeutic INR 6.5.  Vitamin K given.  Sodium level 120.  IV fluids started.  Review of Systems:   Pt denies any fevers, chills, nausea, vomiting, diarrhea, constipation, abdominal pain, shortness of breath, dyspnea on exertion, orthopnea, cough, wheezing, palpitations, headache, vision changes, lightheadedness, dizziness, melena, rectal bleeding.  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 01/14/2014    Vitamin D deficiency    Past Surgical History:  Procedure Laterality Date   CARDIOVERSION N/A 05/05/2016   Procedure: CARDIOVERSION;  Surgeon: Josue Hector, MD;  Location: [facility];  Service: Cardiovascular;  Laterality: N/A;   CAROTID ENDARTERECTOMY Left 2003   CAROTID-SUBCLAVIAN BYPASS GRAFT Right    COLONOSCOPY  2005   hyperplastic polyp x 1 with no adenoma   COLONOSCOPY N/A 04/11/2014   WJX:BJYN diverticulosis in the sigmoid colon/left colon is redundant   CORONARY ARTERY BYPASS GRAFT     ESOPHAGEAL DILATION N/A 04/11/2014   Procedure: ESOPHAGEAL DILATION;  Surgeon: [name], MD;  [facility];  Service: Endoscopy;  Laterality: N/A;   ESOPHAGOGASTRODUODENOSCOPY N/A 04/11/2014   WGN:FAOZHYQMV at the gastro junction/moderate erosive/duodenal web   EYE SURGERY Bilateral    cataracts   RIGHT/LEFT HEART CATH AND CORONARY/GRAFT ANGIOGRAPHY N/A 02/11/2020   Procedure: RIGHT/LEFT HEART CATH AND CORONARY/GRAFT ANGIOGRAPHY;  Surgeon: Martinique, Peter M, MD;  Location: [facility];  Service: Cardiovascular;  Laterality: N/A;   SKIN CANCER EXCISION     Social History:  reports that he quit smoking about 60 years ago. His smoking use included cigarettes. He started smoking about 71 years ago. He has never used smokeless tobacco. He reports that he does not currently use alcohol. He reports that he does not use drugs. Patient lives at home  Allergies  Allergen Reactions   Doxycycline Diarrhea   Contrast Media [Iodinated Contrast Media]     Syncope   Lunesta [Eszopiclone]     "  felt WILD"   Morphine And Related Nausea And Vomiting   Trazodone And Nefazodone Other (See Comments)    Extremely drowsy the next day after taking.    Family History  Problem Relation Age of Onset   Heart disease Mother    Stroke Mother    Stroke Father    Diabetes Son    Stroke Son    Other Sister        ruptured appendix    Lung cancer Brother        asbestos   Mental illness  Sister    Kidney disease Sister    Colon cancer Neg Hx        "not that I know of."      Prior to Admission medications   Medication Sig Start Date End Date Taking? Authorizing Provider  amiodarone (PACERONE) 200 MG tablet TAKE 1/2 TABLET EVERY DAY (NEED MD APPOINTMENT) Patient taking differently: Take 100 mg by mouth daily. 10/27/20  Yes Josue Hector, MD  cholecalciferol (VITAMIN D) 25 MCG (1000 UNIT) tablet Take 2,000 Units by mouth daily with breakfast.   Yes [provider]  furosemide (LASIX) 20 MG tablet TAKE 1 TABLET EVERY DAY Patient taking differently: Take 20 mg by mouth daily. 12/15/20  Yes [name] F, FNP  levocetirizine (XYZAL) 5 MG tablet Take 5 mg by mouth daily as needed. 01/14/21  Yes [provider]  losartan (COZAAR) 50 MG tablet Take 1 tablet (50 mg total) by mouth every morning. 10/28/20  Yes [name] F, FNP  lovastatin (MEVACOR) 40 MG tablet Take 1 tablet (40 mg total) by mouth at bedtime. 05/06/20  Yes Loman Brooklyn, FNP  metoprolol tartrate (LOPRESSOR) 50 MG tablet Take 1 tablet (50 mg total) by mouth 2 (two) times daily. 11/18/20  Yes Josue Hector, MD  Multiple Vitamin (MULTIVITAMIN WITH MINERALS) TABS tablet Take 1 tablet by mouth daily. One-A-Day for Men 50+   Yes [provider]  Multiple Vitamins-Minerals (PRESERVISION AREDS 2 PO) Take 1 tablet by mouth in the morning and at bedtime.   Yes [provider]  nitroGLYCERIN (NITROSTAT) 0.4 MG SL tablet PLACE 1 TABLET (0.4 MG TOTAL) UNDER THE TONGUE EVERY 5 (FIVE) MINUTES AS NEEDED FOR CHEST PAIN. 01/13/20  Yes Josue Hector, MD  Omega-3 Fatty Acids (FISH OIL) 1200 MG CAPS Take 2,400 mg by mouth daily.   Yes [provider]  omeprazole (PRILOSEC) 20 MG capsule Take 1 capsule (20 mg total) by mouth 2 (two) times daily before a meal. 10/30/20  Yes [name] F, FNP  Probiotic Product ([place]) Take 1 capsule by mouth daily as needed.   Yes  [provider]  warfarin (COUMADIN) 5 MG tablet TAKE 1 TABLET EVERY DAY 12/02/20  Yes Loman Brooklyn, FNP  traMADol (ULTRAM) 50 MG tablet Take 0.5-1 tablets (25-50 mg total) by mouth 2 (two) times daily as needed. Patient not taking: Reported on 02/05/2021 10/28/20   Loman Brooklyn, FNP    Physical Exam: BP (!) 172/153    Pulse 97    Temp (!) 97.5 F (36.4 C) (Oral)    Resp (!) 21    SpO2 98%   General: Elderly male. Awake and alert and oriented x2.  Patient not orientated to time.  No acute cardiopulmonary distress.  HEENT: Normocephalic atraumatic.  Right and left ears normal in appearance.  Pupils equal, round, reactive to light. Extraocular muscles are intact. Sclerae anicteric and noninjected.  Moist mucosal membranes. No mucosal lesions.  Neck: Neck supple without lymphadenopathy. No carotid bruits. No masses palpated.  Cardiovascular: Regular rate with normal S1-S2 sounds. No murmurs, rubs, gallops auscultated. No JVD.  Respiratory: Good respiratory effort with no wheezes, rales, rhonchi. Lungs clear to auscultation bilaterally.  No accessory muscle use. Abdomen: Soft, nontender, nondistended. Active bowel sounds. No masses or hepatosplenomegaly  Skin: No rashes, lesions, or ulcerations.  Dry, warm to touch. 2+ dorsalis pedis and radial pulses. Musculoskeletal: No calf or leg pain. All major joints not erythematous nontender.  No upper or lower joint deformation.  Good ROM.  No contractures  Psychiatric: Impaired judgment and insight. Pleasant and cooperative. Neurologic: No focal neurological deficits. Strength is 5/5 and symmetric in upper and lower extremities.  Cranial nerves II through XII are grossly intact.           Labs on Admission: I have personally reviewed following labs and imaging studies  CBC: Recent Labs  Lab 02/05/21 1353  WBC 7.9  HGB 14.4  HCT 40.0  MCV 89.3  PLT 502   Basic Metabolic Panel: Recent Labs  Lab 02/05/21 1353 02/05/21 1435  02/05/21 1618  NA 120* 120* 120*  K 4.6 4.2  --   CL 87* 86*  --   CO2 21* 20*  --   GLUCOSE 111* 111*  --   BUN 28* 27*  --   CREATININE 1.27* 1.22  --   CALCIUM 8.9 9.1  --   MG 1.9  --   --    GFR: Estimated Creatinine Clearance: 49.6 mL/min (by C-G formula based on SCr of 1.22 mg/dL). Liver Function Tests: Recent Labs  Lab 02/05/21 1435  AST 51*  ALT 34  ALKPHOS 198*  BILITOT 1.4*  PROT 7.7  ALBUMIN 4.1   No results for input(s): LIPASE, AMYLASE in the last 168 hours. Recent Labs  Lab 02/05/21 1435  AMMONIA 22   Coagulation Profile: Recent Labs  Lab 02/05/21 1353  INR 6.5*   Cardiac Enzymes: No results for input(s): CKTOTAL, CKMB, CKMBINDEX, TROPONINI in the last 168 hours. BNP (last 3 results) No results for input(s): PROBNP in the last 8760 hours. HbA1C: No results for input(s): HGBA1C in the last 72 hours. CBG: Recent Labs  Lab 02/05/21 1338  GLUCAP 104*   Lipid Profile: No results for input(s): CHOL, HDL, LDLCALC, TRIG, CHOLHDL, LDLDIRECT in the last 72 hours. Thyroid Function Tests: Recent Labs    02/05/21 1353  TSH 0.838   Anemia Panel: No results for input(s): VITAMINB12, FOLATE, FERRITIN, TIBC, IRON, RETICCTPCT in the last 72 hours. Urine analysis:    Component Value Date/Time   COLORURINE YELLOW 09/19/2012 2034   APPEARANCEUR Clear 05/05/2017 1346   LABSPEC 1.010 09/19/2012 2034   PHURINE 5.5 09/19/2012 2034   GLUCOSEU Negative 05/05/2017 1346   HGBUR NEGATIVE 09/19/2012 2034   BILIRUBINUR Negative 05/05/2017 1346   KETONESUR TRACE (A) 09/19/2012 2034   PROTEINUR Negative 05/05/2017 1346   PROTEINUR NEGATIVE 09/19/2012 2034   UROBILINOGEN negative 02/11/2015 1142   UROBILINOGEN 0.2 09/19/2012 2034   NITRITE Negative 05/05/2017 1346   NITRITE NEGATIVE 09/19/2012 2034   LEUKOCYTESUR Negative 05/05/2017 1346   Sepsis Labs: @LABRCNTIP (procalcitonin:4,lacticidven:4) ) Recent Results (from the past 240 hour(s))  Resp Panel by  RT-PCR (Flu A&B, Covid) Nasopharyngeal Swab     Status: None   Collection Time: 02/05/21  2:09 PM   Specimen: Nasopharyngeal Swab; Nasopharyngeal(NP) swabs in vial transport medium  Result Value Ref Range Status  SARS Coronavirus 2 by RT PCR NEGATIVE NEGATIVE Final    Comment: (NOTE) SARS-CoV-2 target nucleic acids are NOT DETECTED.  The SARS-CoV-2 RNA is generally detectable in upper respiratory specimens during the acute phase of infection. The lowest concentration of SARS-CoV-2 viral copies this assay can detect is 138 copies/mL. A negative result does not preclude SARS-Cov-2 infection and should not be used as the sole basis for treatment or other patient management decisions. A negative result may occur with  improper specimen collection/handling, submission of specimen other than nasopharyngeal swab, presence of viral mutation(s) within the areas targeted by this assay, and inadequate number of viral copies(<138 copies/mL). A negative result must be combined with clinical observations, patient history, and epidemiological information. The expected result is Negative.  Fact Sheet for Patients:  EntrepreneurPulse.com.au  Fact Sheet for Healthcare Providers:  IncredibleEmployment.be  This test is no t yet approved or cleared by the [place] FDA and  has been authorized for detection and/or diagnosis of SARS-CoV-2 by FDA under an Emergency Use Authorization (EUA). This EUA will remain  in effect (meaning this test can be used) for the duration of the COVID-19 declaration under Section 564(b)(1) of the Act, 21 U.S.C.section 360bbb-3(b)(1), unless the authorization is terminated  or revoked sooner.       Influenza A by PCR NEGATIVE NEGATIVE Final   Influenza B by PCR NEGATIVE NEGATIVE Final    Comment: (NOTE) The Xpert Xpress SARS-CoV-2/FLU/RSV plus assay is intended as an aid in the diagnosis of influenza from Nasopharyngeal swab  specimens and should not be used as a sole basis for treatment. Nasal washings and aspirates are unacceptable for Xpert Xpress SARS-CoV-2/FLU/RSV testing.  Fact Sheet for Patients: EntrepreneurPulse.com.au  Fact Sheet for Healthcare Providers: IncredibleEmployment.be  This test is not yet approved or cleared by the [place] FDA and has been authorized for detection and/or diagnosis of SARS-CoV-2 by FDA under an Emergency Use Authorization (EUA). This EUA will remain in effect (meaning this test can be used) for the duration of the COVID-19 declaration under Section 564(b)(1) of the Act, 21 U.S.C. section 360bbb-3(b)(1), unless the authorization is terminated or revoked.  Performed at [facility], [address], [place], [place] [address]   Blood Cultures (routine x 2)     Status: None (Preliminary result)   Collection Time: 02/05/21  2:35 PM   Specimen: BLOOD RIGHT ARM  Result Value Ref Range Status   Specimen Description   Final    BLOOD RIGHT ARM BOTTLES DRAWN AEROBIC AND ANAEROBIC   Special Requests   Final    Blood Culture adequate volume Performed at [facility], [address], [place], [place] [address]    Culture PENDING  Incomplete   Report Status PENDING  Incomplete  Blood Cultures (routine x 2)     Status: None (Preliminary result)   Collection Time: 02/05/21  2:35 PM   Specimen: BLOOD LEFT ARM  Result Value Ref Range Status   Specimen Description BLOOD LEFT ARM BOTTLES DRAWN AEROBIC AND ANAEROBIC  Final   Special Requests   Final    Blood Culture adequate volume Performed at [facility], [address] [place], [place] [address]    Culture PENDING  Incomplete   Report Status PENDING  Incomplete     Radiological Exams on Admission: CT HEAD WO CONTRAST  Result Date: 02/05/2021 CLINICAL DATA:  Mental status change, unknown cause EXAM: CT HEAD WITHOUT CONTRAST TECHNIQUE: Contiguous axial images were obtained from the  base of the  skull through the vertex without intravenous contrast. RADIATION DOSE REDUCTION: This exam was performed according to the departmental dose-optimization program which includes automated exposure control, adjustment of the mA and/or kV according to patient size and/or use of iterative reconstruction technique. COMPARISON:  None. FINDINGS: Brain: No evidence of acute intracranial hemorrhage or extra-axial collection.No evidence of mass lesion/concern mass effect.The ventricles are normal in size.Scattered subcortical and periventricular white matter hypodensities, nonspecific but likely sequela of chronic small vessel ischemic disease. Vascular: No hyperdense vessel or unexpected calcification. Skull: Normal. Negative for fracture or focal lesion. Sinuses/Orbits: There is mucosal thickening of the left maxillary sinus with frothy material. The orbits are unremarkable. Other: None IMPRESSION: No acute intracranial abnormality. Mild sequela of chronic small vessel ischemic disease. Electronically Signed   By: Maurine Simmering M.D.   On: 02/05/2021 15:12   DG Chest Port 1 View  Result Date: 02/05/2021 CLINICAL DATA:  Atrial fibrillation with rapid ventricular response EXAM: PORTABLE CHEST 1 VIEW COMPARISON:  Portable exam 1343 hours compared to 04/07/2016 FINDINGS: Enlargement of cardiac silhouette post CABG. Mild pulmonary vascular congestion. Atherosclerotic calcification aorta. RIGHT pleural effusion and basilar atelectasis. Remaining lungs clear. No definite infiltrate or pneumothorax. Bones demineralized. IMPRESSION: Enlargement of cardiac silhouette with pulmonary vascular congestion post CABG. New RIGHT pleural effusion and basilar atelectasis of unknown etiology; follow-up radiographs until resolution recommended to exclude underlying abnormalities. Aortic Atherosclerosis (ICD10-I70.0). Electronically Signed   By: Lavonia Dana M.D.   On: 02/05/2021 13:57    EKG: Independently reviewed.  A. fib with  RVR.  Old.  Infarct.  No acute ST changes.  Assessment/Plan: Principal Problem:   Acute hyponatremia Active Problems:   Essential hypertension   Carotid stenosis   Paroxysmal atrial fibrillation (HCC)   Secondary hypercoagulable state ([place])   Gastroesophageal reflux disease   Supratherapeutic INR    This patient was discussed with the ED physician, including pertinent vitals, physical exam findings, labs, and imaging.  We also discussed care given by the ED provider.  Acute on chronic hyponatremia It appears that over the past few months, his sodium level has been as low as 124.  There does not appear to be a chronic component to this.  Looking back through the chart, his sodium level was normal last year.  There have not been any dramatic changes to his medications. In looking through his medications, Cozaar can cause hyponatremia.  We will hold Cozaar for now.  Continue metoprolol. IV fluids: Normal saline at 100 mL/h Check urine osmolality and urine sodium. Repeat sodium level tonight and every 4 hours. Supratherapeutic INR  Vitamin K given in the ER Check INR tomorrow Hold Coumadin Atrial fibrillation Initially in RVR.  Status post Cardizem bolus.  Heart rate now controlled Continue amiodarone Continue metoprolol Hypertension Continue metoprolol Carotid stenosis   DVT prophylaxis: Supratherapeutic INR.  Will place SCDs Consultants: None Code Status: Full code confirmed by patient and patient's niece Family Communication: Patient's niece present Disposition Plan: Patient should be able to return home following admission   Truett Mainland, DO

## 2021-02-05 NOTE — Progress Notes (Signed)
Subjective:  Patient ID: Danny Loron., male    DOB: 1931/08/03, 88 y.o.   MRN: [id number]  Patient Care Team: Loman Brooklyn, FNP as PCP - General (Family Medicine) Josue Hector, MD as PCP - Cardiology (Cardiology) Josue Hector, MD as Attending Physician (Cardiology) [name], MD (Inactive) as Consulting Physician (Gastroenterology) Okey Regal, OD (Optometry) Rexene Alberts, MD (Inactive) as Consulting Physician (Cardiothoracic Surgery) Allyn Kenner, MD as Consulting Physician (Dermatology)   Chief Complaint:  Hypertension   HPI: Danny Dunnaway. is a 88 y.o. male presenting on 02/05/2021 for Hypertension   Pt presents today with caregiver for hypertension. He has a significant history including PVD s/p L CEA w/ R subclavian bpg 2009 and PTCA 2010, CAD s/p CABG 2003 w/ LIMA-LAD, RIMA-RI, SVG-OM3, PAF on coumadin, HTN, HLD, and GERD. Caregiver states BP has been high over the last few days. She tried to get him to come to office yesterday and he declined. Today his blood pressure was very elevated and he had some confusion so she convinced him to come to the office for evaluation. Pt noted to be very hypertensive 200/130 manually, HR irregular in the 130s, and slightly confused when he arrived to treatment room. EMS called. EKG completed and revealed A-Fib RVR as expected. Pt denies chest pain, palpitations, or dizziness. Pt states he just does not feel well. He also reports abdominal pain and distention.    Relevant past medical, surgical, family, and social history reviewed and updated as indicated.  Allergies and medications reviewed and updated. Data reviewed: Chart in Epic.   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 01/14/2014   Vitamin D deficiency     Past Surgical History:  Procedure Laterality Date   CARDIOVERSION N/A 05/05/2016   Procedure: CARDIOVERSION;  Surgeon: Josue Hector, MD;  Location: [place];  Service: Cardiovascular;  Laterality: N/A;   CAROTID ENDARTERECTOMY Left 2003   CAROTID-SUBCLAVIAN BYPASS GRAFT Right    COLONOSCOPY  2005   hyperplastic polyp x 1 with no adenoma   COLONOSCOPY N/A 04/11/2014   VZD:GLOV diverticulosis in the sigmoid colon/left colon is redundant   CORONARY ARTERY BYPASS GRAFT     ESOPHAGEAL DILATION N/A 04/11/2014   Procedure: ESOPHAGEAL DILATION;  Surgeon: [name], MD;  [facility];  Service: Endoscopy;  Laterality: N/A;   ESOPHAGOGASTRODUODENOSCOPY N/A 04/11/2014   FIE:PPIRJJOAC at the gastro junction/moderate erosive/duodenal web   EYE SURGERY Bilateral    cataracts   RIGHT/LEFT HEART CATH AND CORONARY/GRAFT ANGIOGRAPHY N/A 02/11/2020   Procedure: RIGHT/LEFT HEART CATH AND CORONARY/GRAFT ANGIOGRAPHY;  Surgeon: Martinique, Peter M, MD;  Location: [facility];  Service: Cardiovascular;  Laterality: N/A;   SKIN CANCER EXCISION      Social History   Socioeconomic History   Marital status: Married    Spouse name: ruby   Number of children: 1   Years of education: Not on file   Highest education level: Not on file  Occupational History   Occupation: retired  Employer: [employer]  Tobacco Use   Smoking status: Former    Types: Cigarettes    Start date: 01/24/1950    Quit date: 01/24/1961    Years since quitting: 60.0   Smokeless tobacco: Never  Vaping Use   Vaping Use: Never used  Substance and Sexual Activity   Alcohol use: Not Currently    Alcohol/week: 0.0 standard drinks    Comment: not drank since 12/2009   Drug use: No   Sexual activity: Yes    Partners: Female  Other Topics  Concern   Not on file  Social History Narrative   Not on file   Social Determinants of Health   Financial Resource Strain: Not on file  Food Insecurity: Not on file  Transportation Needs: Not on file  Physical Activity: Not on file  Stress: Not on file  Social Connections: Not on file  Intimate Partner Violence: Not on file    Outpatient Encounter Medications as of 02/05/2021  Medication Sig   amiodarone (PACERONE) 200 MG tablet TAKE 1/2 TABLET EVERY DAY (NEED MD APPOINTMENT)   cholecalciferol (VITAMIN D) 25 MCG (1000 UNIT) tablet Take 2,000 Units by mouth daily with breakfast.   furosemide (LASIX) 20 MG tablet TAKE 1 TABLET EVERY DAY   losartan (COZAAR) 50 MG tablet Take 1 tablet (50 mg total) by mouth every morning.   lovastatin (MEVACOR) 40 MG tablet Take 1 tablet (40 mg total) by mouth at bedtime.   metoprolol tartrate (LOPRESSOR) 50 MG tablet Take 1 tablet (50 mg total) by mouth 2 (two) times daily.   Multiple Vitamin (MULTIVITAMIN WITH MINERALS) TABS tablet Take 1 tablet by mouth daily. One-A-Day for Men 50+   Multiple Vitamins-Minerals (PRESERVISION AREDS 2 PO) Take 1 tablet by mouth in the morning and at bedtime.   nitroGLYCERIN (NITROSTAT) 0.4 MG SL tablet PLACE 1 TABLET (0.4 MG TOTAL) UNDER THE TONGUE EVERY 5 (FIVE) MINUTES AS NEEDED FOR CHEST PAIN.   Omega-3 Fatty Acids (FISH OIL) 1200 MG CAPS Take 2,400 mg by mouth daily.   omeprazole (PRILOSEC) 20 MG capsule Take 1 capsule (20 mg total) by mouth 2 (two) times daily before a meal.   Probiotic Product (PHILLIPS COLON HEALTH PO) Take 1 capsule by mouth daily.   traMADol (ULTRAM) 50 MG tablet Take 0.5-1 tablets (25-50 mg total) by mouth 2 (two) times daily as needed.   warfarin (COUMADIN) 5 MG tablet TAKE 1 TABLET EVERY DAY   No facility-administered encounter medications on file as of 02/05/2021.    Allergies  Allergen Reactions   Doxycycline Diarrhea   Contrast Media [Iodinated Contrast Media]     Syncope   Lunesta  [Eszopiclone]     "felt WILD"   Morphine And Related Nausea And Vomiting   Trazodone And Nefazodone Other (See Comments)    Extremely drowsy the next day after taking.    Review of Systems  Constitutional:  Positive for activity change, appetite change and fatigue. Negative for chills, diaphoresis, fever and unexpected weight change.  Respiratory:  Positive for cough. Negative for shortness of breath.   Cardiovascular:  Negative for chest pain, palpitations and leg swelling.  Gastrointestinal:  Positive for abdominal distention and abdominal pain.  Neurological:  Positive for facial asymmetry and weakness. Negative for dizziness, tremors, seizures, syncope, speech difficulty, light-headedness, numbness and headaches.  Psychiatric/Behavioral:  Positive for confusion.   All other systems reviewed and are negative.      Objective:  BP (!) 195/127    Pulse (!) 139  Temp 98.2 F (36.8 C)    Ht 6' 2"  (1.88 m)    Wt 187 lb (84.8 kg)    SpO2 98%    BMI 24.01 kg/m    Wt Readings from Last 3 Encounters:  02/05/21 187 lb (84.8 kg)  11/06/20 173 lb (78.5 kg)  10/28/20 171 lb (77.6 kg)    Physical Exam Vitals and nursing note reviewed.  Constitutional:      General: He is in acute distress.     Appearance: He is ill-appearing. He is not toxic-appearing or diaphoretic.  HENT:     Head: Normocephalic and atraumatic.     Right Ear: Decreased hearing noted.     Left Ear: Decreased hearing noted.     Mouth/Throat:     Lips: Pink.     Mouth: Mucous membranes are moist.  Eyes:     Conjunctiva/sclera: Conjunctivae normal.     Pupils: Pupils are equal, round, and reactive to light.  Neck:     Thyroid: No thyroid mass, thyromegaly or thyroid tenderness.     Vascular: Normal carotid pulses. Carotid bruit and JVD present. No hepatojugular reflux.     Trachea: Trachea and phonation normal.  Cardiovascular:     Rate and Rhythm: Tachycardia present. Rhythm irregularly irregular. No  extrasystoles are present.    Heart sounds: Murmur heard.  Systolic murmur is present with a grade of 2/6.  Pulmonary:     Effort: Pulmonary effort is normal.     Breath sounds: Normal breath sounds.  Abdominal:     General: Bowel sounds are normal. There is distension. There is no abdominal bruit.     Palpations: There is no mass.     Tenderness: There is no guarding or rebound.  Musculoskeletal:     Cervical back: Neck supple.     Right lower leg: No edema.     Left lower leg: No edema.  Lymphadenopathy:     Cervical: No cervical adenopathy.     Right cervical: No superficial, deep or posterior cervical adenopathy.    Left cervical: No superficial, deep or posterior cervical adenopathy.  Skin:    General: Skin is warm and dry.     Capillary Refill: Capillary refill takes less than 2 seconds.  Neurological:     Mental Status: He is alert. He is confused.     Cranial Nerves: Facial asymmetry (minimal left facial droop) present.     Sensory: No sensory deficit.     Motor: Weakness (generalized) present.     Gait: Gait abnormal (unsteady).     Deep Tendon Reflexes: Reflexes normal.    Results for orders placed or performed in visit on 11/06/20  [facility]  Result Value Ref Range   Glucose 130 (H) 70 - 99 mg/dL   BUN 18 8 - 27 mg/dL   Creatinine, Ser 1.02 0.76 - 1.27 mg/dL   eGFR 71 >59 mL/min/1.73   BUN/Creatinine Ratio 18 10 - 24   Sodium 124 (L) 134 - 144 mmol/L   Potassium 4.6 3.5 - 5.2 mmol/L   Chloride 87 (L) 96 - 106 mmol/L   CO2 22 20 - 29 mmol/L   Calcium 8.7 8.6 - 10.2 mg/dL  CoaguChek XS/INR Waived  Result Value Ref Range   INR 2.1 (H) 0.9 - 1.1   Prothrombin Time 25.1 sec     EKG in office: A-Fib RVR, 122, QT 314 ms, no acute ST-T changes, NSST changes. Monia Pouch, FNP-C  Pertinent labs & imaging results  that were available during my care of the patient were reviewed by me and considered in my medical decision making.  Assessment & Plan:  Danny [name] was  seen today for hypertension.  Diagnoses and all orders for this visit:  Atrial fibrillation with RVR ([place]) Hypertensive emergency Long term (current) use of anticoagulants Generalized abdominal pain Abdominal distension Pt in A-Fib RVR with hypertensive emergency in office. New onset confusion this morning per caregiver, abdominal pain with distention, on coumadin. Pt to ED for evaluation and treatment, went via [facility]. Report called to Jinny Blossom, RN, charge nurse [facility] ED. -     EKG 12-Lead     Continue all other maintenance medications.  Follow up plan: To ED via EMS for evaluation and treatment  Monia Pouch, [place] [phone]

## 2021-02-05 NOTE — ED Notes (Signed)
Hospitalist notified of trending bp

## 2021-02-05 NOTE — ED Provider Notes (Signed)
[facility] Provider Note   CSN: [id number] Arrival date & time: 02/05/21  1327     History  No chief complaint on file.   Danny Reichel. is a 88 y.o. male who  has a past medical history of Anxiety, Atrial fibrillation ([place]), CAD, ARTERY BYPASS GRAFT (05/19/2008), Enteritis due to Clostridium difficile, history of (11/21/2012), GERD (gastroesophageal reflux disease), Hyperlipidemia, Hypertension, Internal carotid artery stenosis, Shingles (06/2020), 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 (01/14/2014), and Vitamin D deficiency. Patient arrives via EMS for hypertension and tachycardia.  Patient was at his doctor's office today and was felt to be both hypertensive and tachycardic.  He was in A. fib with RVR.  I collected history from the paramedics at bedside.. Patient states that he feels fine and does not know why he is here.  He does report that his wife has been sick recently.  Additional history collected from patient's niece who states that the patient has been more confused and his normal baseline is independent and completely oriented.  Patient is on Coumadin for paroxysmal atrial fibrillation.  He is unsure if he took his medications today  HPI     Home Medications Prior to Admission medications   Medication Sig Start Date End Date Taking? Authorizing Provider  amiodarone (PACERONE) 200 MG tablet TAKE 1/2 TABLET EVERY DAY (NEED MD APPOINTMENT) Patient taking differently: Take 100 mg by mouth daily. 10/27/20  Yes Josue Hector, MD  cholecalciferol (VITAMIN D) 25 MCG (1000 UNIT) tablet Take 2,000 Units by mouth daily with breakfast.   Yes [provider]  furosemide (LASIX) 20 MG tablet TAKE 1 TABLET EVERY DAY Patient taking differently: Take 20 mg by mouth daily. 12/15/20  Yes [name] F, FNP  levocetirizine (XYZAL) 5 MG tablet Take 5 mg by mouth daily as needed. 01/14/21  Yes [provider]  losartan (COZAAR) 50 MG  tablet Take 1 tablet (50 mg total) by mouth every morning. 10/28/20  Yes [name] F, FNP  lovastatin (MEVACOR) 40 MG tablet Take 1 tablet (40 mg total) by mouth at bedtime. 05/06/20  Yes Loman Brooklyn, FNP  metoprolol tartrate (LOPRESSOR) 50 MG tablet Take 1 tablet (50 mg total) by mouth 2 (two) times daily. 11/18/20  Yes Josue Hector, MD  Multiple Vitamin (MULTIVITAMIN WITH MINERALS) TABS tablet Take 1 tablet by mouth daily. One-A-Day for Men 50+   Yes [provider]  Multiple Vitamins-Minerals (PRESERVISION AREDS 2 PO) Take 1 tablet by mouth in the morning and at bedtime.   Yes [provider]  nitroGLYCERIN (NITROSTAT) 0.4 MG SL tablet PLACE 1 TABLET (0.4 MG TOTAL) UNDER THE TONGUE EVERY 5 (FIVE) MINUTES AS NEEDED FOR CHEST PAIN. 01/13/20  Yes Josue Hector, MD  Omega-3 Fatty Acids (FISH OIL) 1200 MG CAPS Take 2,400 mg by mouth daily.   Yes [provider]  omeprazole (PRILOSEC) 20 MG capsule Take 1 capsule (20 mg total) by mouth 2 (two) times daily before a meal. 10/30/20  Yes [name] F, FNP  Probiotic Product ([place]) Take 1 capsule by mouth daily as needed.   Yes [provider]  warfarin (COUMADIN) 5 MG tablet TAKE 1 TABLET EVERY DAY 12/02/20  Yes Loman Brooklyn, FNP  traMADol (ULTRAM) 50 MG tablet Take 0.5-1 tablets (25-50 mg total) by mouth 2 (two) times daily as needed. Patient not taking: Reported on 02/05/2021 10/28/20   Loman Brooklyn, FNP  Allergies    Doxycycline, Contrast media [iodinated contrast media], Lunesta [eszopiclone], Morphine and related, and Trazodone and nefazodone    Review of Systems   Review of Systems As per hpi Physical Exam Updated Vital Signs BP 101/78    Pulse 69    Temp (!) 97.5 F (36.4 C) (Oral)    Resp 18    SpO2 98%  Physical Exam Vitals and nursing note reviewed.  Constitutional:      General: He is not in acute distress.    Appearance: He is well-developed. He is not  diaphoretic.  HENT:     Head: Normocephalic and atraumatic.  Eyes:     General: No scleral icterus.    Extraocular Movements: Extraocular movements intact.     Conjunctiva/sclera: Conjunctivae normal.     Pupils: Pupils are equal, round, and reactive to light.  Cardiovascular:     Rate and Rhythm: Tachycardia present. Rhythm irregular.     Heart sounds: Normal heart sounds.  Pulmonary:     Effort: Pulmonary effort is normal. Tachypnea present. No respiratory distress.     Breath sounds: Normal breath sounds and air entry.  Abdominal:     Palpations: Abdomen is soft.     Tenderness: There is no abdominal tenderness.  Musculoskeletal:     Cervical back: Normal range of motion and neck supple.  Skin:    General: Skin is warm and dry.  Neurological:     Mental Status: He is alert.  Psychiatric:        Behavior: Behavior normal.    ED Results / Procedures / Treatments   Labs (all labs ordered are listed, but only abnormal results are displayed) Labs Reviewed  BASIC METABOLIC PANEL - Abnormal; Notable for the following components:      Result Value   Sodium 120 (*)    Chloride 87 (*)    CO2 21 (*)    Glucose, Bld 111 (*)    BUN 28 (*)    Creatinine, Ser 1.27 (*)    GFR, Estimated 55 (*)    All other components within normal limits  PROTIME-INR - Abnormal; Notable for the following components:   Prothrombin Time 57.2 (*)    INR 6.5 (*)    All other components within normal limits  BLOOD GAS, VENOUS - Abnormal; Notable for the following components:   pCO2, Ven 34.3 (*)    pO2, Ven <31.0 (*)    Acid-base deficit 2.6 (*)    All other components within normal limits  COMPREHENSIVE METABOLIC PANEL - Abnormal; Notable for the following components:   Sodium 120 (*)    Chloride 86 (*)    CO2 20 (*)    Glucose, Bld 111 (*)    BUN 27 (*)    AST 51 (*)    Alkaline Phosphatase 198 (*)    Total Bilirubin 1.4 (*)    GFR, Estimated 57 (*)    All other components within normal  limits  LACTIC ACID, PLASMA - Abnormal; Notable for the following components:   Lactic Acid, Venous 2.1 (*)    All other components within normal limits  SODIUM - Abnormal; Notable for the following components:   Sodium 120 (*)    All other components within normal limits  SODIUM - Abnormal; Notable for the following components:   Sodium 123 (*)    All other components within normal limits  CBG MONITORING, ED - Abnormal; Notable for the following components:   Glucose-Capillary 104 (*)  All other components within normal limits  TROPONIN I (HIGH SENSITIVITY) - Abnormal; Notable for the following components:   Troponin I (High Sensitivity) 22 (*)    All other components within normal limits  TROPONIN I (HIGH SENSITIVITY) - Abnormal; Notable for the following components:   Troponin I (High Sensitivity) 22 (*)    All other components within normal limits  RESP PANEL BY RT-PCR (FLU A&B, COVID) ARPGX2  CULTURE, BLOOD (ROUTINE X 2)  CULTURE, BLOOD (ROUTINE X 2)  MAGNESIUM  CBC  TSH  AMMONIA  ETHANOL  RAPID URINE DRUG SCREEN, HOSP PERFORMED  OSMOLALITY  OSMOLALITY, URINE  SODIUM, URINE, RANDOM    EKG None  Radiology CT HEAD WO CONTRAST  Result Date: 02/05/2021 CLINICAL DATA:  Mental status change, unknown cause EXAM: CT HEAD WITHOUT CONTRAST TECHNIQUE: Contiguous axial images were obtained from the base of the skull through the vertex without intravenous contrast. RADIATION DOSE REDUCTION: This exam was performed according to the departmental dose-optimization program which includes automated exposure control, adjustment of the mA and/or kV according to patient size and/or use of iterative reconstruction technique. COMPARISON:  None. FINDINGS: Brain: No evidence of acute intracranial hemorrhage or extra-axial collection.No evidence of mass lesion/concern mass effect.The ventricles are normal in size.Scattered subcortical and periventricular white matter hypodensities, nonspecific  but likely sequela of chronic small vessel ischemic disease. Vascular: No hyperdense vessel or unexpected calcification. Skull: Normal. Negative for fracture or focal lesion. Sinuses/Orbits: There is mucosal thickening of the left maxillary sinus with frothy material. The orbits are unremarkable. Other: None IMPRESSION: No acute intracranial abnormality. Mild sequela of chronic small vessel ischemic disease. Electronically Signed   By: Maurine Simmering M.D.   On: 02/05/2021 15:12   DG Chest Port 1 View  Result Date: 02/05/2021 CLINICAL DATA:  Atrial fibrillation with rapid ventricular response EXAM: PORTABLE CHEST 1 VIEW COMPARISON:  Portable exam 1343 hours compared to 04/07/2016 FINDINGS: Enlargement of cardiac silhouette post CABG. Mild pulmonary vascular congestion. Atherosclerotic calcification aorta. RIGHT pleural effusion and basilar atelectasis. Remaining lungs clear. No definite infiltrate or pneumothorax. Bones demineralized. IMPRESSION: Enlargement of cardiac silhouette with pulmonary vascular congestion post CABG. New RIGHT pleural effusion and basilar atelectasis of unknown etiology; follow-up radiographs until resolution recommended to exclude underlying abnormalities. Aortic Atherosclerosis (ICD10-I70.0). Electronically Signed   By: Lavonia Dana M.D.   On: 02/05/2021 13:57    Procedures Procedures    Medications Ordered in ED Medications  0.9 %  sodium chloride infusion ( Intravenous New Bag/Given 02/05/21 1734)  diltiazem (CARDIZEM) injection 10 mg (10 mg Intravenous Given 02/05/21 1352)  phytonadione (VITAMIN K) tablet 2.5 mg (2.5 mg Oral Given 02/05/21 1618)    ED Course/ Medical Decision Making/ A&P Clinical Course as of 02/05/21 1920  Fri Feb 05, 2021  1459 INR(!!): 6.5 Supratherapeutic INR - reversal with vit K ordered [AH]  1459 Sodium(!): 120 Sodium is very low [AH]  1459 pO2, Ven(!!): <31.0 [AH]    Clinical Course User Index [AH] Margarita Mail, PA-C       [id number]  Score: 4                    Medical Decision Making  This patient presents to the ED for concern of confusion, this involves an extensive number of treatment options, and is a complaint that carries with it a high risk of complications and morbidity.  The differential diagnosis includes The differential diagnosis for AMS is extensive and includes, but is not limited to:  drug overdose - opioids, alcohol, sedatives, antipsychotics, drug withdrawal, others; Metabolic: hypoxia, hypoglycemia, hyperglycemia, hypercalcemia, hypernatremia, hyponatremia, uremia, hepatic encephalopathy, hypothyroidism, hyperthyroidism, vitamin B12 or thiamine deficiency, carbon monoxide poisoning, Wilson's disease, Lactic acidosis,DKA/HHOS; Infectious: meningitis, encephalitis, bacteremia/sepsis, urinary tract infection, pneumonia, neurosyphilis; Structural: Space-occupying lesion, (brain tumor, subdural hematoma, hydrocephalus,); Vascular: stroke, subarachnoid hemorrhage, coronary ischemia, hypertensive encephalopathy, CNS vasculitis, thrombotic thrombocytopenic purpura, disseminated intravascular coagulation, hyperviscosity; Psychiatric: Schizophrenia, depression; Other: Seizure, hypothermia, heat stroke, ICU psychosis, dementia -"sundowning."    Co morbidities that complicate the patient evaluation  Hypertension, hyperlipidemia, atrial fibrillation, CAD   Additional history obtained:  Additional history obtained from patient's niece at bedside    Lab Tests:  I Ordered, and personally interpreted labs.  The pertinent results include:   CBC without abnormality, normal magnesium level, INR supratherapeutic at 6.5, BMP shows sodium of 120, creatinine elevated above baseline representing AKI, VBG shows low oxygen level   Imaging Studies ordered:  I ordered imaging studies including CT head and 1 view chest x-ray I independently visualized and interpreted imaging which showed \\CT  head showed no abnormalities, EKG  shows some pulmonary vascular congestion I agree with the radiologist interpretation   Cardiac Monitoring:  The patient was maintained on a cardiac monitor.  I personally viewed and interpreted the cardiac monitored which showed an underlying rhythm of: A. fib with RVR at a rate of 128   Medicines ordered and prescription drug management:  I ordered medication including vitamin K for supratherapeutic INR, and IV diltiazem for rate control with improvement on reevaluation  Test Considered:     Critical Interventions:  Oxygen via nasal cannula at 2 L   Consultations Obtained:  I requested consultation with the hospitalist,  and discussed lab and imaging findings as well as pertinent plan - they recommend: Admission      Reevaluation:  After the interventions noted above, I reevaluated the patient and found that they have :improved    Dispostion:  After consideration of the diagnostic results and the patients response to treatment, I feel that the patent would benefit from admission.  Final Clinical Impression(s) / ED Diagnoses Final diagnoses:  Hyponatremia  Confusion  Atrial fibrillation with RVR [facility])    Rx / DC Orders ED Discharge Orders     None         Margarita Mail, PA-C 02/05/21 Luanna Cole, MD 02/06/21 [phone]

## 2021-02-05 NOTE — ED Notes (Signed)
Attempted report x1. 

## 2021-02-06 ENCOUNTER — Encounter (HOSPITAL_COMMUNITY): Payer: Self-pay | Admitting: Family Medicine

## 2021-02-06 ENCOUNTER — Other Ambulatory Visit: Payer: Self-pay

## 2021-02-06 ENCOUNTER — Inpatient Hospital Stay (HOSPITAL_COMMUNITY): Payer: Medicare HMO

## 2021-02-06 DIAGNOSIS — E871 Hypo-osmolality and hyponatremia: Principal | ICD-10-CM

## 2021-02-06 DIAGNOSIS — I4891 Unspecified atrial fibrillation: Secondary | ICD-10-CM | POA: Diagnosis not present

## 2021-02-06 DIAGNOSIS — G9341 Metabolic encephalopathy: Secondary | ICD-10-CM

## 2021-02-06 DIAGNOSIS — I10 Essential (primary) hypertension: Secondary | ICD-10-CM

## 2021-02-06 LAB — VITAMIN B12: Vitamin B-12: 1008 pg/mL — ABNORMAL HIGH (ref 180–914)

## 2021-02-06 LAB — ECHOCARDIOGRAM COMPLETE
AR max vel: 1.3 cm2
AV Area VTI: 0.94 cm2
AV Area mean vel: 1.16 cm2
AV Mean grad: 25 mmHg
AV Peak grad: 39.1 mmHg
Ao pk vel: 3.13 m/s
Area-P 1/2: 3.91 cm2
Calc EF: 44.1 %
Height: 74 in
MV VTI: 3.17 cm2
S' Lateral: 3.8 cm
Single Plane A2C EF: 40.4 %
Single Plane A4C EF: 44 %
Weight: 2938.29 oz

## 2021-02-06 LAB — URINALYSIS, COMPLETE (UACMP) WITH MICROSCOPIC
Bacteria, UA: NONE SEEN
Bilirubin Urine: NEGATIVE
Glucose, UA: NEGATIVE mg/dL
Hgb urine dipstick: NEGATIVE
Ketones, ur: NEGATIVE mg/dL
Leukocytes,Ua: NEGATIVE
Nitrite: NEGATIVE
Protein, ur: NEGATIVE mg/dL
Specific Gravity, Urine: 1.02 (ref 1.005–1.030)
pH: 5.5 (ref 5.0–8.0)

## 2021-02-06 LAB — CBC
HCT: 37.7 % — ABNORMAL LOW (ref 39.0–52.0)
Hemoglobin: 13.7 g/dL (ref 13.0–17.0)
MCH: 32.8 pg (ref 26.0–34.0)
MCHC: 36.3 g/dL — ABNORMAL HIGH (ref 30.0–36.0)
MCV: 90.2 fL (ref 80.0–100.0)
Platelets: 186 10*3/uL (ref 150–400)
RBC: 4.18 MIL/uL — ABNORMAL LOW (ref 4.22–5.81)
RDW: 15.3 % (ref 11.5–15.5)
WBC: 8.8 10*3/uL (ref 4.0–10.5)
nRBC: 0 % (ref 0.0–0.2)

## 2021-02-06 LAB — MAGNESIUM
Magnesium: 1.8 mg/dL (ref 1.7–2.4)
Magnesium: 1.8 mg/dL (ref 1.7–2.4)

## 2021-02-06 LAB — PROTIME-INR
INR: 4.8 (ref 0.8–1.2)
INR: 4.2 (ref 0.8–1.2)
Prothrombin Time: 44.6 seconds — ABNORMAL HIGH (ref 11.4–15.2)
Prothrombin Time: 40.5 seconds — ABNORMAL HIGH (ref 11.4–15.2)

## 2021-02-06 LAB — BASIC METABOLIC PANEL
Anion gap: 11 (ref 5–15)
BUN: 21 mg/dL (ref 8–23)
CO2: 22 mmol/L (ref 22–32)
Calcium: 8.9 mg/dL (ref 8.9–10.3)
Chloride: 93 mmol/L — ABNORMAL LOW (ref 98–111)
Creatinine, Ser: 0.99 mg/dL (ref 0.61–1.24)
GFR, Estimated: >60 mL/min (ref >60)
Glucose, Bld: 99 mg/dL (ref 70–99)
Potassium: 3.9 mmol/L (ref 3.5–5.1)
Sodium: 126 mmol/L — ABNORMAL LOW (ref 135–145)

## 2021-02-06 LAB — OSMOLALITY: Osmolality: 268 mOsm/kg — ABNORMAL LOW (ref 275–295)

## 2021-02-06 LAB — SODIUM, URINE, RANDOM: Sodium, Ur: 59 mmol/L

## 2021-02-06 LAB — MRSA NEXT GEN BY PCR, NASAL: MRSA by PCR Next Gen: NOT DETECTED

## 2021-02-06 LAB — PROCALCITONIN: Procalcitonin: <0.1 ng/mL

## 2021-02-06 LAB — FOLATE: Folate: 14.2 ng/mL (ref >5.9)

## 2021-02-06 LAB — OSMOLALITY, URINE: Osmolality, Ur: 468 mOsm/kg (ref 300–900)

## 2021-02-06 LAB — BRAIN NATRIURETIC PEPTIDE: B Natriuretic Peptide: 491 pg/mL — ABNORMAL HIGH (ref 0.0–100.0)

## 2021-02-06 LAB — LIPASE, BLOOD: Lipase: 36 U/L (ref 11–51)

## 2021-02-06 LAB — CREATININE, URINE, RANDOM: Creatinine, Urine: 49.71 mg/dL

## 2021-02-06 MED ORDER — MELATONIN 3 MG PO TABS
3.0000 mg | ORAL_TABLET | Freq: Every day | ORAL | Status: DC
  Administered 2021-02-06 – 2021-02-09 (×4): 3 mg via ORAL
  Filled 2021-02-06 (×4): qty 1

## 2021-02-06 MED ORDER — METOPROLOL TARTRATE 5 MG/5ML IV SOLN
5.0000 mg | Freq: Four times a day (QID) | INTRAVENOUS | Status: DC
  Administered 2021-02-06 – 2021-02-08 (×9): 5 mg via INTRAVENOUS
  Filled 2021-02-06 (×9): qty 5

## 2021-02-06 MED ORDER — PERFLUTREN LIPID MICROSPHERE
1.0000 mL | INTRAVENOUS | Status: AC | PRN
  Administered 2021-02-06: 2 mL via INTRAVENOUS
  Filled 2021-02-06: qty 10

## 2021-02-06 MED ORDER — METOPROLOL TARTRATE 5 MG/5ML IV SOLN
2.5000 mg | Freq: Once | INTRAVENOUS | Status: AC
Start: 2021-02-06 — End: 2021-02-06
  Administered 2021-02-06: 2.5 mg via INTRAVENOUS
  Filled 2021-02-06: qty 5

## 2021-02-06 MED ORDER — ACETAMINOPHEN 325 MG PO TABS
650.0000 mg | ORAL_TABLET | Freq: Four times a day (QID) | ORAL | Status: DC | PRN
  Administered 2021-02-07 – 2021-02-09 (×6): 650 mg via ORAL
  Filled 2021-02-06 (×6): qty 2

## 2021-02-06 NOTE — Progress Notes (Signed)
Patient was found sitting on the floor. He stated he "slipped off the commode". Patient was completely alert and oriented and mentating at previous baseline. Denied hitting head or any injury. Assisted back to commode with the assistance of another RN.  MD notified and promptly came to bedside to assess. No apparent injury to patient. Pt continues to deny pain.  Skin intact, neurological assessment at previous baseline. Pt assisted back to bed, reminded to call for assistance. Bed alarm activated. Floor pads remain in place.

## 2021-02-06 NOTE — Plan of Care (Signed)

## 2021-02-06 NOTE — TOC Progression Note (Signed)
°  Transition of Care [facility]) Screening Note   Patient Details  Name: Danny [name]. Date of Birth: Aug 10, 1931   Transition of Care [facility]) CM/SW Contact:    Shade Flood, LCSW Phone Number: 02/06/2021, 10:36 AM    Transition of Care Department [facility]) has reviewed patient and no TOC needs have been identified at this time. We will continue to monitor patient advancement through interdisciplinary progression rounds. If new patient transition needs arise, please place a TOC consult.

## 2021-02-06 NOTE — Progress Notes (Signed)
Responded to nursing call:  slid off commode   Subjective: He is pleasantly confused.  Denies cp, sob.  Denies hitting head.  Denies headache or neck pain.  Denies buttock or hip pain  Vitals:   02/06/21 1200 02/06/21 1215 02/06/21 1300 02/06/21 1400  BP:  93/67 100/82 133/87  Pulse: 100 98 100 (!) 103  Resp: 19 18 (!) 23 (!) 22  Temp:      TempSrc:      SpO2: 98% 97% 97% 98%  Weight:      Height:       CV--RRR Lung--bibasilar rales Abd--soft+BS/NT Ext--no visible wounds.  No hip pain on palpation   Assessment/Plan:  Fall -monitor clinically -fall precautions -clinically no injury    Orson Eva, DO [facility]

## 2021-02-06 NOTE — Progress Notes (Signed)
PROGRESS NOTE  Danny [name]. NLZ:[id number] DOB: 1931/04/26 DOA: 02/05/2021 PCP: Loman Brooklyn, FNP  Brief History:  88 year old male with a history of peripheral vascular disease, left CEA, coronary disease status post CABG 2003, hypertension, hyperlipidemia, persistent atrial fibrillation on warfarin, anxiety presenting with altered mental status, generalized weakness, dyspnea, and poor p.o. intake.  The patient is a poor historian.  History is supplemented from the patient's spouse and niece.  Apparently, the patient has had increasing shortness of breath and dyspnea on exertion for the better part of a month which has worsened over the past week.  In addition, the patient has had increasing confusion for the past month which has significantly worsened over the past week prior to admission.  He has had decreased oral intake and increasing generalized weakness to the point where he was having difficulty getting out of bed for 2 days prior to this admission.  There was no particular complaints of fevers, chills, chest pain, vomiting, diarrhea.  Patient has been nauseous and has had decreased oral intake.  Complains of some constipation and abdominal discomfort which appears to be chronic. Has not been any changes in his medications for at least a year.  The patient has been resistant to follow-up with his medical providers.  In the ED, the patient was afebrile and hemodynamically stable.  Patient was in atrial fibrillation with RVR with heart rates 130-150.  Oxygen saturation was 90% on 2 L.  BMP shows sodium 120, potassium 4.2, bicarbonate 20, serum creatinine 1.27.  WBC 7.9, hemoglobin 14.4, platelets 188,000.  AST 51, ALT 34, alk phosphatase 198, total bilirubin 1.4.  Assessment/Plan:  Hyponatremia -Likely multifactorial but primarily driven by poor solute intake and volume depletion -There is a component of chronicity with sodium ranging 125-130 -Urine sodium -Urine  creatinine -Urine osmolarity -Serum osmolarity--264  Persistent atrial fibrillation with RVR -Changed IV Lopressor 5 mg IV every 6 -Continue amiodarone -The patient has refused ablation in the past -Presented with supratherapeutic INR 6.5 -Echocardiogram  Supratherapeutic INR -No signs of bleeding -Given vitamin K 02/05/2021 -Repeat INR  Acute metabolic encephalopathy -Obtain UA -T02 -Folic acid -TSH 4.097 -CT brain negative for acute findings  Elevated lactate -Blood cultures x2 sets -Check PCT -UA and urine culture  Lower extremity pain and edema -Venous duplex  Coronary artery disease -No chest pain presently -Troponin 22>> 22 -Personally reviewed EKG--atrial fibrillation, nonspecific ST-T wave change  Abdominal pain -CT abd/pelv  Goals of Care -Advance care planning, including the explanation and discussion of advance directives was carried out with the patient and family.  Code status including explanations of "Full Code" and "DNR" and alternatives were discussed in detail.  Discussion of end-of-life issues including but not limited palliative care, hospice care and the concept of hospice, other end-of-life care options, power of attorney for health care decisions, living wills, and physician orders for life-sustaining treatment were also discussed with the patient and family.  Total face to face time 16 minutes. -confirmed DNR with spouse and niece          Family Communication:  niece and spouse updated 1/14  Consultants:  none  Code Status:  DNR  DVT Prophylaxis:  coumadin   Procedures: As Listed in Progress Note Above  Antibiotics: None        Subjective: Patient denies fevers, chills, headache, chest pain, dyspnea, nausea, vomiting, diarrhea, dysuria Complains of abd pain--diffuse   Objective: Vitals:  02/06/21 0500 02/06/21 0700 02/06/21 0753 02/06/21 0800  BP:  (!) 128/95  97/71  Pulse:  (!) 117 (!) 140 77  Resp:  (!) 23  (!) 22 16  Temp:   97.6 F (36.4 C)   TempSrc:   Oral   SpO2:  99% 100% 90%  Weight: 83.3 kg     Height:        Intake/Output Summary (Last 24 hours) at 02/06/2021 0830 Last data filed at 02/06/2021 0534 Gross per 24 hour  Intake 1318.78 ml  Output 450 ml  Net 868.78 ml   Weight change:  Exam:  General:  Pt is alert, follows commands appropriately, not in acute distress HEENT: No icterus, No thrush, No neck mass, [place]/AT Cardiovascular: IRRR, S1/S2, no rubs, no gallops Respiratory: right basilar rales.   Left CTA Abdomen: Soft/+BS, non tender, non distended, no guarding Extremities: No edema, No lymphangitis, No petechiae, No rashes, no synovitis   Data Reviewed: I have personally reviewed following labs and imaging studies Basic Metabolic Panel: Recent Labs  Lab 02/05/21 1353 02/05/21 1435 02/05/21 1618 02/05/21 1819 02/06/21 0525  NA 120* 120* 120* 123* 126*  K 4.6 4.2  --   --  3.9  CL 87* 86*  --   --  93*  CO2 21* 20*  --   --  22  GLUCOSE 111* 111*  --   --  99  BUN 28* 27*  --   --  21  CREATININE 1.27* 1.22  --   --  0.99  CALCIUM 8.9 9.1  --   --  8.9  MG 1.9  --   --   --  1.8   Liver Function Tests: Recent Labs  Lab 02/05/21 1435  AST 51*  ALT 34  ALKPHOS 198*  BILITOT 1.4*  PROT 7.7  ALBUMIN 4.1   No results for input(s): LIPASE, AMYLASE in the last 168 hours. Recent Labs  Lab 02/05/21 1435  AMMONIA 22   Coagulation Profile: Recent Labs  Lab 02/05/21 1353  INR 6.5*   CBC: Recent Labs  Lab 02/05/21 1353 02/06/21 0525  WBC 7.9 8.8  HGB 14.4 13.7  HCT 40.0 37.7*  MCV 89.3 90.2  PLT 188 186   Cardiac Enzymes: No results for input(s): CKTOTAL, CKMB, CKMBINDEX, TROPONINI in the last 168 hours. BNP: Invalid input(s): POCBNP CBG: Recent Labs  Lab 02/05/21 1338  GLUCAP 104*   HbA1C: No results for input(s): HGBA1C in the last 72 hours. Urine analysis:    Component Value Date/Time   COLORURINE YELLOW 09/19/2012 2034    APPEARANCEUR Clear 05/05/2017 1346   LABSPEC 1.010 09/19/2012 2034   PHURINE 5.5 09/19/2012 2034   GLUCOSEU Negative 05/05/2017 1346   HGBUR NEGATIVE 09/19/2012 2034   BILIRUBINUR Negative 05/05/2017 1346   KETONESUR TRACE (A) 09/19/2012 2034   PROTEINUR Negative 05/05/2017 1346   PROTEINUR NEGATIVE 09/19/2012 2034   UROBILINOGEN negative 02/11/2015 1142   UROBILINOGEN 0.2 09/19/2012 2034   NITRITE Negative 05/05/2017 1346   NITRITE NEGATIVE 09/19/2012 2034   LEUKOCYTESUR Negative 05/05/2017 1346   Sepsis Labs: @LABRCNTIP (procalcitonin:4,lacticidven:4) ) Recent Results (from the past 240 hour(s))  Resp Panel by RT-PCR (Flu A&B, Covid) Nasopharyngeal Swab     Status: None   Collection Time: 02/05/21  2:09 PM   Specimen: Nasopharyngeal Swab; Nasopharyngeal(NP) swabs in vial transport medium  Result Value Ref Range Status   SARS Coronavirus 2 by RT PCR NEGATIVE NEGATIVE Final    Comment: (NOTE) SARS-CoV-2 target nucleic  acids are NOT DETECTED.  The SARS-CoV-2 RNA is generally detectable in upper respiratory specimens during the acute phase of infection. The lowest concentration of SARS-CoV-2 viral copies this assay can detect is 138 copies/mL. A negative result does not preclude SARS-Cov-2 infection and should not be used as the sole basis for treatment or other patient management decisions. A negative result may occur with  improper specimen collection/handling, submission of specimen other than nasopharyngeal swab, presence of viral mutation(s) within the areas targeted by this assay, and inadequate number of viral copies(<138 copies/mL). A negative result must be combined with clinical observations, patient history, and epidemiological information. The expected result is Negative.  Fact Sheet for Patients:  EntrepreneurPulse.com.au  Fact Sheet for Healthcare Providers:  IncredibleEmployment.be  This test is no t yet approved or cleared  by the [place] FDA and  has been authorized for detection and/or diagnosis of SARS-CoV-2 by FDA under an Emergency Use Authorization (EUA). This EUA will remain  in effect (meaning this test can be used) for the duration of the COVID-19 declaration under Section 564(b)(1) of the Act, 21 U.S.C.section 360bbb-3(b)(1), unless the authorization is terminated  or revoked sooner.       Influenza A by PCR NEGATIVE NEGATIVE Final   Influenza B by PCR NEGATIVE NEGATIVE Final    Comment: (NOTE) The Xpert Xpress SARS-CoV-2/FLU/RSV plus assay is intended as an aid in the diagnosis of influenza from Nasopharyngeal swab specimens and should not be used as a sole basis for treatment. Nasal washings and aspirates are unacceptable for Xpert Xpress SARS-CoV-2/FLU/RSV testing.  Fact Sheet for Patients: EntrepreneurPulse.com.au  Fact Sheet for Healthcare Providers: IncredibleEmployment.be  This test is not yet approved or cleared by the [place] FDA and has been authorized for detection and/or diagnosis of SARS-CoV-2 by FDA under an Emergency Use Authorization (EUA). This EUA will remain in effect (meaning this test can be used) for the duration of the COVID-19 declaration under Section 564(b)(1) of the Act, 21 U.S.C. section 360bbb-3(b)(1), unless the authorization is terminated or revoked.  Performed at [facility], [address] [place], [place] [address]   Blood Cultures (routine x 2)     Status: None (Preliminary result)   Collection Time: 02/05/21  2:35 PM   Specimen: BLOOD RIGHT ARM  Result Value Ref Range Status   Specimen Description   Final    BLOOD RIGHT ARM BOTTLES DRAWN AEROBIC AND ANAEROBIC   Special Requests Blood Culture adequate volume  Final   Culture   Final    NO GROWTH < 24 HOURS Performed at [facility], [address] [place], [place] [address]    Report Status PENDING  Incomplete  Blood Cultures (routine x 2)      Status: None (Preliminary result)   Collection Time: 02/05/21  2:35 PM   Specimen: BLOOD LEFT ARM  Result Value Ref Range Status   Specimen Description BLOOD LEFT ARM BOTTLES DRAWN AEROBIC AND ANAEROBIC  Final   Special Requests Blood Culture adequate volume  Final   Culture   Final    NO GROWTH < 24 HOURS Performed at [facility], [address], [place], [place] [address]    Report Status PENDING  Incomplete     Scheduled Meds:  amiodarone  100 mg Oral Daily   Chlorhexidine Gluconate Cloth  6 each Topical Daily   furosemide  20 mg Oral Daily   metoprolol tartrate  5 mg Intravenous Q6H   pantoprazole  40 mg Oral Daily  pravastatin  40 mg Oral q1800   Continuous Infusions:  sodium chloride 100 mL/hr at 02/06/21 0534    Procedures/Studies: CT HEAD WO CONTRAST  Result Date: 02/05/2021 CLINICAL DATA:  Mental status change, unknown cause EXAM: CT HEAD WITHOUT CONTRAST TECHNIQUE: Contiguous axial images were obtained from the base of the skull through the vertex without intravenous contrast. RADIATION DOSE REDUCTION: This exam was performed according to the departmental dose-optimization program which includes automated exposure control, adjustment of the mA and/or kV according to patient size and/or use of iterative reconstruction technique. COMPARISON:  None. FINDINGS: Brain: No evidence of acute intracranial hemorrhage or extra-axial collection.No evidence of mass lesion/concern mass effect.The ventricles are normal in size.Scattered subcortical and periventricular white matter hypodensities, nonspecific but likely sequela of chronic small vessel ischemic disease. Vascular: No hyperdense vessel or unexpected calcification. Skull: Normal. Negative for fracture or focal lesion. Sinuses/Orbits: There is mucosal thickening of the left maxillary sinus with frothy material. The orbits are unremarkable. Other: None IMPRESSION: No acute intracranial abnormality. Mild sequela of chronic small  vessel ischemic disease. Electronically Signed   By: Maurine Simmering M.D.   On: 02/05/2021 15:12   DG Chest Port 1 View  Result Date: 02/05/2021 CLINICAL DATA:  Atrial fibrillation with rapid ventricular response EXAM: PORTABLE CHEST 1 VIEW COMPARISON:  Portable exam 1343 hours compared to 04/07/2016 FINDINGS: Enlargement of cardiac silhouette post CABG. Mild pulmonary vascular congestion. Atherosclerotic calcification aorta. RIGHT pleural effusion and basilar atelectasis. Remaining lungs clear. No definite infiltrate or pneumothorax. Bones demineralized. IMPRESSION: Enlargement of cardiac silhouette with pulmonary vascular congestion post CABG. New RIGHT pleural effusion and basilar atelectasis of unknown etiology; follow-up radiographs until resolution recommended to exclude underlying abnormalities. Aortic Atherosclerosis (ICD10-I70.0). Electronically Signed   By: Lavonia Dana M.D.   On: 02/05/2021 13:57    Orson Eva, DO  [facility]  If 7PM-7AM, please contact night-coverage [email or web address] Password [email or web address] 02/06/2021, 8:30 AM   LOS: 1 day

## 2021-02-07 LAB — CBC
HCT: 37 % — ABNORMAL LOW (ref 39.0–52.0)
Hemoglobin: 12.5 g/dL — ABNORMAL LOW (ref 13.0–17.0)
MCH: 30.6 pg (ref 26.0–34.0)
MCHC: 33.8 g/dL (ref 30.0–36.0)
MCV: 90.5 fL (ref 80.0–100.0)
Platelets: 168 10*3/uL (ref 150–400)
RBC: 4.09 MIL/uL — ABNORMAL LOW (ref 4.22–5.81)
RDW: 15 % (ref 11.5–15.5)
WBC: 8.8 10*3/uL (ref 4.0–10.5)
nRBC: 0 % (ref 0.0–0.2)

## 2021-02-07 LAB — COMPREHENSIVE METABOLIC PANEL
ALT: 27 U/L (ref 0–44)
AST: 43 U/L — ABNORMAL HIGH (ref 15–41)
Albumin: 3.3 g/dL — ABNORMAL LOW (ref 3.5–5.0)
Alkaline Phosphatase: 140 U/L — ABNORMAL HIGH (ref 38–126)
Anion gap: 10 (ref 5–15)
BUN: 17 mg/dL (ref 8–23)
CO2: 22 mmol/L (ref 22–32)
Calcium: 8.7 mg/dL — ABNORMAL LOW (ref 8.9–10.3)
Chloride: 92 mmol/L — ABNORMAL LOW (ref 98–111)
Creatinine, Ser: 0.8 mg/dL (ref 0.61–1.24)
GFR, Estimated: >60 mL/min (ref >60)
Glucose, Bld: 109 mg/dL — ABNORMAL HIGH (ref 70–99)
Potassium: 3.8 mmol/L (ref 3.5–5.1)
Sodium: 124 mmol/L — ABNORMAL LOW (ref 135–145)
Total Bilirubin: 1.7 mg/dL — ABNORMAL HIGH (ref 0.3–1.2)
Total Protein: 6.1 g/dL — ABNORMAL LOW (ref 6.5–8.1)

## 2021-02-07 LAB — MAGNESIUM: Magnesium: 1.7 mg/dL (ref 1.7–2.4)

## 2021-02-07 LAB — PROTIME-INR
INR: 2.4 — ABNORMAL HIGH (ref 0.8–1.2)
Prothrombin Time: 25.9 seconds — ABNORMAL HIGH (ref 11.4–15.2)

## 2021-02-07 MED ORDER — FUROSEMIDE 10 MG/ML IJ SOLN
40.0000 mg | Freq: Once | INTRAMUSCULAR | Status: AC
Start: 2021-02-07 — End: 2021-02-07
  Administered 2021-02-07: 40 mg via INTRAVENOUS
  Filled 2021-02-07: qty 4

## 2021-02-07 MED ORDER — WARFARIN - PHARMACIST DOSING INPATIENT: Freq: Every day | Status: DC

## 2021-02-07 MED ORDER — WARFARIN SODIUM 2 MG PO TABS
3.0000 mg | ORAL_TABLET | Freq: Once | ORAL | Status: AC
  Administered 2021-02-07: 3 mg via ORAL
  Filled 2021-02-07: qty 1

## 2021-02-07 NOTE — Progress Notes (Addendum)
ANTICOAGULATION CONSULT NOTE - Initial Consult  Pharmacy Consult for warfarin Indication: atrial fibrillation  Allergies  Allergen Reactions   Doxycycline Diarrhea   Contrast Media [Iodinated Contrast Media]     Syncope   Lunesta [Eszopiclone]     "felt WILD"   Morphine And Related Nausea And Vomiting   Trazodone And Nefazodone Other (See Comments)    Extremely drowsy the next day after taking.    Patient Measurements: Height: 6\' 2"  (188 cm) Weight: 85.6 kg (188 lb 11.4 oz) IBW/kg (Calculated) : 82.2 Heparin Dosing Weight:   Vital Signs: Temp: 97.7 F (36.5 C) (01/15 0400) Temp Source: Oral (01/15 0400) BP: 121/82 (01/15 0900) Pulse Rate: 107 (01/15 0900)  Labs: Recent Labs    02/05/21 1353 02/05/21 1435 02/05/21 1450 02/05/21 1609 02/06/21 0525 02/06/21 0903 02/07/21 0430  HGB 14.4  --   --   --  13.7  --  12.5*  HCT 40.0  --   --   --  37.7*  --  37.0*  PLT 188  --   --   --  186  --  168  LABPROT 57.2*  --   --   --  44.6* 40.5* 25.9*  INR 6.5*  --   --   --  4.8* 4.2* 2.4*  CREATININE 1.27* 1.22  --   --  0.99  --  0.80  TROPONINIHS  --   --  22* 22*  --   --   --     Estimated Creatinine Clearance: 75.6 mL/min (by C-G formula based on SCr of 0.8 mg/dL).   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   02/05/2021   furosemide (LASIX) 20 MG tablet TAKE 1 TABLET EVERY DAY (Patient taking differently: Take 20 mg by mouth daily.) 90 tablet 1 02/04/2021   levocetirizine (XYZAL) 5 MG tablet Take 5 mg by mouth daily as needed.   unk   losartan (COZAAR) 50 MG tablet Take 1 tablet (50 mg total) by mouth every morning. 90 tablet 1 02/04/2021   lovastatin (MEVACOR) 40 MG tablet Take 1 tablet (40 mg total) by mouth at bedtime. 90 tablet 3 02/04/2021   metoprolol tartrate (LOPRESSOR) 50 MG tablet Take 1 tablet (50 mg total) by mouth 2 (two) times daily. 180 tablet 0 02/04/2021 at 1600   Multiple Vitamin (MULTIVITAMIN WITH MINERALS) TABS tablet Take 1 tablet by mouth daily. One-A-Day for Men 50+   02/04/2021   Multiple Vitamins-Minerals (PRESERVISION AREDS 2 PO) Take 1 tablet by mouth in the morning and at bedtime.   02/04/2021   nitroGLYCERIN (NITROSTAT) 0.4 MG SL tablet PLACE 1 TABLET (0.4 MG  TOTAL) UNDER THE TONGUE EVERY 5 (FIVE) MINUTES AS NEEDED FOR CHEST PAIN. 100 tablet 3 unk   Omega-3 Fatty Acids (FISH OIL) 1200 MG CAPS Take 2,400 mg by mouth daily.   02/04/2021   omeprazole (PRILOSEC) 20 MG capsule Take 1 capsule (20 mg total) by mouth 2 (two) times daily before a meal. 180 capsule 1 02/04/2021   Probiotic Product (PHILLIPS COLON HEALTH PO) Take 1 capsule by mouth daily as needed.   unk   warfarin (COUMADIN) 5 MG tablet TAKE 1 TABLET EVERY DAY 90 tablet 0 02/04/2021 at 1700   traMADol (ULTRAM) 50 MG tablet Take 0.5-1 tablets (25-50 mg total) by mouth 2 (two) times daily as needed. (Patient not taking: Reported on 02/05/2021) 10 tablet 0 Not Taking    Assessment: Pharmacy consulted to dose warfarin in patient with atrial fibrillation.  Patient admitted with supratherapeutic INR of of 6.5. INR today 2.4.  Home dose listed as 5 mg daily.  Patient is also on amiodarone prior to  admission.  Patient may need to be on reduced dose based on admission INR.  CBC WNL  Goal of Therapy:  INR 2-3 Monitor platelets by anticoagulation protocol: Yes   Plan:  Warfarin 3 x 1 dose. Monitor daily INR and s/s of bleeding  Margot Ables, PharmD Clinical Pharmacist 02/07/2021 10:31 AM

## 2021-02-07 NOTE — Plan of Care (Signed)

## 2021-02-07 NOTE — Progress Notes (Signed)
PROGRESS NOTE  Danny [name]. GOT:[id number] DOB: [birth year] DOA: 02/05/2021 PCP: Loman Brooklyn, FNP   Brief History:  88 year old male with a history of peripheral vascular disease, left CEA, coronary disease status post CABG 2003, hypertension, hyperlipidemia, persistent atrial fibrillation on warfarin, anxiety presenting with altered mental status, generalized weakness, dyspnea, and poor p.o. intake.  The patient is a poor historian.  History is supplemented from the patient's spouse and niece.  Apparently, the patient has had increasing shortness of breath and dyspnea on exertion for the better part of a month which has worsened over the past week.  In addition, the patient has had increasing confusion for the past month which has significantly worsened over the past week prior to admission.  He has had decreased oral intake and increasing generalized weakness to the point where he was having difficulty getting out of bed for 2 days prior to this admission.  There was no particular complaints of fevers, chills, chest pain, vomiting, diarrhea.  Patient has been nauseous and has had decreased oral intake.  Complains of some constipation and abdominal discomfort which appears to be chronic. Has not been any changes in his medications for at least a year.  The patient has been resistant to follow-up with his medical providers.  In the ED, the patient was afebrile and hemodynamically stable.  Patient was in atrial fibrillation with RVR with heart rates 130-150.  Oxygen saturation was 90% on 2 L.  BMP shows sodium 120, potassium 4.2, bicarbonate 20, serum creatinine 1.27.  WBC 7.9, hemoglobin 14.4, platelets 188,000.  AST 51, ALT 34, alk phosphatase 198, total bilirubin 1.4.   Assessment/Plan:   Hyponatremia -Likely multifactorial but primarily driven by poor solute intake and volume depletion -initially fluid resuscitated -1/15--appears hypervolemic>>lasix IV x 1 -There is a component  of chronicity with sodium ranging 125-130 -FeNa 0.93% -Urine osmolarity--468 -Serum osmolarity--264   Persistent atrial fibrillation with RVR -Changed IV Lopressor 5 mg IV every 6 -Continue amiodarone>>start amio drip if HR remains poorly controlled -The patient has refused ablation in the past -Presented with supratherapeutic INR 6.5>>2.5 after vit K -Echo EF 40-45%, +moderate AS, RVSP 52.9   Supratherapeutic INR -No signs of bleeding -Given vitamin K 02/05/2021 -Repeat INR 2.4 -restart warfarin   Acute metabolic encephalopathy -Obtain UA--no pyuria -D97--4163 -Folic AGTX--64.6 -TSH 8.032 -CT brain negative for acute findings -1/15--more alert, but remains confused   Elevated lactate -Blood cultures x2 sets -Check PCT <0.10 -UA --no pyuria   Lower extremity pain and edema -Venous duplex--neg   Coronary artery disease -No chest pain presently -Troponin 22>> 22 -Personally reviewed EKG--atrial fibrillation, nonspecific ST-T wave change   Abdominal pain -CT abd/pelv--no acute findings; bilateral pleural effusions  R>L -improved -tolerating diet   Goals of Care -Advance care planning, including the explanation and discussion of advance directives was carried out with the patient and family.  Code status including explanations of "Full Code" and "DNR" and alternatives were discussed in detail.  Discussion of end-of-life issues including but not limited palliative care, hospice care and the concept of hospice, other end-of-life care options, power of attorney for health care decisions, living wills, and physician orders for life-sustaining treatment were also discussed with the patient and family.  Total face to face time 16 minutes. -confirmed DNR with spouse and niece                   Family Communication:  niece and spouse updated 1/15   Consultants:  none   Code Status:  DNR   DVT Prophylaxis:  coumadin     Procedures: As Listed in Progress Note  Above   Antibiotics: None    Subjective: Patient denies fevers, chills, headache, chest pain, dyspnea, nausea, vomiting, diarrhea, abdominal pain, dysuria, hematuria,    Objective: Vitals:   02/07/21 0500 02/07/21 0600 02/07/21 0700 02/07/21 0800  BP: 137/88 (!) 141/100 139/64   Pulse: (!) 103 97 84 (!) 105  Resp: 11 16 19 19   Temp:      TempSrc:      SpO2: 98% 96% 96% 94%  Weight: 85.6 kg     Height:        Intake/Output Summary (Last 24 hours) at 02/07/2021 0804 Last data filed at 02/07/2021 0737 Gross per 24 hour  Intake 2111.99 ml  Output 1275 ml  Net 836.99 ml   Weight change: 2.3 kg Exam:  General:  Pt is alert, follows commands appropriately, not in acute distress HEENT: No icterus, No thrush, No neck mass, [place]/AT Cardiovascular: IRRR, S1/S2, no rubs, no gallops Respiratory: bibasilar rales. No wheeze Abdomen: Soft/+BS, non tender, non distended, no guarding Extremities: 1 +LE edema, No lymphangitis, No petechiae, No rashes, no synovitis   Data Reviewed: I have personally reviewed following labs and imaging studies Basic Metabolic Panel: Recent Labs  Lab 02/05/21 1353 02/05/21 1435 02/05/21 1618 02/05/21 1819 02/06/21 0525 02/06/21 0903 02/07/21 0430  NA 120* 120* 120* 123* 126*  --  124*  K 4.6 4.2  --   --  3.9  --  3.8  CL 87* 86*  --   --  93*  --  92*  CO2 21* 20*  --   --  22  --  22  GLUCOSE 111* 111*  --   --  99  --  109*  BUN 28* 27*  --   --  21  --  17  CREATININE 1.27* 1.22  --   --  0.99  --  0.80  CALCIUM 8.9 9.1  --   --  8.9  --  8.7*  MG 1.9  --   --   --  1.8 1.8 1.7   Liver Function Tests: Recent Labs  Lab 02/05/21 1435 02/07/21 0430  AST 51* 43*  ALT 34 27  ALKPHOS 198* 140*  BILITOT 1.4* 1.7*  PROT 7.7 6.1*  ALBUMIN 4.1 3.3*   Recent Labs  Lab 02/06/21 0903  LIPASE 36   Recent Labs  Lab 02/05/21 1435  AMMONIA 22   Coagulation Profile: Recent Labs  Lab 02/05/21 1353 02/06/21 0525 02/06/21 0903  02/07/21 0430  INR 6.5* 4.8* 4.2* 2.4*   CBC: Recent Labs  Lab 02/05/21 1353 02/06/21 0525 02/07/21 0430  WBC 7.9 8.8 8.8  HGB 14.4 13.7 12.5*  HCT 40.0 37.7* 37.0*  MCV 89.3 90.2 90.5  PLT 188 186 168   Cardiac Enzymes: No results for input(s): CKTOTAL, CKMB, CKMBINDEX, TROPONINI in the last 168 hours. BNP: Invalid input(s): POCBNP CBG: Recent Labs  Lab 02/05/21 1338  GLUCAP 104*   HbA1C: No results for input(s): HGBA1C in the last 72 hours. Urine analysis:    Component Value Date/Time   COLORURINE YELLOW 02/06/2021 [place] 02/06/2021 1047   APPEARANCEUR Clear 05/05/2017 1346   LABSPEC 1.020 02/06/2021 1047   PHURINE 5.5 02/06/2021 1047   GLUCOSEU NEGATIVE 02/06/2021 1047   HGBUR NEGATIVE 02/06/2021 [name]  NEGATIVE 02/06/2021 1047   BILIRUBINUR Negative 05/05/2017 1346   KETONESUR NEGATIVE 02/06/2021 1047   PROTEINUR NEGATIVE 02/06/2021 1047   UROBILINOGEN negative 02/11/2015 1142   UROBILINOGEN 0.2 09/19/2012 2034   NITRITE NEGATIVE 02/06/2021 1047   LEUKOCYTESUR NEGATIVE 02/06/2021 1047   Sepsis Labs: @LABRCNTIP (procalcitonin:4,lacticidven:4) ) Recent Results (from the past 240 hour(s))  Resp Panel by RT-PCR (Flu A&B, Covid) Nasopharyngeal Swab     Status: None   Collection Time: 02/05/21  2:09 PM   Specimen: Nasopharyngeal Swab; Nasopharyngeal(NP) swabs in vial transport medium  Result Value Ref Range Status   SARS Coronavirus 2 by RT PCR NEGATIVE NEGATIVE Final    Comment: (NOTE) SARS-CoV-2 target nucleic acids are NOT DETECTED.  The SARS-CoV-2 RNA is generally detectable in upper respiratory specimens during the acute phase of infection. The lowest concentration of SARS-CoV-2 viral copies this assay can detect is 138 copies/mL. A negative result does not preclude SARS-Cov-2 infection and should not be used as the sole basis for treatment or other patient management decisions. A negative result may occur with   improper specimen collection/handling, submission of specimen other than nasopharyngeal swab, presence of viral mutation(s) within the areas targeted by this assay, and inadequate number of viral copies(<138 copies/mL). A negative result must be combined with clinical observations, patient history, and epidemiological information. The expected result is Negative.  Fact Sheet for Patients:  EntrepreneurPulse.com.au  Fact Sheet for Healthcare Providers:  IncredibleEmployment.be  This test is no t yet approved or cleared by the [place] FDA and  has been authorized for detection and/or diagnosis of SARS-CoV-2 by FDA under an Emergency Use Authorization (EUA). This EUA will remain  in effect (meaning this test can be used) for the duration of the COVID-19 declaration under Section 564(b)(1) of the Act, 21 U.S.C.section 360bbb-3(b)(1), unless the authorization is terminated  or revoked sooner.       Influenza A by PCR NEGATIVE NEGATIVE Final   Influenza B by PCR NEGATIVE NEGATIVE Final    Comment: (NOTE) The Xpert Xpress SARS-CoV-2/FLU/RSV plus assay is intended as an aid in the diagnosis of influenza from Nasopharyngeal swab specimens and should not be used as a sole basis for treatment. Nasal washings and aspirates are unacceptable for Xpert Xpress SARS-CoV-2/FLU/RSV testing.  Fact Sheet for Patients: EntrepreneurPulse.com.au  Fact Sheet for Healthcare Providers: IncredibleEmployment.be  This test is not yet approved or cleared by the [place] FDA and has been authorized for detection and/or diagnosis of SARS-CoV-2 by FDA under an Emergency Use Authorization (EUA). This EUA will remain in effect (meaning this test can be used) for the duration of the COVID-19 declaration under Section 564(b)(1) of the Act, 21 U.S.C. section 360bbb-3(b)(1), unless the authorization is terminated  or revoked.  Performed at [facility], [address] [place], [place] [address]   Blood Cultures (routine x 2)     Status: None (Preliminary result)   Collection Time: 02/05/21  2:35 PM   Specimen: BLOOD RIGHT ARM  Result Value Ref Range Status   Specimen Description   Final    BLOOD RIGHT ARM BOTTLES DRAWN AEROBIC AND ANAEROBIC   Special Requests Blood Culture adequate volume  Final   Culture   Final    NO GROWTH < 24 HOURS Performed at [facility], [address], [place], [place] [address]    Report Status PENDING  Incomplete  Blood Cultures (routine x 2)     Status: None (Preliminary result)   Collection Time: 02/05/21  2:35 PM  Specimen: BLOOD LEFT ARM  Result Value Ref Range Status   Specimen Description BLOOD LEFT ARM BOTTLES DRAWN AEROBIC AND ANAEROBIC  Final   Special Requests Blood Culture adequate volume  Final   Culture   Final    NO GROWTH < 24 HOURS Performed at [facility], [address], [place], [place] [address]    Report Status PENDING  Incomplete  MRSA Next Gen by PCR, Nasal     Status: None   Collection Time: 02/05/21 10:05 PM   Specimen: Nasal Mucosa; Nasal Swab  Result Value Ref Range Status   MRSA by PCR Next Gen NOT DETECTED NOT DETECTED Final    Comment: (NOTE) The GeneXpert MRSA Assay (FDA approved for NASAL specimens only), is one component of a comprehensive MRSA colonization surveillance program. It is not intended to diagnose MRSA infection nor to guide or monitor treatment for MRSA infections. Test performance is not FDA approved in patients less than 50 years old. Performed at [facility], [address], [place], [place] [address]      Scheduled Meds:  amiodarone  100 mg Oral Daily   Chlorhexidine Gluconate Cloth  6 each Topical Daily   furosemide  20 mg Oral Daily   melatonin  3 mg Oral QHS   metoprolol tartrate  5 mg Intravenous Q6H   pantoprazole  40 mg Oral Daily   pravastatin  40 mg Oral q1800   Continuous  Infusions:  Procedures/Studies: CT ABDOMEN PELVIS WO CONTRAST  Result Date: 02/06/2021 CLINICAL DATA:  Worsening abdominal pain, shortness of breath EXAM: CT ABDOMEN AND PELVIS WITHOUT CONTRAST TECHNIQUE: Multidetector CT imaging of the abdomen and pelvis was performed following the standard protocol without IV contrast.This exam was performed according to the departmental dose-optimization program which includes automated exposure control, adjustment of the mA and/or kV according to patient size and/or use of iterative reconstruction technique. COMPARISON:  09/19/2012 FINDINGS: Lower chest: Moderate right and small left pleural effusions. Partially calcified left pleural plaque. Dependent atelectasis/consolidation posteriorly in the lung bases right greater than left. Extensive coronary arterial calcifications. Hepatobiliary: No focal liver abnormality is seen. No gallstones, gallbladder wall thickening, or biliary dilatation. Pancreas: Parenchymal atrophy. No mass, ductal dilatation, or regional inflammatory change. Spleen: Normal in size.  Multiple small calcified granulomas. Adrenals/Urinary Tract: No adrenal mass. No urolithiasis or hydronephrosis. Urinary bladder physiologically distended. Stomach/Bowel: Stomach and small bowel are nondistended. The colon is nondilated. A few scattered sigmoid diverticula without significant adjacent inflammatory change. Vascular/Lymphatic: Calcified atheromatous plaque in the abdominal aorta involving visceral and renal branches, without aneurysm. No abdominal or pelvic adenopathy. Reproductive: Prostate is unremarkable. Other: No ascites.  No free air. Musculoskeletal: Sternotomy wires. Spondylitic changes throughout the lumbar spine. IMPRESSION: 1. Moderate right and small left pleural effusions. 2. Coronary and Aortic Atherosclerosis (ICD10-170.0). 3. Sigmoid diverticulosis Electronically Signed   By: Lucrezia Europe M.D.   On: 02/06/2021 14:03   CT HEAD WO  CONTRAST  Result Date: 02/05/2021 CLINICAL DATA:  Mental status change, unknown cause EXAM: CT HEAD WITHOUT CONTRAST TECHNIQUE: Contiguous axial images were obtained from the base of the skull through the vertex without intravenous contrast. RADIATION DOSE REDUCTION: This exam was performed according to the departmental dose-optimization program which includes automated exposure control, adjustment of the mA and/or kV according to patient size and/or use of iterative reconstruction technique. COMPARISON:  None. FINDINGS: Brain: No evidence of acute intracranial hemorrhage or extra-axial collection.No evidence of mass lesion/concern mass effect.The ventricles are normal in size.Scattered subcortical and  periventricular white matter hypodensities, nonspecific but likely sequela of chronic small vessel ischemic disease. Vascular: No hyperdense vessel or unexpected calcification. Skull: Normal. Negative for fracture or focal lesion. Sinuses/Orbits: There is mucosal thickening of the left maxillary sinus with frothy material. The orbits are unremarkable. Other: None IMPRESSION: No acute intracranial abnormality. Mild sequela of chronic small vessel ischemic disease. Electronically Signed   By: Maurine Simmering M.D.   On: 02/05/2021 15:12   US Venous Img Lower Bilateral (DVT)  Result Date: 02/06/2021 CLINICAL DATA:  increasing shortness of breath and dyspnea on exertion for the better part of a month which has worsened over the past week. increasing confusion for the past month which has significantly worsened over the past week prior to admission. decreased oral intake and increasing generalized weakness to the point where he was having difficulty getting out of bed for 2 days prior to this admission. No fevers, chills, chest pain, vomiting, diarrhea. nauseous and has had decreased oral intake. constipation and abdominal discomfort which appears to be chronic. Hyponatremia, leg cramping x1 day EXAM: BILATERAL LOWER  EXTREMITY VENOUS DOPPLER ULTRASOUND TECHNIQUE: Gray-scale sonography with compression, as well as color and duplex ultrasound, were performed to evaluate the deep venous system(s) from the level of the common femoral vein through the popliteal and proximal calf veins. COMPARISON:  09/01/2006 right, by report only FINDINGS: VENOUS Normal compressibility of the common femoral, superficial femoral, and popliteal veins, as well as the visualized calf veins. Visualized portions of profunda femoral vein and great saphenous vein unremarkable. No filling defects to suggest DVT on grayscale or color Doppler imaging. Doppler waveforms show normal direction of venous flow, normal respiratory phasicity and response to augmentation. OTHER None. Limitations: none IMPRESSION: No femoropopliteal DVT nor evidence of DVT within the visualized calf veins. If clinical symptoms are inconsistent or if there are persistent or worsening symptoms, further imaging (possibly involving the iliac veins) may be warranted. Electronically Signed   By: Lucrezia Europe M.D.   On: 02/06/2021 13:38   DG Chest Port 1 View  Result Date: 02/05/2021 CLINICAL DATA:  Atrial fibrillation with rapid ventricular response EXAM: PORTABLE CHEST 1 VIEW COMPARISON:  Portable exam 1343 hours compared to 04/07/2016 FINDINGS: Enlargement of cardiac silhouette post CABG. Mild pulmonary vascular congestion. Atherosclerotic calcification aorta. RIGHT pleural effusion and basilar atelectasis. Remaining lungs clear. No definite infiltrate or pneumothorax. Bones demineralized. IMPRESSION: Enlargement of cardiac silhouette with pulmonary vascular congestion post CABG. New RIGHT pleural effusion and basilar atelectasis of unknown etiology; follow-up radiographs until resolution recommended to exclude underlying abnormalities. Aortic Atherosclerosis (ICD10-I70.0). Electronically Signed   By: Lavonia Dana M.D.   On: 02/05/2021 13:57   ECHOCARDIOGRAM COMPLETE  Result Date:  02/06/2021    ECHOCARDIOGRAM REPORT   Patient Name:   Dock Baccam. Date of Exam: 02/06/2021 Medical Rec #:  [id number]         Height:       74.0 in Accession #:    [id number]        Weight:       183.6 lb Date of Birth:  May 22, 1931         BSA:          2.096 m Patient Age:    3 years          BP:           155/85 mmHg Patient Gender: M  HR:           93 bpm. Exam Location:  Inpatient Procedure: 2D Echo, 3D Echo, Cardiac Doppler, Color Doppler and Intracardiac            Opacification Agent Indications:    I48.91* Unspeicified atrial fibrillation; I10 Hypertension  History:        Patient has prior history of Echocardiogram examinations. CAD,                 Abnormal ECG and Prior CABG, Pulmonary HTN, Arrythmias:Atrial                 Fibrillation, Signs/Symptoms:Edema, Shortness of Breath, Dyspnea                 and Altered Mental Status; Risk Factors:Hypertension,                 Dyslipidemia and Former Smoker.  Sonographer:    Roseanna Rainbow RDCS Referring Phys: [phone] Ezrael Sam  Sonographer Comments: Technically difficult study due to poor echo windows. Patient could not follow instructions. IMPRESSIONS  1. Left ventricular ejection fraction, by estimation, is 40-45%. The left ventricle has mildly decreased function. The left ventricle demonstrates regional wall motion abnormalities (abnormal septal motion). There is mild concentric left ventricular hypertrophy. Left ventricular diastolic parameters are indeterminate. There is the interventricular septum is flattened in systole, consistent with right ventricular pressure overload.  2. Right ventricular systolic function is mildly reduced. The right ventricular size is enlarged. There is moderately elevated pulmonary artery systolic pressure. The estimated right ventricular systolic pressure is 96.0 mmHg.  3. Right atrial size was moderately dilated.  4. The mitral valve is abnormal. No evidence of mitral valve regurgitation. No evidence of mitral  stenosis. The mean mitral valve gradient is 3.0 mmHg with average heart rate of 119 bpm. Mitral valve area by continuity equation 3.3 cm2.  5. Tricuspid valve regurgitation is severe.  6. The aortic valve is calcified. Aortic valve regurgitation is not visualized. Moderate aortic valve stenosis. There is low flow low gradient aortic stenosis, mean gradient 25 mm Hg but in the setting of decreased LV stroke volume. DVI 0.39. Comparison(s): Compared to 2021 study, LVEF and AS are worse; prior study not done in the setting of this tachycardia. FINDINGS  Left Ventricle: Left ventricular ejection fraction, by estimation, is 40 to 45%. The left ventricle has mildly decreased function. The left ventricle demonstrates regional wall motion abnormalities. Definity contrast agent was given IV to delineate the left ventricular endocardial borders. The left ventricular internal cavity size was normal in size. There is mild concentric left ventricular hypertrophy. Abnormal (paradoxical) septal motion consistent with post-operative status and the interventricular  septum is flattened in systole, consistent with right ventricular pressure overload. Left ventricular diastolic parameters are indeterminate. Right Ventricle: The right ventricular size is mildly enlarged. No increase in right ventricular wall thickness. Right ventricular systolic function is mildly reduced. There is moderately elevated pulmonary artery systolic pressure. The tricuspid regurgitant velocity is 3.08 m/s, and with an assumed right atrial pressure of 15 mmHg, the estimated right ventricular systolic pressure is 45.4 mmHg. Left Atrium: Left atrial size was normal in size. Right Atrium: Right atrial size was moderately dilated. Pericardium: There is no evidence of pericardial effusion. Mitral Valve: The mitral valve is abnormal. No evidence of mitral valve regurgitation. No evidence of mitral valve stenosis. MV peak gradient, 9.2 mmHg. The mean mitral valve  gradient is 3.0 mmHg with average heart rate of  119 bpm. Tricuspid Valve: The tricuspid valve is normal in structure. Tricuspid valve regurgitation is severe. Aortic Valve: The aortic valve is calcified. Aortic valve regurgitation is not visualized. Moderate aortic stenosis is present. Aortic valve mean gradient measures 25.0 mmHg. Aortic valve peak gradient measures 39.1 mmHg. Aortic valve area, by VTI measures 0.94 cm. Pulmonic Valve: The pulmonic valve was not well visualized. Pulmonic valve regurgitation is not visualized. No evidence of pulmonic stenosis. Aorta: The aortic root and ascending aorta are structurally normal, with no evidence of dilitation. IAS/Shunts: No atrial level shunt detected by color flow Doppler.  LEFT VENTRICLE PLAX 2D LVIDd:         4.70 cm LVIDs:         3.80 cm LV PW:         1.30 cm LV IVS:        1.20 cm LVOT diam:     2.00 cm LV SV:         62 LV SV Index:   29 LVOT Area:     3.14 cm  LV Volumes (MOD) LV vol d, MOD A2C: 60.4 ml LV vol d, MOD A4C: 87.5 ml LV vol s, MOD A2C: 36.0 ml LV vol s, MOD A4C: 49.0 ml LV SV MOD A2C:     24.4 ml LV SV MOD A4C:     87.5 ml LV SV MOD BP:      33.2 ml RIGHT VENTRICLE            IVC RV S prime:     6.98 cm/s  IVC diam: 3.50 cm TAPSE (M-mode): 0.8 cm LEFT ATRIUM             Index        RIGHT ATRIUM           Index LA diam:        3.95 cm 1.88 cm/m   RA Area:     29.50 cm LA Vol (A2C):   59.5 ml 28.39 ml/m  RA Volume:   94.40 ml  45.04 ml/m LA Vol (A4C):   55.9 ml 26.67 ml/m LA Biplane Vol: 61.5 ml 29.34 ml/m  AORTIC VALVE AV Area (Vmax):    1.30 cm AV Area (Vmean):   1.16 cm AV Area (VTI):     0.94 cm AV Vmax:           312.60 cm/s AV Vmean:          219.000 cm/s AV VTI:            0.656 m AV Peak Grad:      39.1 mmHg AV Mean Grad:      25.0 mmHg LVOT Vmax:         129.00 cm/s LVOT Vmean:        80.800 cm/s LVOT VTI:          0.196 m LVOT/AV VTI ratio: 0.30  AORTA Ao Root diam: 3.40 cm Ao Asc diam:  3.20 cm MITRAL VALVE               TRICUSPID VALVE MV Area (PHT): 3.91 cm   TR Peak grad:   37.9 mmHg MV Area VTI:   3.17 cm   TR Vmax:        308.00 cm/s MV Peak grad:  9.2 mmHg MV Mean grad:  3.0 mmHg   SHUNTS MV Vmax:       1.52 m/s   Systemic VTI:  0.20 m MV Vmean:  76.9 cm/s  Systemic Diam: 2.00 cm MV Decel Time: 194 msec Rudean Haskell MD Electronically signed by Rudean Haskell MD Signature Date/Time: 02/06/2021/2:36:02 PM    Final     Orson Eva, DO  [facility]  If 7PM-7AM, please contact night-coverage [email or web address] Password [email or web address] 02/07/2021, 8:04 AM   LOS: 2 days

## 2021-02-08 ENCOUNTER — Inpatient Hospital Stay (HOSPITAL_COMMUNITY): Payer: Medicare HMO

## 2021-02-08 DIAGNOSIS — I4819 Other persistent atrial fibrillation: Secondary | ICD-10-CM

## 2021-02-08 DIAGNOSIS — R791 Abnormal coagulation profile: Secondary | ICD-10-CM

## 2021-02-08 DIAGNOSIS — I4891 Unspecified atrial fibrillation: Secondary | ICD-10-CM

## 2021-02-08 DIAGNOSIS — I5021 Acute systolic (congestive) heart failure: Secondary | ICD-10-CM

## 2021-02-08 LAB — COMPREHENSIVE METABOLIC PANEL
ALT: 26 U/L (ref 0–44)
AST: 36 U/L (ref 15–41)
Albumin: 3 g/dL — ABNORMAL LOW (ref 3.5–5.0)
Alkaline Phosphatase: 133 U/L — ABNORMAL HIGH (ref 38–126)
Anion gap: 9 (ref 5–15)
BUN: 21 mg/dL (ref 8–23)
CO2: 24 mmol/L (ref 22–32)
Calcium: 8.1 mg/dL — ABNORMAL LOW (ref 8.9–10.3)
Chloride: 92 mmol/L — ABNORMAL LOW (ref 98–111)
Creatinine, Ser: 0.88 mg/dL (ref 0.61–1.24)
GFR, Estimated: >60 mL/min (ref >60)
Glucose, Bld: 104 mg/dL — ABNORMAL HIGH (ref 70–99)
Potassium: 3 mmol/L — ABNORMAL LOW (ref 3.5–5.1)
Sodium: 125 mmol/L — ABNORMAL LOW (ref 135–145)
Total Bilirubin: 1.8 mg/dL — ABNORMAL HIGH (ref 0.3–1.2)
Total Protein: 5.7 g/dL — ABNORMAL LOW (ref 6.5–8.1)

## 2021-02-08 LAB — CBC
HCT: 35.6 % — ABNORMAL LOW (ref 39.0–52.0)
Hemoglobin: 12.3 g/dL — ABNORMAL LOW (ref 13.0–17.0)
MCH: 30.8 pg (ref 26.0–34.0)
MCHC: 34.6 g/dL (ref 30.0–36.0)
MCV: 89.2 fL (ref 80.0–100.0)
Platelets: 171 10*3/uL (ref 150–400)
RBC: 3.99 MIL/uL — ABNORMAL LOW (ref 4.22–5.81)
RDW: 14.9 % (ref 11.5–15.5)
WBC: 7.7 10*3/uL (ref 4.0–10.5)
nRBC: 0 % (ref 0.0–0.2)

## 2021-02-08 LAB — RAPID URINE DRUG SCREEN, HOSP PERFORMED
Amphetamines: NOT DETECTED
Barbiturates: NOT DETECTED
Benzodiazepines: NOT DETECTED
Cocaine: NOT DETECTED
Opiates: NOT DETECTED
Tetrahydrocannabinol: NOT DETECTED

## 2021-02-08 LAB — PROTIME-INR
INR: 1.7 — ABNORMAL HIGH (ref 0.8–1.2)
Prothrombin Time: 19.5 s — ABNORMAL HIGH (ref 11.4–15.2)

## 2021-02-08 LAB — URINE CULTURE: Culture: NO GROWTH

## 2021-02-08 LAB — MAGNESIUM: Magnesium: 1.6 mg/dL — ABNORMAL LOW (ref 1.7–2.4)

## 2021-02-08 MED ORDER — POTASSIUM CHLORIDE CRYS ER 20 MEQ PO TBCR
40.0000 meq | EXTENDED_RELEASE_TABLET | ORAL | Status: AC
  Administered 2021-02-08 (×2): 40 meq via ORAL
  Filled 2021-02-08 (×2): qty 2

## 2021-02-08 MED ORDER — METOPROLOL TARTRATE 50 MG PO TABS
50.0000 mg | ORAL_TABLET | Freq: Two times a day (BID) | ORAL | Status: DC
  Administered 2021-02-08 (×2): 50 mg via ORAL
  Filled 2021-02-08 (×2): qty 1

## 2021-02-08 MED ORDER — FUROSEMIDE 10 MG/ML IJ SOLN
40.0000 mg | Freq: Every day | INTRAMUSCULAR | Status: DC
  Administered 2021-02-08 – 2021-02-10 (×3): 40 mg via INTRAVENOUS
  Filled 2021-02-08 (×3): qty 4

## 2021-02-08 MED ORDER — FUROSEMIDE 10 MG/ML IJ SOLN: 40.0000 mg | Freq: Once | INTRAMUSCULAR | Status: DC

## 2021-02-08 MED ORDER — MAGNESIUM SULFATE 2 GM/50ML IV SOLN
2.0000 g | Freq: Once | INTRAVENOUS | Status: AC
  Administered 2021-02-08: 2 g via INTRAVENOUS
  Filled 2021-02-08: qty 50

## 2021-02-08 MED ORDER — METOPROLOL TARTRATE 50 MG PO TABS: 50.0000 mg | ORAL_TABLET | Freq: Two times a day (BID) | ORAL | Status: DC

## 2021-02-08 MED ORDER — WARFARIN SODIUM 5 MG PO TABS
5.0000 mg | ORAL_TABLET | Freq: Once | ORAL | Status: AC
  Administered 2021-02-08: 5 mg via ORAL
  Filled 2021-02-08: qty 1

## 2021-02-08 NOTE — Evaluation (Signed)
Physical Therapy Evaluation Patient Details Name: Danny [name]. MRN: [id number] DOB: 12-Jun-1931 Today's Date: 02/08/2021  History of Present Illness  Danny [name]. is a 88 y.o. male with a history of paroxysmal atrial fibrillation on Coumadin, coronary artery disease, hypertension, hyperlipidemia.  Patient was sent to the hospital by his PCP due to elevated blood pressures, confusion.  In the office, his blood pressure was 200/130 with a heart rate in the 130s and mildly confused.  The patient had an EKG and was found to be in A. fib with RVR.  EMS was called and the patient was transported to the hospital.  Initially, the patient states that he did not feel well.  In my interview, the patient states that he is feeling fine, but does not recall much of her earlier today.  He is accompanied by his niece, who provides history.  She states that he has been fairly confused, worse than normal.  No palliating or provoking factors.   Clinical Impression  Patient has difficulty completing sit to stands due to BLE weakness, had to use RW, demonstrates fair/good return for ambulation in room/hallway without loss of balance, limited mostly due to c/o fatigue and mild SOB.  Patient tolerated staying up in chair after therapy - nursing staff aware.  Patient will benefit from continued skilled physical therapy in hospital and recommended venue below to increase strength, balance, endurance for safe ADLs and gait.         Recommendations for follow up therapy are one component of a multi-disciplinary discharge planning process, led by the attending physician.  Recommendations may be updated based on patient status, additional functional criteria and insurance authorization.  Follow Up Recommendations Home health PT    Assistance Recommended at Discharge Set up Supervision/Assistance  Patient can return home with the following  A little help with walking and/or transfers;A little help with  bathing/dressing/bathroom;Help with stairs or ramp for entrance    Equipment Recommendations None recommended by PT  Recommendations for Other Services       Functional Status Assessment Patient has had a recent decline in their functional status and demonstrates the ability to make significant improvements in function in a reasonable and predictable amount of time.     Precautions / Restrictions Precautions Precautions: Fall Restrictions Weight Bearing Restrictions: No      Mobility  Bed Mobility Overal bed mobility: Modified Independent                  Transfers Overall transfer level: Needs assistance Equipment used: Rolling walker (2 wheels) Transfers: Sit to/from Stand;Bed to chair/wheelchair/BSC Sit to Stand: Min assist   Step pivot transfers: Min guard       General transfer comment: unable to complete sit to stands without AD due to BLE weakness    Ambulation/Gait Ambulation/Gait assistance: Min guard;Min assist Gait Distance (Feet): 55 Feet Assistive device: Rolling walker (2 wheels) Gait Pattern/deviations: Decreased step length - right;Decreased step length - left;Decreased stride length Gait velocity: decreased     General Gait Details: slow labored cadence without loss of balance, limited mostly due to c/o fatigue  Stairs            Wheelchair Mobility    Modified Rankin (Stroke Patients Only)       Balance Overall balance assessment: Needs assistance Sitting-balance support: Feet supported;No upper extremity supported Sitting balance-Leahy Scale: Good Sitting balance - Comments: seated at EOB   Standing balance support: During functional activity;No upper extremity  supported Standing balance-Leahy Scale: Poor Standing balance comment: fair using RW                             Pertinent Vitals/Pain Pain Assessment: No/denies pain    Home Living Family/patient expects to be discharged to:: Private  residence Living Arrangements: Spouse/significant other Available Help at Discharge: Family;Available 24 hours/day Type of Home: House Home Access: Ramped entrance     Alternate Level Stairs-Number of Steps: 4 into [employer]: Two level;Able to live on main level with bedroom/bathroom;Full bath on main level Home Equipment: Rolling Walker (2 wheels);Crutches;Cane - single point;Wheelchair - manual;BSC/3in1;Shower seat;Grab bars - tub/shower      Prior Function Prior Level of Function : Independent/Modified Independent             Mobility Comments: Hydrographic surveyor, drives, "per patient" ADLs Comments: Independent     Hand Dominance        Extremity/Trunk Assessment   Upper Extremity Assessment Upper Extremity Assessment: Overall WFL for tasks assessed    Lower Extremity Assessment Lower Extremity Assessment: Generalized weakness    Cervical / Trunk Assessment Cervical / Trunk Assessment: Normal  Communication   Communication: No difficulties  Cognition Arousal/Alertness: Awake/alert Behavior During Therapy: WFL for tasks assessed/performed Overall Cognitive Status: Within Functional Limits for tasks assessed                                          General Comments      Exercises     Assessment/Plan    PT Assessment Patient needs continued PT services  PT Problem List Decreased activity tolerance;Decreased strength;Decreased balance;Decreased mobility       PT Treatment Interventions DME instruction;Gait training;Stair training;Functional mobility training;Therapeutic activities;Therapeutic exercise;Patient/family education;Balance training    PT Goals (Current goals can be found in the Care Plan section)  Acute Rehab PT Goals Patient Stated Goal: return home with family to assist PT Goal Formulation: With patient Time For Goal Achievement: 02/12/21 Potential to Achieve Goals: Good    Frequency Min 3X/week      Co-evaluation               AM-PAC PT "6 Clicks" Mobility  Outcome Measure Help needed turning from your back to your side while in a flat bed without using bedrails?: None Help needed moving from lying on your back to sitting on the side of a flat bed without using bedrails?: None Help needed moving to and from a bed to a chair (including a wheelchair)?: A Little Help needed standing up from a chair using your arms (e.g., wheelchair or bedside chair)?: A Lot Help needed to walk in hospital room?: A Little Help needed climbing 3-5 steps with a railing? : A Lot 6 Click Score: 18    End of Session   Activity Tolerance: Patient tolerated treatment well;Patient limited by fatigue Patient left: in chair;with call bell/phone within reach;with chair alarm set Nurse Communication: Mobility status PT Visit Diagnosis: Unsteadiness on feet (R26.81);Other abnormalities of gait and mobility (R26.89);Muscle weakness (generalized) (M62.81)    Time: 1287-8676 PT Time Calculation (min) (ACUTE ONLY): 22 min   Charges:   PT Evaluation $PT Eval Moderate Complexity: 1 Mod PT Treatments $Therapeutic Activity: 8-22 mins        3:43 PM, 02/08/21 Lonell Grandchild, MPT Physical Therapist with [place]  [place] office [phone] mobile phone

## 2021-02-08 NOTE — Progress Notes (Signed)
ANTICOAGULATION CONSULT NOTE - Initial Consult  Pharmacy Consult for warfarin Indication: atrial fibrillation  Allergies  Allergen Reactions   Doxycycline Diarrhea   Contrast Media [Iodinated Contrast Media]     Syncope   Lunesta [Eszopiclone]     "felt WILD"   Morphine And Related Nausea And Vomiting   Trazodone And Nefazodone Other (See Comments)    Extremely drowsy the next day after taking.    Patient Measurements: Height: 6\' 2"  (188 cm) Weight: 87.3 kg (192 lb 7.4 oz) IBW/kg (Calculated) : 82.2 Heparin Dosing Weight:   Vital Signs: Temp: 97.9 F (36.6 C) (01/16 0400) Temp Source: Oral (01/16 0400) BP: 140/91 (01/16 0700) Pulse Rate: 104 (01/16 0700)  Labs: Recent Labs    02/05/21 1450 02/05/21 1609 02/06/21 0525 02/06/21 0903 02/07/21 0430 02/08/21 0422  HGB  --   --  13.7  --  12.5* 12.3*  HCT  --   --  37.7*  --  37.0* 35.6*  PLT  --   --  186  --  168 171  LABPROT  --   --  44.6* 40.5* 25.9* 19.5*  INR  --   --  4.8* 4.2* 2.4* 1.7*  CREATININE  --   --  0.99  --  0.80 0.88  TROPONINIHS 22* 22*  --   --   --   --      Estimated Creatinine Clearance: 68.8 mL/min (by C-G formula based on SCr of 0.88 mg/dL).   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   02/05/2021   furosemide (LASIX) 20 MG tablet TAKE 1 TABLET EVERY DAY (Patient taking differently: Take 20 mg by mouth daily.) 90 tablet 1 02/04/2021   levocetirizine (XYZAL) 5 MG tablet Take 5 mg by mouth daily as needed.   unk   losartan (COZAAR) 50 MG tablet Take 1 tablet (50 mg total) by mouth every morning. 90 tablet 1 02/04/2021   lovastatin (MEVACOR) 40 MG tablet Take 1 tablet (40 mg total) by mouth at bedtime. 90 tablet 3 02/04/2021   metoprolol tartrate (LOPRESSOR) 50 MG tablet Take 1 tablet (50 mg total) by mouth 2 (two) times daily. 180 tablet 0 02/04/2021 at 1600   Multiple Vitamin (MULTIVITAMIN WITH MINERALS) TABS tablet Take 1 tablet by mouth daily. One-A-Day for Men 50+   02/04/2021   Multiple Vitamins-Minerals (PRESERVISION AREDS 2 PO) Take 1 tablet by mouth in the morning and at bedtime.   02/04/2021   nitroGLYCERIN (NITROSTAT) 0.4 MG SL tablet PLACE 1 TABLET (0.4 MG TOTAL) UNDER THE TONGUE EVERY 5 (FIVE) MINUTES AS NEEDED FOR CHEST PAIN. 100 tablet 3 unk   Omega-3  Fatty Acids (FISH OIL) 1200 MG CAPS Take 2,400 mg by mouth daily.   02/04/2021   omeprazole (PRILOSEC) 20 MG capsule Take 1 capsule (20 mg total) by mouth 2 (two) times daily before a meal. 180 capsule 1 02/04/2021   Probiotic Product (PHILLIPS COLON HEALTH PO) Take 1 capsule by mouth daily as needed.   unk   warfarin (COUMADIN) 5 MG tablet TAKE 1 TABLET EVERY DAY 90 tablet 0 02/04/2021 at 1700   traMADol (ULTRAM) 50 MG tablet Take 0.5-1 tablets (25-50 mg total) by mouth 2 (two) times daily as needed. (Patient not taking: Reported on 02/05/2021) 10 tablet 0 Not Taking    Assessment: Pharmacy consulted to dose warfarin in patient with atrial fibrillation.  Patient admitted with supratherapeutic INR of of 6.5. INR today 2.4.  Home dose listed as 5 mg daily.  Patient is also on amiodarone prior to admission.  Patient may need to be on reduced dose  based on admission INR.  CBC WNL IN 1.7  Goal of Therapy:  INR 2-3 Monitor platelets by anticoagulation protocol: Yes   Plan:  Warfarin 5 mg x 1 dose. Monitor daily INR and s/s of bleeding  Margot Ables, PharmD Clinical Pharmacist 02/08/2021 8:03 AM

## 2021-02-08 NOTE — Plan of Care (Signed)
°  Problem: Acute Rehab PT Goals(only PT should resolve) Goal: Pt Will Go Supine/Side To Sit Outcome: Progressing Flowsheets (Taken 02/08/2021 1544) Pt will go Supine/Side to Sit: Independently Goal: Patient Will Transfer Sit To/From Stand Outcome: Progressing Flowsheets (Taken 02/08/2021 1544) Patient will transfer sit to/from stand:  with min guard assist  with supervision Goal: Pt Will Transfer Bed To Chair/Chair To Bed Outcome: Progressing Flowsheets (Taken 02/08/2021 1544) Pt will Transfer Bed to Chair/Chair to Bed: with supervision Goal: Pt Will Ambulate Outcome: Progressing Flowsheets (Taken 02/08/2021 1544) Pt will Ambulate:  100 feet  with supervision  with rolling walker   3:44 PM, 02/08/21 Danny [name], MPT Physical Therapist with [facility] [phone] office [phone] mobile phone

## 2021-02-08 NOTE — Plan of Care (Signed)

## 2021-02-08 NOTE — Consult Note (Addendum)
Cardiology Consultation:   Patient ID: Danny [name]. MRN: [id number]; DOB: 1931/10/08  Admit date: 02/05/2021 Date of Consult: 02/08/2021  PCP:  Loman Brooklyn, [facility] Providers Cardiologist:  [name], MD        Patient Profile:   Danny [name]. is a 88 y.o. male with a hx of CAD, PAF on amio who is being seen 02/08/2021 for the evaluation of Afib with RVR at the request of Dr. Carles Collet.  History of Present Illness:   Mr. Duval with a history of  PVD s/p L CEA w/ R subclavian bpg 2009 and PTCA 2010, CAD s/p CABG 2003 w/ LIMA-LAD, RIMA-RI, SVG-OM3, PAF on coumadin and Amio, HTN, HLD, and GERD.   Patient saw Dr. Johnsie Cancel 01/2020 with DOE and underwent R/L heart cath -see below for details but medical management recommended. Right heart pressures normal and hasn't been seen by [place] since.  Patient presents with acute metabolic encephalopathy, poor oral intake, volume depletion, hyponatremia, hypokalemia, hypomagnesemia and found to be in Afib with RVR. Echo 02/06/21 shows worsened LVEF 40-45% with mod decrease RV and severe TR but in Afib with RVR at time of study. INR 6.5 treated with Vit K.Bilateral pleural effusions.  Patient currently pleasantly confused. Says his heart if fine and he's going to rehab facility today. He is not sure why he's here and denies cardiac complaints of chest pain, dyspnea, palpitations, dizziness or presyncope. Initially told me he only takes tylenol at home but then says he takes 3 prescriptions, unsure what they are.   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  01/14/2014   Vitamin D deficiency     Past Surgical History:  Procedure Laterality Date   CARDIOVERSION N/A 05/05/2016   Procedure: CARDIOVERSION;  Surgeon: Josue Hector, MD;  Location: [facility];  Service: Cardiovascular;  Laterality: N/A;   CAROTID ENDARTERECTOMY Left 2003   CAROTID-SUBCLAVIAN BYPASS GRAFT Right    COLONOSCOPY  2005   hyperplastic polyp x 1 with no adenoma   COLONOSCOPY N/A 04/11/2014   OJJ:KKXF diverticulosis in the sigmoid colon/left colon is redundant   CORONARY ARTERY BYPASS GRAFT     ESOPHAGEAL DILATION N/A 04/11/2014   Procedure: ESOPHAGEAL DILATION;  Surgeon: [name], MD;  [facility];  Service: Endoscopy;  Laterality: N/A;   ESOPHAGOGASTRODUODENOSCOPY N/A 04/11/2014   GHW:EXHBZJIRC at the gastro junction/moderate erosive/duodenal web   EYE SURGERY Bilateral    cataracts   RIGHT/LEFT HEART CATH AND CORONARY/GRAFT ANGIOGRAPHY N/A 02/11/2020   Procedure: RIGHT/LEFT HEART CATH AND CORONARY/GRAFT ANGIOGRAPHY;  Surgeon: Martinique, Peter M, MD;  Location: [facility];  Service: Cardiovascular;  Laterality: N/A;   SKIN CANCER EXCISION       Home Medications:  Prior to Admission medications   Medication Sig Start Date End Date Taking? Authorizing Provider  amiodarone (PACERONE) 200 MG tablet TAKE 1/2 TABLET EVERY DAY (NEED MD APPOINTMENT) Patient taking differently: Take 100 mg by mouth daily. 10/27/20  Yes Josue Hector, MD  cholecalciferol (VITAMIN D) 25  MCG (1000 UNIT) tablet Take 2,000 Units by mouth daily with breakfast.   Yes [provider]  furosemide (LASIX) 20 MG tablet TAKE 1 TABLET EVERY DAY Patient taking differently: Take 20 mg by mouth daily. 12/15/20  Yes [name] F, FNP  levocetirizine (XYZAL) 5 MG tablet Take 5 mg by mouth daily as needed. 01/14/21  Yes [provider]  losartan (COZAAR) 50 MG tablet Take 1 tablet (50 mg total) by mouth every morning. 10/28/20  Yes [name] F, FNP  lovastatin  (MEVACOR) 40 MG tablet Take 1 tablet (40 mg total) by mouth at bedtime. 05/06/20  Yes Loman Brooklyn, FNP  metoprolol tartrate (LOPRESSOR) 50 MG tablet Take 1 tablet (50 mg total) by mouth 2 (two) times daily. 11/18/20  Yes Josue Hector, MD  Multiple Vitamin (MULTIVITAMIN WITH MINERALS) TABS tablet Take 1 tablet by mouth daily. One-A-Day for Men 50+   Yes [provider]  Multiple Vitamins-Minerals (PRESERVISION AREDS 2 PO) Take 1 tablet by mouth in the morning and at bedtime.   Yes [provider]  nitroGLYCERIN (NITROSTAT) 0.4 MG SL tablet PLACE 1 TABLET (0.4 MG TOTAL) UNDER THE TONGUE EVERY 5 (FIVE) MINUTES AS NEEDED FOR CHEST PAIN. 01/13/20  Yes Josue Hector, MD  Omega-3 Fatty Acids (FISH OIL) 1200 MG CAPS Take 2,400 mg by mouth daily.   Yes [provider]  omeprazole (PRILOSEC) 20 MG capsule Take 1 capsule (20 mg total) by mouth 2 (two) times daily before a meal. 10/30/20  Yes [name] F, FNP  Probiotic Product (Allen) Take 1 capsule by mouth daily as needed.   Yes [provider]  warfarin (COUMADIN) 5 MG tablet TAKE 1 TABLET EVERY DAY 12/02/20  Yes Loman Brooklyn, FNP  traMADol (ULTRAM) 50 MG tablet Take 0.5-1 tablets (25-50 mg total) by mouth 2 (two) times daily as needed. Patient not taking: Reported on 02/05/2021 10/28/20   Loman Brooklyn, FNP    Inpatient Medications: Scheduled Meds:  amiodarone  100 mg Oral Daily   Chlorhexidine Gluconate Cloth  6 each Topical Daily   furosemide  40 mg Intravenous Daily   melatonin  3 mg Oral QHS   metoprolol tartrate  50 mg Oral BID   pantoprazole  40 mg Oral Daily   potassium chloride  40 mEq Oral Q3H   pravastatin  40 mg Oral q1800   warfarin  5 mg Oral ONCE-1600   Warfarin - Pharmacist Dosing Inpatient   Does not apply q1600   Continuous Infusions:  PRN Meds: acetaminophen, docusate sodium, ondansetron (ZOFRAN) IV, polyethylene glycol  Allergies:    Allergies   Allergen Reactions   Doxycycline Diarrhea   Contrast Media [Iodinated Contrast Media]     Syncope   Lunesta [Eszopiclone]     "felt WILD"   Morphine And Related Nausea And Vomiting   Trazodone And Nefazodone Other (See Comments)    Extremely drowsy the next day after taking.    Social History:   Social History   Socioeconomic History   Marital status: Married    Spouse name: ruby   Number of children: 1   Years of education: Not on file   Highest education level: Not on file  Occupational History   Occupation: retired    Fish farm manager: [employer]  Tobacco Use   Smoking status: Former    Types: Cigarettes    Start date: 01/24/1950    Quit date: 01/24/1961    Years since quitting:  60.0   Smokeless tobacco: Never  Vaping Use   Vaping Use: Never used  Substance and Sexual Activity   Alcohol use: Not Currently    Alcohol/week: 0.0 standard drinks    Comment: not drank since 12/2009   Drug use: No   Sexual activity: Yes    Partners: Female  Other Topics Concern   Not on file  Social History Narrative   Not on file   Social Determinants of Health   Financial Resource Strain: Not on file  Food Insecurity: Not on file  Transportation Needs: Not on file  Physical Activity: Not on file  Stress: Not on file  Social Connections: Not on file  Intimate Partner Violence: Not on file    Family History:     Family History  Problem Relation Age of Onset   Heart disease Mother    Stroke Mother    Stroke Father    Diabetes Son    Stroke Son    Other Sister        ruptured appendix    Lung cancer Brother        asbestos   Mental illness Sister    Kidney disease Sister    Colon cancer Neg Hx        "not that I know of."     ROS:  Please see the history of present illness.  Review of Systems  Reason unable to perform ROS: patient confused.   All other ROS reviewed and negative.     Physical Exam/Data:   Vitals:   02/08/21 0500 02/08/21 0600 02/08/21 0700 02/08/21 0800   BP: 132/87 (!) 132/103 (!) 140/91 126/86  Pulse: 94 (!) 104 (!) 104 85  Resp: 17 (!) 21 16 (!) 21  Temp:   97.8 F (36.6 C)   TempSrc:   Oral   SpO2: 96% 96% 97% 97%  Weight: 87.3 kg     Height:        Intake/Output Summary (Last 24 hours) at 02/08/2021 0942 Last data filed at 02/08/2021 0640 Gross per 24 hour  Intake 240 ml  Output 1850 ml  Net -1610 ml   Last 3 Weights 02/08/2021 02/07/2021 02/06/2021  Weight (lbs) 192 lb 7.4 oz 188 lb 11.4 oz 183 lb 10.3 oz  Weight (kg) 87.3 kg 85.6 kg 83.3 kg     Body mass index is 24.71 kg/m.  General:  Well nourished, well developed, in no acute distress  HEENT: normal Neck: increased JVD Vascular: No carotid bruits; Distal pulses 2+ bilaterally Cardiac:  normal S1, S2; irreg with 3/6 harsh systolic murmur LSB Lungs: decreased breath sounds with scattered wheezing Abd: soft, nontender, no hepatomegaly  XWR:UEAVW ankle edema Musculoskeletal:  No deformities, BUE and BLE strength normal and equal Skin: warm and dry  Neuro:  confused Psych:  Normal affect   EKG:  The EKG was personally reviewed and demonstrates:  Afib 128/m with nonspecific ST changes. Telemetry:  Telemetry was personally reviewed and demonstrates:  Afib in low 100's  Relevant CV Studies: Echo 02/06/21 IMPRESSIONS     1. Left ventricular ejection fraction, by estimation, is 40-45%. The left  ventricle has mildly decreased function. The left ventricle demonstrates  regional wall motion abnormalities (abnormal septal motion). There is mild  concentric left ventricular  hypertrophy. Left ventricular diastolic parameters are indeterminate.  There is the interventricular septum is flattened in systole, consistent  with right ventricular pressure overload.   2. Right ventricular systolic function is mildly reduced. The  right  ventricular size is enlarged. There is moderately elevated pulmonary  artery systolic pressure. The estimated right ventricular systolic   pressure is 64.4 mmHg.   3. Right atrial size was moderately dilated.   4. The mitral valve is abnormal. No evidence of mitral valve  regurgitation. No evidence of mitral stenosis. The mean mitral valve  gradient is 3.0 mmHg with average heart rate of 119 bpm. Mitral valve area  by continuity equation 3.3 cm2.   5. Tricuspid valve regurgitation is severe.   6. The aortic valve is calcified. Aortic valve regurgitation is not  visualized. Moderate aortic valve stenosis. There is low flow low gradient  aortic stenosis, mean gradient 25 mm Hg but in the setting of decreased LV  stroke volume. DVI 0.39.   Comparison(s): Compared to 2021 study, LVEF and AS are worse; prior study  not done in the setting of this tachycardia.    L/R heart cath 02/11/20 Ost LAD to Prox LAD lesion is 100% stenosed. Ost LM to Mid LM lesion is 100% stenosed. Prox RCA to Mid RCA lesion is 25% stenosed. RPDA lesion is 35% stenosed. SVG graft was visualized by angiography and is normal in caliber. The graft exhibits no disease. RIMA graft was visualized by angiography. LIMA graft was visualized by angiography and is normal in caliber. The graft exhibits no disease. The left ventricular systolic function is normal. LV end diastolic pressure is normal. The left ventricular ejection fraction is 55-65% by visual estimate.   1. Left main ostial occlusion 2. Nonobstructive disease in the RCA 3. Patent LIMA to the LAD 4. Atretic RIMA to the ramus intermediate. The Ramus intermediate fills retrograde from the LCx graft 5. Patent SVG to the OM 6. The right subclavian bypass is widely patent 7. Normal right heart pressures 8. Normal LV filling pressures 9. Normal cardiac output.    Plan: continue medical management.   Laboratory Data:  High Sensitivity Troponin:   Recent Labs  Lab 02/05/21 1450 02/05/21 1609  TROPONINIHS 22* 22*     Chemistry Recent Labs  Lab 02/06/21 0525 02/06/21 0903 02/07/21 0430  02/08/21 0422  NA 126*  --  124* 125*  K 3.9  --  3.8 3.0*  CL 93*  --  92* 92*  CO2 22  --  22 24  GLUCOSE 99  --  109* 104*  BUN 21  --  17 21  CREATININE 0.99  --  0.80 0.88  CALCIUM 8.9  --  8.7* 8.1*  MG 1.8 1.8 1.7 1.6*  GFRNONAA >60  --  >60 >60  ANIONGAP 11  --  10 9    Recent Labs  Lab 02/05/21 1435 02/07/21 0430 02/08/21 0422  PROT 7.7 6.1* 5.7*  ALBUMIN 4.1 3.3* 3.0*  AST 51* 43* 36  ALT 34 27 26  ALKPHOS 198* 140* 133*  BILITOT 1.4* 1.7* 1.8*   Lipids No results for input(s): CHOL, TRIG, HDL, LABVLDL, LDLCALC, CHOLHDL in the last 168 hours.  Hematology Recent Labs  Lab 02/06/21 0525 02/07/21 0430 02/08/21 0422  WBC 8.8 8.8 7.7  RBC 4.18* 4.09* 3.99*  HGB 13.7 12.5* 12.3*  HCT 37.7* 37.0* 35.6*  MCV 90.2 90.5 89.2  MCH 32.8 30.6 30.8  MCHC 36.3* 33.8 34.6  RDW 15.3 15.0 14.9  PLT 186 168 171   Thyroid  Recent Labs  Lab 02/05/21 1353  TSH 0.838    BNP Recent Labs  Lab 02/06/21 0903  BNP 491.0*    DDimer  No results for input(s): DDIMER in the last 168 hours.   Radiology/Studies:  CT ABDOMEN PELVIS WO CONTRAST  Result Date: 02/06/2021 CLINICAL DATA:  Worsening abdominal pain, shortness of breath EXAM: CT ABDOMEN AND PELVIS WITHOUT CONTRAST TECHNIQUE: Multidetector CT imaging of the abdomen and pelvis was performed following the standard protocol without IV contrast.This exam was performed according to the departmental dose-optimization program which includes automated exposure control, adjustment of the mA and/or kV according to patient size and/or use of iterative reconstruction technique. COMPARISON:  09/19/2012 FINDINGS: Lower chest: Moderate right and small left pleural effusions. Partially calcified left pleural plaque. Dependent atelectasis/consolidation posteriorly in the lung bases right greater than left. Extensive coronary arterial calcifications. Hepatobiliary: No focal liver abnormality is seen. No gallstones, gallbladder wall  thickening, or biliary dilatation. Pancreas: Parenchymal atrophy. No mass, ductal dilatation, or regional inflammatory change. Spleen: Normal in size.  Multiple small calcified granulomas. Adrenals/Urinary Tract: No adrenal mass. No urolithiasis or hydronephrosis. Urinary bladder physiologically distended. Stomach/Bowel: Stomach and small bowel are nondistended. The colon is nondilated. A few scattered sigmoid diverticula without significant adjacent inflammatory change. Vascular/Lymphatic: Calcified atheromatous plaque in the abdominal aorta involving visceral and renal branches, without aneurysm. No abdominal or pelvic adenopathy. Reproductive: Prostate is unremarkable. Other: No ascites.  No free air. Musculoskeletal: Sternotomy wires. Spondylitic changes throughout the lumbar spine. IMPRESSION: 1. Moderate right and small left pleural effusions. 2. Coronary and Aortic Atherosclerosis (ICD10-170.0). 3. Sigmoid diverticulosis Electronically Signed   By: Lucrezia Europe M.D.   On: 02/06/2021 14:03   CT HEAD WO CONTRAST  Result Date: 02/05/2021 CLINICAL DATA:  Mental status change, unknown cause EXAM: CT HEAD WITHOUT CONTRAST TECHNIQUE: Contiguous axial images were obtained from the base of the skull through the vertex without intravenous contrast. RADIATION DOSE REDUCTION: This exam was performed according to the departmental dose-optimization program which includes automated exposure control, adjustment of the mA and/or kV according to patient size and/or use of iterative reconstruction technique. COMPARISON:  None. FINDINGS: Brain: No evidence of acute intracranial hemorrhage or extra-axial collection.No evidence of mass lesion/concern mass effect.The ventricles are normal in size.Scattered subcortical and periventricular white matter hypodensities, nonspecific but likely sequela of chronic small vessel ischemic disease. Vascular: No hyperdense vessel or unexpected calcification. Skull: Normal. Negative for  fracture or focal lesion. Sinuses/Orbits: There is mucosal thickening of the left maxillary sinus with frothy material. The orbits are unremarkable. Other: None IMPRESSION: No acute intracranial abnormality. Mild sequela of chronic small vessel ischemic disease. Electronically Signed   By: Maurine Simmering M.D.   On: 02/05/2021 15:12   US Venous Img Lower Bilateral (DVT)  Result Date: 02/06/2021 CLINICAL DATA:  increasing shortness of breath and dyspnea on exertion for the better part of a month which has worsened over the past week. increasing confusion for the past month which has significantly worsened over the past week prior to admission. decreased oral intake and increasing generalized weakness to the point where he was having difficulty getting out of bed for 2 days prior to this admission. No fevers, chills, chest pain, vomiting, diarrhea. nauseous and has had decreased oral intake. constipation and abdominal discomfort which appears to be chronic. Hyponatremia, leg cramping x1 day EXAM: BILATERAL LOWER EXTREMITY VENOUS DOPPLER ULTRASOUND TECHNIQUE: Gray-scale sonography with compression, as well as color and duplex ultrasound, were performed to evaluate the deep venous system(s) from the level of the common femoral vein through the popliteal and proximal calf veins. COMPARISON:  09/01/2006 right, by report only FINDINGS: VENOUS Normal  compressibility of the common femoral, superficial femoral, and popliteal veins, as well as the visualized calf veins. Visualized portions of profunda femoral vein and great saphenous vein unremarkable. No filling defects to suggest DVT on grayscale or color Doppler imaging. Doppler waveforms show normal direction of venous flow, normal respiratory phasicity and response to augmentation. OTHER None. Limitations: none IMPRESSION: No femoropopliteal DVT nor evidence of DVT within the visualized calf veins. If clinical symptoms are inconsistent or if there are persistent or  worsening symptoms, further imaging (possibly involving the iliac veins) may be warranted. Electronically Signed   By: Lucrezia Europe M.D.   On: 02/06/2021 13:38   DG Chest Port 1 View  Result Date: 02/05/2021 CLINICAL DATA:  Atrial fibrillation with rapid ventricular response EXAM: PORTABLE CHEST 1 VIEW COMPARISON:  Portable exam 1343 hours compared to 04/07/2016 FINDINGS: Enlargement of cardiac silhouette post CABG. Mild pulmonary vascular congestion. Atherosclerotic calcification aorta. RIGHT pleural effusion and basilar atelectasis. Remaining lungs clear. No definite infiltrate or pneumothorax. Bones demineralized. IMPRESSION: Enlargement of cardiac silhouette with pulmonary vascular congestion post CABG. New RIGHT pleural effusion and basilar atelectasis of unknown etiology; follow-up radiographs until resolution recommended to exclude underlying abnormalities. Aortic Atherosclerosis (ICD10-I70.0). Electronically Signed   By: Lavonia Dana M.D.   On: 02/05/2021 13:57   ECHOCARDIOGRAM COMPLETE  Result Date: 02/06/2021    ECHOCARDIOGRAM REPORT   Patient Name:   Jentry Warnell. Date of Exam: 02/06/2021 Medical Rec #:  [id number]         Height:       74.0 in Accession #:    [id number]        Weight:       183.6 lb Date of Birth:  Jan 24, 1932         BSA:          2.096 m Patient Age:    5 years          BP:           155/85 mmHg Patient Gender: M                 HR:           93 bpm. Exam Location:  Inpatient Procedure: 2D Echo, 3D Echo, Cardiac Doppler, Color Doppler and Intracardiac            Opacification Agent Indications:    I48.91* Unspeicified atrial fibrillation; I10 Hypertension  History:        Patient has prior history of Echocardiogram examinations. CAD,                 Abnormal ECG and Prior CABG, Pulmonary HTN, Arrythmias:Atrial                 Fibrillation, Signs/Symptoms:Edema, Shortness of Breath, Dyspnea                 and Altered Mental Status; Risk Factors:Hypertension,                  Dyslipidemia and Former Smoker.  Sonographer:    Roseanna Rainbow RDCS Referring Phys: [phone] DAVID TAT  Sonographer Comments: Technically difficult study due to poor echo windows. Patient could not follow instructions. IMPRESSIONS  1. Left ventricular ejection fraction, by estimation, is 40-45%. The left ventricle has mildly decreased function. The left ventricle demonstrates regional wall motion abnormalities (abnormal septal motion). There is mild concentric left ventricular hypertrophy. Left ventricular diastolic parameters are indeterminate. There is the interventricular septum is flattened  in systole, consistent with right ventricular pressure overload.  2. Right ventricular systolic function is mildly reduced. The right ventricular size is enlarged. There is moderately elevated pulmonary artery systolic pressure. The estimated right ventricular systolic pressure is 01.7 mmHg.  3. Right atrial size was moderately dilated.  4. The mitral valve is abnormal. No evidence of mitral valve regurgitation. No evidence of mitral stenosis. The mean mitral valve gradient is 3.0 mmHg with average heart rate of 119 bpm. Mitral valve area by continuity equation 3.3 cm2.  5. Tricuspid valve regurgitation is severe.  6. The aortic valve is calcified. Aortic valve regurgitation is not visualized. Moderate aortic valve stenosis. There is low flow low gradient aortic stenosis, mean gradient 25 mm Hg but in the setting of decreased LV stroke volume. DVI 0.39. Comparison(s): Compared to 2021 study, LVEF and AS are worse; prior study not done in the setting of this tachycardia. FINDINGS  Left Ventricle: Left ventricular ejection fraction, by estimation, is 40 to 45%. The left ventricle has mildly decreased function. The left ventricle demonstrates regional wall motion abnormalities. Definity contrast agent was given IV to delineate the left ventricular endocardial borders. The left ventricular internal cavity size was normal in size. There  is mild concentric left ventricular hypertrophy. Abnormal (paradoxical) septal motion consistent with post-operative status and the interventricular  septum is flattened in systole, consistent with right ventricular pressure overload. Left ventricular diastolic parameters are indeterminate. Right Ventricle: The right ventricular size is mildly enlarged. No increase in right ventricular wall thickness. Right ventricular systolic function is mildly reduced. There is moderately elevated pulmonary artery systolic pressure. The tricuspid regurgitant velocity is 3.08 m/s, and with an assumed right atrial pressure of 15 mmHg, the estimated right ventricular systolic pressure is 51.0 mmHg. Left Atrium: Left atrial size was normal in size. Right Atrium: Right atrial size was moderately dilated. Pericardium: There is no evidence of pericardial effusion. Mitral Valve: The mitral valve is abnormal. No evidence of mitral valve regurgitation. No evidence of mitral valve stenosis. MV peak gradient, 9.2 mmHg. The mean mitral valve gradient is 3.0 mmHg with average heart rate of 119 bpm. Tricuspid Valve: The tricuspid valve is normal in structure. Tricuspid valve regurgitation is severe. Aortic Valve: The aortic valve is calcified. Aortic valve regurgitation is not visualized. Moderate aortic stenosis is present. Aortic valve mean gradient measures 25.0 mmHg. Aortic valve peak gradient measures 39.1 mmHg. Aortic valve area, by VTI measures 0.94 cm. Pulmonic Valve: The pulmonic valve was not well visualized. Pulmonic valve regurgitation is not visualized. No evidence of pulmonic stenosis. Aorta: The aortic root and ascending aorta are structurally normal, with no evidence of dilitation. IAS/Shunts: No atrial level shunt detected by color flow Doppler.  LEFT VENTRICLE PLAX 2D LVIDd:         4.70 cm LVIDs:         3.80 cm LV PW:         1.30 cm LV IVS:        1.20 cm LVOT diam:     2.00 cm LV SV:         62 LV SV Index:   29 LVOT  Area:     3.14 cm  LV Volumes (MOD) LV vol d, MOD A2C: 60.4 ml LV vol d, MOD A4C: 87.5 ml LV vol s, MOD A2C: 36.0 ml LV vol s, MOD A4C: 49.0 ml LV SV MOD A2C:     24.4 ml LV SV MOD A4C:  87.5 ml LV SV MOD BP:      33.2 ml RIGHT VENTRICLE            IVC RV S prime:     6.98 cm/s  IVC diam: 3.50 cm TAPSE (M-mode): 0.8 cm LEFT ATRIUM             Index        RIGHT ATRIUM           Index LA diam:        3.95 cm 1.88 cm/m   RA Area:     29.50 cm LA Vol (A2C):   59.5 ml 28.39 ml/m  RA Volume:   94.40 ml  45.04 ml/m LA Vol (A4C):   55.9 ml 26.67 ml/m LA Biplane Vol: 61.5 ml 29.34 ml/m  AORTIC VALVE AV Area (Vmax):    1.30 cm AV Area (Vmean):   1.16 cm AV Area (VTI):     0.94 cm AV Vmax:           312.60 cm/s AV Vmean:          219.000 cm/s AV VTI:            0.656 m AV Peak Grad:      39.1 mmHg AV Mean Grad:      25.0 mmHg LVOT Vmax:         129.00 cm/s LVOT Vmean:        80.800 cm/s LVOT VTI:          0.196 m LVOT/AV VTI ratio: 0.30  AORTA Ao Root diam: 3.40 cm Ao Asc diam:  3.20 cm MITRAL VALVE              TRICUSPID VALVE MV Area (PHT): 3.91 cm   TR Peak grad:   37.9 mmHg MV Area VTI:   3.17 cm   TR Vmax:        308.00 cm/s MV Peak grad:  9.2 mmHg MV Mean grad:  3.0 mmHg   SHUNTS MV Vmax:       1.52 m/s   Systemic VTI:  0.20 m MV Vmean:      76.9 cm/s  Systemic Diam: 2.00 cm MV Decel Time: 194 msec Rudean Haskell MD Electronically signed by Rudean Haskell MD Signature Date/Time: 02/06/2021/2:36:02 PM    Final      Assessment and Plan:   Afib with RVR in the setting of acute metabolic encephalopathy, hypokalemia, hyponatremia, low Mg. INR 6.5 on admission received Vit K INR 2.5. on Amio as outpatient and has persistent Afib on amio and metoprolol for rate control as OP. IV metoprolol switched to po today.   Cardiomyopathy EF 40-45% with increased right heart pressures and severe TR/mod AS on echo 02/06/21 done in setting Afib with RVR.   Acute on chronic combined systolic and diastolic  CHF-I/O's negative 604 since admission but negative 2.5 since yest. Needs continued diuresis. Na 125, K 3.0, Mg 1.6 today. K Mg being replaced. Lasix 40 mg IV ordered today.  Severe TR/mod AS on echo  CAD CABG 2003, cath 01/2020 medical management  HTN BP has been running high  HLD on pravachol  PVD S/P LCEA w R subclavian bpg [id number]  Metabolic encephalopathy per primary care team   Risk Assessment/Risk Scores:        New [name] Heart Association (NYHA) Functional Class NYHA Class III  CHA2DS2-VASc Score = 5   This indicates a 7.2% annual risk of stroke. The patient's score is  based upon: CHF History: 1 HTN History: 1 Diabetes History: 0 Stroke History: 0 Vascular Disease History: 1 Age Score: 2 Gender Score: 0         For questions or updates, please contact [facility] Please consult [email or web address] for contact info under    Signed, Ermalinda Barrios, PA-C  02/08/2021 9:42 AM   Patient seen and discussed with PA Bonnell Public, I agree with her documentation. 88 yo male history of PAD, carotid stenosis with left CEA and carotid to subclavian bypass on the right, CAD with prior CABG in 2003  LIMA-LAD, RIMA-RI, SVG-OM3, PAF on coumadin due to cost, HTN, HL, pulmonary HTN with RV failure, mild aortic stenosis, admitted with altered mental status               Admit labs Mg 1.9 WBC 7.9 Hgb 14.4 Plt 188 K 4.6 Cr 1.27 TSH 0.838 INR 6.5 Na 120 BNP 491 COVID neg Flu neg Trop 22-->22 VBG: 7.4/34 Blood cx's neg Lactic acid 2.1--> Procalc <0.10 EKG afib , RVR   CXR: pulm congestion, right pleural effusion CT head: no acute process LE venous US: no DVT CT A/P: mod right pleural effusion, mild left   Jan 2023 echo: LVE 40-45%, mild RV dysfunction, enlarged RV, PASP 53. AVA 0.94   Jan 2022 cath: LM occluded, LAD occluded, prox RCA 25%, RPDA 35%. SVG-OM1 normal, RIMA-RAMUS atretic, LIMA-LAD normal RHC: CI 2.8 Mean PA 20 PCWP 12       1.Afib, longstanding  persistent --has been on amiodarone - from Dr Kyla Balzarine clinic note patient turned down DCCV on amio, has stayed on amio for rate control   -supratherapeutic INR on admission, now subtherapeutic - he reports would consider DCCV if felt would be of benefit now. With subtherepeutic INR and controled rates no indication for inpatient DCCV, defer ongoing discussions with his primary cardiologist. With drop in LVEF may be worth attempt.    - continue oral amio, can change IV lopressor back to his home lopressor 50mg  bid. Rates reasonabel 80s to low 100s, monitor with med change   2.Aortic stenosis - Jan 2023 echo: mean grad 25, AVA VTI 0.94 DI 0.30, SVI 29 - majority of criteria support moderate AS, continue to monitor at this time.    3. Acute systolic HF - Jan 8657 echo: LVE 40-45%, mild RV dysfunction, enlarged RV, PASP 53. AVA - cath just last year with patent grafts - perhaps tachy mediacted mild LV dysfunction - initially received IVFs due to hyponatremia.   - will consolidated to toprol prior to discharge. Home losartan on hold as unclear if contirbuted to low sodium, continue to monitor Na at this time.    - agree with IV lasix today. Recheck CXR, BNP. Pulsatile right neck I think is more related to his prior carotid bypass, he also has severe TR which affects interpretation of JVD exam.      4.Hyponatremia - Na 120 on admission   5. Hypokalemia - received oral replacement.      Carlyle Dolly MD

## 2021-02-08 NOTE — Progress Notes (Signed)
Patient admitted for acute hyponatremia. Recommended for HHPT. Discussed HH options. Referred and accepted by [facility] with Alvis Lemmings.    Danny [name], Clydene Pugh, LCSW

## 2021-02-08 NOTE — Progress Notes (Deleted)
Patient seen and discussed with PA Bonnell Public, I agree with her documentation. 88 yo male history of PAD, carotid stenosis with left CEA and carotid to subclavian bypass on the right, CAD with prior CABG in 2003  LIMA-LAD, RIMA-RI, SVG-OM3, PAF on coumadin due to cost, HTN, HL, pulmonary HTN with RV failure, mild aortic stenosis, admitted with altered mental status        Admit labs Mg 1.9 WBC 7.9 Hgb 14.4 Plt 188 K 4.6 Cr 1.27 TSH 0.838 INR 6.5 Na 120 BNP 491 COVID neg Flu neg Trop 22-->22 VBG: 7.4/34 Blood cx's neg Lactic acid 2.1--> Procalc <0.10 EKG afib , RVR  CXR: pulm congestion, right pleural effusion CT head: no acute process LE venous US: no DVT CT A/P: mod right pleural effusion, mild left  Jan 2023 echo: LVE 40-45%, mild RV dysfunction, enlarged RV, PASP 53. AVA 0.94  Jan 2022 cath: LM occluded, LAD occluded, prox RCA 25%, RPDA 35%. SVG-OM1 normal, RIMA-RAMUS atretic, LIMA-LAD normal RHC: CI 2.8 Mean PA 20 PCWP 12    1.Afib, longstanding persistent --has been on amiodarone - from Dr Kyla Balzarine clinic note patient turned down DCCV on amio, has stayed on amio for rate control  -supratherapeutic INR on admission, now subtherapeutic - he reports would consider DCCV if felt would be of benefit now. With subtherepeutic INR and controled rates no indication for inpatient DCCV, defer ongoing discussions with his primary cardiologist. With drop in LVEF may be worth attempt.   - continue oral amio, can change IV lopressor back to his home lopressor 50mg  bid. Rates reasonabel 80s to low 100s, monitor with med change  2.Aortic stenosis - Jan 2023 echo: mean grad 25, AVA VTI 0.94 DI 0.30, SVI 29 - majority of criteria support moderate AS, continue to monitor at this time.   3. Acute systolic HF - Jan 6861 echo: LVE 40-45%, mild RV dysfunction, enlarged RV, PASP 53. AVA - cath just last year with patent grafts - perhaps tachy mediacted mild LV dysfunction - initially received  IVFs due to hyponatremia.  - will consolidated to toprol prior to discharge. Home losartan on hold as unclear if contirbuted to low sodium, continue to monitor Na at this time.   - agree with IV lasix today. Recheck CXR, BNP. Pulsatile right neck I think is more related to his prior carotid bypass as opposed to markedly elevated JVD.   4.Hyponatremia - Na 120 on admission  5. Hypokalemia - received oral replacement.    Carlyle Dolly MD

## 2021-02-08 NOTE — Progress Notes (Signed)
PROGRESS NOTE  Danny [name]. QJF:[id number] DOB: September 19, 1931 DOA: 02/05/2021 PCP: Loman Brooklyn, FNP   Brief History:  88 year old male with a history of peripheral vascular disease, left CEA, coronary disease status post CABG 2003, hypertension, hyperlipidemia, persistent atrial fibrillation on warfarin, anxiety presenting with altered mental status, generalized weakness, dyspnea, and poor p.o. intake.  The patient is a poor historian.  History is supplemented from the patient's spouse and niece.  Apparently, the patient has had increasing shortness of breath and dyspnea on exertion for the better part of a month which has worsened over the past week.  In addition, the patient has had increasing confusion for the past month which has significantly worsened over the past week prior to admission.  He has had decreased oral intake and increasing generalized weakness to the point where he was having difficulty getting out of bed for 2 days prior to this admission.  There was no particular complaints of fevers, chills, chest pain, vomiting, diarrhea.  Patient has been nauseous and has had decreased oral intake.  Complains of some constipation and abdominal discomfort which appears to be chronic. Has not been any changes in his medications for at least a year.  The patient has been resistant to follow-up with his medical providers.  In the ED, the patient was afebrile and hemodynamically stable.  Patient was in atrial fibrillation with RVR with heart rates 130-150.  Oxygen saturation was 90% on 2 L.  BMP shows sodium 120, potassium 4.2, bicarbonate 20, serum creatinine 1.27.  WBC 7.9, hemoglobin 14.4, platelets 188,000.  AST 51, ALT 34, alk phosphatase 198, total bilirubin 1.4.   Assessment/Plan:   Hyponatremia -Likely multifactorial but primarily driven by poor solute intake and volume depletion>>now fluid overloaed -initially fluid resuscitated -1/15--appears hypervolemic>>lasix IV x  1 -1/16--repeat lasix -There is a component of chronicity with sodium ranging 125-130 -FeNa 0.93% -Urine osmolarity--468 -Serum osmolarity--264   Persistent atrial fibrillation with RVR -Changed IV Lopressor 5 mg IV every 6 -Continue amiodarone>>start amio drip if HR remains poorly controlled -The patient has refused ablation in the past -Presented with supratherapeutic INR 6.5>>2.5 after vit K -Echo EF 40-45%, +moderate AS, RVSP 89.3  Acute systolic CHF -continue IV lasix -02/06/21 echo--EF 40-45%, mod AS, RVSP 52.9, mod decrease RV -severe TR -normal R-hear pressures on 02/10/21 cath -consult cardiology   Supratherapeutic INR -No signs of bleeding -Given vitamin K 02/05/2021 -Repeat INR 2.4 -restart warfarin   Acute metabolic encephalopathy -Obtain UA--no pyuria -T34--2876 -Folic OTLX--72.6 -TSH 2.035 -CT brain negative for acute findings -1/16--back to baseline   Elevated lactate -Blood cultures x2 sets--neg to date -Check PCT <0.10 -UA --no pyuria   Lower extremity pain and edema -Venous duplex--neg   Coronary artery disease -No chest pain presently -1/18.23 cath---Left main ostial occlusion; Nonobstructive disease in the RCA; patent grafts -Troponin 22>> 22 -Personally reviewed EKG--atrial fibrillation, nonspecific ST-T wave change   Abdominal pain -CT abd/pelv--no acute findings; bilateral pleural effusions  R>L -improved -tolerating diet  Hypokalemia/Hypomagnesemia -replete   Goals of Care -Advance care planning, including the explanation and discussion of advance directives was carried out with the patient and family.  Code status including explanations of "Full Code" and "DNR" and alternatives were discussed in detail.  Discussion of end-of-life issues including but not limited palliative care, hospice care and the concept of hospice, other end-of-life care options, power of attorney for health care decisions, living wills, and physician orders  for  life-sustaining treatment were also discussed with the patient and family.  Total face to face time 16 minutes. -confirmed DNR with spouse and niece                   Family Communication:  niece and spouse updated 1/16   Consultants:  cardiology   Code Status:  DNR   DVT Prophylaxis:  coumadin     Procedures: As Listed in Progress Note Above   Antibiotics: None      Subjective: Patient denies fevers, chills, headache, chest pain, dyspnea, nausea, vomiting, diarrhea, abdominal pain, dysuria, hematuria, hematochezia, and melena.   Objective: Vitals:   02/08/21 0500 02/08/21 0600 02/08/21 0700 02/08/21 0800  BP: 132/87 (!) 132/103 (!) 140/91 126/86  Pulse: 94 (!) 104 (!) 104 85  Resp: 17 (!) 21 16 (!) 21  Temp:   97.8 F (36.6 C)   TempSrc:   Oral   SpO2: 96% 96% 97% 97%  Weight: 87.3 kg     Height:        Intake/Output Summary (Last 24 hours) at 02/08/2021 0845 Last data filed at 02/08/2021 0640 Gross per 24 hour  Intake 240 ml  Output 1850 ml  Net -1610 ml   Weight change: 1.7 kg Exam:  General:  Pt is alert, follows commands appropriately, not in acute distress HEENT: No icterus, No thrush, No neck mass, [place]/AT Cardiovascular: RRR, S1/S2, no rubs, no gallops Respiratory: bibasilar crackles.  No wheeze Abdomen: Soft/+BS, non tender, non distended, no guarding Extremities: trace LE edema, No lymphangitis, No petechiae, No rashes, no synovitis   Data Reviewed: I have personally reviewed following labs and imaging studies Basic Metabolic Panel: Recent Labs  Lab 02/05/21 1353 02/05/21 1435 02/05/21 1618 02/05/21 1819 02/06/21 0525 02/06/21 0903 02/07/21 0430 02/08/21 0422  NA 120* 120* 120* 123* 126*  --  124* 125*  K 4.6 4.2  --   --  3.9  --  3.8 3.0*  CL 87* 86*  --   --  93*  --  92* 92*  CO2 21* 20*  --   --  22  --  22 24  GLUCOSE 111* 111*  --   --  99  --  109* 104*  BUN 28* 27*  --   --  21  --  17 21  CREATININE 1.27* 1.22  --    --  0.99  --  0.80 0.88  CALCIUM 8.9 9.1  --   --  8.9  --  8.7* 8.1*  MG 1.9  --   --   --  1.8 1.8 1.7 1.6*   Liver Function Tests: Recent Labs  Lab 02/05/21 1435 02/07/21 0430 02/08/21 0422  AST 51* 43* 36  ALT 34 27 26  ALKPHOS 198* 140* 133*  BILITOT 1.4* 1.7* 1.8*  PROT 7.7 6.1* 5.7*  ALBUMIN 4.1 3.3* 3.0*   Recent Labs  Lab 02/06/21 0903  LIPASE 36   Recent Labs  Lab 02/05/21 1435  AMMONIA 22   Coagulation Profile: Recent Labs  Lab 02/05/21 1353 02/06/21 0525 02/06/21 0903 02/07/21 0430 02/08/21 0422  INR 6.5* 4.8* 4.2* 2.4* 1.7*   CBC: Recent Labs  Lab 02/05/21 1353 02/06/21 0525 02/07/21 0430 02/08/21 0422  WBC 7.9 8.8 8.8 7.7  HGB 14.4 13.7 12.5* 12.3*  HCT 40.0 37.7* 37.0* 35.6*  MCV 89.3 90.2 90.5 89.2  PLT 188 186 168 171   Cardiac Enzymes: No results for input(s): CKTOTAL, CKMB, CKMBINDEX,  TROPONINI in the last 168 hours. BNP: Invalid input(s): POCBNP CBG: Recent Labs  Lab 02/05/21 1338  GLUCAP 104*   HbA1C: No results for input(s): HGBA1C in the last 72 hours. Urine analysis:    Component Value Date/Time   COLORURINE YELLOW 02/06/2021 [place] 02/06/2021 1047   APPEARANCEUR Clear 05/05/2017 1346   LABSPEC 1.020 02/06/2021 1047   PHURINE 5.5 02/06/2021 1047   GLUCOSEU NEGATIVE 02/06/2021 1047   HGBUR NEGATIVE 02/06/2021 [place] 02/06/2021 1047   BILIRUBINUR Negative 05/05/2017 1346   KETONESUR NEGATIVE 02/06/2021 1047   PROTEINUR NEGATIVE 02/06/2021 1047   UROBILINOGEN negative 02/11/2015 1142   UROBILINOGEN 0.2 09/19/2012 2034   NITRITE NEGATIVE 02/06/2021 1047   LEUKOCYTESUR NEGATIVE 02/06/2021 1047   Sepsis Labs: @LABRCNTIP (procalcitonin:4,lacticidven:4) ) Recent Results (from the past 240 hour(s))  Resp Panel by RT-PCR (Flu A&B, Covid) Nasopharyngeal Swab     Status: None   Collection Time: 02/05/21  2:09 PM   Specimen: Nasopharyngeal Swab; Nasopharyngeal(NP) swabs in vial  transport medium  Result Value Ref Range Status   SARS Coronavirus 2 by RT PCR NEGATIVE NEGATIVE Final    Comment: (NOTE) SARS-CoV-2 target nucleic acids are NOT DETECTED.  The SARS-CoV-2 RNA is generally detectable in upper respiratory specimens during the acute phase of infection. The lowest concentration of SARS-CoV-2 viral copies this assay can detect is 138 copies/mL. A negative result does not preclude SARS-Cov-2 infection and should not be used as the sole basis for treatment or other patient management decisions. A negative result may occur with  improper specimen collection/handling, submission of specimen other than nasopharyngeal swab, presence of viral mutation(s) within the areas targeted by this assay, and inadequate number of viral copies(<138 copies/mL). A negative result must be combined with clinical observations, patient history, and epidemiological information. The expected result is Negative.  Fact Sheet for Patients:  EntrepreneurPulse.com.au  Fact Sheet for Healthcare Providers:  IncredibleEmployment.be  This test is no t yet approved or cleared by the [place] FDA and  has been authorized for detection and/or diagnosis of SARS-CoV-2 by FDA under an Emergency Use Authorization (EUA). This EUA will remain  in effect (meaning this test can be used) for the duration of the COVID-19 declaration under Section 564(b)(1) of the Act, 21 U.S.C.section 360bbb-3(b)(1), unless the authorization is terminated  or revoked sooner.       Influenza A by PCR NEGATIVE NEGATIVE Final   Influenza B by PCR NEGATIVE NEGATIVE Final    Comment: (NOTE) The Xpert Xpress SARS-CoV-2/FLU/RSV plus assay is intended as an aid in the diagnosis of influenza from Nasopharyngeal swab specimens and should not be used as a sole basis for treatment. Nasal washings and aspirates are unacceptable for Xpert Xpress SARS-CoV-2/FLU/RSV testing.  Fact  Sheet for Patients: EntrepreneurPulse.com.au  Fact Sheet for Healthcare Providers: IncredibleEmployment.be  This test is not yet approved or cleared by the [place] FDA and has been authorized for detection and/or diagnosis of SARS-CoV-2 by FDA under an Emergency Use Authorization (EUA). This EUA will remain in effect (meaning this test can be used) for the duration of the COVID-19 declaration under Section 564(b)(1) of the Act, 21 U.S.C. section 360bbb-3(b)(1), unless the authorization is terminated or revoked.  Performed at [facility], [address] [place], [place] [address]   Blood Cultures (routine x 2)     Status: None (Preliminary result)   Collection Time: 02/05/21  2:35 PM   Specimen: BLOOD RIGHT ARM  Result Value  Ref Range Status   Specimen Description   Final    BLOOD RIGHT ARM BOTTLES DRAWN AEROBIC AND ANAEROBIC   Special Requests Blood Culture adequate volume  Final   Culture   Final    NO GROWTH 2 DAYS Performed at [facility], [address], [place], [place] [address]    Report Status PENDING  Incomplete  Blood Cultures (routine x 2)     Status: None (Preliminary result)   Collection Time: 02/05/21  2:35 PM   Specimen: BLOOD LEFT ARM  Result Value Ref Range Status   Specimen Description BLOOD LEFT ARM BOTTLES DRAWN AEROBIC AND ANAEROBIC  Final   Special Requests Blood Culture adequate volume  Final   Culture   Final    NO GROWTH 2 DAYS Performed at [facility], [address], [place], [place] [address]    Report Status PENDING  Incomplete  MRSA Next Gen by PCR, Nasal     Status: None   Collection Time: 02/05/21 10:05 PM   Specimen: Nasal Mucosa; Nasal Swab  Result Value Ref Range Status   MRSA by PCR Next Gen NOT DETECTED NOT DETECTED Final    Comment: (NOTE) The GeneXpert MRSA Assay (FDA approved for NASAL specimens only), is one component of a comprehensive MRSA colonization surveillance program. It is not  intended to diagnose MRSA infection nor to guide or monitor treatment for MRSA infections. Test performance is not FDA approved in patients less than 67 years old. Performed at [facility], [address] [place], [place] [address]      Scheduled Meds:  amiodarone  100 mg Oral Daily   Chlorhexidine Gluconate Cloth  6 each Topical Daily   furosemide  40 mg Intravenous Once   furosemide  20 mg Oral Daily   melatonin  3 mg Oral QHS   metoprolol tartrate  50 mg Oral BID   pantoprazole  40 mg Oral Daily   potassium chloride  40 mEq Oral Q3H   pravastatin  40 mg Oral q1800   warfarin  5 mg Oral ONCE-1600   Warfarin - Pharmacist Dosing Inpatient   Does not apply q1600   Continuous Infusions:  Procedures/Studies: CT ABDOMEN PELVIS WO CONTRAST  Result Date: 02/06/2021 CLINICAL DATA:  Worsening abdominal pain, shortness of breath EXAM: CT ABDOMEN AND PELVIS WITHOUT CONTRAST TECHNIQUE: Multidetector CT imaging of the abdomen and pelvis was performed following the standard protocol without IV contrast.This exam was performed according to the departmental dose-optimization program which includes automated exposure control, adjustment of the mA and/or kV according to patient size and/or use of iterative reconstruction technique. COMPARISON:  09/19/2012 FINDINGS: Lower chest: Moderate right and small left pleural effusions. Partially calcified left pleural plaque. Dependent atelectasis/consolidation posteriorly in the lung bases right greater than left. Extensive coronary arterial calcifications. Hepatobiliary: No focal liver abnormality is seen. No gallstones, gallbladder wall thickening, or biliary dilatation. Pancreas: Parenchymal atrophy. No mass, ductal dilatation, or regional inflammatory change. Spleen: Normal in size.  Multiple small calcified granulomas. Adrenals/Urinary Tract: No adrenal mass. No urolithiasis or hydronephrosis. Urinary bladder physiologically distended. Stomach/Bowel: Stomach  and small bowel are nondistended. The colon is nondilated. A few scattered sigmoid diverticula without significant adjacent inflammatory change. Vascular/Lymphatic: Calcified atheromatous plaque in the abdominal aorta involving visceral and renal branches, without aneurysm. No abdominal or pelvic adenopathy. Reproductive: Prostate is unremarkable. Other: No ascites.  No free air. Musculoskeletal: Sternotomy wires. Spondylitic changes throughout the lumbar spine. IMPRESSION: 1. Moderate right and small left pleural effusions. 2.  Coronary and Aortic Atherosclerosis (ICD10-170.0). 3. Sigmoid diverticulosis Electronically Signed   By: Lucrezia Europe M.D.   On: 02/06/2021 14:03   CT HEAD WO CONTRAST  Result Date: 02/05/2021 CLINICAL DATA:  Mental status change, unknown cause EXAM: CT HEAD WITHOUT CONTRAST TECHNIQUE: Contiguous axial images were obtained from the base of the skull through the vertex without intravenous contrast. RADIATION DOSE REDUCTION: This exam was performed according to the departmental dose-optimization program which includes automated exposure control, adjustment of the mA and/or kV according to patient size and/or use of iterative reconstruction technique. COMPARISON:  None. FINDINGS: Brain: No evidence of acute intracranial hemorrhage or extra-axial collection.No evidence of mass lesion/concern mass effect.The ventricles are normal in size.Scattered subcortical and periventricular white matter hypodensities, nonspecific but likely sequela of chronic small vessel ischemic disease. Vascular: No hyperdense vessel or unexpected calcification. Skull: Normal. Negative for fracture or focal lesion. Sinuses/Orbits: There is mucosal thickening of the left maxillary sinus with frothy material. The orbits are unremarkable. Other: None IMPRESSION: No acute intracranial abnormality. Mild sequela of chronic small vessel ischemic disease. Electronically Signed   By: Maurine Simmering M.D.   On: 02/05/2021 15:12   US  Venous Img Lower Bilateral (DVT)  Result Date: 02/06/2021 CLINICAL DATA:  increasing shortness of breath and dyspnea on exertion for the better part of a month which has worsened over the past week. increasing confusion for the past month which has significantly worsened over the past week prior to admission. decreased oral intake and increasing generalized weakness to the point where he was having difficulty getting out of bed for 2 days prior to this admission. No fevers, chills, chest pain, vomiting, diarrhea. nauseous and has had decreased oral intake. constipation and abdominal discomfort which appears to be chronic. Hyponatremia, leg cramping x1 day EXAM: BILATERAL LOWER EXTREMITY VENOUS DOPPLER ULTRASOUND TECHNIQUE: Gray-scale sonography with compression, as well as color and duplex ultrasound, were performed to evaluate the deep venous system(s) from the level of the common femoral vein through the popliteal and proximal calf veins. COMPARISON:  09/01/2006 right, by report only FINDINGS: VENOUS Normal compressibility of the common femoral, superficial femoral, and popliteal veins, as well as the visualized calf veins. Visualized portions of profunda femoral vein and great saphenous vein unremarkable. No filling defects to suggest DVT on grayscale or color Doppler imaging. Doppler waveforms show normal direction of venous flow, normal respiratory phasicity and response to augmentation. OTHER None. Limitations: none IMPRESSION: No femoropopliteal DVT nor evidence of DVT within the visualized calf veins. If clinical symptoms are inconsistent or if there are persistent or worsening symptoms, further imaging (possibly involving the iliac veins) may be warranted. Electronically Signed   By: Lucrezia Europe M.D.   On: 02/06/2021 13:38   DG Chest Port 1 View  Result Date: 02/05/2021 CLINICAL DATA:  Atrial fibrillation with rapid ventricular response EXAM: PORTABLE CHEST 1 VIEW COMPARISON:  Portable exam 1343 hours  compared to 04/07/2016 FINDINGS: Enlargement of cardiac silhouette post CABG. Mild pulmonary vascular congestion. Atherosclerotic calcification aorta. RIGHT pleural effusion and basilar atelectasis. Remaining lungs clear. No definite infiltrate or pneumothorax. Bones demineralized. IMPRESSION: Enlargement of cardiac silhouette with pulmonary vascular congestion post CABG. New RIGHT pleural effusion and basilar atelectasis of unknown etiology; follow-up radiographs until resolution recommended to exclude underlying abnormalities. Aortic Atherosclerosis (ICD10-I70.0). Electronically Signed   By: Lavonia Dana M.D.   On: 02/05/2021 13:57   ECHOCARDIOGRAM COMPLETE  Result Date: 02/06/2021    ECHOCARDIOGRAM REPORT   Patient Name:  Danny [name]. Date of Exam: 02/06/2021 Medical Rec #:  [id number]         Height:       74.0 in Accession #:    [id number]        Weight:       183.6 lb Date of Birth:  1931-12-24         BSA:          2.096 m Patient Age:    48 years          BP:           155/85 mmHg Patient Gender: M                 HR:           93 bpm. Exam Location:  Inpatient Procedure: 2D Echo, 3D Echo, Cardiac Doppler, Color Doppler and Intracardiac            Opacification Agent Indications:    I48.91* Unspeicified atrial fibrillation; I10 Hypertension  History:        Patient has prior history of Echocardiogram examinations. CAD,                 Abnormal ECG and Prior CABG, Pulmonary HTN, Arrythmias:Atrial                 Fibrillation, Signs/Symptoms:Edema, Shortness of Breath, Dyspnea                 and Altered Mental Status; Risk Factors:Hypertension,                 Dyslipidemia and Former Smoker.  Sonographer:    Roseanna Rainbow RDCS Referring Phys: [phone] Serene Kopf  Sonographer Comments: Technically difficult study due to poor echo windows. Patient could not follow instructions. IMPRESSIONS  1. Left ventricular ejection fraction, by estimation, is 40-45%. The left ventricle has mildly decreased function. The left  ventricle demonstrates regional wall motion abnormalities (abnormal septal motion). There is mild concentric left ventricular hypertrophy. Left ventricular diastolic parameters are indeterminate. There is the interventricular septum is flattened in systole, consistent with right ventricular pressure overload.  2. Right ventricular systolic function is mildly reduced. The right ventricular size is enlarged. There is moderately elevated pulmonary artery systolic pressure. The estimated right ventricular systolic pressure is 13.2 mmHg.  3. Right atrial size was moderately dilated.  4. The mitral valve is abnormal. No evidence of mitral valve regurgitation. No evidence of mitral stenosis. The mean mitral valve gradient is 3.0 mmHg with average heart rate of 119 bpm. Mitral valve area by continuity equation 3.3 cm2.  5. Tricuspid valve regurgitation is severe.  6. The aortic valve is calcified. Aortic valve regurgitation is not visualized. Moderate aortic valve stenosis. There is low flow low gradient aortic stenosis, mean gradient 25 mm Hg but in the setting of decreased LV stroke volume. DVI 0.39. Comparison(s): Compared to 2021 study, LVEF and AS are worse; prior study not done in the setting of this tachycardia. FINDINGS  Left Ventricle: Left ventricular ejection fraction, by estimation, is 40 to 45%. The left ventricle has mildly decreased function. The left ventricle demonstrates regional wall motion abnormalities. Definity contrast agent was given IV to delineate the left ventricular endocardial borders. The left ventricular internal cavity size was normal in size. There is mild concentric left ventricular hypertrophy. Abnormal (paradoxical) septal motion consistent with post-operative status and the interventricular  septum is flattened in systole, consistent with right ventricular pressure overload. Left  ventricular diastolic parameters are indeterminate. Right Ventricle: The right ventricular size is mildly  enlarged. No increase in right ventricular wall thickness. Right ventricular systolic function is mildly reduced. There is moderately elevated pulmonary artery systolic pressure. The tricuspid regurgitant velocity is 3.08 m/s, and with an assumed right atrial pressure of 15 mmHg, the estimated right ventricular systolic pressure is 22.0 mmHg. Left Atrium: Left atrial size was normal in size. Right Atrium: Right atrial size was moderately dilated. Pericardium: There is no evidence of pericardial effusion. Mitral Valve: The mitral valve is abnormal. No evidence of mitral valve regurgitation. No evidence of mitral valve stenosis. MV peak gradient, 9.2 mmHg. The mean mitral valve gradient is 3.0 mmHg with average heart rate of 119 bpm. Tricuspid Valve: The tricuspid valve is normal in structure. Tricuspid valve regurgitation is severe. Aortic Valve: The aortic valve is calcified. Aortic valve regurgitation is not visualized. Moderate aortic stenosis is present. Aortic valve mean gradient measures 25.0 mmHg. Aortic valve peak gradient measures 39.1 mmHg. Aortic valve area, by VTI measures 0.94 cm. Pulmonic Valve: The pulmonic valve was not well visualized. Pulmonic valve regurgitation is not visualized. No evidence of pulmonic stenosis. Aorta: The aortic root and ascending aorta are structurally normal, with no evidence of dilitation. IAS/Shunts: No atrial level shunt detected by color flow Doppler.  LEFT VENTRICLE PLAX 2D LVIDd:         4.70 cm LVIDs:         3.80 cm LV PW:         1.30 cm LV IVS:        1.20 cm LVOT diam:     2.00 cm LV SV:         62 LV SV Index:   29 LVOT Area:     3.14 cm  LV Volumes (MOD) LV vol d, MOD A2C: 60.4 ml LV vol d, MOD A4C: 87.5 ml LV vol s, MOD A2C: 36.0 ml LV vol s, MOD A4C: 49.0 ml LV SV MOD A2C:     24.4 ml LV SV MOD A4C:     87.5 ml LV SV MOD BP:      33.2 ml RIGHT VENTRICLE            IVC RV S prime:     6.98 cm/s  IVC diam: 3.50 cm TAPSE (M-mode): 0.8 cm LEFT ATRIUM              Index        RIGHT ATRIUM           Index LA diam:        3.95 cm 1.88 cm/m   RA Area:     29.50 cm LA Vol (A2C):   59.5 ml 28.39 ml/m  RA Volume:   94.40 ml  45.04 ml/m LA Vol (A4C):   55.9 ml 26.67 ml/m LA Biplane Vol: 61.5 ml 29.34 ml/m  AORTIC VALVE AV Area (Vmax):    1.30 cm AV Area (Vmean):   1.16 cm AV Area (VTI):     0.94 cm AV Vmax:           312.60 cm/s AV Vmean:          219.000 cm/s AV VTI:            0.656 m AV Peak Grad:      39.1 mmHg AV Mean Grad:      25.0 mmHg LVOT Vmax:         129.00 cm/s LVOT  Vmean:        80.800 cm/s LVOT VTI:          0.196 m LVOT/AV VTI ratio: 0.30  AORTA Ao Root diam: 3.40 cm Ao Asc diam:  3.20 cm MITRAL VALVE              TRICUSPID VALVE MV Area (PHT): 3.91 cm   TR Peak grad:   37.9 mmHg MV Area VTI:   3.17 cm   TR Vmax:        308.00 cm/s MV Peak grad:  9.2 mmHg MV Mean grad:  3.0 mmHg   SHUNTS MV Vmax:       1.52 m/s   Systemic VTI:  0.20 m MV Vmean:      76.9 cm/s  Systemic Diam: 2.00 cm MV Decel Time: 194 msec Rudean Haskell MD Electronically signed by Rudean Haskell MD Signature Date/Time: 02/06/2021/2:36:02 PM    Final     Orson Eva, DO  [facility]  If 7PM-7AM, please contact night-coverage [email or web address] Password [email or web address] 02/08/2021, 8:45 AM   LOS: 3 days

## 2021-02-09 LAB — COMPREHENSIVE METABOLIC PANEL
ALT: 28 U/L (ref 0–44)
AST: 40 U/L (ref 15–41)
Albumin: 3 g/dL — ABNORMAL LOW (ref 3.5–5.0)
Alkaline Phosphatase: 142 U/L — ABNORMAL HIGH (ref 38–126)
Anion gap: 11 (ref 5–15)
BUN: 18 mg/dL (ref 8–23)
CO2: 23 mmol/L (ref 22–32)
Calcium: 8.3 mg/dL — ABNORMAL LOW (ref 8.9–10.3)
Chloride: 94 mmol/L — ABNORMAL LOW (ref 98–111)
Creatinine, Ser: 0.78 mg/dL (ref 0.61–1.24)
GFR, Estimated: >60 mL/min (ref >60)
Glucose, Bld: 103 mg/dL — ABNORMAL HIGH (ref 70–99)
Potassium: 3.9 mmol/L (ref 3.5–5.1)
Sodium: 128 mmol/L — ABNORMAL LOW (ref 135–145)
Total Bilirubin: 2.1 mg/dL — ABNORMAL HIGH (ref 0.3–1.2)
Total Protein: 5.9 g/dL — ABNORMAL LOW (ref 6.5–8.1)

## 2021-02-09 LAB — CBC
HCT: 37.9 % — ABNORMAL LOW (ref 39.0–52.0)
Hemoglobin: 12.7 g/dL — ABNORMAL LOW (ref 13.0–17.0)
MCH: 30.3 pg (ref 26.0–34.0)
MCHC: 33.5 g/dL (ref 30.0–36.0)
MCV: 90.5 fL (ref 80.0–100.0)
Platelets: 174 10*3/uL (ref 150–400)
RBC: 4.19 MIL/uL — ABNORMAL LOW (ref 4.22–5.81)
RDW: 15.2 % (ref 11.5–15.5)
WBC: 6.6 10*3/uL (ref 4.0–10.5)
nRBC: 0 % (ref 0.0–0.2)

## 2021-02-09 LAB — PROTIME-INR
INR: 1.4 — ABNORMAL HIGH (ref 0.8–1.2)
Prothrombin Time: 17.4 seconds — ABNORMAL HIGH (ref 11.4–15.2)

## 2021-02-09 LAB — MAGNESIUM: Magnesium: 1.9 mg/dL (ref 1.7–2.4)

## 2021-02-09 LAB — BRAIN NATRIURETIC PEPTIDE: B Natriuretic Peptide: 675 pg/mL — ABNORMAL HIGH (ref 0.0–100.0)

## 2021-02-09 MED ORDER — WARFARIN SODIUM 5 MG PO TABS
5.0000 mg | ORAL_TABLET | Freq: Once | ORAL | Status: AC
  Administered 2021-02-09: 5 mg via ORAL
  Filled 2021-02-09: qty 1

## 2021-02-09 MED ORDER — METOPROLOL SUCCINATE ER 50 MG PO TB24
50.0000 mg | ORAL_TABLET | Freq: Two times a day (BID) | ORAL | Status: DC
  Administered 2021-02-09 – 2021-02-10 (×3): 50 mg via ORAL
  Filled 2021-02-09 (×3): qty 1

## 2021-02-09 MED ORDER — SPIRONOLACTONE 25 MG PO TABS
25.0000 mg | ORAL_TABLET | Freq: Every day | ORAL | Status: DC
  Administered 2021-02-09 – 2021-02-10 (×2): 25 mg via ORAL
  Filled 2021-02-09 (×2): qty 1

## 2021-02-09 NOTE — Plan of Care (Signed)

## 2021-02-09 NOTE — Progress Notes (Signed)
Physical Therapy Treatment Patient Details Name: Danny [name]. MRN: [id number] DOB: 1931-11-17 Today's Date: 02/09/2021   History of Present Illness Danny [name]. is a 88 y.o. male sent to the hospital by his PCP due to elevated blood pressures, confusion.  In the office, his blood pressure was 200/130 with a heart rate in the 130s and mildly confused; the patient had an EKG and was found to be in A. fib with RVR. Pt admitted 1/13 with hyponatremia. PMH: peripheral vascular disease, left CEA, coronary disease s/p CABG 2003, HTN, hyperlipidemia, afib    PT Comments    Pt tolerates BLE strengthening exercises, cued for motor control, requires seated rest break between exercises. Pt performs STS reps, slightly unsteady without assist when performing without AD, braces BLE against bed; provided RW with improved steadiness, requiring supv with transfer. Pt ambulates 75 ft x2 reps, seated rest break between. HR up to 115 max noted and back down to 100 at EOS. Cognitively, pt pleasant and follows all commands, tells story of being at PCP office 5x during session and appears to lack insight that he has recently told story. Educated pt on time OOB, ambulating to restroom and in hallway with nursing as able and pt verbalized agreement.   Recommendations for follow up therapy are one component of a multi-disciplinary discharge planning process, led by the attending physician.  Recommendations may be updated based on patient status, additional functional criteria and insurance authorization.  Follow Up Recommendations  Home health PT     Assistance Recommended at Discharge Set up Supervision/Assistance  Patient can return home with the following A little help with walking and/or transfers;A little help with bathing/dressing/bathroom;Help with stairs or ramp for entrance   Equipment Recommendations  None recommended by PT    Recommendations for Other Services       Precautions / Restrictions  Precautions Precautions: Fall Restrictions Weight Bearing Restrictions: No     Mobility  Bed Mobility  General bed mobility comments: in recliner    Transfers Overall transfer level: Needs assistance Equipment used: Rolling walker (2 wheels) Transfers: Sit to/from Stand Sit to Stand: Min guard, Supervision  General transfer comment: min guard to stead without AD, supv with RW, prefers BLE braced against front of chair    Ambulation/Gait Ambulation/Gait assistance: Min guard Gait Distance (Feet): 75 Feet (x2) Assistive device: Rolling walker (2 wheels) Gait Pattern/deviations: Step-through pattern, Decreased stride length Gait velocity: decreased  General Gait Details: slow, step through pattern with maintained bil knee flexion throughout gait cycle, seated rest break between 2 ambulation bouts, denies dizziness, pain and SOB   Stairs    Wheelchair Mobility    Modified Rankin (Stroke Patients Only)       Balance Overall balance assessment: Needs assistance  Standing balance support: During functional activity, No upper extremity supported Standing balance-Leahy Scale: Fair Standing balance comment: fair static, improved with RWor dynamic     Cognition Arousal/Alertness: Awake/alert Behavior During Therapy: WFL for tasks assessed/performed Overall Cognitive Status: Within Functional Limits for tasks assessed  General Comments: pt repetitive, tells story of being at PCP 5x during session; pt pleasant, follows all commands appropriately        Exercises General Exercises - Lower Extremity Long Arc Quad: Seated, AROM, Strengthening, Both, 10 reps Hip Flexion/Marching: Seated, AROM, Strengthening, Both, 20 reps Other Exercises Other Exercises: STS, 5 reps without RW, 5 reps with RW    General Comments        Pertinent Vitals/Pain  Pain Assessment Pain Assessment: No/denies pain    Home Living                          Prior Function             PT Goals (current goals can now be found in the care plan section) Acute Rehab PT Goals Patient Stated Goal: return home with family to assist PT Goal Formulation: With patient Time For Goal Achievement: 02/12/21 Potential to Achieve Goals: Good Progress towards PT goals: Progressing toward goals    Frequency    Min 3X/week      PT Plan Current plan remains appropriate    Co-evaluation              AM-PAC PT "6 Clicks" Mobility   Outcome Measure  Help needed turning from your back to your side while in a flat bed without using bedrails?: None Help needed moving from lying on your back to sitting on the side of a flat bed without using bedrails?: None Help needed moving to and from a bed to a chair (including a wheelchair)?: A Little Help needed standing up from a chair using your arms (e.g., wheelchair or bedside chair)?: A Little Help needed to walk in hospital room?: A Little Help needed climbing 3-5 steps with a railing? : A Little 6 Click Score: 20    End of Session Equipment Utilized During Treatment: Gait belt Activity Tolerance: Patient tolerated treatment well Patient left: in chair;with call bell/phone within reach;with chair alarm set Nurse Communication: Mobility status PT Visit Diagnosis: Unsteadiness on feet (R26.81);Other abnormalities of gait and mobility (R26.89);Muscle weakness (generalized) (M62.81)     Time: 7341-9379 PT Time Calculation (min) (ACUTE ONLY): 28 min  Charges:  $Gait Training: 8-22 mins $Therapeutic Exercise: 8-22 mins                      Tori Archit Leger PT, DPT 02/09/21, 9:44 AM

## 2021-02-09 NOTE — Progress Notes (Signed)
Progress Note  Patient Name: Danny [name]. Date of Encounter: 02/09/2021  [facility] Cardiologist: [name], MD   Subjective   No complaints  Inpatient Medications    Scheduled Meds:  amiodarone  100 mg Oral Daily   Chlorhexidine Gluconate Cloth  6 each Topical Daily   furosemide  40 mg Intravenous Daily   melatonin  3 mg Oral QHS   metoprolol tartrate  50 mg Oral BID   pantoprazole  40 mg Oral Daily   pravastatin  40 mg Oral q1800   Warfarin - Pharmacist Dosing Inpatient   Does not apply q1600   Continuous Infusions:  PRN Meds: acetaminophen, docusate sodium, ondansetron (ZOFRAN) IV, polyethylene glycol   Vital Signs    Vitals:   02/09/21 0500 02/09/21 0516 02/09/21 0700 02/09/21 0754  BP: (!) 152/83  (!) 152/99   Pulse: 89  93   Resp: 19  15   Temp:  (!) 97.4 F (36.3 C)  (!) 97.3 F (36.3 C)  TempSrc:  Axillary  Oral  SpO2: 95%  96%   Weight: 84.7 kg 82.8 kg    Height:        Intake/Output Summary (Last 24 hours) at 02/09/2021 0826 Last data filed at 02/09/2021 0329 Gross per 24 hour  Intake 440 ml  Output 2325 ml  Net -1885 ml   Last 3 Weights 02/09/2021 02/09/2021 02/08/2021  Weight (lbs) 182 lb 8.7 oz 186 lb 11.7 oz 192 lb 7.4 oz  Weight (kg) 82.8 kg 84.7 kg 87.3 kg      Telemetry    Afib 80s to low 100s - Personally Reviewed  ECG    N/a - Personally Reviewed  Physical Exam   GEN: No acute distress.   Neck: elevated JVD Cardiac: irreg, 3/6 systolic murmur rusb Respiratory: Clear to auscultation bilaterally. GI: Soft, nontender, non-distended  MS: 1+ bilateral LE edema Neuro:  Nonfocal  Psych: Normal affect   Labs    High Sensitivity Troponin:   Recent Labs  Lab 02/05/21 1450 02/05/21 1609  TROPONINIHS 22* 22*     Chemistry Recent Labs  Lab 02/07/21 0430 02/08/21 0422 02/09/21 0406  NA 124* 125* 128*  K 3.8 3.0* 3.9  CL 92* 92* 94*  CO2 22 24 23   GLUCOSE 109* 104* 103*  BUN 17 21 18   CREATININE 0.80 0.88  0.78  CALCIUM 8.7* 8.1* 8.3*  MG 1.7 1.6* 1.9  PROT 6.1* 5.7* 5.9*  ALBUMIN 3.3* 3.0* 3.0*  AST 43* 36 40  ALT 27 26 28   ALKPHOS 140* 133* 142*  BILITOT 1.7* 1.8* 2.1*  GFRNONAA >60 >60 >60  ANIONGAP 10 9 11     Lipids No results for input(s): CHOL, TRIG, HDL, LABVLDL, LDLCALC, CHOLHDL in the last 168 hours.  Hematology Recent Labs  Lab 02/07/21 0430 02/08/21 0422 02/09/21 0406  WBC 8.8 7.7 6.6  RBC 4.09* 3.99* 4.19*  HGB 12.5* 12.3* 12.7*  HCT 37.0* 35.6* 37.9*  MCV 90.5 89.2 90.5  MCH 30.6 30.8 30.3  MCHC 33.8 34.6 33.5  RDW 15.0 14.9 15.2  PLT 168 171 174   Thyroid  Recent Labs  Lab 02/05/21 1353  TSH 0.838    BNP Recent Labs  Lab 02/06/21 0903 02/09/21 0406  BNP 491.0* 675.0*    DDimer No results for input(s): DDIMER in the last 168 hours.   Radiology    DG Chest Port 1 View  Result Date: 02/08/2021 CLINICAL DATA:  Intermittent shortness of breath, history of  AFib, CABG EXAM: PORTABLE CHEST 1 VIEW COMPARISON:  Chest radiograph 02/05/2021 FINDINGS: Median sternotomy wires and mediastinal surgical clips are again noted. The cardiomediastinal silhouette is stable. A small right pleural effusion is again seen with patchy airspace opacity in the right lower lobe, overall slightly improved since 02/05/2021. A trace left pleural effusion is likely not significantly changed. Otherwise, there is no new or worsening focal airspace disease. There is vascular congestion without definite overt pulmonary edema. There is no pneumothorax. The bones are stable. IMPRESSION: 1. Small right pleural effusion with patchy opacities in the right lower lobe, slightly improved since 02/05/2021. Continued radiographic follow up to resolution is recommended. 2. Trace left pleural effusion is likely not significantly changed. 3. No new or worsening focal airspace disease. Electronically Signed   By: Valetta Mole M.D.   On: 02/08/2021 12:12    Cardiac Studies    Patient Profile     Danny Scallon. is a 88 y.o. male with a hx of CAD, PAF on amio who is being seen 02/08/2021 for the evaluation of Afib with RVR at the request of Dr. Carles Collet.  Assessment & Plan    1.Afib, longstanding persistent --has been on amiodarone as outpatient - from Dr Kyla Balzarine clinic note patient turned down DCCV on amio, has stayed on amio for rate control   -supratherapeutic INR on admission, now subtherapeutic - he reports would consider DCCV if felt would be of benefit now. With subtherepeutic INR and controled rates no indication for inpatient DCCV, defer ongoing discussions with his primary cardiologist. With drop in LVEF may be worth attempt.    - he is on amio 100mg  daily, lopressor 50mg  bid. With mild LV dysfunction change to toprol 50mg  bid.     2.Aortic stenosis - Jan 2023 echo: mean grad 25, AVA VTI 0.94 DI 0.30, SVI 29 - majority of criteria support moderate AS, continue to monitor at this time.    3. Acute systolic HF - Jan 6962 echo: LVE 40-45%, mild RV dysfunction, enlarged RV, PASP 53. AVA - cath just last year with patent grafts - perhaps tachy mediated - initially received IVFs due to hyponatremia.    - change lopressor to toprol 50mg  bid - losartan held thought could be contrbuting to hyponatremia, sodiums are improving. Remain off ACE/ARB/ARN at this time. Start aldactone 25mg  daily - he is on IV lasix 40mg  daily, neg 1.9 L yesterday. Renal function is stable, despite diuretic fortunately Na continues to improve. BNP 491-->675. CXR yesterday small right pleural eeffusion trace left effusion. REmains fluid overloaded, continue IV diuretic today.      4.Hyponatremia - Na 120 on admission, up to 128 toady   5. Hypokalemia - received oral replacement.   For questions or updates, please contact [place] Please consult [email or web address] for contact info under        Signed, Carlyle Dolly, MD  02/09/2021, 8:26 AM

## 2021-02-09 NOTE — Progress Notes (Signed)
ANTICOAGULATION CONSULT NOTE - Initial Consult  Pharmacy Consult for warfarin Indication: atrial fibrillation  Allergies  Allergen Reactions   Doxycycline Diarrhea   Contrast Media [Iodinated Contrast Media]     Syncope   Lunesta [Eszopiclone]     "felt WILD"   Morphine And Related Nausea And Vomiting   Trazodone And Nefazodone Other (See Comments)    Extremely drowsy the next day after taking.    Patient Measurements: Height: 6\' 2"  (188 cm) Weight: 82.8 kg (182 lb 8.7 oz) IBW/kg (Calculated) : 82.2 Heparin Dosing Weight:   Vital Signs: Temp: 97.3 F (36.3 C) (01/17 0754) Temp Source: Oral (01/17 0754) BP: 152/99 (01/17 0700) Pulse Rate: 93 (01/17 0700)  Labs: Recent Labs    02/07/21 0430 02/08/21 0422 02/09/21 0406 02/09/21 0557  HGB 12.5* 12.3* 12.7*  --   HCT 37.0* 35.6* 37.9*  --   PLT 168 171 174  --   LABPROT 25.9* 19.5*  --  17.4*  INR 2.4* 1.7*  --  1.4*  CREATININE 0.80 0.88 0.78  --      Estimated Creatinine Clearance: 75.6 mL/min (by C-G formula based on SCr of 0.78 mg/dL).   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   02/05/2021   furosemide (LASIX) 20 MG tablet  TAKE 1 TABLET EVERY DAY (Patient taking differently: Take 20 mg by mouth daily.) 90 tablet 1 02/04/2021   levocetirizine (XYZAL) 5 MG tablet Take 5 mg by mouth daily as needed.   unk   losartan (COZAAR) 50 MG tablet Take 1 tablet (50 mg total) by mouth every morning. 90 tablet 1 02/04/2021   lovastatin (MEVACOR) 40 MG tablet Take 1 tablet (40 mg total) by mouth at bedtime. 90 tablet 3 02/04/2021   metoprolol tartrate (LOPRESSOR) 50 MG tablet Take 1 tablet (50 mg total) by mouth 2 (two) times daily. 180 tablet 0 02/04/2021 at 1600   Multiple Vitamin (MULTIVITAMIN WITH MINERALS) TABS tablet Take 1 tablet by mouth daily. One-A-Day for Men 50+   02/04/2021   Multiple Vitamins-Minerals (PRESERVISION AREDS 2 PO) Take 1 tablet by mouth in the morning and at bedtime.   02/04/2021   nitroGLYCERIN (NITROSTAT) 0.4 MG SL tablet PLACE 1 TABLET (0.4 MG TOTAL) UNDER THE TONGUE EVERY 5 (FIVE) MINUTES AS NEEDED FOR CHEST PAIN. 100 tablet 3 unk   Omega-3 Fatty Acids (FISH OIL) 1200 MG CAPS Take 2,400 mg by mouth daily.   02/04/2021   omeprazole (PRILOSEC) 20 MG capsule Take 1 capsule (20 mg total) by mouth 2 (two) times daily before a meal. 180 capsule 1 02/04/2021   Probiotic Product (PHILLIPS COLON HEALTH PO) Take 1  capsule by mouth daily as needed.   unk   warfarin (COUMADIN) 5 MG tablet TAKE 1 TABLET EVERY DAY 90 tablet 0 02/04/2021 at 1700   traMADol (ULTRAM) 50 MG tablet Take 0.5-1 tablets (25-50 mg total) by mouth 2 (two) times daily as needed. (Patient not taking: Reported on 02/05/2021) 10 tablet 0 Not Taking    Assessment: Pharmacy consulted to dose warfarin in patient with atrial fibrillation.  Patient admitted with supratherapeutic INR of of 6.5. INR today 2.4.  Home dose listed as 5 mg daily.  Patient is also on amiodarone prior to admission.  Patient may need to be on reduced dose based on admission INR.  CBC WNL IN 1.4  Goal of Therapy:  INR 2-3 Monitor platelets by anticoagulation protocol: Yes   Plan:   Warfarin 5 mg x 1 dose. Monitor daily INR and s/s of bleeding  Thomasenia Sales, PharmD, [facility] Clinical Pharmacist  02/09/2021 8:43 AM

## 2021-02-10 ENCOUNTER — Other Ambulatory Visit: Payer: Self-pay | Admitting: Student

## 2021-02-10 DIAGNOSIS — I6523 Occlusion and stenosis of bilateral carotid arteries: Secondary | ICD-10-CM

## 2021-02-10 DIAGNOSIS — I509 Heart failure, unspecified: Secondary | ICD-10-CM

## 2021-02-10 DIAGNOSIS — Z79899 Other long term (current) drug therapy: Secondary | ICD-10-CM

## 2021-02-10 DIAGNOSIS — Z7901 Long term (current) use of anticoagulants: Secondary | ICD-10-CM

## 2021-02-10 DIAGNOSIS — K219 Gastro-esophageal reflux disease without esophagitis: Secondary | ICD-10-CM

## 2021-02-10 LAB — COMPREHENSIVE METABOLIC PANEL
ALT: 35 U/L (ref 0–44)
AST: 43 U/L — ABNORMAL HIGH (ref 15–41)
Albumin: 3.1 g/dL — ABNORMAL LOW (ref 3.5–5.0)
Alkaline Phosphatase: 151 U/L — ABNORMAL HIGH (ref 38–126)
Anion gap: 12 (ref 5–15)
BUN: 17 mg/dL (ref 8–23)
CO2: 28 mmol/L (ref 22–32)
Calcium: 8.5 mg/dL — ABNORMAL LOW (ref 8.9–10.3)
Chloride: 91 mmol/L — ABNORMAL LOW (ref 98–111)
Creatinine, Ser: 0.9 mg/dL (ref 0.61–1.24)
GFR, Estimated: >60 mL/min (ref >60)
Glucose, Bld: 99 mg/dL (ref 70–99)
Potassium: 3.1 mmol/L — ABNORMAL LOW (ref 3.5–5.1)
Sodium: 131 mmol/L — ABNORMAL LOW (ref 135–145)
Total Bilirubin: 1.7 mg/dL — ABNORMAL HIGH (ref 0.3–1.2)
Total Protein: 5.9 g/dL — ABNORMAL LOW (ref 6.5–8.1)

## 2021-02-10 LAB — CBC
HCT: 36.4 % — ABNORMAL LOW (ref 39.0–52.0)
Hemoglobin: 12.6 g/dL — ABNORMAL LOW (ref 13.0–17.0)
MCH: 32.1 pg (ref 26.0–34.0)
MCHC: 34.6 g/dL (ref 30.0–36.0)
MCV: 92.9 fL (ref 80.0–100.0)
Platelets: 200 10*3/uL (ref 150–400)
RBC: 3.92 MIL/uL — ABNORMAL LOW (ref 4.22–5.81)
RDW: 15.6 % — ABNORMAL HIGH (ref 11.5–15.5)
WBC: 5.5 10*3/uL (ref 4.0–10.5)
nRBC: 0 % (ref 0.0–0.2)

## 2021-02-10 LAB — CULTURE, BLOOD (ROUTINE X 2)
Culture: NO GROWTH
Culture: NO GROWTH
Special Requests: ADEQUATE
Special Requests: ADEQUATE

## 2021-02-10 LAB — PROTIME-INR
INR: 1.4 — ABNORMAL HIGH (ref 0.8–1.2)
Prothrombin Time: 17.2 seconds — ABNORMAL HIGH (ref 11.4–15.2)

## 2021-02-10 LAB — MAGNESIUM: Magnesium: 1.9 mg/dL (ref 1.7–2.4)

## 2021-02-10 MED ORDER — POTASSIUM CHLORIDE CRYS ER 20 MEQ PO TBCR: 40.0000 meq | EXTENDED_RELEASE_TABLET | Freq: Every day | ORAL | 1 refills | Status: DC

## 2021-02-10 MED ORDER — FUROSEMIDE 40 MG PO TABS: 40.0000 mg | ORAL_TABLET | Freq: Every day | ORAL | 1 refills | Status: DC

## 2021-02-10 MED ORDER — METOPROLOL SUCCINATE ER 50 MG PO TB24: 50.0000 mg | ORAL_TABLET | Freq: Two times a day (BID) | ORAL | 1 refills | Status: DC

## 2021-02-10 MED ORDER — AMIODARONE HCL 100 MG PO TABS: 100.0000 mg | ORAL_TABLET | Freq: Every day | ORAL | 1 refills | Status: DC

## 2021-02-10 MED ORDER — POTASSIUM CHLORIDE CRYS ER 20 MEQ PO TBCR
60.0000 meq | EXTENDED_RELEASE_TABLET | Freq: Once | ORAL | Status: AC
  Administered 2021-02-10: 60 meq via ORAL
  Filled 2021-02-10: qty 3

## 2021-02-10 MED ORDER — POTASSIUM CHLORIDE CRYS ER 20 MEQ PO TBCR: 40.0000 meq | EXTENDED_RELEASE_TABLET | Freq: Every day | ORAL | Status: DC

## 2021-02-10 MED ORDER — SPIRONOLACTONE 25 MG PO TABS: 25.0000 mg | ORAL_TABLET | Freq: Every day | ORAL | 1 refills | Status: DC

## 2021-02-10 MED ORDER — FUROSEMIDE 40 MG PO TABS: 40.0000 mg | ORAL_TABLET | Freq: Every day | ORAL | Status: DC

## 2021-02-10 MED ORDER — WARFARIN SODIUM 5 MG PO TABS: 5.0000 mg | ORAL_TABLET | Freq: Once | ORAL | Status: DC

## 2021-02-10 NOTE — Discharge Instructions (Addendum)
Labs in 1 week (can be obtained by PCP or at the lab at [facility] which is open from 8:30 - 3:30)

## 2021-02-10 NOTE — Progress Notes (Signed)
Progress Note  Patient Name: Danny [name]. Date of Encounter: 02/10/2021  [facility] Cardiologist: [name], MD   Subjective   No complaints  Inpatient Medications    Scheduled Meds:  amiodarone  100 mg Oral Daily   Chlorhexidine Gluconate Cloth  6 each Topical Daily   furosemide  40 mg Intravenous Daily   melatonin  3 mg Oral QHS   metoprolol succinate  50 mg Oral BID   pantoprazole  40 mg Oral Daily   potassium chloride  60 mEq Oral Once   pravastatin  40 mg Oral q1800   spironolactone  25 mg Oral Daily   Warfarin - Pharmacist Dosing Inpatient   Does not apply q1600   Continuous Infusions:  PRN Meds: acetaminophen, docusate sodium, ondansetron (ZOFRAN) IV, polyethylene glycol   Vital Signs    Vitals:   02/10/21 0000 02/10/21 0007 02/10/21 0105 02/10/21 0543  BP: 118/73  (!) 144/107 124/78  Pulse: 100  78 82  Resp: 14  18 19   Temp:  97.7 F (36.5 C) 97.6 F (36.4 C) 97.7 F (36.5 C)  TempSrc:  Oral Oral Oral  SpO2: 94%  96% 99%  Weight:   80.3 kg 80.3 kg  Height:        Intake/Output Summary (Last 24 hours) at 02/10/2021 0917 Last data filed at 02/10/2021 0530 Gross per 24 hour  Intake 720 ml  Output 1075 ml  Net -355 ml   Last 3 Weights 02/10/2021 02/10/2021 02/09/2021  Weight (lbs) 177 lb 0.5 oz 177 lb 0.5 oz 182 lb 8.7 oz  Weight (kg) 80.3 kg 80.3 kg 82.8 kg      Telemetry    Rate controlled afib - Personally Reviewed  ECG    N/a - Personally Reviewed  Physical Exam   GEN: No acute distress.   Neck: elevated JVD Cardiac: irreg, 3/6 systolic murmur rusb Respiratory: Clear to auscultation bilaterally. GI: Soft, nontender, non-distended  MS: 1+ bilateral LE edema; No deformity. Neuro:  Nonfocal  Psych: Normal affect   Labs    High Sensitivity Troponin:   Recent Labs  Lab 02/05/21 1450 02/05/21 1609  TROPONINIHS 22* 22*     Chemistry Recent Labs  Lab 02/08/21 0422 02/09/21 0406 02/10/21 0604  NA 125* 128* 131*  K  3.0* 3.9 3.1*  CL 92* 94* 91*  CO2 24 23 28   GLUCOSE 104* 103* 99  BUN 21 18 17   CREATININE 0.88 0.78 0.90  CALCIUM 8.1* 8.3* 8.5*  MG 1.6* 1.9 1.9  PROT 5.7* 5.9* 5.9*  ALBUMIN 3.0* 3.0* 3.1*  AST 36 40 43*  ALT 26 28 35  ALKPHOS 133* 142* 151*  BILITOT 1.8* 2.1* 1.7*  GFRNONAA >60 >60 >60  ANIONGAP 9 11 12     Lipids No results for input(s): CHOL, TRIG, HDL, LABVLDL, LDLCALC, CHOLHDL in the last 168 hours.  Hematology Recent Labs  Lab 02/08/21 0422 02/09/21 0406 02/10/21 0604  WBC 7.7 6.6 5.5  RBC 3.99* 4.19* 3.92*  HGB 12.3* 12.7* 12.6*  HCT 35.6* 37.9* 36.4*  MCV 89.2 90.5 92.9  MCH 30.8 30.3 32.1  MCHC 34.6 33.5 34.6  RDW 14.9 15.2 15.6*  PLT 171 174 200   Thyroid  Recent Labs  Lab 02/05/21 1353  TSH 0.838    BNP Recent Labs  Lab 02/06/21 0903 02/09/21 0406  BNP 491.0* 675.0*    DDimer No results for input(s): DDIMER in the last 168 hours.   Radiology    DG  Chest Port 1 View  Result Date: 02/08/2021 CLINICAL DATA:  Intermittent shortness of breath, history of AFib, CABG EXAM: PORTABLE CHEST 1 VIEW COMPARISON:  Chest radiograph 02/05/2021 FINDINGS: Median sternotomy wires and mediastinal surgical clips are again noted. The cardiomediastinal silhouette is stable. A small right pleural effusion is again seen with patchy airspace opacity in the right lower lobe, overall slightly improved since 02/05/2021. A trace left pleural effusion is likely not significantly changed. Otherwise, there is no new or worsening focal airspace disease. There is vascular congestion without definite overt pulmonary edema. There is no pneumothorax. The bones are stable. IMPRESSION: 1. Small right pleural effusion with patchy opacities in the right lower lobe, slightly improved since 02/05/2021. Continued radiographic follow up to resolution is recommended. 2. Trace left pleural effusion is likely not significantly changed. 3. No new or worsening focal airspace disease. Electronically  Signed   By: Valetta Mole M.D.   On: 02/08/2021 12:12    Cardiac Studies    Patient Profile     Danny [name]. is a 88 y.o. male with a hx of CAD, PAF on amio who is being seen 02/08/2021 for the evaluation of Afib with RVR at the request of Dr. Carles Collet.  Assessment & Plan    1.Afib, longstanding persistent --has been on amiodarone as outpatient - from Dr Kyla Balzarine clinic note patient turned down DCCV on amio, has stayed on amio for rate control   -supratherapeutic INR on admission, now subtherapeutic - he reports would consider DCCV if felt would be of benefit now. With subtherepeutic INR and controled rates no indication for inpatient DCCV, defer ongoing discussions with his primary cardiologist. With drop in LVEF may be worth attempt.    - he is on amio 100mg  daily,lopressor changed to toprol 50mg  bid in setting of LV dysfunction. -tele shows rates controlled 70s to 90s   2.Aortic stenosis - Jan 2023 echo: mean grad 25, AVA VTI 0.94 DI 0.30, SVI 29 - majority of criteria support moderate AS, continue to monitor at this time.    3. Acute systolic HF - Jan 2836 echo: LVE 40-45%, mild RV dysfunction, enlarged RV, PASP 53. AVA - cath just last year with patent grafts - perhaps tachy mediated - initially received IVFs due to hyponatremia.     - changed lopressor to toprol 50mg  bid this admit - losartan held thought could be contrbuting to hyponatremia, sodiums are improving. Remain off ACE/ARB/ARN at this time. Started aldactone 25mg  daily - he is on IV lasix 40mg  daily, I/Os incomplete yesterday.  Slight uptrend in Cr, despite diuretic fortunately Na continues to improve. BNP 491-->675 this admit  - still some ongoing fluid overload, symptoms wise he feels much improved and would be reasonable to discharge today with further oral diuresis at home - start oral lasix 40mg  daily. Would also start daily KCl replacement 19mEq daily,check bmet/mg/bnp in 1 week.      4.Symptomatic  Hyponatremia - Na 120 on admission, up to 128 toady - Na up to 131 today    5. Hypokalemia - received 57mEq oral this AM  Ok for discharge today. We will arrange PA f/u 2-3 weeks and bmet/mg/bnp in 1 week.   For questions or updates, please contact [place] Please consult [email or web address] for contact info under        Signed, Carlyle Dolly, MD  02/10/2021, 9:17 AM

## 2021-02-10 NOTE — Progress Notes (Addendum)
ANTICOAGULATION CONSULT NOTE -   Pharmacy Consult for warfarin Indication: atrial fibrillation  Allergies  Allergen Reactions   Doxycycline Diarrhea   Contrast Media [Iodinated Contrast Media]     Syncope   Lunesta [Eszopiclone]     "felt WILD"   Morphine And Related Nausea And Vomiting   Trazodone And Nefazodone Other (See Comments)    Extremely drowsy the next day after taking.    Patient Measurements: Height: 6\' 2"  (188 cm) Weight: 80.3 kg (177 lb 0.5 oz) IBW/kg (Calculated) : 82.2  Vital Signs: Temp: 97.7 F (36.5 C) (01/18 0543) Temp Source: Oral (01/18 0543) BP: 124/78 (01/18 0543) Pulse Rate: 82 (01/18 0543)  Labs: Recent Labs    02/08/21 0422 02/09/21 0406 02/09/21 0557 02/10/21 0604  HGB 12.3* 12.7*  --  12.6*  HCT 35.6* 37.9*  --  36.4*  PLT 171 174  --  200  LABPROT 19.5*  --  17.4* 17.2*  INR 1.7*  --  1.4* 1.4*  CREATININE 0.88 0.78  --  0.90     Estimated Creatinine Clearance: 65.7 mL/min (by C-G formula based on SCr of 0.9 mg/dL).   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   02/05/2021   furosemide (LASIX) 20 MG tablet TAKE 1 TABLET EVERY DAY (Patient taking  differently: Take 20 mg by mouth daily.) 90 tablet 1 02/04/2021   levocetirizine (XYZAL) 5 MG tablet Take 5 mg by mouth daily as needed.   unk   losartan (COZAAR) 50 MG tablet Take 1 tablet (50 mg total) by mouth every morning. 90 tablet 1 02/04/2021   lovastatin (MEVACOR) 40 MG tablet Take 1 tablet (40 mg total) by mouth at bedtime. 90 tablet 3 02/04/2021   metoprolol tartrate (LOPRESSOR) 50 MG tablet Take 1 tablet (50 mg total) by mouth 2 (two) times daily. 180 tablet 0 02/04/2021 at 1600   Multiple Vitamin (MULTIVITAMIN WITH MINERALS) TABS tablet Take 1 tablet by mouth daily. One-A-Day for Men 50+   02/04/2021   Multiple Vitamins-Minerals (PRESERVISION AREDS 2 PO) Take 1 tablet by mouth in the morning and at bedtime.   02/04/2021   nitroGLYCERIN (NITROSTAT) 0.4 MG SL tablet PLACE 1 TABLET (0.4 MG TOTAL) UNDER THE TONGUE EVERY 5 (FIVE) MINUTES AS NEEDED FOR CHEST PAIN. 100 tablet 3 unk   Omega-3 Fatty Acids (FISH OIL) 1200 MG CAPS Take 2,400 mg by mouth daily.   02/04/2021   omeprazole (PRILOSEC) 20 MG capsule Take 1 capsule (20 mg total) by mouth 2 (two) times daily before a meal. 180 capsule 1 02/04/2021   Probiotic Product (PHILLIPS COLON HEALTH PO) Take 1 capsule by mouth daily as  needed.   unk   warfarin (COUMADIN) 5 MG tablet TAKE 1 TABLET EVERY DAY 90 tablet 0 02/04/2021 at 1700   traMADol (ULTRAM) 50 MG tablet Take 0.5-1 tablets (25-50 mg total) by mouth 2 (two) times daily as needed. (Patient not taking: Reported on 02/05/2021) 10 tablet 0 Not Taking    Assessment: Pharmacy consulted to dose warfarin in patient with atrial fibrillation.  Patient admitted with supratherapeutic INR of of 6.5. On 1/13  Vit K 2.5mg  given Home dose listed as 5 mg daily.  Patient is also on amiodarone prior to admission.  Patient may need to be on reduced dose based on admission INR.  CBC WNL IN 1.4 , subtherapeutic. Will go slow with coumadin d/t initial elevations in INR.   Goal of Therapy:  INR 2-3 Monitor  platelets by anticoagulation protocol: Yes   Plan:  Warfarin 5 mg x 1 dose. Monitor daily INR and s/s of bleeding  Isac Sarna, BS [place], [place] Clinical Pharmacist Pager [phone] 02/10/2021 9:51 AM

## 2021-02-10 NOTE — Care Management Important Message (Signed)
Important Message  Patient Details  Name: Danny [name]. MRN: [id number] Date of Birth: 14-Jan-1932   Medicare Important Message Given:  Yes     Tommy Medal 02/10/2021, 11:40 AM

## 2021-02-10 NOTE — Progress Notes (Signed)
Received patient from ICU. Patient awake, alert and pleasant. Patient forgetful. Patient settled into room, placed on telemetry, assessment done. Patient without complaints or distress. Will continue to monitor as per orders.

## 2021-02-10 NOTE — Progress Notes (Signed)
PT is being discharged home and niece will be the one taking care of him. She said she will be able to come get him after lunch. Discharge paper work will be gone over with patient and family member.

## 2021-02-10 NOTE — Progress Notes (Signed)
Pt discharged via WC to POV by B. Tessa Lerner, [place].

## 2021-02-10 NOTE — Discharge Summary (Signed)
Physician Discharge Summary  Danny [name]. RXV:[id number] DOB: [birth year] DOA: 02/05/2021  PCP: Loman Brooklyn, FNP  Admit date: 02/05/2021 Discharge date: 02/10/2021  Admitted From:  HOME  Disposition:  HOME with  [place]   Recommendations for Outpatient Follow-up:  Follow up with PCP in 1 weeks Follow up with cardiology team on 03/04/21 as scheduled Repeat BMP in 1 week per cardiology recommendation and orders Please continue to monitor PT/INR and adjust warfarin dose as needed  Home Health:  PT   Discharge Condition: STABLE   CODE STATUS: FULL  DIET: heart healthy recommended    Brief Hospitalization Summary: Please see all hospital notes, images, labs for full details of the hospitalization. ADMISSION HPI: 88 y.o. male with a history of paroxysmal atrial fibrillation on Coumadin, coronary artery disease, hypertension, hyperlipidemia.  Patient was sent to the hospital by his PCP due to elevated blood pressures, confusion.  In the office, his blood pressure was 200/130 with a heart rate in the 130s and mildly confused.  The patient had an EKG and was found to be in A. fib with RVR.  EMS was called and the patient was transported to the hospital.  Initially, the patient states that he did not feel well.  In my interview, the patient states that he is feeling fine, but does not recall much of her earlier today.  He is accompanied by his niece, who provides history.  She states that he has been fairly confused, worse than normal.  No palliating or provoking factors.   Emergency Department Course: Blood work shows supratherapeutic INR 6.5.  Vitamin K given.  Sodium level 120.  IV fluids started.  HOSPITAL COURSE BY PROBLEM LIST   Hyponatremia - TREATED AND IMPROVED NOW UP TO 131  -Likely multifactorial but primarily driven by poor solute intake and volume depletion>>now fluid overloaed -initially fluid resuscitated -1/15--appears hypervolemic>>lasix IV x 1 -1/16--repeat  lasix -There is a component of chronicity with sodium ranging 125-130 -FeNa 0.93% -Urine osmolarity--468 -Serum osmolarity--264 -SODIUM HAS  IMPROVED TO 131 ON DAY OF DISCHARGE    Persistent atrial fibrillation with RVR -Changed IV Lopressor 5 mg IV every 6 -Continue amiodarone>>start amio drip if HR remains poorly controlled -The patient has declined ablation in the past -Presented with supratherapeutic INR 6.5>>2.5 after vit K -Echo EF 40-45%, +moderate AS, RVSP 52.9 - NOW ON METOPROLOL SUCCINATE 50 MG BID    Acute systolic CHF -he was treated with IV lasix -02/06/21 echo--EF 40-45%, mod AS, RVSP 52.9, mod decrease RV -severe TR -normal R-hear pressures on 02/10/21 cath -consult cardiology while inpatient and plans for outpatient follow up on 03/04/21   Supratherapeutic INR -No signs of bleeding -Given vitamin K 02/05/2021 -Repeat INR 2.4 -restarted warfarin 5 mg daily  -outpatient follow up of PT/INR    Acute metabolic encephalopathy -Obtain UA--no pyuria -[id number] -Folic TIWP--80.9 -TSH 9.833 -CT brain negative for acute findings -1/16--back to baseline   Elevated lactate -Blood cultures x2 sets--neg to date -Check PCT <0.10 -UA --no pyuria   Lower extremity pain and edema -Venous duplex--neg   Coronary artery disease -No chest pain presently -1/18.23 cath---Left main ostial occlusion; Nonobstructive disease in the RCA; patent grafts -Troponin 22>> 22 -Personally reviewed EKG--atrial fibrillation, nonspecific ST-T wave change   Abdominal pain -CT abd/pelv--no acute findings; bilateral pleural effusions  R>L -improved -tolerating diet   Hypokalemia/Hypomagnesemia -replete   Goals of Care -Advance care planning, including the explanation and discussion of advance directives was carried out with  the patient and family.  Code status including explanations of "Full Code" and "DNR" and alternatives were discussed in detail.  Discussion of end-of-life issues  including but not limited palliative care, hospice care and the concept of hospice, other end-of-life care options, power of attorney for health care decisions, living wills, and physician orders for life-sustaining treatment were also discussed with the patient and family.  Total face to face time 16 minutes. -confirmed DNR with spouse and niece   Discharge Diagnoses:  Principal Problem:   Acute hyponatremia Active Problems:   Essential hypertension   Carotid stenosis   Long term (current) use of anticoagulants [Z79.01]   Persistent atrial fibrillation (HCC)   Secondary hypercoagulable state ([place])   Gastroesophageal reflux disease   Supratherapeutic INR   Acute metabolic encephalopathy   Atrial fibrillation with RVR [facility])  Discharge Instructions:  Allergies as of 02/10/2021       Reactions   Doxycycline Diarrhea   Contrast Media [iodinated Contrast Media]    Syncope   Lunesta [eszopiclone]    "felt WILD"   Morphine And Related Nausea And Vomiting   Trazodone And Nefazodone Other (See Comments)   Extremely drowsy the next day after taking.        Medication List     STOP taking these medications    losartan 50 MG tablet Commonly known as: COZAAR   metoprolol tartrate 50 MG tablet Commonly known as: LOPRESSOR   traMADol 50 MG tablet Commonly known as: ULTRAM       TAKE these medications    amiodarone 100 MG tablet Commonly known as: PACERONE Take 1 tablet (100 mg total) by mouth daily. What changed:  medication strength See the new instructions.   cholecalciferol 25 MCG (1000 UNIT) tablet Commonly known as: VITAMIN D Take 2,000 Units by mouth daily with breakfast.   Fish Oil 1200 MG Caps Take 2,400 mg by mouth daily.   furosemide 40 MG tablet Commonly known as: LASIX Take 1 tablet (40 mg total) by mouth daily. What changed:  medication strength how much to take   levocetirizine 5 MG tablet Commonly known as: XYZAL Take 5 mg by mouth daily as  needed.   lovastatin 40 MG tablet Commonly known as: MEVACOR Take 1 tablet (40 mg total) by mouth at bedtime.   metoprolol succinate 50 MG 24 hr tablet Commonly known as: TOPROL-XL Take 1 tablet (50 mg total) by mouth 2 (two) times daily. Take with or immediately following a meal.   multivitamin with minerals Tabs tablet Take 1 tablet by mouth daily. One-A-Day for Men 50+   nitroGLYCERIN 0.4 MG SL tablet Commonly known as: Nitrostat PLACE 1 TABLET (0.4 MG TOTAL) UNDER THE TONGUE EVERY 5 (FIVE) MINUTES AS NEEDED FOR CHEST PAIN.   omeprazole 20 MG capsule Commonly known as: PRILOSEC Take 1 capsule (20 mg total) by mouth 2 (two) times daily before a meal.   PHILLIPS COLON HEALTH PO Take 1 capsule by mouth daily as needed.   potassium chloride SA 20 MEQ tablet Commonly known as: KLOR-CON M Take 2 tablets (40 mEq total) by mouth daily. Start taking on: February 11, 2021   PRESERVISION AREDS 2 PO Take 1 tablet by mouth in the morning and at bedtime.   spironolactone 25 MG tablet Commonly known as: ALDACTONE Take 1 tablet (25 mg total) by mouth daily. Start taking on: February 11, 2021   warfarin 5 MG tablet Commonly known as: COUMADIN Take as directed. If you are unsure how  to take this medication, talk to your nurse or doctor. Original instructions: TAKE 1 TABLET EVERY DAY        Follow-up Information     [facility] Follow up on 03/04/2021.   Specialty: Cardiology Why: Labs in 1 week (can be obtained by PCP or at the lab at [facility] which is open from 8:30 - 3:30). Cardiology Hospital Follow-up on 03/04/2021 at 2:00 PM with Dr. Nolen Mu information: [address] [place], [place], [place]. Schedule an appointment as soon as possible for a visit in 1 week(s).   Specialty: Family Medicine Why: Hospital Follow Up Contact information: [place] [address] [phone]                 Allergies  Allergen Reactions   Doxycycline Diarrhea   Contrast Media [Iodinated Contrast Media]     Syncope   Lunesta [Eszopiclone]     "felt WILD"   Morphine And Related Nausea And Vomiting   Trazodone And Nefazodone Other (See Comments)    Extremely drowsy the next day after taking.   Allergies as of 02/10/2021       Reactions   Doxycycline Diarrhea   Contrast Media [iodinated Contrast Media]    Syncope   Lunesta [eszopiclone]    "felt WILD"   Morphine And Related Nausea And Vomiting   Trazodone And Nefazodone Other (See Comments)   Extremely drowsy the next day after taking.        Medication List     STOP taking these medications    losartan 50 MG tablet Commonly known as: COZAAR   metoprolol tartrate 50 MG tablet Commonly known as: LOPRESSOR   traMADol 50 MG tablet Commonly known as: ULTRAM       TAKE these medications    amiodarone 100 MG tablet Commonly known as: PACERONE Take 1 tablet (100 mg total) by mouth daily. What changed:  medication strength See the new instructions.   cholecalciferol 25 MCG (1000 UNIT) tablet Commonly known as: VITAMIN D Take 2,000 Units by mouth daily with breakfast.   Fish Oil 1200 MG Caps Take 2,400 mg by mouth daily.   furosemide 40 MG tablet Commonly known as: LASIX Take 1 tablet (40 mg total) by mouth daily. What changed:  medication strength how much to take   levocetirizine 5 MG tablet Commonly known as: XYZAL Take 5 mg by mouth daily as needed.   lovastatin 40 MG tablet Commonly known as: MEVACOR Take 1 tablet (40 mg total) by mouth at bedtime.   metoprolol succinate 50 MG 24 hr tablet Commonly known as: TOPROL-XL Take 1 tablet (50 mg total) by mouth 2 (two) times daily. Take with or immediately following a meal.   multivitamin with minerals Tabs tablet Take 1 tablet by mouth daily. One-A-Day for Men 50+   nitroGLYCERIN 0.4 MG SL tablet Commonly known as: Nitrostat PLACE 1  TABLET (0.4 MG TOTAL) UNDER THE TONGUE EVERY 5 (FIVE) MINUTES AS NEEDED FOR CHEST PAIN.   omeprazole 20 MG capsule Commonly known as: PRILOSEC Take 1 capsule (20 mg total) by mouth 2 (two) times daily before a meal.   PHILLIPS COLON HEALTH PO Take 1 capsule by mouth daily as needed.   potassium chloride SA 20 MEQ tablet Commonly known as: KLOR-CON M Take 2 tablets (40 mEq total) by mouth daily. Start taking on: February 11, 2021  PRESERVISION AREDS 2 PO Take 1 tablet by mouth in the morning and at bedtime.   spironolactone 25 MG tablet Commonly known as: ALDACTONE Take 1 tablet (25 mg total) by mouth daily. Start taking on: February 11, 2021   warfarin 5 MG tablet Commonly known as: COUMADIN Take as directed. If you are unsure how to take this medication, talk to your nurse or doctor. Original instructions: TAKE 1 TABLET EVERY DAY        Procedures/Studies: CT ABDOMEN PELVIS WO CONTRAST  Result Date: 02/06/2021 CLINICAL DATA:  Worsening abdominal pain, shortness of breath EXAM: CT ABDOMEN AND PELVIS WITHOUT CONTRAST TECHNIQUE: Multidetector CT imaging of the abdomen and pelvis was performed following the standard protocol without IV contrast.This exam was performed according to the departmental dose-optimization program which includes automated exposure control, adjustment of the mA and/or kV according to patient size and/or use of iterative reconstruction technique. COMPARISON:  09/19/2012 FINDINGS: Lower chest: Moderate right and small left pleural effusions. Partially calcified left pleural plaque. Dependent atelectasis/consolidation posteriorly in the lung bases right greater than left. Extensive coronary arterial calcifications. Hepatobiliary: No focal liver abnormality is seen. No gallstones, gallbladder wall thickening, or biliary dilatation. Pancreas: Parenchymal atrophy. No mass, ductal dilatation, or regional inflammatory change. Spleen: Normal in size.  Multiple small  calcified granulomas. Adrenals/Urinary Tract: No adrenal mass. No urolithiasis or hydronephrosis. Urinary bladder physiologically distended. Stomach/Bowel: Stomach and small bowel are nondistended. The colon is nondilated. A few scattered sigmoid diverticula without significant adjacent inflammatory change. Vascular/Lymphatic: Calcified atheromatous plaque in the abdominal aorta involving visceral and renal branches, without aneurysm. No abdominal or pelvic adenopathy. Reproductive: Prostate is unremarkable. Other: No ascites.  No free air. Musculoskeletal: Sternotomy wires. Spondylitic changes throughout the lumbar spine. IMPRESSION: 1. Moderate right and small left pleural effusions. 2. Coronary and Aortic Atherosclerosis (ICD10-170.0). 3. Sigmoid diverticulosis Electronically Signed   By: Lucrezia Europe M.D.   On: 02/06/2021 14:03   CT HEAD WO CONTRAST  Result Date: 02/05/2021 CLINICAL DATA:  Mental status change, unknown cause EXAM: CT HEAD WITHOUT CONTRAST TECHNIQUE: Contiguous axial images were obtained from the base of the skull through the vertex without intravenous contrast. RADIATION DOSE REDUCTION: This exam was performed according to the departmental dose-optimization program which includes automated exposure control, adjustment of the mA and/or kV according to patient size and/or use of iterative reconstruction technique. COMPARISON:  None. FINDINGS: Brain: No evidence of acute intracranial hemorrhage or extra-axial collection.No evidence of mass lesion/concern mass effect.The ventricles are normal in size.Scattered subcortical and periventricular white matter hypodensities, nonspecific but likely sequela of chronic small vessel ischemic disease. Vascular: No hyperdense vessel or unexpected calcification. Skull: Normal. Negative for fracture or focal lesion. Sinuses/Orbits: There is mucosal thickening of the left maxillary sinus with frothy material. The orbits are unremarkable. Other: None IMPRESSION:  No acute intracranial abnormality. Mild sequela of chronic small vessel ischemic disease. Electronically Signed   By: Maurine Simmering M.D.   On: 02/05/2021 15:12   US Venous Img Lower Bilateral (DVT)  Result Date: 02/06/2021 CLINICAL DATA:  increasing shortness of breath and dyspnea on exertion for the better part of a month which has worsened over the past week. increasing confusion for the past month which has significantly worsened over the past week prior to admission. decreased oral intake and increasing generalized weakness to the point where he was having difficulty getting out of bed for 2 days prior to this admission. No fevers, chills, chest pain, vomiting, diarrhea. nauseous and has had  decreased oral intake. constipation and abdominal discomfort which appears to be chronic. Hyponatremia, leg cramping x1 day EXAM: BILATERAL LOWER EXTREMITY VENOUS DOPPLER ULTRASOUND TECHNIQUE: Gray-scale sonography with compression, as well as color and duplex ultrasound, were performed to evaluate the deep venous system(s) from the level of the common femoral vein through the popliteal and proximal calf veins. COMPARISON:  09/01/2006 right, by report only FINDINGS: VENOUS Normal compressibility of the common femoral, superficial femoral, and popliteal veins, as well as the visualized calf veins. Visualized portions of profunda femoral vein and great saphenous vein unremarkable. No filling defects to suggest DVT on grayscale or color Doppler imaging. Doppler waveforms show normal direction of venous flow, normal respiratory phasicity and response to augmentation. OTHER None. Limitations: none IMPRESSION: No femoropopliteal DVT nor evidence of DVT within the visualized calf veins. If clinical symptoms are inconsistent or if there are persistent or worsening symptoms, further imaging (possibly involving the iliac veins) may be warranted. Electronically Signed   By: Lucrezia Europe M.D.   On: 02/06/2021 13:38   DG Chest Port 1  View  Result Date: 02/08/2021 CLINICAL DATA:  Intermittent shortness of breath, history of AFib, CABG EXAM: PORTABLE CHEST 1 VIEW COMPARISON:  Chest radiograph 02/05/2021 FINDINGS: Median sternotomy wires and mediastinal surgical clips are again noted. The cardiomediastinal silhouette is stable. A small right pleural effusion is again seen with patchy airspace opacity in the right lower lobe, overall slightly improved since 02/05/2021. A trace left pleural effusion is likely not significantly changed. Otherwise, there is no new or worsening focal airspace disease. There is vascular congestion without definite overt pulmonary edema. There is no pneumothorax. The bones are stable. IMPRESSION: 1. Small right pleural effusion with patchy opacities in the right lower lobe, slightly improved since 02/05/2021. Continued radiographic follow up to resolution is recommended. 2. Trace left pleural effusion is likely not significantly changed. 3. No new or worsening focal airspace disease. Electronically Signed   By: Valetta Mole M.D.   On: 02/08/2021 12:12   DG Chest Port 1 View  Result Date: 02/05/2021 CLINICAL DATA:  Atrial fibrillation with rapid ventricular response EXAM: PORTABLE CHEST 1 VIEW COMPARISON:  Portable exam 1343 hours compared to 04/07/2016 FINDINGS: Enlargement of cardiac silhouette post CABG. Mild pulmonary vascular congestion. Atherosclerotic calcification aorta. RIGHT pleural effusion and basilar atelectasis. Remaining lungs clear. No definite infiltrate or pneumothorax. Bones demineralized. IMPRESSION: Enlargement of cardiac silhouette with pulmonary vascular congestion post CABG. New RIGHT pleural effusion and basilar atelectasis of unknown etiology; follow-up radiographs until resolution recommended to exclude underlying abnormalities. Aortic Atherosclerosis (ICD10-I70.0). Electronically Signed   By: Lavonia Dana M.D.   On: 02/05/2021 13:57   ECHOCARDIOGRAM COMPLETE  Result Date: 02/06/2021     ECHOCARDIOGRAM REPORT   Patient Name:   Danny [name]. Date of Exam: 02/06/2021 Medical Rec #:  [id number]         Height:       74.0 in Accession #:    [id number]        Weight:       183.6 lb Date of Birth:  Mar 21, 1931         BSA:          2.096 m Patient Age:    88 years          BP:           155/85 mmHg Patient Gender: M  HR:           93 bpm. Exam Location:  Inpatient Procedure: 2D Echo, 3D Echo, Cardiac Doppler, Color Doppler and Intracardiac            Opacification Agent Indications:    I48.91* Unspeicified atrial fibrillation; I10 Hypertension  History:        Patient has prior history of Echocardiogram examinations. CAD,                 Abnormal ECG and Prior CABG, Pulmonary HTN, Arrythmias:Atrial                 Fibrillation, Signs/Symptoms:Edema, Shortness of Breath, Dyspnea                 and Altered Mental Status; Risk Factors:Hypertension,                 Dyslipidemia and Former Smoker.  Sonographer:    Roseanna Rainbow RDCS Referring Phys: [phone] DAVID TAT  Sonographer Comments: Technically difficult study due to poor echo windows. Patient could not follow instructions. IMPRESSIONS  1. Left ventricular ejection fraction, by estimation, is 40-45%. The left ventricle has mildly decreased function. The left ventricle demonstrates regional wall motion abnormalities (abnormal septal motion). There is mild concentric left ventricular hypertrophy. Left ventricular diastolic parameters are indeterminate. There is the interventricular septum is flattened in systole, consistent with right ventricular pressure overload.  2. Right ventricular systolic function is mildly reduced. The right ventricular size is enlarged. There is moderately elevated pulmonary artery systolic pressure. The estimated right ventricular systolic pressure is 41.3 mmHg.  3. Right atrial size was moderately dilated.  4. The mitral valve is abnormal. No evidence of mitral valve regurgitation. No evidence of mitral stenosis. The  mean mitral valve gradient is 3.0 mmHg with average heart rate of 119 bpm. Mitral valve area by continuity equation 3.3 cm2.  5. Tricuspid valve regurgitation is severe.  6. The aortic valve is calcified. Aortic valve regurgitation is not visualized. Moderate aortic valve stenosis. There is low flow low gradient aortic stenosis, mean gradient 25 mm Hg but in the setting of decreased LV stroke volume. DVI 0.39. Comparison(s): Compared to 2021 study, LVEF and AS are worse; prior study not done in the setting of this tachycardia. FINDINGS  Left Ventricle: Left ventricular ejection fraction, by estimation, is 40 to 45%. The left ventricle has mildly decreased function. The left ventricle demonstrates regional wall motion abnormalities. Definity contrast agent was given IV to delineate the left ventricular endocardial borders. The left ventricular internal cavity size was normal in size. There is mild concentric left ventricular hypertrophy. Abnormal (paradoxical) septal motion consistent with post-operative status and the interventricular  septum is flattened in systole, consistent with right ventricular pressure overload. Left ventricular diastolic parameters are indeterminate. Right Ventricle: The right ventricular size is mildly enlarged. No increase in right ventricular wall thickness. Right ventricular systolic function is mildly reduced. There is moderately elevated pulmonary artery systolic pressure. The tricuspid regurgitant velocity is 3.08 m/s, and with an assumed right atrial pressure of 15 mmHg, the estimated right ventricular systolic pressure is 24.4 mmHg. Left Atrium: Left atrial size was normal in size. Right Atrium: Right atrial size was moderately dilated. Pericardium: There is no evidence of pericardial effusion. Mitral Valve: The mitral valve is abnormal. No evidence of mitral valve regurgitation. No evidence of mitral valve stenosis. MV peak gradient, 9.2 mmHg. The mean mitral valve gradient is 3.0  mmHg with average heart rate of  119 bpm. Tricuspid Valve: The tricuspid valve is normal in structure. Tricuspid valve regurgitation is severe. Aortic Valve: The aortic valve is calcified. Aortic valve regurgitation is not visualized. Moderate aortic stenosis is present. Aortic valve mean gradient measures 25.0 mmHg. Aortic valve peak gradient measures 39.1 mmHg. Aortic valve area, by VTI measures 0.94 cm. Pulmonic Valve: The pulmonic valve was not well visualized. Pulmonic valve regurgitation is not visualized. No evidence of pulmonic stenosis. Aorta: The aortic root and ascending aorta are structurally normal, with no evidence of dilitation. IAS/Shunts: No atrial level shunt detected by color flow Doppler.  LEFT VENTRICLE PLAX 2D LVIDd:         4.70 cm LVIDs:         3.80 cm LV PW:         1.30 cm LV IVS:        1.20 cm LVOT diam:     2.00 cm LV SV:         62 LV SV Index:   29 LVOT Area:     3.14 cm  LV Volumes (MOD) LV vol d, MOD A2C: 60.4 ml LV vol d, MOD A4C: 87.5 ml LV vol s, MOD A2C: 36.0 ml LV vol s, MOD A4C: 49.0 ml LV SV MOD A2C:     24.4 ml LV SV MOD A4C:     87.5 ml LV SV MOD BP:      33.2 ml RIGHT VENTRICLE            IVC RV S prime:     6.98 cm/s  IVC diam: 3.50 cm TAPSE (M-mode): 0.8 cm LEFT ATRIUM             Index        RIGHT ATRIUM           Index LA diam:        3.95 cm 1.88 cm/m   RA Area:     29.50 cm LA Vol (A2C):   59.5 ml 28.39 ml/m  RA Volume:   94.40 ml  45.04 ml/m LA Vol (A4C):   55.9 ml 26.67 ml/m LA Biplane Vol: 61.5 ml 29.34 ml/m  AORTIC VALVE AV Area (Vmax):    1.30 cm AV Area (Vmean):   1.16 cm AV Area (VTI):     0.94 cm AV Vmax:           312.60 cm/s AV Vmean:          219.000 cm/s AV VTI:            0.656 m AV Peak Grad:      39.1 mmHg AV Mean Grad:      25.0 mmHg LVOT Vmax:         129.00 cm/s LVOT Vmean:        80.800 cm/s LVOT VTI:          0.196 m LVOT/AV VTI ratio: 0.30  AORTA Ao Root diam: 3.40 cm Ao Asc diam:  3.20 cm MITRAL VALVE              TRICUSPID VALVE MV  Area (PHT): 3.91 cm   TR Peak grad:   37.9 mmHg MV Area VTI:   3.17 cm   TR Vmax:        308.00 cm/s MV Peak grad:  9.2 mmHg MV Mean grad:  3.0 mmHg   SHUNTS MV Vmax:       1.52 m/s   Systemic VTI:  0.20 m MV Vmean:  76.9 cm/s  Systemic Diam: 2.00 cm MV Decel Time: 194 msec Rudean Haskell MD Electronically signed by Rudean Haskell MD Signature Date/Time: 02/06/2021/2:36:02 PM    Final      Subjective: Pt reports feeling much better and is agreeable to going home with close outpatient follow up.   Discharge Exam: Vitals:   02/10/21 0105 02/10/21 0543  BP: (!) 144/107 124/78  Pulse: 78 82  Resp: 18 19  Temp: 97.6 F (36.4 C) 97.7 F (36.5 C)  SpO2: 96% 99%   Vitals:   02/10/21 0000 02/10/21 0007 02/10/21 0105 02/10/21 0543  BP: 118/73  (!) 144/107 124/78  Pulse: 100  78 82  Resp: 14  18 19   Temp:  97.7 F (36.5 C) 97.6 F (36.4 C) 97.7 F (36.5 C)  TempSrc:  Oral Oral Oral  SpO2: 94%  96% 99%  Weight:   80.3 kg 80.3 kg  Height:       General: Pt is alert, awake, not in acute distress Cardiovascular: normal S1/S2 +, no rubs, no gallops Respiratory: CTA bilaterally, no wheezing, no rhonchi Abdominal: Soft, NT, ND, bowel sounds + Extremities: trace pretibial edema, no cyanosis   The results of significant diagnostics from this hospitalization (including imaging, microbiology, ancillary and laboratory) are listed below for reference.     Microbiology: Recent Results (from the past 240 hour(s))  Resp Panel by RT-PCR (Flu A&B, Covid) Nasopharyngeal Swab     Status: None   Collection Time: 02/05/21  2:09 PM   Specimen: Nasopharyngeal Swab; Nasopharyngeal(NP) swabs in vial transport medium  Result Value Ref Range Status   SARS Coronavirus 2 by RT PCR NEGATIVE NEGATIVE Final    Comment: (NOTE) SARS-CoV-2 target nucleic acids are NOT DETECTED.  The SARS-CoV-2 RNA is generally detectable in upper respiratory specimens during the acute phase of infection. The  lowest concentration of SARS-CoV-2 viral copies this assay can detect is 138 copies/mL. A negative result does not preclude SARS-Cov-2 infection and should not be used as the sole basis for treatment or other patient management decisions. A negative result may occur with  improper specimen collection/handling, submission of specimen other than nasopharyngeal swab, presence of viral mutation(s) within the areas targeted by this assay, and inadequate number of viral copies(<138 copies/mL). A negative result must be combined with clinical observations, patient history, and epidemiological information. The expected result is Negative.  Fact Sheet for Patients:  EntrepreneurPulse.com.au  Fact Sheet for Healthcare Providers:  IncredibleEmployment.be  This test is no t yet approved or cleared by the [place] FDA and  has been authorized for detection and/or diagnosis of SARS-CoV-2 by FDA under an Emergency Use Authorization (EUA). This EUA will remain  in effect (meaning this test can be used) for the duration of the COVID-19 declaration under Section 564(b)(1) of the Act, 21 U.S.C.section 360bbb-3(b)(1), unless the authorization is terminated  or revoked sooner.       Influenza A by PCR NEGATIVE NEGATIVE Final   Influenza B by PCR NEGATIVE NEGATIVE Final    Comment: (NOTE) The Xpert Xpress SARS-CoV-2/FLU/RSV plus assay is intended as an aid in the diagnosis of influenza from Nasopharyngeal swab specimens and should not be used as a sole basis for treatment. Nasal washings and aspirates are unacceptable for Xpert Xpress SARS-CoV-2/FLU/RSV testing.  Fact Sheet for Patients: EntrepreneurPulse.com.au  Fact Sheet for Healthcare Providers: IncredibleEmployment.be  This test is not yet approved or cleared by the [place] FDA and has been authorized for detection and/or diagnosis of  SARS-CoV-2 by FDA under  an Emergency Use Authorization (EUA). This EUA will remain in effect (meaning this test can be used) for the duration of the COVID-19 declaration under Section 564(b)(1) of the Act, 21 U.S.C. section 360bbb-3(b)(1), unless the authorization is terminated or revoked.  Performed at [facility], [address], [place], [place] [address]   Blood Cultures (routine x 2)     Status: None   Collection Time: 02/05/21  2:35 PM   Specimen: BLOOD RIGHT ARM  Result Value Ref Range Status   Specimen Description   Final    BLOOD RIGHT ARM BOTTLES DRAWN AEROBIC AND ANAEROBIC   Special Requests Blood Culture adequate volume  Final   Culture   Final    NO GROWTH 5 DAYS Performed at [facility], [address] [place], [place] [address]    Report Status 02/10/2021 FINAL  Final  Blood Cultures (routine x 2)     Status: None   Collection Time: 02/05/21  2:35 PM   Specimen: BLOOD LEFT ARM  Result Value Ref Range Status   Specimen Description BLOOD LEFT ARM BOTTLES DRAWN AEROBIC AND ANAEROBIC  Final   Special Requests Blood Culture adequate volume  Final   Culture   Final    NO GROWTH 5 DAYS Performed at [facility], [address] [place], [place] [address]    Report Status 02/10/2021 FINAL  Final  MRSA Next Gen by PCR, Nasal     Status: None   Collection Time: 02/05/21 10:05 PM   Specimen: Nasal Mucosa; Nasal Swab  Result Value Ref Range Status   MRSA by PCR Next Gen NOT DETECTED NOT DETECTED Final    Comment: (NOTE) The GeneXpert MRSA Assay (FDA approved for NASAL specimens only), is one component of a comprehensive MRSA colonization surveillance program. It is not intended to diagnose MRSA infection nor to guide or monitor treatment for MRSA infections. Test performance is not FDA approved in patients less than 77 years old. Performed at [name], [address] [place], [place] [address]   Urine Culture     Status: None   Collection Time: 02/06/21 10:47 AM   Specimen: Urine, Clean  Catch  Result Value Ref Range Status   Specimen Description   Final    URINE, CLEAN CATCH Performed at [facility], [address] [place], [place] [address]    Special Requests   Final    NONE Performed at [facility], [address] [place], [place] [address]    Culture   Final    NO GROWTH Performed at [facility], [place] [address] [place],  [address]    Report Status 02/08/2021 FINAL  Final     Labs: BNP (last 3 results) Recent Labs    02/06/21 0903 02/09/21 0406  BNP 491.0* 361.4*   Basic Metabolic Panel: Recent Labs  Lab 02/06/21 0525 02/06/21 0903 02/07/21 0430 02/08/21 0422 02/09/21 0406 02/10/21 0604  NA 126*  --  124* 125* 128* 131*  K 3.9  --  3.8 3.0* 3.9 3.1*  CL 93*  --  92* 92* 94* 91*  CO2 22  --  22 24 23 28   GLUCOSE 99  --  109* 104* 103* 99  BUN 21  --  17 21 18 17   CREATININE 0.99  --  0.80 0.88 0.78 0.90  CALCIUM 8.9  --  8.7* 8.1* 8.3* 8.5*  MG 1.8 1.8 1.7 1.6* 1.9 1.9   Liver Function Tests: Recent  Labs  Lab 02/05/21 1435 02/07/21 0430 02/08/21 0422 02/09/21 0406 02/10/21 0604  AST 51* 43* 36 40 43*  ALT 34 27 26 28  35  ALKPHOS 198* 140* 133* 142* 151*  BILITOT 1.4* 1.7* 1.8* 2.1* 1.7*  PROT 7.7 6.1* 5.7* 5.9* 5.9*  ALBUMIN 4.1 3.3* 3.0* 3.0* 3.1*   Recent Labs  Lab 02/06/21 0903  LIPASE 36   Recent Labs  Lab 02/05/21 1435  AMMONIA 22   CBC: Recent Labs  Lab 02/06/21 0525 02/07/21 0430 02/08/21 0422 02/09/21 0406 02/10/21 0604  WBC 8.8 8.8 7.7 6.6 5.5  HGB 13.7 12.5* 12.3* 12.7* 12.6*  HCT 37.7* 37.0* 35.6* 37.9* 36.4*  MCV 90.2 90.5 89.2 90.5 92.9  PLT 186 168 171 174 200   Cardiac Enzymes: No results for input(s): CKTOTAL, CKMB, CKMBINDEX, TROPONINI in the last 168 hours. BNP: Invalid input(s): POCBNP CBG: Recent Labs  Lab 02/05/21 1338  GLUCAP 104*   D-Dimer No results for input(s): DDIMER in the last 72 hours. Hgb A1c No results for input(s): HGBA1C in the last 72 hours. Lipid  Profile No results for input(s): CHOL, HDL, LDLCALC, TRIG, CHOLHDL, LDLDIRECT in the last 72 hours. Thyroid function studies No results for input(s): TSH, T4TOTAL, T3FREE, THYROIDAB in the last 72 hours.  Invalid input(s): FREET3 Anemia work up No results for input(s): VITAMINB12, FOLATE, FERRITIN, TIBC, IRON, RETICCTPCT in the last 72 hours. Urinalysis    Component Value Date/Time   COLORURINE YELLOW 02/06/2021 1047   APPEARANCEUR CLEAR 02/06/2021 1047   APPEARANCEUR Clear 05/05/2017 1346   LABSPEC 1.020 02/06/2021 1047   PHURINE 5.5 02/06/2021 [place] 02/06/2021 1047   HGBUR NEGATIVE 02/06/2021 [place] 02/06/2021 1047   BILIRUBINUR Negative 05/05/2017 1346   KETONESUR NEGATIVE 02/06/2021 1047   PROTEINUR NEGATIVE 02/06/2021 1047   UROBILINOGEN negative 02/11/2015 1142   UROBILINOGEN 0.2 09/19/2012 2034   NITRITE NEGATIVE 02/06/2021 1047   LEUKOCYTESUR NEGATIVE 02/06/2021 1047   Sepsis Labs Invalid input(s): PROCALCITONIN,  WBC,  LACTICIDVEN Microbiology Recent Results (from the past 240 hour(s))  Resp Panel by RT-PCR (Flu A&B, Covid) Nasopharyngeal Swab     Status: None   Collection Time: 02/05/21  2:09 PM   Specimen: Nasopharyngeal Swab; Nasopharyngeal(NP) swabs in vial transport medium  Result Value Ref Range Status   SARS Coronavirus 2 by RT PCR NEGATIVE NEGATIVE Final    Comment: (NOTE) SARS-CoV-2 target nucleic acids are NOT DETECTED.  The SARS-CoV-2 RNA is generally detectable in upper respiratory specimens during the acute phase of infection. The lowest concentration of SARS-CoV-2 viral copies this assay can detect is 138 copies/mL. A negative result does not preclude SARS-Cov-2 infection and should not be used as the sole basis for treatment or other patient management decisions. A negative result may occur with  improper specimen collection/handling, submission of specimen other than nasopharyngeal swab, presence of viral  mutation(s) within the areas targeted by this assay, and inadequate number of viral copies(<138 copies/mL). A negative result must be combined with clinical observations, patient history, and epidemiological information. The expected result is Negative.  Fact Sheet for Patients:  EntrepreneurPulse.com.au  Fact Sheet for Healthcare Providers:  IncredibleEmployment.be  This test is no t yet approved or cleared by the [place] FDA and  has been authorized for detection and/or diagnosis of SARS-CoV-2 by FDA under an Emergency Use Authorization (EUA). This EUA will remain  in effect (meaning this test can be used) for the duration of the COVID-19 declaration  under Section 564(b)(1) of the Act, 21 U.S.C.section 360bbb-3(b)(1), unless the authorization is terminated  or revoked sooner.       Influenza A by PCR NEGATIVE NEGATIVE Final   Influenza B by PCR NEGATIVE NEGATIVE Final    Comment: (NOTE) The Xpert Xpress SARS-CoV-2/FLU/RSV plus assay is intended as an aid in the diagnosis of influenza from Nasopharyngeal swab specimens and should not be used as a sole basis for treatment. Nasal washings and aspirates are unacceptable for Xpert Xpress SARS-CoV-2/FLU/RSV testing.  Fact Sheet for Patients: EntrepreneurPulse.com.au  Fact Sheet for Healthcare Providers: IncredibleEmployment.be  This test is not yet approved or cleared by the [place] FDA and has been authorized for detection and/or diagnosis of SARS-CoV-2 by FDA under an Emergency Use Authorization (EUA). This EUA will remain in effect (meaning this test can be used) for the duration of the COVID-19 declaration under Section 564(b)(1) of the Act, 21 U.S.C. section 360bbb-3(b)(1), unless the authorization is terminated or revoked.  Performed at [facility], [address], [place], [place] [address]   Blood Cultures (routine x 2)     Status:  None   Collection Time: 02/05/21  2:35 PM   Specimen: BLOOD RIGHT ARM  Result Value Ref Range Status   Specimen Description   Final    BLOOD RIGHT ARM BOTTLES DRAWN AEROBIC AND ANAEROBIC   Special Requests Blood Culture adequate volume  Final   Culture   Final    NO GROWTH 5 DAYS Performed at [facility], [address] [place], [place] [address]    Report Status 02/10/2021 FINAL  Final  Blood Cultures (routine x 2)     Status: None   Collection Time: 02/05/21  2:35 PM   Specimen: BLOOD LEFT ARM  Result Value Ref Range Status   Specimen Description BLOOD LEFT ARM BOTTLES DRAWN AEROBIC AND ANAEROBIC  Final   Special Requests Blood Culture adequate volume  Final   Culture   Final    NO GROWTH 5 DAYS Performed at [facility], [address], [place], [place] [address]    Report Status 02/10/2021 FINAL  Final  MRSA Next Gen by PCR, Nasal     Status: None   Collection Time: 02/05/21 10:05 PM   Specimen: Nasal Mucosa; Nasal Swab  Result Value Ref Range Status   MRSA by PCR Next Gen NOT DETECTED NOT DETECTED Final    Comment: (NOTE) The GeneXpert MRSA Assay (FDA approved for NASAL specimens only), is one component of a comprehensive MRSA colonization surveillance program. It is not intended to diagnose MRSA infection nor to guide or monitor treatment for MRSA infections. Test performance is not FDA approved in patients less than 80 years old. Performed at [facility], [address] [place], [place] [address]   Urine Culture     Status: None   Collection Time: 02/06/21 10:47 AM   Specimen: Urine, Clean Catch  Result Value Ref Range Status   Specimen Description   Final    URINE, CLEAN CATCH Performed at [facility], [address], [place], [place] [address]    Special Requests   Final    NONE Performed at [facility], [address], [place], [place] [address]    Culture   Final    NO GROWTH Performed at [facility], [place] [address] [place], [place] [address]     Report Status 02/08/2021 FINAL  Final   Time coordinating discharge: 37 mins   SIGNED:  Irwin Brakeman, MD  [facility] 02/10/2021, 11:05 AM How to contact the [facility] Attending or Consulting provider [place] or covering provider during after hours [place], for this patient?  Check the care team in [facility] and look for a) attending/consulting [facility] provider listed and b) the [facility] team listed Log into [email or web address] and use [facility] universal password to access. If you do not have the password, please contact the hospital operator. Locate the [facility] provider you are looking for under [facility] and page to a number that you can be directly reached. If you still have difficulty reaching the provider, please page the [facility] (Director on Call) for the Hospitalists listed on amion for assistance.

## 2021-02-11 ENCOUNTER — Telehealth: Payer: Self-pay

## 2021-02-11 NOTE — Telephone Encounter (Signed)
Transition Care Management Follow-up Telephone Call Date of discharge and from where: [facility] 02-10-21 Dx: hyponatremia/A-fib with RVR How have you been since you were released from the hospital? A little confused and having short term memory loss  Any questions or concerns? Yes- niece is concerned about his short term memory loss and confusion -niece also states that he drinks 3-4 beers per day prior to the hospital but since being home she has only allowed him 1 beer per day  Items Reviewed: Did the pt receive and understand the discharge instructions provided? Yes  Medications obtained and verified? Yes  Other? No  Any new allergies since your discharge? No  Dietary orders reviewed? Yes Do you have support at home? Yes   Home Care and Equipment/Supplies: Were home health services ordered? Yes- PT If so, what is the name of the agency? Uknown  Has the agency set up a time to come to the patient's home? no Were any new equipment or medical supplies ordered?  No What is the name of the medical supply agency? na Were you able to get the supplies/equipment? not applicable Do you have any questions related to the use of the equipment or supplies? No  Functional Questionnaire: (I = Independent and D = Dependent) ADLs: I  Bathing/Dressing- I  Meal Prep- I  Eating- I  Maintaining continence- I  Transferring/Ambulation- D  Managing Meds- D  Follow up appointments reviewed:  PCP Hospital f/u appt confirmed? Yes  [facility]  Scheduled to see Dr Johnsie Cancel on 03-04-21 @ 2pm. Are transportation arrangements needed? No  If their condition worsens, is the pt aware to call PCP or go to the Emergency Dept.? Yes Was the patient provided with contact information for the PCP's office or ED? Yes Was to pt encouraged to call back with questions or concerns? Yes

## 2021-02-16 ENCOUNTER — Ambulatory Visit (INDEPENDENT_AMBULATORY_CARE_PROVIDER_SITE_OTHER): Payer: Medicare HMO | Admitting: Family Medicine

## 2021-02-16 ENCOUNTER — Encounter: Payer: Self-pay | Admitting: Family Medicine

## 2021-02-16 VITALS — BP 121/80 | HR 97 | Temp 98.8°F | Ht 74.0 in | Wt 171.0 lb

## 2021-02-16 DIAGNOSIS — Z7901 Long term (current) use of anticoagulants: Secondary | ICD-10-CM

## 2021-02-16 DIAGNOSIS — Z09 Encounter for follow-up examination after completed treatment for conditions other than malignant neoplasm: Secondary | ICD-10-CM

## 2021-02-16 DIAGNOSIS — I4891 Unspecified atrial fibrillation: Secondary | ICD-10-CM | POA: Diagnosis not present

## 2021-02-16 DIAGNOSIS — D6869 Other thrombophilia: Secondary | ICD-10-CM | POA: Diagnosis not present

## 2021-02-16 DIAGNOSIS — E871 Hypo-osmolality and hyponatremia: Secondary | ICD-10-CM

## 2021-02-16 DIAGNOSIS — I5021 Acute systolic (congestive) heart failure: Secondary | ICD-10-CM | POA: Diagnosis not present

## 2021-02-16 DIAGNOSIS — G9341 Metabolic encephalopathy: Secondary | ICD-10-CM | POA: Diagnosis not present

## 2021-02-16 LAB — COAGUCHEK XS/INR WAIVED
INR: 1.1 (ref 0.9–1.1)
Prothrombin Time: 13.7 s

## 2021-02-16 LAB — POCT INR: INR: 1.1 — AB (ref 2.0–3.0)

## 2021-02-16 NOTE — Progress Notes (Signed)
Subjective:  Patient ID: Danny Loron., male    DOB: 08-Jul-1931, 88 y.o.   MRN: [id number]  Patient Care Team: Loman Brooklyn, FNP as PCP - General (Family Medicine) Josue Hector, MD as PCP - Cardiology (Cardiology) Josue Hector, MD as Attending Physician (Cardiology) [name], MD (Inactive) as Consulting Physician (Gastroenterology) Okey Regal, OD (Optometry) Rexene Alberts, MD (Inactive) as Consulting Physician (Cardiothoracic Surgery) Allyn Kenner, MD as Consulting Physician (Dermatology)   Chief Complaint:  Transitions Of Care (A-fib with RVR)   HPI: Danny Rogus. is a 88 y.o. male presenting on 02/16/2021 for Transitions Of Care (A-fib with RVR)   Pt presents today for hospital discharge follow up visit.  Pt was seen in clinic by this provider on 02/05/2021 for confusion and hypertension. He was noted to be in A-Fib with RVR during visit and hypertensive with confusion so he was sent to ED via EMS. He was admitted due to hyponatremia, A-Fib RVR, acute metabolic encephalopathy, acute systolic heart failure, hypomagnesemia, and CAD. His INR was supratherapeutic and he was treated with Vit K and INR returned to normal. He was discharged home on 5 mg nightly dosing but has missed 2 doses since being home. Hyponatremia and hypomagnesemia was thought to be caused by Cozaar and lack of intake. Sodium and magnesium was repleted during visit and returned to baseline prior to discharge. HR was controlled with IV lopressor and pt BB dose was adjusted prior to discharge. Echo was completed during hospitalization and revealed: 02/06/21 echo--EF 40-45%, mod AS, RVSP 52.9, mod decrease RV, severe TR. He has follow up with cardiology scheduled. Niece is primary care giver and states pt is improving slowly since being home, he still has confusion and short term memory loss. He is regaining his strength. Denies any chest pain, shortness of breath, headaches, or dizziness. No  abnormal bleeding or bruising.    Relevant past medical, surgical, family, and social history reviewed and updated as indicated.  Allergies and medications reviewed and updated. Data reviewed: Chart in Epic.   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 01/14/2014   Vitamin D deficiency     Past Surgical History:  Procedure Laterality Date   CARDIOVERSION N/A 05/05/2016   Procedure: CARDIOVERSION;  Surgeon: Josue Hector, MD;  Location: [facility];  Service: Cardiovascular;  Laterality: N/A;   CAROTID ENDARTERECTOMY Left 2003   CAROTID-SUBCLAVIAN BYPASS GRAFT Right    COLONOSCOPY  2005   hyperplastic polyp x 1 with no adenoma   COLONOSCOPY N/A 04/11/2014   [id number] diverticulosis in the sigmoid colon/left colon is redundant   CORONARY ARTERY BYPASS GRAFT     ESOPHAGEAL DILATION N/A 04/11/2014   Procedure: ESOPHAGEAL DILATION;  Surgeon: [name], MD;  [facility];  Service: Endoscopy;  Laterality: N/A;   ESOPHAGOGASTRODUODENOSCOPY N/A 04/11/2014   [id number] at the gastro junction/moderate erosive/duodenal web   EYE SURGERY Bilateral    cataracts   RIGHT/LEFT HEART CATH AND CORONARY/GRAFT ANGIOGRAPHY N/A 02/11/2020   Procedure: RIGHT/LEFT  HEART CATH AND CORONARY/GRAFT ANGIOGRAPHY;  Surgeon: Martinique, Peter M, MD;  Location: [facility];  Service: Cardiovascular;  Laterality: N/A;   SKIN CANCER EXCISION      Social History   Socioeconomic History   Marital status: Married    Spouse name: ruby   Number of children: 1   Years of education: Not on file   Highest education level:  Not on file  Occupational History   Occupation: retired    Fish farm manager: [employer]  Tobacco Use   Smoking status: Former    Types: Cigarettes    Start date: 01/24/1950    Quit date: 01/24/1961    Years since quitting: 60.1   Smokeless tobacco: Never  Vaping Use   Vaping Use: Never used  Substance and Sexual Activity   Alcohol use: Not Currently    Alcohol/week: 0.0 standard drinks    Comment: not drank since 12/2009   Drug use: No   Sexual activity: Yes    Partners: Female  Other Topics Concern   Not on file  Social History Narrative   Not on file   Social Determinants of Health   Financial Resource Strain: Not on file  Food Insecurity: Not on file  Transportation Needs: Not on file  Physical Activity: Not on file  Stress: Not on file  Social Connections: Not on file  Intimate Partner Violence: Not on file    Outpatient Encounter Medications as of 02/16/2021  Medication Sig   amiodarone (PACERONE) 100 MG tablet Take 1 tablet (100 mg total) by mouth daily.   cholecalciferol (VITAMIN D) 25 MCG (1000 UNIT) tablet Take 2,000 Units by mouth daily with breakfast.   furosemide (LASIX) 40 MG tablet Take 1 tablet (40 mg total) by mouth daily.   levocetirizine (XYZAL) 5 MG tablet Take 5 mg by mouth daily as needed.   lovastatin (MEVACOR) 40 MG tablet Take 1 tablet (40 mg total) by mouth at bedtime.   metoprolol succinate (TOPROL-XL) 50 MG 24 hr tablet Take 1 tablet (50 mg total) by mouth 2 (two) times daily. Take with or immediately following a meal.   Multiple Vitamins-Minerals (PRESERVISION AREDS 2 PO) Take 1 tablet by mouth in the morning and at bedtime.   nitroGLYCERIN (NITROSTAT) 0.4 MG SL tablet PLACE 1 TABLET (0.4 MG TOTAL) UNDER THE TONGUE EVERY 5 (FIVE) MINUTES AS NEEDED FOR CHEST PAIN.   Omega-3 Fatty Acids (FISH OIL) 1200 MG CAPS Take 2,400 mg by mouth daily.   omeprazole (PRILOSEC) 20 MG capsule Take 1 capsule (20 mg total) by mouth 2 (two) times daily before a meal.   potassium  chloride SA (KLOR-CON M) 20 MEQ tablet Take 2 tablets (40 mEq total) by mouth daily.   Probiotic Product (PHILLIPS COLON HEALTH PO) Take 1 capsule by mouth daily as needed.   spironolactone (ALDACTONE) 25 MG tablet Take 1 tablet (25 mg total) by mouth daily.   warfarin (COUMADIN) 5 MG tablet TAKE 1 TABLET EVERY DAY   No facility-administered encounter medications on file as of 02/16/2021.    Allergies  Allergen Reactions   Doxycycline Diarrhea   Contrast Media [Iodinated Contrast Media]     Syncope   Lunesta [Eszopiclone]     "felt WILD"   Morphine And Related Nausea And Vomiting   Trazodone And Nefazodone Other (See Comments)    Extremely drowsy the next day after taking.    Review of Systems  Constitutional:  Negative for activity change, appetite change, chills, diaphoresis, fatigue, fever and  unexpected weight change.  Respiratory:  Negative for cough and shortness of breath.   Cardiovascular:  Negative for chest pain, palpitations and leg swelling.  Genitourinary:  Negative for decreased urine volume.  Neurological:  Positive for weakness (generalized, slowly improving per family).  Psychiatric/Behavioral:  Positive for confusion.   All other systems reviewed and are negative.      Objective:  BP 121/80    Pulse 97    Temp 98.8 F (37.1 C)    Ht 6' 2"  (1.88 m)    Wt 171 lb (77.6 kg)    SpO2 100%    BMI 21.96 kg/m    Wt Readings from Last 3 Encounters:  02/16/21 171 lb (77.6 kg)  02/10/21 177 lb 0.5 oz (80.3 kg)  02/05/21 187 lb (84.8 kg)    Physical Exam Vitals and nursing note reviewed.  Constitutional:      General: He is not in acute distress.    Appearance: Normal appearance. He is normal weight. He is not ill-appearing, toxic-appearing or diaphoretic.  HENT:     Head: Normocephalic and atraumatic.  Eyes:     Conjunctiva/sclera: Conjunctivae normal.     Pupils: Pupils are equal, round, and reactive to light.  Neck:     Vascular: Normal carotid pulses.  Carotid bruit present. No hepatojugular reflux or JVD.     Trachea: Trachea and phonation normal.  Cardiovascular:     Rate and Rhythm: Normal rate. Rhythm irregularly irregular.     Heart sounds: Murmur heard.  Systolic murmur is present with a grade of 3/6.  Pulmonary:     Effort: Pulmonary effort is normal.     Breath sounds: Normal breath sounds.  Abdominal:     General: Bowel sounds are normal. There is no distension.     Palpations: Abdomen is soft.     Tenderness: There is no abdominal tenderness.  Musculoskeletal:     Cervical back: Neck supple.     Right lower leg: No edema.     Left lower leg: No edema.  Skin:    General: Skin is warm and dry.     Capillary Refill: Capillary refill takes less than 2 seconds.  Neurological:     General: No focal deficit present.     Mental Status: He is alert. He is disoriented.  Psychiatric:        Attention and Perception: Attention and perception normal.        Mood and Affect: Mood and affect normal.        Speech: Speech normal.        Behavior: Behavior is cooperative.        Thought Content: Thought content normal.        Cognition and Memory: Memory is impaired. He exhibits impaired recent memory and impaired remote memory.    Results for orders placed or performed in visit on 02/16/21  CoaguChek XS/INR Waived  Result Value Ref Range   INR 1.1 0.9 - 1.1   Prothrombin Time 13.7 sec  POCT INR  Result Value Ref Range   INR 1.1 (A) 2.0 - 3.0       Pertinent labs & imaging results that were available during my care of the patient were reviewed by me and considered in my medical decision making.  Assessment & Plan:  Danny [name] was seen today for transitions of care.  Diagnoses and all orders for this visit:  Hospital discharge follow-up Today's visit was for Transitional Care Management. The patient was  discharged from [facility] on [facility]/10/2021 with a primary diagnosis of hypokalemia, A-Fib RVR, acute metabolic  encephalopathy, acute systolic heart failure, and hypomagnesemia.  Contact with the patient and/or caregiver, by a clinical staff member, was made on 02/11/2021 and was documented as a telephone encounter within the EMR. Through chart review and discussion with the patient I have determined that management of their condition is of high complexity.  Will update labs today.  -     CoaguChek XS/INR Waived -     CBC with Differential/Platelet -     BMP8+EGFR -     Magnesium -     Brain natriuretic peptide -     POCT INR  Acute systolic heart failure (HCC) Echo as noted. No indications of fluid overload in office today. Will repeat labs. Has follow up with cardiology scheduled.  -     CBC with Differential/Platelet -     BMP8+EGFR -     Magnesium -     Brain natriuretic peptide  Atrial fibrillation with RVR (HCC) Long term current use of anticoagulants  Rate controlled today. INR not at goal but pt has missed 2 doses. Pt and niece aware to continue current 5 mg daily dosing and recheck INR in 1 week. Will adjust regimen if warranted.  -     CBC with Differential/Platelet -     BMP8+EGFR -     Magnesium -     Brain natriuretic peptide -     POCT INR  Acute metabolic encephalopathy Confusion slowly improving but still present. Will repeat labs and adjust treatment regimen if warranted. Niece aware if symptoms persist or worsen, to follow up -     CBC with Differential/Platelet -     BMP8+EGFR -     Magnesium  Hypomagnesemia Repeat labs. Treatment if warranted. -     Magnesium  Hyponatremia Repeat labs. Treatment if warranted.  -     BMP8+EGFR     Continue all other maintenance medications.  Follow up plan: Return in about 1 week (around 02/23/2021), or if symptoms worsen or fail to improve, for INR.   Continue healthy lifestyle choices, including diet (rich in fruits, vegetables, and lean proteins, and low in salt and simple carbohydrates) and exercise (at least 30 minutes of  moderate physical activity daily).  Educational handout given for anticoagulation calendar  The above assessment and management plan was discussed with the patient. The patient verbalized understanding of and has agreed to the management plan. Patient is aware to call the clinic if they develop any new symptoms or if symptoms persist or worsen. Patient is aware when to return to the clinic for a follow-up visit. Patient educated on when it is appropriate to go to the emergency department.   Monia Pouch, FNP-C [facility] [phone]

## 2021-02-17 ENCOUNTER — Other Ambulatory Visit: Payer: Self-pay | Admitting: *Deleted

## 2021-02-17 LAB — CBC WITH DIFFERENTIAL/PLATELET
Basophils Absolute: 0.1 10*3/uL (ref 0.0–0.2)
Basos: 2 %
EOS (ABSOLUTE): 0.1 10*3/uL (ref 0.0–0.4)
Eos: 2 %
Hematocrit: 40.4 % (ref 37.5–51.0)
Hemoglobin: 13.7 g/dL (ref 13.0–17.7)
Immature Grans (Abs): 0.1 10*3/uL (ref 0.0–0.1)
Immature Granulocytes: 1 %
Lymphocytes Absolute: 1.2 10*3/uL (ref 0.7–3.1)
Lymphs: 18 %
MCH: 30.6 pg (ref 26.6–33.0)
MCHC: 33.9 g/dL (ref 31.5–35.7)
MCV: 90 fL (ref 79–97)
Monocytes Absolute: 1 10*3/uL — ABNORMAL HIGH (ref 0.1–0.9)
Monocytes: 15 %
Neutrophils Absolute: 4.2 10*3/uL (ref 1.4–7.0)
Neutrophils: 62 %
Platelets: 242 10*3/uL (ref 150–450)
RBC: 4.48 x10E6/uL (ref 4.14–5.80)
RDW: 13.8 % (ref 11.6–15.4)
WBC: 6.8 10*3/uL (ref 3.4–10.8)

## 2021-02-17 LAB — BMP8+EGFR
BUN/Creatinine Ratio: 17 (ref 10–24)
BUN: 19 mg/dL (ref 8–27)
CO2: 25 mmol/L (ref 20–29)
Calcium: 9.1 mg/dL (ref 8.6–10.2)
Chloride: 93 mmol/L — ABNORMAL LOW (ref 96–106)
Creatinine, Ser: 1.12 mg/dL (ref 0.76–1.27)
Glucose: 86 mg/dL (ref 70–99)
Potassium: 5.6 mmol/L — ABNORMAL HIGH (ref 3.5–5.2)
Sodium: 131 mmol/L — ABNORMAL LOW (ref 134–144)
eGFR: 64 mL/min/{1.73_m2} (ref >59)

## 2021-02-17 LAB — BRAIN NATRIURETIC PEPTIDE: BNP: 374.4 pg/mL — ABNORMAL HIGH (ref 0.0–100.0)

## 2021-02-17 LAB — MAGNESIUM: Magnesium: 1.9 mg/dL (ref 1.6–2.3)

## 2021-02-23 ENCOUNTER — Encounter: Payer: Self-pay | Admitting: Family Medicine

## 2021-02-23 ENCOUNTER — Ambulatory Visit (INDEPENDENT_AMBULATORY_CARE_PROVIDER_SITE_OTHER): Payer: Medicare HMO | Admitting: Family Medicine

## 2021-02-23 VITALS — BP 127/82 | HR 81 | Temp 96.8°F | Ht 74.0 in | Wt 165.6 lb

## 2021-02-23 DIAGNOSIS — Z7901 Long term (current) use of anticoagulants: Secondary | ICD-10-CM | POA: Diagnosis not present

## 2021-02-23 DIAGNOSIS — I480 Paroxysmal atrial fibrillation: Secondary | ICD-10-CM

## 2021-02-23 LAB — COAGUCHEK XS/INR WAIVED
INR: 1.6 — ABNORMAL HIGH (ref 0.9–1.1)
Prothrombin Time: 19 s

## 2021-02-23 NOTE — Progress Notes (Signed)
Assessment & Plan:  1-2. Paroxysmal atrial fibrillation (HCC)/Long term (current) use of anticoagulants Description   INR 1.6 today Goal INR 2-3 Take an extra 1/2 tablet on Tuesdays starting today, continue 5 mg all other days Re-check INR in 10 days   - CoaguChek XS/INR Waived   Return in about 10 days (around 03/05/2021) for INR.  [name], MSN, APRN, FNP-C [facility]  Subjective:    Patient ID: Danny [name]., male    DOB: 1931-04-28, 88 y.o.   MRN: [id number]  Patient Care Team: Loman Brooklyn, FNP as PCP - General (Family Medicine) Josue Hector, MD as PCP - Cardiology (Cardiology) Josue Hector, MD as Attending Physician (Cardiology) [name], MD (Inactive) as Consulting Physician (Gastroenterology) Okey Regal, OD (Optometry) Rexene Alberts, MD (Inactive) as Consulting Physician (Cardiothoracic Surgery) Allyn Kenner, MD as Consulting Physician (Dermatology)   Chief Complaint:  Chief Complaint  Patient presents with   Coagulation Disorder    HPI: Danny [name]. is a 88 y.o. male presenting on 02/23/2021 for Coagulation Disorder  Anticoagulation: Patient here for anticoagulation monitoring. He is accompanied by his niece who has been helping him.  Indication: atrial fibrillation Bleeding Signs/Symptoms: None Thromboembolic Signs/Symptoms: None  Missed Coumadin Doses: None Medication Changes: None Dietary Changes:  No Bacterial/Viral Infection:  No  New complaints: None   Social history:  Relevant past medical, surgical, family and social history reviewed and updated as indicated. Interim medical history since our last visit reviewed.  Allergies and medications reviewed and updated.  DATA REVIEWED: CHART IN EPIC  ROS: Negative unless specifically indicated above in HPI.    Current Outpatient Medications:    amiodarone (PACERONE) 100 MG tablet, Take 1 tablet (100 mg total) by mouth daily., Disp: 30  tablet, Rfl: 1   cholecalciferol (VITAMIN D) 25 MCG (1000 UNIT) tablet, Take 2,000 Units by mouth daily with breakfast., Disp: , Rfl:    furosemide (LASIX) 40 MG tablet, Take 1 tablet (40 mg total) by mouth daily., Disp: 30 tablet, Rfl: 1   lovastatin (MEVACOR) 40 MG tablet, Take 1 tablet (40 mg total) by mouth at bedtime., Disp: 90 tablet, Rfl: 3   metoprolol succinate (TOPROL-XL) 50 MG 24 hr tablet, Take 1 tablet (50 mg total) by mouth 2 (two) times daily. Take with or immediately following a meal., Disp: 60 tablet, Rfl: 1   Multiple Vitamins-Minerals (PRESERVISION AREDS 2 PO), Take 1 tablet by mouth in the morning and at bedtime., Disp: , Rfl:    nitroGLYCERIN (NITROSTAT) 0.4 MG SL tablet, PLACE 1 TABLET (0.4 MG TOTAL) UNDER THE TONGUE EVERY 5 (FIVE) MINUTES AS NEEDED FOR CHEST PAIN., Disp: 100 tablet, Rfl: 3   Omega-3 Fatty Acids (FISH OIL) 1200 MG CAPS, Take 2,400 mg by mouth daily., Disp: , Rfl:    omeprazole (PRILOSEC) 20 MG capsule, Take 1 capsule (20 mg total) by mouth 2 (two) times daily before a meal., Disp: 180 capsule, Rfl: 1   potassium chloride SA (KLOR-CON M) 20 MEQ tablet, Take 2 tablets (40 mEq total) by mouth daily., Disp: 60 tablet, Rfl: 1   Probiotic Product ([employer]), Take 1 capsule by mouth daily as needed., Disp: , Rfl:    spironolactone (ALDACTONE) 25 MG tablet, Take 1 tablet (25 mg total) by mouth daily., Disp: 30 tablet, Rfl: 1   warfarin (COUMADIN) 5 MG tablet, TAKE 1 TABLET EVERY DAY, Disp: 90 tablet, Rfl: 0   levocetirizine (XYZAL)  5 MG tablet, Take 5 mg by mouth daily as needed. (Patient not taking: Reported on 02/23/2021), Disp: , Rfl:    Allergies  Allergen Reactions   Doxycycline Diarrhea   Contrast Media [Iodinated Contrast Media]     Syncope   Lunesta [Eszopiclone]     "felt WILD"   Morphine And Related Nausea And Vomiting   Trazodone And Nefazodone Other (See Comments)    Extremely drowsy the next day after taking.   Past Medical History:   Diagnosis Date   Anxiety    Atrial fibrillation ([place])    CAD, ARTERY BYPASS GRAFT 05/19/2008   Qualifier: Diagnosis of  By: Mare Ferrari, RMA, Sherri     Enteritis due to Clostridium difficile, history of 11/21/2012   GERD (gastroesophageal reflux disease)    Hyperlipidemia    Hypertension    Internal carotid artery stenosis    Shingles 06/2020   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 01/14/2014   Vitamin D deficiency     Past Surgical History:  Procedure Laterality Date   CARDIOVERSION N/A 05/05/2016   Procedure: CARDIOVERSION;  Surgeon: Josue Hector, MD;  Location: [place];  Service: Cardiovascular;  Laterality: N/A;   CAROTID ENDARTERECTOMY Left 2003   CAROTID-SUBCLAVIAN BYPASS GRAFT Right    COLONOSCOPY  2005   hyperplastic polyp x 1 with no adenoma   COLONOSCOPY N/A 04/11/2014   AQT:MAUQ diverticulosis in the sigmoid colon/left colon is redundant   CORONARY ARTERY BYPASS GRAFT     ESOPHAGEAL DILATION N/A 04/11/2014   Procedure: ESOPHAGEAL DILATION;  Surgeon: [name], MD;  [facility];  Service: Endoscopy;  Laterality: N/A;   ESOPHAGOGASTRODUODENOSCOPY N/A 04/11/2014   [id number] at the gastro junction/moderate erosive/duodenal web   EYE SURGERY Bilateral    cataracts   RIGHT/LEFT HEART CATH AND CORONARY/GRAFT ANGIOGRAPHY N/A 02/11/2020   Procedure: RIGHT/LEFT HEART CATH AND CORONARY/GRAFT ANGIOGRAPHY;  Surgeon: Martinique, Peter M, MD;  Location: [facility];  Service: Cardiovascular;  Laterality: N/A;   SKIN CANCER EXCISION      Social History   Socioeconomic History   Marital status: Married    Spouse name: Danny [name]   Number of children: 1   Years of education: Not on file   Highest education level: Not on file  Occupational History   Occupation: retired    Fish farm manager: [employer]  Tobacco Use   Smoking status: Former    Types: Cigarettes    Start date: 01/24/1950    Quit date: 01/24/1961    Years since quitting: 60.1   Smokeless tobacco:  Never  Vaping Use   Vaping Use: Never used  Substance and Sexual Activity   Alcohol use: Not Currently    Alcohol/week: 0.0 standard drinks    Comment: not drank since 12/2009   Drug use: No   Sexual activity: Yes    Partners: Female  Other Topics Concern   Not on file  Social History Narrative   Not on file   Social Determinants of Health   Financial Resource Strain: Not on file  Food Insecurity: Not on file  Transportation Needs: Not on file  Physical Activity: Not on file  Stress: Not on file  Social Connections: Not on file  Intimate Partner Violence: Not on file        Objective:    BP 127/82    Pulse 81    Temp (!) 96.8 F (36 C) (Temporal)    Ht 6' 2"  (1.88 m)  Wt 165 lb 9.6 oz (75.1 kg)    SpO2 100%    BMI 21.26 kg/m   Wt Readings from Last 3 Encounters:  02/23/21 165 lb 9.6 oz (75.1 kg)  02/16/21 171 lb (77.6 kg)  02/10/21 177 lb 0.5 oz (80.3 kg)    Physical Exam Vitals reviewed.  Constitutional:      General: He is not in acute distress.    Appearance: Normal appearance. He is normal weight. He is not ill-appearing, toxic-appearing or diaphoretic.  HENT:     Head: Normocephalic and atraumatic.  Eyes:     General: No scleral icterus.       Right eye: No discharge.        Left eye: No discharge.     Conjunctiva/sclera: Conjunctivae normal.  Cardiovascular:     Rate and Rhythm: Normal rate.  Pulmonary:     Effort: Pulmonary effort is normal. No respiratory distress.  Musculoskeletal:        General: Normal range of motion.     Cervical back: Normal range of motion.  Skin:    General: Skin is warm and dry.  Neurological:     Mental Status: He is alert and oriented to person, place, and time. Mental status is at baseline.  Psychiatric:        Mood and Affect: Mood normal.        Behavior: Behavior normal.        Thought Content: Thought content normal.        Judgment: Judgment normal.    Lab Results  Component Value Date   TSH 0.838  02/05/2021   Lab Results  Component Value Date   WBC 6.8 02/16/2021   HGB 13.7 02/16/2021   HCT 40.4 02/16/2021   MCV 90 02/16/2021   PLT 242 02/16/2021   Lab Results  Component Value Date   NA 131 (L) 02/16/2021   K 5.6 (H) 02/16/2021   CO2 25 02/16/2021   GLUCOSE 86 02/16/2021   BUN 19 02/16/2021   CREATININE 1.12 02/16/2021   BILITOT 1.7 (H) 02/10/2021   ALKPHOS 151 (H) 02/10/2021   AST 43 (H) 02/10/2021   ALT 35 02/10/2021   PROT 5.9 (L) 02/10/2021   ALBUMIN 3.1 (L) 02/10/2021   CALCIUM 9.1 02/16/2021   ANIONGAP 12 02/10/2021   EGFR 64 02/16/2021   Lab Results  Component Value Date   CHOL 150 10/28/2020   Lab Results  Component Value Date   HDL 88 10/28/2020   Lab Results  Component Value Date   LDLCALC 50 10/28/2020   Lab Results  Component Value Date   TRIG 58 10/28/2020   Lab Results  Component Value Date   CHOLHDL 1.7 10/28/2020   No results found for: HGBA1C

## 2021-02-25 NOTE — Progress Notes (Signed)
Cardiology Office Note   Date:  03/04/2021   ID:  Danny Loron., DOB Apr 27, 1931, MRN [id number]  PCP:  Loman Brooklyn, FNP Cardiologist:  [name], MD 02/01/2019 tele-visit Electrphysiologist: None [name], MD   No chief complaint on file.   History of Present Illness: Danny Patrone. is a 88 y.o. male with a history of  PVD s/p L CEA w/ R subclavian bpg 2009 and PTCA 2010, CAD s/p CABG 2003 w/ LIMA-LAD, RIMA-RI, SVG-OM3, PAF on coumadin, HTN, HLD, and GERD INR;s have been Rx with no bleeding issues BP is well controlled   Deferred DOAC due to cost  Had dizziness with amiodarone resolved on lower dose of 200 mg daily   Declined [place] at [facility] appointment 11/05/19 and decided to stay On amiodarone for rate control since he felt better   TTE done 11/07/19 with EF 50-55% Mild LAE Severe RAE and RV dysfunction with severe TR Estimated PA around 60 mmHg Mild AS with mean gradient 14 peak 25 mmHg with low DVI 0.2   Duplex 11/07/19 with right ICA stenosis 80-99% but patent CCA to subclavian graft and plaque in LICA   Theres been issues with excess ETOH in past. Has urinary retention and needs indwelling foley for procedures   Cath done 02/11/20 Patent LIMA to LAD Patent SVG OM Atretic RIMA to ramus but fills from circ no obstructive disease in native RCA Normal filling pressures, right heart pressures , LVEDP and CO. Patent right subclavian bypass   Hospitalized 02/05/21 for confusion and elevated BP. With chronic afib at higher rate. 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 Danny [name] has some dementia    Past Medical History:  Diagnosis Date   Anxiety    Atrial fibrillation ([place])    CAD, ARTERY BYPASS GRAFT 05/19/2008    Qualifier: Diagnosis of  By: Mare Ferrari, RMA, Sherri     Enteritis due to Clostridium difficile, history of 11/21/2012   GERD (gastroesophageal reflux disease)    Hyperlipidemia    Hypertension    Internal carotid artery stenosis    Shingles 06/2020   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 01/14/2014   Vitamin D deficiency     Past Surgical History:  Procedure Laterality Date   CARDIOVERSION N/A 05/05/2016   Procedure: CARDIOVERSION;  Surgeon: Josue Hector, MD;  Location: [place];  Service: Cardiovascular;  Laterality: N/A;   CAROTID ENDARTERECTOMY Left 2003   CAROTID-SUBCLAVIAN BYPASS GRAFT Right    COLONOSCOPY  2005   hyperplastic polyp x 1 with no adenoma   COLONOSCOPY N/A 04/11/2014   JHE:RDEY diverticulosis in the sigmoid colon/left colon is redundant   CORONARY ARTERY BYPASS GRAFT     ESOPHAGEAL DILATION N/A 04/11/2014   Procedure: ESOPHAGEAL DILATION;  Surgeon: [name], MD;  [facility];  Service: Endoscopy;  Laterality: N/A;   ESOPHAGOGASTRODUODENOSCOPY N/A 04/11/2014   CXK:[id number] at the gastro junction/moderate erosive/duodenal web   EYE SURGERY Bilateral    cataracts   RIGHT/LEFT HEART CATH AND CORONARY/GRAFT ANGIOGRAPHY N/A 02/11/2020   Procedure: RIGHT/LEFT HEART CATH AND CORONARY/GRAFT ANGIOGRAPHY;  Surgeon: Martinique, Rakeisha Nyce M, MD;  Location: [place] [facility];  Service: Cardiovascular;  Laterality:  N/A;   SKIN CANCER EXCISION      Current Outpatient Medications  Medication Sig Dispense Refill   amiodarone (PACERONE) 100 MG tablet Take 1 tablet (100 mg total) by mouth daily. 30 tablet 1   cholecalciferol (VITAMIN D) 25 MCG (1000 UNIT) tablet Take 2,000 Units by mouth daily with breakfast.     Ensure (ENSURE) Take 1 Can by mouth as needed.     furosemide (LASIX) 40 MG tablet Take 1 tablet (40 mg total) by mouth daily. 30 tablet 1   levocetirizine (XYZAL) 5 MG tablet Take 5 mg by mouth daily as needed.     lovastatin (MEVACOR) 40 MG  tablet Take 1 tablet (40 mg total) by mouth at bedtime. 90 tablet 3   metoprolol succinate (TOPROL-XL) 50 MG 24 hr tablet Take 1 tablet (50 mg total) by mouth 2 (two) times daily. Take with or immediately following a meal. 60 tablet 1   Multiple Vitamins-Minerals (PRESERVISION AREDS 2 PO) Take 1 tablet by mouth in the morning and at bedtime.     nitroGLYCERIN (NITROSTAT) 0.4 MG SL tablet PLACE 1 TABLET (0.4 MG TOTAL) UNDER THE TONGUE EVERY 5 (FIVE) MINUTES AS NEEDED FOR CHEST PAIN. 100 tablet 3   Omega-3 Fatty Acids (FISH OIL) 1200 MG CAPS Take 2,400 mg by mouth daily.     omeprazole (PRILOSEC) 20 MG capsule Take 1 capsule (20 mg total) by mouth 2 (two) times daily before a meal. 180 capsule 1   potassium chloride SA (KLOR-CON M) 20 MEQ tablet Take 2 tablets (40 mEq total) by mouth daily. 60 tablet 1   Probiotic Product (PHILLIPS COLON HEALTH PO) Take 1 capsule by mouth daily as needed.     spironolactone (ALDACTONE) 25 MG tablet Take 1 tablet (25 mg total) by mouth daily. 30 tablet 1   warfarin (COUMADIN) 5 MG tablet TAKE 1 TABLET EVERY DAY 90 tablet 0   No current facility-administered medications for this visit.    Allergies:   Doxycycline, Contrast media [iodinated contrast media], Lunesta [eszopiclone], Morphine and related, and Trazodone and nefazodone    Social History:  The patient  reports that he quit smoking about 60 years ago. His smoking use included cigarettes. He started smoking about 71 years ago. He has never used smokeless tobacco. He reports that he does not currently use alcohol. He reports that he does not use drugs.   Family History:  The patient's family history includes Diabetes in his son; Heart disease in his mother; Kidney disease in his sister; Lung cancer in his brother; Mental illness in his sister; Other in his sister; Stroke in his father, mother, and son.  He indicated that his mother is deceased. He indicated that his father is deceased. He indicated that all of  his three sisters are deceased. He indicated that his brother is deceased. He indicated that his maternal grandmother is deceased. He indicated that his maternal grandfather is deceased. He indicated that his paternal grandmother is deceased. He indicated that his paternal grandfather is deceased. He indicated that his son is alive. He indicated that the status of his neg hx is unknown.   ROS:  Please see the history of present illness. All other systems are reviewed and negative.    PHYSICAL EXAM: VS:  BP 120/60    Pulse 75    Ht 6\' 2"  (1.88 m)    Wt 166 lb 3.2 oz (75.4 kg)    SpO2 98%    BMI 21.34 kg/m  ,  BMI Body mass index is 21.34 kg/m.  Affect appropriate Healthy:  appears stated age 67: normal Neck supple with no adenopathy JVP elevated bilateral  bruits no thyromegaly Lungs clear with no wheezing and good diaphragmatic motion Heart:  S1/S2 AS  murmur, no rub, gallop or click PMI normal Abdomen: benighn, BS positve, no tenderness, no AAA no bruit.  No HSM or HJR Distal pulses intact with no bruits No edema Neuro non-focal Skin warm and dry No muscular weakness    EKG:   Afib rate 85 poor R wave progression 11/05/19   Echo:  02/06/21  IMPRESSIONS     1. Left ventricular ejection fraction, by estimation, is 40-45%. The left  ventricle has mildly decreased function. The left ventricle demonstrates  regional wall motion abnormalities (abnormal septal motion). There is mild  concentric left ventricular  hypertrophy. Left ventricular diastolic parameters are indeterminate.  There is the interventricular septum is flattened in systole, consistent  with right ventricular pressure overload.   2. Right ventricular systolic function is mildly reduced. The right  ventricular size is enlarged. There is moderately elevated pulmonary  artery systolic pressure. The estimated right ventricular systolic  pressure is 40.9 mmHg.   3. Right atrial size was moderately dilated.   4.  The mitral valve is abnormal. No evidence of mitral valve  regurgitation. No evidence of mitral stenosis. The mean mitral valve  gradient is 3.0 mmHg with average heart rate of 119 bpm. Mitral valve area  by continuity equation 3.3 cm2.   5. Tricuspid valve regurgitation is severe.   6. The aortic valve is calcified. Aortic valve regurgitation is not  visualized. Moderate aortic valve stenosis. There is low flow low gradient  aortic stenosis, mean gradient 25 mm Hg but in the setting of decreased LV  stroke volume. DVI 0.39.   Comparison(s): Compared to 2021 study, LVEF and AS are worse; prior study  not done in the setting of this tachycardia.   Cath 02/11/20: Conclusion    Ost LAD to Prox LAD lesion is 100% stenosed. Ost LM to Mid LM lesion is 100% stenosed. Prox RCA to Mid RCA lesion is 25% stenosed. RPDA lesion is 35% stenosed. SVG graft was visualized by angiography and is normal in caliber. The graft exhibits no disease. RIMA graft was visualized by angiography. LIMA graft was visualized by angiography and is normal in caliber. The graft exhibits no disease. The left ventricular systolic function is normal. LV end diastolic pressure is normal. The left ventricular ejection fraction is 55-65% by visual estimate.   1. Left main ostial occlusion 2. Nonobstructive disease in the RCA 3. Patent LIMA to the LAD 4. Atretic RIMA to the ramus intermediate. The Ramus intermediate fills retrograde from the LCx graft 5. Patent SVG to the OM 6. The right subclavian bypass is widely patent 7. Normal right heart pressures 8. Normal LV filling pressures 9. Normal cardiac output.    Plan: continue medical management.     Recent Labs: 02/05/2021: TSH 0.838 02/10/2021: ALT 35 02/16/2021: BNP 374.4; BUN 19; Creatinine, Ser 1.12; Hemoglobin 13.7; Magnesium 1.9; Platelets 242; Potassium 5.6; Sodium 131  CBC    Component Value Date/Time   WBC 6.8 02/16/2021 1103   WBC 5.5 02/10/2021 0604    RBC 4.48 02/16/2021 1103   RBC 3.92 (L) 02/10/2021 0604   HGB 13.7 02/16/2021 1103   HCT 40.4 02/16/2021 1103   PLT 242 02/16/2021 1103   MCV 90 02/16/2021 1103   MCH 30.6 02/16/2021  1103   MCH 32.1 02/10/2021 0604   MCHC 33.9 02/16/2021 1103   MCHC 34.6 02/10/2021 0604   RDW 13.8 02/16/2021 1103   LYMPHSABS 1.2 02/16/2021 1103   MONOABS 1.8 (H) 09/19/2012 1915   EOSABS 0.1 02/16/2021 1103   BASOSABS 0.1 02/16/2021 1103   CMP Latest Ref Rng & Units 02/16/2021 02/10/2021 02/09/2021  Glucose 70 - 99 mg/dL 86 99 103(H)  BUN 8 - 27 mg/dL 19 17 18   Creatinine 0.76 - 1.27 mg/dL 1.12 0.90 0.78  Sodium 134 - 144 mmol/L 131(L) 131(L) 128(L)  Potassium 3.5 - 5.2 mmol/L 5.6(H) 3.1(L) 3.9  Chloride 96 - 106 mmol/L 93(L) 91(L) 94(L)  CO2 20 - 29 mmol/L 25 28 23   Calcium 8.6 - 10.2 mg/dL 9.1 8.5(L) 8.3(L)  Total Protein 6.5 - 8.1 g/dL - 5.9(L) 5.9(L)  Total Bilirubin 0.3 - 1.2 mg/dL - 1.7(H) 2.1(H)  Alkaline Phos 38 - 126 U/L - 151(H) 142(H)  AST 15 - 41 U/L - 43(H) 40  ALT 0 - 44 U/L - 35 28   Lab Results  Component Value Date   INR 1.6 (H) 02/23/2021   INR 1.1 (A) 02/16/2021   INR 1.1 02/16/2021    Lipid Panel Lab Results  Component Value Date   CHOL 150 10/28/2020   HDL 88 10/28/2020   LDLCALC 50 10/28/2020   TRIG 58 10/28/2020   CHOLHDL 1.7 10/28/2020      Wt Readings from Last 3 Encounters:  03/04/21 166 lb 3.2 oz (75.4 kg)  02/23/21 165 lb 9.6 oz (75.1 kg)  02/16/21 171 lb (77.6 kg)     Other studies Reviewed: Additional studies/ records that were reviewed today include: Office notes, hospital records and testing.  ASSESSMENT AND PLAN:  1.  DOE:  - EF  40-45% cath with normal right /left heart pressures 02/10/21 despite echo morphology Cors /grafts stable medical Rx   2.  Persistent atrial fibrillation -chronic on anticoagulation rate control ok  INR supra therapeutic during hospitalization now low   3.  Hypertension -continue losartan and beta blocker as  well as lasix   4.  Carotid disease, history of subclavian disease -Duplex 11/07/19 with patent right CCA to subclavian bypass and plaque in L ICA graft patent at cath 02/11/20 as well  5. CAD/CABG:  stable anatomy on cath with  atretic RIMA to Ramus but fills from LCX system Medial Rx   6. AS:  at this point moderate but no plans for TAVR evaluation  7. Delirium / Dementia:  some improvement but significant check BMET/MG today f/u primary to discuss Namenda or Aricept   Patient is 38 with FTT and DNR I confirmed this with his niece today and also fact that family wants him to be comfortable and we would not pursue TAVR at any point    Disposition:   FU  in a year   Signed, [name], MD  03/04/2021 2:00 PM    [place] Phone: [phone]; Fax: [phone]

## 2021-03-02 ENCOUNTER — Ambulatory Visit (INDEPENDENT_AMBULATORY_CARE_PROVIDER_SITE_OTHER): Payer: Medicare HMO

## 2021-03-02 ENCOUNTER — Other Ambulatory Visit: Payer: Self-pay

## 2021-03-02 DIAGNOSIS — E871 Hypo-osmolality and hyponatremia: Secondary | ICD-10-CM | POA: Diagnosis not present

## 2021-03-02 DIAGNOSIS — I4819 Other persistent atrial fibrillation: Secondary | ICD-10-CM

## 2021-03-02 DIAGNOSIS — I5021 Acute systolic (congestive) heart failure: Secondary | ICD-10-CM

## 2021-03-02 DIAGNOSIS — K219 Gastro-esophageal reflux disease without esophagitis: Secondary | ICD-10-CM

## 2021-03-02 DIAGNOSIS — E785 Hyperlipidemia, unspecified: Secondary | ICD-10-CM | POA: Diagnosis not present

## 2021-03-02 DIAGNOSIS — Z951 Presence of aortocoronary bypass graft: Secondary | ICD-10-CM

## 2021-03-02 DIAGNOSIS — G9341 Metabolic encephalopathy: Secondary | ICD-10-CM

## 2021-03-02 DIAGNOSIS — I110 Hypertensive heart disease with heart failure: Secondary | ICD-10-CM | POA: Diagnosis not present

## 2021-03-02 DIAGNOSIS — I700 Atherosclerosis of aorta: Secondary | ICD-10-CM | POA: Diagnosis not present

## 2021-03-02 DIAGNOSIS — Z7901 Long term (current) use of anticoagulants: Secondary | ICD-10-CM

## 2021-03-02 DIAGNOSIS — D6869 Other thrombophilia: Secondary | ICD-10-CM

## 2021-03-02 DIAGNOSIS — I2510 Atherosclerotic heart disease of native coronary artery without angina pectoris: Secondary | ICD-10-CM

## 2021-03-02 DIAGNOSIS — I6529 Occlusion and stenosis of unspecified carotid artery: Secondary | ICD-10-CM | POA: Diagnosis not present

## 2021-03-02 DIAGNOSIS — K5730 Diverticulosis of large intestine without perforation or abscess without bleeding: Secondary | ICD-10-CM

## 2021-03-04 ENCOUNTER — Ambulatory Visit: Payer: Medicare HMO | Admitting: Cardiovascular Disease

## 2021-03-04 ENCOUNTER — Other Ambulatory Visit: Payer: Self-pay

## 2021-03-04 ENCOUNTER — Encounter: Payer: Self-pay | Admitting: Cardiovascular Disease

## 2021-03-04 ENCOUNTER — Other Ambulatory Visit (HOSPITAL_COMMUNITY)
Admission: RE | Admit: 2021-03-04 | Discharge: 2021-03-04 | Disposition: A | Discharge status: Home / Self Care | Payer: Medicare HMO | Source: Ambulatory Visit | Attending: Cardiovascular Disease | Admitting: Cardiovascular Disease

## 2021-03-04 VITALS — BP 120/60 | HR 75 | Ht 74.0 in | Wt 166.2 lb

## 2021-03-04 DIAGNOSIS — I4811 Longstanding persistent atrial fibrillation: Secondary | ICD-10-CM

## 2021-03-04 DIAGNOSIS — I509 Heart failure, unspecified: Secondary | ICD-10-CM | POA: Diagnosis not present

## 2021-03-04 DIAGNOSIS — Z951 Presence of aortocoronary bypass graft: Secondary | ICD-10-CM | POA: Diagnosis not present

## 2021-03-04 DIAGNOSIS — F0152 Vascular dementia, unspecified severity, with psychotic disturbance: Secondary | ICD-10-CM

## 2021-03-04 DIAGNOSIS — Z79899 Other long term (current) drug therapy: Secondary | ICD-10-CM | POA: Insufficient documentation

## 2021-03-04 LAB — BASIC METABOLIC PANEL
Anion gap: 10 (ref 5–15)
BUN: 29 mg/dL — ABNORMAL HIGH (ref 8–23)
CO2: 26 mmol/L (ref 22–32)
Calcium: 9.3 mg/dL (ref 8.9–10.3)
Chloride: 89 mmol/L — ABNORMAL LOW (ref 98–111)
Creatinine, Ser: 1.38 mg/dL — ABNORMAL HIGH (ref 0.61–1.24)
GFR, Estimated: 49 mL/min — ABNORMAL LOW (ref >60)
Glucose, Bld: 98 mg/dL (ref 70–99)
Potassium: 4.9 mmol/L (ref 3.5–5.1)
Sodium: 125 mmol/L — ABNORMAL LOW (ref 135–145)

## 2021-03-04 LAB — MAGNESIUM: Magnesium: 2 mg/dL (ref 1.7–2.4)

## 2021-03-04 NOTE — Patient Instructions (Addendum)
Follow-Up: Follow up with Dr Johnsie Cancel in 6 months  If you need a refill on your cardiac medications before your next appointment, please call your pharmacy.

## 2021-03-05 ENCOUNTER — Ambulatory Visit (INDEPENDENT_AMBULATORY_CARE_PROVIDER_SITE_OTHER): Payer: Medicare HMO | Admitting: Family Medicine

## 2021-03-05 ENCOUNTER — Telehealth: Payer: Self-pay

## 2021-03-05 ENCOUNTER — Encounter: Payer: Self-pay | Admitting: Family Medicine

## 2021-03-05 VITALS — BP 121/69 | HR 82 | Temp 97.1°F | Ht 74.0 in | Wt 164.0 lb

## 2021-03-05 DIAGNOSIS — I480 Paroxysmal atrial fibrillation: Secondary | ICD-10-CM

## 2021-03-05 DIAGNOSIS — Z7901 Long term (current) use of anticoagulants: Secondary | ICD-10-CM

## 2021-03-05 LAB — COAGUCHEK XS/INR WAIVED
INR: 5.6 (ref 0.9–1.1)
Prothrombin Time: 66.7 s

## 2021-03-05 NOTE — Progress Notes (Signed)
Assessment & Plan:  1-2. Paroxysmal atrial fibrillation (HCC)/Long term (current) use of anticoagulants Description   INR 5.6 today Goal INR 2-3 Hold coumadin today, then continue 5 mg daily.  Re-check INR in 7 days   - CoaguChek XS/INR Waived   Return in about 6 days (around 03/11/2021) for INR.  [name], MSN, APRN, FNP-C [facility]  Subjective:    Patient ID: Danny Bomkamp., male    DOB: November 25, 1931, 88 y.o.   MRN: [id number]  Patient Care Team: Loman Brooklyn, FNP as PCP - General (Family Medicine) Josue Hector, MD as PCP - Cardiology (Cardiology) Josue Hector, MD as Attending Physician (Cardiology) [name], MD (Inactive) as Consulting Physician (Gastroenterology) Okey Regal, OD (Optometry) Rexene Alberts, MD (Inactive) as Consulting Physician (Cardiothoracic Surgery) Allyn Kenner, MD as Consulting Physician (Dermatology)   Chief Complaint:  Chief Complaint  Patient presents with   Coagulation Disorder    HPI: Danny Whalin. is a 88 y.o. male presenting on 03/05/2021 for Coagulation Disorder  Anticoagulation: Patient here for anticoagulation monitoring. Danny [name] is accompanied by his niece who has been helping him.  Indication: atrial fibrillation Bleeding Signs/Symptoms: None Thromboembolic Signs/Symptoms: None  Missed Coumadin Doses: None Medication Changes: coumadin dose increased by 2.5 mg one day per week at our last visit 10 days ago Dietary Changes:  No Bacterial/Viral Infection:  No  New complaints: Patient's niece states she is going to send me medications on MyChart to review as the recommendation of patient's cardiologist.    Social history:  Relevant past medical, surgical, family and social history reviewed and updated as indicated. Interim medical history since our last visit reviewed.  Allergies and medications reviewed and updated.  DATA REVIEWED: CHART IN EPIC  ROS: Negative unless  specifically indicated above in HPI.    Current Outpatient Medications:    cholecalciferol (VITAMIN D) 25 MCG (1000 UNIT) tablet, Take 2,000 Units by mouth daily with breakfast., Disp: , Rfl:    Ensure (ENSURE), Take 1 Can by mouth as needed., Disp: , Rfl:    furosemide (LASIX) 40 MG tablet, Take 1 tablet (40 mg total) by mouth daily., Disp: 30 tablet, Rfl: 1   levocetirizine (XYZAL) 5 MG tablet, Take 5 mg by mouth daily as needed., Disp: , Rfl:    lovastatin (MEVACOR) 40 MG tablet, Take 1 tablet (40 mg total) by mouth at bedtime., Disp: 90 tablet, Rfl: 3   metoprolol succinate (TOPROL-XL) 50 MG 24 hr tablet, Take 1 tablet (50 mg total) by mouth 2 (two) times daily. Take with or immediately following a meal., Disp: 60 tablet, Rfl: 1   Multiple Vitamins-Minerals (PRESERVISION AREDS 2 PO), Take 1 tablet by mouth in the morning and at bedtime., Disp: , Rfl:    nitroGLYCERIN (NITROSTAT) 0.4 MG SL tablet, PLACE 1 TABLET (0.4 MG TOTAL) UNDER THE TONGUE EVERY 5 (FIVE) MINUTES AS NEEDED FOR CHEST PAIN., Disp: 100 tablet, Rfl: 3   Omega-3 Fatty Acids (FISH OIL) 1200 MG CAPS, Take 2,400 mg by mouth daily., Disp: , Rfl:    omeprazole (PRILOSEC) 20 MG capsule, Take 1 capsule (20 mg total) by mouth 2 (two) times daily before a meal., Disp: 180 capsule, Rfl: 1   potassium chloride SA (KLOR-CON M) 20 MEQ tablet, Take 2 tablets (40 mEq total) by mouth daily., Disp: 60 tablet, Rfl: 1   Probiotic Product ([place]), Take 1 capsule by mouth daily as needed., Disp: , Rfl:  spironolactone (ALDACTONE) 25 MG tablet, Take 1 tablet (25 mg total) by mouth daily., Disp: 30 tablet, Rfl: 1   warfarin (COUMADIN) 5 MG tablet, TAKE 1 TABLET EVERY DAY, Disp: 90 tablet, Rfl: 0   Allergies  Allergen Reactions   Doxycycline Diarrhea   Contrast Media [Iodinated Contrast Media]     Syncope   Lunesta [Eszopiclone]     "felt WILD"   Morphine And Related Nausea And Vomiting   Trazodone And Nefazodone Other (See  Comments)    Extremely drowsy the next day after taking.   Past Medical History:  Diagnosis Date   Anxiety    Atrial fibrillation ([place])    CAD, ARTERY BYPASS GRAFT 05/19/2008   Qualifier: Diagnosis of  By: Mare Ferrari, RMA, Sherri     Enteritis due to Clostridium difficile, history of 11/21/2012   GERD (gastroesophageal reflux disease)    Hyperlipidemia    Hypertension    Internal carotid artery stenosis    Shingles 06/2020   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 01/14/2014   Vitamin D deficiency     Past Surgical History:  Procedure Laterality Date   CARDIOVERSION N/A 05/05/2016   Procedure: CARDIOVERSION;  Surgeon: Josue Hector, MD;  Location: [place];  Service: Cardiovascular;  Laterality: N/A;   CAROTID ENDARTERECTOMY Left 2003   CAROTID-SUBCLAVIAN BYPASS GRAFT Right    COLONOSCOPY  2005   hyperplastic polyp x 1 with no adenoma   COLONOSCOPY N/A 04/11/2014   NLG:XQJJ diverticulosis in the sigmoid colon/left colon is redundant   CORONARY ARTERY BYPASS GRAFT     ESOPHAGEAL DILATION N/A 04/11/2014   Procedure: ESOPHAGEAL DILATION;  Surgeon: [name], MD;  [facility];  Service: Endoscopy;  Laterality: N/A;   ESOPHAGOGASTRODUODENOSCOPY N/A 04/11/2014   HER:[id number] at the gastro junction/moderate erosive/duodenal web   EYE SURGERY Bilateral    cataracts   RIGHT/LEFT HEART CATH AND CORONARY/GRAFT ANGIOGRAPHY N/A 02/11/2020   Procedure: RIGHT/LEFT HEART CATH AND CORONARY/GRAFT ANGIOGRAPHY;  Surgeon: Martinique, Peter M, MD;  Location: [place] [facility];  Service: Cardiovascular;  Laterality: N/A;   SKIN CANCER EXCISION      Social History   Socioeconomic History   Marital status: Married    Spouse name: ruby   Number of children: 1   Years of education: Not on file   Highest education level: Not on file  Occupational History   Occupation: retired    Fish farm manager: [employer]  Tobacco Use   Smoking status: Former    Types: Cigarettes    Start date:  01/24/1950    Quit date: 01/24/1961    Years since quitting: 60.1   Smokeless tobacco: Never  Vaping Use   Vaping Use: Never used  Substance and Sexual Activity   Alcohol use: Not Currently    Alcohol/week: 0.0 standard drinks    Comment: not drank since 12/2009   Drug use: No   Sexual activity: Yes    Partners: Female  Other Topics Concern   Not on file  Social History Narrative   Not on file   Social Determinants of Health   Financial Resource Strain: Not on file  Food Insecurity: Not on file  Transportation Needs: Not on file  Physical Activity: Not on file  Stress: Not on file  Social Connections: Not on file  Intimate Partner Violence: Not on file        Objective:    Ht 6' 2"  (1.88 m)    BMI 21.34 kg/m  Wt Readings from Last 3 Encounters:  03/04/21 166 lb 3.2 oz (75.4 kg)  02/23/21 165 lb 9.6 oz (75.1 kg)  02/16/21 171 lb (77.6 kg)    Physical Exam Vitals reviewed.  Constitutional:      General: Danny [name] is not in acute distress.    Appearance: Normal appearance. Danny [name] is normal weight. Danny [name] is not ill-appearing, toxic-appearing or diaphoretic.  HENT:     Head: Normocephalic and atraumatic.  Eyes:     General: No scleral icterus.       Right eye: No discharge.        Left eye: No discharge.     Conjunctiva/sclera: Conjunctivae normal.  Cardiovascular:     Rate and Rhythm: Normal rate.  Pulmonary:     Effort: Pulmonary effort is normal. No respiratory distress.  Musculoskeletal:        General: Normal range of motion.     Cervical back: Normal range of motion.  Skin:    General: Skin is warm and dry.  Neurological:     Mental Status: Danny [name] is alert and oriented to person, place, and time. Mental status is at baseline.  Psychiatric:        Mood and Affect: Mood normal.        Behavior: Behavior normal.        Thought Content: Thought content normal.        Judgment: Judgment normal.    Lab Results  Component Value Date   TSH 0.838 02/05/2021   Lab Results   Component Value Date   WBC 6.8 02/16/2021   HGB 13.7 02/16/2021   HCT 40.4 02/16/2021   MCV 90 02/16/2021   PLT 242 02/16/2021   Lab Results  Component Value Date   NA 125 (L) 03/04/2021   K 4.9 03/04/2021   CO2 26 03/04/2021   GLUCOSE 98 03/04/2021   BUN 29 (H) 03/04/2021   CREATININE 1.38 (H) 03/04/2021   BILITOT 1.7 (H) 02/10/2021   ALKPHOS 151 (H) 02/10/2021   AST 43 (H) 02/10/2021   ALT 35 02/10/2021   PROT 5.9 (L) 02/10/2021   ALBUMIN 3.1 (L) 02/10/2021   CALCIUM 9.3 03/04/2021   ANIONGAP 10 03/04/2021   EGFR 64 02/16/2021   Lab Results  Component Value Date   CHOL 150 10/28/2020   Lab Results  Component Value Date   HDL 88 10/28/2020   Lab Results  Component Value Date   LDLCALC 50 10/28/2020   Lab Results  Component Value Date   TRIG 58 10/28/2020   Lab Results  Component Value Date   CHOLHDL 1.7 10/28/2020   No results found for: HGBA1C

## 2021-03-05 NOTE — Telephone Encounter (Signed)
I spoke with niece, [name]. She said they saw PCP today for INR check only (5.6). It will be repeated next week.    I discussed lab results with her and I will forward to PCP.

## 2021-03-05 NOTE — Telephone Encounter (Signed)
-----   Message from Danny Hector, MD sent at 03/04/2021  3:28 PM EST ----- Sodium very low and Cr suggests he is dry Needs to f/u with primary tomorrow And likely get more labs like urine osmolality, urine sodium and serum osmolality Take him to hospital if he starts acting funny/delerium again

## 2021-03-10 ENCOUNTER — Encounter: Payer: Self-pay | Admitting: Family Medicine

## 2021-03-11 ENCOUNTER — Encounter: Payer: Self-pay | Admitting: Family Medicine

## 2021-03-11 ENCOUNTER — Ambulatory Visit (INDEPENDENT_AMBULATORY_CARE_PROVIDER_SITE_OTHER): Payer: Medicare HMO | Admitting: Family Medicine

## 2021-03-11 VITALS — BP 83/59 | HR 86 | Temp 97.1°F | Ht 74.0 in | Wt 163.6 lb

## 2021-03-11 DIAGNOSIS — E871 Hypo-osmolality and hyponatremia: Secondary | ICD-10-CM

## 2021-03-11 DIAGNOSIS — Z23 Encounter for immunization: Secondary | ICD-10-CM

## 2021-03-11 DIAGNOSIS — E861 Hypovolemia: Secondary | ICD-10-CM | POA: Diagnosis not present

## 2021-03-11 DIAGNOSIS — I9589 Other hypotension: Secondary | ICD-10-CM | POA: Diagnosis not present

## 2021-03-11 DIAGNOSIS — Z7901 Long term (current) use of anticoagulants: Secondary | ICD-10-CM | POA: Diagnosis not present

## 2021-03-11 DIAGNOSIS — I480 Paroxysmal atrial fibrillation: Secondary | ICD-10-CM

## 2021-03-11 LAB — CMP14+EGFR
ALT: 28 IU/L (ref 0–44)
AST: 34 IU/L (ref 0–40)
Albumin/Globulin Ratio: 1.7 (ref 1.2–2.2)
Albumin: 4.3 g/dL (ref 3.6–4.6)
Alkaline Phosphatase: 164 IU/L — ABNORMAL HIGH (ref 44–121)
BUN/Creatinine Ratio: 21 (ref 10–24)
BUN: 26 mg/dL (ref 8–27)
Bilirubin Total: 0.8 mg/dL (ref 0.0–1.2)
CO2: 24 mmol/L (ref 20–29)
Calcium: 9.8 mg/dL (ref 8.6–10.2)
Chloride: 87 mmol/L — ABNORMAL LOW (ref 96–106)
Creatinine, Ser: 1.21 mg/dL (ref 0.76–1.27)
Globulin, Total: 2.5 g/dL (ref 1.5–4.5)
Glucose: 106 mg/dL — ABNORMAL HIGH (ref 70–99)
Potassium: 4.8 mmol/L (ref 3.5–5.2)
Sodium: 127 mmol/L — ABNORMAL LOW (ref 134–144)
Total Protein: 6.8 g/dL (ref 6.0–8.5)
eGFR: 58 mL/min/{1.73_m2} — ABNORMAL LOW (ref >59)

## 2021-03-11 LAB — COAGUCHEK XS/INR WAIVED
INR: 6.9 (ref 0.9–1.1)
Prothrombin Time: 83.1 s

## 2021-03-11 LAB — TSH: TSH: 1.62 u[IU]/mL (ref 0.450–4.500)

## 2021-03-11 MED ORDER — PHYTONADIONE 5 MG PO TABS
10.0000 mg | ORAL_TABLET | Freq: Once | ORAL | Status: AC
  Administered 2021-03-11: 10 mg via ORAL

## 2021-03-11 NOTE — Progress Notes (Signed)
Assessment & Plan:  1-2. Paroxysmal atrial fibrillation (HCC)/Long term (current) use of anticoagulants Description   INR 7.1 today Goal INR 2-3 Hold coumadin today and tomorrow, then resume 5 mg daily with 2.5 mg on Saturdays Re-check INR in 7 days   - CoaguChek XS/INR Waived - CMP14+EGFR - TSH - phytonadione (VITAMIN K) tablet 10 mg given in office today  3-4. Hyponatremia/Hypotension due to hypovolemia Sodium noted to be 125 with cardiology lab work last week. Recommended patient go to the ER due to hyponatremia and hypotension with weakness but he declined. Discussed if he is not going to go he needs to increase intake of salt and drink more fluids. Suggested Gatorade every 15-20 minutes to rehydrate himself. Lab work ordered STAT. - CMP14+EGFR  5. Need for shingles vaccine - Varicella-zoster vaccine IM (Shingrix)   Return in 6 days (on 03/17/2021) for INR.  [name], MSN, APRN, FNP-C [facility]  Subjective:    Patient ID: Danny Rugg., male    DOB: Apr 01, 1931, 88 y.o.   MRN: [id number]  Patient Care Team: Loman Brooklyn, FNP as PCP - General (Family Medicine) Josue Hector, MD as PCP - Cardiology (Cardiology) Josue Hector, MD as Attending Physician (Cardiology) [name], MD (Inactive) as Consulting Physician (Gastroenterology) Okey Regal, OD (Optometry) Rexene Alberts, MD (Inactive) as Consulting Physician (Cardiothoracic Surgery) Allyn Kenner, MD as Consulting Physician (Dermatology)   Chief Complaint:  Chief Complaint  Patient presents with   Atrial Fibrillation    HPI: Danny Pollack. is a 88 y.o. male presenting on 03/11/2021 for Atrial Fibrillation  Anticoagulation: Patient here for anticoagulation monitoring. He is accompanied by his niece who has been helping him.  Indication: atrial fibrillation Bleeding Signs/Symptoms: None Thromboembolic Signs/Symptoms: None  Missed Coumadin Doses:  None Medication Changes: coumadin dose held 6 days ago and then he resumed previous dosing of 5 mg daily. His amiodarone was also discontinued last week by cardiology. Dietary Changes:  No Bacterial/Viral Infection:  No  New complaints: None   Social history:  Relevant past medical, surgical, family and social history reviewed and updated as indicated. Interim medical history since our last visit reviewed.  Allergies and medications reviewed and updated.  DATA REVIEWED: CHART IN EPIC  ROS: Negative unless specifically indicated above in HPI.    Current Outpatient Medications:    cholecalciferol (VITAMIN D) 25 MCG (1000 UNIT) tablet, Take 2,000 Units by mouth daily with breakfast., Disp: , Rfl:    Ensure (ENSURE), Take 1 Can by mouth as needed., Disp: , Rfl:    furosemide (LASIX) 40 MG tablet, Take 1 tablet (40 mg total) by mouth daily., Disp: 30 tablet, Rfl: 1   levocetirizine (XYZAL) 5 MG tablet, Take 5 mg by mouth daily as needed., Disp: , Rfl:    lovastatin (MEVACOR) 40 MG tablet, Take 1 tablet (40 mg total) by mouth at bedtime., Disp: 90 tablet, Rfl: 3   metoprolol succinate (TOPROL-XL) 50 MG 24 hr tablet, Take 1 tablet (50 mg total) by mouth 2 (two) times daily. Take with or immediately following a meal., Disp: 60 tablet, Rfl: 1   Multiple Vitamins-Minerals (PRESERVISION AREDS 2 PO), Take 1 tablet by mouth in the morning and at bedtime., Disp: , Rfl:    nitroGLYCERIN (NITROSTAT) 0.4 MG SL tablet, PLACE 1 TABLET (0.4 MG TOTAL) UNDER THE TONGUE EVERY 5 (FIVE) MINUTES AS NEEDED FOR CHEST PAIN., Disp: 100 tablet, Rfl: 3   Omega-3 Fatty  Acids (FISH OIL) 1200 MG CAPS, Take 2,400 mg by mouth daily., Disp: , Rfl:    omeprazole (PRILOSEC) 20 MG capsule, Take 1 capsule (20 mg total) by mouth 2 (two) times daily before a meal., Disp: 180 capsule, Rfl: 1   potassium chloride SA (KLOR-CON M) 20 MEQ tablet, Take 2 tablets (40 mEq total) by mouth daily., Disp: 60 tablet, Rfl: 1   Probiotic  Product ([place]), Take 1 capsule by mouth daily as needed., Disp: , Rfl:    spironolactone (ALDACTONE) 25 MG tablet, Take 1 tablet (25 mg total) by mouth daily., Disp: 30 tablet, Rfl: 1   warfarin (COUMADIN) 5 MG tablet, TAKE 1 TABLET EVERY DAY, Disp: 90 tablet, Rfl: 0   Allergies  Allergen Reactions   Doxycycline Diarrhea   Contrast Media [Iodinated Contrast Media]     Syncope   Lunesta [Eszopiclone]     "felt WILD"   Morphine And Related Nausea And Vomiting   Trazodone And Nefazodone Other (See Comments)    Extremely drowsy the next day after taking.   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 01/14/2014   Vitamin D deficiency     Past Surgical History:  Procedure Laterality Date   CARDIOVERSION N/A 05/05/2016   Procedure: CARDIOVERSION;  Surgeon: Josue Hector, MD;  Location: [facility];  Service: Cardiovascular;  Laterality: N/A;   CAROTID ENDARTERECTOMY Left 2003   CAROTID-SUBCLAVIAN BYPASS GRAFT Right    COLONOSCOPY  2005   hyperplastic polyp x 1 with no adenoma   COLONOSCOPY N/A 04/11/2014   KZL:DJTT diverticulosis in the sigmoid colon/left colon is redundant   CORONARY ARTERY BYPASS GRAFT     ESOPHAGEAL DILATION N/A 04/11/2014   Procedure: ESOPHAGEAL DILATION;  Surgeon: [name], MD;  [facility];  Service: Endoscopy;  Laterality: N/A;   ESOPHAGOGASTRODUODENOSCOPY N/A 04/11/2014   [id number] at the gastro junction/moderate erosive/duodenal web   EYE SURGERY Bilateral    cataracts   RIGHT/LEFT HEART CATH AND CORONARY/GRAFT ANGIOGRAPHY N/A 02/11/2020    Procedure: RIGHT/LEFT HEART CATH AND CORONARY/GRAFT ANGIOGRAPHY;  Surgeon: Martinique, Peter M, MD;  Location: [facility];  Service: Cardiovascular;  Laterality: N/A;   SKIN CANCER EXCISION      Social History   Socioeconomic History   Marital status: Married    Spouse name: ruby   Number of children: 1   Years of education: Not on file   Highest education level: Not on file  Occupational History   Occupation: retired    Fish farm manager: [employer]  Tobacco Use   Smoking status: Former    Types: Cigarettes    Start date: 01/24/1950    Quit date: 01/24/1961    Years since quitting: 60.1   Smokeless tobacco: Never  Vaping Use   Vaping Use: Never used  Substance and Sexual Activity   Alcohol use: Not Currently    Alcohol/week: 0.0 standard drinks    Comment: not drank since 12/2009   Drug use: No   Sexual activity: Yes    Partners: Female  Other  Topics Concern   Not on file  Social History Narrative   Not on file   Social Determinants of Health   Financial Resource Strain: Not on file  Food Insecurity: Not on file  Transportation Needs: Not on file  Physical Activity: Not on file  Stress: Not on file  Social Connections: Not on file  Intimate Partner Violence: Not on file        Objective:    BP (!) 83/59    Pulse 86    Temp (!) 97.1 F (36.2 C)    Ht 6' 2" (1.88 m)    Wt 163 lb 9.6 oz (74.2 kg)    SpO2 97%    BMI 21.00 kg/m   Wt Readings from Last 3 Encounters:  03/11/21 163 lb 9.6 oz (74.2 kg)  03/05/21 164 lb (74.4 kg)  03/04/21 166 lb 3.2 oz (75.4 kg)    Physical Exam Vitals reviewed.  Constitutional:      General: He is not in acute distress.    Appearance: Normal appearance. He is normal weight. He is not ill-appearing, toxic-appearing or diaphoretic.  HENT:     Head: Normocephalic and atraumatic.  Eyes:     General: No scleral icterus.       Right eye: No discharge.        Left eye: No discharge.     Conjunctiva/sclera: Conjunctivae normal.   Cardiovascular:     Rate and Rhythm: Normal rate.  Pulmonary:     Effort: Pulmonary effort is normal. No respiratory distress.  Musculoskeletal:        General: Normal range of motion.     Cervical back: Normal range of motion.  Skin:    General: Skin is warm and dry.  Neurological:     Mental Status: He is alert and oriented to person, place, and time. Mental status is at baseline.  Psychiatric:        Mood and Affect: Mood normal.        Behavior: Behavior normal.        Thought Content: Thought content normal.        Judgment: Judgment normal.    Lab Results  Component Value Date   TSH 0.838 02/05/2021   Lab Results  Component Value Date   WBC 6.8 02/16/2021   HGB 13.7 02/16/2021   HCT 40.4 02/16/2021   MCV 90 02/16/2021   PLT 242 02/16/2021   Lab Results  Component Value Date   NA 125 (L) 03/04/2021   K 4.9 03/04/2021   CO2 26 03/04/2021   GLUCOSE 98 03/04/2021   BUN 29 (H) 03/04/2021   CREATININE 1.38 (H) 03/04/2021   BILITOT 1.7 (H) 02/10/2021   ALKPHOS 151 (H) 02/10/2021   AST 43 (H) 02/10/2021   ALT 35 02/10/2021   PROT 5.9 (L) 02/10/2021   ALBUMIN 3.1 (L) 02/10/2021   CALCIUM 9.3 03/04/2021   ANIONGAP 10 03/04/2021   EGFR 64 02/16/2021   Lab Results  Component Value Date   CHOL 150 10/28/2020   Lab Results  Component Value Date   HDL 88 10/28/2020   Lab Results  Component Value Date   LDLCALC 50 10/28/2020   Lab Results  Component Value Date   TRIG 58 10/28/2020   Lab Results  Component Value Date   CHOLHDL 1.7 10/28/2020   No results found for: HGBA1C

## 2021-03-15 ENCOUNTER — Other Ambulatory Visit: Payer: Self-pay | Admitting: Family Medicine

## 2021-03-15 DIAGNOSIS — E7800 Pure hypercholesterolemia, unspecified: Secondary | ICD-10-CM

## 2021-03-15 DIAGNOSIS — I700 Atherosclerosis of aorta: Secondary | ICD-10-CM

## 2021-03-17 ENCOUNTER — Ambulatory Visit (INDEPENDENT_AMBULATORY_CARE_PROVIDER_SITE_OTHER): Payer: Medicare HMO | Admitting: Family Medicine

## 2021-03-17 ENCOUNTER — Encounter: Payer: Self-pay | Admitting: Family Medicine

## 2021-03-17 VITALS — BP 111/61 | HR 90 | Temp 97.1°F | Ht 74.0 in | Wt 163.6 lb

## 2021-03-17 DIAGNOSIS — Z7901 Long term (current) use of anticoagulants: Secondary | ICD-10-CM

## 2021-03-17 DIAGNOSIS — I480 Paroxysmal atrial fibrillation: Secondary | ICD-10-CM | POA: Diagnosis not present

## 2021-03-17 DIAGNOSIS — E871 Hypo-osmolality and hyponatremia: Secondary | ICD-10-CM | POA: Diagnosis not present

## 2021-03-17 LAB — BMP8+EGFR
BUN/Creatinine Ratio: 21 (ref 10–24)
BUN: 22 mg/dL (ref 8–27)
CO2: 22 mmol/L (ref 20–29)
Calcium: 9.4 mg/dL (ref 8.6–10.2)
Chloride: 90 mmol/L — ABNORMAL LOW (ref 96–106)
Creatinine, Ser: 1.06 mg/dL (ref 0.76–1.27)
Glucose: 104 mg/dL — ABNORMAL HIGH (ref 70–99)
Potassium: 5.1 mmol/L (ref 3.5–5.2)
Sodium: 129 mmol/L — ABNORMAL LOW (ref 134–144)
eGFR: 68 mL/min/{1.73_m2} (ref >59)

## 2021-03-17 LAB — COAGUCHEK XS/INR WAIVED
INR: 2.5 — ABNORMAL HIGH (ref 0.9–1.1)
Prothrombin Time: 30.6 s

## 2021-03-17 NOTE — Progress Notes (Signed)
Assessment & Plan:  1-2. Paroxysmal atrial fibrillation (HCC)/Long term (current) use of anticoagulants Description   INR 2.5 today Goal INR 2-3 Continue coumadin 5 mg daily with 2.5 mg on Saturdays Re-check INR in 6 weeks   - CoaguChek XS/INR Waived - CMP14+EGFR - TSH - phytonadione (VITAMIN K) tablet 10 mg given in office today  3. Hyponatremia - BMP   Return in about 6 weeks (around 04/28/2021) for INR.  Danny Limes, MSN, APRN, FNP-C [facility]  Subjective:    Patient ID: Danny [name]., male    DOB: [birth year], 88 y.o.   MRN: [id number]  Patient Care Team: Loman Brooklyn, FNP as PCP - General (Family Medicine) Josue Hector, MD as PCP - Cardiology (Cardiology) Josue Hector, MD as Attending Physician (Cardiology) [name], MD (Inactive) as Consulting Physician (Gastroenterology) Okey Regal, OD (Optometry) Rexene Alberts, MD (Inactive) as Consulting Physician (Cardiothoracic Surgery) Allyn Kenner, MD as Consulting Physician (Dermatology)   Chief Complaint:  Chief Complaint  Patient presents with   Coagulation Disorder    HPI: Danny [name]. is a 88 y.o. male presenting on 03/17/2021 for Coagulation Disorder  Anticoagulation: Patient here for anticoagulation monitoring. He is accompanied by his niece who has been helping him.  Indication: atrial fibrillation Bleeding Signs/Symptoms: None Thromboembolic Signs/Symptoms: None  Missed Coumadin Doses: None Medication Changes: 6 days ago he was given vitamin K 10 mg due to INR of 7.1. he then held Coumadin x2 days and resumed 5 mg daily with a decrease to 2.5 mg on Saturdays. Dietary Changes:  No Bacterial/Viral Infection:  No  Hyponatremia: patient has increased his fluid intake and has been drinking lots of Gatorade and Boost when he doesn't eat a complete meal. He is eating lots of olives for their sodium content.    New complaints: None   Social  history:  Relevant past medical, surgical, family and social history reviewed and updated as indicated. Interim medical history since our last visit reviewed.  Allergies and medications reviewed and updated.  DATA REVIEWED: CHART IN EPIC  ROS: Negative unless specifically indicated above in HPI.    Current Outpatient Medications:    cholecalciferol (VITAMIN D) 25 MCG (1000 UNIT) tablet, Take 2,000 Units by mouth daily with breakfast., Disp: , Rfl:    Ensure (ENSURE), Take 1 Can by mouth as needed., Disp: , Rfl:    furosemide (LASIX) 40 MG tablet, Take 1 tablet (40 mg total) by mouth daily., Disp: 30 tablet, Rfl: 1   levocetirizine (XYZAL) 5 MG tablet, Take 5 mg by mouth daily as needed., Disp: , Rfl:    lovastatin (MEVACOR) 40 MG tablet, TAKE 1 TABLET AT BEDTIME, Disp: 90 tablet, Rfl: 1   metoprolol succinate (TOPROL-XL) 50 MG 24 hr tablet, Take 1 tablet (50 mg total) by mouth 2 (two) times daily. Take with or immediately following a meal., Disp: 60 tablet, Rfl: 1   Multiple Vitamins-Minerals (PRESERVISION AREDS 2 PO), Take 1 tablet by mouth in the morning and at bedtime., Disp: , Rfl:    nitroGLYCERIN (NITROSTAT) 0.4 MG SL tablet, PLACE 1 TABLET (0.4 MG TOTAL) UNDER THE TONGUE EVERY 5 (FIVE) MINUTES AS NEEDED FOR CHEST PAIN., Disp: 100 tablet, Rfl: 3   Omega-3 Fatty Acids (FISH OIL) 1200 MG CAPS, Take 2,400 mg by mouth daily., Disp: , Rfl:    omeprazole (PRILOSEC) 20 MG capsule, Take 1 capsule (20 mg total) by mouth 2 (two) times daily before  meal., Disp: 180 capsule, Rfl: 1 °  potassium chloride SA (KLOR-CON M) 20 MEQ tablet, Take 2 tablets (40 mEq total) by mouth daily., Disp: 60 tablet, Rfl: 1 °  Probiotic Product ([place] [facility] PO), Take 1 capsule by mouth daily as needed., Disp: , Rfl:  °  spironolactone (ALDACTONE) 25 MG tablet, Take 1 tablet (25 mg total) by mouth daily., Disp: 30 tablet, Rfl: 1 °  warfarin (COUMADIN) 5 MG tablet, TAKE 1 TABLET EVERY DAY, Disp: 90 tablet, Rfl:  0  ° °Allergies  °Allergen Reactions  ° Doxycycline Diarrhea  ° Contrast Media [Iodinated Contrast Media]   °  Syncope  ° Lunesta [Eszopiclone]   °  "felt WILD"  ° Morphine And Related Nausea And Vomiting  ° Trazodone And Nefazodone Other (See Comments)  °  Extremely drowsy the next day after taking.  ° °Past Medical History:  °Diagnosis Date  ° Anxiety   ° Atrial fibrillation (HCC)   ° CAD, ARTERY BYPASS GRAFT 05/19/2008  ° Qualifier: Diagnosis of  By: Frazier, RMA, Sherri    ° Enteritis due to Clostridium difficile, history of 11/21/2012  ° GERD (gastroesophageal reflux disease)   ° Hyperlipidemia   ° Hypertension   ° Internal carotid artery stenosis   ° Shingles 06/2020  ° 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 01/14/2014  ° Vitamin D deficiency   °  °Past Surgical History:  °Procedure Laterality Date  ° CARDIOVERSION N/A 05/05/2016  ° Procedure: CARDIOVERSION;  Surgeon: Peter C Nishan, MD;  Location: [facility];  Service: Cardiovascular;  Laterality: N/A;  ° CAROTID ENDARTERECTOMY Left 2003  ° CAROTID-SUBCLAVIAN BYPASS GRAFT Right   ° COLONOSCOPY  2005  ° hyperplastic polyp x 1 with no adenoma  ° COLONOSCOPY N/A 04/11/2014  ° SLF:mild diverticulosis in the sigmoid colon/left colon is redundant  ° CORONARY ARTERY BYPASS GRAFT    ° ESOPHAGEAL DILATION N/A 04/11/2014  ° Procedure: ESOPHAGEAL DILATION;  Surgeon: Sandi L Fields, MD;  [facility];  Service: Endoscopy;  Laterality: N/A;  ° ESOPHAGOGASTRODUODENOSCOPY N/A 04/11/2014  ° SLF:stricture at the gastro junction/moderate erosive/duodenal web  ° EYE SURGERY Bilateral   ° cataracts  ° RIGHT/LEFT HEART CATH AND CORONARY/GRAFT ANGIOGRAPHY N/A 02/11/2020  ° Procedure: RIGHT/LEFT HEART CATH AND CORONARY/GRAFT ANGIOGRAPHY;  Surgeon: Jordan, Peter M, MD;  [facility]: [facility];  Service: Cardiovascular;  Laterality: N/A;  ° SKIN CANCER EXCISION    °  °Social History  ° °Socioeconomic History  ° Marital status: Married  °  Spouse name: Danny [name]  °  Number of children: 1  ° Years of education: Not on file  ° Highest education level: Not on file  °Occupational History  ° Occupation: retired  °  Employer: [employer]  °Tobacco Use  ° Smoking status: Former  °  Types: Cigarettes  °  Start date: 01/24/1950  °  Quit date: 01/24/1961  °  Years since quitting: 60.1  ° Smokeless tobacco: Never  °Vaping Use  ° Vaping Use: Never used  °Substance and Sexual Activity  ° Alcohol use: Not Currently  °  Alcohol/week: 0.0 standard drinks  °  Comment: not drank since 12/2009  ° Drug use: No  ° Sexual activity: Yes  °  Partners: Female  °Other Topics Concern  ° Not on file  °Social History Narrative  ° Not on file  ° °Social Determinants of Health  ° °Financial Resource Strain: Not on file  °Food Insecurity: Not on file  °  Transportation Needs: Not on file  °Physical Activity: Not on file  °Stress: Not on file  °Social Connections: Not on file  °Intimate Partner Violence: Not on file  °  ° °   °Objective:  °  °BP 111/61    Pulse 90    Temp (!) 97.1 °F (36.2 °C) (Temporal)    Ht 6' 2" (1.88 m)    Wt 163 lb 9.6 oz (74.2 kg)    SpO2 99%    BMI 21.00 kg/m²  ° °Wt Readings from Last 3 Encounters:  °03/17/21 163 lb 9.6 oz (74.2 kg)  °03/11/21 163 lb 9.6 oz (74.2 kg)  °03/05/21 164 lb (74.4 kg)  ° ° °Physical Exam °Vitals reviewed.  °Constitutional:   °   General: He is not in acute distress. °   Appearance: Normal appearance. He is normal weight. He is not ill-appearing, toxic-appearing or diaphoretic.  °HENT:  °   Head: Normocephalic and atraumatic.  °Eyes:  °   General: No scleral icterus.    °   Right eye: No discharge.     °   Left eye: No discharge.  °   Conjunctiva/sclera: Conjunctivae normal.  °Cardiovascular:  °   Rate and Rhythm: Normal rate.  °Pulmonary:  °   Effort: Pulmonary effort is normal. No respiratory distress.  °Musculoskeletal:     °   General: Normal range of motion.  °   Cervical back: Normal range of motion.  °Skin: °   General: Skin is warm and dry.  °Neurological:  °    Mental Status: He is alert and oriented to person, place, and time. Mental status is at baseline.  °Psychiatric:     °   Mood and Affect: Mood normal.     °   Behavior: Behavior normal.     °   Thought Content: Thought content normal.     °   Judgment: Judgment normal.  ° ° °Lab Results  °Component Value Date  ° TSH 1.620 03/11/2021  ° °Lab Results  °Component Value Date  ° WBC 6.8 02/16/2021  ° HGB 13.7 02/16/2021  ° HCT 40.4 02/16/2021  ° MCV 90 02/16/2021  ° PLT 242 02/16/2021  ° °Lab Results  °Component Value Date  ° NA 127 (L) 03/11/2021  ° K 4.8 03/11/2021  ° CO2 24 03/11/2021  ° GLUCOSE 106 (H) 03/11/2021  ° BUN 26 03/11/2021  ° CREATININE 1.21 03/11/2021  ° BILITOT 0.8 03/11/2021  ° ALKPHOS 164 (H) 03/11/2021  ° AST 34 03/11/2021  ° ALT 28 03/11/2021  ° PROT 6.8 03/11/2021  ° ALBUMIN 4.3 03/11/2021  ° CALCIUM 9.8 03/11/2021  ° ANIONGAP 10 03/04/2021  ° EGFR 58 (L) 03/11/2021  ° °Lab Results  °Component Value Date  ° CHOL 150 10/28/2020  ° °Lab Results  °Component Value Date  ° HDL 88 10/28/2020  ° °Lab Results  °Component Value Date  ° LDLCALC 50 10/28/2020  ° °Lab Results  °Component Value Date  ° TRIG 58 10/28/2020  ° °Lab Results  °Component Value Date  ° CHOLHDL 1.7 10/28/2020  ° °No results found for: HGBA1C ° °   ° ° ° ° °

## 2021-03-31 ENCOUNTER — Other Ambulatory Visit: Payer: Self-pay | Admitting: Family Medicine

## 2021-03-31 DIAGNOSIS — I10 Essential (primary) hypertension: Secondary | ICD-10-CM

## 2021-03-31 DIAGNOSIS — K219 Gastro-esophageal reflux disease without esophagitis: Secondary | ICD-10-CM

## 2021-04-14 ENCOUNTER — Other Ambulatory Visit: Payer: Self-pay | Admitting: Family Medicine

## 2021-04-14 DIAGNOSIS — I10 Essential (primary) hypertension: Secondary | ICD-10-CM

## 2021-04-25 ENCOUNTER — Encounter: Payer: Self-pay | Admitting: Family Medicine

## 2021-04-27 ENCOUNTER — Encounter: Payer: Self-pay | Admitting: Family Medicine

## 2021-04-27 ENCOUNTER — Inpatient Hospital Stay (HOSPITAL_COMMUNITY)
Admission: EM | Admit: 2021-04-27 | Discharge: 2021-04-30 | DRG: 071 | Disposition: A | Discharge status: Skilled Nursing Facility | Payer: Medicare HMO | Source: Ambulatory Visit | Attending: Internal Medicine | Admitting: Internal Medicine

## 2021-04-27 ENCOUNTER — Ambulatory Visit (INDEPENDENT_AMBULATORY_CARE_PROVIDER_SITE_OTHER): Payer: Medicare HMO | Admitting: Family Medicine

## 2021-04-27 ENCOUNTER — Encounter (HOSPITAL_COMMUNITY): Payer: Self-pay

## 2021-04-27 ENCOUNTER — Emergency Department (HOSPITAL_COMMUNITY): Payer: Medicare HMO

## 2021-04-27 ENCOUNTER — Other Ambulatory Visit: Payer: Self-pay

## 2021-04-27 VITALS — BP 116/67 | HR 107 | Temp 97.0°F | Ht 74.0 in | Wt 165.6 lb

## 2021-04-27 DIAGNOSIS — E785 Hyperlipidemia, unspecified: Secondary | ICD-10-CM | POA: Diagnosis present

## 2021-04-27 DIAGNOSIS — Z818 Family history of other mental and behavioral disorders: Secondary | ICD-10-CM

## 2021-04-27 DIAGNOSIS — Z8249 Family history of ischemic heart disease and other diseases of the circulatory system: Secondary | ICD-10-CM

## 2021-04-27 DIAGNOSIS — I4819 Other persistent atrial fibrillation: Secondary | ICD-10-CM | POA: Diagnosis not present

## 2021-04-27 DIAGNOSIS — Z85828 Personal history of other malignant neoplasm of skin: Secondary | ICD-10-CM | POA: Diagnosis not present

## 2021-04-27 DIAGNOSIS — I272 Pulmonary hypertension, unspecified: Secondary | ICD-10-CM | POA: Diagnosis present

## 2021-04-27 DIAGNOSIS — D688 Other specified coagulation defects: Secondary | ICD-10-CM | POA: Diagnosis not present

## 2021-04-27 DIAGNOSIS — Z951 Presence of aortocoronary bypass graft: Secondary | ICD-10-CM

## 2021-04-27 DIAGNOSIS — Z9181 History of falling: Secondary | ICD-10-CM

## 2021-04-27 DIAGNOSIS — R2681 Unsteadiness on feet: Secondary | ICD-10-CM | POA: Diagnosis not present

## 2021-04-27 DIAGNOSIS — Z66 Do not resuscitate: Secondary | ICD-10-CM | POA: Diagnosis present

## 2021-04-27 DIAGNOSIS — K219 Gastro-esophageal reflux disease without esophagitis: Secondary | ICD-10-CM | POA: Diagnosis not present

## 2021-04-27 DIAGNOSIS — R531 Weakness: Secondary | ICD-10-CM | POA: Diagnosis not present

## 2021-04-27 DIAGNOSIS — R410 Disorientation, unspecified: Secondary | ICD-10-CM

## 2021-04-27 DIAGNOSIS — M25552 Pain in left hip: Secondary | ICD-10-CM | POA: Diagnosis present

## 2021-04-27 DIAGNOSIS — Z885 Allergy status to narcotic agent status: Secondary | ICD-10-CM | POA: Diagnosis not present

## 2021-04-27 DIAGNOSIS — G9341 Metabolic encephalopathy: Principal | ICD-10-CM | POA: Diagnosis present

## 2021-04-27 DIAGNOSIS — Z801 Family history of malignant neoplasm of trachea, bronchus and lung: Secondary | ICD-10-CM

## 2021-04-27 DIAGNOSIS — F419 Anxiety disorder, unspecified: Secondary | ICD-10-CM | POA: Diagnosis present

## 2021-04-27 DIAGNOSIS — I2510 Atherosclerotic heart disease of native coronary artery without angina pectoris: Secondary | ICD-10-CM | POA: Diagnosis present

## 2021-04-27 DIAGNOSIS — Z79899 Other long term (current) drug therapy: Secondary | ICD-10-CM

## 2021-04-27 DIAGNOSIS — E861 Hypovolemia: Secondary | ICD-10-CM | POA: Diagnosis present

## 2021-04-27 DIAGNOSIS — Z841 Family history of disorders of kidney and ureter: Secondary | ICD-10-CM

## 2021-04-27 DIAGNOSIS — I10 Essential (primary) hypertension: Secondary | ICD-10-CM | POA: Diagnosis present

## 2021-04-27 DIAGNOSIS — Z823 Family history of stroke: Secondary | ICD-10-CM

## 2021-04-27 DIAGNOSIS — Z833 Family history of diabetes mellitus: Secondary | ICD-10-CM | POA: Diagnosis not present

## 2021-04-27 DIAGNOSIS — Z91041 Radiographic dye allergy status: Secondary | ICD-10-CM

## 2021-04-27 DIAGNOSIS — R791 Abnormal coagulation profile: Secondary | ICD-10-CM | POA: Diagnosis not present

## 2021-04-27 DIAGNOSIS — E871 Hypo-osmolality and hyponatremia: Secondary | ICD-10-CM | POA: Diagnosis present

## 2021-04-27 DIAGNOSIS — R4182 Altered mental status, unspecified: Secondary | ICD-10-CM | POA: Diagnosis not present

## 2021-04-27 DIAGNOSIS — Z888 Allergy status to other drugs, medicaments and biological substances status: Secondary | ICD-10-CM

## 2021-04-27 DIAGNOSIS — Z7901 Long term (current) use of anticoagulants: Secondary | ICD-10-CM

## 2021-04-27 DIAGNOSIS — I6529 Occlusion and stenosis of unspecified carotid artery: Secondary | ICD-10-CM | POA: Diagnosis present

## 2021-04-27 DIAGNOSIS — F0390 Unspecified dementia without behavioral disturbance: Secondary | ICD-10-CM | POA: Diagnosis present

## 2021-04-27 DIAGNOSIS — R296 Repeated falls: Secondary | ICD-10-CM | POA: Diagnosis present

## 2021-04-27 DIAGNOSIS — I480 Paroxysmal atrial fibrillation: Secondary | ICD-10-CM

## 2021-04-27 DIAGNOSIS — R41841 Cognitive communication deficit: Secondary | ICD-10-CM | POA: Diagnosis not present

## 2021-04-27 DIAGNOSIS — Z881 Allergy status to other antibiotic agents status: Secondary | ICD-10-CM

## 2021-04-27 DIAGNOSIS — E559 Vitamin D deficiency, unspecified: Secondary | ICD-10-CM | POA: Diagnosis present

## 2021-04-27 LAB — POTASSIUM: Potassium: 3.8 mmol/L (ref 3.5–5.1)

## 2021-04-27 LAB — CBC WITH DIFFERENTIAL/PLATELET
Abs Immature Granulocytes: 0.15 10*3/uL — ABNORMAL HIGH (ref 0.00–0.07)
Basophils Absolute: 0 10*3/uL (ref 0.0–0.1)
Basophils Relative: 0 %
Eosinophils Absolute: 0 10*3/uL (ref 0.0–0.5)
Eosinophils Relative: 0 %
HCT: 36.9 % — ABNORMAL LOW (ref 39.0–52.0)
Hemoglobin: 12.3 g/dL — ABNORMAL LOW (ref 13.0–17.0)
Immature Granulocytes: 2 %
Lymphocytes Relative: 4 %
Lymphs Abs: 0.4 10*3/uL — ABNORMAL LOW (ref 0.7–4.0)
MCH: 32.1 pg (ref 26.0–34.0)
MCHC: 33.3 g/dL (ref 30.0–36.0)
MCV: 96.3 fL (ref 80.0–100.0)
Monocytes Absolute: 0.6 10*3/uL (ref 0.1–1.0)
Monocytes Relative: 8 %
Neutro Abs: 7.1 10*3/uL (ref 1.7–7.7)
Neutrophils Relative %: 86 %
Platelets: 199 10*3/uL (ref 150–400)
RBC: 3.83 MIL/uL — ABNORMAL LOW (ref 4.22–5.81)
RDW: 19.3 % — ABNORMAL HIGH (ref 11.5–15.5)
WBC: 8.2 10*3/uL (ref 4.0–10.5)
nRBC: 0 % (ref 0.0–0.2)

## 2021-04-27 LAB — COMPREHENSIVE METABOLIC PANEL
ALT: 23 U/L (ref 0–44)
AST: 70 U/L — ABNORMAL HIGH (ref 15–41)
Albumin: 3.8 g/dL (ref 3.5–5.0)
Alkaline Phosphatase: 120 U/L (ref 38–126)
Anion gap: 13 (ref 5–15)
BUN: 20 mg/dL (ref 8–23)
CO2: 22 mmol/L (ref 22–32)
Calcium: 8.6 mg/dL — ABNORMAL LOW (ref 8.9–10.3)
Chloride: 87 mmol/L — ABNORMAL LOW (ref 98–111)
Creatinine, Ser: 0.87 mg/dL (ref 0.61–1.24)
GFR, Estimated: >60 mL/min (ref >60)
Glucose, Bld: 95 mg/dL (ref 70–99)
Potassium: 5.6 mmol/L — ABNORMAL HIGH (ref 3.5–5.1)
Sodium: 122 mmol/L — ABNORMAL LOW (ref 135–145)
Total Bilirubin: 1.7 mg/dL — ABNORMAL HIGH (ref 0.3–1.2)
Total Protein: 7.1 g/dL (ref 6.5–8.1)

## 2021-04-27 LAB — OSMOLALITY: Osmolality: 262 mOsm/kg — ABNORMAL LOW (ref 275–295)

## 2021-04-27 LAB — POC OCCULT BLOOD, ED: Fecal Occult Bld: NEGATIVE

## 2021-04-27 LAB — PROTIME-INR
INR: 11.2 (ref 0.8–1.2)
Prothrombin Time: 86.2 seconds — ABNORMAL HIGH (ref 11.4–15.2)

## 2021-04-27 LAB — COAGUCHEK XS/INR WAIVED
INR: >8 (ref 0.9–1.1)
Prothrombin Time: >96 s

## 2021-04-27 MED ORDER — SODIUM CHLORIDE 0.9 % IV SOLN: INTRAVENOUS | Status: AC

## 2021-04-27 MED ORDER — POLYETHYLENE GLYCOL 3350 17 G PO PACK: 17.0000 g | PACK | Freq: Every day | ORAL | Status: DC | PRN

## 2021-04-27 MED ORDER — ENSURE ENLIVE PO LIQD
237.0000 mL | Freq: Two times a day (BID) | ORAL | Status: DC
  Administered 2021-04-28 – 2021-04-30 (×5): 237 mL via ORAL

## 2021-04-27 MED ORDER — ONDANSETRON HCL 4 MG PO TABS: 4.0000 mg | ORAL_TABLET | Freq: Four times a day (QID) | ORAL | Status: DC | PRN

## 2021-04-27 MED ORDER — ACETAMINOPHEN 325 MG PO TABS
650.0000 mg | ORAL_TABLET | Freq: Four times a day (QID) | ORAL | Status: DC | PRN
  Administered 2021-04-27 – 2021-04-30 (×7): 650 mg via ORAL
  Filled 2021-04-27 (×7): qty 2

## 2021-04-27 MED ORDER — ONDANSETRON HCL 4 MG/2ML IJ SOLN: 4.0000 mg | Freq: Four times a day (QID) | INTRAMUSCULAR | Status: DC | PRN

## 2021-04-27 MED ORDER — ACETAMINOPHEN 650 MG RE SUPP: 650.0000 mg | Freq: Four times a day (QID) | RECTAL | Status: DC | PRN

## 2021-04-27 MED ORDER — PANTOPRAZOLE SODIUM 40 MG PO TBEC
40.0000 mg | DELAYED_RELEASE_TABLET | Freq: Two times a day (BID) | ORAL | Status: DC
  Administered 2021-04-27 – 2021-04-30 (×5): 40 mg via ORAL
  Filled 2021-04-27 (×6): qty 1

## 2021-04-27 MED ORDER — SODIUM CHLORIDE 0.9 % IV BOLUS
1000.0000 mL | Freq: Once | INTRAVENOUS | Status: AC
  Administered 2021-04-27: 1000 mL via INTRAVENOUS

## 2021-04-27 MED ORDER — DEXTROSE 5 % IV SOLN
10.0000 mg | Freq: Once | INTRAVENOUS | Status: AC
  Administered 2021-04-27: 10 mg via INTRAVENOUS
  Filled 2021-04-27: qty 1

## 2021-04-27 NOTE — ED Notes (Signed)
Pt attempted to urinate ?

## 2021-04-27 NOTE — Assessment & Plan Note (Addendum)
Some of this may be from hyponatremia sodium of 122, but most likely has underlying dementia.  Presents with confusion, unsteady gait, multiple falls.  He has no respiratory urinary GU or GI symptoms.  Head CT without acute abnormality, suggest chronic small vessel ischemic disease. ?-Gentle hydration. ?-PT evaluation.  Patient's niece Danny [name] who helps take care of patient and his wife is undergoing knee replacement surgery tomorrow and will not be able to care for patient.  Disposition to be determined. ?

## 2021-04-27 NOTE — ED Notes (Signed)
Date and time results received: 04/27/21 12:48 PM ? ?Test: INR ?Critical Value: 11.2 ? ?Name of Provider Notified: Evalee Jefferson ? ?Orders Received? Or Actions Taken?: see orders ?

## 2021-04-27 NOTE — ED Triage Notes (Signed)
Pt came from Dr office via [facility]. Pt has been falling a lot and is more confused than normal. ?

## 2021-04-27 NOTE — Assessment & Plan Note (Signed)
Rate controlled on anticoagulation with warfarin. ?-Resume metoprolol  ?-Hold warfarin.  Niece reports recent falls, unsteady gait.  Family will need to consider risk of anticoagulation going forward.   ?

## 2021-04-27 NOTE — Assessment & Plan Note (Signed)
Blood pressure stable. ?-Resume metoprolol 50 mg daily  ?

## 2021-04-27 NOTE — Progress Notes (Signed)
? ?Assessment & Plan:  ?1-3. Paroxysmal atrial fibrillation (HCC)/Long term (current) use of anticoagulants/Supratherapeutic INR ?Description   ?INR >8.0 today ?Goal INR 2-3 ?Patient sent to ER given multiple other concerns. ?  ?- CoaguChek XS/INR Waived ? ?4. Confusion/History of recent fall/Weakness/Unsteady gait ?Given his increasing confusion, frequent falling, weakness, and unsteady gait on top of his supratherapeutic INR I am sending him to the ER. ? ? ?Return in about 6 weeks (around 06/08/2021) for follow-up of chronic medication conditions. ? ?[name], MSN, APRN, FNP-C ?[place] ? ?Subjective:  ? ? Patient ID: Danny Warne., male    DOB: [birth year], 88 y.o.   MRN: [id number] ? ?Patient Care Team: ?Loman Brooklyn, FNP as PCP - General (Family Medicine) ?Josue Hector, MD as PCP - Cardiology (Cardiology) ?Josue Hector, MD as Attending Physician (Cardiology) ?Fields, Marga Melnick, MD (Inactive) as Consulting Physician (Gastroenterology) ?Okey Regal, OD (Optometry) ?Rexene Alberts, MD (Inactive) as Consulting Physician (Cardiothoracic Surgery) ?Allyn Kenner, MD as Consulting Physician (Dermatology)  ? ?Chief Complaint:  ?Chief Complaint  ?Patient presents with  ? Coagulation Disorder  ? ? ?HPI: ?Danny Carsey. is a 88 y.o. male presenting on 04/27/2021 for Coagulation Disorder ? ?Anticoagulation: Patient here for anticoagulation monitoring. He is accompanied by his wife, who he is okay with being present.  ?Indication: atrial fibrillation ?Bleeding Signs/Symptoms: None ?Thromboembolic Signs/Symptoms: None ? ?Missed Coumadin Doses: None ?Medication Changes: last dose change in February at which time dose was decreased from 5 mg daily to 5 mg daily with 2.5 mg on Saturdays ?Dietary Changes:  No ?Bacterial/Viral Infection:  No ? ?New complaints: ?Patient's niece sent a message via MyChart to make me aware that patient has been more confused and unsteady on his feet. He  misplaced his cell phone for several days and couldn't find his car at [employer] last week. He is not eating or drinking well per her report.  ? ?Patient's wife reports today he has had multiple falls over the past few weeks.  He had a big fall 3 weeks ago which resulted in bruising to his left hip per her report.  She states he also fell yesterday; he was sitting in a chair when it turned over.  She states he is very confused this morning, worse than usual.  He did drive them over here today, but she states she has no idea how as he was very confused and did not know where he was going. ? ? ?Social history: ? ?Relevant past medical, surgical, family and social history reviewed and updated as indicated. Interim medical history since our last visit reviewed. ? ?Allergies and medications reviewed and updated. ? ?DATA REVIEWED: CHART IN EPIC ? ?ROS: Negative unless specifically indicated above in HPI.  ? ? ?Current Outpatient Medications:  ?  cholecalciferol (VITAMIN D) 25 MCG (1000 UNIT) tablet, Take 2,000 Units by mouth daily with breakfast., Disp: , Rfl:  ?  Ensure (ENSURE), Take 1 Can by mouth as needed., Disp: , Rfl:  ?  furosemide (LASIX) 40 MG tablet, Take 1 tablet (40 mg total) by mouth daily., Disp: 30 tablet, Rfl: 1 ?  levocetirizine (XYZAL) 5 MG tablet, Take 1 tablet (5 mg total) by mouth every evening., Disp: 90 tablet, Rfl: 1 ?  lovastatin (MEVACOR) 40 MG tablet, TAKE 1 TABLET AT BEDTIME, Disp: 90 tablet, Rfl: 1 ?  metoprolol succinate (TOPROL-XL) 50 MG 24 hr tablet, Take 1 tablet (50 mg total) by mouth 2 (  two) times daily. Take with or immediately following a meal., Disp: 60 tablet, Rfl: 1 ?  Multiple Vitamins-Minerals (PRESERVISION AREDS 2 PO), Take 1 tablet by mouth in the morning and at bedtime., Disp: , Rfl:  ?  nitroGLYCERIN (NITROSTAT) 0.4 MG SL tablet, PLACE 1 TABLET (0.4 MG TOTAL) UNDER THE TONGUE EVERY 5 (FIVE) MINUTES AS NEEDED FOR CHEST PAIN., Disp: 100 tablet, Rfl: 3 ?  Omega-3 Fatty Acids  (FISH OIL) 1200 MG CAPS, Take 2,400 mg by mouth daily., Disp: , Rfl:  ?  omeprazole (PRILOSEC) 20 MG capsule, TAKE 1 CAPSULE TWICE DAILY BEFORE MEALS, Disp: 180 capsule, Rfl: 0 ?  potassium chloride SA (KLOR-CON M) 20 MEQ tablet, Take 2 tablets (40 mEq total) by mouth daily., Disp: 60 tablet, Rfl: 1 ?  Probiotic Product (PHILLIPS COLON HEALTH PO), Take 1 capsule by mouth daily as needed., Disp: , Rfl:  ?  spironolactone (ALDACTONE) 25 MG tablet, Take 1 tablet (25 mg total) by mouth daily., Disp: 30 tablet, Rfl: 1 ?  warfarin (COUMADIN) 5 MG tablet, TAKE 1 TABLET EVERY DAY, Disp: 90 tablet, Rfl: 0  ? ?Allergies  ?Allergen Reactions  ? Doxycycline Diarrhea  ? Contrast Media [Iodinated Contrast Media]   ?  Syncope  ? Lunesta [Eszopiclone]   ?  "felt WILD"  ? Morphine And Related Nausea And Vomiting  ? Trazodone And Nefazodone Other (See Comments)  ?  Extremely drowsy the next day after taking.  ? ?Past Medical History:  ?Diagnosis Date  ? Anxiety   ? Atrial fibrillation ([place])   ? CAD, ARTERY BYPASS GRAFT 05/19/2008  ? Qualifier: Diagnosis of  By: Mare Ferrari, [place], [place]    ? Enteritis due to Clostridium difficile, history of 11/21/2012  ? GERD (gastroesophageal reflux disease)   ? Hyperlipidemia   ? Hypertension   ? Internal carotid artery stenosis   ? Shingles 06/2020  ? 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 Vitamin D deficiency   ?  ?Past Surgical History:  ?Procedure Laterality Date  ? CARDIOVERSION N/A 05/05/2016  ? Procedure: CARDIOVERSION;  Surgeon: Josue Hector, MD;  Location: [facility];  Service: Cardiovascular;  Laterality: N/A;  ? CAROTID ENDARTERECTOMY Left 2003  ? CAROTID-SUBCLAVIAN BYPASS GRAFT Right   ? COLONOSCOPY  2005  ? hyperplastic polyp x 1 with no adenoma  ? COLONOSCOPY N/A 04/11/2014  ? KPV:VZSM diverticulosis in the sigmoid colon/left colon is redundant  ? CORONARY ARTERY BYPASS GRAFT    ? ESOPHAGEAL DILATION N/A 04/11/2014  ? Procedure: ESOPHAGEAL DILATION;  Surgeon:  [name], MD;  [facility];  Service: Endoscopy;  Laterality: N/A;  ? ESOPHAGOGASTRODUODENOSCOPY N/A 04/11/2014  ? [id number]:[id number] at the gastro junction/moderate erosive/duodenal web  ? EYE SURGERY Bilateral   ? cataracts  ? RIGHT/LEFT HEART CATH AND CORONARY/GRAFT ANGIOGRAPHY N/A 02/11/2020  ? Procedure: RIGHT/LEFT HEART CATH AND CORONARY/GRAFT ANGIOGRAPHY;  Surgeon: Martinique, Peter M, MD;  Location: [facility];  Service: Cardiovascular;  Laterality: N/A;  ? SKIN CANCER EXCISION    ?  ?Social History  ? ?Socioeconomic History  ? Marital status: Married  ?  Spouse name: ruby  ? Number of children: 1  ? Years of education: Not on file  ? Highest education level: Not on file  ?Occupational History  ? Occupation: retired  ?  Employer: [employer]  ?Tobacco Use  ? Smoking status: Former  ?  Types: Cigarettes  ?  Start date: 01/24/1950  ?  Quit date:  01/24/1961  ?  Years since quitting: 60.2  ? Smokeless tobacco: Never  ?Vaping Use  ? Vaping Use: Never used  ?Substance and Sexual Activity  ? Alcohol use: Not Currently  ?  Alcohol/week: 0.0 standard drinks  ?  Comment: not drank since 12/2009  ? Drug use: No  ? Sexual activity: Yes  ?  Partners: Female  ?Other Topics Concern  ? Not on file  ?Social History Narrative  ? Not on file  ? ?Social Determinants of Health  ? ?Financial Resource Strain: Not on file  ?Food Insecurity: Not on file  ?Transportation Needs: Not on file  ?Physical Activity: Not on file  ?Stress: Not on file  ?Social Connections: Not on file  ?Intimate Partner Violence: Not on file  ?  ? ?   ?Objective:  ?  ?Ht '6\' 2"'$  (1.88 m)   BMI 21.00 kg/m?  ? ?Wt Readings from Last 3 Encounters:  ?03/17/21 163 lb 9.6 oz (74.2 kg)  ?03/11/21 163 lb 9.6 oz (74.2 kg)  ?03/05/21 164 lb (74.4 kg)  ? ? ?Physical Exam ?Vitals reviewed.  ?Constitutional:   ?   General: He is not in acute distress. ?   Appearance: Normal appearance. He is normal weight. He is not ill-appearing, toxic-appearing or diaphoretic.   ?HENT:  ?   Head: Normocephalic and atraumatic.  ?Eyes:  ?   General: No scleral icterus.    ?   Right eye: No discharge.     ?   Left eye: No discharge.  ?   Conjunctiva/sclera: Conjunctivae normal.  ?Cardiovascul

## 2021-04-27 NOTE — ED Provider Notes (Signed)
?[facility] ?Provider Note ? ? ?CSN: [id number] ?Arrival date & time: 04/27/21  0940 ? ?  ? ?History ? ?Chief Complaint  ?Patient presents with  ? Altered Mental Status  ? ? ?Danny [name]. is a 88 y.o. male with a history including hypertension, paroxysmal A-fib on Coumadin, GERD, pulmonary hypertension history of hyponatremia and possibly also some mild dementia presenting for evaluation of increasing weakness and confusion more than his baseline per family at bedside.  He has taken several falls over the past week, describing a large bruise at his left hip and buttock region, patient denies any pain at the site however.  He was seen by his PCP this morning who checked his INR and it was greater than 8.  He was promptly sent here for further evaluation.  Patient denies any bleeding, also denies dizziness, headache, head injury.  He denies any recent changes in his Coumadin dosing. ?  ?The history is provided by the patient and a relative.  ? ?  ? ?Home Medications ?Prior to Admission medications   ?Medication Sig Start Date End Date Taking? Authorizing Provider  ?cholecalciferol (VITAMIN D) 25 MCG (1000 UNIT) tablet Take 2,000 Units by mouth daily with breakfast.   Yes [provider]  ?Ensure (ENSURE) Take 1 Can by mouth as needed.   Yes [provider]  ?furosemide (LASIX) 40 MG tablet Take 1 tablet (40 mg total) by mouth daily. 02/10/21  Yes Johnson, Clanford L, MD  ?levocetirizine (XYZAL) 5 MG tablet Take 1 tablet (5 mg total) by mouth every evening. 03/31/21  Yes Loman Brooklyn, FNP  ?lovastatin (MEVACOR) 40 MG tablet TAKE 1 TABLET AT BEDTIME 03/15/21  Yes Loman Brooklyn, FNP  ?metoprolol succinate (TOPROL-XL) 50 MG 24 hr tablet Take 1 tablet (50 mg total) by mouth 2 (two) times daily. Take with or immediately following a meal. 02/10/21  Yes Johnson, Clanford L, MD  ?Multiple Vitamins-Minerals (PRESERVISION AREDS 2 PO) Take 1 tablet by mouth in the morning and at  bedtime.   Yes [provider]  ?nitroGLYCERIN (NITROSTAT) 0.4 MG SL tablet PLACE 1 TABLET (0.4 MG TOTAL) UNDER THE TONGUE EVERY 5 (FIVE) MINUTES AS NEEDED FOR CHEST PAIN. 01/13/20  Yes Josue Hector, MD  ?Omega-3 Fatty Acids (FISH OIL) 1200 MG CAPS Take 2,400 mg by mouth daily.   Yes [provider]  ?omeprazole (PRILOSEC) 20 MG capsule TAKE 1 CAPSULE TWICE DAILY BEFORE MEALS 03/31/21  Yes Loman Brooklyn, FNP  ?potassium chloride SA (KLOR-CON M) 20 MEQ tablet Take 2 tablets (40 mEq total) by mouth daily. 02/11/21  Yes Johnson, Clanford L, MD  ?Probiotic Product ([place]) Take 1 capsule by mouth daily as needed.   Yes [provider]  ?spironolactone (ALDACTONE) 25 MG tablet Take 1 tablet (25 mg total) by mouth daily. 02/11/21  Yes Johnson, Clanford L, MD  ?warfarin (COUMADIN) 5 MG tablet TAKE 1 TABLET EVERY DAY 12/02/20  Yes Loman Brooklyn, FNP  ?   ? ?Allergies    ?Doxycycline, Contrast media [iodinated contrast media], Lunesta [eszopiclone], Morphine and related, and Trazodone and nefazodone   ? ?Review of Systems   ?Review of Systems  ?Constitutional:  Negative for chills and fever.  ?HENT:  Negative for congestion, nosebleeds and sore throat.   ?Eyes: Negative.   ?Respiratory:  Negative for chest tightness and shortness of breath.   ?Cardiovascular:  Negative for chest pain.  ?Gastrointestinal:  Negative for abdominal pain,  blood in stool, nausea and vomiting.  ?Genitourinary: Negative.   ?Musculoskeletal:  Negative for arthralgias, joint swelling and neck pain.  ?Skin:  Positive for color change. Negative for rash and wound.  ?Neurological:  Negative for dizziness, weakness, light-headedness, numbness and headaches.  ?Psychiatric/Behavioral:  Positive for confusion.   ?     Per family, more confused, patient denies.  ? ?Physical Exam ?Updated Vital Signs ?BP 137/72   Pulse (!) 103   Temp 97.6 ?F (36.4 ?C) (Oral)   Resp (!) 24   Ht '6\' 2"'$  (1.88 m)   Wt 75.1 kg    SpO2 95%   BMI 21.26 kg/m?  ?Physical Exam ?Vitals and nursing note reviewed. Exam conducted with a chaperone present.  ?Constitutional:   ?   Appearance: He is well-developed.  ?HENT:  ?   Head: Normocephalic and atraumatic.  ?Eyes:  ?   Conjunctiva/sclera: Conjunctivae normal.  ?Cardiovascular:  ?   Rate and Rhythm: Normal rate and regular rhythm.  ?   Heart sounds: Normal heart sounds.  ?Pulmonary:  ?   Effort: Pulmonary effort is normal.  ?   Breath sounds: Normal breath sounds. No wheezing.  ?Abdominal:  ?   General: Bowel sounds are normal.  ?   Palpations: Abdomen is soft.  ?   Tenderness: There is no abdominal tenderness.  ?Genitourinary: ?   Rectum: Guaiac result negative.  ?   Comments: Hemoccult negative ?Musculoskeletal:     ?   General: Normal range of motion.  ?   Cervical back: Normal range of motion.  ?Skin: ?   General: Skin is warm and dry.  ?   Comments: Large nontender bruising over his left buttock and sacrum.  No edema.  ?Neurological:  ?   General: No focal deficit present.  ?   Mental Status: He is alert.  ?   Cranial Nerves: No cranial nerve deficit, dysarthria or facial asymmetry.  ?   Sensory: No sensory deficit.  ?   Motor: Motor function is intact.  ?   Coordination: Coordination normal.  ?   Comments: Oriented to person and place, not to time.  ? ? ?ED Results / Procedures / Treatments   ?Labs ?(all labs ordered are listed, but only abnormal results are displayed) ?Labs Reviewed  ?CBC WITH DIFFERENTIAL/PLATELET - Abnormal; Notable for the following components:  ?    Result Value  ? RBC 3.83 (*)   ? Hemoglobin 12.3 (*)   ? HCT 36.9 (*)   ? RDW 19.3 (*)   ? Lymphs Abs 0.4 (*)   ? Abs Immature Granulocytes 0.15 (*)   ? All other components within normal limits  ?COMPREHENSIVE METABOLIC PANEL - Abnormal; Notable for the following components:  ? Sodium 122 (*)   ? Potassium 5.6 (*)   ? Chloride 87 (*)   ? Calcium 8.6 (*)   ? AST 70 (*)   ? Total Bilirubin 1.7 (*)   ? All other components  within normal limits  ?PROTIME-INR - Abnormal; Notable for the following components:  ? Prothrombin Time 86.2 (*)   ? INR 11.2 (*)   ? All other components within normal limits  ?POTASSIUM  ?URINALYSIS, ROUTINE W REFLEX MICROSCOPIC  ?POC OCCULT BLOOD, ED  ? ? ?EKG ?None ? ?Radiology ?CT Head Wo Contrast ? ?Result Date: 04/27/2021 ?CLINICAL DATA:  Mental status change, persistent or worsening supratherapeutic INR per PCP's notes EXAM: CT HEAD WITHOUT CONTRAST TECHNIQUE: Contiguous axial images were obtained from the  base of the skull through the vertex without intravenous contrast. RADIATION DOSE REDUCTION: This exam was performed according to the departmental dose-optimization program which includes automated exposure control, adjustment of the mA and/or kV according to patient size and/or use of iterative reconstruction technique. COMPARISON:  Head CT 02/05/2021 FINDINGS: Brain: No evidence of acute intracranial hemorrhage or extra-axial collection.No evidence of mass lesion/concerning mass effect.The ventricles are normal in size.Scattered subcortical and periventricular white matter hypodensities, nonspecific but likely sequela of chronic small vessel ischemic disease. Vascular: No hyperdense vessel or unexpected calcification. Skull: Negative for skull fracture.  No focal bone lesion. Sinuses/Orbits: No acute findings. Other: None. IMPRESSION: No acute intracranial abnormality. Unchanged mild sequela of chronic small vessel ischemic disease. Electronically Signed   By: Maurine Simmering M.D.   On: 04/27/2021 11:40   ? ?Procedures ?Procedures  ? ? ?Medications Ordered in ED ?Medications  ?phytonadione (VITAMIN K) 10 mg in dextrose 5 % 50 mL IVPB (has no administration in time range)  ?sodium chloride 0.9 % bolus 1,000 mL (1,000 mLs Intravenous New Bag/Given 04/27/21 1438)  ? ? ?ED Course/ Medical Decision Making/ A&P ?  ?                        ?Medical Decision Making ?Patient with known supratherapeutic INR per testing  in his PCPs office, greater than 8 this morning.  Increased confusion.  Fall with large bruising left buttock.  Patient denies any other bleeding. ? ?Amount and/or Complexity of Data Reviewed ?Independent Historian:

## 2021-04-27 NOTE — Assessment & Plan Note (Signed)
Acute on chronic hyponatremia.  Admitted in January for same.,  Sodium improved with hydration but he developed hypervolemia and required Lasix.  Today- Sodium 122, baseline appears to be mid 120s to low 130s.  TSH normal  03/11/2021-1.6 ?-1 L bolus given in ED, continue n/s 75cc/hr x 12hrs. ?-Check serum and urine osmolality, urine sodium ? ?

## 2021-04-27 NOTE — H&P (Signed)
?History and Physical  ? ? ?Danny [name]. VXB:[id number] DOB: 11-02-1931 DOA: 04/27/2021 ? ?PCP: Loman Brooklyn, FNP  ? ?Patient coming from: Home ? ?I have personally briefly reviewed patient's old medical records in [place] ? ?Chief Complaint: Confusion ? ?HPI: Danny [name]. is a 88 y.o. male with medical history significant for hypertension, atrial fibrillation, CABG. ?Patient went to his primary care provider's office today, with reports of confusion, INR was checked and it was greater than 8 he was referred to the ED. ?At time of my evaluation, patient is awake alert oriented to person and place only, he is niece Chrys Racer is at bedside and assist with the history.  She reports over the past several months, patient has had intermittent confusion, with some days being worse than the other, sometimes he would forget to eat, so oral intake has been very poor. He has also had increasing unsteadiness, and multiple falls.  He has no assistive devices.  He would ask the same questions several times, and would not remember the question or the answer, sometimes he would just stare.  But over the past week his confusion has been worse, today he did not know where he was or why he was in the hospital or clinic. ?She reports over the past week patient has fallen twice.  She does not think he hit his head. ?No blood in stools, no bleeding from any orifices.  She is not aware of any recent antibiotic prescription, he has had very poor oral intake but no change in diet.  He has been taking his warfarin exactly as prescribed and his wife and niece monitor this. ?No difficulty breathing, no cough, no urinary symptoms, no vomiting no loose stools no pain. ? ?ED Course: Temperature 97.6.  Heart rate mostly 70s to 90s.  Respiratory rate 17-24.  Blood pressure systolic 1 teens to 330Q.  Sodium 122.  Hemoglobin stable at 12.3.  INR 11.2.  Head CT Without acute abnormality. ?1 L bolus given, ED provider talked to  pharmacy, recommended 10 mg of IV vitamin K which was given in ED. ? ?Review of Systems: As per HPI all other systems reviewed and negative. ? ?Past Medical History:  ?Diagnosis Date  ? Anxiety   ? Atrial fibrillation ([place])   ? CAD, ARTERY BYPASS GRAFT 05/19/2008  ? Qualifier: Diagnosis of  By: Mare Ferrari, [place], [place]    ? Enteritis due to Clostridium difficile, history of 11/21/2012  ? GERD (gastroesophageal reflux disease)   ? Hyperlipidemia   ? Hypertension   ? Internal carotid artery stenosis   ? Shingles 06/2020  ? 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 Vitamin D deficiency   ? ? ?Past Surgical History:  ?Procedure Laterality Date  ? CARDIOVERSION N/A 05/05/2016  ? Procedure: CARDIOVERSION;  Surgeon: Josue Hector, MD;  Location: [facility];  Service: Cardiovascular;  Laterality: N/A;  ? CAROTID ENDARTERECTOMY Left 2003  ? CAROTID-SUBCLAVIAN BYPASS GRAFT Right   ? COLONOSCOPY  2005  ? hyperplastic polyp x 1 with no adenoma  ? COLONOSCOPY N/A 04/11/2014  ? TMA:UQJF diverticulosis in the sigmoid colon/left colon is redundant  ? CORONARY ARTERY BYPASS GRAFT    ? ESOPHAGEAL DILATION N/A 04/11/2014  ? Procedure: ESOPHAGEAL DILATION;  Surgeon: [name], MD;  [facility];  Service: Endoscopy;  Laterality: N/A;  ? ESOPHAGOGASTRODUODENOSCOPY N/A 04/11/2014  ? HLK:TGYBWLSLH at the gastro junction/moderate erosive/duodenal web  ? EYE SURGERY  Bilateral   ? cataracts  ? RIGHT/LEFT HEART CATH AND CORONARY/GRAFT ANGIOGRAPHY N/A 02/11/2020  ? Procedure: RIGHT/LEFT HEART CATH AND CORONARY/GRAFT ANGIOGRAPHY;  Surgeon: Martinique, Peter M, MD;  Location: [facility];  Service: Cardiovascular;  Laterality: N/A;  ? SKIN CANCER EXCISION    ? ? ? reports that he quit smoking about 60 years ago. His smoking use included cigarettes. He started smoking about 71 years ago. He has never used smokeless tobacco. He reports that he does not currently use alcohol. He reports that he does not use  drugs. ? ?Allergies  ?Allergen Reactions  ? Doxycycline Diarrhea  ? Contrast Media [Iodinated Contrast Media]   ?  Syncope  ? Lunesta [Eszopiclone]   ?  "felt WILD"  ? Morphine And Related Nausea And Vomiting  ? Trazodone And Nefazodone Other (See Comments)  ?  Extremely drowsy the next day after taking.  ? ? ?Family History  ?Problem Relation Age of Onset  ? Heart disease Mother   ? Stroke Mother   ? Stroke Father   ? Diabetes Son   ? Stroke Son   ? Other Sister   ?     ruptured appendix   ? Lung cancer Brother   ?     asbestos  ? Mental illness Sister   ? Kidney disease Sister   ? Colon cancer Neg Hx   ?     "not that I know of."  ? ? ?Prior to Admission medications   ?Medication Sig Start Date End Date Taking? Authorizing Provider  ?cholecalciferol (VITAMIN D) 25 MCG (1000 UNIT) tablet Take 2,000 Units by mouth daily with breakfast.   Yes [provider]  ?Ensure (ENSURE) Take 1 Can by mouth as needed.   Yes [provider]  ?furosemide (LASIX) 40 MG tablet Take 1 tablet (40 mg total) by mouth daily. 02/10/21  Yes Johnson, Clanford L, MD  ?levocetirizine (XYZAL) 5 MG tablet Take 1 tablet (5 mg total) by mouth every evening. 03/31/21  Yes Loman Brooklyn, FNP  ?lovastatin (MEVACOR) 40 MG tablet TAKE 1 TABLET AT BEDTIME 03/15/21  Yes Loman Brooklyn, FNP  ?metoprolol succinate (TOPROL-XL) 50 MG 24 hr tablet Take 1 tablet (50 mg total) by mouth 2 (two) times daily. Take with or immediately following a meal. 02/10/21  Yes Johnson, Clanford L, MD  ?Multiple Vitamins-Minerals (PRESERVISION AREDS 2 PO) Take 1 tablet by mouth in the morning and at bedtime.   Yes [provider]  ?nitroGLYCERIN (NITROSTAT) 0.4 MG SL tablet PLACE 1 TABLET (0.4 MG TOTAL) UNDER THE TONGUE EVERY 5 (FIVE) MINUTES AS NEEDED FOR CHEST PAIN. 01/13/20  Yes Josue Hector, MD  ?Omega-3 Fatty Acids (FISH OIL) 1200 MG CAPS Take 2,400 mg by mouth daily.   Yes [provider]  ?omeprazole (PRILOSEC) 20 MG capsule TAKE  1 CAPSULE TWICE DAILY BEFORE MEALS 03/31/21  Yes Loman Brooklyn, FNP  ?potassium chloride SA (KLOR-CON M) 20 MEQ tablet Take 2 tablets (40 mEq total) by mouth daily. 02/11/21  Yes Johnson, Clanford L, MD  ?Probiotic Product ([place]) Take 1 capsule by mouth daily as needed.   Yes [provider]  ?spironolactone (ALDACTONE) 25 MG tablet Take 1 tablet (25 mg total) by mouth daily. 02/11/21  Yes Johnson, Clanford L, MD  ?warfarin (COUMADIN) 5 MG tablet TAKE 1 TABLET EVERY DAY 12/02/20  Yes Loman Brooklyn, FNP  ? ? ?Physical Exam: ?Vitals:  ? 04/27/21 1300 04/27/21 1330  04/27/21 1400 04/27/21 1430  ?BP: 127/81 (!) 154/94 131/88 137/72  ?Pulse: 79 79 89 (!) 103  ?Resp: '20 18 16 '$ (!) 24  ?Temp:      ?TempSrc:      ?SpO2: 100% 92% 93% 95%  ?Weight:      ?Height:      ? ? ?Constitutional: NAD, calm, comfortable ?Vitals:  ? 04/27/21 1300 04/27/21 1330 04/27/21 1400 04/27/21 1430  ?BP: 127/81 (!) 154/94 131/88 137/72  ?Pulse: 79 79 89 (!) 103  ?Resp: '20 18 16 '$ (!) 24  ?Temp:      ?TempSrc:      ?SpO2: 100% 92% 93% 95%  ?Weight:      ?Height:      ? ?Eyes: PERRL, lids and conjunctivae normal ?ENMT: Mucous membranes are dry   ?Neck: normal, supple, no masses, no thyromegaly ?Respiratory: clear to auscultation bilaterally, no wheezing, no crackles. Normal respiratory effort. No accessory muscle use.  ?Cardiovascular: Regular rate and rhythm, 2/6 systolic murmur-niece is not aware. No extremity edema. 2+ pedal pulses.  ?Abdomen: no tenderness, no masses palpated. No hepatosplenomegaly. Bowel sounds positive.  ?Musculoskeletal: no clubbing / cyanosis. No joint deformity upper and lower extremities. Good ROM, no contractures. Normal muscle tone.  ?Skin: no rashes, lesions, ulcers. No induration ?Neurologic: No apparent cranial nerve abnormality, 4/ 5 strength in all extremities ?Psychiatric: Awake, alert and oriented x person and place but not situation.. Normal mood.  ? ?Labs on Admission: I have personally  reviewed following labs and imaging studies ? ?CBC: ?Recent Labs  ?Lab 04/27/21 ?1117  ?WBC 8.2  ?NEUTROABS 7.1  ?HGB 12.3*  ?HCT 36.9*  ?MCV 96.3  ?PLT 199  ? ?Basic Metabolic Panel: ?Recent Labs  ?Lab 04/04/2

## 2021-04-27 NOTE — Assessment & Plan Note (Addendum)
INR 11.2.  Hemoglobin stable.  No evidence of bleeding.  Denies melena hematochezia hematemesis.  Niece is unaware of any change in medication in the past months at least, antibiotic prescription.  Patient taking medication as prescribed.  Only diet change - he has had chronic poor oral intake. ?-Hold warfarin ?-IV vitamin K 10 mg given in ED  ?-Daily INR ?-CBC in a.m. ?

## 2021-04-28 DIAGNOSIS — M25552 Pain in left hip: Secondary | ICD-10-CM | POA: Diagnosis present

## 2021-04-28 DIAGNOSIS — R791 Abnormal coagulation profile: Secondary | ICD-10-CM | POA: Diagnosis present

## 2021-04-28 DIAGNOSIS — I10 Essential (primary) hypertension: Secondary | ICD-10-CM | POA: Diagnosis present

## 2021-04-28 DIAGNOSIS — G9341 Metabolic encephalopathy: Secondary | ICD-10-CM | POA: Diagnosis present

## 2021-04-28 DIAGNOSIS — I4819 Other persistent atrial fibrillation: Secondary | ICD-10-CM | POA: Diagnosis present

## 2021-04-28 DIAGNOSIS — E871 Hypo-osmolality and hyponatremia: Secondary | ICD-10-CM | POA: Diagnosis present

## 2021-04-28 DIAGNOSIS — F0390 Unspecified dementia without behavioral disturbance: Secondary | ICD-10-CM | POA: Diagnosis present

## 2021-04-28 DIAGNOSIS — R296 Repeated falls: Secondary | ICD-10-CM | POA: Diagnosis present

## 2021-04-28 DIAGNOSIS — E559 Vitamin D deficiency, unspecified: Secondary | ICD-10-CM | POA: Diagnosis present

## 2021-04-28 DIAGNOSIS — I6529 Occlusion and stenosis of unspecified carotid artery: Secondary | ICD-10-CM | POA: Diagnosis present

## 2021-04-28 DIAGNOSIS — Z7901 Long term (current) use of anticoagulants: Secondary | ICD-10-CM | POA: Diagnosis not present

## 2021-04-28 DIAGNOSIS — R410 Disorientation, unspecified: Secondary | ICD-10-CM | POA: Diagnosis present

## 2021-04-28 DIAGNOSIS — Z885 Allergy status to narcotic agent status: Secondary | ICD-10-CM | POA: Diagnosis not present

## 2021-04-28 DIAGNOSIS — Z833 Family history of diabetes mellitus: Secondary | ICD-10-CM | POA: Diagnosis not present

## 2021-04-28 DIAGNOSIS — I272 Pulmonary hypertension, unspecified: Secondary | ICD-10-CM | POA: Diagnosis present

## 2021-04-28 DIAGNOSIS — Z801 Family history of malignant neoplasm of trachea, bronchus and lung: Secondary | ICD-10-CM | POA: Diagnosis not present

## 2021-04-28 DIAGNOSIS — Z85828 Personal history of other malignant neoplasm of skin: Secondary | ICD-10-CM | POA: Diagnosis not present

## 2021-04-28 DIAGNOSIS — Z823 Family history of stroke: Secondary | ICD-10-CM | POA: Diagnosis not present

## 2021-04-28 DIAGNOSIS — Z66 Do not resuscitate: Secondary | ICD-10-CM | POA: Diagnosis present

## 2021-04-28 DIAGNOSIS — Z8249 Family history of ischemic heart disease and other diseases of the circulatory system: Secondary | ICD-10-CM | POA: Diagnosis not present

## 2021-04-28 DIAGNOSIS — K219 Gastro-esophageal reflux disease without esophagitis: Secondary | ICD-10-CM | POA: Diagnosis present

## 2021-04-28 DIAGNOSIS — I2510 Atherosclerotic heart disease of native coronary artery without angina pectoris: Secondary | ICD-10-CM | POA: Diagnosis present

## 2021-04-28 DIAGNOSIS — E861 Hypovolemia: Secondary | ICD-10-CM | POA: Diagnosis present

## 2021-04-28 DIAGNOSIS — Z951 Presence of aortocoronary bypass graft: Secondary | ICD-10-CM | POA: Diagnosis not present

## 2021-04-28 DIAGNOSIS — F419 Anxiety disorder, unspecified: Secondary | ICD-10-CM | POA: Diagnosis present

## 2021-04-28 LAB — BASIC METABOLIC PANEL
Anion gap: 9 (ref 5–15)
BUN: 20 mg/dL (ref 8–23)
CO2: 21 mmol/L — ABNORMAL LOW (ref 22–32)
Calcium: 8 mg/dL — ABNORMAL LOW (ref 8.9–10.3)
Chloride: 94 mmol/L — ABNORMAL LOW (ref 98–111)
Creatinine, Ser: 0.95 mg/dL (ref 0.61–1.24)
GFR, Estimated: >60 mL/min (ref >60)
Glucose, Bld: 95 mg/dL (ref 70–99)
Potassium: 3.4 mmol/L — ABNORMAL LOW (ref 3.5–5.1)
Sodium: 124 mmol/L — ABNORMAL LOW (ref 135–145)

## 2021-04-28 LAB — CBC
HCT: 30 % — ABNORMAL LOW (ref 39.0–52.0)
Hemoglobin: 10.3 g/dL — ABNORMAL LOW (ref 13.0–17.0)
MCH: 32.8 pg (ref 26.0–34.0)
MCHC: 34.3 g/dL (ref 30.0–36.0)
MCV: 95.5 fL (ref 80.0–100.0)
Platelets: 202 10*3/uL (ref 150–400)
RBC: 3.14 MIL/uL — ABNORMAL LOW (ref 4.22–5.81)
RDW: 19.1 % — ABNORMAL HIGH (ref 11.5–15.5)
WBC: 6.2 10*3/uL (ref 4.0–10.5)
nRBC: 0 % (ref 0.0–0.2)

## 2021-04-28 LAB — PROTIME-INR
INR: 2.3 — ABNORMAL HIGH (ref 0.8–1.2)
Prothrombin Time: 24.7 seconds — ABNORMAL HIGH (ref 11.4–15.2)

## 2021-04-28 MED ORDER — LORAZEPAM 2 MG/ML IJ SOLN
1.0000 mg | Freq: Once | INTRAMUSCULAR | Status: AC
  Administered 2021-04-28: 1 mg via INTRAVENOUS
  Filled 2021-04-28: qty 1

## 2021-04-28 NOTE — Progress Notes (Signed)
Initial Nutrition Assessment ? ?DOCUMENTATION CODES:  ? ?  ? ?INTERVENTION:  ?Regular diet ? ?Ensure Enlive po BID, each supplement provides 350 kcal and 20 grams of protein.  ? ? ?NUTRITION DIAGNOSIS:  ? ?Inadequate oral intake related to acute illness as evidenced by meal completion < 25%. ? ? ?GOAL:  ?Patient will meet greater than or equal to 90% of their needs ? ? ?MONITOR:  ?PO intake, Supplement acceptance, Labs, Weight trends ? ?REASON FOR ASSESSMENT:  ? ?Malnutrition Screening Tool ?  ? ?ASSESSMENT:  ?88 yo with history of HTN, GERD, Vitamin D deficiency, atrial fibrillation and CABG. Presents with confusion. Acute metabolic encephalopathy, hyponatremia.  ? ?Patient lunch is here untouched but there is an empty Ensure bottle. Patient says he ate breakfast and then soon after lunch arrived and he was unable to eat anything. Able to feed himself. Patient is still driving. He lives with his wife. Their church family has been bringing food in for them because he wife has been ill and unable to prepare meals.  ? ?Weight history reviewed. Currently 75.1 kg. January -84.8 kg - patient hospitalized during that time and volume overloaded per MD note. In October his weight 77.6 kg.  ? ?Medications: Protonix.  ? ?Labs: ? ?  Latest Ref Rng & Units 04/28/2021  ?  5:48 AM 04/27/2021  ? 11:41 AM 04/27/2021  ? 11:17 AM  ?BMP  ?Glucose 70 - 99 mg/dL 95    95    ?BUN 8 - 23 mg/dL 20    20    ?Creatinine 0.61 - 1.24 mg/dL 0.95    0.87    ?Sodium 135 - 145 mmol/L 124    122    ?Potassium 3.5 - 5.1 mmol/L 3.4   3.8   5.6    ?Chloride 98 - 111 mmol/L 94    87    ?CO2 22 - 32 mmol/L 21    22    ?Calcium 8.9 - 10.3 mg/dL 8.0    8.6    ?  ?  ?NUTRITION - FOCUSED PHYSICAL EXAM: ?Unable to complete Nutrition-Focused physical exam at this time.   ? ? ?Diet Order:   ?Diet Order   ? ?       ?  Diet regular Room service appropriate? Yes; Fluid consistency: Thin  Diet effective now       ?  ? ?  ?  ? ?  ? ? ?EDUCATION NEEDS:  ?Not appropriate  for education at this time ? ?Skin:  Skin Assessment: Reviewed RN Assessment ? ?Last BM:  unknown ? ?Height:  ? ?Ht Readings from Last 1 Encounters:  ?04/27/21 '6\' 2"'$  (1.88 m)  ? ? ?Weight:  ? ?Wt Readings from Last 1 Encounters:  ?04/27/21 75.1 kg  ? ? ?Ideal Body Weight:   86 kg ? ?BMI:  Body mass index is 21.26 kg/m?. ? ?Estimated Nutritional Needs:  ? ?Kcal:  1900-2100 ? ?Protein:  90-97 gr ? ?Fluid:  2 liters daily ? ? ?Colman Cater MS,RD,CSG,LDN ?Contact: [facility] ?

## 2021-04-28 NOTE — Progress Notes (Signed)
Patient complaining of lower back pain and was previously given Tylenol with no relief. Patient disoriented x2 and frequently getting up. Tremors noted. Patient stated he usually has some beers every day, but hasn't had one in two or three days. Dr. Clearence Ped notified. New order placed for Ativan '1mg'$  IV once ordered by MD and administered.  ?

## 2021-04-28 NOTE — TOC Initial Note (Addendum)
Transition of Care (TOC) - Initial/Assessment Note  ? ? ?Patient Details  ?Name: Danny [name]. ?MRN: [id number] ?Date of Birth: 1931-09-13 ? ?Transition of Care (TOC) CM/SW Contact:    ?Kerin Salen, RN ?Phone Number: ?04/28/2021, 3:35 PM ? ?Clinical Narrative: Patient with AMS, with abnormal labs, arrived from PCP office. Spoke with wife who says patient confusion episodes declined during the past few weeks. However patient did drive to his medical visit yesterday with her directing him. Patient also with SOB and unsteady gait, would not use assisted device. [facility] services provided last visit 3 weeks ago. Patient get meds from [facility], wife says she fills pill box and give him meds due to confusion. Niece that assist them is now having knee surgery and will not be available, therefore she called PCP office to request [facility] services and informed that they will send referral. Wife says she is also not in good health with constant back problems/pain. TOC to notify Attending and assist with discharge plans. FL2 completed bedsearch started.   ?4:27pm Georgina Snell from [facility] says patient was discharge from service last month however if needed they can service him again.       ? ? ?Expected Discharge Plan: [place] ?Barriers to Discharge: Continued Medical Work up ? ? ?Patient Goals and CMS Choice ?Patient states their goals for this hospitalization and ongoing recovery are:: Patient has AMS, wife feels he needs SNF. ?  ?  ? ?Expected Discharge Plan and Services ?Expected Discharge Plan: [place] ?  ?  ?  ?  ?                ?  ?  ?  ?  ?  ?  ?  ?  ?  ?  ? ?Prior Living Arrangements/Services ?  ?Lives with:: Spouse ?  ?Do you feel safe going back to the place where you live?:  (Confused spoke with Wife.)      ?Need for Family Participation in Patient Care: Yes (Comment) ?Care giver support system in place?: No (comment) (Wife says she is unable to care for patient.) ?Current home  services: Home PT [facility]) ?Criminal Activity/Legal Involvement Pertinent to Current Situation/Hospitalization: No - Comment as needed ? ?Activities of Daily Living ?Home Assistive Devices/Equipment: None ?ADL Screening (condition at time of admission) ?Patient's cognitive ability adequate to safely complete daily activities?: No ?Is the patient deaf or have difficulty hearing?: Yes ?Does the patient have difficulty seeing, even when wearing glasses/contacts?: No ?Does the patient have difficulty concentrating, remembering, or making decisions?: Yes ?Patient able to express need for assistance with ADLs?: Yes ?Does the patient have difficulty dressing or bathing?: Yes ?Independently performs ADLs?: Yes (appropriate for developmental age) ?Does the patient have difficulty walking or climbing stairs?: Yes ?Weakness of Legs: Both ?Weakness of Arms/Hands: None ? ?Permission Sought/Granted ?  ?  ?   ?   ?   ?   ? ?Emotional Assessment ?  ?Attitude/Demeanor/Rapport: Unable to Assess ?Affect (typically observed): Unable to Assess ?  ?Alcohol / Substance Use: Not Applicable ?Psych Involvement: No (comment) ? ?Admission diagnosis:  Confusion [R41.0] ?Hyponatremia [E87.1] ?Supratherapeutic INR [R79.1] ?Acute metabolic encephalopathy [U63.33] ?Patient Active Problem List  ? Diagnosis Date Noted  ? Acute metabolic encephalopathy 54/56/2563  ? Atrial fibrillation with RVR ([place])   ? Hyponatremia 02/05/2021  ? Supratherapeutic INR 02/05/2021  ? Edema of both lower extremities 04/30/2020  ? Gastroesophageal reflux disease 04/30/2020  ? Pulmonary HTN ([place])  02/11/2020  ? Persistent atrial fibrillation ([place]) 10/14/2019  ? Secondary hypercoagulable state ([place]) 10/14/2019  ? Vitamin D deficiency   ? Difficulty sleeping 10/02/2018  ? Paroxysmal atrial fibrillation ([place]) 08/01/2016  ? Long term (current) use of anticoagulants [Z79.01] 08/01/2016  ? Thoracic aorta atherosclerosis ([place]) 01/14/2014  ? Hyperplasia of prostate 05/18/2010  ?  Carotid stenosis 05/18/2010  ? HYPERCHOLESTEROLEMIA  IIA 05/19/2008  ? Essential hypertension 05/19/2008  ? 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 ?PCP:  Loman Brooklyn, FNP ?Pharmacy:   ?[facility], [place] [place] ?[address] ?[place] [address] ?Phone: [phone] Fax: [phone] ? ?[facility], [place] [address] ?[place] [address] ?Phone: [phone] Fax: [phone] ? ? ? ? ?Social Determinants of Health (SDOH) Interventions ?  ? ?Readmission Risk Interventions ?   ? View : No data to display.  ?  ?  ?  ? ? ? ?

## 2021-04-28 NOTE — Evaluation (Signed)
Physical Therapy Evaluation ?Patient Details ?Name: Danny [name]. ?MRN: [id number] ?DOB: 1931-10-27 ?Today's Date: 04/28/2021 ? ?History of Present Illness ? Danny [name]. is a 88 y.o. male with medical history significant for hypertension, atrial fibrillation, CABG.  Patient went to his primary care provider's office today, with reports of confusion, INR was checked and it was greater than 8 he was referred to the ED.  At time of my evaluation, patient is awake alert oriented to person and place only, he is niece Chrys Racer is at bedside and assist with the history.  She reports over the past several months, patient has had intermittent confusion, with some days being worse than the other, sometimes he would forget to eat, so oral intake has been very poor. He has also had increasing unsteadiness, and multiple falls.  He has no assistive devices.  He would ask the same questions several times, and would not remember the question or the answer, sometimes he would just stare.  But over the past week his confusion has been worse, today he did not know where he was or why he was in the hospital or clinic.  She reports over the past week patient has fallen twice.  She does not think he hit his head.  No blood in stools, no bleeding from any orifices.  She is not aware of any recent antibiotic prescription, he has had very poor oral intake but no change in diet.  He has been taking his warfarin exactly as prescribed and his wife and niece monitor this.  No difficulty breathing, no cough, no urinary symptoms, no vomiting no loose stools no pain. ?  ?Clinical Impression ? Patient presents supine in bed, is lethargic but easily roused. He is alert, but oriented to person only.  Patient is Mod I with bed mobility but requires min A for sit to stand transfer. Somewhat unsteady standing without support but does well with RW. Patient required frequent cues to maintain attention to task during ambulation but was able to walk  assisted in hallway with no LOB. Patient did note some LLE fatigue. Returned to bed with bed alarm set, phone and call bell in reach. Likely HH candidate if has family support as stated but patient is poor historian. Would recommend SNF per mentation and safety awareness if he is not able to be supervised and assisted at home. Patient will benefit from continued physical therapy in hospital and recommended venue below to increase strength, balance, endurance for safe ADLs and gait.  ? ?   ? ?Recommendations for follow up therapy are one component of a multi-disciplinary discharge planning process, led by the attending physician.  Recommendations may be updated based on patient status, additional functional criteria and insurance authorization. ? ?Follow Up Recommendations Other (comment) (Home health if has family support if stated, otherwise SNF candidate per mentation and safety awareness) ? ?  ?Assistance Recommended at Discharge Intermittent Supervision/Assistance  ?Patient can return home with the following ? A little help with walking and/or transfers;A little help with bathing/dressing/bathroom;Help with stairs or ramp for entrance;Assist for transportation ? ?  ?Equipment Recommendations Rolling walker (2 wheels)  ?Recommendations for Other Services ?    ?  ?Functional Status Assessment Patient has had a recent decline in their functional status and demonstrates the ability to make significant improvements in function in a reasonable and predictable amount of time.  ? ?  ?Precautions / Restrictions Precautions ?Precautions: Fall ?Restrictions ?Weight Bearing Restrictions: No  ? ?  ? ?  Mobility ? Bed Mobility ?Overal bed mobility: Modified Independent, Needs Assistance ?Bed Mobility: Supine to Sit, Sit to Supine ?  ?  ?Supine to sit: Modified independent (Device/Increase time) ?Sit to supine: Modified independent (Device/Increase time) ?  ?General bed mobility comments: slow, labored movement, use of hands ?   ? ?Transfers ?Overall transfer level: Needs assistance ?Equipment used: Rolling walker (2 wheels) ?Transfers: Sit to/from Stand ?Sit to Stand: Min assist ?  ?  ?  ?  ?  ?General transfer comment: slow, labored movement ?  ? ?Ambulation/Gait ?Ambulation/Gait assistance: Supervision, Min guard ?Gait Distance (Feet): 20 Feet ?Assistive device: Rolling walker (2 wheels) ?Gait Pattern/deviations: Decreased stride length ?Gait velocity: decreased ?  ?  ?General Gait Details: small step gait, cues for attention to task, easily distracted ? ?Stairs ?  ?  ?  ?  ?  ? ?Wheelchair Mobility ?  ? ?Modified Rankin (Stroke Patients Only) ?  ? ?  ? ?Balance Overall balance assessment: Needs assistance ?Sitting-balance support: No upper extremity supported, Feet supported ?Sitting balance-Leahy Scale: Fair ?Sitting balance - Comments: seated at EOB ?  ?Standing balance support: During functional activity, No upper extremity supported ?Standing balance-Leahy Scale: Fair ?Standing balance comment: standing at bedside ?  ?  ?  ?  ?  ?  ?  ?  ?  ?  ?  ?   ? ? ? ?Pertinent Vitals/Pain Pain Assessment ?Pain Assessment: No/denies pain  ? ? ?Home Living Family/patient expects to be discharged to:: Private residence ?Living Arrangements: Spouse/significant other ?Available Help at Discharge: Family;Available 24 hours/day ?Type of Home: House ?Home Access: Stairs to enter ?  ?Entrance Stairs-Number of Steps: 1 step up into house ?  ?Home Layout: Two level;Able to live on main level with bedroom/bathroom;Full bath on main level ?Home Equipment: Conservation officer, nature (2 wheels);Crutches;Cane - single point;Wheelchair - manual;BSC/3in1;Shower seat;Grab bars - tub/shower ?Additional Comments: Per previous hospital admission (patient poor historian and unable to detail equipment owned)  ?  ?Prior Function Prior Level of Function : Independent/Modified Independent ?  ?  ?  ?  ?  ?  ?Mobility Comments: Hydrographic surveyor, drives, "per patient" ?  ?   ? ? ?Hand Dominance  ?   ? ?  ?Extremity/Trunk Assessment  ? Upper Extremity Assessment ?Upper Extremity Assessment: Defer to OT evaluation ?  ? ?Lower Extremity Assessment ?Lower Extremity Assessment: Overall WFL for tasks assessed ?  ? ?   ?Communication  ? Communication: No difficulties  ?Cognition Arousal/Alertness: Awake/alert ?Behavior During Therapy: [facility] for tasks assessed/performed ?Overall Cognitive Status: Impaired/Different from baseline ?Area of Impairment: Orientation ?  ?  ?  ?  ?  ?  ?  ?  ?Orientation Level: Place, Time, Disoriented to ?  ?  ?  ?  ?  ?  ?  ?  ?  ? ?  ?General Comments   ? ?  ?Exercises    ? ?Assessment/Plan  ?  ?PT Assessment Patient needs continued PT services  ?PT Problem List Decreased strength;Decreased mobility;Decreased safety awareness;Decreased balance;Decreased cognition;Decreased activity tolerance ? ?   ?  ?PT Treatment Interventions Gait training;Balance training;Therapeutic exercise;DME instruction;Therapeutic activities;Patient/family education;Functional mobility training;Neuromuscular re-education;Manual techniques   ? ?PT Goals (Current goals can be found in the Care Plan section)  ?Acute Rehab PT Goals ?Patient Stated Goal: Return home ?PT Goal Formulation: With patient ?Time For Goal Achievement: 05/12/21 ?Potential to Achieve Goals: Good ? ?  ?Frequency Min 3X/week ?  ? ? ?Co-evaluation   ?  ?  ?  ?  ? ? ?  ?  AM-PAC PT "6 Clicks" Mobility  ?Outcome Measure Help needed turning from your back to your side while in a flat bed without using bedrails?: None ?Help needed moving from lying on your back to sitting on the side of a flat bed without using bedrails?: A Little ?Help needed moving to and from a bed to a chair (including a wheelchair)?: A Little ?Help needed standing up from a chair using your arms (e.g., wheelchair or bedside chair)?: A Little ?Help needed to walk in hospital room?: A Little ?Help needed climbing 3-5 steps with a railing? : A Lot ?6 Click  Score: 18 ? ?  ?End of Session Equipment Utilized During Treatment: Gait belt ?Activity Tolerance: Patient tolerated treatment well ?Patient left: in bed;with call bell/phone within reach;with bed alarm set ?Nurs

## 2021-04-28 NOTE — Plan of Care (Signed)
?  Problem: Acute Rehab PT Goals(only PT should resolve) ?Goal: Patient Will Transfer Sit To/From Stand ?Flowsheets (Taken 04/28/2021 [phone]) ?Patient will transfer sit to/from stand: with modified independence ?Goal: Pt Will Transfer Bed To Chair/Chair To Bed ?Flowsheets (Taken 04/28/2021 [phone]) ?Pt will Transfer Bed to Chair/Chair to Bed: with modified independence ?Goal: Pt Will Ambulate ?Flowsheets (Taken 04/28/2021 [phone]) ?Pt will Ambulate: ? 100 feet ? with rolling walker ?Goal: Pt/caregiver will Perform Home Exercise Program ?Flowsheets (Taken 04/28/2021 [phone]) ?Pt/caregiver will Perform Home Exercise Program: ? For increased strengthening ? For improved balance ? With minimal assist ? ? ?9:52 AM, 04/28/21 ?Josue Hector PT DPT  ?Physical Therapist with [place]  ?[facility]  ?[phone] ? ?  ?

## 2021-04-28 NOTE — [place] FL2 (Signed)
?[place] MEDICAID FL2 LEVEL OF CARE SCREENING TOOL  ?  ? ?IDENTIFICATION  ?Patient Name: ?Danny [name]. Birthdate: September 21, 1931 Sex: male Admission Date (Current Location): ?04/27/2021  ?[place] and [place] Number: ? [place] and Address:  ?[facility] [address], [place] ?     Provider Number: ?[id number]  ?Attending Physician Name and Address:  ?Little Ishikawa, MD ? Relative Name and Phone Number:  ?Lekeith Wulf wife [phone] ?   ?Current Level of Care: ?Hospital Recommended Level of Care: ?[place] Prior Approval Number: ?  ? ?Date Approved/Denied: ?  PASRR Number: ?[id number] A ? ?Discharge Plan: ?SNF ?  ? ?Current Diagnoses: ?Patient Active Problem List  ? Diagnosis Date Noted  ? Acute metabolic encephalopathy 48/88/9169  ? Atrial fibrillation with RVR ([place])   ? Hyponatremia 02/05/2021  ? Supratherapeutic INR 02/05/2021  ? Edema of both lower extremities 04/30/2020  ? Gastroesophageal reflux disease 04/30/2020  ? Pulmonary HTN ([place]) 02/11/2020  ? Persistent atrial fibrillation ([place]) 10/14/2019  ? Secondary hypercoagulable state ([place]) 10/14/2019  ? Vitamin D deficiency   ? Difficulty sleeping 10/02/2018  ? Paroxysmal atrial fibrillation ([place]) 08/01/2016  ? Long term (current) use of anticoagulants [Z79.01] 08/01/2016  ? Thoracic aorta atherosclerosis ([place]) 01/14/2014  ? Hyperplasia of prostate 05/18/2010  ? Carotid stenosis 05/18/2010  ? HYPERCHOLESTEROLEMIA  IIA 05/19/2008  ? Essential hypertension 05/19/2008  ? 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 ? ?Orientation RESPIRATION BLADDER Height & Weight   ?  ?Self ? Normal Continent Weight: 75.1 kg ?Height:  '6\' 2"'$  (188 cm)  ?BEHAVIORAL SYMPTOMS/MOOD NEUROLOGICAL BOWEL NUTRITION STATUS  ?    Continent Diet  ?AMBULATORY STATUS COMMUNICATION OF NEEDS Skin   ?Limited Assist Verbally Normal ?  ?  ?  ?    ?     ?     ? ? ?Personal Care Assistance Level of Assistance  ?Bathing, Feeding, Dressing Bathing  Assistance: Limited assistance ?Feeding assistance: Limited assistance ?Dressing Assistance: Limited assistance ?   ? ?Functional Limitations Info  ?Sight, Hearing, Speech Sight Info: Impaired ?Hearing Info: Impaired ?Speech Info: Adequate  ? ? ?SPECIAL CARE FACTORS FREQUENCY  ?PT (By licensed PT), OT (By licensed OT)   ?  ?PT Frequency: 5X WEEK ?OT Frequency: 5X WEEK ?  ?  ?  ?   ? ? ?Contractures Contractures Info: Not present  ? ? ?Additional Factors Info  ?Code Status, Allergies Code Status Info: DNR ?Allergies Info: Contrast Media, Trazadone, L[place], Morphine, Nefazodone ?  ?  ?  ?   ? ?Current Medications (04/28/2021):  This is the current hospital active medication list ?Current Facility-Administered Medications  ?Medication Dose Route Frequency Provider Last Rate Last Admin  ? acetaminophen (TYLENOL) tablet 650 mg  650 mg Oral Q6H PRN Emokpae, Ejiroghene E, MD   650 mg at 04/28/21 1322  ? Or  ? acetaminophen (TYLENOL) suppository 650 mg  650 mg Rectal Q6H PRN Emokpae, Ejiroghene E, MD      ? feeding supplement (ENSURE ENLIVE / ENSURE PLUS) liquid 237 mL  237 mL Oral BID BM Emokpae, Ejiroghene E, MD   237 mL at 04/28/21 1039  ? ondansetron (ZOFRAN) tablet 4 mg  4 mg Oral Q6H PRN Emokpae, Ejiroghene E, MD      ? Or  ? ondansetron (ZOFRAN) injection 4 mg  4 mg Intravenous Q6H PRN Emokpae, Ejiroghene E, MD      ? pantoprazole (PROTONIX) EC tablet 40 mg  40  mg Oral BID Emokpae, Ejiroghene E, MD   40 mg at 04/28/21 1008  ? polyethylene glycol (MIRALAX / GLYCOLAX) packet 17 g  17 g Oral Daily PRN Emokpae, Ejiroghene E, MD      ? ? ? ?Discharge Medications: ?Please see discharge summary for a list of discharge medications. ? ?Relevant Imaging Results: ? ?Relevant Lab Results: ? ? ?Additional Information ?SS# [id number] ? ?Kerin Salen, RN ? ? ? ? ?

## 2021-04-28 NOTE — Progress Notes (Signed)
?PROGRESS NOTE ? ? ? ?Danny [name].  XTG:[id number] DOB: May 22, 1931 DOA: 04/27/2021 ?PCP: Loman Brooklyn, FNP ? ? ?Brief Narrative:  ?Danny [name]. is a 88 y.o. male with medical history significant for hypertension, atrial fibrillation, CABG. Patient went to his primary care provider's office today, with reports of confusion, INR was checked and it was greater than 8 he was referred to the ED. ? ?Assessment & Plan: ?  ?Principal Problem: ?  Supratherapeutic INR ?Active Problems: ?  Acute metabolic encephalopathy ?  Hyponatremia ?  Essential hypertension ?  Long term (current) use of anticoagulants [Z79.01] ?  Persistent atrial fibrillation ([place]) ? ?Supratherapeutic INR, resolving ?INR 11.2 at intake ?No signs or symptoms of bleeding, CT head negative ?Continue to hold warfarin, follow repeat INR (vitamin K given in the ED at intake) ?Lab Results  ?Component Value Date  ? INR 2.3 (H) 04/28/2021  ? INR 11.2 (HH) 04/27/2021  ? INR >8.0 (HH) 04/27/2021  ?  ?Acute metabolic encephalopathy ?Unclear etiology ?Hyponatremia as below acute on chronic, not profoundly abnormal from his baseline ?Concern for advancing dementia ?CT head unremarkable as above, without focal deficits ? ?Acute ambulatory dysfunction, multifactorial ?Likely secondary to acute metabolic encephalopathy and poor p.o. intake ?Increased falls over the past few weeks, remains high risk for bleeding given elevated INR ?Pending disposition to SNF ?  ?Hyponatremia, acute on chronic ?Likely hypovolemic in nature, resolved with IV fluids ?Baseline sodium mid 120s-130 ?Urine studies pending  ?  ?Persistent atrial fibrillation ([place]) ?Currently rate controlled, continue metoprolol ?INR downtrending as above, will need to follow-up with PCP versus cardiology for further discussion of warfarin usage ongoing in the setting of advanced age and multiple falls reported at intake. ?  ?Essential hypertension ?Continue metoprolol ?  ?DVT prophylaxis: SCDs ?Code  Status: DNR ?Family Communication: Niece - Chrys Racer unable to be reached by phone. ? ?Status is: Inpatient ? ?Dispo: The patient is from: Home ?             Anticipated d/c is to: SNF ?             Anticipated d/c date is: 24 to 48 hours ?             Patient currently not medically stable for discharge, awaiting safe disposition at rehab ? ?Consultants:  ?None ? ?Procedures:  ?None ? ?Antimicrobials:  ?None ? ?Subjective: ?No acute issues or events overnight, patient alert and oriented to person only denies headache fever chills nausea vomiting diarrhea constipation. ? ?Objective: ?Vitals:  ? 04/27/21 1934 04/27/21 2005 04/28/21 0200 04/28/21 0430  ?BP: (!) 108/56 114/62 110/60 115/65  ?Pulse: 97 77 65 96  ?Resp: '18 18 18 20  '$ ?Temp: 97.7 ?F (36.5 ?C) 98 ?F (36.7 ?C) 98.1 ?F (36.7 ?C) 98 ?F (36.7 ?C)  ?TempSrc: Oral  Oral   ?SpO2: 93% 97% 98% 98%  ?Weight:      ?Height:      ? ? ?Intake/Output Summary (Last 24 hours) at 04/28/2021 0645 ?Last data filed at 04/28/2021 0600 ?Gross per 24 hour  ?Intake 939.21 ml  ?Output --  ?Net 939.21 ml  ? ?Filed Weights  ? 04/27/21 0949  ?Weight: 75.1 kg  ? ? ?Examination: ? ?General:  Pleasantly resting in bed, No acute distress. ?HEENT:  Normocephalic atraumatic.  Sclerae nonicteric, noninjected.  Extraocular movements intact bilaterally. ?Neck:  Without mass or deformity.  Trachea is midline. ?Lungs:  Clear to auscultate bilaterally without rhonchi, wheeze, or rales. ?  Heart: Irregularly irregular.  Without murmurs, rubs, or gallops. ?Abdomen:  Soft, nontender, nondistended.  Without guarding or rebound. ?Extremities: Without cyanosis, clubbing, edema, or obvious deformity. ?Vascular:  Dorsalis pedis and posterior tibial pulses palpable bilaterally. ?Skin:  Warm and dry, no erythema, no ulcerations. ? ?Data Reviewed: I have personally reviewed following labs and imaging studies ? ?CBC: ?Recent Labs  ?Lab 04/27/21 ?1117 04/28/21 ?2229  ?WBC 8.2 6.2  ?NEUTROABS 7.1  --   ?HGB 12.3*  10.3*  ?HCT 36.9* 30.0*  ?MCV 96.3 95.5  ?PLT 199 202  ? ?Basic Metabolic Panel: ?Recent Labs  ?Lab 04/27/21 ?1117 04/27/21 ?1141 04/28/21 ?7989  ?NA 122*  --  124*  ?K 5.6* 3.8 3.4*  ?CL 87*  --  94*  ?CO2 22  --  21*  ?GLUCOSE 95  --  95  ?BUN 20  --  20  ?CREATININE 0.87  --  0.95  ?CALCIUM 8.6*  --  8.0*  ? ?GFR: ?Estimated Creatinine Clearance: 57.1 mL/min (by C-G formula based on SCr of 0.95 mg/dL). ?Liver Function Tests: ?Recent Labs  ?Lab 04/27/21 ?1117  ?AST 70*  ?ALT 23  ?ALKPHOS 120  ?BILITOT 1.7*  ?PROT 7.1  ?ALBUMIN 3.8  ? ?No results for input(s): LIPASE, AMYLASE in the last 168 hours. ?No results for input(s): AMMONIA in the last 168 hours. ?Coagulation Profile: ?Recent Labs  ?Lab 04/27/21 ?0809 04/27/21 ?1141  ?INR >8.0* 11.2*  ? ?Cardiac Enzymes: ?No results for input(s): CKTOTAL, CKMB, CKMBINDEX, TROPONINI in the last 168 hours. ?BNP (last 3 results) ?No results for input(s): PROBNP in the last 8760 hours. ?HbA1C: ?No results for input(s): HGBA1C in the last 72 hours. ?CBG: ?No results for input(s): GLUCAP in the last 168 hours. ?Lipid Profile: ?No results for input(s): CHOL, HDL, LDLCALC, TRIG, CHOLHDL, LDLDIRECT in the last 72 hours. ?Thyroid Function Tests: ?No results for input(s): TSH, T4TOTAL, FREET4, T3FREE, THYROIDAB in the last 72 hours. ?Anemia Panel: ?No results for input(s): VITAMINB12, FOLATE, FERRITIN, TIBC, IRON, RETICCTPCT in the last 72 hours. ?Sepsis Labs: ?No results for input(s): PROCALCITON, LATICACIDVEN in the last 168 hours. ? ?No results found for this or any previous visit (from the past 240 hour(s)).  ? ? ? ? ? ?Radiology Studies: ?CT Head Wo Contrast ? ?Result Date: 04/27/2021 ?CLINICAL DATA:  Mental status change, persistent or worsening supratherapeutic INR per PCP's notes EXAM: CT HEAD WITHOUT CONTRAST TECHNIQUE: Contiguous axial images were obtained from the base of the skull through the vertex without intravenous contrast. RADIATION DOSE REDUCTION: This exam was  performed according to the departmental dose-optimization program which includes automated exposure control, adjustment of the mA and/or kV according to patient size and/or use of iterative reconstruction technique. COMPARISON:  Head CT 02/05/2021 FINDINGS: Brain: No evidence of acute intracranial hemorrhage or extra-axial collection.No evidence of mass lesion/concerning mass effect.The ventricles are normal in size.Scattered subcortical and periventricular white matter hypodensities, nonspecific but likely sequela of chronic small vessel ischemic disease. Vascular: No hyperdense vessel or unexpected calcification. Skull: Negative for skull fracture.  No focal bone lesion. Sinuses/Orbits: No acute findings. Other: None. IMPRESSION: No acute intracranial abnormality. Unchanged mild sequela of chronic small vessel ischemic disease. Electronically Signed   By: Maurine Simmering M.D.   On: 04/27/2021 11:40   ? ?Scheduled Meds: ? feeding supplement  237 mL Oral BID BM  ? pantoprazole  40 mg Oral BID  ? ?Continuous Infusions: ? sodium chloride 75 mL/hr at 04/27/21 2109  ? ? LOS: 0 days  ? ?  Time spent: 57mn ? ?WLittle Ishikawa DO ?[facility] ? ?If 7PM-7AM, please contact night-coverage ?[email or web address] ? ?04/28/2021, 6:45 AM   ? ? ? ?

## 2021-04-29 DIAGNOSIS — R791 Abnormal coagulation profile: Secondary | ICD-10-CM | POA: Diagnosis not present

## 2021-04-29 LAB — BASIC METABOLIC PANEL WITH GFR
Anion gap: 9 (ref 5–15)
BUN: 17 mg/dL (ref 8–23)
CO2: 22 mmol/L (ref 22–32)
Calcium: 8.4 mg/dL — ABNORMAL LOW (ref 8.9–10.3)
Chloride: 96 mmol/L — ABNORMAL LOW (ref 98–111)
Creatinine, Ser: 0.9 mg/dL (ref 0.61–1.24)
GFR, Estimated: >60 mL/min
Glucose, Bld: 105 mg/dL — ABNORMAL HIGH (ref 70–99)
Potassium: 3.4 mmol/L — ABNORMAL LOW (ref 3.5–5.1)
Sodium: 127 mmol/L — ABNORMAL LOW (ref 135–145)

## 2021-04-29 LAB — CBC
HCT: 32.6 % — ABNORMAL LOW (ref 39.0–52.0)
Hemoglobin: 10.8 g/dL — ABNORMAL LOW (ref 13.0–17.0)
MCH: 32.1 pg (ref 26.0–34.0)
MCHC: 33.1 g/dL (ref 30.0–36.0)
MCV: 97 fL (ref 80.0–100.0)
Platelets: 190 10*3/uL (ref 150–400)
RBC: 3.36 MIL/uL — ABNORMAL LOW (ref 4.22–5.81)
RDW: 19.2 % — ABNORMAL HIGH (ref 11.5–15.5)
WBC: 7.1 10*3/uL (ref 4.0–10.5)
nRBC: 0 % (ref 0.0–0.2)

## 2021-04-29 LAB — PROTIME-INR
INR: 1.4 — ABNORMAL HIGH (ref 0.8–1.2)
Prothrombin Time: 17.5 seconds — ABNORMAL HIGH (ref 11.4–15.2)

## 2021-04-29 MED ORDER — WARFARIN - PHARMACIST DOSING INPATIENT: Freq: Every day | Status: DC

## 2021-04-29 MED ORDER — IBUPROFEN 400 MG PO TABS
400.0000 mg | ORAL_TABLET | Freq: Four times a day (QID) | ORAL | Status: DC | PRN
  Administered 2021-04-29 – 2021-04-30 (×2): 400 mg via ORAL
  Filled 2021-04-29 (×2): qty 1

## 2021-04-29 MED ORDER — KETOROLAC TROMETHAMINE 15 MG/ML IJ SOLN
15.0000 mg | Freq: Once | INTRAMUSCULAR | Status: AC
  Administered 2021-04-29: 15 mg via INTRAVENOUS
  Filled 2021-04-29: qty 1

## 2021-04-29 MED ORDER — WARFARIN SODIUM 5 MG PO TABS
5.0000 mg | ORAL_TABLET | Freq: Once | ORAL | Status: AC
  Administered 2021-04-29: 5 mg via ORAL
  Filled 2021-04-29: qty 1

## 2021-04-29 NOTE — Progress Notes (Signed)
Request sent to Attending, advocating of the patients request for something for the break through pain to pt left bruised left hip and sacral area. This LPN applied not heat to the area and changed the sacral foam now covering the area large bruised area.  ?Pt  and pt's niece who is at bedside states that he informed  PT yesterday when they walked him of the pain, but still was able to complete his walk. ?400 mg of Advil added to pt's MAR. Will continue to monitor  ? ?

## 2021-04-29 NOTE — Progress Notes (Signed)
?PROGRESS NOTE ? ? ? ?Danny [name].  OIZ:[id number] DOB: 11/18/1931 DOA: 04/27/2021 ?PCP: Loman Brooklyn, FNP ? ? ?Brief Narrative:  ?Danny [name]. is a 88 y.o. male with medical history significant for hypertension, atrial fibrillation, CABG. Patient went to his primary care provider's office today, with reports of confusion, INR was checked and it was greater than 8 he was referred to the ED. ? ?Assessment & Plan: ?  ?Principal Problem: ?  Supratherapeutic INR ?Active Problems: ?  Acute metabolic encephalopathy ?  Hyponatremia ?  Essential hypertension ?  Long term (current) use of anticoagulants [Z79.01] ?  Persistent atrial fibrillation ([place]) ? ?Supratherapeutic INR, resolving ?INR 11.2 at intake ?No signs or symptoms of bleeding, CT head negative ?Continue to hold warfarin, follow repeat INR (vitamin K given in the ED at intake). ?Lab Results  ?Component Value Date  ? INR 1.4 (H) 04/29/2021  ? INR 2.3 (H) 04/28/2021  ? INR 11.2 (HH) 04/27/2021  ?  ?Acute metabolic encephalopathy ?Rule out alcohol withdrawal ?Family indicates patient consumes beer "throughout the day" (unspecified quantity), no history of withdrawals, no signs or symptoms here of tremors but may explain patient's metabolic encephalopathy at intake. ?Hyponatremia as below acute on chronic, not profoundly abnormal from his baseline ?Concern for advancing dementia ?CT head unremarkable as above, without focal deficits ? ?Acute ambulatory dysfunction, multifactorial ?Likely secondary to acute metabolic encephalopathy and poor p.o. intake ?Increased falls over the past few weeks, remains high risk for bleeding given elevated INR ?Pending disposition to SNF ?  ?Hyponatremia, acute on chronic ?Likely hypovolemic in nature, resolved with IV fluids ?*Update from family - he drinks beer throughout the day - likely beer potomania ?Baseline sodium mid 120s-130 ?Urine studies pending  ?  ?Persistent atrial fibrillation ([place]) ?Currently rate  controlled, continue metoprolol ?INR downtrending as above, will need to follow-up with PCP versus cardiology for further discussion of warfarin usage ongoing in the setting of advanced age and multiple falls reported at intake. ?  ?Essential hypertension ?Continue metoprolol ?  ?DVT prophylaxis: SCDs ?Code Status: DNR ?Family Communication: Niece - Chrys Racer unable to be reached by phone. ? ?Status is: Inpatient ? ?Dispo: The patient is from: Home ?             Anticipated d/c is to: SNF ?             Anticipated d/c date is: 24 to 48 hours ?             Patient currently not medically stable for discharge, awaiting safe disposition at rehab ? ?Consultants:  ?None ? ?Procedures:  ?None ? ?Antimicrobials:  ?None ? ?Subjective: ?No acute issues or events overnight, patient more awake alert this morning, denies nausea vomiting diarrhea constipation headache fevers chills or chest pain. ? ?Objective: ?Vitals:  ? 04/28/21 0430 04/28/21 1500 04/28/21 2051 04/29/21 0541  ?BP: 115/65 113/76 124/82 118/66  ?Pulse: 96 90 64 65  ?Resp: '20 18 16 18  '$ ?Temp: 98 ?F (36.7 ?C) 99.7 ?F (37.6 ?C) 98.5 ?F (36.9 ?C) 98.5 ?F (36.9 ?C)  ?TempSrc:  Oral Oral Oral  ?SpO2: 98% 99% 98% 95%  ?Weight:      ?Height:      ? ?No intake or output data in the 24 hours ending 04/29/21 0716 ? ?Filed Weights  ? 04/27/21 0949  ?Weight: 75.1 kg  ? ? ?Examination: ? ?General:  Pleasantly resting in bed, No acute distress. ?HEENT:  Normocephalic atraumatic.  Sclerae nonicteric,  noninjected.  Extraocular movements intact bilaterally. ?Neck:  Without mass or deformity.  Trachea is midline. ?Lungs:  Clear to auscultate bilaterally without rhonchi, wheeze, or rales. ?Heart: Irregularly irregular.  Without murmurs, rubs, or gallops. ?Abdomen:  Soft, nontender, nondistended.  Without guarding or rebound. ?Extremities: Without cyanosis, clubbing, edema, or obvious deformity. ?Vascular:  Dorsalis pedis and posterior tibial pulses palpable bilaterally. ?Skin:  Warm  and dry, no erythema, no ulcerations. ? ?Data Reviewed: I have personally reviewed following labs and imaging studies ? ?CBC: ?Recent Labs  ?Lab 04/27/21 ?1117 04/28/21 ?8309 04/29/21 ?4076  ?WBC 8.2 6.2 7.1  ?NEUTROABS 7.1  --   --   ?HGB 12.3* 10.3* 10.8*  ?HCT 36.9* 30.0* 32.6*  ?MCV 96.3 95.5 97.0  ?PLT 199 202 190  ? ? ?Basic Metabolic Panel: ?Recent Labs  ?Lab 04/27/21 ?1117 04/27/21 ?1141 04/28/21 ?8088 04/29/21 ?1103  ?NA 122*  --  124* 127*  ?K 5.6* 3.8 3.4* 3.4*  ?CL 87*  --  94* 96*  ?CO2 22  --  21* 22  ?GLUCOSE 95  --  95 105*  ?BUN 20  --  20 17  ?CREATININE 0.87  --  0.95 0.90  ?CALCIUM 8.6*  --  8.0* 8.4*  ? ? ?GFR: ?Estimated Creatinine Clearance: 60.3 mL/min (by C-G formula based on SCr of 0.9 mg/dL). ?Liver Function Tests: ?Recent Labs  ?Lab 04/27/21 ?1117  ?AST 70*  ?ALT 23  ?ALKPHOS 120  ?BILITOT 1.7*  ?PROT 7.1  ?ALBUMIN 3.8  ? ? ?No results for input(s): LIPASE, AMYLASE in the last 168 hours. ?No results for input(s): AMMONIA in the last 168 hours. ?Coagulation Profile: ?Recent Labs  ?Lab 04/27/21 ?0809 04/27/21 ?1141 04/28/21 ?1594 04/29/21 ?5859  ?INR >8.0* 11.2* 2.3* 1.4*  ? ? ?Cardiac Enzymes: ?No results for input(s): CKTOTAL, CKMB, CKMBINDEX, TROPONINI in the last 168 hours. ?BNP (last 3 results) ?No results for input(s): PROBNP in the last 8760 hours. ?HbA1C: ?No results for input(s): HGBA1C in the last 72 hours. ?CBG: ?No results for input(s): GLUCAP in the last 168 hours. ?Lipid Profile: ?No results for input(s): CHOL, HDL, LDLCALC, TRIG, CHOLHDL, LDLDIRECT in the last 72 hours. ?Thyroid Function Tests: ?No results for input(s): TSH, T4TOTAL, FREET4, T3FREE, THYROIDAB in the last 72 hours. ?Anemia Panel: ?No results for input(s): VITAMINB12, FOLATE, FERRITIN, TIBC, IRON, RETICCTPCT in the last 72 hours. ?Sepsis Labs: ?No results for input(s): PROCALCITON, LATICACIDVEN in the last 168 hours. ? ?No results found for this or any previous visit (from the past 240 hour(s)).   ? ? ? ? ? ?Radiology Studies: ?CT Head Wo Contrast ? ?Result Date: 04/27/2021 ?CLINICAL DATA:  Mental status change, persistent or worsening supratherapeutic INR per PCP's notes EXAM: CT HEAD WITHOUT CONTRAST TECHNIQUE: Contiguous axial images were obtained from the base of the skull through the vertex without intravenous contrast. RADIATION DOSE REDUCTION: This exam was performed according to the departmental dose-optimization program which includes automated exposure control, adjustment of the mA and/or kV according to patient size and/or use of iterative reconstruction technique. COMPARISON:  Head CT 02/05/2021 FINDINGS: Brain: No evidence of acute intracranial hemorrhage or extra-axial collection.No evidence of mass lesion/concerning mass effect.The ventricles are normal in size.Scattered subcortical and periventricular white matter hypodensities, nonspecific but likely sequela of chronic small vessel ischemic disease. Vascular: No hyperdense vessel or unexpected calcification. Skull: Negative for skull fracture.  No focal bone lesion. Sinuses/Orbits: No acute findings. Other: None. IMPRESSION: No acute intracranial abnormality. Unchanged mild sequela of chronic small vessel  ischemic disease. Electronically Signed   By: Maurine Simmering M.D.   On: 04/27/2021 11:40   ? ?Scheduled Meds: ? feeding supplement  237 mL Oral BID BM  ? pantoprazole  40 mg Oral BID  ? ?Continuous Infusions: ? ? ? LOS: 1 day  ? ?Time spent: 13mn ? ?WLittle Ishikawa DO ?[facility] ? ?If 7PM-7AM, please contact night-coverage ?[email or web address] ? ?04/29/2021, 7:16 AM   ? ? ? ?

## 2021-04-29 NOTE — TOC Progression Note (Signed)
Transition of Care (TOC) - Progression Note  ? ? ?Patient Details  ?Name: Danny [name]. ?MRN: [id number] ?Date of Birth: 10/11/1931 ? ?Transition of Care (TOC) CM/SW Contact  ?Boneta Lucks, RN ?Phone Number: ?04/29/2021, 11:52 AM ? ?Clinical Narrative:   TOC spoke with wife, they are wanting [facility] for rehab. He is vaccinated. [facility] made a bed offer and will start insurance authorization. ? ? ?Expected Discharge Plan: [place] ?Barriers to Discharge: Ship broker, Continued Medical Work up ? ?Expected Discharge Plan and Services ?Expected Discharge Plan: [place] ? Readmission Risk Interventions ?   ? View : No data to display.  ?  ?  ?  ? ? ?

## 2021-04-29 NOTE — Progress Notes (Signed)
ANTICOAGULATION CONSULT NOTE - Initial Consult ? ?Pharmacy Consult for warfarin ?Indication: atrial fibrillation ? ? ?Vital Signs: ?Temp: 98.5 ?F (36.9 ?C) (04/06 0541) ?Temp Source: Oral (04/06 0541) ?BP: 118/66 (04/06 0541) ?Pulse Rate: 65 (04/06 0541) ? ?Labs: ?Recent Labs  ?  04/27/21 ?1117 04/27/21 ?1141 04/28/21 ?0263 04/29/21 ?7858  ?HGB 12.3*  --  10.3* 10.8*  ?HCT 36.9*  --  30.0* 32.6*  ?PLT 199  --  202 190  ?LABPROT  --  86.2* 24.7* 17.5*  ?INR  --  11.2* 2.3* 1.4*  ?CREATININE 0.87  --  0.95 0.90  ? ? ?Estimated Creatinine Clearance: 60.3 mL/min (by C-G formula based on SCr of 0.9 mg/dL). ? ? ?Medications:  ?Home warfarin: '5mg'$  daily ? ?Assessment: ?88 year old male hx Afib on chronic coumadin ('5mg'$  daily) admitted on 4/4 with supratherapeutic INR of 11.1. S/p '10mg'$  IV vitamin K, now consulted to resume anticoagulation. Will expect the effects of the vitamin K dose to still linger.  ? ?Goal of Therapy:  ?INR 2-3 ?Monitor platelets by anticoagulation protocol: Yes ?  ?Plan:  ?Warfarin '5mg'$  x 1 ?INR daily ? ?Lorenso Courier, PharmD ?Clinical Pharmacist ?04/29/2021 11:30 AM ? ? ? ? ? ? ? ?

## 2021-04-29 NOTE — Progress Notes (Signed)
Sent Attending message that the pt was requesting pain. Pt was given 650 mg of tylenol that he states "does nothing for the pain." Pt is now sitting on the side of the bed, and has ambulated in the room , and from the bed to the chair not finding any relief. Ice pack applied to aide in relief. Will continue to monitor ?No new orders at this time Attending suggested to have the patient sit in chair, was advised of the previous efforts. With no relief.Will continue to monitor pt .   ?

## 2021-04-29 NOTE — Care Management Important Message (Signed)
Important Message ? ?Patient Details  ?Name: Danny [name]. ?MRN: [id number] ?Date of Birth: 01-29-1931 ? ? ?Medicare Important Message Given:  N/A - LOS <3 / Initial given by admissions ? ? ? ? ?Tommy Medal ?04/29/2021, 11:19 AM ?

## 2021-04-30 DIAGNOSIS — I4891 Unspecified atrial fibrillation: Secondary | ICD-10-CM | POA: Diagnosis not present

## 2021-04-30 DIAGNOSIS — E871 Hypo-osmolality and hyponatremia: Secondary | ICD-10-CM | POA: Diagnosis not present

## 2021-04-30 DIAGNOSIS — I272 Pulmonary hypertension, unspecified: Secondary | ICD-10-CM | POA: Diagnosis not present

## 2021-04-30 DIAGNOSIS — R791 Abnormal coagulation profile: Secondary | ICD-10-CM | POA: Diagnosis not present

## 2021-04-30 DIAGNOSIS — I4819 Other persistent atrial fibrillation: Secondary | ICD-10-CM | POA: Diagnosis not present

## 2021-04-30 DIAGNOSIS — G9341 Metabolic encephalopathy: Secondary | ICD-10-CM | POA: Diagnosis not present

## 2021-04-30 DIAGNOSIS — I10 Essential (primary) hypertension: Secondary | ICD-10-CM | POA: Diagnosis not present

## 2021-04-30 DIAGNOSIS — E559 Vitamin D deficiency, unspecified: Secondary | ICD-10-CM | POA: Diagnosis not present

## 2021-04-30 DIAGNOSIS — K219 Gastro-esophageal reflux disease without esophagitis: Secondary | ICD-10-CM | POA: Diagnosis not present

## 2021-04-30 DIAGNOSIS — R41841 Cognitive communication deficit: Secondary | ICD-10-CM | POA: Diagnosis not present

## 2021-04-30 DIAGNOSIS — E785 Hyperlipidemia, unspecified: Secondary | ICD-10-CM | POA: Diagnosis not present

## 2021-04-30 DIAGNOSIS — D688 Other specified coagulation defects: Secondary | ICD-10-CM | POA: Diagnosis not present

## 2021-04-30 LAB — PROTIME-INR
INR: 1.4 — ABNORMAL HIGH (ref 0.8–1.2)
Prothrombin Time: 16.5 seconds — ABNORMAL HIGH (ref 11.4–15.2)

## 2021-04-30 MED ORDER — KETOROLAC TROMETHAMINE 15 MG/ML IJ SOLN
15.0000 mg | Freq: Once | INTRAMUSCULAR | Status: AC
  Administered 2021-04-30: 15 mg via INTRAVENOUS
  Filled 2021-04-30: qty 1

## 2021-04-30 NOTE — Progress Notes (Signed)
Pt restless and in pain throughout this writers shift. On call notified that pt had been alternating PRN Tylenol/Advil throughout prior shift and was still 10/10 on pain scale. One time dose Toradol IV administered per new order. Pt now resting comfortably. Will continue to monitor.  ?

## 2021-04-30 NOTE — Discharge Summary (Signed)
Physician Discharge Summary  ?Danny [name]. PPJ:[id number] DOB: 1931-04-25 DOA: 04/27/2021 ? ?PCP: Loman Brooklyn, FNP ? ?Admit date: 04/27/2021 ?Discharge date: 04/30/2021 ? ?Admitted From: Home ?Disposition: SNF ? ?Recommendations for Outpatient Follow-up:  ?Follow up with PCP in 1-2 weeks as scheduled ? ?Discharge Condition: Stable ?CODE STATUS: DNR ?Diet recommendation: As tolerated ? ?Brief/Interim Summary: ?Danny [name]. is a 88 y.o. male with medical history significant for hypertension, atrial fibrillation, CABG. Patient went to his primary care provider's office today, with reports of confusion, INR was checked and it was greater than 8 he was referred to the ED. ?  ?Assessment & Plan: ?  ?Principal Problem: ?  Supratherapeutic INR ?Active Problems: ?  Acute metabolic encephalopathy ?  Hyponatremia ?  Essential hypertension ?  Long term (current) use of anticoagulants [Z79.01] ?  Persistent atrial fibrillation ([place]) ?  ?Supratherapeutic INR, resolving ?INR 11.2 at intake ?No signs or symptoms of bleeding, CT head negative ?INR elevation resolved, resume warfarin -we will likely need ongoing close management with PCP over the next few weeks ?  ?Acute metabolic encephalopathy, resolved ?Rule out alcohol withdrawal, unlikely ?No history of withdrawals, no signs or symptoms here of tremors but may explain patient's metabolic encephalopathy at intake. ?Hyponatremia as below acute on chronic, not profoundly abnormal from his baseline ?Concern for advancing dementia ?CT head unremarkable as above, without focal deficits ?  ?Acute ambulatory dysfunction, multifactorial ?Left hip pain, resolved ?PT following recommending SNF placement ?No limited range of motion no deficits on exam, left posterior hip pain secondary to previous fall ?Increased falls over the past few weeks, remains high risk for bleeding given elevated INR ?  ?Hyponatremia, acute on chronic ?Resolving, at baseline ?Likely hypovolemic in nature,  improving with IV fluids ?Update from family - he drinks beer throughout the day - likely complicated by beer potomania ?Baseline sodium mid 120s-130 ?  ?Persistent atrial fibrillation ([place]) ?Currently rate controlled, continue metoprolol ?INR downtrending as above, will need to follow-up with PCP versus cardiology for further discussion of warfarin usage ongoing in the setting of advanced age and multiple falls reported at intake. ?  ?Essential hypertension ?Continue metoprolol ?  ? ?Discharge Diagnoses:  ?Principal Problem: ?  Supratherapeutic INR ?Active Problems: ?  Acute metabolic encephalopathy ?  Hyponatremia ?  Essential hypertension ?  Long term (current) use of anticoagulants [Z79.01] ?  Persistent atrial fibrillation (Danny [name]) ? ? ? ?Discharge Instructions ? ?Discharge Instructions   ? ? Discharge patient   Complete by: As directed ?  ? Discharge disposition: 03-Skilled Nursing Facility  ? Discharge patient date: 04/30/2021  ? ?  ? ?Allergies as of 04/30/2021   ? ?   Reactions  ? Doxycycline Diarrhea  ? Contrast Media [iodinated Contrast Media]   ? Syncope  ? Lunesta [eszopiclone]   ? "felt WILD"  ? Morphine And Related Nausea And Vomiting  ? Trazodone And Nefazodone Other (See Comments)  ? Extremely drowsy the next day after taking.  ? ?  ? ?  ?Medication List  ?  ? ?STOP taking these medications   ? ?furosemide 40 MG tablet ?Commonly known as: LASIX ?  ?metoprolol succinate 50 MG 24 hr tablet ?Commonly known as: TOPROL-XL ?  ?nitroGLYCERIN 0.4 MG SL tablet ?Commonly known as: Nitrostat ?  ?spironolactone 25 MG tablet ?Commonly known as: ALDACTONE ?  ? ?  ? ?TAKE these medications   ? ?cholecalciferol 25 MCG (1000 UNIT) tablet ?Commonly known as: VITAMIN D ?Take 2,000  Units by mouth daily with breakfast. ?  ?Ensure ?Take 1 Can by mouth as needed. ?  ?Fish Oil 1200 MG Caps ?Take 2,400 mg by mouth daily. ?  ?levocetirizine 5 MG tablet ?Commonly known as: XYZAL ?Take 1 tablet (5 mg total) by mouth every evening. ?   ?lovastatin 40 MG tablet ?Commonly known as: MEVACOR ?TAKE 1 TABLET AT BEDTIME ?  ?omeprazole 20 MG capsule ?Commonly known as: PRILOSEC ?TAKE 1 CAPSULE TWICE DAILY BEFORE MEALS ?  ?Crane PO ?Take 1 capsule by mouth daily as needed. ?  ?potassium chloride SA 20 MEQ tablet ?Commonly known as: KLOR-CON M ?Take 2 tablets (40 mEq total) by mouth daily. ?  ?PRESERVISION AREDS 2 PO ?Take 1 tablet by mouth in the morning and at bedtime. ?  ?warfarin 5 MG tablet ?Commonly known as: COUMADIN ?Take as directed. If you are unsure how to take this medication, talk to your nurse or doctor. ?Original instructions: TAKE 1 TABLET EVERY DAY ?  ? ?  ? ? Contact information for after-discharge care   ? ? Destination   ? ? [facility] .   ?Service: Skilled Nursing ?Contact information: ?[address] ?[place] ?[phone] ? ?  ?  ? ?  ?  ? ?  ?  ? ?  ? ?Allergies  ?Allergen Reactions  ? Doxycycline Diarrhea  ? Contrast Media [Iodinated Contrast Media]   ?  Syncope  ? Lunesta [Eszopiclone]   ?  "felt WILD"  ? Morphine And Related Nausea And Vomiting  ? Trazodone And Nefazodone Other (See Comments)  ?  Extremely drowsy the next day after taking.  ? ? ?Consultations: ?None ? ?Procedures/Studies: ?CT Head Wo Contrast ? ?Result Date: 04/27/2021 ?CLINICAL DATA:  Mental status change, persistent or worsening supratherapeutic INR per PCP's notes EXAM: CT HEAD WITHOUT CONTRAST TECHNIQUE: Contiguous axial images were obtained from the base of the skull through the vertex without intravenous contrast. RADIATION DOSE REDUCTION: This exam was performed according to the departmental dose-optimization program which includes automated exposure control, adjustment of the mA and/or kV according to patient size and/or use of iterative reconstruction technique. COMPARISON:  Head CT 02/05/2021 FINDINGS: Brain: No evidence of acute intracranial hemorrhage or extra-axial collection.No  evidence of mass lesion/concerning mass effect.The ventricles are normal in size.Scattered subcortical and periventricular white matter hypodensities, nonspecific but likely sequela of chronic small vessel ischemic disease. Vascular: No hyperdense vessel or unexpected calcification. Skull: Negative for skull fracture.  No focal bone lesion. Sinuses/Orbits: No acute findings. Other: None. IMPRESSION: No acute intracranial abnormality. Unchanged mild sequela of chronic small vessel ischemic disease. Electronically Signed   By: Maurine Simmering M.D.   On: 04/27/2021 11:40   ? ? ?Subjective: No acute issues or events overnight ? ? ?Discharge Exam: ?Vitals:  ? 04/30/21 0424 04/30/21 1358  ?BP: 129/77 120/65  ?Pulse: 97 95  ?Resp: 15 18  ?Temp: 97.9 ?F (36.6 ?C) 98 ?F (36.7 ?C)  ?SpO2: 97% 98%  ? ?Vitals:  ? 04/29/21 1220 04/29/21 2030 04/30/21 0424 04/30/21 1358  ?BP: 124/68 131/82 129/77 120/65  ?Pulse: 91 83 97 95  ?Resp: '18 18 15 18  '$ ?Temp: 97.6 ?F (36.4 ?C) 98.2 ?F (36.8 ?C) 97.9 ?F (36.6 ?C) 98 ?F (36.7 ?C)  ?TempSrc: Oral Oral  Oral  ?SpO2:  100% 97% 98%  ?Weight:      ?Height:      ? ?General: Pt is alert, awake, not in acute  distress ?Cardiovascular: RRR, S1/S2 +, no rubs, no gallops ?Respiratory: CTA bilaterally, no wheezing, no rhonchi ?Abdominal: Soft, NT, ND, bowel sounds + ?Extremities: no edema, no cyanosis ? ?The results of significant diagnostics from this hospitalization (including imaging, microbiology, ancillary and laboratory) are listed below for reference.   ? ? ?Microbiology: ?No results found for this or any previous visit (from the past 240 hour(s)).  ? ?Labs: ?BNP (last 3 results) ?Recent Labs  ?  02/06/21 ?0903 02/09/21 ?0406 02/16/21 ?1103  ?BNP 491.0* 675.0* 374.4*  ? ?Basic Metabolic Panel: ?Recent Labs  ?Lab 04/27/21 ?1117 04/27/21 ?1141 04/28/21 ?6222 04/29/21 ?9798  ?NA 122*  --  124* 127*  ?K 5.6* 3.8 3.4* 3.4*  ?CL 87*  --  94* 96*  ?CO2 22  --  21* 22  ?GLUCOSE 95  --  95 105*  ?BUN 20  --   20 17  ?CREATININE 0.87  --  0.95 0.90  ?CALCIUM 8.6*  --  8.0* 8.4*  ? ?Liver Function Tests: ?Recent Labs  ?Lab 04/27/21 ?1117  ?AST 70*  ?ALT 23  ?ALKPHOS 120  ?BILITOT 1.7*  ?PROT 7.1  ?ALBUMIN 3.8

## 2021-04-30 NOTE — Progress Notes (Signed)
?PROGRESS NOTE ? ? ? ?Danny [name].  MOL:[id number] DOB: 1931/06/14 DOA: 04/27/2021 ?PCP: Loman Brooklyn, FNP ? ? ?Brief Narrative:  ?Danny [name]. is a 88 y.o. male with medical history significant for hypertension, atrial fibrillation, CABG. Patient went to his primary care provider's office today, with reports of confusion, INR was checked and it was greater than 8 he was referred to the ED. ? ?Assessment & Plan: ?  ?Principal Problem: ?  Supratherapeutic INR ?Active Problems: ?  Acute metabolic encephalopathy ?  Hyponatremia ?  Essential hypertension ?  Long term (current) use of anticoagulants [Z79.01] ?  Persistent atrial fibrillation ([place]) ? ?Supratherapeutic INR, resolving ?INR 11.2 at intake ?No signs or symptoms of bleeding, CT head negative ?Continue to hold warfarin, follow repeat INR (vitamin K given in the ED at intake). ?Lab Results  ?Component Value Date  ? INR 1.4 (H) 04/30/2021  ? INR 1.4 (H) 04/29/2021  ? INR 2.3 (H) 04/28/2021  ?  ?Acute metabolic encephalopathy, resolved ?Rule out alcohol withdrawal, unlikely ?Family indicates patient consumes beer "throughout the day" (unspecified quantity), no history of withdrawals, no signs or symptoms here of tremors but may explain patient's metabolic encephalopathy at intake. ?Hyponatremia as below acute on chronic, not profoundly abnormal from his baseline ?Concern for advancing dementia ?CT head unremarkable as above, without focal deficits ? ?Acute ambulatory dysfunction, multifactorial ?Left hip pain, resolved ?PT following recommending SNF placement ?No limited range of motion no deficits on exam, left posterior hip pain secondary to previous fall ?Increased falls over the past few weeks, remains high risk for bleeding given elevated INR ?  ?Hyponatremia, acute on chronic ?Resolving, at baseline ?Likely hypovolemic in nature, improving with IV fluids ?Update from family - he drinks beer throughout the day - likely complicated by beer  potomania ?Baseline sodium mid 120s-130 ?  ?Persistent atrial fibrillation ([place]) ?Currently rate controlled, continue metoprolol ?INR downtrending as above, will need to follow-up with PCP versus cardiology for further discussion of warfarin usage ongoing in the setting of advanced age and multiple falls reported at intake. ?  ?Essential hypertension ?Continue metoprolol ?  ?DVT prophylaxis: SCDs ?Code Status: DNR ?Family Communication: Niece - Danny [name] unable to be reached by phone. ? ?Status is: Inpatient ? ?Dispo: The patient is from: Home ?             Anticipated d/c is to: SNF ?             Anticipated d/c date is: 24 to 48 hours ?             Patient currently not medically stable for discharge, awaiting safe disposition at rehab ? ?Consultants:  ?None ? ?Procedures:  ?None ? ?Antimicrobials:  ?None ? ?Subjective: ?No acute issues or events overnight, patient more awake alert this morning, denies nausea vomiting diarrhea constipation headache fevers chills or chest pain. ? ?Objective: ?Vitals:  ? 04/29/21 0541 04/29/21 1220 04/29/21 2030 04/30/21 0424  ?BP: 118/66 124/68 131/82 129/77  ?Pulse: 65 91 83 97  ?Resp: '18 18 18 15  '$ ?Temp: 98.5 ?F (36.9 ?C) 97.6 ?F (36.4 ?C) 98.2 ?F (36.8 ?C) 97.9 ?F (36.6 ?C)  ?TempSrc: Oral Oral Oral   ?SpO2: 95%  100% 97%  ?Weight:      ?Height:      ? ? ?Intake/Output Summary (Last 24 hours) at 04/30/2021 1014 ?Last data filed at 04/30/2021 0900 ?Gross per 24 hour  ?Intake 720 ml  ?Output --  ?Net 720  ml  ? ?Filed Weights  ? 04/27/21 0949  ?Weight: 75.1 kg  ? ? ?Examination: ? ?General:  Pleasantly resting in bed, No acute distress. ?HEENT:  Normocephalic atraumatic.  Sclerae nonicteric, noninjected.  Extraocular movements intact bilaterally. ?Neck:  Without mass or deformity.  Trachea is midline. ?Lungs:  Clear to auscultate bilaterally without rhonchi, wheeze, or rales. ?Heart: Irregularly irregular.  Without murmurs, rubs, or gallops. ?Abdomen:  Soft, nontender, nondistended.   Without guarding or rebound. ?Extremities: Without cyanosis, clubbing, edema, or obvious deformity. ?Vascular:  Dorsalis pedis and posterior tibial pulses palpable bilaterally. ?Skin:  Warm and dry, no erythema, no ulcerations. ? ?Data Reviewed: I have personally reviewed following labs and imaging studies ? ?CBC: ?Recent Labs  ?Lab 04/27/21 ?1117 04/28/21 ?2440 04/29/21 ?1027  ?WBC 8.2 6.2 7.1  ?NEUTROABS 7.1  --   --   ?HGB 12.3* 10.3* 10.8*  ?HCT 36.9* 30.0* 32.6*  ?MCV 96.3 95.5 97.0  ?PLT 199 202 190  ? ?Basic Metabolic Panel: ?Recent Labs  ?Lab 04/27/21 ?1117 04/27/21 ?1141 04/28/21 ?2536 04/29/21 ?6440  ?NA 122*  --  124* 127*  ?K 5.6* 3.8 3.4* 3.4*  ?CL 87*  --  94* 96*  ?CO2 22  --  21* 22  ?GLUCOSE 95  --  95 105*  ?BUN 20  --  20 17  ?CREATININE 0.87  --  0.95 0.90  ?CALCIUM 8.6*  --  8.0* 8.4*  ? ?GFR: ?Estimated Creatinine Clearance: 60.3 mL/min (by C-G formula based on SCr of 0.9 mg/dL). ?Liver Function Tests: ?Recent Labs  ?Lab 04/27/21 ?1117  ?AST 70*  ?ALT 23  ?ALKPHOS 120  ?BILITOT 1.7*  ?PROT 7.1  ?ALBUMIN 3.8  ? ?No results for input(s): LIPASE, AMYLASE in the last 168 hours. ?No results for input(s): AMMONIA in the last 168 hours. ?Coagulation Profile: ?Recent Labs  ?Lab 04/27/21 ?0809 04/27/21 ?1141 04/28/21 ?3474 04/29/21 ?2595 04/30/21 ?0456  ?INR >8.0* 11.2* 2.3* 1.4* 1.4*  ? ?Cardiac Enzymes: ?No results for input(s): CKTOTAL, CKMB, CKMBINDEX, TROPONINI in the last 168 hours. ?BNP (last 3 results) ?No results for input(s): PROBNP in the last 8760 hours. ?HbA1C: ?No results for input(s): HGBA1C in the last 72 hours. ?CBG: ?No results for input(s): GLUCAP in the last 168 hours. ?Lipid Profile: ?No results for input(s): CHOL, HDL, LDLCALC, TRIG, CHOLHDL, LDLDIRECT in the last 72 hours. ?Thyroid Function Tests: ?No results for input(s): TSH, T4TOTAL, FREET4, T3FREE, THYROIDAB in the last 72 hours. ?Anemia Panel: ?No results for input(s): VITAMINB12, FOLATE, FERRITIN, TIBC, IRON, RETICCTPCT in  the last 72 hours. ?Sepsis Labs: ?No results for input(s): PROCALCITON, LATICACIDVEN in the last 168 hours. ? ?No results found for this or any previous visit (from the past 240 hour(s)).  ? ? ? ? ? ?Radiology Studies: ?No results found. ? ?Scheduled Meds: ? feeding supplement  237 mL Oral BID BM  ? pantoprazole  40 mg Oral BID  ? Warfarin - Pharmacist Dosing Inpatient   Does not apply [id number]  ? ?Continuous Infusions: ? ? ? LOS: 2 days  ? ?Time spent: 65mn ? ?WLittle Ishikawa DO ?[facility] ? ?If 7PM-7AM, please contact night-coverage ?[email or web address] ? ?04/30/2021, 10:14 AM   ? ? ? ?

## 2021-04-30 NOTE — TOC Transition Note (Signed)
Transition of Care (TOC) - CM/SW Discharge Note ? ? ?Patient Details  ?Name: Danny [name]. ?MRN: [id number] ?Date of Birth: [birth year] ? ?Transition of Care (TOC) CM/SW Contact:  ?Boneta Lucks, RN ?Phone Number: ?04/30/2021, 2:36 PM ? ? ?Clinical Narrative:   DC summary sent to [facility] [place] received. RN to call report. TOC called wife to updated.  ?Final next level of care: [place] ?Barriers to Discharge: Barriers Resolved ? ? ?Patient Goals and CMS Choice ?Patient states their goals for this hospitalization and ongoing recovery are:: to go to SNF ?CMS Medicare.gov Compare Post Acute Care list provided to:: Patient Represenative (must comment) ?Choice offered to / list presented to : Spouse ? ?Discharge Placement ?           ?  ?Patient to be transferred to facility by: [facility] Staff ?Name of family member notified: wife ?Patient and family notified of of transfer: 04/30/21 ? ?Discharge Plan and Services ? [place] ? ? ?Readmission Risk Interventions ? ?  04/30/2021  ?  2:35 PM  ?Readmission Risk Prevention Plan  ?Post Dischage Appt Complete  ?Medication Screening Complete  ?Transportation Screening Complete  ? ? ? ? ? ?

## 2021-04-30 NOTE — Progress Notes (Signed)
ANTICOAGULATION CONSULT NOTE - Initial Consult ? ?Pharmacy Consult for warfarin ?Indication: atrial fibrillation ? ? ?Vital Signs: ?Temp: 97.9 ?F (36.6 ?C) (04/07 0424) ?Temp Source: Oral (04/06 2030) ?BP: 129/77 (04/07 0424) ?Pulse Rate: 97 (04/07 0424) ? ?Labs: ?Recent Labs  ?  04/27/21 ?1117 04/27/21 ?1141 04/28/21 ?9758 04/29/21 ?8325 04/30/21 ?0456  ?HGB 12.3*  --  10.3* 10.8*  --   ?HCT 36.9*  --  30.0* 32.6*  --   ?PLT 199  --  202 190  --   ?LABPROT  --    < > 24.7* 17.5* 16.5*  ?INR  --    < > 2.3* 1.4* 1.4*  ?CREATININE 0.87  --  0.95 0.90  --   ? < > = values in this interval not displayed.  ? ? ? ?Estimated Creatinine Clearance: 60.3 mL/min (by C-G formula based on SCr of 0.9 mg/dL). ? ? ?Medications:  ?Home warfarin: '5mg'$  daily ? ?Assessment: ?88 year old male hx Afib on chronic coumadin ('5mg'$  daily) admitted on 4/4 with supratherapeutic INR of 11.1. S/p '10mg'$  IV vitamin K, now consulted to resume anticoagulation. Will expect the effects of the vitamin K dose to still linger.  ? ?INR 1.4, subtherapeutic  ? ?Goal of Therapy:  ?INR 2-3 ?Monitor platelets by anticoagulation protocol: Yes ?  ?Plan:  ?Warfarin '5mg'$  x 1 ?INR daily ?CBC q48h ? ?Thomasenia Sales, PharmD, MBA ?Clinical Pharmacist ? ?04/30/2021 7:55 AM ? ? ? ? ? ? ? ?

## 2021-05-03 ENCOUNTER — Non-Acute Institutional Stay (SKILLED_NURSING_FACILITY): Payer: Medicare HMO | Admitting: Internal Medicine

## 2021-05-03 ENCOUNTER — Encounter: Payer: Self-pay | Admitting: Internal Medicine

## 2021-05-03 ENCOUNTER — Other Ambulatory Visit: Payer: Self-pay | Admitting: Family Medicine

## 2021-05-03 ENCOUNTER — Other Ambulatory Visit (HOSPITAL_COMMUNITY)
Admission: RE | Admit: 2021-05-03 | Discharge: 2021-05-03 | Disposition: A | Discharge status: Home / Self Care | Payer: Medicare HMO | Source: Skilled Nursing Facility | Attending: Internal Medicine | Admitting: Internal Medicine

## 2021-05-03 DIAGNOSIS — E871 Hypo-osmolality and hyponatremia: Secondary | ICD-10-CM | POA: Diagnosis not present

## 2021-05-03 DIAGNOSIS — R791 Abnormal coagulation profile: Secondary | ICD-10-CM | POA: Insufficient documentation

## 2021-05-03 DIAGNOSIS — I480 Paroxysmal atrial fibrillation: Secondary | ICD-10-CM

## 2021-05-03 DIAGNOSIS — I4819 Other persistent atrial fibrillation: Secondary | ICD-10-CM

## 2021-05-03 DIAGNOSIS — I10 Essential (primary) hypertension: Secondary | ICD-10-CM

## 2021-05-03 DIAGNOSIS — G9341 Metabolic encephalopathy: Secondary | ICD-10-CM | POA: Diagnosis not present

## 2021-05-03 DIAGNOSIS — Z7901 Long term (current) use of anticoagulants: Secondary | ICD-10-CM

## 2021-05-03 LAB — PROTIME-INR
INR: 1.4 — ABNORMAL HIGH (ref 0.8–1.2)
Prothrombin Time: 16.7 seconds — ABNORMAL HIGH (ref 11.4–15.2)

## 2021-05-03 NOTE — Assessment & Plan Note (Addendum)
PT/INR 1.4; warfarin increased to 7.5 mg today with INR in am. Warfarin being taken in am; this will be changed to after 5 pm each day to allow appropriate dose adjustment to help prevent recurrence of supratherapeutic INR . ?

## 2021-05-03 NOTE — Assessment & Plan Note (Addendum)
BP 145/91 & P 104. Clarify any active beta blocker order  & adjust dose appropriately. Epic states Metoprolol D/Ced @ discharge ?

## 2021-05-03 NOTE — Patient Instructions (Signed)
See assessment and plan under each diagnosis in the problem list and acutely for this visit 

## 2021-05-03 NOTE — Assessment & Plan Note (Signed)
Today gave date as " April 5, 02". Unaware of reason for hospitalization ?TSH & B12 WNL; RPR (VDRL ) will be performed ?

## 2021-05-03 NOTE — Progress Notes (Signed)
? ?NURSING HOME LOCATION: [place] ?ROOM NUMBER:  159 P ? ?CODE STATUS:  DNR ? ?PCP:  [name] FNP ? ?This is a comprehensive admission note to this SNFperformed on this date less than 30 days from date of admission. ?Included are preadmission medical/surgical history; reconciled medication list; family history; social history and comprehensive review of systems.  ?Corrections and additions to the records were documented. Comprehensive physical exam was also performed. Additionally a clinical summary was entered for each active diagnosis pertinent to this admission in the Problem List to enhance continuity of care. ? ?HPI: He was hospitalized 4/4 - 04/30/2021 with confusion and supranormal INR.  INR was 11.2 in the ED; no bleeding dyscrasias were documented.  CT of the head was negative.  B12 had been checked 1/14 and was supranormal at 1008.  TSH was also current as of 2/16 and was therapeutic at 1.62.  No VDR or RPR was performed. ?Alcohol withdrawal as etiology of the acute metabolic encephalopathy was questioned but not documented.  Hyponatremia was present which was felt to be a chronic issue.  Sodium ranged from 122 up to 127 as inpatient.  Creatinine was 0.9 with a GFR greater than 60 indicating CKD stage II.  There was some progression of anemia; H/H was 12.3/36.9 at presentation but dropped to 10.8/32.6.  Again no bleeding dyscrasias were noted despite the supratherapeutic INR. ?Persistent A-fib was present; rate was adequately controlled and beta-blockers were continued. ?PT/OT recommended SNF placement for rehab. ? ?Past medical and surgical history: Includes history of coronary artery disease, history of C. difficile enteritis, GERD, dyslipidemia, essential hypertension, internal carotid artery stenosis, vitamin D deficiency, and history of squamous cell carcinoma of LUE. ?Surgeries and procedures include carotid endarterectomy, carotid-subclavian bypass graft, colonoscopies,  CABG, esophageal dilation, and skin cancer excision. ? ?Social history: Nondrinker at present; former decade long smoker, quitting in 1963. ? ?Family history: Noncontributory due to advanced age. ?  ?Review of systems: Clinical neurocognitive deficits made validity of responses questionable,preventing adequate ROS completion. Date given as "April 28, 2000.". ?He did not realize he had had a supranormal PT/INR.  He stated that he had had fallen in the yard and had gone to the hospital only to visit his niece "and they kept me".  He confabulated about stepping backwards on a slope and falling onto his buttocks and rolling onto his shoulder.  He stated "they ran me through the mill".  His only complaint at this time that he is "sluggish, lying around & want to go home."  Apparently he thinks he is still in the hospital rather than the SNF. ? ?Constitutional: No fever, significant weight change  ?Eyes: No redness, discharge, pain, vision change ?ENT/mouth: No nasal congestion, purulent discharge, earache, change in hearing, sore throat  ?Cardiovascular: No chest pain, palpitations, paroxysmal nocturnal dyspnea, claudication, edema  ?Respiratory: No cough, sputum production, hemoptysis, DOE, significant snoring, apnea Gastrointestinal: No heartburn, dysphagia, abdominal pain, nausea /vomiting, rectal bleeding, melena, change in bowels ?Genitourinary: No dysuria, hematuria, pyuria, incontinence, nocturia ?Musculoskeletal: No joint stiffness, joint swelling, weakness, pain ?Dermatologic: No rash, pruritus, change in appearance of skin ?Neurologic: No dizziness, headache, syncope, seizures, numbness, tingling ?Psychiatric: No significant anxiety, depression, insomnia, anorexia ?Endocrine: No change in hair/skin/nails, excessive thirst, excessive hunger, excessive urination  ?Hematologic/lymphatic: No significant bruising, lymphadenopathy, abnormal bleeding ?Allergy/immunology: No itchy/watery eyes, significant sneezing,  urticaria, angioedema ? ?Physical exam:  ?Pertinent or positive findings: Slight ptosis present on the right.  Eyebrow density is decreased  laterally.  Lacrimal glands are prominent.  He is Geneticist, molecular.  He has upper and lower partials.  Breath sounds are decreased.  He has a grade 1.5 systolic murmur at the base and rhythm is irregular.  Pedal pulses are decreased.  Right upper extremity is stronger to opposition than other extremities.  Strength to opposition of the lower extremities is fair.  He ambulates with a walker and gait is unsteady.  He has keratotic changes of his hands. ? ?General appearance: no acute distress, increased work of breathing is present.   ?Lymphatic: No lymphadenopathy about the head, neck, axilla. ?Eyes: No conjunctival inflammation or lid edema is present. There is no scleral icterus. ?Ears:  External ear exam shows no significant lesions or deformities.   ?Nose:  External nasal examination shows no deformity or inflammation. Nasal mucosa are pink and moist without lesions, exudates ?Oral exam: Lips and gums are healthy appearing.There is no oropharyngeal erythema or exudate. ?Neck:  No thyromegaly, masses, tenderness noted.    ?Heart:  No gallop, click, rub.  ?Lungs: without wheezes, rhonchi, rales, rubs. ?Abdomen: Bowel sounds are normal.  Abdomen is soft and nontender with no organomegaly, hernias, masses. ?GU: Deferred  ?Extremities:  No cyanosis, clubbing, edema. ?Neurologic exam:  Balance, Rhomberg, finger to nose testing could not be completed due to clinical state ?Skin: Warm & dry w/o tenting. ?No significant rash. ? ?See clinical summary under each active problem in the Problem List with associated updated therapeutic plan ? ?

## 2021-05-03 NOTE — Assessment & Plan Note (Addendum)
Rate slightly fast; adjust beta blocker dose & monitor rate & BP ?

## 2021-05-04 ENCOUNTER — Encounter: Payer: Self-pay | Admitting: Adult Health

## 2021-05-04 ENCOUNTER — Encounter (HOSPITAL_COMMUNITY)
Admission: RE | Admit: 2021-05-04 | Discharge: 2021-05-04 | Disposition: A | Discharge status: Home / Self Care | Payer: Medicare HMO | Source: Skilled Nursing Facility | Attending: Internal Medicine | Admitting: Internal Medicine

## 2021-05-04 ENCOUNTER — Non-Acute Institutional Stay (SKILLED_NURSING_FACILITY): Payer: Medicare HMO | Admitting: Adult Health

## 2021-05-04 DIAGNOSIS — I4891 Unspecified atrial fibrillation: Secondary | ICD-10-CM

## 2021-05-04 DIAGNOSIS — Z7901 Long term (current) use of anticoagulants: Secondary | ICD-10-CM | POA: Insufficient documentation

## 2021-05-04 LAB — PROTIME-INR
INR: 1.3 — ABNORMAL HIGH (ref 0.8–1.2)
Prothrombin Time: 16 seconds — ABNORMAL HIGH (ref 11.4–15.2)

## 2021-05-04 LAB — RPR: RPR Ser Ql: NONREACTIVE

## 2021-05-04 NOTE — Assessment & Plan Note (Signed)
Consider SIADH evaluation if progressive. ?

## 2021-05-04 NOTE — Progress Notes (Signed)
?Location:  [place] ?Nursing Home Room Number: 426S ?Place of Service:  SNF (31) ? ? ?CODE STATUS: DNR ? ?Allergies  ?Allergen Reactions  ? Doxycycline Diarrhea  ? Contrast Media [Iodinated Contrast Media]   ?  Syncope  ? Lunesta [Eszopiclone]   ?  "felt WILD"  ? Morphine And Related Nausea And Vomiting  ? Trazodone And Nefazodone Other (See Comments)  ?  Extremely drowsy the next day after taking.  ? ?Chief Complaint  ?Patient presents with  ? Acute Visit  ?  INR management   ? ? ? ?HPI: ? ?He is presently taking coumadin therapy for atrial fibrillation. He has a history of falls; his age is advanced at 70. This medication is high risk for bleeding. His heart rate is stable on beta blocker. I have spoken with both him and his wife; they are in agreement to change him to asa.  ? ?Past Medical History:  ?Diagnosis Date  ? Anxiety   ? Atrial fibrillation ([place])   ? CAD, ARTERY BYPASS GRAFT 05/19/2008  ? Qualifier: Diagnosis of  By: Mare Ferrari, [place], [place]    ? Enteritis due to Clostridium difficile, history of 11/21/2012  ? GERD (gastroesophageal reflux disease)   ? Hyperlipidemia   ? Hypertension   ? Internal carotid artery stenosis   ? Shingles 06/2020  ? 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 Vitamin D deficiency   ? ? ?Past Surgical History:  ?Procedure Laterality Date  ? CARDIOVERSION N/A 05/05/2016  ? Procedure: CARDIOVERSION;  Surgeon: Josue Hector, MD;  Location: [facility];  Service: Cardiovascular;  Laterality: N/A;  ? CAROTID ENDARTERECTOMY Left 2003  ? CAROTID-SUBCLAVIAN BYPASS GRAFT Right   ? COLONOSCOPY  2005  ? hyperplastic polyp x 1 with no adenoma  ? COLONOSCOPY N/A 04/11/2014  ? TMH:DQQI diverticulosis in the sigmoid colon/left colon is redundant  ? CORONARY ARTERY BYPASS GRAFT    ? ESOPHAGEAL DILATION N/A 04/11/2014  ? Procedure: ESOPHAGEAL DILATION;  Surgeon: [name], MD;  [facility];  Service: Endoscopy;  Laterality: N/A;  ?  ESOPHAGOGASTRODUODENOSCOPY N/A 04/11/2014  ? [id number] at the gastro junction/moderate erosive/duodenal web  ? EYE SURGERY Bilateral   ? cataracts  ? RIGHT/LEFT HEART CATH AND CORONARY/GRAFT ANGIOGRAPHY N/A 02/11/2020  ? Procedure: RIGHT/LEFT HEART CATH AND CORONARY/GRAFT ANGIOGRAPHY;  Surgeon: Martinique, Peter M, MD;  Location: [facility];  Service: Cardiovascular;  Laterality: N/A;  ? SKIN CANCER EXCISION    ? ? ?Social History  ? ?Socioeconomic History  ? Marital status: Married  ?  Spouse name: ruby  ? Number of children: 1  ? Years of education: Not on file  ? Highest education level: Not on file  ?Occupational History  ? Occupation: retired  ?  Employer: [employer]  ?Tobacco Use  ? Smoking status: Former  ?  Types: Cigarettes  ?  Start date: 01/24/1950  ?  Quit date: 01/24/1961  ?  Years since quitting: 60.3  ? Smokeless tobacco: Never  ?Vaping Use  ? Vaping Use: Never used  ?Substance and Sexual Activity  ? Alcohol use: Not Currently  ?  Alcohol/week: 0.0 standard drinks  ?  Comment: not drank since 12/2009  ? Drug use: No  ? Sexual activity: Yes  ?  Partners: Female  ?Other Topics Concern  ? Not on file  ?Social History Narrative  ? Not on file  ? ?Social Determinants of Health  ? ?Financial Resource Strain: Not  on file  ?Food Insecurity: Not on file  ?Transportation Needs: Not on file  ?Physical Activity: Not on file  ?Stress: Not on file  ?Social Connections: Not on file  ?Intimate Partner Violence: Not on file  ? ?Family History  ?Problem Relation Age of Onset  ? Heart disease Mother   ? Stroke Mother   ? Stroke Father   ? Diabetes Son   ? Stroke Son   ? Other Sister   ?     ruptured appendix   ? Lung cancer Brother   ?     asbestos  ? Mental illness Sister   ? Kidney disease Sister   ? Colon cancer Neg Hx   ?     "not that I know of."  ? ? ? ? ?VITAL SIGNS ?BP (!) 145/91   Pulse (!) 104   Temp (!) 97.1 ?F (36.2 ?C)   Resp 20   Ht '6\' 2"'$  (1.88 m)   Wt 163 lb 9.6 oz (74.2 kg)   BMI 21.00 kg/m?   ? ?Outpatient Encounter Medications as of 05/04/2021  ?Medication Sig  ? acetaminophen (TYLENOL) 325 MG tablet Take 650 mg by mouth every 6 (six) hours as needed.  ? cholecalciferol (VITAMIN D) 25 MCG (1000 UNIT) tablet Take 2,000 Units by mouth daily with breakfast.  ? Ensure (ENSURE) Take 1 Can by mouth as needed.  ? levocetirizine (XYZAL) 5 MG tablet Take 1 tablet (5 mg total) by mouth every evening.  ? lovastatin (MEVACOR) 40 MG tablet TAKE 1 TABLET AT BEDTIME  ? metoprolol succinate (TOPROL-XL) 50 MG 24 hr tablet Take 50 mg by mouth daily. Take with or immediately following a meal.  ? Multiple Vitamins-Minerals (PRESERVISION AREDS 2 PO) Take 1 tablet by mouth in the morning and at bedtime.  ? omeprazole (PRILOSEC) 20 MG capsule TAKE 1 CAPSULE TWICE DAILY BEFORE MEALS  ? potassium chloride SA (KLOR-CON M) 20 MEQ tablet Take 2 tablets (40 mEq total) by mouth daily.  ? Probiotic Product (PHILLIPS COLON HEALTH PO) Take 1 capsule by mouth daily as needed.  ? warfarin (COUMADIN) 5 MG tablet TAKE 1 TABLET EVERY DAY  ? [DISCONTINUED] Omega-3 Fatty Acids (FISH OIL) 1200 MG CAPS Take 2,400 mg by mouth daily.  ? [DISCONTINUED] warfarin (COUMADIN) 5 MG tablet TAKE 1 TABLET EVERY DAY  ? ?No facility-administered encounter medications on file as of 05/04/2021.  ? ? ? ?SIGNIFICANT DIAGNOSTIC EXAMS ? ?LABS REVIEWED;  ? ?04-29-21: wbc 7.1; hgb 10.8; hct 32.6; mcv 97.0 plt 190; glucose 105; bun 17; creat 0.90; k+ 3.4; na++ 127; ca 8.4; GFR >60 ?05-04-21: INR 1.3; RPR nr ? ?Review of Systems  ?Constitutional:  Negative for malaise/fatigue.  ?Respiratory:  Negative for cough and shortness of breath.   ?Cardiovascular:  Negative for chest pain, palpitations and leg swelling.  ?Gastrointestinal:  Negative for abdominal pain, constipation and heartburn.  ?Musculoskeletal:  Negative for back pain, joint pain and myalgias.  ?Skin: Negative.   ?Neurological:  Negative for dizziness.  ?Psychiatric/Behavioral:  The patient is not  nervous/anxious.   ? ?Physical Exam ?Constitutional:   ?   General: He is not in acute distress. ?   Appearance: He is well-developed. He is not diaphoretic.  ?Neck:  ?   Thyroid: No thyromegaly.  ?Cardiovascular:  ?   Rate and Rhythm: Normal rate and regular rhythm.  ?   Pulses: Normal pulses.  ?   Heart sounds: Normal heart sounds.  ?Pulmonary:  ?   Effort:  Pulmonary effort is normal. No respiratory distress.  ?   Breath sounds: Normal breath sounds.  ?Abdominal:  ?   General: Bowel sounds are normal. There is no distension.  ?   Palpations: Abdomen is soft.  ?   Tenderness: There is no abdominal tenderness.  ?Musculoskeletal:     ?   General: Normal range of motion.  ?   Cervical back: Neck supple.  ?Lymphadenopathy:  ?   Cervical: No cervical adenopathy.  ?Skin: ?   General: Skin is warm and dry.  ?Neurological:  ?   Mental Status: He is alert. Mental status is at baseline.  ?Psychiatric:     ?   Mood and Affect: Mood normal.  ? ? ? ?ASSESSMENT/ PLAN: ? ?TODAY ? ?Atrial fibrillation with RVR: will stop coumadin therapy; and will begin asa 81 mg daily will monitor  ? ? ?Ok Edwards NP ?[facility]  ? call [phone]   ?

## 2021-05-13 ENCOUNTER — Other Ambulatory Visit (HOSPITAL_COMMUNITY)
Admission: RE | Admit: 2021-05-13 | Discharge: 2021-05-13 | Disposition: A | Discharge status: Home / Self Care | Payer: Medicare HMO | Source: Skilled Nursing Facility | Attending: Adult Health | Admitting: Adult Health

## 2021-05-13 DIAGNOSIS — I10 Essential (primary) hypertension: Secondary | ICD-10-CM | POA: Insufficient documentation

## 2021-05-13 LAB — BASIC METABOLIC PANEL
Anion gap: 7 (ref 5–15)
BUN: 17 mg/dL (ref 8–23)
CO2: 24 mmol/L (ref 22–32)
Calcium: 8.6 mg/dL — ABNORMAL LOW (ref 8.9–10.3)
Chloride: 102 mmol/L (ref 98–111)
Creatinine, Ser: 0.76 mg/dL (ref 0.61–1.24)
GFR, Estimated: >60 mL/min (ref >60)
Glucose, Bld: 81 mg/dL (ref 70–99)
Potassium: 3.8 mmol/L (ref 3.5–5.1)
Sodium: 133 mmol/L — ABNORMAL LOW (ref 135–145)

## 2021-05-13 LAB — CBC
HCT: 37.7 % — ABNORMAL LOW (ref 39.0–52.0)
Hemoglobin: 12.3 g/dL — ABNORMAL LOW (ref 13.0–17.0)
MCH: 31.8 pg (ref 26.0–34.0)
MCHC: 32.6 g/dL (ref 30.0–36.0)
MCV: 97.4 fL (ref 80.0–100.0)
Platelets: 240 10*3/uL (ref 150–400)
RBC: 3.87 MIL/uL — ABNORMAL LOW (ref 4.22–5.81)
RDW: 18.4 % — ABNORMAL HIGH (ref 11.5–15.5)
WBC: 4.9 10*3/uL (ref 4.0–10.5)
nRBC: 0 % (ref 0.0–0.2)

## 2021-05-14 ENCOUNTER — Other Ambulatory Visit: Payer: Self-pay | Admitting: Adult Health

## 2021-05-14 ENCOUNTER — Telehealth: Payer: Self-pay | Admitting: Family Medicine

## 2021-05-14 ENCOUNTER — Non-Acute Institutional Stay (SKILLED_NURSING_FACILITY): Payer: Medicare HMO | Admitting: Adult Health

## 2021-05-14 ENCOUNTER — Encounter: Payer: Self-pay | Admitting: Adult Health

## 2021-05-14 DIAGNOSIS — I4819 Other persistent atrial fibrillation: Secondary | ICD-10-CM

## 2021-05-14 DIAGNOSIS — E7800 Pure hypercholesterolemia, unspecified: Secondary | ICD-10-CM

## 2021-05-14 DIAGNOSIS — I272 Pulmonary hypertension, unspecified: Secondary | ICD-10-CM | POA: Diagnosis not present

## 2021-05-14 DIAGNOSIS — I10 Essential (primary) hypertension: Secondary | ICD-10-CM | POA: Diagnosis not present

## 2021-05-14 DIAGNOSIS — I700 Atherosclerosis of aorta: Secondary | ICD-10-CM

## 2021-05-14 MED ORDER — LOVASTATIN 40 MG PO TABS: 40.0000 mg | ORAL_TABLET | Freq: Every day | ORAL | 0 refills | Status: DC

## 2021-05-14 MED ORDER — METOPROLOL SUCCINATE ER 50 MG PO TB24: 50.0000 mg | ORAL_TABLET | Freq: Every day | ORAL | 0 refills | Status: DC

## 2021-05-14 MED ORDER — POTASSIUM CHLORIDE CRYS ER 20 MEQ PO TBCR: 40.0000 meq | EXTENDED_RELEASE_TABLET | Freq: Every day | ORAL | 0 refills | Status: DC

## 2021-05-14 NOTE — Progress Notes (Signed)
? ?Location:  [facility] ?Nursing Home Room Number: 159-P ?Place of Service:  SNF (31) ? ? ?CODE STATUS: dnr  ? ?Allergies  ?Allergen Reactions  ? Doxycycline Diarrhea  ? Contrast Media [Iodinated Contrast Media]   ?  Syncope  ? Lunesta [Eszopiclone]   ?  "felt WILD"  ? Morphine And Related Nausea And Vomiting  ? Trazodone And Nefazodone Other (See Comments)  ?  Extremely drowsy the next day after taking.  ? ? ?Chief Complaint  ?Patient presents with  ? Acute Visit  ?  Care plan meeting  ? ? ?HPI: ?  ?We have come together for his care planning meeting. Family present. BIMS 11/15 mood 4/30: decreased energy some depression.  He requires limited assist with adls. Occasional incontinence of bladder continent of bowel. No falls. Dietary: . Therapy: bed mobility is independent stand/pivot is supervision; ambulate 60 feet only;up stairs with bilateral railings with supervision upper and lower body supervision; toilet supervision; standing tolerance is 2 minutes.is on speech therapy for cognition BCAT 22/50.  Shower transfers min assist . He continues to be followed for his chronic illnesses including:  Essential hypertension  Pulmonary HTN  Persistent atrial fibrillation  ?He has decided to go home today. Will need home health for pt/ot/st/cna. Has a transport chair at home.  ? ?Past Medical History:  ?Diagnosis Date  ? Anxiety   ? Atrial fibrillation ([place])   ? CAD, ARTERY BYPASS GRAFT 05/19/2008  ? Qualifier: Diagnosis of  By: Mare Ferrari, [place], [place]    ? Enteritis due to Clostridium difficile, history of 11/21/2012  ? GERD (gastroesophageal reflux disease)   ? Hyperlipidemia   ? Hypertension   ? Internal carotid artery stenosis   ? Shingles 06/2020  ? 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 Vitamin D deficiency   ? ? ?Past Surgical History:  ?Procedure Laterality Date  ? CARDIOVERSION N/A 05/05/2016  ? Procedure: CARDIOVERSION;  Surgeon: Josue Hector, MD;  Location: [facility];   Service: Cardiovascular;  Laterality: N/A;  ? CAROTID ENDARTERECTOMY Left 2003  ? CAROTID-SUBCLAVIAN BYPASS GRAFT Right   ? COLONOSCOPY  2005  ? hyperplastic polyp x 1 with no adenoma  ? COLONOSCOPY N/A 04/11/2014  ? [id number] diverticulosis in the sigmoid colon/left colon is redundant  ? CORONARY ARTERY BYPASS GRAFT    ? ESOPHAGEAL DILATION N/A 04/11/2014  ? Procedure: ESOPHAGEAL DILATION;  Surgeon: [name], MD;  [facility];  Service: Endoscopy;  Laterality: N/A;  ? ESOPHAGOGASTRODUODENOSCOPY N/A 04/11/2014  ? [id number] at the gastro junction/moderate erosive/duodenal web  ? EYE SURGERY Bilateral   ? cataracts  ? RIGHT/LEFT HEART CATH AND CORONARY/GRAFT ANGIOGRAPHY N/A 02/11/2020  ? Procedure: RIGHT/LEFT HEART CATH AND CORONARY/GRAFT ANGIOGRAPHY;  Surgeon: Martinique, Peter M, MD;  Location: [facility];  Service: Cardiovascular;  Laterality: N/A;  ? SKIN CANCER EXCISION    ? ? ?Social History  ? ?Socioeconomic History  ? Marital status: Married  ?  Spouse name: ruby  ? Number of children: 1  ? Years of education: Not on file  ? Highest education level: Not on file  ?Occupational History  ? Occupation: retired  ?  Employer: [employer]  ?Tobacco Use  ? Smoking status: Former  ?  Types: Cigarettes  ?  Start date: 01/24/1950  ?  Quit date: 01/24/1961  ?  Years since quitting: 60.3  ? Smokeless tobacco: Never  ?Vaping Use  ? Vaping Use: Never used  ?Substance  and Sexual Activity  ? Alcohol use: Not Currently  ?  Alcohol/week: 0.0 standard drinks  ?  Comment: not drank since 12/2009  ? Drug use: No  ? Sexual activity: Yes  ?  Partners: Female  ?Other Topics Concern  ? Not on file  ?Social History Narrative  ? Not on file  ? ?Social Determinants of Health  ? ?Financial Resource Strain: Not on file  ?Food Insecurity: Not on file  ?Transportation Needs: Not on file  ?Physical Activity: Not on file  ?Stress: Not on file  ?Social Connections: Not on file  ?Intimate Partner Violence: Not on file  ? ?Family  History  ?Problem Relation Age of Onset  ? Heart disease Mother   ? Stroke Mother   ? Stroke Father   ? Diabetes Son   ? Stroke Son   ? Other Sister   ?     ruptured appendix   ? Lung cancer Brother   ?     asbestos  ? Mental illness Sister   ? Kidney disease Sister   ? Colon cancer Neg Hx   ?     "not that I know of."  ? ? ? ? ?VITAL SIGNS ?BP 129/77   Pulse 60   Temp 97.6 ?F (36.4 ?C)   Resp 20   Ht '6\' 2"'$  (1.88 m)   Wt 172 lb (78 kg)   SpO2 96%   BMI 22.08 kg/m?  ? ?Outpatient Encounter Medications as of 05/14/2021  ?Medication Sig  ? acetaminophen (TYLENOL) 325 MG tablet Take 650 mg by mouth every 6 (six) hours as needed.  ? cholecalciferol (VITAMIN D) 25 MCG (1000 UNIT) tablet Take 2,000 Units by mouth daily with breakfast.  ? Ensure (ENSURE) Take 1 Can by mouth as needed.  ? levocetirizine (XYZAL) 5 MG tablet Take 1 tablet (5 mg total) by mouth every evening.  ? lovastatin (MEVACOR) 40 MG tablet TAKE 1 TABLET AT BEDTIME  ? metoprolol succinate (TOPROL-XL) 50 MG 24 hr tablet Take 50 mg by mouth daily. Take with or immediately following a meal.  ? Multiple Vitamins-Minerals (PRESERVISION AREDS 2 PO) Take 1 tablet by mouth in the morning and at bedtime.  ? omeprazole (PRILOSEC) 20 MG capsule TAKE 1 CAPSULE TWICE DAILY BEFORE MEALS  ? potassium chloride SA (KLOR-CON M) 20 MEQ tablet Take 2 tablets (40 mEq total) by mouth daily.  ? Probiotic Product (PHILLIPS COLON HEALTH PO) Take 1 capsule by mouth daily as needed.  ? warfarin (COUMADIN) 5 MG tablet TAKE 1 TABLET EVERY DAY  ? ?No facility-administered encounter medications on file as of 05/14/2021.  ? ? ? ?SIGNIFICANT DIAGNOSTIC EXAMS ? ? ?LABS REVIEWED; PREVIOUS  ? ?04-29-21: wbc 7.1; hgb 10.8; hct 32.6; mcv 97.0 plt 190; glucose 105; bun 17; creat 0.90; k+ 3.4; na++ 127; ca 8.4; GFR >60 ?05-04-21: INR 1.3; RPR nr ? ?NO NEW LABS.  ? ?Review of Systems  ?Constitutional:  Negative for malaise/fatigue.  ?Respiratory:  Negative for cough and shortness of breath.    ?Cardiovascular:  Negative for chest pain, palpitations and leg swelling.  ?Gastrointestinal:  Negative for abdominal pain, constipation and heartburn.  ?Musculoskeletal:  Negative for back pain, joint pain and myalgias.  ?Skin: Negative.   ?Neurological:  Negative for dizziness.  ?Psychiatric/Behavioral:  The patient is not nervous/anxious.   ? ?Physical Exam ?Constitutional:   ?   General: He is not in acute distress. ?   Appearance: He is well-developed. He is not diaphoretic.  ?  Neck:  ?   Thyroid: No thyromegaly.  ?Cardiovascular:  ?   Rate and Rhythm: Normal rate and regular rhythm.  ?   Pulses: Normal pulses.  ?   Heart sounds: Normal heart sounds.  ?Pulmonary:  ?   Effort: Pulmonary effort is normal. No respiratory distress.  ?   Breath sounds: Normal breath sounds.  ?Abdominal:  ?   General: Bowel sounds are normal. There is no distension.  ?   Palpations: Abdomen is soft.  ?   Tenderness: There is no abdominal tenderness.  ?Musculoskeletal:     ?   General: Normal range of motion.  ?   Cervical back: Neck supple.  ?Lymphadenopathy:  ?   Cervical: No cervical adenopathy.  ?Skin: ?   General: Skin is warm and dry.  ?Neurological:  ?   Mental Status: He is alert. Mental status is at baseline.  ?Psychiatric:     ?   Mood and Affect: Mood normal.  ? ? ? ?ASSESSMENT/ PLAN: ? ?TODAY ? ?Essential hypertension ?Pulmonary HTN ?Persistent atrial fibrillation  ? ?Will continue current medications ?Will continue current plan of care ?Will continue therapy as directed ?Goal of care: has a transport chair at home; pt/ot/st/cna; to evaluate and treat as indicated for gait balance strength adl training and adl care.  ? ?A 30 day  supply of his prescription medications have been sent to [facility]  ? ?Time spent with patient 40 minutes: medication plan of care therapy.  ? ? ?Ok Edwards NP ?[facility]  ? call [phone]  ? ?

## 2021-05-14 NOTE — Telephone Encounter (Signed)
I will make sure we discussed some of these things at his appointment. ?

## 2021-05-14 NOTE — Telephone Encounter (Signed)
Patient is refusing to stay in a nursing facility and will be going home. His wife does not want him to stay either. They wanted to let us know that his Dementia Scale was a 4 and that his BCAT was a 22 out of 50. They state that he has severe dementia. They have advised patient not to drive but he tole them the last time he drove he didn't kill anybody. [name] will be the home health agency helping them. The nursing facility is really worried about his safety and wanted you to be aware of what was going on. Patient was given an appointment on Tuesday April 25th with you.  ?

## 2021-05-14 NOTE — Progress Notes (Signed)
?Location:  [facility] ?Nursing Home Room Number: 159-P ?Place of Service:  SNF (31) ? ? ?CODE STATUS: DNR ? ?Allergies  ?Allergen Reactions  ? Doxycycline Diarrhea  ? Contrast Media [Iodinated Contrast Media]   ?  Syncope  ? Lunesta [Eszopiclone]   ?  "felt WILD"  ? Morphine And Related Nausea And Vomiting  ? Trazodone And Nefazodone Other (See Comments)  ?  Extremely drowsy the next day after taking.  ? ? ?Chief Complaint  ?Patient presents with  ? Acute Visit  ?  Care plan meeting  ? ? ?HPI: ? ? ? ?Past Medical History:  ?Diagnosis Date  ? Anxiety   ? Atrial fibrillation ([place])   ? CAD, ARTERY BYPASS GRAFT 05/19/2008  ? Qualifier: Diagnosis of  By: Mare Ferrari, [place], [place]    ? Enteritis due to Clostridium difficile, history of 11/21/2012  ? GERD (gastroesophageal reflux disease)   ? Hyperlipidemia   ? Hypertension   ? Internal carotid artery stenosis   ? Shingles 06/2020  ? 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 Vitamin D deficiency   ? ? ?Past Surgical History:  ?Procedure Laterality Date  ? CARDIOVERSION N/A 05/05/2016  ? Procedure: CARDIOVERSION;  Surgeon: Josue Hector, MD;  Location: [facility];  Service: Cardiovascular;  Laterality: N/A;  ? CAROTID ENDARTERECTOMY Left 2003  ? CAROTID-SUBCLAVIAN BYPASS GRAFT Right   ? COLONOSCOPY  2005  ? hyperplastic polyp x 1 with no adenoma  ? COLONOSCOPY N/A 04/11/2014  ? KDX:IPJA diverticulosis in the sigmoid colon/left colon is redundant  ? CORONARY ARTERY BYPASS GRAFT    ? ESOPHAGEAL DILATION N/A 04/11/2014  ? Procedure: ESOPHAGEAL DILATION;  Surgeon: [name], MD;  [facility];  Service: Endoscopy;  Laterality: N/A;  ? ESOPHAGOGASTRODUODENOSCOPY N/A 04/11/2014  ? [id number] at the gastro junction/moderate erosive/duodenal web  ? EYE SURGERY Bilateral   ? cataracts  ? RIGHT/LEFT HEART CATH AND CORONARY/GRAFT ANGIOGRAPHY N/A 02/11/2020  ? Procedure: RIGHT/LEFT HEART CATH AND CORONARY/GRAFT ANGIOGRAPHY;  Surgeon: Martinique,  Peter M, MD;  Location: [facility];  Service: Cardiovascular;  Laterality: N/A;  ? SKIN CANCER EXCISION    ? ? ?Social History  ? ?Socioeconomic History  ? Marital status: Married  ?  Spouse name: ruby  ? Number of children: 1  ? Years of education: Not on file  ? Highest education level: Not on file  ?Occupational History  ? Occupation: retired  ?  Employer: [employer]  ?Tobacco Use  ? Smoking status: Former  ?  Types: Cigarettes  ?  Start date: 01/24/1950  ?  Quit date: 01/24/1961  ?  Years since quitting: 60.3  ? Smokeless tobacco: Never  ?Vaping Use  ? Vaping Use: Never used  ?Substance and Sexual Activity  ? Alcohol use: Not Currently  ?  Alcohol/week: 0.0 standard drinks  ?  Comment: not drank since 12/2009  ? Drug use: No  ? Sexual activity: Yes  ?  Partners: Female  ?Other Topics Concern  ? Not on file  ?Social History Narrative  ? Not on file  ? ?Social Determinants of Health  ? ?Financial Resource Strain: Not on file  ?Food Insecurity: Not on file  ?Transportation Needs: Not on file  ?Physical Activity: Not on file  ?Stress: Not on file  ?Social Connections: Not on file  ?Intimate Partner Violence: Not on file  ? ?Family History  ?Problem Relation Age of Onset  ? Heart disease Mother   ?  Stroke Mother   ? Stroke Father   ? Diabetes Son   ? Stroke Son   ? Other Sister   ?     ruptured appendix   ? Lung cancer Brother   ?     asbestos  ? Mental illness Sister   ? Kidney disease Sister   ? Colon cancer Neg Hx   ?     "not that I know of."  ? ? ? ? ?VITAL SIGNS ?BP 129/77   Pulse 60   Temp 97.6 ?F (36.4 ?C)   Resp 20   Ht '6\' 2"'$  (1.88 m)   Wt 172 lb (78 kg)   SpO2 96%   BMI 22.08 kg/m?  ? ?Outpatient Encounter Medications as of 05/14/2021  ?Medication Sig  ? acetaminophen (TYLENOL) 325 MG tablet Take 650 mg by mouth every 6 (six) hours as needed.  ? aspirin 81 MG chewable tablet Chew 81 mg by mouth daily. atrial fibrillation  ? cholecalciferol (VITAMIN D) 25 MCG (1000 UNIT) tablet Take 1,000 Units by mouth  daily.  ? Ensure (ENSURE) Take 1 Can by mouth as needed.  ? levocetirizine (XYZAL) 5 MG tablet Take 1 tablet (5 mg total) by mouth every evening.  ? lovastatin (MEVACOR) 40 MG tablet TAKE 1 TABLET AT BEDTIME  ? metoprolol succinate (TOPROL-XL) 50 MG 24 hr tablet Take 50 mg by mouth daily. Take with or immediately following a meal.  ? Multiple Vitamins-Minerals (PRESERVISION AREDS 2 PO) Take 1 tablet by mouth in the morning and at bedtime.  ? NON FORMULARY Diet - regular  ? omeprazole (PRILOSEC) 20 MG capsule TAKE 1 CAPSULE TWICE DAILY BEFORE MEALS  ? potassium chloride SA (KLOR-CON M) 20 MEQ tablet Take 2 tablets (40 mEq total) by mouth daily.  ? Probiotic Product (RISA-BID PROBIOTIC) TABS Take by mouth. 1 billion cell- 250 mg; oral Once A Day - PRN ?Special Instructions: abdominal discomfort  ? warfarin (COUMADIN) 5 MG tablet TAKE 1 TABLET EVERY DAY (Patient not taking: Reported on 05/14/2021)  ? [DISCONTINUED] Probiotic Product (PHILLIPS COLON HEALTH PO) Take 1 capsule by mouth daily as needed.  ? ?No facility-administered encounter medications on file as of 05/14/2021.  ? ? ? ?SIGNIFICANT DIAGNOSTIC EXAMS ? ? ? ? ? ? ?ASSESSMENT/ PLAN: ? ? ? ? ?Ok Edwards NP ?[facility]  ?Contact [phone] Monday through Friday 8am- 5pm  ?After hours call [phone]  ? ?

## 2021-05-17 ENCOUNTER — Telehealth: Payer: Self-pay | Admitting: Family Medicine

## 2021-05-18 ENCOUNTER — Ambulatory Visit (INDEPENDENT_AMBULATORY_CARE_PROVIDER_SITE_OTHER): Payer: Medicare HMO | Admitting: Family Medicine

## 2021-05-18 ENCOUNTER — Encounter: Payer: Self-pay | Admitting: Family Medicine

## 2021-05-18 VITALS — BP 143/82 | HR 93 | Temp 97.3°F

## 2021-05-18 DIAGNOSIS — I480 Paroxysmal atrial fibrillation: Secondary | ICD-10-CM | POA: Diagnosis not present

## 2021-05-18 DIAGNOSIS — B370 Candidal stomatitis: Secondary | ICD-10-CM | POA: Diagnosis not present

## 2021-05-18 DIAGNOSIS — E871 Hypo-osmolality and hyponatremia: Secondary | ICD-10-CM

## 2021-05-18 DIAGNOSIS — F0390 Unspecified dementia without behavioral disturbance: Secondary | ICD-10-CM

## 2021-05-18 DIAGNOSIS — R600 Localized edema: Secondary | ICD-10-CM | POA: Diagnosis not present

## 2021-05-18 DIAGNOSIS — R2681 Unsteadiness on feet: Secondary | ICD-10-CM

## 2021-05-18 DIAGNOSIS — I10 Essential (primary) hypertension: Secondary | ICD-10-CM | POA: Diagnosis not present

## 2021-05-18 DIAGNOSIS — F03A0 Unspecified dementia, mild, without behavioral disturbance, psychotic disturbance, mood disturbance, and anxiety: Secondary | ICD-10-CM

## 2021-05-18 DIAGNOSIS — R296 Repeated falls: Secondary | ICD-10-CM

## 2021-05-18 MED ORDER — DONEPEZIL HCL 5 MG PO TBDP: 5.0000 mg | ORAL_TABLET | Freq: Every day | ORAL | 2 refills | Status: DC

## 2021-05-18 MED ORDER — FLUCONAZOLE 150 MG PO TABS: 150.0000 mg | ORAL_TABLET | Freq: Every day | ORAL | 0 refills | Status: AC

## 2021-05-18 MED ORDER — FUROSEMIDE 20 MG PO TABS: 20.0000 mg | ORAL_TABLET | Freq: Every day | ORAL | 1 refills | Status: DC

## 2021-05-18 NOTE — Telephone Encounter (Signed)
B37.0; Diflucan 150 mg PO QD x7 days. ?

## 2021-05-18 NOTE — Telephone Encounter (Signed)
Aware and verbalizes understanding.  

## 2021-05-18 NOTE — Progress Notes (Signed)
? ?Assessment & Plan:  ?1-3. Paroxysmal atrial fibrillation (HCC)/Recurrent falls/Unsteady gait ?HAS-BLED: 8.9%, CHA2DS2-VASc: 4.8%. After discussing risks versus benefits of staying on warfarin, patient and his wife agree it would be best if he no longer took warfarin and only took aspirin.  ? ?4. Hyponatremia ?Labs to reassess. Wife to continue to limit alcohol intake.  ?- CMP14+EGFR ? ?5. Essential hypertension ?Well controlled on current regimen.  ?- CBC with Differential/Platelet ?- CMP14+EGFR ? ?6. Edema of both lower extremities ?Uncontrolled. Resumed furosemide which was stopped in the hospital. ?- furosemide (LASIX) 20 MG tablet; Take 1 tablet (20 mg total) by mouth daily.  Dispense: 90 tablet; Refill: 1 ? ?7. Dementia, old age [facility]) ?Started Aricept.  ?- donepezil (ARICEPT ODT) 5 MG disintegrating tablet; Take 1 tablet (5 mg total) by mouth at bedtime.  Dispense: 30 tablet; Refill: 2 ? ?8. Oral candidiasis ?- fluconazole (DIFLUCAN) 150 MG tablet; Take 1 tablet (150 mg total) by mouth daily for 7 days.  Dispense: 7 tablet; Refill: 0 ? ? ?Return in about 6 weeks (around 06/29/2021) for follow-up of chronic medication conditions. ? ?[name], MSN, APRN, FNP-C ?[place] ? ?Subjective:  ? ? Patient ID: Danny [name]., male    DOB: [birth year], 88 y.o.   MRN: [id number] ? ?Patient Care Team: ?Loman Brooklyn, FNP as PCP - General (Family Medicine) ?Josue Hector, MD as PCP - Cardiology (Cardiology) ?Josue Hector, MD as Attending Physician (Cardiology) ?Fields, Marga Melnick, MD (Inactive) as Consulting Physician (Gastroenterology) ?Okey Regal, OD (Optometry) ?Rexene Alberts, MD (Inactive) as Consulting Physician (Cardiothoracic Surgery) ?Allyn Kenner, MD as Consulting Physician (Dermatology)  ? ?Chief Complaint:  ?Chief Complaint  ?Patient presents with  ? nursing home follow up  ?  [facility]  ? [facility]  ?  Noticed yesterday   ? ? ?HPI: ?Danny [name]. is a 88 y.o.  male presenting on 05/18/2021 for nursing home follow up [facility] center) and [facility] (Noticed yesterday ) ? ?Patient was admitted to [facility] on 05/03/2021 after being admitted to [facility] 04/27/2021-04/30/2021.  He was initially sent from our office due to a supratherapeutic INR with confusion, weakness, unsteady gait, and report of multiple falls. His INR returned to normal. His wife reports she has hid the [facility] powder from him as she believes he was taking it excessively and not remembering due to his memory issues. During his hospitalization he had acute metabolic encephalopathy and hyponatremia questioned to be from alcoholism. His wife reports today he had been drinking a lot recently, starting at lunch time and not stopping until bed time. Unfortunately, again the patient does not recall this. She has been limiting the amount of alcohol he has since he has been home. The patient was discharged from [facility] on 05/14/2021 with orders for home health PT/OT/ST/CNA after he and his wife decided he should no longer be there. ST has been out and noticed thrush on his tongue. In addition, he was advised to avoid dry foods as he is having trouble swallowing. PT is scheduled to come out this weekend.  ? ? ?New complaints: ?None ? ? ?Social history: ? ?Relevant past medical, surgical, family and social history reviewed and updated as indicated. Interim medical history since our last visit reviewed. ? ?Allergies and medications reviewed and updated. ? ?DATA REVIEWED: CHART IN EPIC ? ?ROS: Negative unless specifically indicated above in HPI.  ? ? ?Current Outpatient Medications:  ?  acetaminophen (TYLENOL) 325 MG tablet, Take 650 mg by mouth every 6 (six) hours as needed., Disp: , Rfl:  ?  aspirin 81 MG chewable tablet, Chew 81 mg by mouth daily. atrial fibrillation, Disp: , Rfl:  ?  cholecalciferol (VITAMIN D) 25 MCG (1000 UNIT) tablet, Take 1,000 Units by mouth daily., Disp: , Rfl:  ?  Ensure  (ENSURE), Take 1 Can by mouth as needed., Disp: , Rfl:  ?  levocetirizine (XYZAL) 5 MG tablet, Take 1 tablet (5 mg total) by mouth every evening., Disp: 90 tablet, Rfl: 1 ?  lovastatin (MEVACOR) 40 MG tablet, Take 1 tablet (40 mg total) by mouth at bedtime., Disp: 30 tablet, Rfl: 0 ?  metoprolol succinate (TOPROL-XL) 50 MG 24 hr tablet, Take 1 tablet (50 mg total) by mouth daily. Take with or immediately following a meal., Disp: 30 tablet, Rfl: 0 ?  Multiple Vitamins-Minerals (PRESERVISION AREDS 2 PO), Take 1 tablet by mouth in the morning and at bedtime., Disp: , Rfl:  ?  NON FORMULARY, Diet - regular, Disp: , Rfl:  ?  omeprazole (PRILOSEC) 20 MG capsule, TAKE 1 CAPSULE TWICE DAILY BEFORE MEALS (Patient not taking: Reported on 05/18/2021), Disp: 180 capsule, Rfl: 0 ?  potassium chloride SA (KLOR-CON M) 20 MEQ tablet, Take 2 tablets (40 mEq total) by mouth daily., Disp: 60 tablet, Rfl: 0 ?  Probiotic Product (RISA-BID PROBIOTIC) TABS, Take by mouth. 1 billion cell- 250 mg; oral Once A Day - PRN Special Instructions: abdominal discomfort, Disp: , Rfl:   ? ?Allergies  ?Allergen Reactions  ? Doxycycline Diarrhea  ? Contrast Media [Iodinated Contrast Media]   ?  Syncope  ? Lunesta [Eszopiclone]   ?  "felt WILD"  ? Morphine And Related Nausea And Vomiting  ? Trazodone And Nefazodone Other (See Comments)  ?  Extremely drowsy the next day after taking.  ? ?Past Medical History:  ?Diagnosis Date  ? Anxiety   ? Atrial fibrillation ([place])   ? CAD, ARTERY BYPASS GRAFT 05/19/2008  ? Qualifier: Diagnosis of  By: Mare Ferrari, [place], [place]    ? Enteritis due to Clostridium difficile, history of 11/21/2012  ? GERD (gastroesophageal reflux disease)   ? Hyperlipidemia   ? Hypertension   ? Internal carotid artery stenosis   ? Shingles 06/2020  ? 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 Vitamin D deficiency   ?  ?Past Surgical History:  ?Procedure Laterality Date  ? CARDIOVERSION N/A 05/05/2016  ? Procedure:  CARDIOVERSION;  Surgeon: Josue Hector, MD;  Location: [place];  Service: Cardiovascular;  Laterality: N/A;  ? CAROTID ENDARTERECTOMY Left 2003  ? CAROTID-SUBCLAVIAN BYPASS GRAFT Right   ? COLONOSCOPY  2005  ? hyperplastic polyp x 1 with no adenoma  ? COLONOSCOPY N/A 04/11/2014  ? YIR:SWNI diverticulosis in the sigmoid colon/left colon is redundant  ? CORONARY ARTERY BYPASS GRAFT    ? ESOPHAGEAL DILATION N/A 04/11/2014  ? Procedure: ESOPHAGEAL DILATION;  Surgeon: [name], MD;  [facility];  Service: Endoscopy;  Laterality: N/A;  ? ESOPHAGOGASTRODUODENOSCOPY N/A 04/11/2014  ? [id number] at the gastro junction/moderate erosive/duodenal web  ? EYE SURGERY Bilateral   ? cataracts  ? RIGHT/LEFT HEART CATH AND CORONARY/GRAFT ANGIOGRAPHY N/A 02/11/2020  ? Procedure: RIGHT/LEFT HEART CATH AND CORONARY/GRAFT ANGIOGRAPHY;  Surgeon: Martinique, Peter M, MD;  Location: [place] [facility];  Service: Cardiovascular;  Laterality: N/A;  ? SKIN CANCER EXCISION    ?  ?Social History  ? ?  Socioeconomic History  ? Marital status: Married  ?  Spouse name: ruby  ? Number of children: 1  ? Years of education: Not on file  ? Highest education level: Not on file  ?Occupational History  ? Occupation: retired  ?  Employer: [employer]  ?Tobacco Use  ? Smoking status: Former  ?  Types: Cigarettes  ?  Start date: 01/24/1950  ?  Quit date: 01/24/1961  ?  Years since quitting: 60.3  ? Smokeless tobacco: Never  ?Vaping Use  ? Vaping Use: Never used  ?Substance and Sexual Activity  ? Alcohol use: Not Currently  ?  Alcohol/week: 0.0 standard drinks  ?  Comment: not drank since 12/2009  ? Drug use: No  ? Sexual activity: Yes  ?  Partners: Female  ?Other Topics Concern  ? Not on file  ?Social History Narrative  ? Not on file  ? ?Social Determinants of Health  ? ?Financial Resource Strain: Not on file  ?Food Insecurity: Not on file  ?Transportation Needs: Not on file  ?Physical Activity: Not on file  ?Stress: Not on file  ?Social Connections:  Not on file  ?Intimate Partner Violence: Not on file  ?  ? ?   ?Objective:  ?  ?BP (!) 143/82   Pulse 93   Temp (!) 97.3 ?F (36.3 ?C) (Temporal)   SpO2 100%  ? ?Wt Readings from Last 3 Encounters:  ?05/14/21 172 lb

## 2021-05-19 ENCOUNTER — Other Ambulatory Visit: Payer: Self-pay | Admitting: Family Medicine

## 2021-05-19 DIAGNOSIS — E875 Hyperkalemia: Secondary | ICD-10-CM

## 2021-05-19 LAB — CBC WITH DIFFERENTIAL/PLATELET
Basophils Absolute: 0.1 10*3/uL (ref 0.0–0.2)
Basos: 2 %
EOS (ABSOLUTE): 0.1 10*3/uL (ref 0.0–0.4)
Eos: 2 %
Hematocrit: 37.9 % (ref 37.5–51.0)
Hemoglobin: 12.7 g/dL — ABNORMAL LOW (ref 13.0–17.7)
Immature Grans (Abs): 0.1 10*3/uL (ref 0.0–0.1)
Immature Granulocytes: 1 %
Lymphocytes Absolute: 0.9 10*3/uL (ref 0.7–3.1)
Lymphs: 14 %
MCH: 32.9 pg (ref 26.6–33.0)
MCHC: 33.5 g/dL (ref 31.5–35.7)
MCV: 98 fL — ABNORMAL HIGH (ref 79–97)
Monocytes Absolute: 0.8 10*3/uL (ref 0.1–0.9)
Monocytes: 13 %
Neutrophils Absolute: 4.2 10*3/uL (ref 1.4–7.0)
Neutrophils: 68 %
Platelets: 346 10*3/uL (ref 150–450)
RBC: 3.86 x10E6/uL — ABNORMAL LOW (ref 4.14–5.80)
RDW: 14.7 % (ref 11.6–15.4)
WBC: 6.2 10*3/uL (ref 3.4–10.8)

## 2021-05-19 LAB — CMP14+EGFR
ALT: 28 IU/L (ref 0–44)
AST: 31 IU/L (ref 0–40)
Albumin/Globulin Ratio: 1.4 (ref 1.2–2.2)
Albumin: 3.7 g/dL (ref 3.6–4.6)
Alkaline Phosphatase: 214 IU/L — ABNORMAL HIGH (ref 44–121)
BUN/Creatinine Ratio: 25 — ABNORMAL HIGH (ref 10–24)
BUN: 23 mg/dL (ref 8–27)
Bilirubin Total: 1.3 mg/dL — ABNORMAL HIGH (ref 0.0–1.2)
CO2: 24 mmol/L (ref 20–29)
Calcium: 9.4 mg/dL (ref 8.6–10.2)
Chloride: 100 mmol/L (ref 96–106)
Creatinine, Ser: 0.92 mg/dL (ref 0.76–1.27)
Globulin, Total: 2.6 g/dL (ref 1.5–4.5)
Glucose: 100 mg/dL — ABNORMAL HIGH (ref 70–99)
Potassium: 6.5 mmol/L (ref 3.5–5.2)
Sodium: 137 mmol/L (ref 134–144)
Total Protein: 6.3 g/dL (ref 6.0–8.5)
eGFR: 80 mL/min/{1.73_m2} (ref >59)

## 2021-05-21 ENCOUNTER — Other Ambulatory Visit: Payer: Medicare HMO

## 2021-05-21 DIAGNOSIS — E875 Hyperkalemia: Secondary | ICD-10-CM

## 2021-05-22 LAB — BMP8+EGFR
BUN/Creatinine Ratio: 19 (ref 10–24)
BUN: 18 mg/dL (ref 8–27)
CO2: 22 mmol/L (ref 20–29)
Calcium: 8.9 mg/dL (ref 8.6–10.2)
Chloride: 101 mmol/L (ref 96–106)
Creatinine, Ser: 0.93 mg/dL (ref 0.76–1.27)
Glucose: 96 mg/dL (ref 70–99)
Potassium: 5.3 mmol/L — ABNORMAL HIGH (ref 3.5–5.2)
Sodium: 137 mmol/L (ref 134–144)
eGFR: 79 mL/min/{1.73_m2} (ref >59)

## 2021-05-25 ENCOUNTER — Encounter: Payer: Self-pay | Admitting: Family Medicine

## 2021-05-25 DIAGNOSIS — F0390 Unspecified dementia without behavioral disturbance: Secondary | ICD-10-CM | POA: Insufficient documentation

## 2021-05-27 ENCOUNTER — Ambulatory Visit (INDEPENDENT_AMBULATORY_CARE_PROVIDER_SITE_OTHER): Payer: Medicare HMO

## 2021-05-27 ENCOUNTER — Telehealth: Payer: Self-pay | Admitting: Family Medicine

## 2021-05-27 DIAGNOSIS — G9341 Metabolic encephalopathy: Secondary | ICD-10-CM | POA: Diagnosis not present

## 2021-05-27 DIAGNOSIS — I4819 Other persistent atrial fibrillation: Secondary | ICD-10-CM | POA: Diagnosis not present

## 2021-05-27 DIAGNOSIS — E871 Hypo-osmolality and hyponatremia: Secondary | ICD-10-CM | POA: Diagnosis not present

## 2021-05-27 DIAGNOSIS — B370 Candidal stomatitis: Secondary | ICD-10-CM

## 2021-05-27 DIAGNOSIS — D631 Anemia in chronic kidney disease: Secondary | ICD-10-CM

## 2021-05-27 DIAGNOSIS — I6529 Occlusion and stenosis of unspecified carotid artery: Secondary | ICD-10-CM

## 2021-05-27 DIAGNOSIS — E559 Vitamin D deficiency, unspecified: Secondary | ICD-10-CM

## 2021-05-27 DIAGNOSIS — I2510 Atherosclerotic heart disease of native coronary artery without angina pectoris: Secondary | ICD-10-CM | POA: Diagnosis not present

## 2021-05-27 DIAGNOSIS — E785 Hyperlipidemia, unspecified: Secondary | ICD-10-CM

## 2021-05-27 DIAGNOSIS — K219 Gastro-esophageal reflux disease without esophagitis: Secondary | ICD-10-CM

## 2021-05-27 DIAGNOSIS — N182 Chronic kidney disease, stage 2 (mild): Secondary | ICD-10-CM

## 2021-05-27 DIAGNOSIS — F419 Anxiety disorder, unspecified: Secondary | ICD-10-CM

## 2021-05-27 DIAGNOSIS — I129 Hypertensive chronic kidney disease with stage 1 through stage 4 chronic kidney disease, or unspecified chronic kidney disease: Secondary | ICD-10-CM

## 2021-05-27 DIAGNOSIS — R41841 Cognitive communication deficit: Secondary | ICD-10-CM

## 2021-05-27 DIAGNOSIS — I272 Pulmonary hypertension, unspecified: Secondary | ICD-10-CM

## 2021-05-27 MED ORDER — NYSTATIN 100000 UNIT/ML MT SUSP
5.0000 mL | Freq: Four times a day (QID) | OROMUCOSAL | 0 refills | Status: DC
Start: 2021-05-27 — End: 2021-06-03

## 2021-05-27 NOTE — Telephone Encounter (Signed)
Prescription sent to [facility]. ?

## 2021-05-27 NOTE — Telephone Encounter (Signed)
Patient informed. 

## 2021-06-03 ENCOUNTER — Ambulatory Visit (INDEPENDENT_AMBULATORY_CARE_PROVIDER_SITE_OTHER): Payer: Medicare HMO | Admitting: Family Medicine

## 2021-06-03 ENCOUNTER — Ambulatory Visit: Payer: Medicare HMO | Admitting: Family Medicine

## 2021-06-03 ENCOUNTER — Encounter: Payer: Self-pay | Admitting: Family Medicine

## 2021-06-03 VITALS — BP 138/76 | HR 85 | Temp 98.0°F | Ht 74.0 in

## 2021-06-03 DIAGNOSIS — K143 Hypertrophy of tongue papillae: Secondary | ICD-10-CM

## 2021-06-03 MED ORDER — CLOTRIMAZOLE 10 MG MT TROC: 10.0000 mg | Freq: Every day | OROMUCOSAL | 0 refills | Status: AC

## 2021-06-03 NOTE — Patient Instructions (Signed)

## 2021-06-03 NOTE — Progress Notes (Signed)
? ?  Acute Office Visit ? ?Subjective:  ? ?  ?Patient ID: Danny [name]., male    DOB: [birth year], 88 y.o.   MRN: [id number] ? ?Chief Complaint  ?Patient presents with  ? Thrush  ? ? ?HPI ?Here with wife who helped to provide the history. Patient is in today for follow up of oral thrush. This was initially noticed by SLP after his discharge from SNF on 05/18/21. He completed a week of diflucan. A few days later he called to report that this had not resolved and nystatin oral was sent in QID for 1 week. They report that this is not any better. He denies pain. He has taken his medications as prescribed. He has had coffee to drink this morning.  ? ?ROS ?As per HPI.  ? ?   ?Objective:  ?  ?BP 138/76   Pulse 85   Temp 98 ?F (36.7 ?C) (Temporal)   Ht '6\' 2"'$  (1.88 m)   SpO2 99%   BMI 22.08 kg/m?  ? ? ?Physical Exam ?Vitals and nursing note reviewed.  ?Constitutional:   ?   General: He is not in acute distress. ?   Appearance: He is not ill-appearing, toxic-appearing or diaphoretic.  ?HENT:  ?   Mouth/Throat:  ?   Comments: Brown coating to tongue, unable to scrap off.  ?Pulmonary:  ?   Effort: Pulmonary effort is normal. No respiratory distress.  ?Neurological:  ?   Mental Status: He is alert. Mental status is at baseline.  ?   Gait: Gait abnormal (wheelchair).  ?Psychiatric:     ?   Mood and Affect: Mood normal.     ?   Behavior: Behavior is cooperative.     ?   Cognition and Memory: Cognition normal.  ? ? ?No results found for any visits on 06/03/21. ? ? ?   ?Assessment & Plan:  ? ?Kalon was seen today for thrush. ? ?Diagnoses and all orders for this visit: ? ?Brown tongue ?Has been treated with nystatin and diflucan for 7 days each. ? Resistant thrush (vs hairy tongue vs poor oral hygiene. Unable to scrap off coating today. Will treat with clotrimazole as below. Discussed hygiene.  ?-     clotrimazole (MYCELEX) 10 MG troche; Take 1 tablet (10 mg total) by mouth 5 (five) times daily for 14 days. ? ? ?Return if  symptoms worsen or fail to improve. ? ?The patient indicates understanding of these issues and agrees with the plan. ? ?Gwenlyn Perking, FNP ? ? ?

## 2021-06-29 ENCOUNTER — Encounter: Payer: Self-pay | Admitting: Family Medicine

## 2021-06-29 ENCOUNTER — Ambulatory Visit (INDEPENDENT_AMBULATORY_CARE_PROVIDER_SITE_OTHER): Payer: Medicare HMO | Admitting: Family Medicine

## 2021-06-29 VITALS — BP 130/79 | HR 86 | Temp 97.4°F | Ht 74.0 in | Wt 169.0 lb

## 2021-06-29 DIAGNOSIS — E559 Vitamin D deficiency, unspecified: Secondary | ICD-10-CM | POA: Diagnosis not present

## 2021-06-29 DIAGNOSIS — Z23 Encounter for immunization: Secondary | ICD-10-CM

## 2021-06-29 DIAGNOSIS — E7800 Pure hypercholesterolemia, unspecified: Secondary | ICD-10-CM | POA: Diagnosis not present

## 2021-06-29 DIAGNOSIS — I700 Atherosclerosis of aorta: Secondary | ICD-10-CM | POA: Diagnosis not present

## 2021-06-29 DIAGNOSIS — F0390 Unspecified dementia without behavioral disturbance: Secondary | ICD-10-CM

## 2021-06-29 DIAGNOSIS — E871 Hypo-osmolality and hyponatremia: Secondary | ICD-10-CM

## 2021-06-29 DIAGNOSIS — R600 Localized edema: Secondary | ICD-10-CM

## 2021-06-29 DIAGNOSIS — I10 Essential (primary) hypertension: Secondary | ICD-10-CM

## 2021-06-29 DIAGNOSIS — I480 Paroxysmal atrial fibrillation: Secondary | ICD-10-CM

## 2021-06-29 DIAGNOSIS — K219 Gastro-esophageal reflux disease without esophagitis: Secondary | ICD-10-CM | POA: Diagnosis not present

## 2021-06-29 MED ORDER — OMEPRAZOLE 20 MG PO CPDR: 20.0000 mg | DELAYED_RELEASE_CAPSULE | Freq: Two times a day (BID) | ORAL | 3 refills | Status: AC

## 2021-06-29 NOTE — Progress Notes (Signed)
Assessment & Plan:  1. Essential hypertension Well controlled on current regimen.  - CBC with Differential/Platelet - CMP14+EGFR - Lipid panel - TSH  2. HYPERCHOLESTEROLEMIA  IIA Well controlled on current regimen.  - CBC with Differential/Platelet - CMP14+EGFR - Lipid panel  3. Paroxysmal atrial fibrillation (HCC) Continue current regimen. - CMP14+EGFR  4. Thoracic aorta atherosclerosis (HCC) Continue current regimen. - Lipid panel  5. Edema of both lower extremities Well controlled on current regimen.  - CMP14+EGFR  6. Gastroesophageal reflux disease, unspecified whether esophagitis present Well controlled on current regimen.  - omeprazole (PRILOSEC) 20 MG capsule; Take 1 capsule (20 mg total) by mouth 2 (two) times daily before a meal.  Dispense: 180 capsule; Refill: 3 - CMP14+EGFR  7. Hyponatremia - CMP14+EGFR  8. Vitamin D deficiency - VITAMIN D 25 Hydroxy (Vit-D Deficiency, Fractures)  9. Dementia, old age [facility]) No longer on medication.  Wife knows they will need an assisted living facility in the near future, but she is not ready yet.  10. Immunization due - Varicella-zoster vaccine IM (Shingrix)   Return in about 3 months (around 09/29/2021) for follow-up of chronic medication conditions.  [name], MSN, APRN, FNP-C [facility]  Subjective:    Patient ID: Danny Sundt., male    DOB: 1931/03/27, 88 y.o.   MRN: [id number]  Patient Care Team: Loman Brooklyn, FNP as PCP - General (Family Medicine) Josue Hector, MD as PCP - Cardiology (Cardiology) Josue Hector, MD as Attending Physician (Cardiology) [name], MD (Inactive) as Consulting Physician (Gastroenterology) Okey Regal, OD (Optometry) Rexene Alberts, MD (Inactive) as Consulting Physician (Cardiothoracic Surgery) Allyn Kenner, MD as Consulting Physician (Dermatology)   Chief Complaint:  Chief Complaint  Patient presents with   Medical  Management of Chronic Issues    6 week follow up     HPI: Danny [name]. is a 88 y.o. male presenting on 06/29/2021 for Medical Management of Chronic Issues (6 week follow up )  Patient is accompanied by his wife who he is okay with being present.  Hypertension/Atrial fibrillation: He is on metoprolol 50 mg daily for rate control. He is no longer on anticoagulation due to risks outweighing benefits. Warfarin was stopped in April 2023.   Hyperlipidemia/Thoracic aorta atherosclerosis: He is taking a statin and aspirin daily.   Peripheral edema: He takes lasix and wears compression hose.  GERD: Taking Prilosec daily.  Vitamin D Deficiency: Taking vitamin D daily. Last vitamin D level WNL on 04/16/2020.   Dementia: was previously prescribed Aricept 5 mg daily. His wife reports today it didn't make a difference so she quit giving it to him.   New complaints: None   Social history:  Relevant past medical, surgical, family and social history reviewed and updated as indicated. Interim medical history since our last visit reviewed.  Allergies and medications reviewed and updated.  DATA REVIEWED: CHART IN EPIC  ROS: Negative unless specifically indicated above in HPI.    Current Outpatient Medications:    acetaminophen (TYLENOL) 325 MG tablet, Take 650 mg by mouth every 6 (six) hours as needed., Disp: , Rfl:    aspirin 81 MG chewable tablet, Chew 81 mg by mouth daily. atrial fibrillation, Disp: , Rfl:    cholecalciferol (VITAMIN D) 25 MCG (1000 UNIT) tablet, Take 1,000 Units by mouth daily., Disp: , Rfl:    donepezil (ARICEPT ODT) 5 MG disintegrating tablet, Take 1 tablet (5 mg total) by mouth at  bedtime., Disp: 30 tablet, Rfl: 2   Ensure (ENSURE), Take 1 Can by mouth as needed., Disp: , Rfl:    furosemide (LASIX) 20 MG tablet, Take 1 tablet (20 mg total) by mouth daily., Disp: 90 tablet, Rfl: 1   levocetirizine (XYZAL) 5 MG tablet, Take 1 tablet (5 mg total) by mouth every evening.,  Disp: 90 tablet, Rfl: 1   lovastatin (MEVACOR) 40 MG tablet, Take 1 tablet (40 mg total) by mouth at bedtime., Disp: 30 tablet, Rfl: 0   metoprolol succinate (TOPROL-XL) 50 MG 24 hr tablet, Take 1 tablet (50 mg total) by mouth daily. Take with or immediately following a meal., Disp: 30 tablet, Rfl: 0   Multiple Vitamins-Minerals (PRESERVISION AREDS 2 PO), Take 1 tablet by mouth in the morning and at bedtime., Disp: , Rfl:    omeprazole (PRILOSEC) 20 MG capsule, TAKE 1 CAPSULE TWICE DAILY BEFORE MEALS (Patient taking differently: Take 20 mg by mouth daily.), Disp: 180 capsule, Rfl: 0   potassium chloride SA (KLOR-CON M) 20 MEQ tablet, Take 2 tablets (40 mEq total) by mouth daily., Disp: 60 tablet, Rfl: 0   Probiotic Product (RISA-BID PROBIOTIC) TABS, Take by mouth. 1 billion cell- 250 mg; oral Once A Day - PRN Special Instructions: abdominal discomfort, Disp: , Rfl:    Allergies  Allergen Reactions   Doxycycline Diarrhea   Contrast Media [Iodinated Contrast Media]     Syncope   Lunesta [Eszopiclone]     "felt WILD"   Morphine And Related Nausea And Vomiting   Trazodone And Nefazodone Other (See Comments)    Extremely drowsy the next day after taking.   Past Medical History:  Diagnosis Date   Anxiety    Atrial fibrillation ([place])    CAD, ARTERY BYPASS GRAFT 05/19/2008   Qualifier: Diagnosis of  By: Mare Ferrari, RMA, [place]     Enteritis due to Clostridium difficile, history of 11/21/2012   GERD (gastroesophageal reflux disease)    Hyperlipidemia    Hypertension    Internal carotid artery stenosis    Shingles 06/2020   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 01/14/2014   Vitamin D deficiency     Past Surgical History:  Procedure Laterality Date   CARDIOVERSION N/A 05/05/2016   Procedure: CARDIOVERSION;  Surgeon: Josue Hector, MD;  Location: [place];  Service: Cardiovascular;  Laterality: N/A;   CAROTID ENDARTERECTOMY Left 2003   CAROTID-SUBCLAVIAN BYPASS GRAFT Right     COLONOSCOPY  2005   hyperplastic polyp x 1 with no adenoma   COLONOSCOPY N/A 04/11/2014   EYC:XKGY diverticulosis in the sigmoid colon/left colon is redundant   CORONARY ARTERY BYPASS GRAFT     ESOPHAGEAL DILATION N/A 04/11/2014   Procedure: ESOPHAGEAL DILATION;  Surgeon: [name], MD;  [facility];  Service: Endoscopy;  Laterality: N/A;   ESOPHAGOGASTRODUODENOSCOPY N/A 04/11/2014   [id number] at the gastro junction/moderate erosive/duodenal web   EYE SURGERY Bilateral    cataracts   RIGHT/LEFT HEART CATH AND CORONARY/GRAFT ANGIOGRAPHY N/A 02/11/2020   Procedure: RIGHT/LEFT HEART CATH AND CORONARY/GRAFT ANGIOGRAPHY;  Surgeon: Martinique, Peter M, MD;  Location: [place] [facility];  Service: Cardiovascular;  Laterality: N/A;   SKIN CANCER EXCISION      Social History   Socioeconomic History   Marital status: Married    Spouse name: ruby   Number of children: 1   Years of education: Not on file   Highest education level: Not on file  Occupational History  Occupation: retired    Fish farm manager: [employer]  Tobacco Use   Smoking status: Former    Types: Cigarettes    Start date: 01/24/1950    Quit date: 01/24/1961    Years since quitting: 60.4   Smokeless tobacco: Never  Vaping Use   Vaping Use: Never used  Substance and Sexual Activity   Alcohol use: Not Currently    Alcohol/week: 0.0 standard drinks    Comment: not drank since 12/2009   Drug use: No   Sexual activity: Yes    Partners: Female  Other Topics Concern   Not on file  Social History Narrative   Not on file   Social Determinants of Health   Financial Resource Strain: Not on file  Food Insecurity: Not on file  Transportation Needs: Not on file  Physical Activity: Not on file  Stress: Not on file  Social Connections: Not on file  Intimate Partner Violence: Not on file        Objective:    BP 130/79   Pulse 86   Temp (!) 97.4 F (36.3 C) (Temporal)   Ht 6' 2"  (1.88 m)   Wt 169 lb (76.7 kg)   SpO2  96%   BMI 21.70 kg/m   Wt Readings from Last 3 Encounters:  06/29/21 169 lb (76.7 kg)  05/14/21 172 lb (78 kg)  05/04/21 163 lb 9.6 oz (74.2 kg)    Physical Exam Vitals reviewed.  Constitutional:      General: He is not in acute distress.    Appearance: Normal appearance. He is normal weight. He is not ill-appearing, toxic-appearing or diaphoretic.  HENT:     Head: Normocephalic and atraumatic.     Right Ear: Tympanic membrane, ear canal and external ear normal. There is no impacted cerumen.     Left Ear: Tympanic membrane, ear canal and external ear normal. There is no impacted cerumen.     Nose: Nose normal. No congestion or rhinorrhea.     Mouth/Throat:     Mouth: Mucous membranes are moist.     Pharynx: Oropharynx is clear. No oropharyngeal exudate or posterior oropharyngeal erythema.  Eyes:     General: No scleral icterus.       Right eye: No discharge.        Left eye: No discharge.     Conjunctiva/sclera: Conjunctivae normal.     Pupils: Pupils are equal, round, and reactive to light.  Neck:     Vascular: No carotid bruit.  Cardiovascular:     Rate and Rhythm: Normal rate. Rhythm irregular.     Heart sounds: Normal heart sounds. No murmur heard.   No friction rub. No gallop.  Pulmonary:     Effort: Pulmonary effort is normal. No respiratory distress.     Breath sounds: Normal breath sounds. No stridor. No wheezing, rhonchi or rales.  Abdominal:     General: Abdomen is flat. Bowel sounds are normal. There is no distension.     Palpations: Abdomen is soft. There is no hepatomegaly, splenomegaly or mass.     Tenderness: There is no abdominal tenderness. There is no guarding or rebound.     Hernia: No hernia is present.  Musculoskeletal:        General: Normal range of motion.     Cervical back: Normal range of motion and neck supple. No rigidity. No muscular tenderness.     Right lower leg: No edema.     Left lower leg: No edema.  Lymphadenopathy:  Cervical: No  cervical adenopathy.  Skin:    General: Skin is warm and dry.     Capillary Refill: Capillary refill takes less than 2 seconds.  Neurological:     General: No focal deficit present.     Mental Status: He is alert and oriented to person, place, and time. Mental status is at baseline.     Motor: Weakness present.     Gait: Gait abnormal (riding in [facility]).  Psychiatric:        Mood and Affect: Mood normal.        Behavior: Behavior normal.        Thought Content: Thought content normal.        Judgment: Judgment normal.    Lab Results  Component Value Date   TSH 1.620 03/11/2021   Lab Results  Component Value Date   WBC 6.2 05/18/2021   HGB 12.7 (L) 05/18/2021   HCT 37.9 05/18/2021   MCV 98 (H) 05/18/2021   PLT 346 05/18/2021   Lab Results  Component Value Date   NA 137 05/21/2021   K 5.3 (H) 05/21/2021   CO2 22 05/21/2021   GLUCOSE 96 05/21/2021   BUN 18 05/21/2021   CREATININE 0.93 05/21/2021   BILITOT 1.3 (H) 05/18/2021   ALKPHOS 214 (H) 05/18/2021   AST 31 05/18/2021   ALT 28 05/18/2021   PROT 6.3 05/18/2021   ALBUMIN 3.7 05/18/2021   CALCIUM 8.9 05/21/2021   ANIONGAP 7 05/13/2021   EGFR 79 05/21/2021   Lab Results  Component Value Date   CHOL 150 10/28/2020   Lab Results  Component Value Date   HDL 88 10/28/2020   Lab Results  Component Value Date   LDLCALC 50 10/28/2020   Lab Results  Component Value Date   TRIG 58 10/28/2020   Lab Results  Component Value Date   CHOLHDL 1.7 10/28/2020   No results found for: HGBA1C

## 2021-06-30 LAB — CMP14+EGFR
ALT: 23 IU/L (ref 0–44)
AST: 29 IU/L (ref 0–40)
Albumin/Globulin Ratio: 1.4 (ref 1.2–2.2)
Albumin: 3.5 g/dL — ABNORMAL LOW (ref 3.6–4.6)
Alkaline Phosphatase: 210 IU/L — ABNORMAL HIGH (ref 44–121)
BUN/Creatinine Ratio: 21 (ref 10–24)
BUN: 20 mg/dL (ref 8–27)
Bilirubin Total: 1.1 mg/dL (ref 0.0–1.2)
CO2: 23 mmol/L (ref 20–29)
Calcium: 8.8 mg/dL (ref 8.6–10.2)
Chloride: 101 mmol/L (ref 96–106)
Creatinine, Ser: 0.97 mg/dL (ref 0.76–1.27)
Globulin, Total: 2.5 g/dL (ref 1.5–4.5)
Glucose: 94 mg/dL (ref 70–99)
Potassium: 4.2 mmol/L (ref 3.5–5.2)
Sodium: 139 mmol/L (ref 134–144)
Total Protein: 6 g/dL (ref 6.0–8.5)
eGFR: 75 mL/min/{1.73_m2} (ref >59)

## 2021-06-30 LAB — CBC WITH DIFFERENTIAL/PLATELET
Basophils Absolute: 0 10*3/uL (ref 0.0–0.2)
Basos: 1 %
EOS (ABSOLUTE): 0 10*3/uL (ref 0.0–0.4)
Eos: 0 %
Hematocrit: 40 % (ref 37.5–51.0)
Hemoglobin: 13.4 g/dL (ref 13.0–17.7)
Immature Grans (Abs): 0 10*3/uL (ref 0.0–0.1)
Immature Granulocytes: 0 %
Lymphocytes Absolute: 0.6 10*3/uL — ABNORMAL LOW (ref 0.7–3.1)
Lymphs: 11 %
MCH: 31.3 pg (ref 26.6–33.0)
MCHC: 33.5 g/dL (ref 31.5–35.7)
MCV: 94 fL (ref 79–97)
Monocytes Absolute: 0.7 10*3/uL (ref 0.1–0.9)
Monocytes: 13 %
Neutrophils Absolute: 4.3 10*3/uL (ref 1.4–7.0)
Neutrophils: 75 %
Platelets: 191 10*3/uL (ref 150–450)
RBC: 4.28 x10E6/uL (ref 4.14–5.80)
RDW: 13.9 % (ref 11.6–15.4)
WBC: 5.8 10*3/uL (ref 3.4–10.8)

## 2021-06-30 LAB — LIPID PANEL
Chol/HDL Ratio: 2.2 ratio (ref 0.0–5.0)
Cholesterol, Total: 122 mg/dL (ref 100–199)
HDL: 56 mg/dL (ref >39)
LDL Chol Calc (NIH): 56 mg/dL (ref 0–99)
Triglycerides: 39 mg/dL (ref 0–149)
VLDL Cholesterol Cal: 10 mg/dL (ref 5–40)

## 2021-06-30 LAB — VITAMIN D 25 HYDROXY (VIT D DEFICIENCY, FRACTURES): Vit D, 25-Hydroxy: 67.9 ng/mL (ref 30.0–100.0)

## 2021-06-30 LAB — TSH: TSH: 1.72 u[IU]/mL (ref 0.450–4.500)

## 2021-07-19 ENCOUNTER — Other Ambulatory Visit: Payer: Self-pay | Admitting: Family Medicine

## 2021-07-19 ENCOUNTER — Other Ambulatory Visit: Payer: Self-pay | Admitting: Adult Health

## 2021-09-07 NOTE — Progress Notes (Deleted)
Cardiology Office Note   Date:  09/07/2021   ID:  Danny Loron., DOB 12/31/1931, MRN [id number]  PCP:  Loman Brooklyn, FNP Cardiologist:  [name], MD 02/01/2019 tele-visit Electrphysiologist: None [name], MD   No chief complaint on file.   History of Present Illness: Danny Peterka. is a 88 y.o. male with a history of  PVD s/p L CEA w/ R subclavian bpg 2009 and PTCA 2010, CAD s/p CABG 2003 w/ LIMA-LAD, RIMA-RI, SVG-OM3, PAF on coumadin, HTN, HLD, and GERD INR;s have been Rx with no bleeding issues BP is well controlled   Deferred DOAC due to cost  Had dizziness with amiodarone resolved on lower dose of 200 mg daily   Declined [place] at [facility] appointment 11/05/19 and decided to stay On amiodarone for rate control since he felt better   TTE done 11/07/19 with EF 50-55% Mild LAE Severe RAE and RV dysfunction with severe TR Estimated PA around 60 mmHg Mild AS with mean gradient 14 peak 25 mmHg with low DVI 0.2   Duplex 11/07/19 with right ICA stenosis 80-99% but patent CCA to subclavian graft and plaque in LICA   Theres been issues with excess ETOH in past. Has urinary retention and needs indwelling foley for procedures   Cath done 02/11/20 Patent LIMA to LAD Patent SVG OM Atretic RIMA to ramus but fills from circ no obstructive disease in native RCA Normal filling pressures, right heart pressures , LVEDP and CO. Patent right subclavian bypass   Hospitalized 02/05/21 for confusion and elevated BP. With chronic afib at higher rate. 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 Danny [name] has some dementia Note his coumadin has been d/c due to high INR;s and fall  risk  ***   Past Medical History:  Diagnosis Date   Anxiety    Atrial fibrillation ([employer])    CAD, ARTERY BYPASS GRAFT 05/19/2008   Qualifier: Diagnosis of  By: Mare Ferrari, RMA, Sherri     Enteritis due to Clostridium difficile, history of 11/21/2012   GERD (gastroesophageal reflux disease)    Hyperlipidemia    Hypertension    Internal carotid artery stenosis    Shingles 06/2020   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 01/14/2014   Vitamin D deficiency     Past Surgical History:  Procedure Laterality Date   CARDIOVERSION N/A 05/05/2016   Procedure: CARDIOVERSION;  Surgeon: Josue Hector, MD;  Location: [place];  Service: Cardiovascular;  Laterality: N/A;   CAROTID ENDARTERECTOMY Left 2003   CAROTID-SUBCLAVIAN BYPASS GRAFT Right    COLONOSCOPY  2005   hyperplastic polyp x 1 with no adenoma   COLONOSCOPY N/A 04/11/2014   HUD:JSHF diverticulosis in the sigmoid colon/left colon is redundant   CORONARY ARTERY BYPASS GRAFT     ESOPHAGEAL DILATION N/A 04/11/2014   Procedure: ESOPHAGEAL DILATION;  Surgeon: [name], MD;  [facility];  Service: Endoscopy;  Laterality: N/A;   ESOPHAGOGASTRODUODENOSCOPY N/A 04/11/2014   WYO:VZCHYIFOY at the gastro junction/moderate erosive/duodenal web   EYE SURGERY Bilateral    cataracts   RIGHT/LEFT HEART CATH AND CORONARY/GRAFT ANGIOGRAPHY N/A 02/11/2020  Procedure: RIGHT/LEFT HEART CATH AND CORONARY/GRAFT ANGIOGRAPHY;  Surgeon: Martinique, Derick Seminara M, MD;  Location: [place] [facility];  Service: Cardiovascular;  Laterality: N/A;   SKIN CANCER EXCISION      Current Outpatient Medications  Medication Sig Dispense Refill   acetaminophen (TYLENOL) 325 MG tablet Take 650 mg by mouth every 6 (six) hours as needed.     aspirin 81 MG chewable tablet Chew 81 mg by mouth daily. atrial fibrillation     cholecalciferol (VITAMIN D) 25 MCG (1000 UNIT) tablet Take 1,000 Units by mouth daily.     Ensure (ENSURE) Take 1 Can by mouth as  needed.     furosemide (LASIX) 20 MG tablet Take 1 tablet (20 mg total) by mouth daily. 90 tablet 1   levocetirizine (XYZAL) 5 MG tablet Take 1 tablet (5 mg total) by mouth every evening. 90 tablet 1   lovastatin (MEVACOR) 40 MG tablet Take 1 tablet (40 mg total) by mouth at bedtime. 30 tablet 0   metoprolol succinate (TOPROL-XL) 50 MG 24 hr tablet Take 1 tablet (50 mg total) by mouth daily. 90 tablet 1   Multiple Vitamins-Minerals (PRESERVISION AREDS 2 PO) Take 1 tablet by mouth in the morning and at bedtime.     omeprazole (PRILOSEC) 20 MG capsule Take 1 capsule (20 mg total) by mouth 2 (two) times daily before a meal. 180 capsule 3   potassium chloride SA (KLOR-CON M) 20 MEQ tablet Take 2 tablets (40 mEq total) by mouth daily. 180 tablet 1   Probiotic Product (RISA-BID PROBIOTIC) TABS Take by mouth. 1 billion cell- 250 mg; oral Once A Day - PRN Special Instructions: abdominal discomfort     No current facility-administered medications for this visit.    Allergies:   Doxycycline, Contrast media [iodinated contrast media], Lunesta [eszopiclone], Morphine and related, and Trazodone and nefazodone    Social History:  The patient  reports that he quit smoking about 60 years ago. His smoking use included cigarettes. He started smoking about 71 years ago. He has never used smokeless tobacco. He reports that he does not currently use alcohol. He reports that he does not use drugs.   Family History:  The patient's family history includes Diabetes in his son; Heart disease in his mother; Kidney disease in his sister; Lung cancer in his brother; Mental illness in his sister; Other in his sister; Stroke in his father, mother, and son.  He indicated that his mother is deceased. He indicated that his father is deceased. He indicated that all of his three sisters are deceased. He indicated that his brother is deceased. He indicated that his maternal grandmother is deceased. He indicated that his maternal  grandfather is deceased. He indicated that his paternal grandmother is deceased. He indicated that his paternal grandfather is deceased. He indicated that his son is alive. He indicated that the status of his neg hx is unknown.   ROS:  Please see the history of present illness. All other systems are reviewed and negative.    PHYSICAL EXAM: VS:  There were no vitals taken for this visit. , BMI There is no height or weight on file to calculate BMI.  Affect appropriate Healthy:  appears stated age 74: normal Neck supple with no adenopathy JVP elevated bilateral  bruits no thyromegaly Lungs clear with no wheezing and good diaphragmatic motion Heart:  S1/S2 AS  murmur, no rub, gallop or click PMI normal Abdomen: benighn, BS positve, no tenderness, no AAA no bruit.  No HSM or HJR Distal pulses intact with no bruits No edema Neuro non-focal Skin warm and dry No muscular weakness    EKG:   Afib rate 85 poor R wave progression 11/05/19   Echo:  02/06/21  IMPRESSIONS     1. Left ventricular ejection fraction, by estimation, is 40-45%. The left  ventricle has mildly decreased function. The left ventricle demonstrates  regional wall motion abnormalities (abnormal septal motion). There is mild  concentric left ventricular  hypertrophy. Left ventricular diastolic parameters are indeterminate.  There is the interventricular septum is flattened in systole, consistent  with right ventricular pressure overload.   2. Right ventricular systolic function is mildly reduced. The right  ventricular size is enlarged. There is moderately elevated pulmonary  artery systolic pressure. The estimated right ventricular systolic  pressure is 38.1 mmHg.   3. Right atrial size was moderately dilated.   4. The mitral valve is abnormal. No evidence of mitral valve  regurgitation. No evidence of mitral stenosis. The mean mitral valve  gradient is 3.0 mmHg with average heart rate of 119 bpm. Mitral valve  area  by continuity equation 3.3 cm2.   5. Tricuspid valve regurgitation is severe.   6. The aortic valve is calcified. Aortic valve regurgitation is not  visualized. Moderate aortic valve stenosis. There is low flow low gradient  aortic stenosis, mean gradient 25 mm Hg but in the setting of decreased LV  stroke volume. DVI 0.39.   Comparison(s): Compared to 2021 study, LVEF and AS are worse; prior study  not done in the setting of this tachycardia.   Cath 02/11/20: Conclusion    Ost LAD to Prox LAD lesion is 100% stenosed. Ost LM to Mid LM lesion is 100% stenosed. Prox RCA to Mid RCA lesion is 25% stenosed. RPDA lesion is 35% stenosed. SVG graft was visualized by angiography and is normal in caliber. The graft exhibits no disease. RIMA graft was visualized by angiography. LIMA graft was visualized by angiography and is normal in caliber. The graft exhibits no disease. The left ventricular systolic function is normal. LV end diastolic pressure is normal. The left ventricular ejection fraction is 55-65% by visual estimate.   1. Left main ostial occlusion 2. Nonobstructive disease in the RCA 3. Patent LIMA to the LAD 4. Atretic RIMA to the ramus intermediate. The Ramus intermediate fills retrograde from the LCx graft 5. Patent SVG to the OM 6. The right subclavian bypass is widely patent 7. Normal right heart pressures 8. Normal LV filling pressures 9. Normal cardiac output.    Plan: continue medical management.     Recent Labs: 02/16/2021: BNP 374.4 03/04/2021: Magnesium 2.0 06/29/2021: ALT 23; BUN 20; Creatinine, Ser 0.97; Hemoglobin 13.4; Platelets 191; Potassium 4.2; Sodium 139; TSH 1.720  CBC    Component Value Date/Time   WBC 5.8 06/29/2021 1056   WBC 4.9 05/13/2021 0537   RBC 4.28 06/29/2021 1056   RBC 3.87 (L) 05/13/2021 0537   HGB 13.4 06/29/2021 1056   HCT 40.0 06/29/2021 1056   PLT 191 06/29/2021 1056   MCV 94 06/29/2021 1056   MCH 31.3 06/29/2021 1056    MCH 31.8 05/13/2021 0537   MCHC 33.5 06/29/2021 1056   MCHC 32.6 05/13/2021 0537   RDW 13.9 06/29/2021 1056   LYMPHSABS 0.6 (L) 06/29/2021 1056   MONOABS 0.6 04/27/2021 1117   EOSABS 0.0 06/29/2021 1056   BASOSABS 0.0 06/29/2021 1056      Latest Ref Rng & Units 06/29/2021  10:56 AM 05/21/2021    1:06 PM 05/18/2021    3:07 PM  CMP  Glucose 70 - 99 mg/dL 94  96  100   BUN 8 - 27 mg/dL '20  18  23   '$ Creatinine 0.76 - 1.27 mg/dL 0.97  0.93  0.92   Sodium 134 - 144 mmol/L 139  137  137   Potassium 3.5 - 5.2 mmol/L 4.2  5.3  6.5   Chloride 96 - 106 mmol/L 101  101  100   CO2 20 - 29 mmol/L '23  22  24   '$ Calcium 8.6 - 10.2 mg/dL 8.8  8.9  9.4   Total Protein 6.0 - 8.5 g/dL 6.0   6.3   Total Bilirubin 0.0 - 1.2 mg/dL 1.1   1.3   Alkaline Phos 44 - 121 IU/L 210   214   AST 0 - 40 IU/L 29   31   ALT 0 - 44 IU/L 23   28    Lab Results  Component Value Date   INR 1.3 (H) 05/04/2021   INR 1.4 (H) 05/03/2021   INR 1.4 (H) 04/30/2021    Lipid Panel Lab Results  Component Value Date   CHOL 122 06/29/2021   HDL 56 06/29/2021   LDLCALC 56 06/29/2021   TRIG 39 06/29/2021   CHOLHDL 2.2 06/29/2021      Wt Readings from Last 3 Encounters:  06/29/21 169 lb (76.7 kg)  05/14/21 172 lb (78 kg)  05/04/21 163 lb 9.6 oz (74.2 kg)     Other studies Reviewed: Additional studies/ records that were reviewed today include: Office notes, hospital records and testing.  ASSESSMENT AND PLAN:  1.  DOE:  - EF  40-45% cath with normal right /left heart pressures 02/10/21 despite echo morphology Cors /grafts stable medical Rx   2.  Persistent atrial fibrillation - coumadin d/c due to fall risk and high INR;s   3.  Hypertension -continue losartan and beta blocker as well as lasix   4.  Carotid disease, history of subclavian disease -Duplex 11/07/19 with patent right CCA to subclavian bypass and plaque in L ICA graft patent at cath 02/11/20 as well  5. CAD/CABG:  stable anatomy on cath with   atretic RIMA to Ramus but fills from LCX system Medial Rx   6. AS:  at this point moderate but no plans for TAVR evaluation  7. Delirium / Dementia:  some improvement but significant check  f/u primary Aricept with no benefit   Patient is 25 with FTT and DNR I confirmed this with his niece today and also fact that family wants him to be comfortable and we would not pursue TAVR at any point    Disposition:   FU  in a year   Signed, [name], MD  09/07/2021 4:46 PM    [place] Phone: [phone]; Fax: [phone]

## 2021-09-09 ENCOUNTER — Ambulatory Visit: Payer: Medicare HMO | Admitting: Cardiovascular Disease

## 2021-09-29 ENCOUNTER — Encounter: Payer: Self-pay | Admitting: Family Medicine

## 2021-09-29 ENCOUNTER — Ambulatory Visit (INDEPENDENT_AMBULATORY_CARE_PROVIDER_SITE_OTHER): Payer: Medicare HMO | Admitting: Family Medicine

## 2021-09-29 VITALS — BP 81/47 | HR 100 | Temp 97.9°F | Resp 20 | Ht 74.0 in | Wt 153.0 lb

## 2021-09-29 DIAGNOSIS — R600 Localized edema: Secondary | ICD-10-CM

## 2021-09-29 DIAGNOSIS — F0390 Unspecified dementia without behavioral disturbance: Secondary | ICD-10-CM

## 2021-09-29 DIAGNOSIS — E782 Mixed hyperlipidemia: Secondary | ICD-10-CM

## 2021-09-29 DIAGNOSIS — E559 Vitamin D deficiency, unspecified: Secondary | ICD-10-CM

## 2021-09-29 DIAGNOSIS — I480 Paroxysmal atrial fibrillation: Secondary | ICD-10-CM | POA: Diagnosis not present

## 2021-09-29 DIAGNOSIS — I700 Atherosclerosis of aorta: Secondary | ICD-10-CM | POA: Diagnosis not present

## 2021-09-29 DIAGNOSIS — I10 Essential (primary) hypertension: Secondary | ICD-10-CM | POA: Diagnosis not present

## 2021-09-29 DIAGNOSIS — K219 Gastro-esophageal reflux disease without esophagitis: Secondary | ICD-10-CM

## 2021-09-29 MED ORDER — METOPROLOL SUCCINATE ER 25 MG PO TB24
25.0000 mg | ORAL_TABLET | Freq: Every day | ORAL | 2 refills | Status: DC
Start: 2021-09-29 — End: 2021-10-27

## 2021-09-29 NOTE — Patient Instructions (Signed)
I am decreasing metoprolol from 50 mg to 25 mg daily.

## 2021-09-29 NOTE — Progress Notes (Signed)
Assessment & Plan:  1. Essential hypertension Blood pressure is on the lower side today.  Metoprolol decreased from 50 mg to 25 mg daily.  Encouraged patient and his wife to assess for increased heart rate with this decrease. - CBC with Differential/Platelet - CMP14+EGFR - metoprolol succinate (TOPROL-XL) 25 MG 24 hr tablet; Take 1 tablet (25 mg total) by mouth daily.  Dispense: 30 tablet; Refill: 2  2. Thoracic aorta atherosclerosis (HCC) Continue aspirin and lovastatin.  3. Mixed hyperlipidemia Well controlled on current regimen.   4. Paroxysmal atrial fibrillation (HCC) Continue metoprolol for rate control.  Aspirin instead of a stronger anticoagulant as risk outweigh benefits.  5. Edema of both lower extremities Well controlled on current regimen.  - CMP14+EGFR  6. Gastroesophageal reflux disease, unspecified whether esophagitis present Well controlled on current regimen.  - CMP14+EGFR  7. Vitamin D deficiency Well controlled on current regimen.   13. Dementia, old age [facility]) Wife is able to take good care of patient at home.   Return in about 4 weeks (around 10/27/2021) for HTN with Rakes .  [name], MSN, APRN, FNP-C [facility]  Subjective:    Patient ID: Danny [name]., male    DOB: 1931/10/23, 88 y.o.   MRN: [id number]  Patient Care Team: Loman Brooklyn, FNP as PCP - General (Family Medicine) Josue Hector, MD as PCP - Cardiology (Cardiology) Josue Hector, MD as Attending Physician (Cardiology) [name], MD (Inactive) as Consulting Physician (Gastroenterology) Okey Regal, OD (Optometry) Rexene Alberts, MD (Inactive) as Consulting Physician (Cardiothoracic Surgery) Allyn Kenner, MD as Consulting Physician (Dermatology)   Chief Complaint:  Chief Complaint  Patient presents with   Medical Management of Chronic Issues    3 mo     HPI: Danny [name]. is a 88 y.o. male presenting on 09/29/2021 for Medical  Management of Chronic Issues (3 mo )  Patient is accompanied by his wife who he is okay with being present.  Hypertension/Atrial fibrillation: He is on metoprolol 50 mg daily for rate control. He is no longer on anticoagulation due to risks outweighing benefits. Warfarin was stopped in April 2023. He does not monitor his blood pressure at home. Denies dizziness and feeling lightheaded. He does feel more weak. He has done PT recently.   Hyperlipidemia/Thoracic aorta atherosclerosis: He is taking a statin and aspirin daily.   Peripheral edema: He takes lasix and wears compression hose.  GERD: Taking Prilosec daily.  Vitamin D Deficiency: Taking vitamin D daily. Last vitamin D level WNL on 06/29/2021.   Dementia: was previously prescribed Aricept 5 mg daily. His wife previously took him off of it as she didn't feel it made a difference. She is currently able to care for him at home.   New complaints: None   Social history:  Relevant past medical, surgical, family and social history reviewed and updated as indicated. Interim medical history since our last visit reviewed.  Allergies and medications reviewed and updated.  DATA REVIEWED: CHART IN EPIC  ROS: Negative unless specifically indicated above in HPI.    Current Outpatient Medications:    acetaminophen (TYLENOL) 325 MG tablet, Take 650 mg by mouth every 6 (six) hours as needed., Disp: , Rfl:    aspirin 81 MG chewable tablet, Chew 81 mg by mouth daily. atrial fibrillation, Disp: , Rfl:    cholecalciferol (VITAMIN D) 25 MCG (1000 UNIT) tablet, Take 1,000 Units by mouth daily., Disp: , Rfl:  Ensure (ENSURE), Take 1 Can by mouth as needed., Disp: , Rfl:    furosemide (LASIX) 20 MG tablet, Take 1 tablet (20 mg total) by mouth daily., Disp: 90 tablet, Rfl: 1   levocetirizine (XYZAL) 5 MG tablet, Take 1 tablet (5 mg total) by mouth every evening., Disp: 90 tablet, Rfl: 1   lovastatin (MEVACOR) 40 MG tablet, Take 1 tablet (40 mg total)  by mouth at bedtime., Disp: 30 tablet, Rfl: 0   metoprolol succinate (TOPROL-XL) 50 MG 24 hr tablet, Take 1 tablet (50 mg total) by mouth daily., Disp: 90 tablet, Rfl: 1   Multiple Vitamins-Minerals (PRESERVISION AREDS 2 PO), Take 1 tablet by mouth in the morning and at bedtime., Disp: , Rfl:    omeprazole (PRILOSEC) 20 MG capsule, Take 1 capsule (20 mg total) by mouth 2 (two) times daily before a meal., Disp: 180 capsule, Rfl: 3   potassium chloride SA (KLOR-CON M) 20 MEQ tablet, Take 2 tablets (40 mEq total) by mouth daily., Disp: 180 tablet, Rfl: 1   Probiotic Product (RISA-BID PROBIOTIC) TABS, Take by mouth. 1 billion cell- 250 mg; oral Once A Day - PRN Special Instructions: abdominal discomfort, Disp: , Rfl:    Allergies  Allergen Reactions   Doxycycline Diarrhea   Contrast Media [Iodinated Contrast Media]     Syncope   Lunesta [Eszopiclone]     "felt WILD"   Morphine And Related Nausea And Vomiting   Trazodone And Nefazodone Other (See Comments)    Extremely drowsy the next day after taking.   Past Medical History:  Diagnosis Date   Anxiety    Atrial fibrillation ([place])    CAD, ARTERY BYPASS GRAFT 05/19/2008   Qualifier: Diagnosis of  By: Mare Ferrari, RMA, Sherri     Enteritis due to Clostridium difficile, history of 11/21/2012   GERD (gastroesophageal reflux disease)    Hyperlipidemia    Hypertension    Internal carotid artery stenosis    Shingles 06/2020   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 01/14/2014   Vitamin D deficiency     Past Surgical History:  Procedure Laterality Date   CARDIOVERSION N/A 05/05/2016   Procedure: CARDIOVERSION;  Surgeon: Josue Hector, MD;  Location: [place];  Service: Cardiovascular;  Laterality: N/A;   CAROTID ENDARTERECTOMY Left 2003   CAROTID-SUBCLAVIAN BYPASS GRAFT Right    COLONOSCOPY  2005   hyperplastic polyp x 1 with no adenoma   COLONOSCOPY N/A 04/11/2014   BWI:OMBT diverticulosis in the sigmoid colon/left colon is  redundant   CORONARY ARTERY BYPASS GRAFT     ESOPHAGEAL DILATION N/A 04/11/2014   Procedure: ESOPHAGEAL DILATION;  Surgeon: [name], MD;  [facility];  Service: Endoscopy;  Laterality: N/A;   ESOPHAGOGASTRODUODENOSCOPY N/A 04/11/2014   DHR:CBULAGTXM at the gastro junction/moderate erosive/duodenal web   EYE SURGERY Bilateral    cataracts   RIGHT/LEFT HEART CATH AND CORONARY/GRAFT ANGIOGRAPHY N/A 02/11/2020   Procedure: RIGHT/LEFT HEART CATH AND CORONARY/GRAFT ANGIOGRAPHY;  Surgeon: Martinique, Peter M, MD;  Location: [place] [facility];  Service: Cardiovascular;  Laterality: N/A;   SKIN CANCER EXCISION      Social History   Socioeconomic History   Marital status: Married    Spouse name: ruby   Number of children: 1   Years of education: Not on file   Highest education level: Not on file  Occupational History   Occupation: retired    Fish farm manager: [employer]  Tobacco Use   Smoking status: Former  Types: Cigarettes    Start date: 01/24/1950    Quit date: 01/24/1961    Years since quitting: 60.7   Smokeless tobacco: Never  Vaping Use   Vaping Use: Never used  Substance and Sexual Activity   Alcohol use: Not Currently    Alcohol/week: 0.0 standard drinks of alcohol    Comment: not drank since 12/2009   Drug use: No   Sexual activity: Yes    Partners: Female  Other Topics Concern   Not on file  Social History Narrative   Not on file   Social Determinants of Health   Financial Resource Strain: Not on file  Food Insecurity: Not on file  Transportation Needs: Not on file  Physical Activity: Not on file  Stress: Not on file  Social Connections: Not on file  Intimate Partner Violence: Not on file        Objective:    BP (!) 81/47   Pulse 100   Temp 97.9 F (36.6 C)   Resp 20   Ht _0  (1.88 m)   Wt 153 lb (69.4 kg)   SpO2 96%   BMI 19.64 kg/m   Wt Readings from Last 3 Encounters:  09/29/21 153 lb (69.4 kg)  06/29/21 169 lb (76.7 kg)  05/14/21 172 lb (78  kg)    Physical Exam Vitals reviewed.  Constitutional:      General: He is not in acute distress.    Appearance: Normal appearance. He is underweight. He is not ill-appearing, toxic-appearing or diaphoretic.  HENT:     Head: Normocephalic and atraumatic.     Right Ear: Tympanic membrane, ear canal and external ear normal. There is no impacted cerumen.     Left Ear: Tympanic membrane, ear canal and external ear normal. There is no impacted cerumen.     Nose: Nose normal. No congestion or rhinorrhea.     Mouth/Throat:     Mouth: Mucous membranes are moist.     Pharynx: Oropharynx is clear. No oropharyngeal exudate or posterior oropharyngeal erythema.  Eyes:     General: No scleral icterus.       Right eye: No discharge.        Left eye: No discharge.     Conjunctiva/sclera: Conjunctivae normal.     Pupils: Pupils are equal, round, and reactive to light.  Neck:     Vascular: No carotid bruit.  Cardiovascular:     Rate and Rhythm: Normal rate. Rhythm irregular.     Heart sounds: Murmur heard.     No friction rub. No gallop.  Pulmonary:     Effort: Pulmonary effort is normal. No respiratory distress.     Breath sounds: Normal breath sounds. No stridor. No wheezing, rhonchi or rales.  Abdominal:     General: Abdomen is flat. Bowel sounds are normal. There is no distension.     Palpations: Abdomen is soft. There is no hepatomegaly, splenomegaly or mass.     Tenderness: There is no abdominal tenderness. There is no guarding or rebound.     Hernia: No hernia is present.  Musculoskeletal:        General: Normal range of motion.     Cervical back: Normal range of motion and neck supple. No rigidity. No muscular tenderness.     Right lower leg: No edema.     Left lower leg: No edema.  Lymphadenopathy:     Cervical: No cervical adenopathy.  Skin:    General: Skin is warm and dry.  Capillary Refill: Capillary refill takes less than 2 seconds.  Neurological:     General: No focal  deficit present.     Mental Status: He is alert and oriented to person, place, and time. Mental status is at baseline.     Motor: Weakness present.     Gait: Gait abnormal (riding in [facility]).  Psychiatric:        Mood and Affect: Mood normal.        Behavior: Behavior normal.        Thought Content: Thought content normal.        Judgment: Judgment normal.     Lab Results  Component Value Date   TSH 1.720 06/29/2021   Lab Results  Component Value Date   WBC 5.8 06/29/2021   HGB 13.4 06/29/2021   HCT 40.0 06/29/2021   MCV 94 06/29/2021   PLT 191 06/29/2021   Lab Results  Component Value Date   NA 139 06/29/2021   K 4.2 06/29/2021   CO2 23 06/29/2021   GLUCOSE 94 06/29/2021   BUN 20 06/29/2021   CREATININE 0.97 06/29/2021   BILITOT 1.1 06/29/2021   ALKPHOS 210 (H) 06/29/2021   AST 29 06/29/2021   ALT 23 06/29/2021   PROT 6.0 06/29/2021   ALBUMIN 3.5 (L) 06/29/2021   CALCIUM 8.8 06/29/2021   ANIONGAP 7 05/13/2021   EGFR 75 06/29/2021   Lab Results  Component Value Date   CHOL 122 06/29/2021   Lab Results  Component Value Date   HDL 56 06/29/2021   Lab Results  Component Value Date   LDLCALC 56 06/29/2021   Lab Results  Component Value Date   TRIG 39 06/29/2021   Lab Results  Component Value Date   CHOLHDL 2.2 06/29/2021   No results found for: "HGBA1C"

## 2021-09-30 LAB — CBC WITH DIFFERENTIAL/PLATELET
Basophils Absolute: 0.1 10*3/uL (ref 0.0–0.2)
Basos: 1 %
EOS (ABSOLUTE): 0 10*3/uL (ref 0.0–0.4)
Eos: 1 %
Hematocrit: 41.4 % (ref 37.5–51.0)
Hemoglobin: 13.5 g/dL (ref 13.0–17.7)
Immature Grans (Abs): 0 10*3/uL (ref 0.0–0.1)
Immature Granulocytes: 0 %
Lymphocytes Absolute: 1 10*3/uL (ref 0.7–3.1)
Lymphs: 17 %
MCH: 30.2 pg (ref 26.6–33.0)
MCHC: 32.6 g/dL (ref 31.5–35.7)
MCV: 93 fL (ref 79–97)
Monocytes Absolute: 0.7 10*3/uL (ref 0.1–0.9)
Monocytes: 12 %
Neutrophils Absolute: 4.1 10*3/uL (ref 1.4–7.0)
Neutrophils: 69 %
Platelets: 228 10*3/uL (ref 150–450)
RBC: 4.47 x10E6/uL (ref 4.14–5.80)
RDW: 14.8 % (ref 11.6–15.4)
WBC: 5.9 10*3/uL (ref 3.4–10.8)

## 2021-09-30 LAB — CMP14+EGFR
ALT: 39 IU/L (ref 0–44)
AST: 41 IU/L — ABNORMAL HIGH (ref 0–40)
Albumin/Globulin Ratio: 1.5 (ref 1.2–2.2)
Albumin: 4 g/dL (ref 3.7–4.7)
Alkaline Phosphatase: 216 IU/L — ABNORMAL HIGH (ref 44–121)
BUN/Creatinine Ratio: 19 (ref 10–24)
BUN: 18 mg/dL (ref 8–27)
Bilirubin Total: 1.2 mg/dL (ref 0.0–1.2)
CO2: 27 mmol/L (ref 20–29)
Calcium: 9.3 mg/dL (ref 8.6–10.2)
Chloride: 92 mmol/L — ABNORMAL LOW (ref 96–106)
Creatinine, Ser: 0.94 mg/dL (ref 0.76–1.27)
Globulin, Total: 2.7 g/dL (ref 1.5–4.5)
Glucose: 105 mg/dL — ABNORMAL HIGH (ref 70–99)
Potassium: 4.5 mmol/L (ref 3.5–5.2)
Sodium: 131 mmol/L — ABNORMAL LOW (ref 134–144)
Total Protein: 6.7 g/dL (ref 6.0–8.5)
eGFR: 78 mL/min/{1.73_m2} (ref >59)

## 2021-10-18 ENCOUNTER — Other Ambulatory Visit: Payer: Self-pay | Admitting: Family Medicine

## 2021-10-18 DIAGNOSIS — R600 Localized edema: Secondary | ICD-10-CM

## 2021-10-27 ENCOUNTER — Encounter: Payer: Self-pay | Admitting: Family Medicine

## 2021-10-27 ENCOUNTER — Ambulatory Visit (INDEPENDENT_AMBULATORY_CARE_PROVIDER_SITE_OTHER): Payer: Medicare HMO | Admitting: Family Medicine

## 2021-10-27 VITALS — BP 114/64 | HR 87 | Temp 98.3°F | Ht 74.0 in | Wt 162.0 lb

## 2021-10-27 DIAGNOSIS — Z951 Presence of aortocoronary bypass graft: Secondary | ICD-10-CM

## 2021-10-27 DIAGNOSIS — Z23 Encounter for immunization: Secondary | ICD-10-CM

## 2021-10-27 DIAGNOSIS — I700 Atherosclerosis of aorta: Secondary | ICD-10-CM | POA: Diagnosis not present

## 2021-10-27 DIAGNOSIS — I10 Essential (primary) hypertension: Secondary | ICD-10-CM | POA: Diagnosis not present

## 2021-10-27 DIAGNOSIS — I480 Paroxysmal atrial fibrillation: Secondary | ICD-10-CM | POA: Diagnosis not present

## 2021-10-27 MED ORDER — METOPROLOL SUCCINATE ER 25 MG PO TB24: 25.0000 mg | ORAL_TABLET | Freq: Every day | ORAL | 2 refills | Status: AC

## 2021-10-27 NOTE — Patient Instructions (Signed)

## 2021-10-27 NOTE — Progress Notes (Signed)
Subjective:  Patient ID: Danny Loron., male    DOB: February 05, 1931, 88 y.o.   MRN: [id number]  Patient Care Team: Baruch Gouty, FNP as PCP - General (Family Medicine) Josue Hector, MD as PCP - Cardiology (Cardiology) Josue Hector, MD as Attending Physician (Cardiology) [name], MD (Inactive) as Consulting Physician (Gastroenterology) Okey Regal, OD (Optometry) Rexene Alberts, MD (Inactive) as Consulting Physician (Cardiothoracic Surgery) Allyn Kenner, MD as Consulting Physician (Dermatology)   Chief Complaint:  Medical Management of Chronic Issues   HPI: Danny [name]. is a 88 y.o. male presenting on 10/27/2021 for Medical Management of Chronic Issues   1. Essential hypertension Pt following up today for reevaluation of blood pressure after his metoprolol dosing was decreased to 25 mg daily. He states he has tolerated this change well. Denies palpitations, dizziness, weakness, near syncope or syncope, chest pain, or leg swelling. No headaches or visual changes.   He is s/p CABG and reports it is difficult to travel to [place] to see the cardiologist, would like referral to see Dr. Percival Spanish in [place].    Relevant past medical, surgical, family, and social history reviewed and updated as indicated.  Allergies and medications reviewed and updated. Data reviewed: Chart in Epic.   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 01/14/2014   Vitamin D deficiency     Past Surgical History:  Procedure Laterality Date   CARDIOVERSION N/A 05/05/2016   Procedure: CARDIOVERSION;  Surgeon:  Josue Hector, MD;  Location: [facility];  Service: Cardiovascular;  Laterality: N/A;   CAROTID ENDARTERECTOMY Left 2003   CAROTID-SUBCLAVIAN BYPASS GRAFT Right    COLONOSCOPY  2005   hyperplastic polyp x 1 with no adenoma   COLONOSCOPY N/A 04/11/2014   YYF:RTMY diverticulosis in the sigmoid colon/left colon is redundant   CORONARY ARTERY BYPASS GRAFT     ESOPHAGEAL DILATION N/A 04/11/2014   Procedure: ESOPHAGEAL DILATION;  Surgeon: [name], MD;  [facility];  Service: Endoscopy;  Laterality: N/A;   ESOPHAGOGASTRODUODENOSCOPY N/A 04/11/2014   TRZ:NBVAPOLID at the gastro junction/moderate erosive/duodenal web   EYE SURGERY Bilateral    cataracts   RIGHT/LEFT HEART CATH AND CORONARY/GRAFT ANGIOGRAPHY N/A 02/11/2020   Procedure: RIGHT/LEFT HEART CATH AND CORONARY/GRAFT ANGIOGRAPHY;  Surgeon: Martinique, Peter M, MD;  Location: [facility];  Service: Cardiovascular;  Laterality: N/A;   SKIN CANCER EXCISION      Social History   Socioeconomic History   Marital status: Married    Spouse name: ruby   Number of children: 1   Years of education: Not on file   Highest education level: Not on file  Occupational History   Occupation: retired    Fish farm manager: [employer]  Tobacco Use   Smoking status: Former    Types: Cigarettes    Start date: 01/24/1950    Quit date: 01/24/1961    Years since quitting: 60.7   Smokeless tobacco: Never  Vaping Use   Vaping Use: Never used  Substance and Sexual Activity  Alcohol use: Not Currently    Alcohol/week: 0.0 standard drinks of alcohol    Comment: not drank since 12/2009   Drug use: No   Sexual activity: Yes    Partners: Female  Other Topics Concern   Not on file  Social History Narrative   Not on file   Social Determinants of Health   Financial Resource Strain: Not on file  Food Insecurity: Not on file  Transportation Needs: Not on file  Physical Activity: Not on file  Stress: Not on file  Social Connections: Not on file   Intimate Partner Violence: Not on file    Outpatient Encounter Medications as of 10/27/2021  Medication Sig   acetaminophen (TYLENOL) 325 MG tablet Take 650 mg by mouth every 6 (six) hours as needed.   aspirin 81 MG chewable tablet Chew 81 mg by mouth daily. atrial fibrillation   cholecalciferol (VITAMIN D) 25 MCG (1000 UNIT) tablet Take 1,000 Units by mouth daily.   Ensure (ENSURE) Take 1 Can by mouth as needed.   furosemide (LASIX) 20 MG tablet TAKE 1 TABLET EVERY DAY   levocetirizine (XYZAL) 5 MG tablet Take 1 tablet (5 mg total) by mouth every evening.   lovastatin (MEVACOR) 40 MG tablet Take 1 tablet (40 mg total) by mouth at bedtime.   Multiple Vitamins-Minerals (PRESERVISION AREDS 2 PO) Take 1 tablet by mouth in the morning and at bedtime.   omeprazole (PRILOSEC) 20 MG capsule Take 1 capsule (20 mg total) by mouth 2 (two) times daily before a meal.   potassium chloride SA (KLOR-CON M) 20 MEQ tablet Take 2 tablets (40 mEq total) by mouth daily.   Probiotic Product (RISA-BID PROBIOTIC) TABS Take by mouth. 1 billion cell- 250 mg; oral Once A Day - PRN Special Instructions: abdominal discomfort   [DISCONTINUED] metoprolol succinate (TOPROL-XL) 25 MG 24 hr tablet Take 1 tablet (25 mg total) by mouth daily.   metoprolol succinate (TOPROL-XL) 25 MG 24 hr tablet Take 1 tablet (25 mg total) by mouth daily.   No facility-administered encounter medications on file as of 10/27/2021.    Allergies  Allergen Reactions   Doxycycline Diarrhea   Contrast Media [Iodinated Contrast Media]     Syncope   Lunesta [Eszopiclone]     "felt WILD"   Morphine And Related Nausea And Vomiting   Trazodone And Nefazodone Other (See Comments)    Extremely drowsy the next day after taking.    Review of Systems  Constitutional:  Negative for activity change, appetite change, chills, diaphoresis, fatigue, fever and unexpected weight change.  HENT: Negative.    Eyes: Negative.  Negative for photophobia and  visual disturbance.  Respiratory:  Negative for cough, chest tightness and shortness of breath.   Cardiovascular:  Negative for chest pain, palpitations and leg swelling.  Gastrointestinal:  Negative for abdominal pain, blood in stool, constipation, diarrhea, nausea and vomiting.  Endocrine: Negative.   Genitourinary:  Negative for decreased urine volume, difficulty urinating, dysuria, frequency and urgency.  Musculoskeletal:  Negative for arthralgias and myalgias.  Skin: Negative.   Allergic/Immunologic: Negative.   Neurological:  Negative for dizziness, tremors, seizures, syncope, facial asymmetry, speech difficulty, weakness, light-headedness, numbness and headaches.  Hematological: Negative.   Psychiatric/Behavioral:  Negative for hallucinations, sleep disturbance and suicidal ideas.   All other systems reviewed and are negative.       Objective:  BP 114/64   Pulse 87   Temp 98.3 F (36.8 C)   Ht 6' 2"  (1.88 m)  Wt 162 lb (73.5 kg)   SpO2 98%   BMI 20.80 kg/m    Wt Readings from Last 3 Encounters:  10/27/21 162 lb (73.5 kg)  09/29/21 153 lb (69.4 kg)  06/29/21 169 lb (76.7 kg)    Physical Exam Vitals and nursing note reviewed.  Constitutional:      General: He is not in acute distress.    Appearance: He is normal weight. He is not ill-appearing, toxic-appearing or diaphoretic.     Comments: Frail elderly  HENT:     Head: Normocephalic and atraumatic.     Mouth/Throat:     Mouth: Mucous membranes are moist.  Eyes:     Pupils: Pupils are equal, round, and reactive to light.  Cardiovascular:     Rate and Rhythm: Normal rate. Rhythm irregular.     Heart sounds: Murmur heard.     Systolic murmur is present with a grade of 3/6.  Pulmonary:     Effort: Pulmonary effort is normal.     Breath sounds: Normal breath sounds.  Musculoskeletal:     Right lower leg: No edema.     Left lower leg: No edema.  Skin:    General: Skin is warm and dry.     Capillary Refill:  Capillary refill takes less than 2 seconds.  Neurological:     Mental Status: He is alert. Mental status is at baseline.     Gait: Gait abnormal (in wheelchair).  Psychiatric:        Attention and Perception: Attention normal.        Mood and Affect: Mood and affect normal.        Speech: Speech normal.        Behavior: Behavior is cooperative.        Cognition and Memory: Cognition is impaired. Memory is impaired.     Results for orders placed or performed in visit on 09/29/21  CBC with Differential/Platelet  Result Value Ref Range   WBC 5.9 3.4 - 10.8 x10E3/uL   RBC 4.47 4.14 - 5.80 x10E6/uL   Hemoglobin 13.5 13.0 - 17.7 g/dL   Hematocrit 41.4 37.5 - 51.0 %   MCV 93 79 - 97 fL   MCH 30.2 26.6 - 33.0 pg   MCHC 32.6 31.5 - 35.7 g/dL   RDW 14.8 11.6 - 15.4 %   Platelets 228 150 - 450 x10E3/uL   Neutrophils 69 Not Estab. %   Lymphs 17 Not Estab. %   Monocytes 12 Not Estab. %   Eos 1 Not Estab. %   Basos 1 Not Estab. %   Neutrophils Absolute 4.1 1.4 - 7.0 x10E3/uL   Lymphocytes Absolute 1.0 0.7 - 3.1 x10E3/uL   Monocytes Absolute 0.7 0.1 - 0.9 x10E3/uL   EOS (ABSOLUTE) 0.0 0.0 - 0.4 x10E3/uL   Basophils Absolute 0.1 0.0 - 0.2 x10E3/uL   Immature Granulocytes 0 Not Estab. %   Immature Grans (Abs) 0.0 0.0 - 0.1 x10E3/uL  CMP14+EGFR  Result Value Ref Range   Glucose 105 (H) 70 - 99 mg/dL   BUN 18 8 - 27 mg/dL   Creatinine, Ser 0.94 0.76 - 1.27 mg/dL   eGFR 78 >59 mL/min/1.73   BUN/Creatinine Ratio 19 10 - 24   Sodium 131 (L) 134 - 144 mmol/L   Potassium 4.5 3.5 - 5.2 mmol/L   Chloride 92 (L) 96 - 106 mmol/L   CO2 27 20 - 29 mmol/L   Calcium 9.3 8.6 - 10.2 mg/dL   Total  Protein 6.7 6.0 - 8.5 g/dL   Albumin 4.0 3.7 - 4.7 g/dL   Globulin, Total 2.7 1.5 - 4.5 g/dL   Albumin/Globulin Ratio 1.5 1.2 - 2.2   Bilirubin Total 1.2 0.0 - 1.2 mg/dL   Alkaline Phosphatase 216 (H) 44 - 121 IU/L   AST 41 (H) 0 - 40 IU/L   ALT 39 0 - 44 IU/L       Pertinent labs & imaging results  that were available during my care of the patient were reviewed by me and considered in my medical decision making.  Assessment & Plan:  Danny [name] was seen today for medical management of chronic issues.  Diagnoses and all orders for this visit:  Essential hypertension Doing well on decreased dosing of metoprolol, will continue. DASH diet discussed. -     metoprolol succinate (TOPROL-XL) 25 MG 24 hr tablet; Take 1 tablet (25 mg total) by mouth daily. -     Ambulatory referral to Cardiology  Thoracic aorta atherosclerosis [facility]) Paroxysmal atrial fibrillation ([place]) Hx of CABG Last saw Dr. Johnsie Cancel in February 2023, unable to travel to [place] any longer. Would like referral to [facility]. Referral placed.  -     Ambulatory referral to Cardiology     Continue all other maintenance medications.  Follow up plan: Return in about 3 months (around 01/27/2022), or if symptoms worsen or fail to improve, for chronic follow up.   Continue healthy lifestyle choices, including diet (rich in fruits, vegetables, and lean proteins, and low in salt and simple carbohydrates) and exercise (at least 30 minutes of moderate physical activity daily).  Educational handout given for DASH diet and HTN  The above assessment and management plan was discussed with the patient. The patient verbalized understanding of and has agreed to the management plan. Patient is aware to call the clinic if they develop any new symptoms or if symptoms persist or worsen. Patient is aware when to return to the clinic for a follow-up visit. Patient educated on when it is appropriate to go to the emergency department.   Monia Pouch, FNP-C [facility] [phone]

## 2021-10-29 ENCOUNTER — Telehealth: Payer: Self-pay | Admitting: Cardiovascular Disease

## 2021-10-29 NOTE — Telephone Encounter (Signed)
  Pt's wife requesting to switch pt's cardiologist from Dr. Johnsie Cancel to Dr. Johnney Ou. Pt is closer to [place] office and cant drive to [place] anymore

## 2021-11-02 ENCOUNTER — Other Ambulatory Visit: Payer: Self-pay | Admitting: Family Medicine

## 2021-11-02 DIAGNOSIS — I700 Atherosclerosis of aorta: Secondary | ICD-10-CM

## 2021-11-02 DIAGNOSIS — E7800 Pure hypercholesterolemia, unspecified: Secondary | ICD-10-CM

## 2021-11-14 NOTE — Progress Notes (Unsigned)
Cardiology Office Note   Date:  11/17/2021   ID:  Danny [name]., DOB 21-Feb-1931, MRN [id number]  PCP:  Baruch Gouty, FNP  Cardiologist:   Minus Breeding, MD   Chief Complaint  Patient presents with   Shortness of Breath      History of Present Illness: Danny [name]. is a 88 y.o. male who presents for follow up of CAD status post CABG in 2003 with LIMA-LAD, RIMA-RI, SVG-OM3.  He persistent atrial fib.  He has had severe RV dysfunction with severe TR.  He has moderate AS and mildly reduced LVEF.  He is switching his care because he lives up.  His wife is my patient.  He has been in fibrillation but deemed not to be an anticoagulation candidate because of fall risk.  He has been on amiodarone only for rate control.  He has not wanted further cardioversion.  He has known significant valve disease as mentioned and this is being managed conservatively.  He has some dementia and is a DNR.  He was actually hospitalized with delirium in January.  His wife takes care of him.  Actually yesterday he drove her to the store and on the way back he was so weak that he had to sit on the stairs coming back in and EMS had to be called to help prevent.  He did not lose consciousness.  She has had progressive leg weakness.  He has lower extremity swelling which she thinks is a little bit more than previous.  She feeds him to manage his because of his low sodium.  He is not having any new shortness of breath, PND or orthopnea.  He does notice his atrial fibrillation but he is not having any palpitations, presyncope or syncope.  He denies any chest pain.  He does seem to have some memory issues.  He scoots around his house in his wheelchair.   Past Medical History:  Diagnosis Date   Anxiety    Atrial fibrillation ([place])    CAD, ARTERY BYPASS GRAFT 05/19/2008   Qualifier: Diagnosis of  By: Mare Ferrari, RMA, Sherri     Enteritis due to Clostridium difficile, history of 11/21/2012   GERD (gastroesophageal  reflux disease)    Hyperlipidemia    Hypertension    Internal carotid artery stenosis    Shingles 06/2020   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 01/14/2014   Vitamin D deficiency     Past Surgical History:  Procedure Laterality Date   CARDIOVERSION N/A 05/05/2016   Procedure: CARDIOVERSION;  Surgeon: Josue Hector, MD;  Location: [facility];  Service: Cardiovascular;  Laterality: N/A;   CAROTID ENDARTERECTOMY Left 2003   CAROTID-SUBCLAVIAN BYPASS GRAFT Right    COLONOSCOPY  2005   hyperplastic polyp x 1 with no adenoma   COLONOSCOPY N/A 04/11/2014   NLG:XQJJ diverticulosis in the sigmoid colon/left colon is redundant   CORONARY ARTERY BYPASS GRAFT     ESOPHAGEAL DILATION N/A 04/11/2014   Procedure: ESOPHAGEAL DILATION;  Surgeon: [name], MD;  [facility];  Service: Endoscopy;  Laterality: N/A;   ESOPHAGOGASTRODUODENOSCOPY N/A 04/11/2014   HER:[id number] at the gastro junction/moderate erosive/duodenal web   EYE SURGERY Bilateral    cataracts   RIGHT/LEFT HEART CATH AND CORONARY/GRAFT ANGIOGRAPHY N/A 02/11/2020   Procedure: RIGHT/LEFT HEART CATH AND CORONARY/GRAFT ANGIOGRAPHY;  Surgeon: Martinique, Peter M, MD;  Location: [facility];  Service: Cardiovascular;  Laterality: N/A;   SKIN  CANCER EXCISION       Current Outpatient Medications  Medication Sig Dispense Refill   acetaminophen (TYLENOL) 325 MG tablet Take 650 mg by mouth every 6 (six) hours as needed.     aspirin 81 MG chewable tablet Chew 81 mg by mouth daily. atrial fibrillation     cholecalciferol (VITAMIN D) 25 MCG (1000 UNIT) tablet Take 1,000 Units by mouth daily.     Ensure (ENSURE) Take 1 Can by mouth as needed.     furosemide (LASIX) 20 MG tablet TAKE 1 TABLET EVERY DAY 90 tablet 1   levocetirizine (XYZAL) 5 MG tablet TAKE 1 TABLET EVERY EVENING 90 tablet 1   metoprolol succinate (TOPROL-XL) 25 MG 24 hr tablet Take 1 tablet (25 mg total) by mouth daily. 90 tablet 2   Multiple  Vitamins-Minerals (PRESERVISION AREDS 2 PO) Take 1 tablet by mouth in the morning and at bedtime.     omeprazole (PRILOSEC) 20 MG capsule Take 1 capsule (20 mg total) by mouth 2 (two) times daily before a meal. 180 capsule 3   potassium chloride SA (KLOR-CON M) 20 MEQ tablet Take 2 tablets (40 mEq total) by mouth daily. 180 tablet 1   Probiotic Product (RISA-BID PROBIOTIC) TABS Take by mouth. 1 billion cell- 250 mg; oral Once A Day - PRN Special Instructions: abdominal discomfort     No current facility-administered medications for this visit.    Allergies:   Doxycycline, Contrast media [iodinated contrast media], Lunesta [eszopiclone], Morphine and related, and Trazodone and nefazodone     ROS:  Please see the history of present illness.   Otherwise, review of systems are positive for none.   All other systems are reviewed and negative.    PHYSICAL EXAM: VS:  BP 110/60   Pulse 92   Ht '6\' 2"'$  (1.88 m)   Wt 175 lb (79.4 kg)   BMI 22.47 kg/m  , BMI Body mass index is 22.47 kg/m. GENERAL:  Well appearing HEENT:  Pupils equal round and reactive, fundi not visualized, oral mucosa unremarkable NECK:  Positive jugular venous distention with CV wave, waveform within normal limits, carotid upstroke brisk and symmetric, no bruits, no thyromegaly LYMPHATICS:  No cervical, inguinal adenopathy LUNGS:  Clear to auscultation bilaterally BACK:  No CVA tenderness CHEST:  Well healed sternotomy scar. HEART:  PMI not displaced or sustained,S1 and S2 within normal limits, no S3,  no clicks, no rubs, holosystolic systolic murmur heard at the third left intercostal space murmurs, irregular ABD:  Flat, positive bowel sounds normal in frequency in pitch, no bruits, no rebound, no guarding, no midline pulsatile mass, no hepatomegaly, no splenomegaly EXT:  2 plus pulses throughout, severe bilateral edema, no cyanosis no clubbing SKIN:  No rashes no nodules NEURO:  Cranial nerves II through XII grossly  intact, motor grossly intact throughout PSYCH:  Cognitively intact, oriented to person place and time  Cath 02/11/2020  Diagnostic Dominance: Right     EKG:  EKG is ordered today. The ekg ordered today demonstrates atrial fibrillation, rate 91, axis within normal limits, intervals within normal limits, poor anterior R wave progression, premature ventricular contractions.   Recent Labs: 02/16/2021: BNP 374.4 03/04/2021: Magnesium 2.0 06/29/2021: TSH 1.720 09/29/2021: ALT 39; BUN 18; Creatinine, Ser 0.94; Hemoglobin 13.5; Platelets 228; Potassium 4.5; Sodium 131    Lipid Panel    Component Value Date/Time   CHOL 122 06/29/2021 1056   CHOL 116 05/25/2012 0832   TRIG 39 06/29/2021 1056   TRIG 59  10/29/2015 1133   TRIG 53 05/25/2012 0832   HDL 56 06/29/2021 1056   HDL 79 10/29/2015 1133   HDL 63 05/25/2012 0832   CHOLHDL 2.2 06/29/2021 1056   LDLCALC 56 06/29/2021 1056   LDLCALC 109 (H) 09/12/2013 0811   LDLCALC 42 05/25/2012 0832      Wt Readings from Last 3 Encounters:  11/17/21 175 lb (79.4 kg)  10/27/21 162 lb (73.5 kg)  09/29/21 153 lb (69.4 kg)      Other studies Reviewed: Additional studies/ records that were reviewed today include: Previous records condition. Review of the above records demonstrates:  Please see elsewhere in the note.     ASSESSMENT AND PLAN:    Persistent atrial fib: He seems to have good rate control.  He is not an anticoagulation candidate.  No change in therapy.  Hypertension: The blood pressure is controlled.  I will be making med changes as below.  CAD: He has coronary artery disease with his last catheterization 2022.  Medical management.  Disease as listed above..  Aortic stenosis/tricuspid regurgitation: He has aortic stenosis.  However, he is frail and not a candidate for intervention.  No change in therapy.  RV dysfunction: He has some significant edema which I think is increased although it is hard to tell.  His weight seems to  be up a little bit though this is a different scale.  I am just can give him Lasix for 4 days at 40 mg and recheck a basic metabolic profile in about a week.  I realize that his sodium was low but I want him to stop drinking tomato juice.  Dementia:   He has a memory disorder.  He has limited help at home.  I will defer to his primary provider as to whether he qualifies for any assistance at home.  I wonder if his wife would be helped by Meals on Wheels.  Leg weakness: I am going to his stop Mevacor.  I doubt that this is contributing but I would like him to be on fewer medicines.  DNR  Current medicines are reviewed at length with the patient today.  The patient does not have concerns regarding medicines.  The following changes have been made: As above  Labs/ tests ordered today include:   Orders Placed This Encounter  Procedures   Basic metabolic panel   EKG [id number]     Disposition:   FU with me in about 3 months   Signed, Minus Breeding, MD  11/17/2021 2:58 PM    [place]

## 2021-11-16 ENCOUNTER — Other Ambulatory Visit: Payer: Self-pay | Admitting: Family Medicine

## 2021-11-17 ENCOUNTER — Other Ambulatory Visit: Payer: Self-pay | Admitting: *Deleted

## 2021-11-17 ENCOUNTER — Encounter: Payer: Self-pay | Admitting: Cardiology

## 2021-11-17 ENCOUNTER — Ambulatory Visit: Payer: Medicare HMO | Admitting: Cardiology

## 2021-11-17 VITALS — BP 110/60 | HR 92 | Ht 74.0 in | Wt 175.0 lb

## 2021-11-17 DIAGNOSIS — I4819 Other persistent atrial fibrillation: Secondary | ICD-10-CM

## 2021-11-17 DIAGNOSIS — Z79899 Other long term (current) drug therapy: Secondary | ICD-10-CM | POA: Diagnosis not present

## 2021-11-17 DIAGNOSIS — R0609 Other forms of dyspnea: Secondary | ICD-10-CM | POA: Diagnosis not present

## 2021-11-17 DIAGNOSIS — I2510 Atherosclerotic heart disease of native coronary artery without angina pectoris: Secondary | ICD-10-CM | POA: Diagnosis not present

## 2021-11-17 DIAGNOSIS — I10 Essential (primary) hypertension: Secondary | ICD-10-CM

## 2021-11-17 NOTE — Patient Instructions (Signed)
Medication Instructions:  Please discontinue your Mevacor. Increase Furosemide to 40 mg a day for 4 days then resume 20 mg once a day. Continue all other medications as listed.  *If you need a refill on your cardiac medications before your next appointment, please call your pharmacy*  Lab Work: Please have blood work in 2 weeks at [facility] ([facility])  If you have labs (blood work) drawn today and your tests are completely normal, you will receive your results only by: [place] (if you have MyChart) OR A paper copy in the mail If you have any lab test that is abnormal or we need to change your treatment, we will call you to review the results.  Follow-Up: At [facility], you and your health needs are our priority.  As part of our continuing mission to provide you with exceptional heart care, we have created designated Provider Care Teams.  These Care Teams include your primary Cardiologist (physician) and Advanced Practice Providers (APPs -  Physician Assistants and Nurse Practitioners) who all work together to provide you with the care you need, when you need it.  We recommend signing up for the patient portal called "MyChart".  Sign up information is provided on this After Visit Summary.  MyChart is used to connect with patients for Virtual Visits (Telemedicine).  Patients are able to view lab/test results, encounter notes, upcoming appointments, etc.  Non-urgent messages can be sent to your provider as well.   To learn more about what you can do with MyChart, go to [email or web address].    Your next appointment:   3 month(s)  The format for your next appointment:   In Person  Provider:   Dr Minus Breeding    Important Information About Sugar

## 2021-11-23 ENCOUNTER — Ambulatory Visit: Payer: Medicare HMO | Admitting: Nurse Practitioner

## 2021-11-24 ENCOUNTER — Encounter: Payer: Self-pay | Admitting: Family Medicine

## 2021-11-24 ENCOUNTER — Ambulatory Visit (INDEPENDENT_AMBULATORY_CARE_PROVIDER_SITE_OTHER): Payer: Medicare HMO | Admitting: Family Medicine

## 2021-11-24 VITALS — BP 122/85 | HR 100 | Temp 97.1°F | Ht 74.0 in | Wt 178.0 lb

## 2021-11-24 DIAGNOSIS — I872 Venous insufficiency (chronic) (peripheral): Secondary | ICD-10-CM

## 2021-11-24 DIAGNOSIS — R609 Edema, unspecified: Secondary | ICD-10-CM

## 2021-11-24 DIAGNOSIS — I5023 Acute on chronic systolic (congestive) heart failure: Secondary | ICD-10-CM

## 2021-11-24 DIAGNOSIS — I83019 Varicose veins of right lower extremity with ulcer of unspecified site: Secondary | ICD-10-CM

## 2021-11-24 DIAGNOSIS — L97919 Non-pressure chronic ulcer of unspecified part of right lower leg with unspecified severity: Secondary | ICD-10-CM

## 2021-11-24 MED ORDER — FUROSEMIDE 20 MG PO TABS: ORAL_TABLET | ORAL | 0 refills | Status: DC

## 2021-11-24 NOTE — Progress Notes (Signed)
BP 122/85   Pulse 100   Temp (!) 97.1 F (36.2 C)   Ht _0  (1.88 m)   Wt 178 lb (80.7 kg)   SpO2 96%   BMI 22.85 kg/m    Subjective:   Patient ID: Danny [name]., male    DOB: 07-Oct-1931, 88 y.o.   MRN: [id number]  HPI: Danny [name]. is a 88 y.o. male presenting on 11/24/2021 for Wound Check (RLE- started 3-4 days ago, draining)   HPI Patient is coming today with lower extremity edema and swelling and a wound on his right leg that is weeping.  Per his wife is having a clear weeping drainage and has been going on for about 3 to 4 days.  She says that he did see cardiology just over a week ago and cardiology did give an increased dose of furosemide because they were concerned about the swelling but he was not having weeping at the time.  He denies any fevers or chills or redness or warmth.  He has had a little bit more congestion worried about fluid in the lungs especially with increased swelling.  He does not himself at home but here in the office his weights are increased.  Relevant past medical, surgical, family and social history reviewed and updated as indicated. Interim medical history since our last visit reviewed. Allergies and medications reviewed and updated.  Review of Systems  Constitutional:  Negative for chills and fever.  HENT:  Positive for congestion.   Eyes:  Negative for visual disturbance.  Respiratory:  Negative for cough, shortness of breath and wheezing.   Cardiovascular:  Positive for leg swelling. Negative for chest pain.  Musculoskeletal:  Negative for back pain and gait problem.  Skin:  Positive for wound. Negative for color change and rash.  All other systems reviewed and are negative.   Per HPI unless specifically indicated above   Allergies as of 11/24/2021       Reactions   Doxycycline Diarrhea   Contrast Media [iodinated Contrast Media]    Syncope   Lunesta [eszopiclone]    "felt WILD"   Morphine And Related Nausea And Vomiting    Trazodone And Nefazodone Other (See Comments)   Extremely drowsy the next day after taking.        Medication List        Accurate as of November 24, 2021 10:07 AM. If you have any questions, ask your nurse or doctor.          acetaminophen 325 MG tablet Commonly known as: TYLENOL Take 650 mg by mouth every 6 (six) hours as needed.   aspirin 81 MG chewable tablet Chew 81 mg by mouth daily. atrial fibrillation   cholecalciferol 25 MCG (1000 UNIT) tablet Commonly known as: VITAMIN D3 Take 1,000 Units by mouth daily.   Ensure Take 1 Can by mouth as needed.   furosemide 20 MG tablet Commonly known as: LASIX TAKE 1 TABLET EVERY DAY What changed: Another medication with the same name was added. Make sure you understand how and when to take each. Changed by: Fransisca Kaufmann Angelli Baruch, MD   furosemide 20 MG tablet Commonly known as: LASIX Take an extra 20 mg tablet every day for the next 5 days What changed: You were already taking a medication with the same name, and this prescription was added. Make sure you understand how and when to take each. Changed by: Worthy Rancher, MD   levocetirizine 5 MG tablet  Commonly known as: XYZAL TAKE 1 TABLET EVERY EVENING   metoprolol succinate 25 MG 24 hr tablet Commonly known as: TOPROL-XL Take 1 tablet (25 mg total) by mouth daily.   omeprazole 20 MG capsule Commonly known as: PRILOSEC Take 1 capsule (20 mg total) by mouth 2 (two) times daily before a meal.   potassium chloride SA 20 MEQ tablet Commonly known as: KLOR-CON M Take 2 tablets (40 mEq total) by mouth daily.   PRESERVISION AREDS 2 PO Take 1 tablet by mouth in the morning and at bedtime.   Risa-Bid Probiotic Tabs Take by mouth. 1 billion cell- 250 mg; oral Once A Day - PRN Special Instructions: abdominal discomfort         Objective:   BP 122/85   Pulse 100   Temp (!) 97.1 F (36.2 C)   Ht _0  (1.88 m)   Wt 178 lb (80.7 kg)   SpO2 96%   BMI 22.85  kg/m   Wt Readings from Last 3 Encounters:  11/24/21 178 lb (80.7 kg)  11/17/21 175 lb (79.4 kg)  10/27/21 162 lb (73.5 kg)    Physical Exam Vitals and nursing note reviewed.  Constitutional:      General: He is not in acute distress.    Appearance: He is well-developed. He is not diaphoretic.  Eyes:     General: No scleral icterus.    Conjunctiva/sclera: Conjunctivae normal.  Neck:     Thyroid: No thyromegaly.  Cardiovascular:     Rate and Rhythm: Normal rate and regular rhythm.     Heart sounds: Normal heart sounds. No murmur heard. Pulmonary:     Effort: Pulmonary effort is normal. No respiratory distress.     Breath sounds: Normal breath sounds. No wheezing, rhonchi or rales.  Musculoskeletal:        General: Swelling (2+ edema in bilateral lower extremities) present. Normal range of motion.     Cervical back: Neck supple.  Lymphadenopathy:     Cervical: No cervical adenopathy.  Skin:    General: Skin is warm and dry.     Findings: Wound present. No rash.       Neurological:     Mental Status: He is alert and oriented to person, place, and time.     Coordination: Coordination normal.  Psychiatric:        Behavior: Behavior normal.     Nurse to apply Unna boot  Assessment & Plan:   Problem List Items Addressed This Visit   None Visit Diagnoses     Acute on chronic systolic congestive heart failure (HCC)    -  Primary   Relevant Medications   furosemide (LASIX) 20 MG tablet   Other Relevant Orders   Apply unna boot   BMP8+EGFR   Peripheral edema       Relevant Orders   Apply unna boot   BMP8+EGFR   Venous insufficiency of both lower extremities       Relevant Medications   furosemide (LASIX) 20 MG tablet   Other Relevant Orders   Apply unna boot   BMP8+EGFR   Venous ulcer of right leg (HCC)       Relevant Orders   Apply unna boot   BMP8+EGFR       We will do Unna boots and have come back in just under a week.  Likely venous insufficiency ulcer  and weeping from that.  Concerned about congestive heart failure because ejection fraction has been lower and having  some congestion.  Will increase diuretic for the next 5 days and check a BMP.  Weight is increased as well. Follow up plan: Return in about 1 week (around 12/01/2021), or if symptoms worsen or fail to improve, for [employer] recheck.  Counseling provided for all of the vaccine components Orders Placed This Encounter  Procedures   BMP8+EGFR   Apply unna Marshall, MD [facility] 11/24/2021, 10:07 AM

## 2021-11-25 LAB — BMP8+EGFR
BUN/Creatinine Ratio: 20 (ref 10–24)
BUN: 19 mg/dL (ref 8–27)
CO2: 24 mmol/L (ref 20–29)
Calcium: 8.9 mg/dL (ref 8.6–10.2)
Chloride: 92 mmol/L — ABNORMAL LOW (ref 96–106)
Creatinine, Ser: 0.94 mg/dL (ref 0.76–1.27)
Glucose: 122 mg/dL — ABNORMAL HIGH (ref 70–99)
Potassium: 4.5 mmol/L (ref 3.5–5.2)
Sodium: 132 mmol/L — ABNORMAL LOW (ref 134–144)
eGFR: 78 mL/min/{1.73_m2} (ref >59)

## 2021-11-30 ENCOUNTER — Encounter: Payer: Self-pay | Admitting: Family Medicine

## 2021-11-30 ENCOUNTER — Ambulatory Visit (INDEPENDENT_AMBULATORY_CARE_PROVIDER_SITE_OTHER): Payer: Medicare HMO | Admitting: Family Medicine

## 2021-11-30 VITALS — BP 124/73 | HR 88 | Temp 97.8°F | Ht 74.0 in | Wt 181.6 lb

## 2021-11-30 DIAGNOSIS — R609 Edema, unspecified: Secondary | ICD-10-CM | POA: Diagnosis not present

## 2021-11-30 DIAGNOSIS — I5023 Acute on chronic systolic (congestive) heart failure: Secondary | ICD-10-CM

## 2021-11-30 DIAGNOSIS — I872 Venous insufficiency (chronic) (peripheral): Secondary | ICD-10-CM

## 2021-11-30 DIAGNOSIS — I83019 Varicose veins of right lower extremity with ulcer of unspecified site: Secondary | ICD-10-CM

## 2021-11-30 DIAGNOSIS — L03115 Cellulitis of right lower limb: Secondary | ICD-10-CM | POA: Diagnosis not present

## 2021-11-30 DIAGNOSIS — F0390 Unspecified dementia without behavioral disturbance: Secondary | ICD-10-CM | POA: Diagnosis not present

## 2021-11-30 DIAGNOSIS — L97919 Non-pressure chronic ulcer of unspecified part of right lower leg with unspecified severity: Secondary | ICD-10-CM

## 2021-11-30 DIAGNOSIS — S91102A Unspecified open wound of left great toe without damage to nail, initial encounter: Secondary | ICD-10-CM

## 2021-11-30 MED ORDER — CEPHALEXIN 500 MG PO CAPS: 500.0000 mg | ORAL_CAPSULE | Freq: Four times a day (QID) | ORAL | 0 refills | Status: AC

## 2021-11-30 MED ORDER — METOLAZONE 2.5 MG PO TABS: 2.5000 mg | ORAL_TABLET | Freq: Every day | ORAL | 0 refills | Status: DC

## 2021-11-30 NOTE — Progress Notes (Signed)
Subjective:  Patient ID: Danny Loron., male    DOB: Jun 10, 1931, 88 y.o.   MRN: [id number]  Patient Care Team: Baruch Gouty, FNP as PCP - General (Family Medicine) Minus Breeding, MD as PCP - Cardiology (Cardiology) [name], MD (Inactive) as Consulting Physician (Gastroenterology) Okey Regal, OD (Optometry) Rexene Alberts, MD (Inactive) as Consulting Physician (Cardiothoracic Surgery) Allyn Kenner, MD as Consulting Physician (Dermatology)   Chief Complaint:  Louretta Parma boots (Bilateral follow up)   HPI: Danny Court. is a 88 y.o. male presenting on 11/30/2021 for unna boots (Bilateral follow up)   Pt presents today for unna boot removal and reevaluation of bilateral lower extremity swelling and venous stasis ulcer to right anterior leg. He reports increased orthopnea over the last few days, has had to prop on two or more pillows. Frequent coughing. No fever, chills, weakness, or increased confusion. No shortness of breath during the day. Not very active on a daily basis. Wife states he is more dependent on her care and she is having a hard time with this as she is aging herself and unable to perform all of the tasks he needs done. He does have to have assistance with all ADLs.      Relevant past medical, surgical, family, and social history reviewed and updated as indicated.  Allergies and medications reviewed and updated. Data reviewed: Chart in Epic.   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 01/14/2014   Vitamin D deficiency     Past Surgical History:  Procedure Laterality Date    CARDIOVERSION N/A 05/05/2016   Procedure: CARDIOVERSION;  Surgeon: Josue Hector, MD;  Location: [facility];  Service: Cardiovascular;  Laterality: N/A;   CAROTID ENDARTERECTOMY Left 2003   CAROTID-SUBCLAVIAN BYPASS GRAFT Right    COLONOSCOPY  2005   hyperplastic polyp x 1 with no adenoma   COLONOSCOPY N/A 04/11/2014   EPP:IRJJ diverticulosis in the sigmoid colon/left colon is redundant   CORONARY ARTERY BYPASS GRAFT     ESOPHAGEAL DILATION N/A 04/11/2014   Procedure: ESOPHAGEAL DILATION;  Surgeon: [name], MD;  [facility];  Service: Endoscopy;  Laterality: N/A;   ESOPHAGOGASTRODUODENOSCOPY N/A 04/11/2014   OAC:[id number] at the gastro junction/moderate erosive/duodenal web   EYE SURGERY Bilateral    cataracts   RIGHT/LEFT HEART CATH AND CORONARY/GRAFT ANGIOGRAPHY N/A 02/11/2020   Procedure: RIGHT/LEFT HEART CATH AND CORONARY/GRAFT ANGIOGRAPHY;  Surgeon: Martinique, Peter M, MD;  Location: [facility];  Service: Cardiovascular;  Laterality: N/A;   SKIN CANCER EXCISION      Social History   Socioeconomic History   Marital status: Married    Spouse name: ruby   Number of children: 1   Years of education: Not on file   Highest education level: Not on file  Occupational History   Occupation: retired    Fish farm manager: [employer]  Tobacco Use   Smoking status: Former    Types: Cigarettes    Start date: 01/24/1950    Quit date: 01/24/1961    Years  since quitting: 60.8   Smokeless tobacco: Never  Vaping Use   Vaping Use: Never used  Substance and Sexual Activity   Alcohol use: Not Currently    Alcohol/week: 0.0 standard drinks of alcohol    Comment: not drank since 12/2009   Drug use: No   Sexual activity: Yes    Partners: Female  Other Topics Concern   Not on file  Social History Narrative   Not on file   Social Determinants of Health   Financial Resource Strain: Not on file  Food Insecurity: Not on file  Transportation Needs: Not on file  Physical Activity:  Not on file  Stress: Not on file  Social Connections: Not on file  Intimate Partner Violence: Not on file    Outpatient Encounter Medications as of 11/30/2021  Medication Sig   acetaminophen (TYLENOL) 325 MG tablet Take 650 mg by mouth every 6 (six) hours as needed.   aspirin 81 MG chewable tablet Chew 81 mg by mouth daily. atrial fibrillation   cephALEXin (KEFLEX) 500 MG capsule Take 1 capsule (500 mg total) by mouth 4 (four) times daily for 7 days.   cholecalciferol (VITAMIN D) 25 MCG (1000 UNIT) tablet Take 1,000 Units by mouth daily.   Ensure (ENSURE) Take 1 Can by mouth as needed.   furosemide (LASIX) 20 MG tablet TAKE 1 TABLET EVERY DAY   furosemide (LASIX) 20 MG tablet Take an extra 20 mg tablet every day for the next 5 days   levocetirizine (XYZAL) 5 MG tablet TAKE 1 TABLET EVERY EVENING   metolazone (ZAROXOLYN) 2.5 MG tablet Take 1 tablet (2.5 mg total) by mouth daily for 2 days. Take 30 minutes prior to lasix   metoprolol succinate (TOPROL-XL) 25 MG 24 hr tablet Take 1 tablet (25 mg total) by mouth daily.   Multiple Vitamins-Minerals (PRESERVISION AREDS 2 PO) Take 1 tablet by mouth in the morning and at bedtime.   omeprazole (PRILOSEC) 20 MG capsule Take 1 capsule (20 mg total) by mouth 2 (two) times daily before a meal.   potassium chloride SA (KLOR-CON M) 20 MEQ tablet Take 2 tablets (40 mEq total) by mouth daily.   Probiotic Product (RISA-BID PROBIOTIC) TABS Take by mouth. 1 billion cell- 250 mg; oral Once A Day - PRN Special Instructions: abdominal discomfort   No facility-administered encounter medications on file as of 11/30/2021.    Allergies  Allergen Reactions   Doxycycline Diarrhea   Contrast Media [Iodinated Contrast Media]     Syncope   Lunesta [Eszopiclone]     "felt WILD"   Morphine And Related Nausea And Vomiting   Trazodone And Nefazodone Other (See Comments)    Extremely drowsy the next day after taking.    Review of Systems      Objective:  BP  124/73   Pulse 88   Temp 97.8 F (36.6 C) (Temporal)   Ht _0  (1.88 m)   Wt 181 lb 9.6 oz (82.4 kg)   SpO2 97%   BMI 23.32 kg/m    Wt Readings from Last 3 Encounters:  11/30/21 181 lb 9.6 oz (82.4 kg)  11/24/21 178 lb (80.7 kg)  11/17/21 175 lb (79.4 kg)    Physical Exam Vitals and nursing note reviewed.  Constitutional:      General: He is not in acute distress.    Appearance: He is normal weight. He is ill-appearing (chronically). He is not toxic-appearing or diaphoretic.  HENT:     Head: Normocephalic and atraumatic.  Mouth/Throat:     Mouth: Mucous membranes are moist.  Eyes:     Pupils: Pupils are equal, round, and reactive to light.  Cardiovascular:     Rate and Rhythm: Normal rate.     Heart sounds: Murmur heard.     Systolic murmur is present with a grade of 3/6.  Pulmonary:     Effort: Pulmonary effort is normal.     Breath sounds: Normal breath sounds.  Musculoskeletal:     Right lower leg: 2+ Edema present.     Left lower leg: 2+ Edema present.  Skin:    General: Skin is warm.     Capillary Refill: Capillary refill takes less than 2 seconds.     Findings: Erythema and wound present.       Neurological:     Mental Status: He is alert.     Gait: Gait abnormal (in wheelchair).  Psychiatric:        Cognition and Memory: Cognition is impaired. Memory is impaired.     Results for orders placed or performed in visit on 11/24/21  BMP8+EGFR  Result Value Ref Range   Glucose 122 (H) 70 - 99 mg/dL   BUN 19 8 - 27 mg/dL   Creatinine, Ser 0.94 0.76 - 1.27 mg/dL   eGFR 78 >59 mL/min/1.73   BUN/Creatinine Ratio 20 10 - 24   Sodium 132 (L) 134 - 144 mmol/L   Potassium 4.5 3.5 - 5.2 mmol/L   Chloride 92 (L) 96 - 106 mmol/L   CO2 24 20 - 29 mmol/L   Calcium 8.9 8.6 - 10.2 mg/dL       Pertinent labs & imaging results that were available during my care of the patient were reviewed by me and considered in my medical decision making.  Assessment & Plan:   Junah was seen today for unna boots.  Diagnoses and all orders for this visit:  Venous insufficiency of both lower extremities Open wound of left great toe, initial encounter Venous ulcer of right leg (HCC) Cellulitis of right lower extremity HH referral for wound care. Keflex for cellulitis. If HH is not able to see pt this week, pt is to follow up in office.  -     Ambulatory referral to [facility] -     cephALEXin (KEFLEX) 500 MG capsule; Take 1 capsule (500 mg total) by mouth 4 (four) times daily for 7 days.  Acute on chronic systolic congestive heart failure (HCC) Peripheral edema Will add metolazone for the next two days to see if beneficial. Pt aware to make follow up with cardiology if symptoms persist or worsen. Aware of red flags which require emergent evaluation and treatment.  -     metolazone (ZAROXOLYN) 2.5 MG tablet; Take 1 tablet (2.5 mg total) by mouth daily for 2 days. Take 30 minutes prior to lasix -     Ambulatory referral to [place], old age [facility]) Worsening. Referral to [facility] placed.  -     Ambulatory referral to [facility] all other maintenance medications.  Follow up plan: Return in about 3 days (around 12/03/2021), or if symptoms worsen or fail to improve, for in [facility] unable to come to house.   Continue healthy lifestyle choices, including diet (rich in fruits, vegetables, and lean proteins, and low in salt and simple carbohydrates) and exercise (at least 30 minutes of moderate physical activity daily).  The above assessment and management plan was discussed with  the patient. The patient verbalized understanding of and has agreed to the management plan. Patient is aware to call the clinic if they develop any new symptoms or if symptoms persist or worsen. Patient is aware when to return to the clinic for a follow-up visit. Patient educated on when it is appropriate to go to the emergency department.   Monia Pouch, FNP-C [facility] [phone]

## 2021-11-30 NOTE — Patient Instructions (Addendum)
Take keflex four times daily for 7 days  Take metolazone 30 minutes prior to lasix for the next 2 days

## 2021-12-03 ENCOUNTER — Emergency Department (HOSPITAL_COMMUNITY)
Admission: EM | Admit: 2021-12-03 | Discharge: 2021-12-03 | Disposition: A | Discharge status: Home / Self Care | Payer: Medicare HMO | Attending: Emergency Medicine | Admitting: Emergency Medicine

## 2021-12-03 ENCOUNTER — Emergency Department (HOSPITAL_COMMUNITY): Payer: Medicare HMO

## 2021-12-03 ENCOUNTER — Encounter (HOSPITAL_COMMUNITY): Payer: Self-pay

## 2021-12-03 ENCOUNTER — Other Ambulatory Visit: Payer: Self-pay

## 2021-12-03 ENCOUNTER — Ambulatory Visit (INDEPENDENT_AMBULATORY_CARE_PROVIDER_SITE_OTHER): Payer: Medicare HMO | Admitting: Family Medicine

## 2021-12-03 ENCOUNTER — Encounter: Payer: Self-pay | Admitting: Family Medicine

## 2021-12-03 VITALS — BP 96/61 | HR 87 | Temp 97.6°F | Ht 74.0 in | Wt 179.2 lb

## 2021-12-03 DIAGNOSIS — I5023 Acute on chronic systolic (congestive) heart failure: Secondary | ICD-10-CM | POA: Diagnosis not present

## 2021-12-03 DIAGNOSIS — R531 Weakness: Secondary | ICD-10-CM | POA: Diagnosis not present

## 2021-12-03 DIAGNOSIS — Z79899 Other long term (current) drug therapy: Secondary | ICD-10-CM | POA: Insufficient documentation

## 2021-12-03 DIAGNOSIS — R5381 Other malaise: Secondary | ICD-10-CM | POA: Diagnosis not present

## 2021-12-03 DIAGNOSIS — L03116 Cellulitis of left lower limb: Secondary | ICD-10-CM | POA: Diagnosis not present

## 2021-12-03 DIAGNOSIS — R0602 Shortness of breath: Secondary | ICD-10-CM | POA: Diagnosis not present

## 2021-12-03 DIAGNOSIS — I2510 Atherosclerotic heart disease of native coronary artery without angina pectoris: Secondary | ICD-10-CM | POA: Insufficient documentation

## 2021-12-03 DIAGNOSIS — S61402A Unspecified open wound of left hand, initial encounter: Secondary | ICD-10-CM | POA: Diagnosis not present

## 2021-12-03 DIAGNOSIS — R410 Disorientation, unspecified: Secondary | ICD-10-CM | POA: Diagnosis not present

## 2021-12-03 DIAGNOSIS — R600 Localized edema: Secondary | ICD-10-CM | POA: Diagnosis not present

## 2021-12-03 DIAGNOSIS — M19042 Primary osteoarthritis, left hand: Secondary | ICD-10-CM | POA: Diagnosis not present

## 2021-12-03 DIAGNOSIS — Z7982 Long term (current) use of aspirin: Secondary | ICD-10-CM | POA: Insufficient documentation

## 2021-12-03 DIAGNOSIS — M7989 Other specified soft tissue disorders: Secondary | ICD-10-CM | POA: Diagnosis not present

## 2021-12-03 DIAGNOSIS — S81802A Unspecified open wound, left lower leg, initial encounter: Secondary | ICD-10-CM | POA: Diagnosis not present

## 2021-12-03 DIAGNOSIS — Z4800 Encounter for change or removal of nonsurgical wound dressing: Secondary | ICD-10-CM | POA: Insufficient documentation

## 2021-12-03 DIAGNOSIS — I10 Essential (primary) hypertension: Secondary | ICD-10-CM | POA: Diagnosis not present

## 2021-12-03 DIAGNOSIS — M79675 Pain in left toe(s): Secondary | ICD-10-CM | POA: Diagnosis present

## 2021-12-03 LAB — CBC WITH DIFFERENTIAL/PLATELET
Abs Immature Granulocytes: 0.02 10*3/uL (ref 0.00–0.07)
Basophils Absolute: 0 10*3/uL (ref 0.0–0.1)
Basophils Relative: 0 %
Eosinophils Absolute: 0 10*3/uL (ref 0.0–0.5)
Eosinophils Relative: 0 %
HCT: 39.7 % (ref 39.0–52.0)
Hemoglobin: 13.5 g/dL (ref 13.0–17.0)
Immature Granulocytes: 0 %
Lymphocytes Relative: 7 %
Lymphs Abs: 0.5 10*3/uL — ABNORMAL LOW (ref 0.7–4.0)
MCH: 32 pg (ref 26.0–34.0)
MCHC: 34 g/dL (ref 30.0–36.0)
MCV: 94.1 fL (ref 80.0–100.0)
Monocytes Absolute: 1 10*3/uL (ref 0.1–1.0)
Monocytes Relative: 15 %
Neutro Abs: 5.4 10*3/uL (ref 1.7–7.7)
Neutrophils Relative %: 78 %
Platelets: 191 10*3/uL (ref 150–400)
RBC: 4.22 MIL/uL (ref 4.22–5.81)
RDW: 16.6 % — ABNORMAL HIGH (ref 11.5–15.5)
WBC: 7 10*3/uL (ref 4.0–10.5)
nRBC: 0 % (ref 0.0–0.2)

## 2021-12-03 LAB — COMPREHENSIVE METABOLIC PANEL
ALT: 24 U/L (ref 0–44)
AST: 35 U/L (ref 15–41)
Albumin: 2.9 g/dL — ABNORMAL LOW (ref 3.5–5.0)
Alkaline Phosphatase: 153 U/L — ABNORMAL HIGH (ref 38–126)
Anion gap: 11 (ref 5–15)
BUN: 22 mg/dL (ref 8–23)
CO2: 26 mmol/L (ref 22–32)
Calcium: 8.2 mg/dL — ABNORMAL LOW (ref 8.9–10.3)
Chloride: 91 mmol/L — ABNORMAL LOW (ref 98–111)
Creatinine, Ser: 0.8 mg/dL (ref 0.61–1.24)
GFR, Estimated: >60 mL/min (ref >60)
Glucose, Bld: 116 mg/dL — ABNORMAL HIGH (ref 70–99)
Potassium: 3.4 mmol/L — ABNORMAL LOW (ref 3.5–5.1)
Sodium: 128 mmol/L — ABNORMAL LOW (ref 135–145)
Total Bilirubin: 2 mg/dL — ABNORMAL HIGH (ref 0.3–1.2)
Total Protein: 6.4 g/dL — ABNORMAL LOW (ref 6.5–8.1)

## 2021-12-03 MED ORDER — CEPHALEXIN 500 MG PO CAPS
500.0000 mg | ORAL_CAPSULE | Freq: Once | ORAL | Status: AC
  Administered 2021-12-03: 500 mg via ORAL
  Filled 2021-12-03: qty 1

## 2021-12-03 MED ORDER — CEPHALEXIN 500 MG PO CAPS: 500.0000 mg | ORAL_CAPSULE | Freq: Four times a day (QID) | ORAL | 0 refills | Status: DC

## 2021-12-03 NOTE — ED Triage Notes (Signed)
Niece reports pt has wound to L great toe.  Reports went to pcp and was sent here for xray and to r/o infection in the bone.  Also reports wound to top of left hand they want evaulated.

## 2021-12-03 NOTE — ED Notes (Signed)
Xray at [facility] 

## 2021-12-03 NOTE — ED Notes (Signed)
[place] finished at [facility]. Attempted to use urinal, unable.

## 2021-12-03 NOTE — ED Provider Notes (Signed)
[facility] EMERGENCY DEPARTMENT Provider Note   CSN: [id number] Arrival date & time: 12/03/21  1153     History  Chief Complaint  Patient presents with   Wound Check   HPI Danny Livecchi. is a 88 y.o. male atrial fibrillation, dementia, HTN , CAD, and wheelchair-bound presenting for wound check.  His niece states that he was evaluated by his PCP this morning for a wound on his left great toe.  PCP was concerned that he may have an infection that might be seeding in his bone.  Prompted evaluation here in the emergency department today.  Per patient, the toe is not painful, mildly swollen and red.  Also states that he sustained a superficial laceration to the dorsal aspect of his left hand after getting his hand "stuck in his wheelchair".   Wound Check       Home Medications Prior to Admission medications   Medication Sig Start Date End Date Taking? Authorizing Provider  acetaminophen (TYLENOL) 325 MG tablet Take 650 mg by mouth every 6 (six) hours as needed.   Yes [provider]  aspirin 81 MG chewable tablet Chew 81 mg by mouth daily. atrial fibrillation   Yes [provider]  cephALEXin (KEFLEX) 500 MG capsule Take 1 capsule (500 mg total) by mouth 4 (four) times daily for 7 days. 11/30/21 12/07/21 Yes Rakes, Connye Burkitt, FNP  cephALEXin (KEFLEX) 500 MG capsule Take 1 capsule (500 mg total) by mouth 4 (four) times daily. 12/03/21  Yes Harriet Pho, PA-C  cholecalciferol (VITAMIN D) 25 MCG (1000 UNIT) tablet Take 1,000 Units by mouth daily.   Yes [provider]  furosemide (LASIX) 20 MG tablet TAKE 1 TABLET EVERY DAY 10/18/21  Yes [name] F, FNP  levocetirizine (XYZAL) 5 MG tablet TAKE 1 TABLET EVERY EVENING 11/16/21  Yes Rakes, Connye Burkitt, FNP  metoprolol succinate (TOPROL-XL) 25 MG 24 hr tablet Take 1 tablet (25 mg total) by mouth daily. 10/27/21  Yes Rakes, Connye Burkitt, FNP  omeprazole (PRILOSEC) 20 MG capsule Take 1 capsule (20 mg total) by mouth 2  (two) times daily before a meal. 06/29/21  Yes [name] F, FNP  potassium chloride SA (KLOR-CON M) 20 MEQ tablet Take 2 tablets (40 mEq total) by mouth daily. 07/19/21  Yes [name] F, FNP  Probiotic Product (RISA-BID PROBIOTIC) TABS Take by mouth. 1 billion cell- 250 mg; oral Once A Day - PRN Special Instructions: abdominal discomfort   Yes [provider]  Ensure (ENSURE) Take 1 Can by mouth as needed.    [provider]  furosemide (LASIX) 20 MG tablet Take an extra 20 mg tablet every day for the next 5 days Patient not taking: Reported on 12/03/2021 11/24/21   Dettinger, Fransisca Kaufmann, MD  metolazone (ZAROXOLYN) 2.5 MG tablet Take 1 tablet (2.5 mg total) by mouth daily for 2 days. Take 30 minutes prior to lasix 11/30/21 12/02/21  Baruch Gouty, FNP      Allergies    Doxycycline, Contrast media [iodinated contrast media], Lunesta [eszopiclone], Morphine and related, and Trazodone and nefazodone    Review of Systems   See HPI for pertinent positives  Physical Exam Updated Vital Signs BP (!) 142/101 (BP Location: Right Arm)   Pulse 83   Temp 97.9 F (36.6 C) (Oral)   Resp 20   Ht '6\' 2"'$  (1.88 m)   Wt 81.6 kg   SpO2 95%   BMI 23.11 kg/m  Physical Exam Vitals  and nursing note reviewed.  HENT:     Head: Normocephalic and atraumatic.     Mouth/Throat:     Mouth: Mucous membranes are moist.  Eyes:     General:        Right eye: No discharge.        Left eye: No discharge.     Conjunctiva/sclera: Conjunctivae normal.  Cardiovascular:     Rate and Rhythm: Normal rate and regular rhythm.     Pulses:          Dorsalis pedis pulses are 2+ on the right side and 1+ on the left side.     Heart sounds: Normal heart sounds.  Pulmonary:     Effort: Pulmonary effort is normal.     Breath sounds: Normal breath sounds.  Abdominal:     General: Abdomen is flat.     Palpations: Abdomen is soft.  Musculoskeletal:     Comments: Left great toe: Superficial abrasion over  the medial aspect of the great toe.  Toe is erythematous, mildly swollen, sensation intact, with cap refill 2 - 3 seconds.  No oozing or bleeding.  Superficial abrasion about the dorsal aspect of the left hand.  It is well approximated, not oozing or bleeding.  Approximately 5 cm in length  There is noted on the left leg.  Left leg slightly pale.  Not mottled.  Range of motion normal.  Skin:    General: Skin is warm and dry.  Neurological:     General: No focal deficit present.  Psychiatric:        Mood and Affect: Mood normal.     ED Results / Procedures / Treatments   Labs (all labs ordered are listed, but only abnormal results are displayed) Labs Reviewed  COMPREHENSIVE METABOLIC PANEL - Abnormal; Notable for the following components:      Result Value   Sodium 128 (*)    Potassium 3.4 (*)    Chloride 91 (*)    Glucose, Bld 116 (*)    Calcium 8.2 (*)    Total Protein 6.4 (*)    Albumin 2.9 (*)    Alkaline Phosphatase 153 (*)    Total Bilirubin 2.0 (*)    All other components within normal limits  CBC WITH DIFFERENTIAL/PLATELET - Abnormal; Notable for the following components:   RDW 16.6 (*)    Lymphs Abs 0.5 (*)    All other components within normal limits    EKG None  Radiology [place] Lower Ext Art Left  Result Date: 12/03/2021 CLINICAL DATA:  Left lower extremity wounds EXAM: LEFT LOWER EXTREMITY ARTERIAL DUPLEX SCAN TECHNIQUE: Gray-scale sonography as well as color Doppler and duplex ultrasound was performed to evaluate the lower extremity arteries including the common, superficial and profunda femoral arteries, popliteal artery and calf arteries. COMPARISON:  None Available. FINDINGS: Left lower Extremity Inflow: Abnormal monophasic arterial waveforms. Heterogeneous atherosclerotic plaque. Outflow: Abnormal monophasic arterial waveforms throughout. Runoff: Abnormal monophasic arterial waveforms with diminished flow. IMPRESSION: Abnormal monophasic arterial waveforms  throughout the left lower extremity arterial tree. 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 Electronically Signed   By: Jacqulynn Cadet M.D.   On: 12/03/2021 16:23   DG Foot Complete Left  Result Date: 12/03/2021 CLINICAL DATA:  Left great toe wound EXAM: LEFT FOOT - COMPLETE 3+ VIEW COMPARISON:  Ankle radiographs 02/22/2018 FINDINGS: Bony demineralization. Extensive atherosclerotic vascular calcifications. No bony destructive findings characteristic of active osteomyelitis. No gas tracking in the soft tissues is identified. Dorsal  soft tissue swelling along the forefoot. Moderate central collapse of the calcaneus related to prior fracture. Cannot exclude pes planus. Plantar and Achilles calcaneal spurs. IMPRESSION: 1. No bony destructive findings characteristic of active osteomyelitis. 2. Dorsal soft tissue swelling along the forefoot. 3. Bony demineralization. 4. Plantar and Achilles calcaneal spurs. 5. Moderate central collapse of the calcaneus related to prior fracture. Electronically Signed   By: Van Clines M.D.   On: 12/03/2021 14:26   DG Hand Complete Left  Result Date: 12/03/2021 CLINICAL DATA:  Wound of the great toe and wound of the left hand EXAM: LEFT HAND - COMPLETE 3+ VIEW COMPARISON:  None Available. FINDINGS: Substantially abnormal distal phalanx middle finger with poor definition of normal cortical boundaries and extensive amorphous fragmentation. Cannot exclude underlying lucent expansile destructive lesion or osteomyelitis. Remote severe trauma or insult could possibly cause a similar appearance, correlate with patient history and symptoms involving the distal middle finger. Faint linear calcifications in the soft tissues of the ring and index fingers along the ulnar side of the middle phalanges. The fingers are mildly flexed during imaging, which affects diagnostic accuracy in assessing the phalanges. Substantial arterial atherosclerosis. Mild radial  sided spurring of the head of the index finger metacarpal. Mild degenerative findings at the thumb and index finger MCP joints. IMPRESSION: 1. Substantially abnormal appearance of the distal phalanx of the middle finger with extensive amorphous fragmentation and poor definition of the cortical boundaries. Cannot exclude underlying lucent expansile destructive lesion or osteomyelitis. Remote severe trauma or insult could possibly cause a similar appearance, correlate with patient history and symptoms. 2. Substantial arterial atherosclerosis. 3. Mild degenerative findings at the thumb and index finger MCP joints. Electronically Signed   By: Van Clines M.D.   On: 12/03/2021 14:23    Procedures Procedures    Medications Ordered in ED Medications  cephALEXin (KEFLEX) capsule 500 mg (500 mg Oral Given 12/03/21 1737)    ED Course/ Medical Decision Making/ A&P Clinical Course as of 12/03/21 1741  Fri Dec 03, 2021  1425 DG Foot Complete Left [JR]    Clinical Course User Index [JR] Harriet Pho, PA-C                           Medical Decision Making Amount and/or Complexity of Data Reviewed Labs: ordered. Radiology: ordered. Decision-making details documented in ED Course.   This patient presents to the ED for concern of wound check, this involves a number of treatment options, and is a complaint that carries with it a  high risk of complications and morbidity.  The differential diagnosis includes osteomyelitis, cellulitis, peripheral artery disease, and peripheral ischemia.   Co morbidities: Discussed in HPI   EMR reviewed including pt PMHx, past surgical history and past visits to ER.   See HPI for more details   Lab Tests:   I ordered and independently interpreted labs. Labs notable for hyponatremia, hyperglycemia   Imaging Studies:  Abnormal findings. I personally reviewed all imaging studies. Imaging notable for  Left leg arterial U/S Abnormal monophasic  arterial waveforms throughout the left lower  extremity arterial tree. Findings suggest proximal inflow  (aortoiliac) occlusive disease.      Cardiac Monitoring:  The patient was maintained on a cardiac monitor.  I personally viewed and interpreted the cardiac monitored which showed an underlying rhythm of: NSR NA   Medicines ordered:  I ordered medication including Keflex for cellulitis Reevaluation of the patient after  these medicines showed that the patient stayed the same I have reviewed the patients home medicines and have made adjustments as needed     Consults/Attending Physician   I requested consultation with Dr. Monica Martinez of vascular surgery,  and discussed lab and imaging findings as well as pertinent plan - they recommend: Outpatient follow-up in his office for arterial occlusive disease of the lower left extremity   Reevaluation:  After the interventions noted above I re-evaluated patient and found that they have :stayed the same    Problem List / ED Course: Patient presented for wound evaluation of his left great toe.  His PCP who evaluated him earlier today was concern for osteomyelitis.  Inspection of the left great toe revealed mild erythema, mild swelling, no pain, intact sensation and adequate cap refill.  No oozing or bleeding.  Clinically did not appear infectious.  X-ray of the left foot also reassuring that he does not have active osteomyelitis.  Inspection of the left leg was slightly more pale than the right, range of motion still intact.  Noted ulcers on his left leg.  Arterial ultrasound of the left leg revealed concerns for aortoiliac occlusive disease.  Consulted vascular surgery who advised to follow-up in his clinic in [place].  Discharge patient advised to continue taking his Keflex for cellulitis of the left leg.  Return precautions.   Dispostion:  After consideration of the diagnostic results and the patients response to treatment,  I feel that the patent would benefit from discharge, continue treatment for cellulitis of the left leg and follow-up with vascular surgery for arterial disease of the left leg.         Final Clinical Impression(s) / ED Diagnoses Final diagnoses:  Cellulitis of left lower extremity    Rx / DC Orders ED Discharge Orders          Ordered    cephALEXin (KEFLEX) 500 MG capsule  4 times daily        12/03/21 1732              Harriet Pho, PA-C 12/03/21 1744    Cristie Hem, MD 12/06/21 [phone]

## 2021-12-03 NOTE — Progress Notes (Signed)
Subjective:  Patient ID: Danny Loron., male    DOB: 06-24-1931, 88 y.o.   MRN: [id number]  Patient Care Team: Baruch Gouty, FNP as PCP - General (Family Medicine) Minus Breeding, MD as PCP - Cardiology (Cardiology) [name], MD (Inactive) as Consulting Physician (Gastroenterology) Okey Regal, OD (Optometry) Rexene Alberts, MD (Inactive) as Consulting Physician (Cardiothoracic Surgery) Allyn Kenner, MD (Inactive) as Consulting Physician (Dermatology)   Chief Complaint:  venous insufficiency of both lower extremities  (3 day re check/) and Fall (A few nights ago and has abrasion on left hand )   HPI: Danny Coba. is a 88 y.o. male presenting on 12/03/2021 for venous insufficiency of both lower extremities  (3 day re check/) and Fall (A few nights ago and has abrasion on left hand )   Pt presents today for wound recheck and bilateral lower extremity swelling recheck. Home health was requested and has not been to see pt. Pt has a medical history of acute on chronic heart failure, dementia, CAD s/p CAGB, PAF, HTN, hyperlipidemia, aortic atherosclerosis, carotid stenosis with subclavian steal syndrome, Vit D deficiency, and pulmonary hypertension. He was seen in office 3 days ago for wound recheck at which time he was noted to have new wounds to left foot and continued wounds to right shin and ankle. He was placed on keflex for cellulitis and scheduled for reevaluation of wounds today. He was also noted to have significant lower extremity swelling at this visit and wife reported lasix was not working to pull the fluid off so he was to take metolazone for two doses prior to lasix. Wife reports he took this but the swelling did not improve and he did not put out any additional urine. He had a fall at home yesterday causing a skin tear to his left hand.  He has become increasingly weaker, more confused, fatigued, and more short of breath. Wife is unable to care for him as he  has become total care.  His right leg has improved but left foot has worsened.      Relevant past medical, surgical, family, and social history reviewed and updated as indicated.  Allergies and medications reviewed and updated. Data reviewed: Chart in Epic.   Past Medical History:  Diagnosis Date   Anxiety    Atrial fibrillation ([place])    CAD, ARTERY BYPASS GRAFT 05/19/2008   Qualifier: Diagnosis of  By: Mare Ferrari, RMA, Sherri     Enteritis due to Clostridium difficile, history of 11/21/2012   GERD (gastroesophageal reflux disease)    Hyperlipidemia    Hypertension    Internal carotid artery stenosis    Shingles 06/2020   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 01/14/2014   Vitamin D deficiency     Past Surgical History:  Procedure Laterality Date   CARDIOVERSION N/A 05/05/2016   Procedure: CARDIOVERSION;  Surgeon: Josue Hector, MD;  Location: [facility];  Service: Cardiovascular;  Laterality: N/A;   CAROTID ENDARTERECTOMY Left 2003   CAROTID-SUBCLAVIAN BYPASS GRAFT Right    COLONOSCOPY  2005   hyperplastic polyp x 1 with no adenoma   COLONOSCOPY N/A 04/11/2014   PXT:GGYI diverticulosis in the sigmoid colon/left colon is redundant   CORONARY ARTERY BYPASS GRAFT     ESOPHAGEAL DILATION N/A 04/11/2014   Procedure: ESOPHAGEAL DILATION;  Surgeon: [name], MD;  [facility];  Service: Endoscopy;  Laterality: N/A;   ESOPHAGOGASTRODUODENOSCOPY N/A 04/11/2014  TYO:[id number] at the gastro junction/moderate erosive/duodenal web   EYE SURGERY Bilateral    cataracts   RIGHT/LEFT HEART CATH AND CORONARY/GRAFT ANGIOGRAPHY N/A 02/11/2020   Procedure: RIGHT/LEFT HEART CATH AND CORONARY/GRAFT ANGIOGRAPHY;  Surgeon: Martinique, Peter M, MD;  Location: [facility];  Service: Cardiovascular;  Laterality: N/A;   SKIN CANCER EXCISION      Social History   Socioeconomic History   Marital status: Married    Spouse name: ruby   Number of children: 1   Years of  education: Not on file   Highest education level: Not on file  Occupational History   Occupation: retired    Fish farm manager: [employer]  Tobacco Use   Smoking status: Former    Types: Cigarettes    Start date: 01/24/1950    Quit date: 01/24/1961    Years since quitting: 60.8   Smokeless tobacco: Never  Vaping Use   Vaping Use: Never used  Substance and Sexual Activity   Alcohol use: Not Currently    Alcohol/week: 0.0 standard drinks of alcohol    Comment: not drank since 12/2009   Drug use: No   Sexual activity: Yes    Partners: Female  Other Topics Concern   Not on file  Social History Narrative   Not on file   Social Determinants of Health   Financial Resource Strain: Not on file  Food Insecurity: Not on file  Transportation Needs: Not on file  Physical Activity: Not on file  Stress: Not on file  Social Connections: Not on file  Intimate Partner Violence: Not on file    Outpatient Encounter Medications as of 12/03/2021  Medication Sig   acetaminophen (TYLENOL) 325 MG tablet Take 650 mg by mouth every 6 (six) hours as needed.   aspirin 81 MG chewable tablet Chew 81 mg by mouth daily. atrial fibrillation   cephALEXin (KEFLEX) 500 MG capsule Take 1 capsule (500 mg total) by mouth 4 (four) times daily for 7 days.   cholecalciferol (VITAMIN D) 25 MCG (1000 UNIT) tablet Take 1,000 Units by mouth daily.   Ensure (ENSURE) Take 1 Can by mouth as needed.   furosemide (LASIX) 20 MG tablet TAKE 1 TABLET EVERY DAY   furosemide (LASIX) 20 MG tablet Take an extra 20 mg tablet every day for the next 5 days   levocetirizine (XYZAL) 5 MG tablet TAKE 1 TABLET EVERY EVENING   metoprolol succinate (TOPROL-XL) 25 MG 24 hr tablet Take 1 tablet (25 mg total) by mouth daily.   Multiple Vitamins-Minerals (PRESERVISION AREDS 2 PO) Take 1 tablet by mouth in the morning and at bedtime.   omeprazole (PRILOSEC) 20 MG capsule Take 1 capsule (20 mg total) by mouth 2 (two) times daily before a meal.   potassium  chloride SA (KLOR-CON M) 20 MEQ tablet Take 2 tablets (40 mEq total) by mouth daily.   Probiotic Product (RISA-BID PROBIOTIC) TABS Take by mouth. 1 billion cell- 250 mg; oral Once A Day - PRN Special Instructions: abdominal discomfort   metolazone (ZAROXOLYN) 2.5 MG tablet Take 1 tablet (2.5 mg total) by mouth daily for 2 days. Take 30 minutes prior to lasix   No facility-administered encounter medications on file as of 12/03/2021.    Allergies  Allergen Reactions   Doxycycline Diarrhea   Contrast Media [Iodinated Contrast Media]     Syncope   Lunesta [Eszopiclone]     "felt WILD"   Morphine And Related Nausea And Vomiting   Trazodone And Nefazodone Other (See Comments)  Extremely drowsy the next day after taking.    Review of Systems  Unable to perform ROS: Dementia        Objective:  BP 96/61   Pulse 87   Temp 97.6 F (36.4 C) (Temporal)   Ht _0  (1.88 m)   Wt 179 lb 3.2 oz (81.3 kg)   SpO2 95%   BMI 23.01 kg/m    Wt Readings from Last 3 Encounters:  12/03/21 179 lb 3.2 oz (81.3 kg)  11/30/21 181 lb 9.6 oz (82.4 kg)  11/24/21 178 lb (80.7 kg)    Physical Exam Vitals and nursing note reviewed.  Constitutional:      General: He is not in acute distress.    Appearance: He is ill-appearing. He is not toxic-appearing or diaphoretic.  HENT:     Head: Normocephalic and atraumatic.     Mouth/Throat:     Mouth: Mucous membranes are dry.  Eyes:     Pupils: Pupils are equal, round, and reactive to light.  Cardiovascular:     Rate and Rhythm: Normal rate. Rhythm irregular.     Heart sounds: Murmur heard.     Systolic murmur is present with a grade of 3/6.  Pulmonary:     Effort: Pulmonary effort is normal.     Breath sounds: Rhonchi and rales present.  Musculoskeletal:     Right lower leg: 3+ Pitting Edema present.     Left lower leg: 3+ Pitting Edema present.  Skin:    General: Skin is warm and dry.     Capillary Refill: Capillary refill takes less than 2  seconds.     Findings: Erythema and wound present.       Neurological:     Mental Status: He is confused.     Motor: Weakness present.     Gait: Gait abnormal (wheelchair, unable to ambulate).  Psychiatric:        Cognition and Memory: Cognition is impaired. Memory is impaired.     Results for orders placed or performed in visit on 11/24/21  BMP8+EGFR  Result Value Ref Range   Glucose 122 (H) 70 - 99 mg/dL   BUN 19 8 - 27 mg/dL   Creatinine, Ser 0.94 0.76 - 1.27 mg/dL   eGFR 78 >59 mL/min/1.73   BUN/Creatinine Ratio 20 10 - 24   Sodium 132 (L) 134 - 144 mmol/L   Potassium 4.5 3.5 - 5.2 mmol/L   Chloride 92 (L) 96 - 106 mmol/L   CO2 24 20 - 29 mmol/L   Calcium 8.9 8.6 - 10.2 mg/dL       Pertinent labs & imaging results that were available during my care of the patient were reviewed by me and considered in my medical decision making.  Assessment & Plan:  Kire was seen today for venous insufficiency of both lower extremities  and fall.  Diagnoses and all orders for this visit:  Acute on chronic systolic congestive heart failure (HCC) Weakness Shortness of breath Bilateral lower extremity edema Confusion Cellulitis of left lower extremity Physical deconditioning  Pt is significantly worse since previous visit. Now with cellulitis to left great toe / foot. No imaging in office, concerns for osteomyelitis. Shortness of breath, leg edema, weakness, and fatigue worsening, likely worsening heart failure. Pt sent to ED for evaluation and probable admission.   Follow up plan: ED for evaluation .    The above assessment and management plan was discussed with the patient. The patient verbalized understanding of and  has agreed to the management plan. Patient is aware to call the clinic if they develop any new symptoms or if symptoms persist or worsen. Patient is aware when to return to the clinic for a follow-up visit. Patient educated on when it is appropriate to go to the  emergency department.   Monia Pouch, FNP-C [place] [phone] '

## 2021-12-03 NOTE — Discharge Instructions (Addendum)
Evaluation for your wound of your great toe on the left foot was overall reassuring.  X-ray did not reveal concern for active infection of the bone of the great toe.  Overall expection of the left leg is concerning for arterial blood flow.  Reassuring however that you have good sensation of the distal left foot and adequate pulses.  Ultrasound did reveal concern for arterial occlusive disease of your left leg.  Recommend that you follow-up with Dr. Monica Martinez who is a vascular surgeon in [place].  For further evaluation.  If your left leg becomes numb, white or dark purple, if you lose sensation in your left leg to the emergency department for further evaluation.

## 2021-12-06 ENCOUNTER — Telehealth: Payer: Self-pay | Admitting: *Deleted

## 2021-12-06 NOTE — Patient Outreach (Signed)
  Care Coordination [facility] Note Transition Care Management Unsuccessful Follow-up Telephone Call  Date of discharge and from where:  12/03/21 from [facility] ED  Attempts:  1st Attempt for EMMI Red Flag for no discharge instructions  Reason for unsuccessful TCM follow-up call:  No answer/busy  Chong Sicilian, BSN, RN-BC RN Care Coordinator [place]: [phone] Main #: [phone]

## 2021-12-07 ENCOUNTER — Telehealth: Payer: Self-pay

## 2021-12-07 ENCOUNTER — Other Ambulatory Visit: Payer: Self-pay | Admitting: *Deleted

## 2021-12-07 DIAGNOSIS — R600 Localized edema: Secondary | ICD-10-CM

## 2021-12-07 NOTE — Patient Outreach (Signed)
  Care Coordination TOC Note Transition Care Management Follow-up Telephone Call Date of discharge and from where: 12/03/21-[name]  Dx: cellulitis Red on EMMI-[name] Discharge Alert: Have discharge instructions How have you been since you were released from the hospital? Call completed with caregiver/niece-Danny [name](DPR on file). She voices that patient is doing fairly well. He remains very weak and deconditioned. He is taking abx therapy to help with cellulitis Any questions or concerns? No  Items Reviewed: Did the pt receive and understand the discharge instructions provided? Yes -caregiver not with patient at present but voices they have paperwork somewhere in the home. Medications obtained and verified? Yes  Other? Yes  Any new allergies since your discharge? No  Dietary orders reviewed? Yes Do you have support at home? Yes -limited-spouse  Home Care and Equipment/Supplies: Were home health services ordered? not applicable-niece voices that during last PCP visit [facility] was discussed. Se will follow up with office as she feels like patient could benefit from services now.  If so, what is the name of the agency? N/A  Has the agency set up a time to come to the patient's home? not applicable Were any new equipment or medical supplies ordered?  No What is the name of the medical supply agency? N/A Were you able to get the supplies/equipment? not applicable Do you have any questions related to the use of the equipment or supplies? No  Functional Questionnaire: (I = Independent and D = Dependent) ADLs: A  Bathing/Dressing- A  Meal Prep- A  Eating- I  Maintaining continence- A  Transferring/Ambulation- A  Managing Meds- A  Follow up appointments reviewed:  PCP Hospital f/u appt confirmed? No . Patient was just seen the same day he went to [name] by PCP. [facility] f/u appt confirmed? Yes  Scheduled to see Dr. Scot Dock on 12/09/21 @ 1:20pm. Are transportation  arrangements needed? No  If their condition worsens, is the pt aware to call PCP or go to the Emergency Dept.? Yes Was the patient provided with contact information for the PCP's office or [name]? Yes Was to pt encouraged to call back with questions or concerns? Yes  SDOH assessments and interventions completed:   Yes  Care Coordination Interventions Activated:  Yes   Care Coordination Interventions:  Education provided    Encounter Outcome:  Pt. Visit Completed    Danny Montgomery, RN,BSN,CCM [facility] Telephonic Care Management Coordinator Direct Phone: [phone] Toll Free: [phone] Fax: [phone]

## 2021-12-08 ENCOUNTER — Ambulatory Visit (HOSPITAL_COMMUNITY)
Admission: RE | Admit: 2021-12-08 | Discharge: 2021-12-08 | Disposition: A | Discharge status: Home / Self Care | Payer: Medicare HMO | Source: Ambulatory Visit | Attending: Vascular Surgery | Admitting: Vascular Surgery

## 2021-12-08 DIAGNOSIS — R600 Localized edema: Secondary | ICD-10-CM | POA: Diagnosis not present

## 2021-12-09 ENCOUNTER — Ambulatory Visit: Payer: Medicare HMO | Admitting: Vascular Surgery

## 2021-12-09 ENCOUNTER — Other Ambulatory Visit: Payer: Self-pay

## 2021-12-09 ENCOUNTER — Encounter: Payer: Self-pay | Admitting: Vascular Surgery

## 2021-12-09 VITALS — BP 135/85 | HR 82 | Temp 97.9°F | Resp 20 | Ht 74.0 in | Wt 180.0 lb

## 2021-12-09 DIAGNOSIS — I70299 Other atherosclerosis of native arteries of extremities, unspecified extremity: Secondary | ICD-10-CM

## 2021-12-09 DIAGNOSIS — L97909 Non-pressure chronic ulcer of unspecified part of unspecified lower leg with unspecified severity: Secondary | ICD-10-CM

## 2021-12-09 NOTE — H&P (View-Only) (Signed)
ASSESSMENT & PLAN   PERIPHERAL ARTERIAL DISEASE WITH LEG ULCERS: This patient has critical limb ischemia.  He has evidence of multilevel arterial occlusive disease with nonhealing wounds of both legs.  I think without revascularization he is at risk for bilateral AKA's.  He is nonambulatory and does have some underlying dementia.  He is present with his family today who feels strongly about trying to save the legs if at all possible.  I have recommended we proceed with arteriography to see if there are any options for revascularization.  I think the left leg is the more concerning leg.  He would not be a good candidate for surgical intervention.  He is 88 years old with multiple medical comorbidities.  He is scheduled for arteriography and possible intervention on 12/13/2021 with Dr. Donzetta Matters.  I have discussed the indications for the procedure and the potential complications with the patient and his family today.  All of their questions were answered he is agreeable to proceed.  I will make further recommendations pending these results.  He is on aspirin.  His statin was recently discontinued by Dr. Percival Spanish.   REASON FOR CONSULT:    Nonhealing wounds with peripheral arterial disease.  The consult is requested by the [facility] emergency department.  HPI:   Shawntez Dickison. is a 88 y.o. male who was seen in the [facility] emergency department on 12/03/2021.  The patient has a history of atrial fibrillation, dementia, hypertension, coronary artery disease, and peripheral arterial disease.  He is wheelchair-bound.  He was sent to the ER to have a wound on his left great toe checked.  He had monophasic Doppler signals on exam.  The case was discussed with the vascular surgeon on-call who recommended an outpatient visit.  On my history, the patient developed a wound on his left great toe about 1 month ago.  He does not remember any specific injury.  At the same time he developed a wound on the anterior  aspect of his right leg.  He spends most of this time sitting with his legs dependent.  He developed cellulitis was seen in the emergency department.  He is on po antibiotics.  The wound has not improved.  He is nonambulatory.  He denies any history of rest pain.  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/14/2014   Vitamin D deficiency     Family History  Problem Relation Age of Onset   Heart disease Mother    Stroke Mother    Stroke Father    Diabetes Son    Stroke Son    Other Sister        ruptured appendix    Lung cancer Brother        asbestos   Mental illness Sister    Kidney disease Sister    Colon cancer Neg Hx        "not that I know of."    SOCIAL HISTORY: Social History   Tobacco Use   Smoking status: Former    Types: Cigarettes  Start date: 01/24/1950    Quit date: 01/24/1961    Years since quitting: 60.9   Smokeless tobacco: Never  Substance Use Topics   Alcohol use: Not Currently    Alcohol/week: 0.0 standard drinks of alcohol    Comment: not drank since 12/2009    Allergies  Allergen Reactions   Doxycycline Diarrhea   Contrast Media [Iodinated Contrast Media]     Syncope   Lunesta [Eszopiclone]     "felt WILD"   Morphine And Related Nausea And Vomiting   Trazodone And Nefazodone Other (See Comments)    Extremely drowsy the next day after taking.    Current Outpatient Medications  Medication Sig Dispense Refill   acetaminophen (TYLENOL) 325 MG tablet Take 650 mg by mouth every 6 (six) hours as needed.     aspirin 81 MG chewable tablet Chew 81 mg by mouth daily. atrial  fibrillation     cholecalciferol (VITAMIN D) 25 MCG (1000 UNIT) tablet Take 1,000 Units by mouth daily.     Ensure (ENSURE) Take 1 Can by mouth as needed.     furosemide (LASIX) 20 MG tablet TAKE 1 TABLET EVERY DAY 90 tablet 1   levocetirizine (XYZAL) 5 MG tablet TAKE 1 TABLET EVERY EVENING 90 tablet 1   metoprolol succinate (TOPROL-XL) 25 MG 24 hr tablet Take 1 tablet (25 mg total) by mouth daily. 90 tablet 2   omeprazole (PRILOSEC) 20 MG capsule Take 1 capsule (20 mg total) by mouth 2 (two) times daily before a meal. 180 capsule 3   potassium chloride SA (KLOR-CON M) 20 MEQ tablet Take 2 tablets (40 mEq total) by mouth daily. 180 tablet 1   Probiotic Product (RISA-BID PROBIOTIC) TABS Take by mouth. 1 billion cell- 250 mg; oral Once A Day - PRN Special Instructions: abdominal discomfort     metolazone (ZAROXOLYN) 2.5 MG tablet Take 1 tablet (2.5 mg total) by mouth daily for 2 days. Take 30 minutes prior to lasix 2 tablet 0   No current facility-administered medications for this visit.    REVIEW OF SYSTEMS:  '[X]'$  denotes positive finding, '[ ]'$  denotes negative finding Cardiac  Comments:  Chest pain or chest pressure:    Shortness of breath upon exertion: x   Short of breath when lying flat:    Irregular heart rhythm:        Vascular    Pain in calf, thigh, or hip brought on by ambulation:    Pain in feet at night that wakes you up from your sleep:     Blood clot in your veins:    Leg swelling:  x       Pulmonary    Oxygen at home:    Productive cough:     Wheezing:         Neurologic    Sudden weakness in arms or legs:     Sudden numbness in arms or legs:     Sudden onset of difficulty speaking or slurred speech:    Temporary loss of vision in one eye:     Problems with dizziness:         Gastrointestinal    Blood in stool:     Vomited blood:         Genitourinary    Burning when urinating:     Blood in urine:        Psychiatric    Major depression:          Hematologic    Bleeding  problems:    Problems with blood clotting too easily:        Skin    Rashes or ulcers: x       Constitutional    Fever or chills:    -  PHYSICAL EXAM:   Vitals:   12/09/21 1317  BP: 135/85  Pulse: 82  Resp: 20  Temp: 97.9 F (36.6 C)  SpO2: 96%  Weight: 180 lb (81.6 kg)  Height: '6\' 2"'$  (1.88 m)   Body mass index is 23.11 kg/m. GENERAL: The patient is a well-nourished male, in no acute distress. The vital signs are documented above. CARDIAC: He has a systolic ejection murmur. VASCULAR: I do not detect carotid bruits. He was very difficult to examine as he was in a wheelchair and is unable to stand so we could not get him up on the table.  He has moderate obesity and femoral pulses were difficult to assess in the wheelchair.  I thought I could palpate pulses in both groins.  I did confirm with the Doppler he does appear to have decent Doppler signals in both femoral arteries. I cannot palpate pedal pulses. He has a wound on the right leg and a wound on the left great toe with cellulitis bilaterally.     PULMONARY: There is good air exchange bilaterally without wheezing or rales. ABDOMEN: Soft and non-tender with normal pitched bowel sounds.  MUSCULOSKELETAL: There are no major deformities. NEUROLOGIC: No focal weakness or paresthesias are detected. SKIN: There are no ulcers or rashes noted. PSYCHIATRIC: The patient has a normal affect.  DATA:    ARTERIAL DOPPLER STUDY: I have reviewed the arterial Doppler study that was done yesterday.   On the right side there is a monophasic posterior tibial signal.  There is no dorsalis pedis signal.  ABI could not be obtained as the artery is not compressible.  Toe pressures 0.  On the left side there is a monophasic posterior tibial signal.  There is a dampened monophasic dorsalis pedis signal.  The arteries are noncompressible and ABI cannot be obtained.  LABS: I reviewed the labs from 12/03/2021.  GFR  was greater than 60.  Creatinine 0.8.  White blood cell count 7.0.  Hemoglobin 13.5.  Platelets 1 91,000  X-RAY LEFT FOOT: This shows no evidence of osteomyelitis.  There is some dorsal soft tissue swelling along the forefoot.   Deitra Mayo [facility]

## 2021-12-09 NOTE — Progress Notes (Signed)
ASSESSMENT & PLAN   PERIPHERAL ARTERIAL DISEASE WITH LEG ULCERS: This patient has critical limb ischemia.  He has evidence of multilevel arterial occlusive disease with nonhealing wounds of both legs.  I think without revascularization he is at risk for bilateral AKA's.  He is nonambulatory and does have some underlying dementia.  He is present with his family today who feels strongly about trying to save the legs if at all possible.  I have recommended we proceed with arteriography to see if there are any options for revascularization.  I think the left leg is the more concerning leg.  He would not be a good candidate for surgical intervention.  He is 88 years old with multiple medical comorbidities.  He is scheduled for arteriography and possible intervention on 12/13/2021 with Dr. Donzetta Matters.  I have discussed the indications for the procedure and the potential complications with the patient and his family today.  All of their questions were answered he is agreeable to proceed.  I will make further recommendations pending these results.  He is on aspirin.  His statin was recently discontinued by Dr. Percival Spanish.   REASON FOR CONSULT:    Nonhealing wounds with peripheral arterial disease.  The consult is requested by the [facility] emergency department.  HPI:   [name]. is a 88 y.o. male who was seen in the [facility] emergency department on 12/03/2021.  The patient has a history of atrial fibrillation, dementia, hypertension, coronary artery disease, and peripheral arterial disease.  He is wheelchair-bound.  He was sent to the ER to have a wound on his left great toe checked.  He had monophasic Doppler signals on exam.  The case was discussed with the vascular surgeon on-call who recommended an outpatient visit.  On my history, the patient developed a wound on his left great toe about 1 month ago.  He does not remember any specific injury.  At the same time he developed a wound on the anterior  aspect of his right leg.  He spends most of this time sitting with his legs dependent.  He developed cellulitis was seen in the emergency department.  He is on po antibiotics.  The wound has not improved.  He is nonambulatory.  He denies any history of rest pain.  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 01/14/2014   Vitamin D deficiency     Family History  Problem Relation Age of Onset   Heart disease Mother    Stroke Mother    Stroke Father    Diabetes Son    Stroke Son    Other Sister        ruptured appendix    Lung cancer Brother        asbestos   Mental illness Sister    Kidney disease Sister    Colon cancer Neg Hx        "not that I know of."    SOCIAL HISTORY: Social History   Tobacco Use   Smoking status: Former    Types: Cigarettes  Start date: 01/24/1950    Quit date: 01/24/1961    Years since quitting: 60.9   Smokeless tobacco: Never  Substance Use Topics   Alcohol use: Not Currently    Alcohol/week: 0.0 standard drinks of alcohol    Comment: not drank since 12/2009    Allergies  Allergen Reactions   Doxycycline Diarrhea   Contrast Media [Iodinated Contrast Media]     Syncope   Lunesta [Eszopiclone]     "felt WILD"   Morphine And Related Nausea And Vomiting   Trazodone And Nefazodone Other (See Comments)    Extremely drowsy the next day after taking.    Current Outpatient Medications  Medication Sig Dispense Refill   acetaminophen (TYLENOL) 325 MG tablet Take 650 mg by mouth every 6 (six) hours as needed.     aspirin 81 MG chewable tablet Chew 81 mg by mouth daily. atrial  fibrillation     cholecalciferol (VITAMIN D) 25 MCG (1000 UNIT) tablet Take 1,000 Units by mouth daily.     Ensure (ENSURE) Take 1 Can by mouth as needed.     furosemide (LASIX) 20 MG tablet TAKE 1 TABLET EVERY DAY 90 tablet 1   levocetirizine (XYZAL) 5 MG tablet TAKE 1 TABLET EVERY EVENING 90 tablet 1   metoprolol succinate (TOPROL-XL) 25 MG 24 hr tablet Take 1 tablet (25 mg total) by mouth daily. 90 tablet 2   omeprazole (PRILOSEC) 20 MG capsule Take 1 capsule (20 mg total) by mouth 2 (two) times daily before a meal. 180 capsule 3   potassium chloride SA (KLOR-CON M) 20 MEQ tablet Take 2 tablets (40 mEq total) by mouth daily. 180 tablet 1   Probiotic Product (RISA-BID PROBIOTIC) TABS Take by mouth. 1 billion cell- 250 mg; oral Once A Day - PRN Special Instructions: abdominal discomfort     metolazone (ZAROXOLYN) 2.5 MG tablet Take 1 tablet (2.5 mg total) by mouth daily for 2 days. Take 30 minutes prior to lasix 2 tablet 0   No current facility-administered medications for this visit.    REVIEW OF SYSTEMS:  '[X]'$  denotes positive finding, '[ ]'$  denotes negative finding Cardiac  Comments:  Chest pain or chest pressure:    Shortness of breath upon exertion: x   Short of breath when lying flat:    Irregular heart rhythm:        Vascular    Pain in calf, thigh, or hip brought on by ambulation:    Pain in feet at night that wakes you up from your sleep:     Blood clot in your veins:    Leg swelling:  x       Pulmonary    Oxygen at home:    Productive cough:     Wheezing:         Neurologic    Sudden weakness in arms or legs:     Sudden numbness in arms or legs:     Sudden onset of difficulty speaking or slurred speech:    Temporary loss of vision in one eye:     Problems with dizziness:         Gastrointestinal    Blood in stool:     Vomited blood:         Genitourinary    Burning when urinating:     Blood in urine:        Psychiatric    Major depression:          Hematologic    Bleeding  problems:    Problems with blood clotting too easily:        Skin    Rashes or ulcers: x       Constitutional    Fever or chills:    -  PHYSICAL EXAM:   Vitals:   12/09/21 1317  BP: 135/85  Pulse: 82  Resp: 20  Temp: 97.9 F (36.6 C)  SpO2: 96%  Weight: 180 lb (81.6 kg)  Height: '6\' 2"'$  (1.88 m)   Body mass index is 23.11 kg/m. GENERAL: The patient is a well-nourished male, in no acute distress. The vital signs are documented above. CARDIAC: He has a systolic ejection murmur. VASCULAR: I do not detect carotid bruits. He was very difficult to examine as he was in a wheelchair and is unable to stand so we could not get him up on the table.  He has moderate obesity and femoral pulses were difficult to assess in the wheelchair.  I thought I could palpate pulses in both groins.  I did confirm with the Doppler he does appear to have decent Doppler signals in both femoral arteries. I cannot palpate pedal pulses. He has a wound on the right leg and a wound on the left great toe with cellulitis bilaterally.     PULMONARY: There is good air exchange bilaterally without wheezing or rales. ABDOMEN: Soft and non-tender with normal pitched bowel sounds.  MUSCULOSKELETAL: There are no major deformities. NEUROLOGIC: No focal weakness or paresthesias are detected. SKIN: There are no ulcers or rashes noted. PSYCHIATRIC: The patient has a normal affect.  DATA:    ARTERIAL DOPPLER STUDY: I have reviewed the arterial Doppler study that was done yesterday.   On the right side there is a monophasic posterior tibial signal.  There is no dorsalis pedis signal.  ABI could not be obtained as the artery is not compressible.  Toe pressures 0.  On the left side there is a monophasic posterior tibial signal.  There is a dampened monophasic dorsalis pedis signal.  The arteries are noncompressible and ABI cannot be obtained.  LABS: I reviewed the labs from 12/03/2021.  GFR  was greater than 60.  Creatinine 0.8.  White blood cell count 7.0.  Hemoglobin 13.5.  Platelets 1 91,000  X-RAY LEFT FOOT: This shows no evidence of osteomyelitis.  There is some dorsal soft tissue swelling along the forefoot.   Deitra Mayo [facility]

## 2021-12-13 ENCOUNTER — Other Ambulatory Visit: Payer: Self-pay

## 2021-12-13 ENCOUNTER — Encounter (HOSPITAL_COMMUNITY)
Admission: RE | Disposition: A | Discharge status: Home / Self Care | Payer: Self-pay | Source: Home / Self Care | Attending: Vascular Surgery

## 2021-12-13 ENCOUNTER — Ambulatory Visit (HOSPITAL_COMMUNITY)
Admission: RE | Admit: 2021-12-13 | Discharge: 2021-12-13 | Disposition: A | Discharge status: Home / Self Care | Payer: Medicare HMO | Attending: Vascular Surgery | Admitting: Vascular Surgery

## 2021-12-13 DIAGNOSIS — I70245 Atherosclerosis of native arteries of left leg with ulceration of other part of foot: Secondary | ICD-10-CM | POA: Insufficient documentation

## 2021-12-13 DIAGNOSIS — I2510 Atherosclerotic heart disease of native coronary artery without angina pectoris: Secondary | ICD-10-CM | POA: Diagnosis not present

## 2021-12-13 DIAGNOSIS — I4891 Unspecified atrial fibrillation: Secondary | ICD-10-CM | POA: Diagnosis not present

## 2021-12-13 DIAGNOSIS — Z993 Dependence on wheelchair: Secondary | ICD-10-CM | POA: Diagnosis not present

## 2021-12-13 DIAGNOSIS — L97319 Non-pressure chronic ulcer of right ankle with unspecified severity: Secondary | ICD-10-CM | POA: Diagnosis not present

## 2021-12-13 DIAGNOSIS — L97529 Non-pressure chronic ulcer of other part of left foot with unspecified severity: Secondary | ICD-10-CM | POA: Insufficient documentation

## 2021-12-13 DIAGNOSIS — F0390 Unspecified dementia without behavioral disturbance: Secondary | ICD-10-CM | POA: Diagnosis not present

## 2021-12-13 DIAGNOSIS — Z87891 Personal history of nicotine dependence: Secondary | ICD-10-CM | POA: Diagnosis not present

## 2021-12-13 DIAGNOSIS — I10 Essential (primary) hypertension: Secondary | ICD-10-CM | POA: Diagnosis not present

## 2021-12-13 DIAGNOSIS — I70233 Atherosclerosis of native arteries of right leg with ulceration of ankle: Secondary | ICD-10-CM | POA: Diagnosis not present

## 2021-12-13 HISTORY — PX: PERIPHERAL VASCULAR INTERVENTION: CATH118257

## 2021-12-13 HISTORY — PX: ABDOMINAL AORTOGRAM W/LOWER EXTREMITY: CATH118223

## 2021-12-13 LAB — POCT ACTIVATED CLOTTING TIME: Activated Clotting Time: 221 s

## 2021-12-13 SURGERY — ABDOMINAL AORTOGRAM W/LOWER EXTREMITY
Anesthesia: LOCAL

## 2021-12-13 MED ORDER — SODIUM CHLORIDE 0.9 % IV SOLN: INTRAVENOUS | Status: DC

## 2021-12-13 MED ORDER — SODIUM CHLORIDE 0.9% FLUSH: 3.0000 mL | INTRAVENOUS | Status: DC | PRN

## 2021-12-13 MED ORDER — HEPARIN SODIUM (PORCINE) 1000 UNIT/ML IJ SOLN
INTRAMUSCULAR | Status: AC
  Filled 2021-12-13: qty 10

## 2021-12-13 MED ORDER — HEPARIN (PORCINE) IN NACL 1000-0.9 UT/500ML-% IV SOLN
INTRAVENOUS | Status: AC
  Filled 2021-12-13: qty 1000

## 2021-12-13 MED ORDER — METHYLPREDNISOLONE SODIUM SUCC 125 MG IJ SOLR
125.0000 mg | Freq: Once | INTRAMUSCULAR | Status: AC
  Administered 2021-12-13: 125 mg via INTRAVENOUS
  Filled 2021-12-13: qty 2

## 2021-12-13 MED ORDER — HEPARIN (PORCINE) IN NACL 1000-0.9 UT/500ML-% IV SOLN
INTRAVENOUS | Status: DC | PRN
  Administered 2021-12-13 (×2): 500 mL

## 2021-12-13 MED ORDER — LABETALOL HCL 5 MG/ML IV SOLN: 10.0000 mg | INTRAVENOUS | Status: DC | PRN

## 2021-12-13 MED ORDER — CLOPIDOGREL BISULFATE 75 MG PO TABS: 75.0000 mg | ORAL_TABLET | Freq: Every day | ORAL | Status: DC

## 2021-12-13 MED ORDER — SODIUM CHLORIDE 0.9% FLUSH: 3.0000 mL | Freq: Two times a day (BID) | INTRAVENOUS | Status: DC

## 2021-12-13 MED ORDER — LIDOCAINE HCL (PF) 1 % IJ SOLN
INTRAMUSCULAR | Status: DC | PRN
  Administered 2021-12-13: 15 mL

## 2021-12-13 MED ORDER — DIPHENHYDRAMINE HCL 50 MG/ML IJ SOLN
25.0000 mg | Freq: Once | INTRAMUSCULAR | Status: AC
  Administered 2021-12-13: 25 mg via INTRAVENOUS
  Filled 2021-12-13: qty 1

## 2021-12-13 MED ORDER — ACETAMINOPHEN 325 MG PO TABS: 650.0000 mg | ORAL_TABLET | ORAL | Status: DC | PRN

## 2021-12-13 MED ORDER — CLOPIDOGREL BISULFATE 300 MG PO TABS
ORAL_TABLET | ORAL | Status: AC
  Filled 2021-12-13: qty 1

## 2021-12-13 MED ORDER — HYDRALAZINE HCL 20 MG/ML IJ SOLN: 5.0000 mg | INTRAMUSCULAR | Status: DC | PRN

## 2021-12-13 MED ORDER — IODIXANOL 320 MG/ML IV SOLN
INTRAVENOUS | Status: DC | PRN
  Administered 2021-12-13: 90 mL via INTRA_ARTERIAL

## 2021-12-13 MED ORDER — HEPARIN SODIUM (PORCINE) 1000 UNIT/ML IJ SOLN
INTRAMUSCULAR | Status: DC | PRN
  Administered 2021-12-13: 6000 [IU] via INTRAVENOUS

## 2021-12-13 MED ORDER — CLOPIDOGREL BISULFATE 75 MG PO TABS: 300.0000 mg | ORAL_TABLET | Freq: Once | ORAL | Status: DC

## 2021-12-13 MED ORDER — ONDANSETRON HCL 4 MG/2ML IJ SOLN: 4.0000 mg | Freq: Four times a day (QID) | INTRAMUSCULAR | Status: DC | PRN

## 2021-12-13 MED ORDER — CLOPIDOGREL BISULFATE 300 MG PO TABS
ORAL_TABLET | ORAL | Status: DC | PRN
  Administered 2021-12-13: 300 mg via ORAL

## 2021-12-13 MED ORDER — SODIUM CHLORIDE 0.9 % IV SOLN: 250.0000 mL | INTRAVENOUS | Status: DC | PRN

## 2021-12-13 MED ORDER — LIDOCAINE HCL (PF) 1 % IJ SOLN
INTRAMUSCULAR | Status: AC
  Filled 2021-12-13: qty 30

## 2021-12-13 MED ORDER — SODIUM CHLORIDE 0.9 % WEIGHT BASED INFUSION: 1.0000 mL/kg/h | INTRAVENOUS | Status: DC

## 2021-12-13 SURGICAL SUPPLY — 22 items
BALLN MUSTANG 5X60X135 (BALLOONS) ×2
BALLOON MUSTANG 5X60X135 (BALLOONS) IMPLANT
CATH NAVICROSS ST 65CM (CATHETERS) IMPLANT
CATH OMNI FLUSH 5F 65CM (CATHETERS) IMPLANT
CATH QUICKCROSS SUPP .035X90CM (MICROCATHETER) IMPLANT
CATHETER NAVICROSS ST 65CM (CATHETERS) ×2
CLOSURE MYNX CONTROL 6F/7F (Vascular Products) IMPLANT
GLIDEWIRE ADV .035X260CM (WIRE) IMPLANT
KIT ENCORE 26 ADVANTAGE (KITS) IMPLANT
KIT MICROPUNCTURE NIT STIFF (SHEATH) IMPLANT
KIT PV (KITS) ×2 IMPLANT
SHEATH CATAPULT 6FR 45 (SHEATH) IMPLANT
SHEATH PINNACLE 5F 10CM (SHEATH) IMPLANT
SHEATH PINNACLE 6F 10CM (SHEATH) IMPLANT
SHEATH PROBE COVER 6X72 (BAG) IMPLANT
STENT ELUVIA 6X60X130 (Permanent Stent) IMPLANT
STOPCOCK MORSE 400PSI 3WAY (MISCELLANEOUS) IMPLANT
SYR MEDRAD MARK V 150ML (SYRINGE) IMPLANT
TRANSDUCER W/STOPCOCK (MISCELLANEOUS) ×2 IMPLANT
TRAY PV CATH (CUSTOM PROCEDURE TRAY) ×2 IMPLANT
TUBING CIL FLEX 10 FLL-RA (TUBING) IMPLANT
WIRE BENTSON .035X145CM (WIRE) IMPLANT

## 2021-12-13 NOTE — Op Note (Signed)
Patient name: [name]. MRN: [id number] DOB: 23-Apr-1931 Sex: male  12/13/2021 Pre-operative Diagnosis: Chronic left lower extremity limb threatening ischemia with toe ulceration Post-operative diagnosis:  Same Surgeon:  Erlene Quan C. Donzetta Matters, MD Procedure Performed: 1.  Ultrasound-guided cannulation right common femoral artery 2.  Aortogram and bilateral lower extremity angiography 3.  Stent of left SFA with 6 x 60 mm Elluvia 4.  mynx device closure right common femoral artery   Indications: 88 year old male with chronic bilateral lower extremity threatening ischemia with ulceration of the left great toe and right medial ankle severe depressed ABIs.  Motor pressure calculated on the left given the ulceration.  He indicated for angiography with possible intervention.  Findings: Aortoiliac segments are heavily calcified although there is no flow-limiting stenosis.  Bilateral renal arteries are patent.  Left common femoral artery heavily calcified with approximate 50% stenosis that was not treated.  Left SFA also heavily calcified there is 1 area in the mid segment with 80% stenosis that was stented to less than 20% residual stenosis with heavy calcification.  Distally on the left there is approximately 40% stenosis of the distal SFA proximal popliteal artery and then below the knee there is no visible anterior tibial artery.  The peroneal artery has initial 50% stenosis within the runoff to the ankle.  Posterior tibial artery initially has runoff within appears to occlude for proximally 2 cm in the distal segment and then reconstitutes into the foot and no intervention was undertaken below the knee.  On the right side the patient has heavily calcified common femoral artery at least 50% stenosis and then heavily calcified SFA and popliteal arteries above the knee and are patent appears to be occluded at the below the knee popliteal artery level with reconstitution of posterior tibial  artery.  Patient to follow-up in a few weeks to evaluate for wound healing and can consider scheduling right lower extremity angiogram at that time.   Procedure:  The patient was identified in the holding area and taken to room 8.  The patient was then placed supine on the table and prepped and draped in the usual sterile fashion.  A time out was called.  Ultrasound was used to evaluate the right common femoral artery which was calcified.  The area was anesthetized with 1% lidocaine cannulated micropuncture needle followed by wire and sheath.  Images saved the permanent record.  We placed a Bentson wire followed by 5 [place] sheath performed aortogram.  We then crossed the bifurcation with Bentson wire and Navi cross catheter for left lower extremity angiogram.  With the above findings we elected for treatment.  We placed a Glidewire advantage followed by a long 6 [place] sheath patient was heparinized with 6000 units of heparin.  We then crossed into the distal popliteal artery performed further angiography below the knee to better define the tibial vessels.  We then primarily stented the SFA with a 6 x 60 mm drug-eluting stent and postdilated this with 5 mm balloon to 0% residual stenosis.  At this time we retracted the sheath into the right external iliac artery and perform right lower extremity angiography which was limited by patient's contracture of his right knee and motion degradation.  We then exchanged for a short 6 French sheath over the Glidewire advantage and then deployed a minx device.  He tolerated the procedure without any complication.  Contrast: 90 cc     Kyrese Gartman C. Donzetta Matters, MD [facility] Office:  [phone] Pager: [phone]

## 2021-12-13 NOTE — Interval H&P Note (Signed)
History and Physical Interval Note:  12/13/2021 9:46 AM  [name].  has presented today for surgery, with the diagnosis of pad with ulcer.  The various methods of treatment have been discussed with the patient and family. After consideration of risks, benefits and other options for treatment, the patient has consented to  Procedure(s): ABDOMINAL AORTOGRAM W/LOWER EXTREMITY (N/A) as a surgical intervention.  The patient's history has been reviewed, patient examined, no change in status, stable for surgery.  I have reviewed the patient's chart and labs.  Questions were answered to the patient's satisfaction.     Servando Snare

## 2021-12-13 NOTE — Interval H&P Note (Signed)
History and Physical Interval Note:  12/13/2021 8:23 AM  [name].  has presented today for surgery, with the diagnosis of pad with ulcer.  The various methods of treatment have been discussed with the patient and family. After consideration of risks, benefits and other options for treatment, the patient has consented to  Procedure(s): ABDOMINAL AORTOGRAM W/LOWER EXTREMITY (N/A) as a surgical intervention.  The patient's history has been reviewed, patient examined, no change in status, stable for surgery.  I have reviewed the patient's chart and labs.  Questions were answered to the patient's satisfaction.     Servando Snare

## 2021-12-14 ENCOUNTER — Encounter: Payer: Medicare HMO | Admitting: Vascular Surgery

## 2021-12-14 ENCOUNTER — Encounter (HOSPITAL_COMMUNITY): Payer: Self-pay | Admitting: Vascular Surgery

## 2021-12-14 ENCOUNTER — Encounter (HOSPITAL_COMMUNITY): Payer: Medicare HMO

## 2021-12-15 ENCOUNTER — Ambulatory Visit (INDEPENDENT_AMBULATORY_CARE_PROVIDER_SITE_OTHER): Payer: Medicare HMO | Admitting: Family Medicine

## 2021-12-15 NOTE — Progress Notes (Signed)
Home Health scheduled to see pt on Saturday, appointment not needed.

## 2021-12-18 ENCOUNTER — Other Ambulatory Visit: Payer: Self-pay | Admitting: Family Medicine

## 2021-12-18 DIAGNOSIS — I5023 Acute on chronic systolic (congestive) heart failure: Secondary | ICD-10-CM | POA: Diagnosis not present

## 2021-12-18 DIAGNOSIS — L97319 Non-pressure chronic ulcer of right ankle with unspecified severity: Secondary | ICD-10-CM | POA: Diagnosis not present

## 2021-12-18 DIAGNOSIS — S91101D Unspecified open wound of right great toe without damage to nail, subsequent encounter: Secondary | ICD-10-CM | POA: Diagnosis not present

## 2021-12-18 DIAGNOSIS — L97819 Non-pressure chronic ulcer of other part of right lower leg with unspecified severity: Secondary | ICD-10-CM | POA: Diagnosis not present

## 2021-12-18 DIAGNOSIS — I110 Hypertensive heart disease with heart failure: Secondary | ICD-10-CM | POA: Diagnosis not present

## 2021-12-18 DIAGNOSIS — S91102D Unspecified open wound of left great toe without damage to nail, subsequent encounter: Secondary | ICD-10-CM | POA: Diagnosis not present

## 2021-12-18 DIAGNOSIS — F0394 Unspecified dementia, unspecified severity, with anxiety: Secondary | ICD-10-CM | POA: Diagnosis not present

## 2021-12-18 DIAGNOSIS — L03115 Cellulitis of right lower limb: Secondary | ICD-10-CM | POA: Diagnosis not present

## 2021-12-18 DIAGNOSIS — R600 Localized edema: Secondary | ICD-10-CM

## 2021-12-18 DIAGNOSIS — R609 Edema, unspecified: Secondary | ICD-10-CM

## 2021-12-18 DIAGNOSIS — I872 Venous insufficiency (chronic) (peripheral): Secondary | ICD-10-CM | POA: Diagnosis not present

## 2021-12-20 ENCOUNTER — Telehealth: Payer: Self-pay | Admitting: Cardiology

## 2021-12-20 NOTE — Telephone Encounter (Signed)
Spoke to [place] with [facility] calling to report when she saw patient this past Friday.He is now bedridden.He has not walked in over 1 month.He is sob just rolling over in bed.He has gained 7 lbs within 13 days.Lower legs are swollen.Stated he saw PCP last week and was prescribed Metolazone 2.5 mg to take for 2 days only.Wife never got filled.Wife told her she will have filled and give to him today.She noticed a bag of cheetos at his bedside.Wife stated that is all he will eat.Kenney Houseman educated wife on a low salt diet and the importance on giving his medications as prescribed.She recommended going to a nursing home.Stated patient and his wife refused.I will make Dr.Hochrein aware.

## 2021-12-20 NOTE — Telephone Encounter (Signed)
Pt c/o swelling: STAT is pt has developed SOB within 24 hours  How much weight have you gained and in what time span? 7 lbs 13 days as of Nov 7th --hasn't checked since.   If swelling, where is the swelling located? Pitted edema in abdomen   Are you currently taking a fluid pill? Yes   Are you currently SOB? A little winded, but not SOB states the [place]   Do you have a log of your daily weights (if so, list)?   Have you gained 3 pounds in a day or 5 pounds in a week? No   Have you traveled recently? No  Patient's nurse is calling with concerns on medication and swelling.

## 2021-12-21 ENCOUNTER — Telehealth: Payer: Self-pay | Admitting: *Deleted

## 2021-12-21 NOTE — Telephone Encounter (Signed)
FYI: TC from Simona Huh PT w/ [facility] Pt had a fall yesterday in the bathroom, minor scrapes & bruises, EMS came and helped him up. He has not gotten out of the bed since and is not wanting to get out today for PT.

## 2021-12-22 ENCOUNTER — Inpatient Hospital Stay (HOSPITAL_COMMUNITY): Payer: Medicare HMO

## 2021-12-22 ENCOUNTER — Encounter (HOSPITAL_COMMUNITY): Payer: Self-pay | Admitting: *Deleted

## 2021-12-22 ENCOUNTER — Emergency Department (HOSPITAL_COMMUNITY): Payer: Medicare HMO

## 2021-12-22 ENCOUNTER — Inpatient Hospital Stay (HOSPITAL_COMMUNITY)
Admission: EM | Admit: 2021-12-22 | Discharge: 2021-12-27 | DRG: 291 | Disposition: A | Discharge status: Hospice | Payer: Medicare HMO | Attending: Internal Medicine | Admitting: Internal Medicine

## 2021-12-22 ENCOUNTER — Other Ambulatory Visit (HOSPITAL_COMMUNITY): Payer: Self-pay | Admitting: *Deleted

## 2021-12-22 DIAGNOSIS — K219 Gastro-esophageal reflux disease without esophagitis: Secondary | ICD-10-CM | POA: Diagnosis present

## 2021-12-22 DIAGNOSIS — Z993 Dependence on wheelchair: Secondary | ICD-10-CM | POA: Diagnosis not present

## 2021-12-22 DIAGNOSIS — I5021 Acute systolic (congestive) heart failure: Secondary | ICD-10-CM | POA: Diagnosis not present

## 2021-12-22 DIAGNOSIS — R0603 Acute respiratory distress: Secondary | ICD-10-CM | POA: Diagnosis not present

## 2021-12-22 DIAGNOSIS — I5023 Acute on chronic systolic (congestive) heart failure: Secondary | ICD-10-CM

## 2021-12-22 DIAGNOSIS — E782 Mixed hyperlipidemia: Secondary | ICD-10-CM | POA: Diagnosis present

## 2021-12-22 DIAGNOSIS — I272 Pulmonary hypertension, unspecified: Secondary | ICD-10-CM | POA: Diagnosis present

## 2021-12-22 DIAGNOSIS — Z885 Allergy status to narcotic agent status: Secondary | ICD-10-CM

## 2021-12-22 DIAGNOSIS — I361 Nonrheumatic tricuspid (valve) insufficiency: Secondary | ICD-10-CM | POA: Diagnosis present

## 2021-12-22 DIAGNOSIS — I739 Peripheral vascular disease, unspecified: Secondary | ICD-10-CM | POA: Diagnosis present

## 2021-12-22 DIAGNOSIS — I110 Hypertensive heart disease with heart failure: Principal | ICD-10-CM | POA: Diagnosis present

## 2021-12-22 DIAGNOSIS — Z87891 Personal history of nicotine dependence: Secondary | ICD-10-CM | POA: Diagnosis not present

## 2021-12-22 DIAGNOSIS — J9602 Acute respiratory failure with hypercapnia: Secondary | ICD-10-CM

## 2021-12-22 DIAGNOSIS — I25810 Atherosclerosis of coronary artery bypass graft(s) without angina pectoris: Secondary | ICD-10-CM | POA: Diagnosis not present

## 2021-12-22 DIAGNOSIS — I5082 Biventricular heart failure: Secondary | ICD-10-CM | POA: Diagnosis present

## 2021-12-22 DIAGNOSIS — Z85828 Personal history of other malignant neoplasm of skin: Secondary | ICD-10-CM | POA: Diagnosis not present

## 2021-12-22 DIAGNOSIS — F03B0 Unspecified dementia, moderate, without behavioral disturbance, psychotic disturbance, mood disturbance, and anxiety: Secondary | ICD-10-CM | POA: Diagnosis not present

## 2021-12-22 DIAGNOSIS — Z801 Family history of malignant neoplasm of trachea, bronchus and lung: Secondary | ICD-10-CM

## 2021-12-22 DIAGNOSIS — I509 Heart failure, unspecified: Secondary | ICD-10-CM | POA: Diagnosis not present

## 2021-12-22 DIAGNOSIS — J189 Pneumonia, unspecified organism: Secondary | ICD-10-CM | POA: Diagnosis not present

## 2021-12-22 DIAGNOSIS — R32 Unspecified urinary incontinence: Secondary | ICD-10-CM | POA: Diagnosis present

## 2021-12-22 DIAGNOSIS — Z7982 Long term (current) use of aspirin: Secondary | ICD-10-CM

## 2021-12-22 DIAGNOSIS — Z79899 Other long term (current) drug therapy: Secondary | ICD-10-CM

## 2021-12-22 DIAGNOSIS — N5089 Other specified disorders of the male genital organs: Secondary | ICD-10-CM | POA: Diagnosis present

## 2021-12-22 DIAGNOSIS — Z823 Family history of stroke: Secondary | ICD-10-CM

## 2021-12-22 DIAGNOSIS — E876 Hypokalemia: Secondary | ICD-10-CM | POA: Diagnosis present

## 2021-12-22 DIAGNOSIS — L02415 Cutaneous abscess of right lower limb: Secondary | ICD-10-CM | POA: Diagnosis not present

## 2021-12-22 DIAGNOSIS — I350 Nonrheumatic aortic (valve) stenosis: Secondary | ICD-10-CM | POA: Diagnosis present

## 2021-12-22 DIAGNOSIS — Z7189 Other specified counseling: Secondary | ICD-10-CM | POA: Diagnosis not present

## 2021-12-22 DIAGNOSIS — Z7401 Bed confinement status: Secondary | ICD-10-CM

## 2021-12-22 DIAGNOSIS — E559 Vitamin D deficiency, unspecified: Secondary | ICD-10-CM | POA: Diagnosis present

## 2021-12-22 DIAGNOSIS — I429 Cardiomyopathy, unspecified: Secondary | ICD-10-CM | POA: Diagnosis present

## 2021-12-22 DIAGNOSIS — Z8249 Family history of ischemic heart disease and other diseases of the circulatory system: Secondary | ICD-10-CM

## 2021-12-22 DIAGNOSIS — Z91041 Radiographic dye allergy status: Secondary | ICD-10-CM

## 2021-12-22 DIAGNOSIS — I4819 Other persistent atrial fibrillation: Secondary | ICD-10-CM

## 2021-12-22 DIAGNOSIS — Z881 Allergy status to other antibiotic agents status: Secondary | ICD-10-CM

## 2021-12-22 DIAGNOSIS — R531 Weakness: Secondary | ICD-10-CM | POA: Diagnosis not present

## 2021-12-22 DIAGNOSIS — F0390 Unspecified dementia without behavioral disturbance: Secondary | ICD-10-CM | POA: Diagnosis not present

## 2021-12-22 DIAGNOSIS — R339 Retention of urine, unspecified: Secondary | ICD-10-CM | POA: Diagnosis present

## 2021-12-22 DIAGNOSIS — R296 Repeated falls: Secondary | ICD-10-CM | POA: Diagnosis present

## 2021-12-22 DIAGNOSIS — Z66 Do not resuscitate: Secondary | ICD-10-CM | POA: Diagnosis not present

## 2021-12-22 DIAGNOSIS — Z951 Presence of aortocoronary bypass graft: Secondary | ICD-10-CM | POA: Diagnosis not present

## 2021-12-22 DIAGNOSIS — R0602 Shortness of breath: Secondary | ICD-10-CM | POA: Diagnosis not present

## 2021-12-22 DIAGNOSIS — R0902 Hypoxemia: Secondary | ICD-10-CM | POA: Diagnosis not present

## 2021-12-22 DIAGNOSIS — I10 Essential (primary) hypertension: Secondary | ICD-10-CM

## 2021-12-22 DIAGNOSIS — R54 Age-related physical debility: Secondary | ICD-10-CM | POA: Diagnosis present

## 2021-12-22 DIAGNOSIS — Z888 Allergy status to other drugs, medicaments and biological substances status: Secondary | ICD-10-CM

## 2021-12-22 DIAGNOSIS — R609 Edema, unspecified: Secondary | ICD-10-CM | POA: Diagnosis not present

## 2021-12-22 DIAGNOSIS — J9601 Acute respiratory failure with hypoxia: Secondary | ICD-10-CM | POA: Diagnosis not present

## 2021-12-22 DIAGNOSIS — Z20822 Contact with and (suspected) exposure to covid-19: Secondary | ICD-10-CM | POA: Diagnosis present

## 2021-12-22 DIAGNOSIS — I2510 Atherosclerotic heart disease of native coronary artery without angina pectoris: Secondary | ICD-10-CM | POA: Diagnosis present

## 2021-12-22 DIAGNOSIS — R000 Tachycardia, unspecified: Secondary | ICD-10-CM | POA: Diagnosis not present

## 2021-12-22 DIAGNOSIS — Z833 Family history of diabetes mellitus: Secondary | ICD-10-CM | POA: Diagnosis not present

## 2021-12-22 DIAGNOSIS — L03115 Cellulitis of right lower limb: Secondary | ICD-10-CM | POA: Diagnosis not present

## 2021-12-22 DIAGNOSIS — J90 Pleural effusion, not elsewhere classified: Secondary | ICD-10-CM | POA: Diagnosis not present

## 2021-12-22 DIAGNOSIS — Z841 Family history of disorders of kidney and ureter: Secondary | ICD-10-CM

## 2021-12-22 DIAGNOSIS — Z8679 Personal history of other diseases of the circulatory system: Secondary | ICD-10-CM | POA: Diagnosis not present

## 2021-12-22 LAB — CBC WITH DIFFERENTIAL/PLATELET
Abs Immature Granulocytes: 0.08 10*3/uL — ABNORMAL HIGH (ref 0.00–0.07)
Basophils Absolute: 0 10*3/uL (ref 0.0–0.1)
Basophils Relative: 0 %
Eosinophils Absolute: 0 10*3/uL (ref 0.0–0.5)
Eosinophils Relative: 0 %
HCT: 44.1 % (ref 39.0–52.0)
Hemoglobin: 14.7 g/dL (ref 13.0–17.0)
Immature Granulocytes: 1 %
Lymphocytes Relative: 5 %
Lymphs Abs: 0.4 10*3/uL — ABNORMAL LOW (ref 0.7–4.0)
MCH: 32.1 pg (ref 26.0–34.0)
MCHC: 33.3 g/dL (ref 30.0–36.0)
MCV: 96.3 fL (ref 80.0–100.0)
Monocytes Absolute: 0.9 10*3/uL (ref 0.1–1.0)
Monocytes Relative: 13 %
Neutro Abs: 5.7 10*3/uL (ref 1.7–7.7)
Neutrophils Relative %: 81 %
Platelets: 154 10*3/uL (ref 150–400)
RBC: 4.58 MIL/uL (ref 4.22–5.81)
RDW: 18.6 % — ABNORMAL HIGH (ref 11.5–15.5)
WBC: 7.1 10*3/uL (ref 4.0–10.5)
nRBC: 0 % (ref 0.0–0.2)

## 2021-12-22 LAB — BASIC METABOLIC PANEL
Anion gap: 14 (ref 5–15)
BUN: 34 mg/dL (ref 8–23)
CO2: 21 mmol/L (ref 22–32)
Calcium: 8.2 mg/dL (ref 8.9–10.3)
Chloride: 102 mmol/L (ref 98–111)
Creatinine, Ser: 0.97 mg/dL (ref 0.61–1.24)
GFR, Estimated: >60 mL/min (ref >60)
Glucose, Bld: 80 mg/dL (ref 70–99)
Potassium: 5.3 mmol/L (ref 3.5–5.1)
Sodium: 137 mmol/L (ref 135–145)

## 2021-12-22 LAB — GLUCOSE, CAPILLARY
Glucose-Capillary: 70 mg/dL (ref 70–99)
Glucose-Capillary: 62 mg/dL — ABNORMAL LOW (ref 70–99)
Glucose-Capillary: 103 mg/dL — ABNORMAL HIGH (ref 70–99)
Glucose-Capillary: 84 mg/dL (ref 70–99)

## 2021-12-22 LAB — COMPREHENSIVE METABOLIC PANEL
ALT: 26 U/L (ref 0–44)
AST: 29 U/L (ref 15–41)
Albumin: 2.7 g/dL — ABNORMAL LOW (ref 3.5–5.0)
Alkaline Phosphatase: 130 U/L — ABNORMAL HIGH (ref 38–126)
Anion gap: 11 (ref 5–15)
BUN: 36 mg/dL — ABNORMAL HIGH (ref 8–23)
CO2: 26 mmol/L (ref 22–32)
Calcium: 8.5 mg/dL — ABNORMAL LOW (ref 8.9–10.3)
Chloride: 100 mmol/L (ref 98–111)
Creatinine, Ser: 1.09 mg/dL (ref 0.61–1.24)
GFR, Estimated: >60 mL/min (ref >60)
Glucose, Bld: 86 mg/dL (ref 70–99)
Potassium: 5.3 mmol/L — ABNORMAL HIGH (ref 3.5–5.1)
Sodium: 137 mmol/L (ref 135–145)
Total Bilirubin: 2.1 mg/dL — ABNORMAL HIGH (ref 0.3–1.2)
Total Protein: 6.1 g/dL — ABNORMAL LOW (ref 6.5–8.1)

## 2021-12-22 LAB — BLOOD GAS, VENOUS
Acid-Base Excess: 7 mmol/L — ABNORMAL HIGH (ref 0.0–2.0)
Acid-Base Excess: 7 mmol/L — ABNORMAL HIGH (ref 0.0–2.0)
Bicarbonate: 34.5 mmol/L — ABNORMAL HIGH (ref 20.0–28.0)
Bicarbonate: 34.8 mmol/L — ABNORMAL HIGH (ref 20.0–28.0)
Drawn by: 44828
Drawn by: 7012
O2 Saturation: 19.7 %
O2 Saturation: 47.9 %
Patient temperature: 35.8
Patient temperature: 36.4
pCO2, Ven: 58 mmHg (ref 44–60)
pCO2, Ven: 61 mmHg — ABNORMAL HIGH (ref 44–60)
pH, Ven: 7.38 (ref 7.25–7.43)
pH, Ven: 7.36 (ref 7.25–7.43)
pO2, Ven: <31 mmHg — CL (ref 32–45)
pO2, Ven: 35 mmHg (ref 32–45)

## 2021-12-22 LAB — ECHOCARDIOGRAM COMPLETE
AR max vel: 0.43 cm2
AV Area VTI: 0.35 cm2
AV Area mean vel: 0.44 cm2
AV Mean grad: 23 mmHg
AV Peak grad: 36.8 mmHg
Ao pk vel: 3.04 m/s
Area-P 1/2: 5.02 cm2
Height: 74 in
MV M vel: 2.83 m/s
MV Peak grad: 32 mmHg
S' Lateral: 3.1 cm
Weight: 2797.2 oz

## 2021-12-22 LAB — PROTIME-INR
INR: 1.5 — ABNORMAL HIGH (ref 0.8–1.2)
Prothrombin Time: 18 seconds — ABNORMAL HIGH (ref 11.4–15.2)

## 2021-12-22 LAB — MRSA NEXT GEN BY PCR, NASAL: MRSA by PCR Next Gen: NOT DETECTED

## 2021-12-22 LAB — RESP PANEL BY RT-PCR (FLU A&B, COVID) ARPGX2
Influenza A by PCR: NEGATIVE
Influenza B by PCR: NEGATIVE
SARS Coronavirus 2 by RT PCR: NEGATIVE

## 2021-12-22 LAB — URINALYSIS, ROUTINE W REFLEX MICROSCOPIC
Bilirubin Urine: NEGATIVE
Glucose, UA: NEGATIVE mg/dL
Hgb urine dipstick: NEGATIVE
Ketones, ur: NEGATIVE mg/dL
Leukocytes,Ua: NEGATIVE
Nitrite: NEGATIVE
Protein, ur: NEGATIVE mg/dL
Specific Gravity, Urine: 1.009 (ref 1.005–1.030)
pH: 5 (ref 5.0–8.0)

## 2021-12-22 LAB — LACTIC ACID, PLASMA
Lactic Acid, Venous: 3.2 mmol/L (ref 0.5–1.9)
Lactic Acid, Venous: 2.8 mmol/L (ref 0.5–1.9)

## 2021-12-22 LAB — PROCALCITONIN: Procalcitonin: 0.14 ng/mL

## 2021-12-22 LAB — TROPONIN I (HIGH SENSITIVITY)
Troponin I (High Sensitivity): 15 ng/L (ref <18)
Troponin I (High Sensitivity): 12 ng/L (ref <18)

## 2021-12-22 LAB — BRAIN NATRIURETIC PEPTIDE: B Natriuretic Peptide: 1048 pg/mL — ABNORMAL HIGH (ref 0.0–100.0)

## 2021-12-22 MED ORDER — VANCOMYCIN HCL 1500 MG/300ML IV SOLN
1500.0000 mg | Freq: Once | INTRAVENOUS | Status: AC
  Administered 2021-12-22: 1500 mg via INTRAVENOUS
  Filled 2021-12-22: qty 300

## 2021-12-22 MED ORDER — ACETAMINOPHEN 325 MG PO TABS
650.0000 mg | ORAL_TABLET | ORAL | Status: DC | PRN
  Administered 2021-12-24 – 2021-12-26 (×2): 650 mg via ORAL
  Filled 2021-12-22 (×2): qty 2

## 2021-12-22 MED ORDER — CHLORHEXIDINE GLUCONATE CLOTH 2 % EX PADS
6.0000 | MEDICATED_PAD | Freq: Every day | CUTANEOUS | Status: DC
  Administered 2021-12-23 – 2021-12-25 (×4): 6 via TOPICAL

## 2021-12-22 MED ORDER — FUROSEMIDE 10 MG/ML IJ SOLN
40.0000 mg | Freq: Once | INTRAMUSCULAR | Status: AC
  Administered 2021-12-22: 40 mg via INTRAVENOUS
  Filled 2021-12-22: qty 4

## 2021-12-22 MED ORDER — ONDANSETRON HCL 4 MG/2ML IJ SOLN: 4.0000 mg | Freq: Four times a day (QID) | INTRAMUSCULAR | Status: DC | PRN

## 2021-12-22 MED ORDER — SODIUM CHLORIDE 0.9% FLUSH
3.0000 mL | Freq: Two times a day (BID) | INTRAVENOUS | Status: DC
  Administered 2021-12-22 – 2021-12-27 (×11): 3 mL via INTRAVENOUS

## 2021-12-22 MED ORDER — PERFLUTREN LIPID MICROSPHERE
1.0000 mL | INTRAVENOUS | Status: AC | PRN
  Administered 2021-12-22: 3 mL via INTRAVENOUS

## 2021-12-22 MED ORDER — HEPARIN SODIUM (PORCINE) 5000 UNIT/ML IJ SOLN
5000.0000 [IU] | Freq: Three times a day (TID) | INTRAMUSCULAR | Status: DC
  Administered 2021-12-22 – 2021-12-27 (×15): 5000 [IU] via SUBCUTANEOUS
  Filled 2021-12-22 (×15): qty 1

## 2021-12-22 MED ORDER — DEXTROSE 50 % IV SOLN
12.5000 g | INTRAVENOUS | Status: AC
  Administered 2021-12-22: 12.5 g via INTRAVENOUS
  Filled 2021-12-22: qty 50

## 2021-12-22 MED ORDER — ORAL CARE MOUTH RINSE: 15.0000 mL | OROMUCOSAL | Status: DC | PRN

## 2021-12-22 MED ORDER — SODIUM CHLORIDE 0.9% FLUSH: 3.0000 mL | INTRAVENOUS | Status: DC | PRN

## 2021-12-22 MED ORDER — SODIUM CHLORIDE 0.9 % IV SOLN: 250.0000 mL | INTRAVENOUS | Status: DC | PRN

## 2021-12-22 MED ORDER — FUROSEMIDE 10 MG/ML IJ SOLN
40.0000 mg | Freq: Two times a day (BID) | INTRAMUSCULAR | Status: DC
  Administered 2021-12-22 – 2021-12-25 (×7): 40 mg via INTRAVENOUS
  Filled 2021-12-22 (×7): qty 4

## 2021-12-22 MED ORDER — ORAL CARE MOUTH RINSE
15.0000 mL | OROMUCOSAL | Status: DC
  Administered 2021-12-22 – 2021-12-27 (×17): 15 mL via OROMUCOSAL

## 2021-12-22 MED ORDER — ASPIRIN 81 MG PO CHEW
81.0000 mg | CHEWABLE_TABLET | Freq: Every day | ORAL | Status: DC
  Administered 2021-12-23 – 2021-12-27 (×5): 81 mg via ORAL
  Filled 2021-12-22 (×5): qty 1

## 2021-12-22 MED ORDER — SODIUM CHLORIDE 0.9 % IV SOLN
2.0000 g | Freq: Once | INTRAVENOUS | Status: AC
  Administered 2021-12-22: 2 g via INTRAVENOUS
  Filled 2021-12-22: qty 12.5

## 2021-12-22 NOTE — ED Provider Notes (Signed)
[facility] EMERGENCY DEPARTMENT Provider Note  CSN: [id number] Arrival date & time: 12/22/21 5885  Chief Complaint(s) No chief complaint on file.  HPI Danny [name]. is a 88 y.o. male history of A-fib, coronary artery disease, hypertension, hyperlipidemia, dementia presenting to the emergency department from home for weakness.  Patient has had progressive weakness over the past week, to the point where today he could not get out of bed.  He typically uses a wheelchair for locomotion.  No fevers, chills, nausea, vomiting.  Patient niece reports that he has seemed to be more short of breath recently.  Patient denies any chest pain, abdominal pain, headaches.  Paramedics report he is very sleepy on the way here.  Patient recently admitted for stent in the leg placement.   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 01/14/2014   Vitamin D deficiency    Patient Active Problem List   Diagnosis Date Noted   Acute on chronic systolic CHF (congestive heart failure) ([place]) 12/22/2021   Hx of CABG 10/27/2021   Mixed hyperlipidemia 09/29/2021   Dementia, old age ([place]) 05/25/2021   Edema of both lower extremities 04/30/2020   Gastroesophageal reflux disease 04/30/2020   Pulmonary HTN ([place]) 02/11/2020   Vitamin D deficiency    Difficulty sleeping 10/02/2018   Paroxysmal atrial fibrillation ([place]) 08/01/2016   Thoracic aorta atherosclerosis ([place]) 01/14/2014   Hyperplasia of prostate 05/18/2010   Carotid stenosis 05/18/2010   Essential hypertension 05/19/2008   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 Authorizing Provider  acetaminophen (TYLENOL) 325 MG tablet Take 650 mg by mouth every 6 (six) hours as needed.   Yes [provider]  aspirin 81 MG chewable tablet Chew 81 mg by mouth daily. atrial fibrillation   Yes [provider]  cholecalciferol (VITAMIN D) 25 MCG (1000 UNIT) tablet Take 1,000 Units by mouth daily.   Yes [provider]  furosemide (LASIX) 20 MG tablet TAKE 1 TABLET EVERY DAY 10/18/21  Yes [name] F, FNP  levocetirizine (XYZAL) 5 MG tablet TAKE 1 TABLET EVERY EVENING 11/16/21  Yes Rakes, Connye Burkitt, FNP  metoprolol succinate (TOPROL-XL) 25 MG 24 hr tablet Take 1 tablet (25 mg total) by mouth daily. 10/27/21  Yes Rakes, Connye Burkitt, FNP  omeprazole (PRILOSEC) 20 MG capsule Take 1 capsule (20 mg total) by mouth 2 (two) times daily before a meal. 06/29/21  Yes [name] F, FNP  potassium chloride SA (KLOR-CON M) 20 MEQ tablet Take 2 tablets (40 mEq total) by mouth daily. 07/19/21  Yes [name] F, FNP  metolazone (ZAROXOLYN) 2.5 MG tablet Take 1 tablet (2.5 mg total) by mouth daily for 2 days. Take 30 minutes prior to lasix 11/30/21 12/02/21  Rakes, Connye Burkitt, FNP  Past Surgical History Past Surgical History:  Procedure Laterality Date   ABDOMINAL AORTOGRAM W/LOWER EXTREMITY N/A 12/13/2021   Procedure: ABDOMINAL AORTOGRAM W/LOWER EXTREMITY;  Surgeon: Waynetta Sandy, MD;  Location: [place] CV LAB;  Service: Cardiovascular;  Laterality: N/A;   CARDIOVERSION N/A 05/05/2016   Procedure: CARDIOVERSION;  Surgeon: Josue Hector, MD;  Location: [facility] ENDOSCOPY;  Service: Cardiovascular;  Laterality: N/A;   CAROTID ENDARTERECTOMY Left 2003   CAROTID-SUBCLAVIAN BYPASS GRAFT Right    COLONOSCOPY  2005   hyperplastic polyp  x 1 with no adenoma   COLONOSCOPY N/A 04/11/2014   VVO:HYWV diverticulosis in the sigmoid colon/left colon is redundant   CORONARY ARTERY BYPASS GRAFT     ESOPHAGEAL DILATION N/A 04/11/2014   Procedure: ESOPHAGEAL DILATION;  Surgeon: [name], MD;  Location[facility];  Service: Endoscopy;  Laterality: N/A;   ESOPHAGOGASTRODUODENOSCOPY N/A 04/11/2014   PXT:GGYIRSWNI at the gastro junction/moderate erosive/duodenal web   EYE SURGERY Bilateral    cataracts   PERIPHERAL VASCULAR INTERVENTION  12/13/2021   Procedure: PERIPHERAL VASCULAR INTERVENTION;  Surgeon: Waynetta Sandy, MD;  Location: [place] CV LAB;  Service: Cardiovascular;;   RIGHT/LEFT HEART CATH AND CORONARY/GRAFT ANGIOGRAPHY N/A 02/11/2020   Procedure: RIGHT/LEFT HEART CATH AND CORONARY/GRAFT ANGIOGRAPHY;  Surgeon: Martinique, Peter M, MD;  Location: [place] [facility] LAB;  Service: Cardiovascular;  Laterality: N/A;   SKIN CANCER EXCISION     Family History Family History  Problem Relation Age of Onset   Heart disease Mother    Stroke Mother    Stroke Father    Diabetes Son    Stroke Son    Other Sister        ruptured appendix    Lung cancer Brother        asbestos   Mental illness Sister    Kidney disease Sister    Colon cancer Neg Hx        "not that I know of."    Social History Social History   Tobacco Use   Smoking status: Former    Types: Cigarettes    Start date: 01/24/1950    Quit date: 01/24/1961    Years since quitting: 60.9   Smokeless tobacco: Never  Vaping Use   Vaping Use: Never used  Substance Use Topics   Alcohol use: Not Currently    Alcohol/week: 0.0 standard drinks of alcohol    Comment: not drank since 12/2009   Drug use: No   Allergies Doxycycline, Contrast media [iodinated contrast media], Lunesta [eszopiclone], Morphine and related, and Trazodone and nefazodone  Review of Systems Review of Systems  All other systems reviewed and are negative.   Physical Exam Vital  Signs  I have reviewed the triage vital signs BP (!) 110/93   Pulse (!) 110   Resp (!) 23   SpO2 90%  Physical Exam Vitals and nursing note reviewed.  Constitutional:      General: He is in acute distress.     Appearance: He is ill-appearing.  HENT:     Head: Normocephalic and atraumatic.     Mouth/Throat:     Mouth: Mucous membranes are moist.  Eyes:     Conjunctiva/sclera: Conjunctivae normal.  Cardiovascular:     Rate and Rhythm: Normal rate and regular rhythm.  Pulmonary:     Comments: Tachypnea with mild respiratory distress and accessory muscle use.  Breath sounds decreased at the bases bilaterally Abdominal:     General: Abdomen is flat.  Palpations: Abdomen is soft.     Tenderness: There is no abdominal tenderness.  Musculoskeletal:     Right lower leg: Edema present.     Left lower leg: Edema present.  Skin:    General: Skin is warm and dry.     Capillary Refill: Capillary refill takes less than 2 seconds.  Neurological:     Mental Status: He is oriented to person, place, and time. Mental status is at baseline.  Psychiatric:        Mood and Affect: Mood normal.        Behavior: Behavior normal.     ED Results and Treatments Labs (all labs ordered are listed, but only abnormal results are displayed) Labs Reviewed  BLOOD GAS, VENOUS - Abnormal; Notable for the following components:      Result Value   pO2, Ven <31 (*)    Bicarbonate 34.5 (*)    Acid-Base Excess 7.0 (*)    All other components within normal limits  CBC WITH DIFFERENTIAL/PLATELET - Abnormal; Notable for the following components:   RDW 18.6 (*)    Lymphs Abs 0.4 (*)    Abs Immature Granulocytes 0.08 (*)    All other components within normal limits  LACTIC ACID, PLASMA - Abnormal; Notable for the following components:   Lactic Acid, Venous 3.2 (*)    All other components within normal limits  LACTIC ACID, PLASMA - Abnormal; Notable for the following components:   Lactic Acid, Venous 2.8  (*)    All other components within normal limits  BRAIN NATRIURETIC PEPTIDE - Abnormal; Notable for the following components:   B Natriuretic Peptide 1,048.0 (*)    All other components within normal limits  PROTIME-INR - Abnormal; Notable for the following components:   Prothrombin Time 18.0 (*)    INR 1.5 (*)    All other components within normal limits  RESP PANEL BY RT-PCR (FLU A&B, COVID) ARPGX2  CULTURE, BLOOD (ROUTINE X 2)  CULTURE, BLOOD (ROUTINE X 2)  MRSA NEXT GEN BY PCR, NASAL  URINALYSIS, ROUTINE W REFLEX MICROSCOPIC  TROPONIN I (HIGH SENSITIVITY)  TROPONIN I (HIGH SENSITIVITY)                                                                                                                          Radiology DG Chest Port 1 View  Result Date: 12/22/2021 CLINICAL DATA:  Weakness, respiratory distress EXAM: PORTABLE CHEST 1 VIEW COMPARISON:  02/08/2021 FINDINGS: Large right pleural effusion, increased since prior study. Moderate left pleural effusion, also increased. Increasing airspace disease throughout the right lung and in the left lower lobe. Heart is normal size. Prior CABG. IMPRESSION: Large right pleural effusion and moderate left pleural effusion. Airspace disease throughout the right lung and left lower lobe could reflect atelectasis or infiltrate/pneumonia. Electronically Signed   By: Rolm Baptise M.D.   On: 12/22/2021 09:53    Pertinent labs & imaging results that were available during my care of  the patient were reviewed by me and considered in my medical decision making (see MDM for details).  Medications Ordered in ED Medications  vancomycin (VANCOREADY) IVPB 1500 mg/300 mL (1,500 mg Intravenous New Bag/Given 12/22/21 1116)  furosemide (LASIX) injection 40 mg (40 mg Intravenous Given 12/22/21 1026)  ceFEPIme (MAXIPIME) 2 g in sodium chloride 0.9 % 100 mL IVPB (0 g Intravenous Stopping previously hung infusion 12/22/21 1115)                                                                                                                                      Procedures .Critical Care  Performed by: Cristie Hem, MD Authorized by: Cristie Hem, MD   Critical care provider statement:    Critical care time (minutes):  30   Critical care time was exclusive of:  Separately billable procedures and treating other patients   Critical care was necessary to treat or prevent imminent or life-threatening deterioration of the following conditions:  Respiratory failure, cardiac failure and sepsis   Critical care was time spent personally by me on the following activities:  Development of treatment plan with patient or surrogate, discussions with consultants, evaluation of patient's response to treatment, examination of patient, ordering and review of laboratory studies, ordering and review of radiographic studies, ordering and performing treatments and interventions, pulse oximetry, re-evaluation of patient's condition and review of old charts   Care discussed with: admitting provider     (including critical care time)  Medical Decision Making / ED Course   MDM:  88 year old male presenting to the emergency department with weakness.  Patient ill-appearing.  Patient in mild respiratory distress.  Patient was hypoxic to 75% on EMS arrival.  Patient only endorses generalized weakness, niece reports that he has had some shortness of breath over the past few days.  Suspect some component of volume overload.  Chest x-ray shows pleural effusions and also shows a possible pneumonia.  Given recent admission will start on antibiotics for hospital-acquired pneumonia.  Will obtain laboratory testing including BNP, CMP, CBC.  Given severe weakness and hypoxia, will also obtain blood cultures to evaluate for occult sepsis.  Patient not febrile in the emergency department.  Patient will need to be admitted for further management given his significant  hypoxia.  Clinical Course as of 12/22/21 1207  Wed Dec 22, 2021  1205 Signed out to the hospitalist was admitted the patient for further management.  Patient started on BiPAP.  Symptoms improving.  Also covered for possible pneumonia given chest x-ray findings. [WS]    Clinical Course User Index [WS] Cristie Hem, MD     Additional history obtained: -Additional history obtained from family and ems -External records from outside source obtained and reviewed including: Chart review including previous notes, labs, imaging, consultation notes including d/c summary from recent admission   Lab Tests: -I ordered, reviewed, and interpreted labs.   The  pertinent results include:   Labs Reviewed  BLOOD GAS, VENOUS - Abnormal; Notable for the following components:      Result Value   pO2, Ven <31 (*)    Bicarbonate 34.5 (*)    Acid-Base Excess 7.0 (*)    All other components within normal limits  CBC WITH DIFFERENTIAL/PLATELET - Abnormal; Notable for the following components:   RDW 18.6 (*)    Lymphs Abs 0.4 (*)    Abs Immature Granulocytes 0.08 (*)    All other components within normal limits  LACTIC ACID, PLASMA - Abnormal; Notable for the following components:   Lactic Acid, Venous 3.2 (*)    All other components within normal limits  LACTIC ACID, PLASMA - Abnormal; Notable for the following components:   Lactic Acid, Venous 2.8 (*)    All other components within normal limits  BRAIN NATRIURETIC PEPTIDE - Abnormal; Notable for the following components:   B Natriuretic Peptide 1,048.0 (*)    All other components within normal limits  PROTIME-INR - Abnormal; Notable for the following components:   Prothrombin Time 18.0 (*)    INR 1.5 (*)    All other components within normal limits  RESP PANEL BY RT-PCR (FLU A&B, COVID) ARPGX2  CULTURE, BLOOD (ROUTINE X 2)  CULTURE, BLOOD (ROUTINE X 2)  MRSA NEXT GEN BY PCR, NASAL  URINALYSIS, ROUTINE W REFLEX MICROSCOPIC  TROPONIN I  (HIGH SENSITIVITY)  TROPONIN I (HIGH SENSITIVITY)    Notable for elevated BNP and lactic acid  EKG   EKG Interpretation  Date/Time:  Wednesday December 22 2021 09:23:39 EST Ventricular Rate:  116 PR Interval:  200 QRS Duration: 94 QT Interval:  343 QTC Calculation: 477 R Axis:   112 Text Interpretation: Atrial fibrillation Anterior infarct, old Borderline repolarization abnormality Confirmed by Garnette Gunner [phone]) on 12/22/2021 10:06:14 AM         Imaging Studies ordered: I ordered imaging studies including CXR On my interpretation imaging demonstrates volume overload, possible pNA I independently visualized and interpreted imaging. I agree with the radiologist interpretation   Medicines ordered and prescription drug management: Meds ordered this encounter  Medications   furosemide (LASIX) injection 40 mg   ceFEPIme (MAXIPIME) 2 g in sodium chloride 0.9 % 100 mL IVPB   vancomycin (VANCOREADY) IVPB 1500 mg/300 mL    Order Specific Question:   Indication:    Answer:   Pneumonia    -I have reviewed the patients home medicines and have made adjustments as needed   Consultations Obtained: I requested consultation with the hospitalist,  and discussed lab and imaging findings as well as pertinent plan - they recommend: admission   Cardiac Monitoring: The patient was maintained on a cardiac monitor.  I personally viewed and interpreted the cardiac monitored which showed an underlying rhythm of: NSR  Social Determinants of Health:  Diagnosis or treatment significantly limited by social determinants of health: former smoker   Reevaluation: After the interventions noted above, I reevaluated the patient and found that they have improved  Co morbidities that complicate the patient evaluation  Past Medical History:  Diagnosis Date   Anxiety    Atrial fibrillation ([place])    CAD, ARTERY BYPASS GRAFT 05/19/2008   Qualifier: Diagnosis of  By: Mare Ferrari, RMA, Sherri      Enteritis due to Clostridium difficile, history of 11/21/2012   GERD (gastroesophageal reflux disease)    Hyperlipidemia    Hypertension    Internal carotid artery stenosis    Shingles 06/2020  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 01/14/2014   Vitamin D deficiency       Dispostion: Disposition decision including need for hospitalization was considered, and patient admitted to the hospital.    Final Clinical Impression(s) / ED Diagnoses Final diagnoses:  Acute on chronic systolic congestive heart failure ([place])  HCAP (healthcare-associated pneumonia)     This chart was dictated using voice recognition software.  Despite best efforts to proofread,  errors can occur which can change the documentation meaning.    Cristie Hem, MD 12/22/21 [phone]

## 2021-12-22 NOTE — ED Notes (Signed)
MD notified re: pt being placed on NRB d/t not responding to increase in O2 to 4L [place], pt O2 sats in the low to mid 80s intermittently, RT notified, pt to be placed on BIPAP, family at bedside aware of the plan

## 2021-12-22 NOTE — ED Notes (Signed)
Niece at the bedside reports being his POA, states, "he is a DNR and they have his document on file." Pt reports to have a stent placed this Monday on his L leg, pt seen by Home health for R leg wound

## 2021-12-22 NOTE — Telephone Encounter (Signed)
Alberteen Sam RN with [facility]. Dr.Hochrein's advice left on personal voice mail.

## 2021-12-22 NOTE — Hospital Course (Signed)
88 year old with a history of dementia, persistent atrial fibrillation, coronary artery disease, hypertension, hyperlipidemia, pulmonary hypertension, aortic stenosis, peripheral vascular disease presenting with 2 to 3-day history of progressive generalized weakness and shortness of breath.  At baseline, the patient is normally able to initiate transfer to his wheelchair with which he normally gets around.  However in the last 24 hours he has not been able to go to bed.  He is also had increasing shortness of breath, increasing lower extremity edema and increasing abdominal girth.  Patient himself is a poor historian.  History is supplemented from the patient's niece at the bedside.  He is in the states that he recently had a stent in his left SFA on 12/13/2021.  She states that "he has gone downhill since then".  She also states that she has noted worsening cognitive and functional decline over the last 10 months. There is been no fevers, chills, chest pain, nausea, vomiting, hemoptysis.  He has had some loose stools without hematochezia or melena. In the ED, the patient was afebrile and hemodynamically stable.  He was noted to have oxygen saturation of 75% room air.  He was subsequent placed on BiPAP.  He was given furosemide 40 mg IV x 1 and started on vancomycin and cefepime.  Chest x-ray showed bilateral pleural effusion, right greater than left.  There is left lower lobe infiltrate vs atelectasis.  WBC 7.1, hemoglobin 14.7, platelets 154,000.  BNP was 1048.  Lactate 3.2.  INR 1.5.

## 2021-12-22 NOTE — Progress Notes (Signed)
Noted on pt chart that CGB was 70 approx 1 pm.  Recheck after noting, CGB 62.  Pt not able to consume oral intake at this time, lethargic.  Gave 12.5g D50 with improvement up to 103, pt more responsive.  Will continue to monitor.  Provider notified and updated.

## 2021-12-22 NOTE — ED Triage Notes (Signed)
Pt in from home via [place], per report his wife called out for generalized weakness with worsening today with inability to get out of bed, pt reported to have several falls, with reported sickness for the last several weaks, pt O2 sats were 75% on RA, on NRB 80%, pt on CPAP upon arrival, pt A&O x4, answers questions appropriately

## 2021-12-22 NOTE — ED Notes (Signed)
MD notified re: abnormal lactic acid 3.2

## 2021-12-22 NOTE — ED Notes (Signed)
O2 probe placed at various sites d/t difficulty with getting a reading

## 2021-12-22 NOTE — Progress Notes (Signed)
RT attempted to obtain ABG x2 without any success. MD and RN aware.

## 2021-12-22 NOTE — Progress Notes (Signed)
Multiple attempts made by nursing staff to get a REDS clip reading on patient with result of low quality. Patient remains on the Bipap and is not very responsive other than moaning out at times and when asked to open his eyes, he closed his eyes tighter. Dr Tat made aware

## 2021-12-22 NOTE — ED Notes (Signed)
Rectal temp completed with stool on probe, temp 96.8, warm blankets applied, will reassess temp, socks applied

## 2021-12-22 NOTE — ED Notes (Signed)
RT at bedside.

## 2021-12-22 NOTE — H&P (Signed)
History and Physical    Patient: Danny [name]. VVO:[id number] DOB: Apr 17, 1931 DOA: 12/22/2021 DOS: the patient was seen and examined on 12/22/2021 PCP: Baruch Gouty, FNP  Patient coming from: Home  Chief Complaint: Generalized weakness and shortness of breath  HPI: Danny [name]. is 88 year old with a history of dementia, persistent atrial fibrillation, coronary artery disease, hypertension, hyperlipidemia, pulmonary hypertension, aortic stenosis, peripheral vascular disease presenting with 2 to 3-day history of progressive generalized weakness and shortness of breath.  At baseline, the patient is normally able to initiate transfer to his wheelchair with which he normally gets around.  However in the last 24 hours he has not been able to go to bed.  He is also had increasing shortness of breath, increasing lower extremity edema and increasing abdominal girth.  Patient himself is a poor historian.  History is supplemented from the patient's niece at the bedside.  He is in the states that he recently had a stent in his left SFA on 12/13/2021.  She states that "he has gone downhill since then".  She also states that she has noted worsening cognitive and functional decline over the last 10 months. There is been no fevers, chills, chest pain, nausea, vomiting, hemoptysis.  He has had some loose stools without hematochezia or melena. In the ED, the patient was afebrile and hemodynamically stable.  He was noted to have oxygen saturation of 75% room air.  He was subsequent placed on BiPAP.  He was given furosemide 40 mg IV x 1 and started on vancomycin and cefepime.  Chest x-ray showed bilateral pleural effusion, right greater than left.  There is left lower lobe infiltrate vs atelectasis.  WBC 7.1, hemoglobin 14.7, platelets 154,000.  BNP was 1048.  Lactate 3.2.  INR 1.5.   Review of Systems: As mentioned in the history of present illness. All other systems reviewed and are negative. Past Medical  History:  Diagnosis Date   Anxiety    Atrial fibrillation ([place])    CAD, ARTERY BYPASS GRAFT 05/19/2008   Qualifier: Diagnosis of  By: Mare Ferrari, RMA, Sherri     Enteritis due to Clostridium difficile, history of 11/21/2012   GERD (gastroesophageal reflux disease)    Hyperlipidemia    Hypertension    Internal carotid artery stenosis    Shingles 06/2020   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 01/14/2014   Vitamin D deficiency    Past Surgical History:  Procedure Laterality Date   ABDOMINAL AORTOGRAM W/LOWER EXTREMITY N/A 12/13/2021   Procedure: ABDOMINAL AORTOGRAM W/LOWER EXTREMITY;  Surgeon: Waynetta Sandy, MD;  Location: [facility];  Service: Cardiovascular;  Laterality: N/A;   CARDIOVERSION N/A 05/05/2016   Procedure: CARDIOVERSION;  Surgeon: Josue Hector, MD;  Location: [facility] ENDOSCOPY;  Service: Cardiovascular;  Laterality: N/A;   CAROTID ENDARTERECTOMY Left 2003   CAROTID-SUBCLAVIAN BYPASS GRAFT Right    COLONOSCOPY  2005   hyperplastic polyp x 1 with no adenoma   COLONOSCOPY N/A 04/11/2014   YIR:SWNI diverticulosis in the sigmoid colon/left colon is redundant   CORONARY ARTERY BYPASS GRAFT     ESOPHAGEAL DILATION N/A 04/11/2014   Procedure: ESOPHAGEAL DILATION;  Surgeon: [name], MD;  [facility];  Service: Endoscopy;  Laterality: N/A;   ESOPHAGOGASTRODUODENOSCOPY N/A 04/11/2014   OEV:[id number] at the gastro junction/moderate erosive/duodenal web   EYE SURGERY Bilateral    cataracts   PERIPHERAL VASCULAR INTERVENTION  12/13/2021   Procedure: PERIPHERAL VASCULAR INTERVENTION;  Surgeon: Waynetta Sandy, MD;  Location: [place] [facility];  Service: Cardiovascular;;   RIGHT/LEFT HEART CATH AND CORONARY/GRAFT ANGIOGRAPHY N/A 02/11/2020   Procedure: RIGHT/LEFT HEART CATH AND CORONARY/GRAFT ANGIOGRAPHY;  Surgeon: Martinique, Peter M, MD;  Location: [place] [facility];  Service: Cardiovascular;  Laterality: N/A;   SKIN CANCER EXCISION      Social History:  reports that he quit smoking about 60 years ago. His smoking use included cigarettes. He started smoking about 71 years ago. He has never used smokeless tobacco. He reports that he does not currently use alcohol. He reports that he does not use drugs.  Allergies  Allergen Reactions   Doxycycline Diarrhea   Contrast Media [Iodinated Contrast Media]     Syncope   Lunesta [Eszopiclone]     "felt WILD"   Morphine And Related Nausea And Vomiting   Trazodone And Nefazodone Other (See Comments)    Extremely drowsy the next day after taking.    Family History  Problem Relation Age of Onset   Heart disease Mother    Stroke Mother    Stroke Father    Diabetes Son    Stroke Son    Other Sister        ruptured appendix    Lung cancer Brother        asbestos   Mental illness Sister    Kidney disease Sister    Colon cancer Neg Hx        "not that I know of."    Prior to Admission medications   Medication Sig Start Date End Date Taking? Authorizing Provider  acetaminophen (TYLENOL) 325 MG tablet Take 650 mg by mouth every 6 (six) hours as needed.   Yes [provider]  aspirin 81 MG chewable tablet Chew 81 mg by mouth daily. atrial fibrillation   Yes [provider]  cholecalciferol (VITAMIN D) 25 MCG (1000 UNIT) tablet Take 1,000 Units by mouth daily.   Yes [provider]  furosemide (LASIX) 20 MG tablet TAKE 1 TABLET EVERY DAY 10/18/21  Yes [name] F, FNP  levocetirizine (XYZAL) 5 MG tablet TAKE 1 TABLET EVERY EVENING 11/16/21  Yes Rakes, Connye Burkitt, FNP  metoprolol succinate (TOPROL-XL) 25 MG 24 hr tablet Take 1 tablet (25 mg total) by mouth daily. 10/27/21  Yes Rakes, Connye Burkitt, FNP  omeprazole (PRILOSEC) 20 MG capsule Take 1 capsule (20 mg total) by mouth 2 (two) times daily before a meal. 06/29/21  Yes [name] F, FNP  potassium chloride SA (KLOR-CON M) 20 MEQ tablet Take 2 tablets (40 mEq total) by mouth daily. 07/19/21  Yes [name] F, FNP  metolazone (ZAROXOLYN) 2.5 MG tablet Take 1 tablet (2.5 mg total) by mouth daily for 2 days. Take 30 minutes prior to lasix 11/30/21 12/02/21  Baruch Gouty, FNP    Physical Exam: Vitals:   12/22/21 1100 12/22/21 1104 12/22/21 1105 12/22/21 1106  BP:      Pulse: 60 99 97 (!) 110  Resp: (!) 23 (!) 25 16 (!) 23  SpO2: 98% 96% 96% 90%   GENERAL:  A&O x 3, NAD, well developed, cooperative, follows commands HEENT: [place]/AT, No thrush, No icterus, No oral ulcers Neck:  No neck mass, No meningismus, soft, supple CV: RRR, no S3, no S4, no rub, no JVD Lungs:  CTA, no wheeze, no rhonchi, good air movement Abd: soft/NT +BS, nondistended Ext: No edema, no lymphangitis, no cyanosis, no rashes Neuro:  CN II-XII intact,  strength 4/5 in RUE, RLE, strength 4/5 LUE, LLE; sensation intact bilateral; no dysmetria; babinski equivocal  Data Reviewed: Data reviewed above in the history  Assessment and Plan: Acute respiratory failure with hypoxia and hypercarbia -Secondary to pleural effusion and CHF -Stable on BiPAP presently -Wean off BiPAP for saturation greater 92%  Acute on chronic systolic CHF -Start IV furosemide 40 mg twice daily -Daily weights -Accurate I's and O's -02/06/2021 echo EF 40-45%, severe TR, moderate AS, RV overload  Peripheral vascular disease -Status post left SFA stent 12/13/2021 -Continue aspirin  Persistent atrial fibrillation -no longer on warfarin due to frailty and falls -continue metoprolol succinate at lower dose due to soft BPs  Essential HTN -controlled  Aortic Stenosis -he is frail and not a candidate for intervention.   Major neurocognitive disorder -at risk for hospital delirium -not on therapy outpatient  Coronary Artery Disease -not a candidate for aggressive intervention -1/18.23 cath---Left main ostial occlusion; Nonobstructive disease in the RCA; patent grafts  -no chest pain presently -continue ASA    Advance Care Planning:  DNR--confirmed with niece  Consults: none  Family Communication: niece at bedside 11/29  Severity of Illness: The appropriate patient status for this patient is INPATIENT. Inpatient status is judged to be reasonable and necessary in order to provide the required intensity of service to ensure the patient's safety. The patient's presenting symptoms, physical exam findings, and initial radiographic and laboratory data in the context of their chronic comorbidities is felt to place them at high risk for further clinical deterioration. Furthermore, it is not anticipated that the patient will be medically stable for discharge from the hospital within 2 midnights of admission.   * I certify that at the point of admission it is my clinical judgment that the patient will require inpatient hospital care spanning beyond 2 midnights from the point of admission due to high intensity of service, high risk for further deterioration and high frequency of surveillance required.*  Author: Orson Eva, MD 12/22/2021 12:47 PM  For on call review [email or web address].

## 2021-12-22 NOTE — Progress Notes (Signed)
*  PRELIMINARY RESULTS* Echocardiogram 2D Echocardiogram has been performed with Definity.  Danny [name] 12/22/2021, 5:06 PM

## 2021-12-23 ENCOUNTER — Inpatient Hospital Stay (HOSPITAL_COMMUNITY): Payer: Medicare HMO

## 2021-12-23 ENCOUNTER — Other Ambulatory Visit: Payer: Self-pay

## 2021-12-23 DIAGNOSIS — Z7189 Other specified counseling: Secondary | ICD-10-CM

## 2021-12-23 DIAGNOSIS — F0390 Unspecified dementia without behavioral disturbance: Secondary | ICD-10-CM | POA: Diagnosis not present

## 2021-12-23 DIAGNOSIS — I25810 Atherosclerosis of coronary artery bypass graft(s) without angina pectoris: Secondary | ICD-10-CM

## 2021-12-23 DIAGNOSIS — I739 Peripheral vascular disease, unspecified: Secondary | ICD-10-CM

## 2021-12-23 DIAGNOSIS — I350 Nonrheumatic aortic (valve) stenosis: Secondary | ICD-10-CM

## 2021-12-23 DIAGNOSIS — Z8679 Personal history of other diseases of the circulatory system: Secondary | ICD-10-CM | POA: Diagnosis not present

## 2021-12-23 DIAGNOSIS — L03115 Cellulitis of right lower limb: Secondary | ICD-10-CM | POA: Diagnosis not present

## 2021-12-23 DIAGNOSIS — I4819 Other persistent atrial fibrillation: Secondary | ICD-10-CM | POA: Diagnosis not present

## 2021-12-23 DIAGNOSIS — I5023 Acute on chronic systolic (congestive) heart failure: Secondary | ICD-10-CM | POA: Diagnosis not present

## 2021-12-23 DIAGNOSIS — L02415 Cutaneous abscess of right lower limb: Secondary | ICD-10-CM

## 2021-12-23 DIAGNOSIS — J9601 Acute respiratory failure with hypoxia: Secondary | ICD-10-CM | POA: Diagnosis not present

## 2021-12-23 LAB — CBC
HCT: 36.6 % — ABNORMAL LOW (ref 39.0–52.0)
Hemoglobin: 12.1 g/dL — ABNORMAL LOW (ref 13.0–17.0)
MCH: 31.5 pg (ref 26.0–34.0)
MCHC: 33.1 g/dL (ref 30.0–36.0)
MCV: 95.3 fL (ref 80.0–100.0)
Platelets: 200 10*3/uL (ref 150–400)
RBC: 3.84 MIL/uL — ABNORMAL LOW (ref 4.22–5.81)
RDW: 17.1 % — ABNORMAL HIGH (ref 11.5–15.5)
WBC: 9.2 10*3/uL (ref 4.0–10.5)
nRBC: 0 % (ref 0.0–0.2)

## 2021-12-23 LAB — BLOOD GAS, ARTERIAL
Acid-Base Excess: 9.4 mmol/L — ABNORMAL HIGH (ref 0.0–2.0)
Bicarbonate: 35.3 mmol/L — ABNORMAL HIGH (ref 20.0–28.0)
Drawn by: 38235
FIO2: 50 %
O2 Saturation: 100 %
Patient temperature: 37
pCO2 arterial: 52 mmHg — ABNORMAL HIGH (ref 32–48)
pH, Arterial: 7.44 (ref 7.35–7.45)
pO2, Arterial: 172 mmHg — ABNORMAL HIGH (ref 83–108)

## 2021-12-23 LAB — BASIC METABOLIC PANEL
Anion gap: 10 (ref 5–15)
BUN: 37 mg/dL — ABNORMAL HIGH (ref 8–23)
CO2: 27 mmol/L (ref 22–32)
Calcium: 8.2 mg/dL — ABNORMAL LOW (ref 8.9–10.3)
Chloride: 100 mmol/L (ref 98–111)
Creatinine, Ser: 1.2 mg/dL (ref 0.61–1.24)
GFR, Estimated: 58 mL/min — ABNORMAL LOW (ref >60)
Glucose, Bld: 85 mg/dL (ref 70–99)
Potassium: 4.1 mmol/L (ref 3.5–5.1)
Sodium: 137 mmol/L (ref 135–145)

## 2021-12-23 LAB — VITAMIN B12: Vitamin B-12: 852 pg/mL (ref 180–914)

## 2021-12-23 LAB — GLUCOSE, CAPILLARY
Glucose-Capillary: 73 mg/dL (ref 70–99)
Glucose-Capillary: 82 mg/dL (ref 70–99)

## 2021-12-23 LAB — FOLATE: Folate: 19.2 ng/mL (ref >5.9)

## 2021-12-23 LAB — AMMONIA: Ammonia: 34 umol/L (ref 9–35)

## 2021-12-23 LAB — MAGNESIUM: Magnesium: 2.2 mg/dL (ref 1.7–2.4)

## 2021-12-23 MED ORDER — CEFAZOLIN SODIUM-DEXTROSE 1-4 GM/50ML-% IV SOLN
1.0000 g | Freq: Three times a day (TID) | INTRAVENOUS | Status: DC
  Administered 2021-12-23 – 2021-12-26 (×10): 1 g via INTRAVENOUS
  Filled 2021-12-23 (×13): qty 50

## 2021-12-23 MED ORDER — KETOROLAC TROMETHAMINE 15 MG/ML IJ SOLN
15.0000 mg | Freq: Once | INTRAMUSCULAR | Status: AC | PRN
  Administered 2021-12-23: 15 mg via INTRAVENOUS
  Filled 2021-12-23: qty 1

## 2021-12-23 MED ORDER — ALBUMIN HUMAN 25 % IV SOLN
25.0000 g | Freq: Once | INTRAVENOUS | Status: AC
  Administered 2021-12-23: 25 g via INTRAVENOUS
  Filled 2021-12-23: qty 100

## 2021-12-23 NOTE — Consult Note (Addendum)
Consultation Note Date: 12/23/2021   Patient Name: Danny [name].  DOB: 05-19-1931  MRN: [id number]  Age / Sex: 88 y.o., male  PCP: Rakes, Connye Burkitt, FNP Referring Physician: Orson Eva, MD  Reason for Consultation:  goals of care  HPI/Patient Profile: 88 y.o. male  with past medical history of dementia, CAD s/p CABG 2003, a-fib not on anticoag d/t falls, PAD s/p recent stent placement d/t critical limb ischemia, HTN, HLD  admitted on 12/22/2021 with increasing SOB, weakness. Workup revealed acute on chronic heart failure exacerbation- echo this admission showing aortic valve stenosis and worsening EF at 30-35%. Not a candidate for further therapeutic interventions. He is diuresing. Required intermittent bipap- off today. Hypotension this morning- pressor decision pending [place] discussion. Palliative consulted for goals of care.    Primary Decision Maker NEXT OF KIN- spouse Danny [name] with help of niece Danny [name]  Discussion: Chart reviewed including labs, progress notes, imaging from this and previous encounters.  On evaluation patient is awake and alert but he is disoriented.  He has eaten about 30% of his lunch.  He requested some ice cream and did finish his orange sherbet.  He is unable to participate in goals of care discussion. I met with his niece Danny [name] is his healthcare power of attorney however she includes his spouse Danny [name] and decision making. Danny [name] and Danny [name] have been married for 29 years.  They met a chance encounter of Danny [name] giving Danny [name] a ride.  He then had to go to [place] in the [employer] for a year and a half.  They remarried when he returned. Danny [name] has always valued being very strong and being a caretaker.  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  He is known for having a very kind heart. He has had ongoing debility in this last year.  He  is now wheelchair and bedbound.  He is incontinent of stool and urine.  This has been very difficult for him and its been difficult for Danny [name] to see him this way. We discussed his worsening heart failure. Danny [name] and Danny [name] understand there are no advanced therapeutics that can fix his heart failure.  We discussed that symptoms can be managed but that his heart will continue to weaken and he is likely facing end-of-life. Danny [name] and Danny [name] both agree that they wish to focus on his quality of life rather than longevity.  The goal is for Danny [name] and Danny [name] to be at home together for as long as possible.  They have been in contact with a Education officer, museum and have been trying to get supplemental care at home that they will privately pay for. We also discussed option for hospice support at home.  They would like to speak with hospice.  Hospice philosophy and services were reviewed. As of care were determined to be to optimize him as much as possible while he is here in the hospital and then to discharge him home with hospice care. However, if his  condition worsens in the hospital then plan would be to transition him to comfort care only in the hospital.  We discussed the use of advanced medicines like IV vasopressors.  He would be at high risk of worsening limb ischemia given his history of peripheral vascular disease and critical limb ischemia.  I would not recommend the use of vasopressors.  They agreed.  SUMMARY OF RECOMMENDATIONS -Patient has DNR in place -Continue current care -Condition worsens do not escalate transition to comfort measures only -Avoid IV vasopressors -Goals of care are to optimize medically with limited interventions with plans to discharge home with hospice - On discharge, would recommend scripts for: - Morphine Concentrate 58m/0.5ml: 552m(0.2555msublingual every 1 hour as needed for pain or shortness of breath: Disp 48m60mLorazepam 2mg/74mconcentrated solution: 1mg (76mml) s15mingual  every 4 hours as needed for anxiety: Disp 48ml - 16mol 2mg/ml s60mtion: 0.5mg (0.2563m subl37mal every 4 hours as needed for agitation or nausea: Disp 48ml    Cod56matus/Advance Care Planning: DNR   Prognosis:   < 6 months  Discharge Planning: Home with Hospice  Primary Diagnoses: Present on Admission:  Acute on chronic systolic CHF (congestive heart failure) (HCC)  Major neurocognitive disorder (HCC)  Essent[place]perry hypertension  Persistent atrial fibrillation (HCC)   [employer]  Unable to perform ROS: Dementia    Physical Exam Vitals and nursing note reviewed.  Constitutional:      Comments: Frail  Cardiovascular:     Rate and Rhythm: Normal rate.  Pulmonary:     Effort: Pulmonary effort is normal.  Neurological:     Mental Status: He is disoriented.     Comments: Alert     Vital Signs: BP 111/77   Pulse 88   Temp 98.3 F (36.8 C) (Axillary)   Resp 13   Ht _0  (1.88 m)   Wt 75.5 kg   SpO2 100%   BMI 21.37 kg/m  Pain Scale: 0-10 POSS *See Group Information*: 2-Acceptable,Slightly drowsy, easily aroused Pain Score: 0-No pain   SpO2: SpO2: 100 % O2 Device:SpO2: 100 % O2 Flow Rate: .O2 Flow Rate (L/min): 4 L/min  IO: Intake/output summary:  Intake/Output Summary (Last 24 hours) at 12/23/2021 1305 Last data filed at 12/23/2021 1100 Gross per 24 hour  Intake 562.32 ml  Output 3600 ml  Net -3037.68 ml    LBM:   Baseline Weight: Weight: 79.3 kg Most recent weight: Weight: 75.5 kg       Thank you for this consult. Palliative medicine will continue to follow and assist as needed.  Time Total: 105 minutes Greater than 50%  of this time was spent counseling and coordinating care related to the above assessment and plan.  Signed by: Eilleen Davoli,[facility]    Please contact Palliative Medicine Team phone at [phone] [phone] and concerns.  For individual provider: See AmionShea Evans

## 2021-12-23 NOTE — Evaluation (Signed)
Clinical/Bedside Swallow Evaluation Patient Details  Name: Danny [name]. 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 01/14/2014   Vitamin D deficiency    Past Surgical History:  Past Surgical History:  Procedure Laterality Date   ABDOMINAL AORTOGRAM W/LOWER EXTREMITY N/A 12/13/2021   Procedure: ABDOMINAL AORTOGRAM W/LOWER EXTREMITY;  Surgeon: Waynetta Sandy, MD;  Location: [place] [facility];  Service: Cardiovascular;  Laterality: N/A;   CARDIOVERSION N/A 05/05/2016   Procedure: CARDIOVERSION;  Surgeon: Josue Hector, MD;  Location: [facility] ENDOSCOPY;  Service: Cardiovascular;  Laterality: N/A;   CAROTID ENDARTERECTOMY Left 2003   CAROTID-SUBCLAVIAN BYPASS GRAFT Right    COLONOSCOPY  2005   hyperplastic polyp x 1 with no adenoma   COLONOSCOPY N/A 04/11/2014   HER:DEYC diverticulosis in the sigmoid colon/left colon is redundant   CORONARY ARTERY BYPASS GRAFT     ESOPHAGEAL DILATION N/A 04/11/2014   Procedure: ESOPHAGEAL DILATION;  Surgeon: [name], MD;  [facility];  Service: Endoscopy;  Laterality: N/A;   ESOPHAGOGASTRODUODENOSCOPY N/A 04/11/2014   XKG:[id number] at the gastro junction/moderate erosive/duodenal web   EYE SURGERY Bilateral    cataracts   PERIPHERAL VASCULAR INTERVENTION  12/13/2021   Procedure: PERIPHERAL VASCULAR INTERVENTION;  Surgeon: Waynetta Sandy, MD;  Location: [place] [facility];  Service: Cardiovascular;;   RIGHT/LEFT HEART CATH AND CORONARY/GRAFT ANGIOGRAPHY N/A 02/11/2020   Procedure: RIGHT/LEFT HEART CATH AND CORONARY/GRAFT ANGIOGRAPHY;  Surgeon: Martinique, Peter M, MD;  Location: [place] [facility];  Service: Cardiovascular;  Laterality: N/A;   SKIN CANCER EXCISION     HPI:  88 y.o. male  with past medical history of dementia, CAD s/p CABG 2003, a-fib not on anticoag d/t falls, PAD s/p recent stent placement d/t critical limb ischemia, HTN, HLD  admitted on 12/22/2021 with increasing SOB, weakness. Workup revealed acute on chronic heart failure exacerbation- echo this admission showing aortic valve stenosis and worsening EF at 30-35%. Not a candidate for further therapeutic interventions. He is diuresing. Required intermittent bipap- off today. Hypotension this morning- pressor decision pending [place] discussion. BSE requested.    Assessment / Plan / Recommendation  Clinical Impression  Pt seen at bedside for a clinical swallow evaluation with wife and niece present. Pt is no longer on Bi-Pap and is more alert and talkative today, per family. Pt's wife reports that Pt sometimes drinks water and then it "comes back up", but is able to take his potassium whole with water. Oral motor examination reveals xerostomia, missing dentition (has partial at home), and mild breathy/hoarse vocal quality. Pt consumed thin water via cup/straw, puree, and peanut butter crackers with a delayed cough after the crackers. Pt given NTL to try and he stated they were "tasty". Family would like Pt to be able to eat what he prefers. Recommend regular textures (someone will need to cut  up foods on his tray for him) and thin liquids via cup/straw sip ok and PO medications whole with water or in puree prn. Mrs. Douty reports that Pt often drinks water from a bottle, which may cause him to tip his head back, so they were encouraged to try drinking from a cup/straw  with head in neutral to down position at home. SLP will follow x1/PRN during acute stay to facilitate diet tolerance and ongoing Pt/family education. SLP Visit Diagnosis: Dysphagia, unspecified (R13.10)    Aspiration Risk  Mild aspiration risk    Diet Recommendation Regular;Thin liquid   Liquid Administration via: Cup;Straw Medication Administration: Whole meds with liquid Supervision: Staff to assist with self feeding;Full supervision/cueing for compensatory strategies Compensations: Slow rate;Small sips/bites Postural Changes: Seated upright at 90 degrees;Remain upright for at least 30 minutes after po intake    Other  Recommendations Oral Care Recommendations: Oral care BID;Staff/trained caregiver to provide oral care Other Recommendations: Clarify dietary restrictions    Recommendations for follow up therapy are one component of a multi-disciplinary discharge planning process, led by the attending physician.  Recommendations may be updated based on patient status, additional functional criteria and insurance authorization.  Follow up Recommendations No SLP follow up      Assistance Recommended at Discharge    Functional Status Assessment Patient has had a recent decline in their functional status and demonstrates the ability to make significant improvements in function in a reasonable and predictable amount of time.  Frequency and Duration min 1 x/week  1 week       Prognosis Prognosis for Safe Diet Advancement: Good Barriers to Reach Goals: Cognitive deficits      Swallow Study   General Date of Onset: 12/22/21 HPI: 88 y.o. male  with past medical history of dementia, CAD s/p CABG 2003, a-fib not on anticoag d/t falls, PAD s/p recent stent placement d/t critical limb ischemia, HTN, HLD  admitted on 12/22/2021 with increasing SOB, weakness. Workup revealed acute on chronic heart failure exacerbation- echo this admission showing aortic valve stenosis and worsening EF at  30-35%. Not a candidate for further therapeutic interventions. He is diuresing. Required intermittent bipap- off today. Hypotension this morning- pressor decision pending [place] discussion. BSE requested. Type of Study: Bedside Swallow Evaluation Previous Swallow Assessment: N/A Diet Prior to this Study: Dysphagia 2 (chopped);Thin liquids Temperature Spikes Noted: No Respiratory Status: Room air History of Recent Intubation: No Behavior/Cognition: Alert;Cooperative;Pleasant mood Oral Cavity Assessment: Within Functional Limits Oral Care Completed by SLP: Yes Oral Cavity - Dentition: Missing dentition (some dentition, his partial is at home) Vision: Functional for self-feeding Self-Feeding Abilities: Able to feed self;Needs set up Patient Positioning: Upright in bed Baseline Vocal Quality: Normal;Breathy;Low vocal intensity Volitional Cough: Strong Volitional Swallow: Able to elicit    Oral/Motor/Sensory Function Overall Oral Motor/Sensory Function: Within functional limits   Ice Chips Ice chips: Within functional limits Presentation: Spoon   Thin Liquid Thin Liquid: Within functional limits Presentation: Cup;Self Fed;Straw    Nectar Thick Nectar Thick Liquid: Within functional limits Presentation: Straw   Honey Thick Honey Thick Liquid: Not tested   Puree Puree: Within functional limits Presentation: Spoon   Solid     Solid: Impaired Presentation: Self Fed Pharyngeal Phase Impairments: Cough - Delayed     Thank you,  Genene Churn, [place]  Kashlynn Kundert 12/23/2021,4:01 PM

## 2021-12-23 NOTE — Progress Notes (Signed)
Patient taken off of BIPAP and placed on 4L nasal cannula. Patient tolerating well at this time. BIPAP is in room if and when needed.

## 2021-12-23 NOTE — Progress Notes (Signed)
PROGRESS NOTE  Danny [name]. WGN:[id number] DOB: 1931-02-24 DOA: 12/22/2021 PCP: Baruch Gouty, FNP  Brief History:  88 year old with a history of dementia, persistent atrial fibrillation, coronary artery disease, hypertension, hyperlipidemia, pulmonary hypertension, aortic stenosis, peripheral vascular disease presenting with 2 to 3-day history of progressive generalized weakness and shortness of breath.  At baseline, the patient is normally able to initiate transfer to his wheelchair with which he normally gets around.  However in the last 24 hours he has not been able to go to bed.  He is also had increasing shortness of breath, increasing lower extremity edema and increasing abdominal girth.  Patient himself is a poor historian.  History is supplemented from the patient's niece at the bedside.  He is in the states that he recently had a stent in his left SFA on 12/13/2021.  She states that "he has gone downhill since then".  She also states that she has noted worsening cognitive and functional decline over the last 10 months. There is been no fevers, chills, chest pain, nausea, vomiting, hemoptysis.  He has had some loose stools without hematochezia or melena. In the ED, the patient was afebrile and hemodynamically stable.  He was noted to have oxygen saturation of 75% room air.  He was subsequent placed on BiPAP.  He was given furosemide 40 mg IV x 1 and started on vancomycin and cefepime.  Chest x-ray showed bilateral pleural effusion, right greater than left.  There is left lower lobe infiltrate vs atelectasis.  WBC 7.1, hemoglobin 14.7, platelets 154,000.  BNP was 1048.  Lactate 3.2.  INR 1.5.   Assessment/Plan:  Acute respiratory failure with hypoxia and hypercarbia -Secondary to pleural effusion and CHF -Initially on BiPAP -now stable on 2L   Acute on chronic systolic CHF -Biventricular failure -Continue IV furosemide 40 mg twice daily -Daily weights -Accurate I's and  O's--Neg 2.9L -02/06/2021 echo EF 40-45%, severe TR, moderate AS, RV overload -12/12/21 Echo EF 30-35%, severe RV dysfxn, severe TR -appreciate cardiology   Peripheral vascular disease -Status post left SFA stent 12/13/2021 -Continue aspirin  Cellulitis right leg -start cefazolin   Persistent atrial fibrillation -no longer on warfarin due to frailty and falls -continue metoprolol succinate at lower dose due to soft BPs   Essential HTN -BPs soft with diuresis   Severe Aortic Stenosis -he is frail and not a candidate for intervention.    Major neurocognitive disorder -at risk for hospital delirium -not on therapy outpatient   Coronary Artery Disease -not a candidate for aggressive intervention -1/18.23 cath---Left main ostial occlusion; Nonobstructive disease in the RCA; patent grafts  -no chest pain presently -continue ASA  GOC -appreciate palliative consult -plan to optimize as much as possible and d/c home with hospice care        Family Communication:   spouse and niece at bedside 11/30  Consultants:  cardiology/palliative  Code Status:  DNR  DVT Prophylaxis:  [place] Heparin    Procedures: As Listed in Progress Note Above  Antibiotics: None       Subjective: Patient denies fevers, chills, headache, chest pain, dyspnea, nausea, vomiting, diarrhea, abdominal pain, dysuria, hematuria, hematochezia, and melena.   Objective: Vitals:   12/23/21 1000 12/23/21 1104 12/23/21 1200 12/23/21 1300  BP: 109/70 (!) 87/57 111/77 106/60  Pulse: 93 96 88 99  Resp: (!) '24 20 13 20  '$ Temp:      TempSrc:  SpO2: 100% 97% 100% 98%  Weight:      Height:        Intake/Output Summary (Last 24 hours) at 12/23/2021 1414 Last data filed at 12/23/2021 1300 Gross per 24 hour  Intake 802.32 ml  Output 3600 ml  Net -2797.68 ml   Weight change:  Exam:  General:  Pt is alert, follows commands appropriately, not in acute distress HEENT: No icterus, No thrush, No  neck mass, [place]/AT Cardiovascular: RRR, S1/S2, no rubs, no gallops Respiratory: CTA bilaterally, no wheezing, no crackles, no rhonchi Abdomen: Soft/+BS, non tender, non distended, no guarding Extremities: 1 +LE edema, No lymphangitis, No petechiae, No rashes, no synovitis;  + erythema RLE below knee to ankle   Data Reviewed: I have personally reviewed following labs and imaging studies Basic Metabolic Panel: Recent Labs  Lab 12/22/21 1259 12/22/21 1302 12/23/21 0600  NA 137 137 137  K 5.3 5.3* 4.1  CL 102 100 100  CO2 '21 26 27  '$ GLUCOSE 80 86 85  BUN 34 36* 37*  CREATININE 0.97 1.09 1.20  CALCIUM 8.2 8.5* 8.2*  MG  --   --  2.2   Liver Function Tests: Recent Labs  Lab 12/22/21 1302  AST 29  ALT 26  ALKPHOS 130*  BILITOT 2.1*  PROT 6.1*  ALBUMIN 2.7*   No results for input(s): "LIPASE", "AMYLASE" in the last 168 hours. Recent Labs  Lab 12/23/21 0600  AMMONIA 34   Coagulation Profile: Recent Labs  Lab 12/22/21 0920  INR 1.5*   CBC: Recent Labs  Lab 12/22/21 0920 12/23/21 0600  WBC 7.1 9.2  NEUTROABS 5.7  --   HGB 14.7 12.1*  HCT 44.1 36.6*  MCV 96.3 95.3  PLT 154 200   Cardiac Enzymes: No results for input(s): "CKTOTAL", "CKMB", "CKMBINDEX", "TROPONINI" in the last 168 hours. BNP: Invalid input(s): "POCBNP" CBG: Recent Labs  Lab 12/22/21 1929 12/22/21 1957 12/22/21 2313 12/23/21 0305 12/23/21 0613  GLUCAP 62* 103* 84 73 82   HbA1C: No results for input(s): "HGBA1C" in the last 72 hours. Urine analysis:    Component Value Date/Time   COLORURINE YELLOW 12/22/2021 1241   APPEARANCEUR CLEAR 12/22/2021 1241   APPEARANCEUR Clear 05/05/2017 1346   LABSPEC 1.009 12/22/2021 1241   PHURINE 5.0 12/22/2021 1241   GLUCOSEU NEGATIVE 12/22/2021 1241   HGBUR NEGATIVE 12/22/2021 1241   BILIRUBINUR NEGATIVE 12/22/2021 1241   BILIRUBINUR Negative 05/05/2017 1346   KETONESUR NEGATIVE 12/22/2021 1241   PROTEINUR NEGATIVE 12/22/2021 1241   UROBILINOGEN  negative 02/11/2015 1142   UROBILINOGEN 0.2 09/19/2012 2034   NITRITE NEGATIVE 12/22/2021 1241   LEUKOCYTESUR NEGATIVE 12/22/2021 1241   Sepsis Labs: '@LABRCNTIP'$ (procalcitonin:4,lacticidven:4) ) Recent Results (from the past 240 hour(s))  Resp Panel by RT-PCR (Flu A&B, Covid) Anterior Nasal Swab     Status: None   Collection Time: 12/22/21  9:21 AM   Specimen: Anterior Nasal Swab  Result Value Ref Range Status   SARS Coronavirus 2 by RT PCR NEGATIVE NEGATIVE Final    Comment: (NOTE) SARS-CoV-2 target nucleic acids are NOT DETECTED.  The SARS-CoV-2 RNA is generally detectable in upper respiratory specimens during the acute phase of infection. The lowest concentration of SARS-CoV-2 viral copies this assay can detect is 138 copies/mL. A negative result does not preclude SARS-Cov-2 infection and should not be used as the sole basis for treatment or other patient management decisions. A negative result may occur with  improper specimen collection/handling, submission of specimen other than nasopharyngeal swab, presence  of viral mutation(s) within the areas targeted by this assay, and inadequate number of viral copies(<138 copies/mL). A negative result must be combined with clinical observations, patient history, and epidemiological information. The expected result is Negative.  Fact Sheet for Patients:  EntrepreneurPulse.com.au  Fact Sheet for Healthcare Providers:  IncredibleEmployment.be  This test is no t yet approved or cleared by the [place] FDA and  has been authorized for detection and/or diagnosis of SARS-CoV-2 by FDA under an Emergency Use Authorization (EUA). This EUA will remain  in effect (meaning this test can be used) for the duration of the COVID-19 declaration under Section 564(b)(1) of the Act, 21 U.S.C.section 360bbb-3(b)(1), unless the authorization is terminated  or revoked sooner.       Influenza A by PCR NEGATIVE  NEGATIVE Final   Influenza B by PCR NEGATIVE NEGATIVE Final    Comment: (NOTE) The Xpert Xpress SARS-CoV-2/FLU/RSV plus assay is intended as an aid in the diagnosis of influenza from Nasopharyngeal swab specimens and should not be used as a sole basis for treatment. Nasal washings and aspirates are unacceptable for Xpert Xpress SARS-CoV-2/FLU/RSV testing.  Fact Sheet for Patients: EntrepreneurPulse.com.au  Fact Sheet for Healthcare Providers: IncredibleEmployment.be  This test is not yet approved or cleared by the [place] FDA and has been authorized for detection and/or diagnosis of SARS-CoV-2 by FDA under an Emergency Use Authorization (EUA). This EUA will remain in effect (meaning this test can be used) for the duration of the COVID-19 declaration under Section 564(b)(1) of the Act, 21 U.S.C. section 360bbb-3(b)(1), unless the authorization is terminated or revoked.  Performed at [facility], [address] [place], [place] [address]   Culture, blood (routine x 2)     Status: None (Preliminary result)   Collection Time: 12/22/21 10:39 AM   Specimen: BLOOD  Result Value Ref Range Status   Specimen Description BLOOD BLOOD RIGHT HAND  Final   Special Requests NONE  Final   Culture   Final    NO GROWTH < 12 HOURS Performed at [facility], [address] [place], [place] [address]    Report Status PENDING  Incomplete  Culture, blood (routine x 2)     Status: None (Preliminary result)   Collection Time: 12/22/21 10:39 AM   Specimen: BLOOD  Result Value Ref Range Status   Specimen Description BLOOD BLOOD LEFT HAND  Final   Special Requests NONE  Final   Culture   Final    NO GROWTH < 12 HOURS Performed at [facility], [address], [place], [place] [address]    Report Status PENDING  Incomplete  MRSA Next Gen by PCR, Nasal     Status: None   Collection Time: 12/22/21 12:53 PM   Specimen: Nasal Mucosa; Nasal Swab  Result Value  Ref Range Status   MRSA by PCR Next Gen NOT DETECTED NOT DETECTED Final    Comment: (NOTE) The GeneXpert MRSA Assay (FDA approved for NASAL specimens only), is one component of a comprehensive MRSA colonization surveillance program. It is not intended to diagnose MRSA infection nor to guide or monitor treatment for MRSA infections. Test performance is not FDA approved in patients less than 27 years old. Performed at [facility], [address], [place], [place] [address]      Scheduled Meds:  aspirin  81 mg Oral Daily   Chlorhexidine Gluconate Cloth  6 each Topical Q0600   furosemide  40 mg Intravenous BID   heparin  5,000 Units Subcutaneous  Q8H   mouth rinse  15 mL Mouth Rinse 4 times per day   sodium chloride flush  3 mL Intravenous Q12H   Continuous Infusions:  sodium chloride      Procedures/Studies: DG CHEST PORT 1 VIEW  Result Date: 12/23/2021 CLINICAL DATA:  [id number] with bilateral pleural effusions and systolic CHF, respiratory distress. EXAM: PORTABLE CHEST 1 VIEW COMPARISON:  Portable chest yesterday at 9:44 a.m. FINDINGS: 4:51 a.m. CABG changes, cardiomegaly and a heavily calcified thoracic aorta are again noted. There is central vascular prominence which appears similar, large right and moderate left pleural effusions and either a mild improvement in the right effusion or redistribution. Overlying atelectasis or airspace disease on the right-greater-than-left appear similar on the left but improved on the right, with the right lower lung field still obscured by the fluid. The lung fields are otherwise clear. In all other respects there are no further changes. IMPRESSION: 1. Mild improvement in the right pleural effusion versus fluid redistribution. 2. Cardiomegaly with central vascular congestion and right-greater-than-left pleural effusions with overlying atelectasis or airspace disease. 3. Aortic atherosclerosis. Electronically Signed   By: Telford Nab M.D.   On: 12/23/2021  06:09   ECHOCARDIOGRAM COMPLETE  Result Date: 12/22/2021    ECHOCARDIOGRAM REPORT   Patient Name:   Zeev Deakins. Date of Exam: 12/22/2021 Medical Rec #:  [id number]         Height:       74.0 in Accession #:    [id number]        Weight:       174.8 lb Date of Birth:  Sep 21, 1931         BSA:          2.053 m Patient Age:    33 years          BP:           122/79 mmHg Patient Gender: M                 HR:           84 bpm. Exam Location:  [facility] Procedure: 2D Echo, Cardiac Doppler and Color Doppler Indications:    CHF-Acute Systolic I33.82  History:        Patient has prior history of Echocardiogram examinations, most                 recent 02/06/2021. CAD, Prior CABG, Arrythmias:Atrial                 Fibrillation; Risk Factors:Hypertension, Dyslipidemia and Former                 Smoker.  Sonographer:    Alvino Chapel RCS Referring Phys: [phone] Alayzha An IMPRESSIONS  1. Left ventricular ejection fraction, by estimation, is 30 to 35%. The left ventricle has moderately decreased function. The left ventricle has no regional wall motion abnormalities. Left ventricular diastolic function could not be evaluated. There is the interventricular septum is flattened in systole and diastole, consistent with right ventricular pressure and volume overload. RV volume overload causing low LV filling output.  2. Severe right ventricular enlargement. Severe right ventricular systolic dysfunction.  3. Left atrial size was severely dilated.  4. Right atrial size was severely dilated.  5. The mitral valve is abnormal. Trivial mitral valve regurgitation. No evidence of mitral stenosis.  6. Tricuspid valve regurgitation is severe. Unable to calculate pulmonary artery systolic pressure due to rapid pressure equilibration between RV and  RA from severe TR.  7. The aortic valve is abnormal. There is severe calcifcation of the aortic valve. Aortic valve regurgitation is not visualized. There is low flow-low gradient severe aortic  valve stenosis with V max 3.12 m/s, mean PG 24 mm Hg, Ao valve area by continuity equation 0.35 cm2 and SVI 12.7 ml/m2. Cardiology consult is recommended. Comparison(s): Changes from prior study are noted. LVEF decreased to 30-35%, RV function severely reduced and low flow-low gradient severe aortic valve stenosis now. FINDINGS  Left Ventricle: Left ventricular ejection fraction, by estimation, is 30 to 35%. The left ventricle has moderately decreased function. The left ventricle has no regional wall motion abnormalities. Definity contrast agent was given IV to delineate the left ventricular endocardial borders. The left ventricular internal cavity size was normal in size. There is no left ventricular hypertrophy. The interventricular septum is flattened in systole and diastole, consistent with right ventricular pressure and  volume overload. Left ventricular diastolic function could not be evaluated due to nondiagnostic images. Left ventricular diastolic function could not be evaluated. Right Ventricle: Unable to calculate PASP due to rapid pressure equilibration between RV and RA due to severe TR. The right ventricular size is severely enlarged. Right vetricular wall thickness was not well visualized. Right ventricular systolic function is severely reduced. Left Atrium: Left atrial size was severely dilated. Right Atrium: Right atrial size was severely dilated. Pericardium: There is no evidence of pericardial effusion. Mitral Valve: The mitral valve is abnormal. There is moderate calcification of the mitral valve leaflet(s). Trivial mitral valve regurgitation. No evidence of mitral valve stenosis. Tricuspid Valve: The tricuspid valve is abnormal. Tricuspid valve regurgitation is severe. No evidence of tricuspid stenosis. Aortic Valve: The aortic valve is abnormal. There is severe calcifcation of the aortic valve. There is moderate aortic valve annular calcification. Aortic valve regurgitation is not visualized.  Severe aortic stenosis is present. Aortic valve mean gradient measures 23.0 mmHg. Aortic valve peak gradient measures 36.8 mmHg. Aortic valve area, by VTI measures 0.35 cm. Pulmonic Valve: The pulmonic valve was not well visualized. Pulmonic valve regurgitation is not visualized. No evidence of pulmonic stenosis. Aorta: The aortic root is normal in size and structure. Venous: IVC assessment for right atrial pressure unable to be performed due to mechanical ventilation. IAS/Shunts: No atrial level shunt detected by color flow Doppler.  LEFT VENTRICLE PLAX 2D LVIDd:         3.70 cm   Diastology LVIDs:         3.10 cm   LV e' medial:    5.44 cm/s LV PW:         1.20 cm   LV E/e' medial:  24.8 LV IVS:        1.00 cm   LV e' lateral:   7.07 cm/s LVOT diam:     1.90 cm   LV E/e' lateral: 19.1 LV SV:         26 LV SV Index:   13 LVOT Area:     2.84 cm  RIGHT VENTRICLE RV S prime:     6.90 cm/s TAPSE (M-mode): 1.3 cm LEFT ATRIUM              Index        RIGHT ATRIUM           Index LA diam:        4.70 cm  2.29 cm/m   RA Area:     40.70 cm LA Vol (A2C):  91.3 ml  44.48 ml/m  RA Volume:   197.00 ml 95.97 ml/m LA Vol (A4C):   133.0 ml 64.79 ml/m LA Biplane Vol: 113.0 ml 55.05 ml/m  AORTIC VALVE AV Area (Vmax):    0.43 cm AV Area (Vmean):   0.44 cm AV Area (VTI):     0.35 cm AV Vmax:           303.50 cm/s AV Vmean:          224.000 cm/s AV VTI:            0.749 m AV Peak Grad:      36.8 mmHg AV Mean Grad:      23.0 mmHg LVOT Vmax:         45.50 cm/s LVOT Vmean:        34.600 cm/s LVOT VTI:          0.092 m LVOT/AV VTI ratio: 0.12  AORTA Ao Root diam: 3.30 cm MITRAL VALVE                TRICUSPID VALVE MV Area (PHT): 5.02 cm     TR Peak grad:   37.2 mmHg MV Decel Time: 151 msec     TR Vmax:        305.00 cm/s MR Peak grad: 32.0 mmHg MR Mean grad: 20.0 mmHg     SHUNTS MR Vmax:      283.00 cm/s   Systemic VTI:  0.09 m MR Vmean:     209.5 cm/s    Systemic Diam: 1.90 cm MV E velocity: 135.00 cm/s Vishnu Priya  Mallipeddi Electronically signed by Lorelee Cover Mallipeddi Signature Date/Time: 12/22/2021/8:30:56 PM    Final    DG Chest Port 1 View  Result Date: 12/22/2021 CLINICAL DATA:  Weakness, respiratory distress EXAM: PORTABLE CHEST 1 VIEW COMPARISON:  02/08/2021 FINDINGS: Large right pleural effusion, increased since prior study. Moderate left pleural effusion, also increased. Increasing airspace disease throughout the right lung and in the left lower lobe. Heart is normal size. Prior CABG. IMPRESSION: Large right pleural effusion and moderate left pleural effusion. Airspace disease throughout the right lung and left lower lobe could reflect atelectasis or infiltrate/pneumonia. Electronically Signed   By: Rolm Baptise M.D.   On: 12/22/2021 09:53   PERIPHERAL VASCULAR CATHETERIZATION  Result Date: 12/13/2021 Images from the original result were not included. Patient name: Boen Sterbenz. MRN: [id number] DOB: 1931/11/21 Sex: male 12/13/2021 Pre-operative Diagnosis: Chronic left lower extremity limb threatening ischemia with toe ulceration Post-operative diagnosis:  Same Surgeon:  Erlene Quan C. Donzetta Matters, MD Procedure Performed: 1.  Ultrasound-guided cannulation right common femoral artery 2.  Aortogram and bilateral lower extremity angiography 3.  Stent of left SFA with 6 x 60 mm Elluvia 4.  mynx device closure right common femoral artery Indications: 88 year old male with chronic bilateral lower extremity threatening ischemia with ulceration of the left great toe and right medial ankle severe depressed ABIs.  Motor pressure calculated on the left given the ulceration.  He indicated for angiography with possible intervention. Findings: Aortoiliac segments are heavily calcified although there is no flow-limiting stenosis.  Bilateral renal arteries are patent.  Left common femoral artery heavily calcified with approximate 50% stenosis that was not treated.  Left SFA also heavily calcified there is 1 area in the mid  segment with 80% stenosis that was stented to less than 20% residual stenosis with heavy calcification.  Distally on the left there is approximately 40% stenosis of the distal SFA proximal  popliteal artery and then below the knee there is no visible anterior tibial artery.  The peroneal artery has initial 50% stenosis within the runoff to the ankle.  Posterior tibial artery initially has runoff within appears to occlude for proximally 2 cm in the distal segment and then reconstitutes into the foot and no intervention was undertaken below the knee.  On the right side the patient has heavily calcified common femoral artery at least 50% stenosis and then heavily calcified SFA and popliteal arteries above the knee and are patent appears to be occluded at the below the knee popliteal artery level with reconstitution of posterior tibial artery. Patient to follow-up in a few weeks to evaluate for wound healing and can consider scheduling right lower extremity angiogram at that time.  Procedure:  The patient was identified in the holding area and taken to room 8.  The patient was then placed supine on the table and prepped and draped in the usual sterile fashion.  A time out was called.  Ultrasound was used to evaluate the right common femoral artery which was calcified.  The area was anesthetized with 1% lidocaine cannulated micropuncture needle followed by wire and sheath.  Images saved the permanent record.  We placed a Bentson wire followed by 5 [place] sheath performed aortogram.  We then crossed the bifurcation with Bentson wire and Navi cross catheter for left lower extremity angiogram.  With the above findings we elected for treatment.  We placed a Glidewire advantage followed by a long 6 [place] sheath patient was heparinized with 6000 units of heparin.  We then crossed into the distal popliteal artery performed further angiography below the knee to better define the tibial vessels.  We then primarily stented the SFA  with a 6 x 60 mm drug-eluting stent and postdilated this with 5 mm balloon to 0% residual stenosis.  At this time we retracted the sheath into the right external iliac artery and perform right lower extremity angiography which was limited by patient's contracture of his right knee and motion degradation.  We then exchanged for a short 6 French sheath over the Glidewire advantage and then deployed a minx device.  He tolerated the procedure without any complication. Contrast: 90 cc Brandon C. Donzetta Matters, MD [facility] Office: [phone] Pager: [phone]   VAS [place] ABI WITH/WO TBI  Result Date: 12/08/2021  LOWER EXTREMITY DOPPLER STUDY Patient Name:  AVENIR LOZINSKI.  Date of Exam:   12/08/2021 Medical Rec #: [id number]          Accession #:    [id number] Date of Birth: [birth year]          Patient Gender: M Patient Age:   6 years Exam Location:  [facility] Procedure:      VAS [place] ABI WITH/WO TBI Referring Phys: CHRISTOPHER DICKSON --------------------------------------------------------------------------------  Indications: Ulceration, and peripheral artery disease. High Risk Factors: Hypertension, hyperlipidemia.  Comparison Study: No prior ABI                   12/03/21 US Arterial lower extremity duplex left- Abnormal                   monophasic arterial waveforms throughout the left lower                   extremity arterial tree. Findings suggest proximal inflow                   (  aortoiliac) occlusive disease. Performing Technologist: Maudry Mayhew MHA, RVT, RDCS, RDMS  Examination Guidelines: A complete evaluation includes at minimum, Doppler waveform signals and systolic blood pressure reading at the level of bilateral brachial, anterior tibial, and posterior tibial arteries, when vessel segments are accessible. Bilateral testing is considered an integral part of a complete examination. Photoelectric Plethysmograph (PPG) waveforms and toe systolic  pressure readings are included as required and additional duplex testing as needed. Limited examinations for reoccurring indications may be performed as noted.  ABI Findings: +---------+------------------+-----+----------+--------+ Right    Rt Pressure (mmHg)IndexWaveform  Comment  +---------+------------------+-----+----------+--------+ Brachial 133                                       +---------+------------------+-----+----------+--------+ PTA      Noncompressible        monophasic         +---------+------------------+-----+----------+--------+ DP                              absent             +---------+------------------+-----+----------+--------+ Great Toe0                 0.00 absent             +---------+------------------+-----+----------+--------+ +---------+------------------+-----+-------------------+-----------------------+ Left     Lt Pressure (mmHg)IndexWaveform           Comment                 +---------+------------------+-----+-------------------+-----------------------+ Brachial 143                                                               +---------+------------------+-----+-------------------+-----------------------+ PTA      Noncompressible        monophasic                                 +---------+------------------+-----+-------------------+-----------------------+ DP       Noncompressible        dampened monophasic                        +---------+------------------+-----+-------------------+-----------------------+ Great Toe                       Abnormal           Unable to obtain                                                           pressure secondary to                                                      ulcer location          +---------+------------------+-----+-------------------+-----------------------+ +-------+---------------+-----------+------------+------------+ ABI/TBIToday's  ABI     Today's TBIPrevious ABIPrevious TBI +-------+---------------+-----------+------------+------------+ Right  Noncompressible0.00                                +-------+---------------+-----------+------------+------------+ Left   Noncompressible                                    +-------+---------------+-----------+------------+------------+  Summary: Right: Resting right ankle-brachial index indicates noncompressible right lower extremity arteries. Absent right great toe dorsalis pedis artery waveform, absent right great toe PPG waveform. Left: Resting left ankle-brachial index indicates noncompressible left lower extremity arteries. Left great toe PPG waveform is pressent and abnormal, however could not obtain pressure secondary to ulcer location. *See table(s) above for measurements and observations.  Electronically signed by Servando Snare MD on 12/08/2021 at 4:07:10 PM.    Final    [place] Lower Ext Art Left  Result Date: 12/03/2021 CLINICAL DATA:  Left lower extremity wounds EXAM: LEFT LOWER EXTREMITY ARTERIAL DUPLEX SCAN TECHNIQUE: Gray-scale sonography as well as color Doppler and duplex ultrasound was performed to evaluate the lower extremity arteries including the common, superficial and profunda femoral arteries, popliteal artery and calf arteries. COMPARISON:  None Available. FINDINGS: Left lower Extremity Inflow: Abnormal monophasic arterial waveforms. Heterogeneous atherosclerotic plaque. Outflow: Abnormal monophasic arterial waveforms throughout. Runoff: Abnormal monophasic arterial waveforms with diminished flow. IMPRESSION: Abnormal monophasic arterial waveforms throughout the left lower extremity arterial tree. 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 Electronically Signed   By: Jacqulynn Cadet M.D.   On: 12/03/2021 16:23   DG Foot Complete Left  Result Date: 12/03/2021 CLINICAL DATA:  Left great toe wound EXAM: LEFT FOOT - COMPLETE 3+ VIEW COMPARISON:   Ankle radiographs 02/22/2018 FINDINGS: Bony demineralization. Extensive atherosclerotic vascular calcifications. No bony destructive findings characteristic of active osteomyelitis. No gas tracking in the soft tissues is identified. Dorsal soft tissue swelling along the forefoot. Moderate central collapse of the calcaneus related to prior fracture. Cannot exclude pes planus. Plantar and Achilles calcaneal spurs. IMPRESSION: 1. No bony destructive findings characteristic of active osteomyelitis. 2. Dorsal soft tissue swelling along the forefoot. 3. Bony demineralization. 4. Plantar and Achilles calcaneal spurs. 5. Moderate central collapse of the calcaneus related to prior fracture. Electronically Signed   By: Van Clines M.D.   On: 12/03/2021 14:26   DG Hand Complete Left  Result Date: 12/03/2021 CLINICAL DATA:  Wound of the great toe and wound of the left hand EXAM: LEFT HAND - COMPLETE 3+ VIEW COMPARISON:  None Available. FINDINGS: Substantially abnormal distal phalanx middle finger with poor definition of normal cortical boundaries and extensive amorphous fragmentation. Cannot exclude underlying lucent expansile destructive lesion or osteomyelitis. Remote severe trauma or insult could possibly cause a similar appearance, correlate with patient history and symptoms involving the distal middle finger. Faint linear calcifications in the soft tissues of the ring and index fingers along the ulnar side of the middle phalanges. The fingers are mildly flexed during imaging, which affects diagnostic accuracy in assessing the phalanges. Substantial arterial atherosclerosis. Mild radial sided spurring of the head of the index finger metacarpal. Mild degenerative findings at the thumb and index finger MCP joints. IMPRESSION: 1. Substantially abnormal appearance of the distal phalanx of the middle finger with extensive amorphous fragmentation and poor definition of the cortical boundaries. Cannot exclude  underlying lucent expansile destructive lesion or osteomyelitis. Remote severe trauma  or insult could possibly cause a similar appearance, correlate with patient history and symptoms. 2. Substantial arterial atherosclerosis. 3. Mild degenerative findings at the thumb and index finger MCP joints. Electronically Signed   By: Van Clines M.D.   On: 12/03/2021 14:23    Orson Eva, DO  [facility]  If 7PM-7AM, please contact night-coverage [email or web address] Password [email or web address] 12/23/2021, 2:14 PM   LOS: 1 day

## 2021-12-23 NOTE — Consult Note (Signed)
Cardiology Consultation   Patient ID: Danny [name]. MRN: [id number]; DOB: 1931-08-13  Admit date: 12/22/2021 Date of Consult: 12/23/2021  PCP:  Baruch Gouty, [place] [facility] Cardiologist:  Minus Breeding, MD        Patient Profile:   Danny [name]. is a 88 y.o. male with a hx of CAD (s/p CABG in 2003 with LIMA-LAD, RIMA-RI and SVG-OM3, cath in 01/2020 showing patent LIMA-LAD and SVG-OM with atretic RIMA to RI and medical management recommended), HFrEF (EF 40-45% in 01/2021 with severe RV dysfunction), aortic stenosis, persistent atrial fibrillation (no longer on aticoagulation due to frequent falls), HTN, HLD, PAD (s/p L CEA in 2009) and GERD who is being seen 12/23/2021 for the evaluation of CHF and aortic stenosis at the request of Dr. Carles Collet.  History of Present Illness:   [name] was last examined by Dr. Percival Spanish in 10/2021 and was being managed conservatively in regards to his cardiomyopathy and valve disease given his dementia and DNR status. His weight was at 175 lbs he was felt to be volume overloaded, therefore was recommend he take Lasix '40mg'$  daily for 4 days with close follow-up labs. Statin therapy was discontinued in case this was contributing to his leg weakness.  In the interim, he was diagnosed with left lower extremity limb threatening ischemia with toe ulceration and underwent aortogram and bilateral lower extremity angiography with eventual stenting of the left SFA by Dr. Donzetta Matters on 12/13/2021.  The patient's wife called the office earlier this week reporting he was short of breath and had gained 7 pounds within 2 weeks and had recently been prescribed Metolazone by his PCP but this had never been filled. He had been bed-bound and his case was reviewed with Dr. Percival Spanish who recommended the use of Metolazone and was recommended to ask his primary care provider to see if hospice or palliative care would be appropriate given his declining health.    He presented to [facility] ED on 12/22/2021 for evaluation of worsening falls and dyspnea. He was found to be hypoxic with oxygen saturation at 75% on room air upon EMS arrival and required NRB and eventual BiPAP. Initial labs showed WBC 7.1, Hgb 14.7, platelets 154, Na+ 137, K+ 5.3 and creatinine 0.97.  BNP elevated to 1048. Initial lactic acid 3.2 with repeat of 2.8.  Procalcitonin 0.14. Initial and repeat troponin values negative at 15 and 12.  CXR showed a large right pleural effusion and moderate left pleural effusion with air space disease throughout the right lung and left lower lobe which could reflect atelectasis or infiltrate. EKG showed atrial fibrillation with RVR, HR 117 with baseline artifact.   He was placed on BiPAP at the time of admission and started on IV Lasix 40 mg twice daily. Follow-up echocardiogram showed his EF was further reduced at 30 to 35% with no regional wall motion abnormalities. Also noted to have severe RV dysfunction, severe TR and low-flow/low gradient severe AS.  He has been responding well to IV Lasix with a recorded net output of -3.4 L thus far. His weight was recorded at 174 lbs on admission and is at 166 lbs today. Repeat labs this morning show his K+ is normal at 4.1 and creatinine is at 1.20. Mg 2.2.  In talking with the patient today, he will awaken to voice and answer questions but quickly falls back asleep. Was able to be weaned off BiPAP this morning. Says that he had  noticed worsening shortness of breath at rest for the past several days leading up to admission and feels like this is now starting to improve. No recent chest pain or palpitations. He is unsure of any new lower extremity edema or changes in his weight. No family members are currently at the bedside.   Past Medical History:  Diagnosis Date   Anxiety    Atrial fibrillation ([place])    CAD, ARTERY BYPASS GRAFT 05/19/2008   Qualifier: Diagnosis of  By: Mare Ferrari, RMA, Sherri     Enteritis due  to Clostridium difficile, history of 11/21/2012   GERD (gastroesophageal reflux disease)    Hyperlipidemia    Hypertension    Internal carotid artery stenosis    Shingles 06/2020   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 01/14/2014   Vitamin D deficiency     Past Surgical History:  Procedure Laterality Date   ABDOMINAL AORTOGRAM W/LOWER EXTREMITY N/A 12/13/2021   Procedure: ABDOMINAL AORTOGRAM W/LOWER EXTREMITY;  Surgeon: Waynetta Sandy, MD;  Location: [facility];  Service: Cardiovascular;  Laterality: N/A;   CARDIOVERSION N/A 05/05/2016   Procedure: CARDIOVERSION;  Surgeon: Josue Hector, MD;  Location: [facility] ENDOSCOPY;  Service: Cardiovascular;  Laterality: N/A;   CAROTID ENDARTERECTOMY Left 2003   CAROTID-SUBCLAVIAN BYPASS GRAFT Right    COLONOSCOPY  2005   hyperplastic polyp x 1 with no adenoma   COLONOSCOPY N/A 04/11/2014   TIW:PYKD diverticulosis in the sigmoid colon/left colon is redundant   CORONARY ARTERY BYPASS GRAFT     ESOPHAGEAL DILATION N/A 04/11/2014   Procedure: ESOPHAGEAL DILATION;  Surgeon: [name], MD;  Location: [facility];  Service: Endoscopy;  Laterality: N/A;   ESOPHAGOGASTRODUODENOSCOPY N/A 04/11/2014   XIP:[id number] at the gastro junction/moderate erosive/duodenal web   EYE SURGERY Bilateral    cataracts   PERIPHERAL VASCULAR INTERVENTION  12/13/2021   Procedure: PERIPHERAL VASCULAR INTERVENTION;  Surgeon: Waynetta Sandy, MD;  Location: [facility];  Service: Cardiovascular;;   RIGHT/LEFT HEART CATH AND CORONARY/GRAFT ANGIOGRAPHY N/A 02/11/2020   Procedure: RIGHT/LEFT HEART CATH AND CORONARY/GRAFT ANGIOGRAPHY;  Surgeon: Martinique, Peter M, MD;  Location: [facility];  Service: Cardiovascular;  Laterality: N/A;   SKIN CANCER EXCISION       Home Medications:  Prior to Admission medications   Medication Sig Start Date End Date Taking? Authorizing Provider  acetaminophen (TYLENOL) 325 MG tablet Take 650  mg by mouth every 6 (six) hours as needed.   Yes [provider]  aspirin 81 MG chewable tablet Chew 81 mg by mouth daily. atrial fibrillation   Yes [provider]  cholecalciferol (VITAMIN D) 25 MCG (1000 UNIT) tablet Take 1,000 Units by mouth daily.   Yes [provider]  furosemide (LASIX) 20 MG tablet TAKE 1 TABLET EVERY DAY 10/18/21  Yes [name] F, FNP  levocetirizine (XYZAL) 5 MG tablet TAKE 1 TABLET EVERY EVENING 11/16/21  Yes Rakes, Connye Burkitt, FNP  metoprolol succinate (TOPROL-XL) 25 MG 24 hr tablet Take 1 tablet (25 mg total) by mouth daily. 10/27/21  Yes Rakes, Connye Burkitt, FNP  omeprazole (PRILOSEC) 20 MG capsule Take 1 capsule (20 mg total) by mouth 2 (two) times daily before a meal. 06/29/21  Yes [name] F, FNP  potassium chloride SA (KLOR-CON M) 20 MEQ tablet Take 2 tablets (40 mEq total) by mouth daily. 07/19/21  Yes [name] F, FNP  metolazone (ZAROXOLYN) 2.5 MG tablet Take 1 tablet (2.5 mg total)  by mouth daily for 2 days. Take 30 minutes prior to lasix 11/30/21 12/02/21  Baruch Gouty, FNP    Inpatient Medications: Scheduled Meds:  aspirin  81 mg Oral Daily   Chlorhexidine Gluconate Cloth  6 each Topical Q0600   furosemide  40 mg Intravenous BID   heparin  5,000 Units Subcutaneous Q8H   mouth rinse  15 mL Mouth Rinse 4 times per day   sodium chloride flush  3 mL Intravenous Q12H   Continuous Infusions:  sodium chloride     PRN Meds: sodium chloride, acetaminophen, ondansetron (ZOFRAN) IV, mouth rinse, sodium chloride flush  Allergies:    Allergies  Allergen Reactions   Doxycycline Diarrhea   Contrast Media [Iodinated Contrast Media]     Syncope   Lunesta [Eszopiclone]     "felt WILD"   Morphine And Related Nausea And Vomiting   Trazodone And Nefazodone Other (See Comments)    Extremely drowsy the next day after taking.    Social History:   Social History   Socioeconomic History   Marital status: Married    Spouse name:  ruby   Number of children: 1   Years of education: Not on file   Highest education level: Not on file  Occupational History   Occupation: retired    Fish farm manager: [employer]  Tobacco Use   Smoking status: Former    Types: Cigarettes    Start date: 01/24/1950    Quit date: 01/24/1961    Years since quitting: 60.9   Smokeless tobacco: Never  Vaping Use   Vaping Use: Never used  Substance and Sexual Activity   Alcohol use: Not Currently    Alcohol/week: 0.0 standard drinks of alcohol    Comment: not drank since 12/2009   Drug use: No   Sexual activity: Yes    Partners: Female  Other Topics Concern   Not on file  Social History Narrative   Not on file   Social Determinants of Health   Financial Resource Strain: Not on file  Food Insecurity: No Food Insecurity (12/07/2021)   Hunger Vital Sign    Worried About Running Out of Food in the Last Year: Never true    [place] in the Last Year: Never true  Transportation Needs: No Transportation Needs (12/07/2021)   PRAPARE - Hydrologist (Medical): No    Lack of Transportation (Non-Medical): No  Physical Activity: Not on file  Stress: Not on file  Social Connections: Not on file  Intimate Partner Violence: Not on file    Family History:   Family History  Problem Relation Age of Onset   Heart disease Mother    Stroke Mother    Stroke Father    Diabetes Son    Stroke Son    Other Sister        ruptured appendix    Lung cancer Brother        asbestos   Mental illness Sister    Kidney disease Sister    Colon cancer Neg Hx        "not that I know of."     ROS:  Please see the history of present illness.   All other ROS reviewed and negative.     Physical Exam/Data:   Vitals:   12/23/21 0559 12/23/21 0700 12/23/21 0729 12/23/21 0737  BP: 92/65 (!) 94/55  (!) 94/55  Pulse: 90 90 83 93  Resp: (!) 25 (!) 28 (!) 26 19  Temp:      TempSrc:      SpO2: 100% 100% 100% 99%  Weight:      Height:         Intake/Output Summary (Last 24 hours) at 12/23/2021 0800 Last data filed at 12/23/2021 6433 Gross per 24 hour  Intake 322.32 ml  Output 3250 ml  Net -2927.68 ml      12/23/2021    5:00 AM 12/23/2021    3:51 AM 12/22/2021    2:00 PM  Last 3 Weights  Weight (lbs) 166 lb 7.2 oz 166 lb 7.2 oz 174 lb 13.2 oz  Weight (kg) 75.5 kg 75.5 kg 79.3 kg     Body mass index is 21.37 kg/m.  General: Frail, elderly male appearing in no acute distress. HEENT: normal Neck: JVD at 10-11 cm.  Vascular: No carotid bruits; Distal pulses 2+ bilaterally Cardiac:  normal S1, S2; Irregularly irregular. 3/6 systolic murmur along RUSB. Lungs: decreased breath sounds along bases with scattered rhonchi.  Abd: soft, nontender, no hepatomegaly  Ext: 1+ pitting edema bilaterally.  Musculoskeletal:  No deformities, BUE and BLE strength normal and equal Skin: warm and dry  Neuro:  CNs 2-12 intact, no focal abnormalities noted Psych:  Normal affect   EKG:  The EKG was personally reviewed and demonstrates: Atrial fibrillation with RVR, HR 117 with baseline artifact.   Telemetry:  Telemetry was personally reviewed and demonstrates: Atrial fibrillation, HR in 80's to 90's, peaking into low-100's. Occasional PVC's.   Relevant CV Studies:  R/LHC: 01/2020 Ost LAD to Prox LAD lesion is 100% stenosed. Ost LM to Mid LM lesion is 100% stenosed. Prox RCA to Mid RCA lesion is 25% stenosed. RPDA lesion is 35% stenosed. SVG graft was visualized by angiography and is normal in caliber. The graft exhibits no disease. RIMA graft was visualized by angiography. LIMA graft was visualized by angiography and is normal in caliber. The graft exhibits no disease. The left ventricular systolic function is normal. LV end diastolic pressure is normal. The left ventricular ejection fraction is 55-65% by visual estimate.   1. Left main ostial occlusion 2. Nonobstructive disease in the RCA 3. Patent LIMA to the LAD 4.  Atretic RIMA to the ramus intermediate. The Ramus intermediate fills retrograde from the LCx graft 5. Patent SVG to the OM 6. The right subclavian bypass is widely patent 7. Normal right heart pressures 8. Normal LV filling pressures 9. Normal cardiac output.    Plan: continue medical management.   Echocardiogram: 12/22/2021 IMPRESSIONS     1. Left ventricular ejection fraction, by estimation, is 30 to 35%. The  left ventricle has moderately decreased function. The left ventricle has  no regional wall motion abnormalities. Left ventricular diastolic function  could not be evaluated. There is  the interventricular septum is flattened in systole and diastole,  consistent with right ventricular pressure and volume overload. RV volume  overload causing low LV filling output.   2. Severe right ventricular enlargement. Severe right ventricular  systolic dysfunction.   3. Left atrial size was severely dilated.   4. Right atrial size was severely dilated.   5. The mitral valve is abnormal. Trivial mitral valve regurgitation. No  evidence of mitral stenosis.   6. Tricuspid valve regurgitation is severe. Unable to calculate pulmonary  artery systolic pressure due to rapid pressure equilibration between RV  and RA from severe TR.   7. The aortic valve is abnormal. There is severe calcifcation of the  aortic valve. Aortic  valve regurgitation is not visualized. There is low  flow-low gradient severe aortic valve stenosis with V max 3.12 m/s, mean  PG 24 mm Hg, Ao valve area by  continuity equation 0.35 cm2 and SVI 12.7 ml/m2. Cardiology consult is  recommended.   Comparison(s): Changes from prior study are noted. LVEF decreased to  30-35%, RV function severely reduced and low flow-low gradient severe  aortic valve stenosis now.    Laboratory Data:  High Sensitivity Troponin:   Recent Labs  Lab 12/22/21 1147 12/22/21 1302  TROPONINIHS 15 12     Chemistry Recent Labs  Lab  12/22/21 1259 12/22/21 1302 12/23/21 0600  NA 137 137 137  K 5.3 5.3* 4.1  CL 102 100 100  CO2 '21 26 27  '$ GLUCOSE 80 86 85  BUN 34 36* 37*  CREATININE 0.97 1.09 1.20  CALCIUM 8.2 8.5* 8.2*  MG  --   --  2.2  GFRNONAA >60 >60 58*  ANIONGAP '14 11 10    '$ Recent Labs  Lab 12/22/21 1302  PROT 6.1*  ALBUMIN 2.7*  AST 29  ALT 26  ALKPHOS 130*  BILITOT 2.1*   Lipids No results for input(s): "CHOL", "TRIG", "HDL", "LABVLDL", "LDLCALC", "CHOLHDL" in the last 168 hours.  Hematology Recent Labs  Lab 12/22/21 0920 12/23/21 0600  WBC 7.1 9.2  RBC 4.58 3.84*  HGB 14.7 12.1*  HCT 44.1 36.6*  MCV 96.3 95.3  MCH 32.1 31.5  MCHC 33.3 33.1  RDW 18.6* 17.1*  PLT 154 200   Thyroid No results for input(s): "TSH", "FREET4" in the last 168 hours.  BNP Recent Labs  Lab 12/22/21 0920  BNP 1,048.0*    DDimer No results for input(s): "DDIMER" in the last 168 hours.   Radiology/Studies:  DG CHEST PORT 1 VIEW  Result Date: 12/23/2021 CLINICAL DATA:  [id number] with bilateral pleural effusions and systolic CHF, respiratory distress. EXAM: PORTABLE CHEST 1 VIEW COMPARISON:  Portable chest yesterday at 9:44 a.m. FINDINGS: 4:51 a.m. CABG changes, cardiomegaly and a heavily calcified thoracic aorta are again noted. There is central vascular prominence which appears similar, large right and moderate left pleural effusions and either a mild improvement in the right effusion or redistribution. Overlying atelectasis or airspace disease on the right-greater-than-left appear similar on the left but improved on the right, with the right lower lung field still obscured by the fluid. The lung fields are otherwise clear. In all other respects there are no further changes. IMPRESSION: 1. Mild improvement in the right pleural effusion versus fluid redistribution. 2. Cardiomegaly with central vascular congestion and right-greater-than-left pleural effusions with overlying atelectasis or airspace disease. 3. Aortic  atherosclerosis. Electronically Signed   By: Telford Nab M.D.   On: 12/23/2021 06:09    DG Chest Port 1 View  Result Date: 12/22/2021 CLINICAL DATA:  Weakness, respiratory distress EXAM: PORTABLE CHEST 1 VIEW COMPARISON:  02/08/2021 FINDINGS: Large right pleural effusion, increased since prior study. Moderate left pleural effusion, also increased. Increasing airspace disease throughout the right lung and in the left lower lobe. Heart is normal size. Prior CABG. IMPRESSION: Large right pleural effusion and moderate left pleural effusion. Airspace disease throughout the right lung and left lower lobe could reflect atelectasis or infiltrate/pneumonia. Electronically Signed   By: Rolm Baptise M.D.   On: 12/22/2021 09:53     Assessment and Plan:   1. Acute HFrEF/RV Dysfunction - He has a known cardiomyopathy with EF at 40-45% by echo in 01/2021 and further reduced at  30 to 35% with severe RV dysfunction. BNP was elevated at 1048 on admission and he has been started on IV Lasix 40 mg twice daily and is responding well thus far with a recorded net output of -3.4 L and weight is recorded at 166 lbs today. Prior weight was at 175 lbs in 10/2021 but notes mention he has mostly been bedbound over the past month. Therefore, suspect he has lost fat/muscle mass and gained more fluid weight. Will need to establish a new dry weight.  - Creatinine is overall stable at 1.20 and given his volume overload, would continue with IV Lasix at current dosing. If renal function limits diuresis, would consider thoracentesis. Toprol-XL held due to hypotension and not a candidate for ACE-I/ARB/ARNI/MRA due to this and would not use an SGLT2 inhibitor given his lower extremity wounds and high-risk for infection.  - As discussed during his prior office visit with Dr. Percival Spanish, he is not an ideal candidate for aggressive intervention given his age, dementia and DNR status. Agree with Palliative Care consult as this seems most  appropriate at this time.   2. Aortic Stenosis/Tricuspid Regurgitation - Repeat echocardiogram this admission shows severe TR and low-flow/low gradient severe AS. Given his multiple medical issues and overall frail state, he is not a candidate for aggressive intervention. Agree with Palliative Care consult.  3. CAD - He is s/p CABG in 2003 with LIMA-LAD, RIMA-RI and SVG-OM3 and most recent cath in 01/2020 showed patent LIMA-LAD and SVG-OM with atretic RIMA to RI and medical management was recommended. - Hs Troponin values have been negative this admission. Remains on ASA '81mg'$  daily. Toprol-XL currently held due to intermittent hypotension. Statin therapy was previously discontinued by Dr. Percival Spanish in case this was contributing to his lower extremity weakness.   4. Persistent Atrial Fibrillation - His rates were initially elevated in the 110's but have improved with improvement in his respiratory status. PTA Toprol-XL currently held given hypotension and would continue holding for now since rates are currently in the 80's and BP remains variable.  - This patients CHA2DS2-VASc Score and unadjusted Ischemic Stroke Rate (% per year) is equal to 7.2 % stroke rate/year from a score of 5 (CHF, HTN, Vascular, Age (2)). He is no longer on anticoagulation due to frequent falls.   5. PAD - He previously underwent L CEA in 2009 and most recently underwent stenting of the left SFA on 12/13/2021. Remains on ASA. Appears he was not started on Plavix following his procedure by review of the Hospital Discharge AVS.    For questions or updates, please contact [place] Please consult [email or web address] for contact info under    Signed, Erma Heritage, PA-C  12/23/2021 8:00 AM

## 2021-12-23 NOTE — TOC Initial Note (Signed)
Transition of Care (TOC) - Initial/Assessment Note    Patient Details  Name: Danny [name]. MRN: [id number] Date of Birth: 08-Dec-1931  Transition of Care [facility]) CM/SW Contact:    Ihor Gully, LCSW Phone Number: 12/23/2021, 2:15 PM  Clinical Narrative:                 Per palliative care NP, patient referred to [place] for hospice services at home.   Expected Discharge Plan: Home w Hospice Care Barriers to Discharge: Continued Medical Work up   Patient Goals and CMS Choice        Expected Discharge Plan and Services Expected Discharge Plan: [place]                                              Prior Living Arrangements/Services                       Activities of Daily Living Home Assistive Devices/Equipment: Dentures (specify type), Eyeglasses, Wheelchair ADL Screening (condition at time of admission) Patient's cognitive ability adequate to safely complete daily activities?: No Is the patient deaf or have difficulty hearing?: Yes (per family) Does the patient have difficulty seeing, even when wearing glasses/contacts?: No Does the patient have difficulty concentrating, remembering, or making decisions?: Yes Patient able to express need for assistance with ADLs?: Yes Does the patient have difficulty dressing or bathing?: Yes Independently performs ADLs?: No Communication: Independent Dressing (OT): Needs assistance Is this a change from baseline?: Pre-admission baseline Grooming: Needs assistance Is this a change from baseline?: Pre-admission baseline Feeding: Independent Bathing: Needs assistance Is this a change from baseline?: Pre-admission baseline Toileting: Needs assistance Is this a change from baseline?: Pre-admission baseline In/Out Bed: Needs assistance Is this a change from baseline?: Pre-admission baseline Walks in Home: Dependent (wheelchair bound per family) Is this a change from baseline?: Pre-admission  baseline Does the patient have difficulty walking or climbing stairs?: Yes Weakness of Legs: Both Weakness of Arms/Hands: Both  Permission Sought/Granted                  Emotional Assessment              Admission diagnosis:  Acute on chronic systolic congestive heart failure (HCC) [I50.23] HCAP (healthcare-associated pneumonia) [J18.9] Acute on chronic systolic CHF (congestive heart failure) ([place]) [I50.23] Patient Active Problem List   Diagnosis Date Noted   Acute on chronic systolic CHF (congestive heart failure) ([place]) 12/22/2021   Acute respiratory failure with hypoxia and hypercarbia ([place]) 12/22/2021   Hx of CABG 10/27/2021   Mixed hyperlipidemia 09/29/2021   Major neurocognitive disorder ([place]) 05/25/2021   Edema of both lower extremities 04/30/2020   Gastroesophageal reflux disease 04/30/2020   Pulmonary HTN ([place]) 02/11/2020   Persistent atrial fibrillation ([place]) 10/14/2019   Vitamin D deficiency    Difficulty sleeping 10/02/2018   Paroxysmal atrial fibrillation ([place]) 08/01/2016   Thoracic aorta atherosclerosis ([place]) 01/14/2014   Hyperplasia of prostate 05/18/2010   Carotid stenosis 05/18/2010   Essential hypertension 05/19/2008   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

## 2021-12-23 NOTE — Progress Notes (Addendum)
Albumin ordered for low Bps.  Pt CBG reassessed at this time.  CBG 73.  At this time, pt is awake, alert, responding to questions.  Offered fluids.  Pt able to tolerate 180 ml apple juice by mouth in upright position using slow sips.  Pt able to communicate needs including dry mouth and pain in left toe.  Oral cares provided for patient.  While drinking juice and performing oral cares, pt off BiPAP for approx 5 minutes, sats maintained >96% and pt denies SHOB.  Toradol given for pain and blankets fluffed over foot of bed for comfort.  CHG bath provided.  Pt able to request channel to watch on TV.  PT notified after cares regarding pt status while off BiPAP.

## 2021-12-24 DIAGNOSIS — J9601 Acute respiratory failure with hypoxia: Secondary | ICD-10-CM | POA: Diagnosis not present

## 2021-12-24 DIAGNOSIS — F03B0 Unspecified dementia, moderate, without behavioral disturbance, psychotic disturbance, mood disturbance, and anxiety: Secondary | ICD-10-CM | POA: Diagnosis not present

## 2021-12-24 DIAGNOSIS — I5023 Acute on chronic systolic (congestive) heart failure: Secondary | ICD-10-CM | POA: Diagnosis not present

## 2021-12-24 DIAGNOSIS — L03115 Cellulitis of right lower limb: Secondary | ICD-10-CM | POA: Diagnosis not present

## 2021-12-24 DIAGNOSIS — J9602 Acute respiratory failure with hypercapnia: Secondary | ICD-10-CM | POA: Diagnosis not present

## 2021-12-24 DIAGNOSIS — Z7189 Other specified counseling: Secondary | ICD-10-CM

## 2021-12-24 LAB — BASIC METABOLIC PANEL
Anion gap: 11 (ref 5–15)
BUN: 31 mg/dL — ABNORMAL HIGH (ref 8–23)
CO2: 30 mmol/L (ref 22–32)
Calcium: 8.1 mg/dL — ABNORMAL LOW (ref 8.9–10.3)
Chloride: 95 mmol/L — ABNORMAL LOW (ref 98–111)
Creatinine, Ser: 1.12 mg/dL (ref 0.61–1.24)
GFR, Estimated: >60 mL/min (ref >60)
Glucose, Bld: 74 mg/dL (ref 70–99)
Potassium: 3.3 mmol/L — ABNORMAL LOW (ref 3.5–5.1)
Sodium: 136 mmol/L (ref 135–145)

## 2021-12-24 LAB — CBC
HCT: 37.1 % — ABNORMAL LOW (ref 39.0–52.0)
Hemoglobin: 12.2 g/dL — ABNORMAL LOW (ref 13.0–17.0)
MCH: 31.3 pg (ref 26.0–34.0)
MCHC: 32.9 g/dL (ref 30.0–36.0)
MCV: 95.1 fL (ref 80.0–100.0)
Platelets: 200 10*3/uL (ref 150–400)
RBC: 3.9 MIL/uL — ABNORMAL LOW (ref 4.22–5.81)
RDW: 17.1 % — ABNORMAL HIGH (ref 11.5–15.5)
WBC: 8.4 10*3/uL (ref 4.0–10.5)
nRBC: 0 % (ref 0.0–0.2)

## 2021-12-24 MED ORDER — POTASSIUM CHLORIDE CRYS ER 20 MEQ PO TBCR
40.0000 meq | EXTENDED_RELEASE_TABLET | Freq: Once | ORAL | Status: AC
  Administered 2021-12-24: 40 meq via ORAL
  Filled 2021-12-24: qty 2

## 2021-12-24 NOTE — Evaluation (Signed)
Occupational Therapy Evaluation Patient Details Name: Danny [name]. MRN: [id number] DOB: [birth year] Today's Date: 12/24/2021   History of Present Illness Danny [name]. is 88 year old with a history of dementia, persistent atrial fibrillation, coronary artery disease, hypertension, hyperlipidemia, pulmonary hypertension, aortic stenosis, peripheral vascular disease presenting with 2 to 3-day history of progressive generalized weakness and shortness of breath. At baseline, the patient is normally able to initiate transfer to his wheelchair with which he normally gets around. However in the last 24 hours he has not been able to get out of bed. He has increasing shortness of breath, increasing lower extremity edema and increasing abdominal girth. Patient himself is a poor historian. History is supplemented from the patient's niece at the bedside. He is in the states that he recently had a stent in his left SFA on 12/13/2021. She states that "he has gone downhill since then". She also states that she has noted worsening cognitive and functional decline over the last 10 months.   Clinical Impression   Pt agreeable to OT evaluation. Pt sitting in recliner at start of session. Pt demonstrated ability to doff and don socks seated in recliner without assist, but standing required moderate assistance and pt was very weak only standing for a couple seconds. Any ADL's requiring standing would require at least moderate assistance. Pt reports being independent with ADL's prior. Pt's B UE are generally weak with minimal limitation in A/ROM for shoulder flexion. Pt was left in chair with call bell within reach and chair alarm set. Pt will benefit from continued OT in the hospital and recommended venue below to increase strength, balance, and endurance for safe ADL's.       Recommendations for follow up therapy are one component of a multi-disciplinary discharge planning process, led by the attending physician.   Recommendations may be updated based on patient status, additional functional criteria and insurance authorization.   Follow Up Recommendations  Skilled nursing-short term rehab (<3 hours/day)     Assistance Recommended at Discharge Frequent or constant Supervision/Assistance  Patient can return home with the following A lot of help with walking and/or transfers;A lot of help with bathing/dressing/bathroom;Assistance with cooking/housework;Assist for transportation;Help with stairs or ramp for entrance    Functional Status Assessment  Patient has had a recent decline in their functional status and/or demonstrates limited ability to make significant improvements in function in a reasonable and predictable amount of time  Equipment Recommendations  None recommended by OT    Recommendations for Other Services       Precautions / Restrictions Precautions Precautions: Fall Restrictions Weight Bearing Restrictions: No      Mobility Bed Mobility               General bed mobility comments:  (Pt upright in recliner at start of session.)    Transfers Overall transfer level: Needs assistance Equipment used: Rolling walker (2 wheels) Transfers: Sit to/from Stand Sit to Stand: Mod assist           General transfer comment: Pt stood from recliner with mod A but was only able to stand for a couple seconds before his knees began to buckle and he needed to sit.      Balance Overall balance assessment: Needs assistance Sitting-balance support: Feet supported, Bilateral upper extremity supported Sitting balance-Leahy Scale: Good Sitting balance - Comments: seated in recliner   Standing balance support: During functional activity, Reliant on assistive device for balance, Bilateral upper extremity supported Standing balance-Leahy Scale:  Poor Standing balance comment: with RW                           ADL either performed or assessed with clinical judgement   ADL  Overall ADL's : Needs assistance/impaired     Grooming: Set up;Sitting   Upper Body Bathing: Minimal assistance;Set up;Sitting   Lower Body Bathing: Sitting/lateral leans;Minimal assistance;Moderate assistance   Upper Body Dressing : Min guard;Minimal assistance;Sitting   Lower Body Dressing: Minimal assistance;Moderate assistance Lower Body Dressing Details (indicate cue type and reason): Pt able to doff and don socks seated in the recliner with extended time. Per difficulty standing pt likely needs assist for pulling pants up for full lower body dressing. Toilet Transfer: Stand-pivot;Moderate assistance;Maximal assistance;Rolling walker (2 wheels) Toilet Transfer Details (indicate cue type and reason): Partially simulated by sit to stand from recliner with RW. Toileting- Clothing Manipulation and Hygiene: Moderate assistance;Sitting/lateral lean               Vision Baseline Vision/History: 1 Wears glasses Ability to See in Adequate Light: 0 Adequate Patient Visual Report: No change from baseline Vision Assessment?: No apparent visual deficits                Pertinent Vitals/Pain Pain Assessment Pain Assessment: No/denies pain     Hand Dominance Right   Extremity/Trunk Assessment Upper Extremity Assessment Upper Extremity Assessment: Generalized weakness (B UE weakness with A/ROM limited to ~75% of available range for shoulder flexion. WFL P/ROM of shoulder flexion.)   Lower Extremity Assessment Lower Extremity Assessment: Defer to PT evaluation   Cervical / Trunk Assessment Cervical / Trunk Assessment: Kyphotic   Communication Communication Communication: No difficulties   Cognition Arousal/Alertness: Awake/alert Behavior During Therapy: WFL for tasks assessed/performed Overall Cognitive Status: History of cognitive impairments - at baseline                                                        Home Living Family/patient expects  to be discharged to:: Private residence Living Arrangements: Spouse/significant other Available Help at Discharge: Family;Available PRN/intermittently Type of Home: House Home Access: Stairs to enter Entrance Stairs-Number of Steps: 1   Home Layout: Two level;Able to live on main level with bedroom/bathroom;Full bath on main level Alternate Level Stairs-Number of Steps: 4 into den Alternate Level Stairs-Rails: Right Bathroom Shower/Tub: Tub/shower unit;Walk-in shower   Bathroom Toilet: Handicapped height (per history) Bathroom Accessibility: Yes   Home Equipment: Conservation officer, nature (2 wheels);Crutches;Cane - single point;Wheelchair - manual;BSC/3in1;Shower seat;Grab bars - tub/shower   Additional Comments: Pt reports having someone who comes to do cleaning at the house.      Prior Functioning/Environment Prior Level of Function : Needs assist       Physical Assist : Mobility (physical);ADLs (physical) Mobility (physical): Gait;Stairs ADLs (physical): IADLs;Bathing;Dressing Mobility Comments: Household mobility with w/c, propels himself, patient reports he does not use an AD or get assistance for transfer to w/c. (Per chart review.) ADLs Comments: Today pt reports that he does not get assistance for ADL's, but pt is known to be a poor historian.        OT Problem List: Decreased strength;Decreased range of motion;Decreased activity tolerance;Impaired balance (sitting and/or standing)      OT Treatment/Interventions: Self-care/ADL training;Therapeutic exercise;Therapeutic activities;Patient/family education;Balance training  OT Goals(Current goals can be found in the care plan section) Acute Rehab OT Goals Patient Stated Goal: return home OT Goal Formulation: With patient Time For Goal Achievement: 01/07/22 Potential to Achieve Goals: Fair  OT Frequency: Min 2X/week                                   End of Session Equipment Utilized During Treatment: Rolling  walker (2 wheels)  Activity Tolerance: Patient tolerated treatment well Patient left: in chair;with call bell/phone within reach;with chair alarm set  OT Visit Diagnosis: Unsteadiness on feet (R26.81);Other abnormalities of gait and mobility (R26.89);Muscle weakness (generalized) (M62.81)                Time: 6160-7371 OT Time Calculation (min): 15 min Charges:  OT General Charges $OT Visit: 1 Visit OT Evaluation $OT Eval Low Complexity: 1 Low  Tesia Lybrand OT, MOT  Larey Seat 12/24/2021, 11:04 AM

## 2021-12-24 NOTE — TOC Progression Note (Signed)
Transition of Care (TOC) - Progression Note    Patient Details  Name: Danny [name]. MRN: [id number] Date of Birth: 1931-10-08  [facility] CM/SW Contact  Ihor Gully, LCSW Phone Number: 12/24/2021, 4:29 PM  Clinical Narrative:    Met with patient, spouse, and niece. They are not interested in SNF. Discussed that hospice involvement and hospice ordered delivery of DME. They are interested in private duty caregivers to assist with patient's care mornings and evenings for a few hours. Patient referred to both [place] and [facility] home [facility] for private duty sitter options.     Expected Discharge Plan: Home w Hospice Care Barriers to Discharge: Continued Medical Work up  Expected Discharge Plan and Services Expected Discharge Plan: [place] Determinants of Health (SDOH) Interventions    Readmission Risk Interventions    04/30/2021    2:35 PM  Readmission Risk Prevention Plan  Post Dischage Appt Complete  Medication Screening Complete  Transportation Screening Complete

## 2021-12-24 NOTE — Progress Notes (Signed)
Daily Progress Note   Patient Name: Danny [name].       Date: 12/24/2021 DOB: [birth year]  Age: 88 y.o. MRN#: [id number] Attending Physician: Orson Eva, MD Primary Care Physician: Baruch Gouty, FNP Admit Date: 12/22/2021  Reason for Consultation/Follow-up: Establishing goals of care  Patient Profile/HPI:  88 y.o. male  with past medical history of dementia, CAD s/p CABG 2003, a-fib not on anticoag d/t falls, PAD s/p recent stent placement d/t critical limb ischemia, HTN, HLD  admitted on 12/22/2021 with increasing SOB, weakness. Workup revealed acute on chronic heart failure exacerbation- echo this admission showing aortic valve stenosis and worsening EF at 30-35%. Not a candidate for further therapeutic interventions. He is diuresing. Required intermittent bipap- off today. Hypotension this morning- pressor decision pending [place] discussion. Palliative consulted for goals of care.     Subjective: Chart reviewed including labs, progress notes, imaging from this and previous encounters.  Patient sitting up at bedside, eating, smiling, interacting with his wife and niece.  [name] had questions about communications with hospice she received a call but was unable to return the call. [name] and [name] are very pleased with [name] current state.  They are eager to get him home.    Physical Exam Vitals and nursing note reviewed.  Constitutional:      Comments: Frail  Pulmonary:     Effort: Pulmonary effort is normal.  Skin:    General: Skin is warm and dry.  Neurological:     Mental Status: He is alert.  Psychiatric:     Comments: Pleasantly confused             Vital Signs: BP 127/84   Pulse (!) 119   Temp 97.8 F (36.6 C)   Resp 19   Ht '6\' 2"'$  (1.88 m)   Wt 76.4 kg   SpO2 98%    BMI 21.63 kg/m  SpO2: SpO2: 98 % O2 Device: O2 Device: Room Air O2 Flow Rate: O2 Flow Rate (L/min): 2 L/min  Intake/output summary:  Intake/Output Summary (Last 24 hours) at 12/24/2021 1255 Last data filed at 12/24/2021 0741 Gross per 24 hour  Intake 630.02 ml  Output 1750 ml  Net -1119.98 ml   LBM:   Baseline Weight: Weight: 79.3 kg Most recent weight: Weight: 76.4 kg  Palliative Assessment/Data: PPS: 40%      Patient Active Problem List   Diagnosis Date Noted   Cellulitis and abscess of right leg 12/23/2021   Acute on chronic systolic CHF (congestive heart failure) ([place]) 12/22/2021   Acute respiratory failure with hypoxia and hypercarbia ([facility]) 12/22/2021   Hx of CABG 10/27/2021   Mixed hyperlipidemia 09/29/2021   Major neurocognitive disorder ([place]) 05/25/2021   Edema of both lower extremities 04/30/2020   Gastroesophageal reflux disease 04/30/2020   Pulmonary HTN ([place]) 02/11/2020   Persistent atrial fibrillation ([place]) 10/14/2019   Vitamin D deficiency    Difficulty sleeping 10/02/2018   Paroxysmal atrial fibrillation ([place]) 08/01/2016   Thoracic aorta atherosclerosis ([place]) 01/14/2014   Hyperplasia of prostate 05/18/2010   Carotid stenosis 05/18/2010   Essential hypertension 05/19/2008   SUBCLAVIAN STEAL SYNDROME 05/19/2008    Palliative Care Assessment & Plan    Assessment/Recommendations/Plan  Continue current plan of care Appreciate TOC assisting with coordination of hospice services at home Patient and family are hopeful for discharge this weekend   Code Status: DNR  Prognosis:  < 6 months  Discharge Planning: Home with Hospice  Care plan was discussed with patient and care team  Thank you for allowing the Palliative Medicine Team to assist in the care of this patient.  Total time: 45 minutes Prolonged billing:      Greater than 50%  of this time was spent counseling and coordinating care related to the above assessment and  plan.  Mariana Kaufman, AGNP-C Palliative Medicine   Please contact Palliative Medicine Team phone at [phone] for questions and concerns.

## 2021-12-24 NOTE — Evaluation (Addendum)
Physical Therapy Evaluation Patient Details Name: Danny [name]. MRN: [id number] DOB: [birth year] Today's Date: 12/24/2021  History of Present Illness  Danny [name]. is 88 year old with a history of dementia, persistent atrial fibrillation, coronary artery disease, hypertension, hyperlipidemia, pulmonary hypertension, aortic stenosis, peripheral vascular disease presenting with 2 to 3-day history of progressive generalized weakness and shortness of breath. At baseline, the patient is normally able to initiate transfer to his wheelchair with which he normally gets around. However in the last 24 hours he has not been able to get out of bed. He has increasing shortness of breath, increasing lower extremity edema and increasing abdominal girth. Patient himself is a poor historian. History is supplemented from the patient's niece at the bedside. He is in the states that he recently had a stent in his left SFA on 12/13/2021. She states that "he has gone downhill since then". She also states that she has noted worsening cognitive and functional decline over the last 10 months.   Clinical Impression  Patient able to sit up at bedside with min/hand held assist to elevate trunk and move legs off EOB, patient able to scoot to EOB w/o assist with verbal cueing. Patient able to stand with mod assist/RW, and transfer to chair with RW/mod-max assist due to unsteadiness on feet, generalized weakness and falling back into chair with knees buckling once near chair. Patient tolerated sitting up in chair after therapy. Patient will benefit from continued skilled physical therapy in hospital and recommended venue below to increase strength, balance, endurance for safe ADLs and gait.      Recommendations for follow up therapy are one component of a multi-disciplinary discharge planning process, led by the attending physician.  Recommendations may be updated based on patient status, additional functional criteria and  insurance authorization.  Follow Up Recommendations Skilled nursing-short term rehab (<3 hours/day) Can patient physically be transported by private vehicle: No    Assistance Recommended at Discharge Intermittent Supervision/Assistance  Patient can return home with the following  A lot of help with bathing/dressing/bathroom;Help with stairs or ramp for entrance;Assistance with cooking/housework;A lot of help with walking and/or transfers    Equipment Recommendations None recommended by PT  Recommendations for Other Services       Functional Status Assessment Patient has had a recent decline in their functional status and demonstrates the ability to make significant improvements in function in a reasonable and predictable amount of time.     Precautions / Restrictions Precautions Precautions: Fall Restrictions Weight Bearing Restrictions: No      Mobility  Bed Mobility Overal bed mobility: Needs Assistance Bed Mobility: Supine to Sit     Supine to sit: Min assist     General bed mobility comments: min/hand held assist to elevate trunk and move legs off EOB, patient able to scoot to EOB with verbal cueing    Transfers Overall transfer level: Needs assistance Equipment used: Rolling walker (2 wheels) Transfers: Sit to/from Stand, Bed to chair/wheelchair/BSC Sit to Stand: Mod assist   Step pivot transfers: Mod assist, Max assist       General transfer comment: patient unable to stand and transfer to chair without AD, able to do so with RW/mod-max assist although very unsteady on feet and falling back into chair w/ knees buckling once nearby demonstrating poor standing balance/control and weakness    Ambulation/Gait Ambulation/Gait assistance: Mod assist, Max assist Gait Distance (Feet): 2 Feet Assistive device: Rolling walker (2 wheels) Gait Pattern/deviations: Decreased step length -  right, Decreased step length - left, Decreased stride length, Knees buckling Gait  velocity: slow     General Gait Details: patient limited to a few labored and unsteady side steps at bedside with RW/mod-max assist, limited due to generalized weakness and fatigue  Stairs            Wheelchair Mobility    Modified Rankin (Stroke Patients Only)       Balance Overall balance assessment: Needs assistance Sitting-balance support: Feet supported, Bilateral upper extremity supported Sitting balance-Leahy Scale: Good Sitting balance - Comments: seated EOB   Standing balance support: During functional activity, Reliant on assistive device for balance, Bilateral upper extremity supported Standing balance-Leahy Scale: Poor Standing balance comment: with RW                             Pertinent Vitals/Pain Pain Assessment Pain Assessment: No/denies pain    Home Living Family/patient expects to be discharged to:: Private residence Living Arrangements: Spouse/significant other Available Help at Discharge: Family;Available PRN/intermittently Type of Home: House Home Access: Stairs to enter   Entrance Stairs-Number of Steps: 1 Alternate Level Stairs-Number of Steps: 4 into [employer]: Two level;Able to live on main level with bedroom/bathroom;Full bath on main level Home Equipment: Rolling Walker (2 wheels);Crutches;Cane - single point;Wheelchair - manual;BSC/3in1;Shower seat;Grab bars - tub/shower      Prior Function Prior Level of Function : Needs assist       Physical Assist : Mobility (physical);ADLs (physical) Mobility (physical): Gait;Stairs ADLs (physical): IADLs;Bathing;Dressing Mobility Comments: Sales executive with w/c, propels himself, patient reports he does not use an AD or get assistance for transfer to w/c ADLs Comments: Patient unclear about ADL assistance, reports his wife provides limited assitance with ADLs like bathing/dressing and iADLs     Hand Dominance        Extremity/Trunk Assessment   Upper Extremity  Assessment Upper Extremity Assessment: Generalized weakness    Lower Extremity Assessment Lower Extremity Assessment: Generalized weakness    Cervical / Trunk Assessment Cervical / Trunk Assessment: Kyphotic  Communication   Communication: No difficulties  Cognition Arousal/Alertness: Awake/alert Behavior During Therapy: WFL for tasks assessed/performed Overall Cognitive Status: Within Functional Limits for tasks assessed                                          General Comments      Exercises     Assessment/Plan    PT Assessment Patient needs continued PT services  PT Problem List Decreased strength;Decreased activity tolerance;Decreased balance;Decreased mobility       PT Treatment Interventions DME instruction;Balance training;Gait training;Stair training;Functional mobility training;Patient/family education;Therapeutic activities;Therapeutic exercise    PT Goals (Current goals can be found in the Care Plan section)  Acute Rehab PT Goals Patient Stated Goal: return home with family to assist PT Goal Formulation: With patient Time For Goal Achievement: 01/07/22 Potential to Achieve Goals: Good    Frequency Min 3X/week     Co-evaluation               AM-PAC PT "6 Clicks" Mobility  Outcome Measure Help needed turning from your back to your side while in a flat bed without using bedrails?: A Little Help needed moving from lying on your back to sitting on the side of a flat bed without using bedrails?: A Little Help needed  moving to and from a bed to a chair (including a wheelchair)?: A Lot Help needed standing up from a chair using your arms (e.g., wheelchair or bedside chair)?: A Lot Help needed to walk in hospital room?: A Lot Help needed climbing 3-5 steps with a railing? : Total 6 Click Score: 13    End of Session   Activity Tolerance: Patient tolerated treatment well;Patient limited by fatigue Patient left: in chair;with call  bell/phone within reach;with chair alarm set Nurse Communication: Mobility status PT Visit Diagnosis: Unsteadiness on feet (R26.81);Other abnormalities of gait and mobility (R26.89);Muscle weakness (generalized) (M62.81)    Time: 8003-4917 PT Time Calculation (min) (ACUTE ONLY): 27 min   Charges:   PT Evaluation $PT Eval Moderate Complexity: 1 Mod PT Treatments $Therapeutic Activity: 23-37 mins        Zigmund Gottron, SPT

## 2021-12-24 NOTE — Progress Notes (Signed)
Speech Language Pathology Treatment: Dysphagia  Patient Details Name: Danny [name]. MRN: [id number] DOB: 24-Sep-1931 Today's Date: 12/24/2021 Time: 4128-7867 SLP Time Calculation (min) (ACUTE ONLY): 19 min  Assessment / Plan / Recommendation Clinical Impression  Ongoing dysphagia treatment provided. Pt was very alert, pleasant and engaged for treatment today. Pt consumed D2/fine chop tray provided and thin liquids with occasional delayed throat clearing. SLP reinforced strategies recommended and universal aspiration precautions. Note decreased throat clearing with single sips. As pt's family prefers Pt to have what he wants and ST recommendation from BSE is regular - will upgrade to regular at this time and will sign off. Please re- consult if needed. Thank you,   HPI HPI: 88 y.o. male  with past medical history of dementia, CAD s/p CABG 2003, a-fib not on anticoag d/t falls, PAD s/p recent stent placement d/t critical limb ischemia, HTN, HLD  admitted on 12/22/2021 with increasing SOB, weakness. Workup revealed acute on chronic heart failure exacerbation- echo this admission showing aortic valve stenosis and worsening EF at 30-35%. Not a candidate for further therapeutic interventions. He is diuresing. Required intermittent bipap- off today. Hypotension this morning- pressor decision pending [place] discussion. BSE requested.      SLP Plan  Continue with current plan of care      Recommendations for follow up therapy are one component of a multi-disciplinary discharge planning process, led by the attending physician.  Recommendations may be updated based on patient status, additional functional criteria and insurance authorization.    Recommendations  Diet recommendations: Regular;Thin liquid Liquids provided via: Cup;Straw Medication Administration: Whole meds with liquid Compensations: Slow rate;Small sips/bites                Oral Care Recommendations: Oral care BID;Staff/trained  caregiver to provide oral care Follow Up Recommendations: No SLP follow up SLP Visit Diagnosis: Dysphagia, unspecified (R13.10) Plan: Continue with current plan of care         Page Pucciarelli H. Roddie Mc, CCC-SLP Speech Language Pathologist   Wende Bushy  12/24/2021, 7:54 AM

## 2021-12-24 NOTE — Plan of Care (Signed)
  Problem: Acute Rehab OT Goals (only OT should resolve) Goal: Pt. Will Perform Grooming Flowsheets (Taken 12/24/2021 1104) Pt Will Perform Grooming:  with min assist  standing Goal: Pt. Will Perform Lower Body Bathing Flowsheets (Taken 12/24/2021 1104) Pt Will Perform Lower Body Bathing:  with modified independence  sitting/lateral leans Goal: Pt. Will Perform Upper Body Dressing Flowsheets (Taken 12/24/2021 1104) Pt Will Perform Upper Body Dressing:  with modified independence  sitting Goal: Pt. Will Perform Lower Body Dressing Flowsheets (Taken 12/24/2021 1104) Pt Will Perform Lower Body Dressing:  with modified independence  sitting/lateral leans Goal: Pt. Will Transfer To Toilet Flowsheets (Taken 12/24/2021 1104) Pt Will Transfer to Toilet:  with min guard assist  squat pivot transfer Goal: Pt. Will Perform Toileting-Clothing Manipulation Flowsheets (Taken 12/24/2021 1104) Pt Will Perform Toileting - Clothing Manipulation and hygiene:  with modified independence  sitting/lateral leans Goal: Pt/Caregiver Will Perform Home Exercise Program Flowsheets (Taken 12/24/2021 1104) Pt/caregiver will Perform Home Exercise Program:  Increased ROM  Increased strength  Both right and left upper extremity  Independently  Sury Wentworth OT, MOT

## 2021-12-24 NOTE — TOC Progression Note (Signed)
Transition of Care (TOC) - Progression Note    Patient Details  Name: Danny [name]. MRN: [id number] Date of Birth: [birth year]  Transition of Care [facility]) CM/SW Contact  Ihor Gully, LCSW Phone Number: 12/24/2021, 1:24 PM  Clinical Narrative:    Marchia Meiers with [facility] advises that they have ordered patient's equipment from [facility] and it will be delivered today in preparation for patient's discharge tomorrow.    Expected Discharge Plan: Home w Hospice Care Barriers to Discharge: Continued Medical Work up  Expected Discharge Plan and Services Expected Discharge Plan: [place] Determinants of Health (SDOH) Interventions    Readmission Risk Interventions    04/30/2021    2:35 PM  Readmission Risk Prevention Plan  Post Dischage Appt Complete  Medication Screening Complete  Transportation Screening Complete

## 2021-12-24 NOTE — Progress Notes (Signed)
   Discussed with Dr. Dellia Cloud and interval notes from [place] reviewed. No plans for aggressive therapy at this time. Plans are to optimize medically then Home with Hospice. He is responding well to IV Lasix '40mg'$  BID with a net output of -4.8 L thus far and creatinine stable at 1.12. K+ low at 3.3 (will order replacement). Continue with IV diuresis until euvolemic. He was on Lasix '20mg'$  daily prior to admission and will likely need '40mg'$  daily at the time of discharge. Additional GDMT has been limited due to hypotension. No new Cardiology recommendations at this time.   Signed, Erma Heritage, PA-C 12/24/2021, 10:17 AM

## 2021-12-24 NOTE — Progress Notes (Addendum)
PROGRESS NOTE  Danny [name]. INO:[id number] DOB: 12-Jan-1932 DOA: 12/22/2021 PCP: Baruch Gouty, FNP  Brief History:  88 year old with a history of dementia, persistent atrial fibrillation, coronary artery disease, hypertension, hyperlipidemia, pulmonary hypertension, aortic stenosis, peripheral vascular disease presenting with 2 to 3-day history of progressive generalized weakness and shortness of breath.  At baseline, the patient is normally able to initiate transfer to his wheelchair with which he normally gets around.  However in the last 24 hours he has not been able to go to bed.  He is also had increasing shortness of breath, increasing lower extremity edema and increasing abdominal girth.  Patient himself is a poor historian.  History is supplemented from the patient's niece at the bedside.  He is in the states that he recently had a stent in his left SFA on 12/13/2021.  She states that "he has gone downhill since then".  She also states that she has noted worsening cognitive and functional decline over the last 10 months. There is been no fevers, chills, chest pain, nausea, vomiting, hemoptysis.  He has had some loose stools without hematochezia or melena. In the ED, the patient was afebrile and hemodynamically stable.  He was noted to have oxygen saturation of 75% room air.  He was subsequent placed on BiPAP.  He was given furosemide 40 mg IV x 1 and started on vancomycin and cefepime.  Chest x-ray showed bilateral pleural effusion, right greater than left.  There is left lower lobe infiltrate vs atelectasis.  WBC 7.1, hemoglobin 14.7, platelets 154,000.  BNP was 1048.  Lactate 3.2.  INR 1.5.   Assessment/Plan: Acute respiratory failure with hypoxia and hypercarbia -Secondary to pleural effusion and CHF -Initially on BiPAP -now stable on 2L>>weaned to RA   Acute on chronic systolic CHF -Biventricular failure -Continue IV furosemide 40 mg twice daily -remains fluid  overloaded -Daily weights -Accurate I's and O's--Neg 4.9L -02/06/2021 echo EF 40-45%, severe TR, moderate AS, RV overload -12/12/21 Echo EF 30-35%, severe RV dysfxn, severe TR -appreciate cardiology   Peripheral vascular disease -Status post left SFA stent 12/13/2021 -Continue aspirin   Cellulitis right leg -started cefazolin   Persistent atrial fibrillation -no longer on warfarin due to frailty and falls -continue metoprolol succinate at lower dose due to soft BPs   Essential HTN -BPs soft with diuresis   Severe Aortic Stenosis -he is frail and not a candidate for intervention.    Major neurocognitive disorder -at risk for hospital delirium -not on therapy outpatient   Coronary Artery Disease -not a candidate for aggressive intervention -1/18.23 cath---Left main ostial occlusion; Nonobstructive disease in the RCA; patent grafts  -no chest pain presently -continue ASA  Hypokalemia -replete -check mag   GOC -appreciate palliative consult -plan to optimize as much as possible and d/c home with hospice care if spouse can provide care and/or care can be supplemented -if extra help cannot be arranged for home, may need to go to SNF             Family Communication:   spouse and niece at bedside 12/1   Consultants:  cardiology/palliative   Code Status:  DNR   DVT Prophylaxis:  [place] Heparin      Procedures: As Listed in Progress Note Above   Antibiotics: None              Subjective: Patient denies fevers, chills, headache, chest pain, dyspnea, nausea, vomiting, diarrhea, abdominal  pain, dysuria, hematuria, hematochezia, and melena.   Objective: Vitals:   12/24/21 1000 12/24/21 1100 12/24/21 1200 12/24/21 1300  BP: (!) 117/57 115/70 127/84 121/74  Pulse: 68 (!) 101 (!) 119 (!) 102  Resp: '13 20 19   '$ Temp:      TempSrc:      SpO2: 96% 98% 98% 100%  Weight:      Height:        Intake/Output Summary (Last 24 hours) at 12/24/2021 1343 Last  data filed at 12/24/2021 0741 Gross per 24 hour  Intake 390.02 ml  Output 1750 ml  Net -1359.98 ml   Weight change: -2.9 kg Exam:  General:  Pt is alert, follows commands appropriately, not in acute distress HEENT: No icterus, No thrush, No neck mass, [place]/AT Cardiovascular: IRRR, S1/S2, no rubs, no gallops Respiratory: bibasilar crackles. No wheeze Abdomen: Soft/+BS, non tender, non distended, no guarding Extremities: trace LE edema, No lymphangitis, No petechiae, No rashes, no synovitis   Data Reviewed: I have personally reviewed following labs and imaging studies Basic Metabolic Panel: Recent Labs  Lab 12/22/21 1259 12/22/21 1302 12/23/21 0600 12/24/21 0300  NA 137 137 137 136  K 5.3 5.3* 4.1 3.3*  CL 102 100 100 95*  CO2 '21 26 27 30  '$ GLUCOSE 80 86 85 74  BUN 34 36* 37* 31*  CREATININE 0.97 1.09 1.20 1.12  CALCIUM 8.2 8.5* 8.2* 8.1*  MG  --   --  2.2  --    Liver Function Tests: Recent Labs  Lab 12/22/21 1302  AST 29  ALT 26  ALKPHOS 130*  BILITOT 2.1*  PROT 6.1*  ALBUMIN 2.7*   No results for input(s): "LIPASE", "AMYLASE" in the last 168 hours. Recent Labs  Lab 12/23/21 0600  AMMONIA 34   Coagulation Profile: Recent Labs  Lab 12/22/21 0920  INR 1.5*   CBC: Recent Labs  Lab 12/22/21 0920 12/23/21 0600 12/24/21 0300  WBC 7.1 9.2 8.4  NEUTROABS 5.7  --   --   HGB 14.7 12.1* 12.2*  HCT 44.1 36.6* 37.1*  MCV 96.3 95.3 95.1  PLT 154 200 200   Cardiac Enzymes: No results for input(s): "CKTOTAL", "CKMB", "CKMBINDEX", "TROPONINI" in the last 168 hours. BNP: Invalid input(s): "POCBNP" CBG: Recent Labs  Lab 12/22/21 1929 12/22/21 1957 12/22/21 2313 12/23/21 0305 12/23/21 0613  GLUCAP 62* 103* 84 73 82   HbA1C: No results for input(s): "HGBA1C" in the last 72 hours. Urine analysis:    Component Value Date/Time   COLORURINE YELLOW 12/22/2021 1241   APPEARANCEUR CLEAR 12/22/2021 1241   APPEARANCEUR Clear 05/05/2017 1346   LABSPEC 1.009  12/22/2021 1241   PHURINE 5.0 12/22/2021 1241   GLUCOSEU NEGATIVE 12/22/2021 1241   HGBUR NEGATIVE 12/22/2021 1241   BILIRUBINUR NEGATIVE 12/22/2021 1241   BILIRUBINUR Negative 05/05/2017 1346   KETONESUR NEGATIVE 12/22/2021 1241   PROTEINUR NEGATIVE 12/22/2021 1241   UROBILINOGEN negative 02/11/2015 1142   UROBILINOGEN 0.2 09/19/2012 2034   NITRITE NEGATIVE 12/22/2021 1241   LEUKOCYTESUR NEGATIVE 12/22/2021 1241   Sepsis Labs: '@LABRCNTIP'$ (procalcitonin:4,lacticidven:4) ) Recent Results (from the past 240 hour(s))  Resp Panel by RT-PCR (Flu A&B, Covid) Anterior Nasal Swab     Status: None   Collection Time: 12/22/21  9:21 AM   Specimen: Anterior Nasal Swab  Result Value Ref Range Status   SARS Coronavirus 2 by RT PCR NEGATIVE NEGATIVE Final    Comment: (NOTE) SARS-CoV-2 target nucleic acids are NOT DETECTED.  The SARS-CoV-2 RNA is generally  detectable in upper respiratory specimens during the acute phase of infection. The lowest concentration of SARS-CoV-2 viral copies this assay can detect is 138 copies/mL. A negative result does not preclude SARS-Cov-2 infection and should not be used as the sole basis for treatment or other patient management decisions. A negative result may occur with  improper specimen collection/handling, submission of specimen other than nasopharyngeal swab, presence of viral mutation(s) within the areas targeted by this assay, and inadequate number of viral copies(<138 copies/mL). A negative result must be combined with clinical observations, patient history, and epidemiological information. The expected result is Negative.  Fact Sheet for Patients:  EntrepreneurPulse.com.au  Fact Sheet for Healthcare Providers:  IncredibleEmployment.be  This test is no t yet approved or cleared by the [place] FDA and  has been authorized for detection and/or diagnosis of SARS-CoV-2 by FDA under an Emergency Use  Authorization (EUA). This EUA will remain  in effect (meaning this test can be used) for the duration of the COVID-19 declaration under Section 564(b)(1) of the Act, 21 U.S.C.section 360bbb-3(b)(1), unless the authorization is terminated  or revoked sooner.       Influenza A by PCR NEGATIVE NEGATIVE Final   Influenza B by PCR NEGATIVE NEGATIVE Final    Comment: (NOTE) The Xpert Xpress SARS-CoV-2/FLU/RSV plus assay is intended as an aid in the diagnosis of influenza from Nasopharyngeal swab specimens and should not be used as a sole basis for treatment. Nasal washings and aspirates are unacceptable for Xpert Xpress SARS-CoV-2/FLU/RSV testing.  Fact Sheet for Patients: EntrepreneurPulse.com.au  Fact Sheet for Healthcare Providers: IncredibleEmployment.be  This test is not yet approved or cleared by the [place] FDA and has been authorized for detection and/or diagnosis of SARS-CoV-2 by FDA under an Emergency Use Authorization (EUA). This EUA will remain in effect (meaning this test can be used) for the duration of the COVID-19 declaration under Section 564(b)(1) of the Act, 21 U.S.C. section 360bbb-3(b)(1), unless the authorization is terminated or revoked.  Performed at [facility], [address], [place], [place] [address]   Culture, blood (routine x 2)     Status: None (Preliminary result)   Collection Time: 12/22/21 10:39 AM   Specimen: BLOOD  Result Value Ref Range Status   Specimen Description BLOOD BLOOD RIGHT HAND  Final   Special Requests NONE  Final   Culture   Final    NO GROWTH 1 DAY Performed at [facility], [address], [place], [place] [address]    Report Status PENDING  Incomplete  Culture, blood (routine x 2)     Status: None (Preliminary result)   Collection Time: 12/22/21 10:39 AM   Specimen: BLOOD  Result Value Ref Range Status   Specimen Description BLOOD BLOOD LEFT HAND  Final   Special Requests NONE   Final   Culture   Final    NO GROWTH 1 DAY Performed at [facility], [address] [place], [place] [address]    Report Status PENDING  Incomplete  MRSA Next Gen by PCR, Nasal     Status: None   Collection Time: 12/22/21 12:53 PM   Specimen: Nasal Mucosa; Nasal Swab  Result Value Ref Range Status   MRSA by PCR Next Gen NOT DETECTED NOT DETECTED Final    Comment: (NOTE) The GeneXpert MRSA Assay (FDA approved for NASAL specimens only), is one component of a comprehensive MRSA colonization surveillance program. It is not intended to diagnose MRSA infection nor to guide or monitor treatment for  MRSA infections. Test performance is not FDA approved in patients less than 73 years old. Performed at [facility], [address], [place], [place] [address]      Scheduled Meds:  aspirin  81 mg Oral Daily   Chlorhexidine Gluconate Cloth  6 each Topical Q0600   furosemide  40 mg Intravenous BID   heparin  5,000 Units Subcutaneous Q8H   mouth rinse  15 mL Mouth Rinse 4 times per day   sodium chloride flush  3 mL Intravenous Q12H   Continuous Infusions:  sodium chloride      ceFAZolin (ANCEF) IV 1 g (12/24/21 1244)    Procedures/Studies: DG CHEST PORT 1 VIEW  Result Date: 12/23/2021 CLINICAL DATA:  [id number] with bilateral pleural effusions and systolic CHF, respiratory distress. EXAM: PORTABLE CHEST 1 VIEW COMPARISON:  Portable chest yesterday at 9:44 a.m. FINDINGS: 4:51 a.m. CABG changes, cardiomegaly and a heavily calcified thoracic aorta are again noted. There is central vascular prominence which appears similar, large right and moderate left pleural effusions and either a mild improvement in the right effusion or redistribution. Overlying atelectasis or airspace disease on the right-greater-than-left appear similar on the left but improved on the right, with the right lower lung field still obscured by the fluid. The lung fields are otherwise clear. In all other respects there are no  further changes. IMPRESSION: 1. Mild improvement in the right pleural effusion versus fluid redistribution. 2. Cardiomegaly with central vascular congestion and right-greater-than-left pleural effusions with overlying atelectasis or airspace disease. 3. Aortic atherosclerosis. Electronically Signed   By: Telford Nab M.D.   On: 12/23/2021 06:09   ECHOCARDIOGRAM COMPLETE  Result Date: 12/22/2021    ECHOCARDIOGRAM REPORT   Patient Name:   Chidubem Chaires. Date of Exam: 12/22/2021 Medical Rec #:  [id number]         Height:       74.0 in Accession #:    [id number]        Weight:       174.8 lb Date of Birth:  03-05-1931         BSA:          2.053 m Patient Age:    54 years          BP:           122/79 mmHg Patient Gender: M                 HR:           84 bpm. Exam Location:  [facility] Procedure: 2D Echo, Cardiac Doppler and Color Doppler Indications:    CHF-Acute Systolic X38.18  History:        Patient has prior history of Echocardiogram examinations, most                 recent 02/06/2021. CAD, Prior CABG, Arrythmias:Atrial                 Fibrillation; Risk Factors:Hypertension, Dyslipidemia and Former                 Smoker.  Sonographer:    Alvino Chapel RCS Referring Phys: [phone] Jerman Tinnon IMPRESSIONS  1. Left ventricular ejection fraction, by estimation, is 30 to 35%. The left ventricle has moderately decreased function. The left ventricle has no regional wall motion abnormalities. Left ventricular diastolic function could not be evaluated. There is the interventricular septum is flattened in systole and diastole, consistent with right ventricular pressure and  volume overload. RV volume overload causing low LV filling output.  2. Severe right ventricular enlargement. Severe right ventricular systolic dysfunction.  3. Left atrial size was severely dilated.  4. Right atrial size was severely dilated.  5. The mitral valve is abnormal. Trivial mitral valve regurgitation. No evidence of mitral stenosis.  6.  Tricuspid valve regurgitation is severe. Unable to calculate pulmonary artery systolic pressure due to rapid pressure equilibration between RV and RA from severe TR.  7. The aortic valve is abnormal. There is severe calcifcation of the aortic valve. Aortic valve regurgitation is not visualized. There is low flow-low gradient severe aortic valve stenosis with V max 3.12 m/s, mean PG 24 mm Hg, Ao valve area by continuity equation 0.35 cm2 and SVI 12.7 ml/m2. Cardiology consult is recommended. Comparison(s): Changes from prior study are noted. LVEF decreased to 30-35%, RV function severely reduced and low flow-low gradient severe aortic valve stenosis now. FINDINGS  Left Ventricle: Left ventricular ejection fraction, by estimation, is 30 to 35%. The left ventricle has moderately decreased function. The left ventricle has no regional wall motion abnormalities. Definity contrast agent was given IV to delineate the left ventricular endocardial borders. The left ventricular internal cavity size was normal in size. There is no left ventricular hypertrophy. The interventricular septum is flattened in systole and diastole, consistent with right ventricular pressure and  volume overload. Left ventricular diastolic function could not be evaluated due to nondiagnostic images. Left ventricular diastolic function could not be evaluated. Right Ventricle: Unable to calculate PASP due to rapid pressure equilibration between RV and RA due to severe TR. The right ventricular size is severely enlarged. Right vetricular wall thickness was not well visualized. Right ventricular systolic function is severely reduced. Left Atrium: Left atrial size was severely dilated. Right Atrium: Right atrial size was severely dilated. Pericardium: There is no evidence of pericardial effusion. Mitral Valve: The mitral valve is abnormal. There is moderate calcification of the mitral valve leaflet(s). Trivial mitral valve regurgitation. No evidence of  mitral valve stenosis. Tricuspid Valve: The tricuspid valve is abnormal. Tricuspid valve regurgitation is severe. No evidence of tricuspid stenosis. Aortic Valve: The aortic valve is abnormal. There is severe calcifcation of the aortic valve. There is moderate aortic valve annular calcification. Aortic valve regurgitation is not visualized. Severe aortic stenosis is present. Aortic valve mean gradient measures 23.0 mmHg. Aortic valve peak gradient measures 36.8 mmHg. Aortic valve area, by VTI measures 0.35 cm. Pulmonic Valve: The pulmonic valve was not well visualized. Pulmonic valve regurgitation is not visualized. No evidence of pulmonic stenosis. Aorta: The aortic root is normal in size and structure. Venous: IVC assessment for right atrial pressure unable to be performed due to mechanical ventilation. IAS/Shunts: No atrial level shunt detected by color flow Doppler.  LEFT VENTRICLE PLAX 2D LVIDd:         3.70 cm   Diastology LVIDs:         3.10 cm   LV e' medial:    5.44 cm/s LV PW:         1.20 cm   LV E/e' medial:  24.8 LV IVS:        1.00 cm   LV e' lateral:   7.07 cm/s LVOT diam:     1.90 cm   LV E/e' lateral: 19.1 LV SV:         26 LV SV Index:   13 LVOT Area:     2.84 cm  RIGHT VENTRICLE RV S prime:  6.90 cm/s TAPSE (M-mode): 1.3 cm LEFT ATRIUM              Index        RIGHT ATRIUM           Index LA diam:        4.70 cm  2.29 cm/m   RA Area:     40.70 cm LA Vol (A2C):   91.3 ml  44.48 ml/m  RA Volume:   197.00 ml 95.97 ml/m LA Vol (A4C):   133.0 ml 64.79 ml/m LA Biplane Vol: 113.0 ml 55.05 ml/m  AORTIC VALVE AV Area (Vmax):    0.43 cm AV Area (Vmean):   0.44 cm AV Area (VTI):     0.35 cm AV Vmax:           303.50 cm/s AV Vmean:          224.000 cm/s AV VTI:            0.749 m AV Peak Grad:      36.8 mmHg AV Mean Grad:      23.0 mmHg LVOT Vmax:         45.50 cm/s LVOT Vmean:        34.600 cm/s LVOT VTI:          0.092 m LVOT/AV VTI ratio: 0.12  AORTA Ao Root diam: 3.30 cm MITRAL VALVE                 TRICUSPID VALVE MV Area (PHT): 5.02 cm     TR Peak grad:   37.2 mmHg MV Decel Time: 151 msec     TR Vmax:        305.00 cm/s MR Peak grad: 32.0 mmHg MR Mean grad: 20.0 mmHg     SHUNTS MR Vmax:      283.00 cm/s   Systemic VTI:  0.09 m MR Vmean:     209.5 cm/s    Systemic Diam: 1.90 cm MV E velocity: 135.00 cm/s Vishnu Priya Mallipeddi Electronically signed by Lorelee Cover Mallipeddi Signature Date/Time: 12/22/2021/8:30:56 PM    Final    DG Chest Port 1 View  Result Date: 12/22/2021 CLINICAL DATA:  Weakness, respiratory distress EXAM: PORTABLE CHEST 1 VIEW COMPARISON:  02/08/2021 FINDINGS: Large right pleural effusion, increased since prior study. Moderate left pleural effusion, also increased. Increasing airspace disease throughout the right lung and in the left lower lobe. Heart is normal size. Prior CABG. IMPRESSION: Large right pleural effusion and moderate left pleural effusion. Airspace disease throughout the right lung and left lower lobe could reflect atelectasis or infiltrate/pneumonia. Electronically Signed   By: Rolm Baptise M.D.   On: 12/22/2021 09:53   PERIPHERAL VASCULAR CATHETERIZATION  Result Date: 12/13/2021 Images from the original result were not included. Patient name: Celestine Bougie. MRN: [id number] DOB: April 05, 1931 Sex: male 12/13/2021 Pre-operative Diagnosis: Chronic left lower extremity limb threatening ischemia with toe ulceration Post-operative diagnosis:  Same Surgeon:  Erlene Quan C. Donzetta Matters, MD Procedure Performed: 1.  Ultrasound-guided cannulation right common femoral artery 2.  Aortogram and bilateral lower extremity angiography 3.  Stent of left SFA with 6 x 60 mm Elluvia 4.  mynx device closure right common femoral artery Indications: 88 year old male with chronic bilateral lower extremity threatening ischemia with ulceration of the left great toe and right medial ankle severe depressed ABIs.  Motor pressure calculated on the left given the ulceration.  He indicated for  angiography with possible intervention. Findings: Aortoiliac segments are heavily calcified  although there is no flow-limiting stenosis.  Bilateral renal arteries are patent.  Left common femoral artery heavily calcified with approximate 50% stenosis that was not treated.  Left SFA also heavily calcified there is 1 area in the mid segment with 80% stenosis that was stented to less than 20% residual stenosis with heavy calcification.  Distally on the left there is approximately 40% stenosis of the distal SFA proximal popliteal artery and then below the knee there is no visible anterior tibial artery.  The peroneal artery has initial 50% stenosis within the runoff to the ankle.  Posterior tibial artery initially has runoff within appears to occlude for proximally 2 cm in the distal segment and then reconstitutes into the foot and no intervention was undertaken below the knee.  On the right side the patient has heavily calcified common femoral artery at least 50% stenosis and then heavily calcified SFA and popliteal arteries above the knee and are patent appears to be occluded at the below the knee popliteal artery level with reconstitution of posterior tibial artery. Patient to follow-up in a few weeks to evaluate for wound healing and can consider scheduling right lower extremity angiogram at that time.  Procedure:  The patient was identified in the holding area and taken to room 8.  The patient was then placed supine on the table and prepped and draped in the usual sterile fashion.  A time out was called.  Ultrasound was used to evaluate the right common femoral artery which was calcified.  The area was anesthetized with 1% lidocaine cannulated micropuncture needle followed by wire and sheath.  Images saved the permanent record.  We placed a Bentson wire followed by 5 [place] sheath performed aortogram.  We then crossed the bifurcation with Bentson wire and Navi cross catheter for left lower extremity angiogram.   With the above findings we elected for treatment.  We placed a Glidewire advantage followed by a long 6 [place] sheath patient was heparinized with 6000 units of heparin.  We then crossed into the distal popliteal artery performed further angiography below the knee to better define the tibial vessels.  We then primarily stented the SFA with a 6 x 60 mm drug-eluting stent and postdilated this with 5 mm balloon to 0% residual stenosis.  At this time we retracted the sheath into the right external iliac artery and perform right lower extremity angiography which was limited by patient's contracture of his right knee and motion degradation.  We then exchanged for a short 6 French sheath over the Glidewire advantage and then deployed a minx device.  He tolerated the procedure without any complication. Contrast: 90 cc Brandon C. Donzetta Matters, MD [facility] Office: [phone] Pager: [phone]   VAS [place] ABI WITH/WO TBI  Result Date: 12/08/2021  LOWER EXTREMITY DOPPLER STUDY Patient Name:  VIRL COBLE.  Date of Exam:   12/08/2021 Medical Rec #: [id number]          Accession #:    [id number] Date of Birth: 1931/03/17          Patient Gender: M Patient Age:   77 years Exam Location:  [facility] Procedure:      VAS [place] ABI WITH/WO TBI Referring Phys: CHRISTOPHER DICKSON --------------------------------------------------------------------------------  Indications: Ulceration, and peripheral artery disease. High Risk Factors: Hypertension, hyperlipidemia.  Comparison Study: No prior ABI                   12/03/21 US  Arterial lower extremity duplex left- Abnormal                   monophasic arterial waveforms throughout the left lower                   extremity arterial tree. Findings suggest proximal inflow                   (aortoiliac) occlusive disease. Performing Technologist: Maudry Mayhew MHA, RVT, RDCS, RDMS  Examination Guidelines: A complete evaluation includes  at minimum, Doppler waveform signals and systolic blood pressure reading at the level of bilateral brachial, anterior tibial, and posterior tibial arteries, when vessel segments are accessible. Bilateral testing is considered an integral part of a complete examination. Photoelectric Plethysmograph (PPG) waveforms and toe systolic pressure readings are included as required and additional duplex testing as needed. Limited examinations for reoccurring indications may be performed as noted.  ABI Findings: +---------+------------------+-----+----------+--------+ Right    Rt Pressure (mmHg)IndexWaveform  Comment  +---------+------------------+-----+----------+--------+ Brachial 133                                       +---------+------------------+-----+----------+--------+ PTA      Noncompressible        monophasic         +---------+------------------+-----+----------+--------+ DP                              absent             +---------+------------------+-----+----------+--------+ Great Toe0                 0.00 absent             +---------+------------------+-----+----------+--------+ +---------+------------------+-----+-------------------+-----------------------+ Left     Lt Pressure (mmHg)IndexWaveform           Comment                 +---------+------------------+-----+-------------------+-----------------------+ Brachial 143                                                               +---------+------------------+-----+-------------------+-----------------------+ PTA      Noncompressible        monophasic                                 +---------+------------------+-----+-------------------+-----------------------+ DP       Noncompressible        dampened monophasic                        +---------+------------------+-----+-------------------+-----------------------+ Great Toe                       Abnormal           Unable to obtain  pressure secondary to                                                      ulcer location          +---------+------------------+-----+-------------------+-----------------------+ +-------+---------------+-----------+------------+------------+ ABI/TBIToday's ABI    Today's TBIPrevious ABIPrevious TBI +-------+---------------+-----------+------------+------------+ Right  Noncompressible0.00                                +-------+---------------+-----------+------------+------------+ Left   Noncompressible                                    +-------+---------------+-----------+------------+------------+  Summary: Right: Resting right ankle-brachial index indicates noncompressible right lower extremity arteries. Absent right great toe dorsalis pedis artery waveform, absent right great toe PPG waveform. Left: Resting left ankle-brachial index indicates noncompressible left lower extremity arteries. Left great toe PPG waveform is pressent and abnormal, however could not obtain pressure secondary to ulcer location. *See table(s) above for measurements and observations.  Electronically signed by Servando Snare MD on 12/08/2021 at 4:07:10 PM.    Final    [place] Lower Ext Art Left  Result Date: 12/03/2021 CLINICAL DATA:  Left lower extremity wounds EXAM: LEFT LOWER EXTREMITY ARTERIAL DUPLEX SCAN TECHNIQUE: Gray-scale sonography as well as color Doppler and duplex ultrasound was performed to evaluate the lower extremity arteries including the common, superficial and profunda femoral arteries, popliteal artery and calf arteries. COMPARISON:  None Available. FINDINGS: Left lower Extremity Inflow: Abnormal monophasic arterial waveforms. Heterogeneous atherosclerotic plaque. Outflow: Abnormal monophasic arterial waveforms throughout. Runoff: Abnormal monophasic arterial waveforms with diminished flow. IMPRESSION: Abnormal monophasic arterial waveforms  throughout the left lower extremity arterial tree. 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 Electronically Signed   By: Jacqulynn Cadet M.D.   On: 12/03/2021 16:23   DG Foot Complete Left  Result Date: 12/03/2021 CLINICAL DATA:  Left great toe wound EXAM: LEFT FOOT - COMPLETE 3+ VIEW COMPARISON:  Ankle radiographs 02/22/2018 FINDINGS: Bony demineralization. Extensive atherosclerotic vascular calcifications. No bony destructive findings characteristic of active osteomyelitis. No gas tracking in the soft tissues is identified. Dorsal soft tissue swelling along the forefoot. Moderate central collapse of the calcaneus related to prior fracture. Cannot exclude pes planus. Plantar and Achilles calcaneal spurs. IMPRESSION: 1. No bony destructive findings characteristic of active osteomyelitis. 2. Dorsal soft tissue swelling along the forefoot. 3. Bony demineralization. 4. Plantar and Achilles calcaneal spurs. 5. Moderate central collapse of the calcaneus related to prior fracture. Electronically Signed   By: Van Clines M.D.   On: 12/03/2021 14:26   DG Hand Complete Left  Result Date: 12/03/2021 CLINICAL DATA:  Wound of the great toe and wound of the left hand EXAM: LEFT HAND - COMPLETE 3+ VIEW COMPARISON:  None Available. FINDINGS: Substantially abnormal distal phalanx middle finger with poor definition of normal cortical boundaries and extensive amorphous fragmentation. Cannot exclude underlying lucent expansile destructive lesion or osteomyelitis. Remote severe trauma or insult could possibly cause a similar appearance, correlate with patient history and symptoms involving the distal middle finger. Faint linear calcifications in the soft tissues of the ring and index fingers along the ulnar side of the middle phalanges. The fingers are mildly flexed during imaging, which affects  diagnostic accuracy in assessing the phalanges. Substantial arterial atherosclerosis. Mild radial  sided spurring of the head of the index finger metacarpal. Mild degenerative findings at the thumb and index finger MCP joints. IMPRESSION: 1. Substantially abnormal appearance of the distal phalanx of the middle finger with extensive amorphous fragmentation and poor definition of the cortical boundaries. Cannot exclude underlying lucent expansile destructive lesion or osteomyelitis. Remote severe trauma or insult could possibly cause a similar appearance, correlate with patient history and symptoms. 2. Substantial arterial atherosclerosis. 3. Mild degenerative findings at the thumb and index finger MCP joints. Electronically Signed   By: Van Clines M.D.   On: 12/03/2021 14:23    Orson Eva, DO  [facility]  If 7PM-7AM, please contact night-coverage [email or web address] Password [email or web address] 12/24/2021, 1:43 PM   LOS: 2 days

## 2021-12-24 NOTE — Plan of Care (Signed)
  Problem: Acute Rehab PT Goals(only PT should resolve) Goal: Pt Will Go Supine/Side To Sit Outcome: Progressing Flowsheets (Taken 12/24/2021 1009) Pt will go Supine/Side to Sit: with min guard assist Goal: Patient Will Transfer Sit To/From Stand Outcome: Progressing Flowsheets (Taken 12/24/2021 1009) Patient will transfer sit to/from stand: with minimal assist Goal: Pt Will Transfer Bed To Chair/Chair To Bed Outcome: Progressing Flowsheets (Taken 12/24/2021 1009) Pt will Transfer Bed to Chair/Chair to Bed:  with mod assist  with min assist Goal: Pt Will Ambulate Outcome: Progressing Flowsheets (Taken 12/24/2021 1009) Pt will Ambulate:  with moderate assist  10 feet  with rolling walker   Zigmund Gottron, SPT

## 2021-12-24 NOTE — Progress Notes (Signed)
Patient's family requested patient have regular diet while here at the hospital. For breakfast, patient had a dysphagia diet and did well with it. After speech evaluation, a regular diet was ordered, which patient had at lunch. Patient did not tolerate the regular diet well for lunch nor dinner. Diet order changed back to dysphagia diet for patient to be able to tolerate better.

## 2021-12-25 DIAGNOSIS — I5023 Acute on chronic systolic (congestive) heart failure: Secondary | ICD-10-CM | POA: Diagnosis not present

## 2021-12-25 DIAGNOSIS — J9601 Acute respiratory failure with hypoxia: Secondary | ICD-10-CM | POA: Diagnosis not present

## 2021-12-25 DIAGNOSIS — L03115 Cellulitis of right lower limb: Secondary | ICD-10-CM | POA: Diagnosis not present

## 2021-12-25 DIAGNOSIS — I10 Essential (primary) hypertension: Secondary | ICD-10-CM | POA: Diagnosis not present

## 2021-12-25 LAB — BASIC METABOLIC PANEL
Anion gap: 10 (ref 5–15)
BUN: 23 mg/dL (ref 8–23)
CO2: 30 mmol/L (ref 22–32)
Calcium: 8 mg/dL — ABNORMAL LOW (ref 8.9–10.3)
Chloride: 94 mmol/L — ABNORMAL LOW (ref 98–111)
Creatinine, Ser: 0.9 mg/dL (ref 0.61–1.24)
GFR, Estimated: >60 mL/min (ref >60)
Glucose, Bld: 95 mg/dL (ref 70–99)
Potassium: 3.1 mmol/L — ABNORMAL LOW (ref 3.5–5.1)
Sodium: 134 mmol/L — ABNORMAL LOW (ref 135–145)

## 2021-12-25 LAB — MAGNESIUM: Magnesium: 1.7 mg/dL (ref 1.7–2.4)

## 2021-12-25 MED ORDER — METOPROLOL SUCCINATE ER 25 MG PO TB24
25.0000 mg | ORAL_TABLET | Freq: Every day | ORAL | Status: DC
  Administered 2021-12-25 – 2021-12-27 (×3): 25 mg via ORAL
  Filled 2021-12-25 (×3): qty 1

## 2021-12-25 MED ORDER — POTASSIUM CHLORIDE CRYS ER 20 MEQ PO TBCR
40.0000 meq | EXTENDED_RELEASE_TABLET | Freq: Once | ORAL | Status: AC
  Administered 2021-12-25: 40 meq via ORAL
  Filled 2021-12-25: qty 2

## 2021-12-25 MED ORDER — MAGNESIUM SULFATE 2 GM/50ML IV SOLN
2.0000 g | Freq: Once | INTRAVENOUS | Status: AC
  Administered 2021-12-25: 2 g via INTRAVENOUS
  Filled 2021-12-25: qty 50

## 2021-12-25 MED ORDER — FUROSEMIDE 40 MG PO TABS
40.0000 mg | ORAL_TABLET | Freq: Every day | ORAL | Status: DC
  Administered 2021-12-26 – 2021-12-27 (×2): 40 mg via ORAL
  Filled 2021-12-25 (×2): qty 1

## 2021-12-25 NOTE — Progress Notes (Signed)
PROGRESS NOTE  Danny [name]. IRC:[id number] DOB: December 06, 1931 DOA: 12/22/2021 PCP: Baruch Gouty, FNP  Brief History:  88 year old with a history of dementia, persistent atrial fibrillation, coronary artery disease, hypertension, hyperlipidemia, pulmonary hypertension, aortic stenosis, peripheral vascular disease presenting with 2 to 3-day history of progressive generalized weakness and shortness of breath.  At baseline, the patient is normally able to initiate transfer to his wheelchair with which he normally gets around.  However in the last 24 hours he has not been able to go to bed.  He is also had increasing shortness of breath, increasing lower extremity edema and increasing abdominal girth.  Patient himself is a poor historian.  History is supplemented from the patient's niece at the bedside.  He is in the states that he recently had a stent in his left SFA on 12/13/2021.  She states that "he has gone downhill since then".  She also states that she has noted worsening cognitive and functional decline over the last 10 months. There is been no fevers, chills, chest pain, nausea, vomiting, hemoptysis.  He has had some loose stools without hematochezia or melena. In the ED, the patient was afebrile and hemodynamically stable.  He was noted to have oxygen saturation of 75% room air.  He was subsequent placed on BiPAP.  He was given furosemide 40 mg IV x 1 and started on vancomycin and cefepime.  Chest x-ray showed bilateral pleural effusion, right greater than left.  There is left lower lobe infiltrate vs atelectasis.  WBC 7.1, hemoglobin 14.7, platelets 154,000.  BNP was 1048.  Lactate 3.2.  INR 1.5.   Assessment/Plan: Acute respiratory failure with hypoxia and hypercarbia -Secondary to pleural effusion and CHF -Initially on BiPAP -now stable on 2L>>weaned to RA   Acute on chronic systolic CHF -Biventricular failure -Continue IV furosemide 40 mg twice daily -remains fluid  overloaded -Daily weights -Accurate I's and O's--Neg 7.1L -02/06/2021 echo EF 40-45%, severe TR, moderate AS, RV overload -12/12/21 Echo EF 30-35%, severe RV dysfxn, severe TR -appreciate cardiology -obtain ReDS vest reading   Peripheral vascular disease -Status post left SFA stent 12/13/2021 -Continue aspirin   Cellulitis right leg -continue cefazolin   Persistent atrial fibrillation -no longer on warfarin due to frailty and falls -restart metoprolol succinate   Essential HTN -BPs soft with diuresis   Severe Aortic Stenosis -he is frail and not a candidate for intervention.    Major neurocognitive disorder -at risk for hospital delirium -not on therapy outpatient   Coronary Artery Disease -not a candidate for aggressive intervention -1/18.23 cath---Left main ostial occlusion; Nonobstructive disease in the RCA; patent grafts  -no chest pain presently -continue ASA   Hypokalemia -replete -check mag--1.7   GOC -appreciate palliative consult -plan to optimize as much as possible and d/c home with hospice care if spouse can provide care and/or care can be supplemented -if extra help cannot be arranged for home, may need to go to SNF    Family Communication:   spouse and niece at bedside 12/2   Consultants:  cardiology/palliative   Code Status:  DNR   DVT Prophylaxis:  [place] Heparin      Procedures: As Listed in Progress Note Above   Antibiotics: None     Subjective: Patient denies fevers, chills, headache, chest pain, dyspnea, nausea, vomiting, diarrhea, abdominal pain, dysuria, hematuria, hematochezia, and melena.   Objective: Vitals:   12/25/21 1000 12/25/21 1100 12/25/21 1200 12/25/21 1300  BP: (!) 128/54 112/70 125/67 (!) 96/44  Pulse: 97     Resp: (!) 26 (!) 26 17 (!) 21  Temp:      TempSrc:      SpO2: 91%     Weight:      Height:        Intake/Output Summary (Last 24 hours) at 12/25/2021 1432 Last data filed at 12/25/2021 0800 Gross per 24  hour  Intake 100 ml  Output 2425 ml  Net -2325 ml   Weight change: 0.9 kg Exam:  General:  Pt is alert, follows commands appropriately, not in acute distress HEENT: No icterus, No thrush, No neck mass, [place]/AT Cardiovascular: IRRR, S1/S2, no rubs, no gallops Respiratory: bibasilar crackles. No wheeze Abdomen: Soft/+BS, non tender, non distended, no guarding Extremities: trace LE edema, No lymphangitis, No petechiae, No rashes, no synovitis   Data Reviewed: I have personally reviewed following labs and imaging studies Basic Metabolic Panel: Recent Labs  Lab 12/22/21 1259 12/22/21 1302 12/23/21 0600 12/24/21 0300 12/25/21 0410  NA 137 137 137 136 134*  K 5.3 5.3* 4.1 3.3* 3.1*  CL 102 100 100 95* 94*  CO2 '21 26 27 30 30  '$ GLUCOSE 80 86 85 74 95  BUN 34 36* 37* 31* 23  CREATININE 0.97 1.09 1.20 1.12 0.90  CALCIUM 8.2 8.5* 8.2* 8.1* 8.0*  MG  --   --  2.2  --  1.7   Liver Function Tests: Recent Labs  Lab 12/22/21 1302  AST 29  ALT 26  ALKPHOS 130*  BILITOT 2.1*  PROT 6.1*  ALBUMIN 2.7*   No results for input(s): "LIPASE", "AMYLASE" in the last 168 hours. Recent Labs  Lab 12/23/21 0600  AMMONIA 34   Coagulation Profile: Recent Labs  Lab 12/22/21 0920  INR 1.5*   CBC: Recent Labs  Lab 12/22/21 0920 12/23/21 0600 12/24/21 0300  WBC 7.1 9.2 8.4  NEUTROABS 5.7  --   --   HGB 14.7 12.1* 12.2*  HCT 44.1 36.6* 37.1*  MCV 96.3 95.3 95.1  PLT 154 200 200   Cardiac Enzymes: No results for input(s): "CKTOTAL", "CKMB", "CKMBINDEX", "TROPONINI" in the last 168 hours. BNP: Invalid input(s): "POCBNP" CBG: Recent Labs  Lab 12/22/21 1929 12/22/21 1957 12/22/21 2313 12/23/21 0305 12/23/21 0613  GLUCAP 62* 103* 84 73 82   HbA1C: No results for input(s): "HGBA1C" in the last 72 hours. Urine analysis:    Component Value Date/Time   COLORURINE YELLOW 12/22/2021 1241   APPEARANCEUR CLEAR 12/22/2021 1241   APPEARANCEUR Clear 05/05/2017 1346   LABSPEC  1.009 12/22/2021 1241   PHURINE 5.0 12/22/2021 1241   GLUCOSEU NEGATIVE 12/22/2021 1241   HGBUR NEGATIVE 12/22/2021 1241   BILIRUBINUR NEGATIVE 12/22/2021 1241   BILIRUBINUR Negative 05/05/2017 1346   KETONESUR NEGATIVE 12/22/2021 1241   PROTEINUR NEGATIVE 12/22/2021 1241   UROBILINOGEN negative 02/11/2015 1142   UROBILINOGEN 0.2 09/19/2012 2034   NITRITE NEGATIVE 12/22/2021 1241   LEUKOCYTESUR NEGATIVE 12/22/2021 1241   Sepsis Labs: '@LABRCNTIP'$ (procalcitonin:4,lacticidven:4) ) Recent Results (from the past 240 hour(s))  Resp Panel by RT-PCR (Flu A&B, Covid) Anterior Nasal Swab     Status: None   Collection Time: 12/22/21  9:21 AM   Specimen: Anterior Nasal Swab  Result Value Ref Range Status   SARS Coronavirus 2 by RT PCR NEGATIVE NEGATIVE Final    Comment: (NOTE) SARS-CoV-2 target nucleic acids are NOT DETECTED.  The SARS-CoV-2 RNA is generally detectable in upper respiratory specimens during the acute phase  of infection. The lowest concentration of SARS-CoV-2 viral copies this assay can detect is 138 copies/mL. A negative result does not preclude SARS-Cov-2 infection and should not be used as the sole basis for treatment or other patient management decisions. A negative result may occur with  improper specimen collection/handling, submission of specimen other than nasopharyngeal swab, presence of viral mutation(s) within the areas targeted by this assay, and inadequate number of viral copies(<138 copies/mL). A negative result must be combined with clinical observations, patient history, and epidemiological information. The expected result is Negative.  Fact Sheet for Patients:  EntrepreneurPulse.com.au  Fact Sheet for Healthcare Providers:  IncredibleEmployment.be  This test is no t yet approved or cleared by the [place] FDA and  has been authorized for detection and/or diagnosis of SARS-CoV-2 by FDA under an Emergency Use  Authorization (EUA). This EUA will remain  in effect (meaning this test can be used) for the duration of the COVID-19 declaration under Section 564(b)(1) of the Act, 21 U.S.C.section 360bbb-3(b)(1), unless the authorization is terminated  or revoked sooner.       Influenza A by PCR NEGATIVE NEGATIVE Final   Influenza B by PCR NEGATIVE NEGATIVE Final    Comment: (NOTE) The Xpert Xpress SARS-CoV-2/FLU/RSV plus assay is intended as an aid in the diagnosis of influenza from Nasopharyngeal swab specimens and should not be used as a sole basis for treatment. Nasal washings and aspirates are unacceptable for Xpert Xpress SARS-CoV-2/FLU/RSV testing.  Fact Sheet for Patients: EntrepreneurPulse.com.au  Fact Sheet for Healthcare Providers: IncredibleEmployment.be  This test is not yet approved or cleared by the [place] FDA and has been authorized for detection and/or diagnosis of SARS-CoV-2 by FDA under an Emergency Use Authorization (EUA). This EUA will remain in effect (meaning this test can be used) for the duration of the COVID-19 declaration under Section 564(b)(1) of the Act, 21 U.S.C. section 360bbb-3(b)(1), unless the authorization is terminated or revoked.  Performed at [facility], [address] [place], [place] [address]   Culture, blood (routine x 2)     Status: None (Preliminary result)   Collection Time: 12/22/21 10:39 AM   Specimen: BLOOD  Result Value Ref Range Status   Specimen Description BLOOD BLOOD RIGHT HAND  Final   Special Requests NONE  Final   Culture   Final    NO GROWTH 3 DAYS Performed at [facility], [address], [place], [place] [address]    Report Status PENDING  Incomplete  Culture, blood (routine x 2)     Status: None (Preliminary result)   Collection Time: 12/22/21 10:39 AM   Specimen: BLOOD  Result Value Ref Range Status   Specimen Description BLOOD BLOOD LEFT HAND  Final   Special Requests NONE   Final   Culture   Final    NO GROWTH 3 DAYS Performed at [facility], [address] [place], [place] [address]    Report Status PENDING  Incomplete  MRSA Next Gen by PCR, Nasal     Status: None   Collection Time: 12/22/21 12:53 PM   Specimen: Nasal Mucosa; Nasal Swab  Result Value Ref Range Status   MRSA by PCR Next Gen NOT DETECTED NOT DETECTED Final    Comment: (NOTE) The GeneXpert MRSA Assay (FDA approved for NASAL specimens only), is one component of a comprehensive MRSA colonization surveillance program. It is not intended to diagnose MRSA infection nor to guide or monitor treatment for MRSA infections. Test performance is not FDA approved in  patients less than 42 years old. Performed at [facility], [address], [place], [place] [address]      Scheduled Meds:  aspirin  81 mg Oral Daily   Chlorhexidine Gluconate Cloth  6 each Topical Q0600   furosemide  40 mg Intravenous BID   heparin  5,000 Units Subcutaneous Q8H   metoprolol succinate  25 mg Oral Daily   mouth rinse  15 mL Mouth Rinse 4 times per day   sodium chloride flush  3 mL Intravenous Q12H   Continuous Infusions:  sodium chloride      ceFAZolin (ANCEF) IV Stopped (12/25/21 0617)    Procedures/Studies: DG CHEST PORT 1 VIEW  Result Date: 12/23/2021 CLINICAL DATA:  [id number] with bilateral pleural effusions and systolic CHF, respiratory distress. EXAM: PORTABLE CHEST 1 VIEW COMPARISON:  Portable chest yesterday at 9:44 a.m. FINDINGS: 4:51 a.m. CABG changes, cardiomegaly and a heavily calcified thoracic aorta are again noted. There is central vascular prominence which appears similar, large right and moderate left pleural effusions and either a mild improvement in the right effusion or redistribution. Overlying atelectasis or airspace disease on the right-greater-than-left appear similar on the left but improved on the right, with the right lower lung field still obscured by the fluid. The lung fields are otherwise  clear. In all other respects there are no further changes. IMPRESSION: 1. Mild improvement in the right pleural effusion versus fluid redistribution. 2. Cardiomegaly with central vascular congestion and right-greater-than-left pleural effusions with overlying atelectasis or airspace disease. 3. Aortic atherosclerosis. Electronically Signed   By: Telford Nab M.D.   On: 12/23/2021 06:09   ECHOCARDIOGRAM COMPLETE  Result Date: 12/22/2021    ECHOCARDIOGRAM REPORT   Patient Name:   Danny [name]. Date of Exam: 12/22/2021 Medical Rec #:  [id number]         Height:       74.0 in Accession #:    [id number]        Weight:       174.8 lb Date of Birth:  [birth year]         BSA:          2.053 m Patient Age:    72 years          BP:           122/79 mmHg Patient Gender: M                 HR:           84 bpm. Exam Location:  [facility] Procedure: 2D Echo, Cardiac Doppler and Color Doppler Indications:    CHF-Acute Systolic D40.81  History:        Patient has prior history of Echocardiogram examinations, most                 recent 02/06/2021. CAD, Prior CABG, Arrythmias:Atrial                 Fibrillation; Risk Factors:Hypertension, Dyslipidemia and Former                 Smoker.  Sonographer:    Alvino Chapel RCS Referring Phys: [phone] Kenadie Royce IMPRESSIONS  1. Left ventricular ejection fraction, by estimation, is 30 to 35%. The left ventricle has moderately decreased function. The left ventricle has no regional wall motion abnormalities. Left ventricular diastolic function could not be evaluated. There is the interventricular septum is flattened in systole and diastole, consistent with right ventricular pressure and volume  overload. RV volume overload causing low LV filling output.  2. Severe right ventricular enlargement. Severe right ventricular systolic dysfunction.  3. Left atrial size was severely dilated.  4. Right atrial size was severely dilated.  5. The mitral valve is abnormal. Trivial mitral valve  regurgitation. No evidence of mitral stenosis.  6. Tricuspid valve regurgitation is severe. Unable to calculate pulmonary artery systolic pressure due to rapid pressure equilibration between RV and RA from severe TR.  7. The aortic valve is abnormal. There is severe calcifcation of the aortic valve. Aortic valve regurgitation is not visualized. There is low flow-low gradient severe aortic valve stenosis with V max 3.12 m/s, mean PG 24 mm Hg, Ao valve area by continuity equation 0.35 cm2 and SVI 12.7 ml/m2. Cardiology consult is recommended. Comparison(s): Changes from prior study are noted. LVEF decreased to 30-35%, RV function severely reduced and low flow-low gradient severe aortic valve stenosis now. FINDINGS  Left Ventricle: Left ventricular ejection fraction, by estimation, is 30 to 35%. The left ventricle has moderately decreased function. The left ventricle has no regional wall motion abnormalities. Definity contrast agent was given IV to delineate the left ventricular endocardial borders. The left ventricular internal cavity size was normal in size. There is no left ventricular hypertrophy. The interventricular septum is flattened in systole and diastole, consistent with right ventricular pressure and  volume overload. Left ventricular diastolic function could not be evaluated due to nondiagnostic images. Left ventricular diastolic function could not be evaluated. Right Ventricle: Unable to calculate PASP due to rapid pressure equilibration between RV and RA due to severe TR. The right ventricular size is severely enlarged. Right vetricular wall thickness was not well visualized. Right ventricular systolic function is severely reduced. Left Atrium: Left atrial size was severely dilated. Right Atrium: Right atrial size was severely dilated. Pericardium: There is no evidence of pericardial effusion. Mitral Valve: The mitral valve is abnormal. There is moderate calcification of the mitral valve leaflet(s).  Trivial mitral valve regurgitation. No evidence of mitral valve stenosis. Tricuspid Valve: The tricuspid valve is abnormal. Tricuspid valve regurgitation is severe. No evidence of tricuspid stenosis. Aortic Valve: The aortic valve is abnormal. There is severe calcifcation of the aortic valve. There is moderate aortic valve annular calcification. Aortic valve regurgitation is not visualized. Severe aortic stenosis is present. Aortic valve mean gradient measures 23.0 mmHg. Aortic valve peak gradient measures 36.8 mmHg. Aortic valve area, by VTI measures 0.35 cm. Pulmonic Valve: The pulmonic valve was not well visualized. Pulmonic valve regurgitation is not visualized. No evidence of pulmonic stenosis. Aorta: The aortic root is normal in size and structure. Venous: IVC assessment for right atrial pressure unable to be performed due to mechanical ventilation. IAS/Shunts: No atrial level shunt detected by color flow Doppler.  LEFT VENTRICLE PLAX 2D LVIDd:         3.70 cm   Diastology LVIDs:         3.10 cm   LV e' medial:    5.44 cm/s LV PW:         1.20 cm   LV E/e' medial:  24.8 LV IVS:        1.00 cm   LV e' lateral:   7.07 cm/s LVOT diam:     1.90 cm   LV E/e' lateral: 19.1 LV SV:         26 LV SV Index:   13 LVOT Area:     2.84 cm  RIGHT VENTRICLE RV S prime:  6.90 cm/s TAPSE (M-mode): 1.3 cm LEFT ATRIUM              Index        RIGHT ATRIUM           Index LA diam:        4.70 cm  2.29 cm/m   RA Area:     40.70 cm LA Vol (A2C):   91.3 ml  44.48 ml/m  RA Volume:   197.00 ml 95.97 ml/m LA Vol (A4C):   133.0 ml 64.79 ml/m LA Biplane Vol: 113.0 ml 55.05 ml/m  AORTIC VALVE AV Area (Vmax):    0.43 cm AV Area (Vmean):   0.44 cm AV Area (VTI):     0.35 cm AV Vmax:           303.50 cm/s AV Vmean:          224.000 cm/s AV VTI:            0.749 m AV Peak Grad:      36.8 mmHg AV Mean Grad:      23.0 mmHg LVOT Vmax:         45.50 cm/s LVOT Vmean:        34.600 cm/s LVOT VTI:          0.092 m LVOT/AV VTI ratio:  0.12  AORTA Ao Root diam: 3.30 cm MITRAL VALVE                TRICUSPID VALVE MV Area (PHT): 5.02 cm     TR Peak grad:   37.2 mmHg MV Decel Time: 151 msec     TR Vmax:        305.00 cm/s MR Peak grad: 32.0 mmHg MR Mean grad: 20.0 mmHg     SHUNTS MR Vmax:      283.00 cm/s   Systemic VTI:  0.09 m MR Vmean:     209.5 cm/s    Systemic Diam: 1.90 cm MV E velocity: 135.00 cm/s Vishnu Priya Mallipeddi Electronically signed by Lorelee Cover Mallipeddi Signature Date/Time: 12/22/2021/8:30:56 PM    Final    DG Chest Port 1 View  Result Date: 12/22/2021 CLINICAL DATA:  Weakness, respiratory distress EXAM: PORTABLE CHEST 1 VIEW COMPARISON:  02/08/2021 FINDINGS: Large right pleural effusion, increased since prior study. Moderate left pleural effusion, also increased. Increasing airspace disease throughout the right lung and in the left lower lobe. Heart is normal size. Prior CABG. IMPRESSION: Large right pleural effusion and moderate left pleural effusion. Airspace disease throughout the right lung and left lower lobe could reflect atelectasis or infiltrate/pneumonia. Electronically Signed   By: Rolm Baptise M.D.   On: 12/22/2021 09:53   PERIPHERAL VASCULAR CATHETERIZATION  Result Date: 12/13/2021 Images from the original result were not included. Patient name: Danny [name]. MRN: [id number] DOB: Mar 18, 1931 Sex: male 12/13/2021 Pre-operative Diagnosis: Chronic left lower extremity limb threatening ischemia with toe ulceration Post-operative diagnosis:  Same Surgeon:  Erlene Quan C. Donzetta Matters, MD Procedure Performed: 1.  Ultrasound-guided cannulation right common femoral artery 2.  Aortogram and bilateral lower extremity angiography 3.  Stent of left SFA with 6 x 60 mm Elluvia 4.  mynx device closure right common femoral artery Indications: 88 year old male with chronic bilateral lower extremity threatening ischemia with ulceration of the left great toe and right medial ankle severe depressed ABIs.  Motor pressure calculated on  the left given the ulceration.  He indicated for angiography with possible intervention. Findings: Aortoiliac segments are heavily calcified  although there is no flow-limiting stenosis.  Bilateral renal arteries are patent.  Left common femoral artery heavily calcified with approximate 50% stenosis that was not treated.  Left SFA also heavily calcified there is 1 area in the mid segment with 80% stenosis that was stented to less than 20% residual stenosis with heavy calcification.  Distally on the left there is approximately 40% stenosis of the distal SFA proximal popliteal artery and then below the knee there is no visible anterior tibial artery.  The peroneal artery has initial 50% stenosis within the runoff to the ankle.  Posterior tibial artery initially has runoff within appears to occlude for proximally 2 cm in the distal segment and then reconstitutes into the foot and no intervention was undertaken below the knee.  On the right side the patient has heavily calcified common femoral artery at least 50% stenosis and then heavily calcified SFA and popliteal arteries above the knee and are patent appears to be occluded at the below the knee popliteal artery level with reconstitution of posterior tibial artery. Patient to follow-up in a few weeks to evaluate for wound healing and can consider scheduling right lower extremity angiogram at that time.  Procedure:  The patient was identified in the holding area and taken to room 8.  The patient was then placed supine on the table and prepped and draped in the usual sterile fashion.  A time out was called.  Ultrasound was used to evaluate the right common femoral artery which was calcified.  The area was anesthetized with 1% lidocaine cannulated micropuncture needle followed by wire and sheath.  Images saved the permanent record.  We placed a Bentson wire followed by 5 [place] sheath performed aortogram.  We then crossed the bifurcation with Bentson wire and Navi cross  catheter for left lower extremity angiogram.  With the above findings we elected for treatment.  We placed a Glidewire advantage followed by a long 6 [place] sheath patient was heparinized with 6000 units of heparin.  We then crossed into the distal popliteal artery performed further angiography below the knee to better define the tibial vessels.  We then primarily stented the SFA with a 6 x 60 mm drug-eluting stent and postdilated this with 5 mm balloon to 0% residual stenosis.  At this time we retracted the sheath into the right external iliac artery and perform right lower extremity angiography which was limited by patient's contracture of his right knee and motion degradation.  We then exchanged for a short 6 French sheath over the Glidewire advantage and then deployed a minx device.  He tolerated the procedure without any complication. Contrast: 90 cc Brandon C. Donzetta Matters, MD [facility] Office: [phone] Pager: [phone]   VAS [place] ABI WITH/WO TBI  Result Date: 12/08/2021  LOWER EXTREMITY DOPPLER STUDY Patient Name:  Danny [name].  Date of Exam:   12/08/2021 Medical Rec #: [id number]          Accession #:    [id number] Date of Birth: [birth year]          Patient Gender: M Patient Age:   33 years Exam Location:  [facility] Procedure:      VAS [place] ABI WITH/WO TBI Referring Phys: CHRISTOPHER DICKSON --------------------------------------------------------------------------------  Indications: Ulceration, and peripheral artery disease. High Risk Factors: Hypertension, hyperlipidemia.  Comparison Study: No prior ABI                   12/03/21 US  Arterial lower extremity duplex left- Abnormal                   monophasic arterial waveforms throughout the left lower                   extremity arterial tree. Findings suggest proximal inflow                   (aortoiliac) occlusive disease. Performing Technologist: Maudry Mayhew MHA, RVT, RDCS, RDMS   Examination Guidelines: A complete evaluation includes at minimum, Doppler waveform signals and systolic blood pressure reading at the level of bilateral brachial, anterior tibial, and posterior tibial arteries, when vessel segments are accessible. Bilateral testing is considered an integral part of a complete examination. Photoelectric Plethysmograph (PPG) waveforms and toe systolic pressure readings are included as required and additional duplex testing as needed. Limited examinations for reoccurring indications may be performed as noted.  ABI Findings: +---------+------------------+-----+----------+--------+ Right    Rt Pressure (mmHg)IndexWaveform  Comment  +---------+------------------+-----+----------+--------+ Brachial 133                                       +---------+------------------+-----+----------+--------+ PTA      Noncompressible        monophasic         +---------+------------------+-----+----------+--------+ DP                              absent             +---------+------------------+-----+----------+--------+ Great Toe0                 0.00 absent             +---------+------------------+-----+----------+--------+ +---------+------------------+-----+-------------------+-----------------------+ Left     Lt Pressure (mmHg)IndexWaveform           Comment                 +---------+------------------+-----+-------------------+-----------------------+ Brachial 143                                                               +---------+------------------+-----+-------------------+-----------------------+ PTA      Noncompressible        monophasic                                 +---------+------------------+-----+-------------------+-----------------------+ DP       Noncompressible        dampened monophasic                        +---------+------------------+-----+-------------------+-----------------------+ Great Toe                        Abnormal           Unable to obtain  pressure secondary to                                                      ulcer location          +---------+------------------+-----+-------------------+-----------------------+ +-------+---------------+-----------+------------+------------+ ABI/TBIToday's ABI    Today's TBIPrevious ABIPrevious TBI +-------+---------------+-----------+------------+------------+ Right  Noncompressible0.00                                +-------+---------------+-----------+------------+------------+ Left   Noncompressible                                    +-------+---------------+-----------+------------+------------+  Summary: Right: Resting right ankle-brachial index indicates noncompressible right lower extremity arteries. Absent right great toe dorsalis pedis artery waveform, absent right great toe PPG waveform. Left: Resting left ankle-brachial index indicates noncompressible left lower extremity arteries. Left great toe PPG waveform is pressent and abnormal, however could not obtain pressure secondary to ulcer location. *See table(s) above for measurements and observations.  Electronically signed by Servando Snare MD on 12/08/2021 at 4:07:10 PM.    Final    [place] Lower Ext Art Left  Result Date: 12/03/2021 CLINICAL DATA:  Left lower extremity wounds EXAM: LEFT LOWER EXTREMITY ARTERIAL DUPLEX SCAN TECHNIQUE: Gray-scale sonography as well as color Doppler and duplex ultrasound was performed to evaluate the lower extremity arteries including the common, superficial and profunda femoral arteries, popliteal artery and calf arteries. COMPARISON:  None Available. FINDINGS: Left lower Extremity Inflow: Abnormal monophasic arterial waveforms. Heterogeneous atherosclerotic plaque. Outflow: Abnormal monophasic arterial waveforms throughout. Runoff: Abnormal monophasic arterial waveforms with diminished flow.  IMPRESSION: Abnormal monophasic arterial waveforms throughout the left lower extremity arterial tree. 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 Electronically Signed   By: Jacqulynn Cadet M.D.   On: 12/03/2021 16:23   DG Foot Complete Left  Result Date: 12/03/2021 CLINICAL DATA:  Left great toe wound EXAM: LEFT FOOT - COMPLETE 3+ VIEW COMPARISON:  Ankle radiographs 02/22/2018 FINDINGS: Bony demineralization. Extensive atherosclerotic vascular calcifications. No bony destructive findings characteristic of active osteomyelitis. No gas tracking in the soft tissues is identified. Dorsal soft tissue swelling along the forefoot. Moderate central collapse of the calcaneus related to prior fracture. Cannot exclude pes planus. Plantar and Achilles calcaneal spurs. IMPRESSION: 1. No bony destructive findings characteristic of active osteomyelitis. 2. Dorsal soft tissue swelling along the forefoot. 3. Bony demineralization. 4. Plantar and Achilles calcaneal spurs. 5. Moderate central collapse of the calcaneus related to prior fracture. Electronically Signed   By: Van Clines M.D.   On: 12/03/2021 14:26   DG Hand Complete Left  Result Date: 12/03/2021 CLINICAL DATA:  Wound of the great toe and wound of the left hand EXAM: LEFT HAND - COMPLETE 3+ VIEW COMPARISON:  None Available. FINDINGS: Substantially abnormal distal phalanx middle finger with poor definition of normal cortical boundaries and extensive amorphous fragmentation. Cannot exclude underlying lucent expansile destructive lesion or osteomyelitis. Remote severe trauma or insult could possibly cause a similar appearance, correlate with patient history and symptoms involving the distal middle finger. Faint linear calcifications in the soft tissues of the ring and index fingers along the ulnar side of the middle phalanges. The fingers are mildly flexed during imaging, which affects  diagnostic accuracy in assessing the phalanges.  Substantial arterial atherosclerosis. Mild radial sided spurring of the head of the index finger metacarpal. Mild degenerative findings at the thumb and index finger MCP joints. IMPRESSION: 1. Substantially abnormal appearance of the distal phalanx of the middle finger with extensive amorphous fragmentation and poor definition of the cortical boundaries. Cannot exclude underlying lucent expansile destructive lesion or osteomyelitis. Remote severe trauma or insult could possibly cause a similar appearance, correlate with patient history and symptoms. 2. Substantial arterial atherosclerosis. 3. Mild degenerative findings at the thumb and index finger MCP joints. Electronically Signed   By: Van Clines M.D.   On: 12/03/2021 14:23    Danny Eva, DO  [facility]  If 7PM-7AM, please contact night-coverage [email or web address] Password [email or web address] 12/25/2021, 2:32 PM   LOS: 3 days

## 2021-12-25 NOTE — TOC Progression Note (Signed)
Transition of Care (TOC) - Progression Note    Patient Details  Name: Danny [name]. MRN: [id number] Date of Birth: May 23, 1931  Transition of Care [facility] CM/SW [place], [place] Phone Number: 12/25/2021, 12:26 PM  Clinical Narrative:    CSW spoke with pts niece who states she has heard from [facility] but has not heard from the other company at this time. Family does not have care giving services in place at this time. TOC to follow.   Expected Discharge Plan: Home w Hospice Care Barriers to Discharge: Continued Medical Work up  Expected Discharge Plan and Services Expected Discharge Plan: [place] Determinants of Health (SDOH) Interventions    Readmission Risk Interventions    04/30/2021    2:35 PM  Readmission Risk Prevention Plan  Post Dischage Appt Complete  Medication Screening Complete  Transportation Screening Complete

## 2021-12-26 DIAGNOSIS — I5023 Acute on chronic systolic (congestive) heart failure: Secondary | ICD-10-CM | POA: Diagnosis not present

## 2021-12-26 DIAGNOSIS — J9601 Acute respiratory failure with hypoxia: Secondary | ICD-10-CM | POA: Diagnosis not present

## 2021-12-26 DIAGNOSIS — I10 Essential (primary) hypertension: Secondary | ICD-10-CM | POA: Diagnosis not present

## 2021-12-26 DIAGNOSIS — L03115 Cellulitis of right lower limb: Secondary | ICD-10-CM | POA: Diagnosis not present

## 2021-12-26 LAB — BASIC METABOLIC PANEL
Anion gap: 10 (ref 5–15)
BUN: 20 mg/dL (ref 8–23)
CO2: 30 mmol/L (ref 22–32)
Calcium: 7.9 mg/dL — ABNORMAL LOW (ref 8.9–10.3)
Chloride: 91 mmol/L — ABNORMAL LOW (ref 98–111)
Creatinine, Ser: 0.72 mg/dL (ref 0.61–1.24)
GFR, Estimated: >60 mL/min (ref >60)
Glucose, Bld: 89 mg/dL (ref 70–99)
Potassium: 3.2 mmol/L — ABNORMAL LOW (ref 3.5–5.1)
Sodium: 131 mmol/L — ABNORMAL LOW (ref 135–145)

## 2021-12-26 LAB — MAGNESIUM: Magnesium: 2.1 mg/dL (ref 1.7–2.4)

## 2021-12-26 MED ORDER — POTASSIUM CHLORIDE CRYS ER 20 MEQ PO TBCR
40.0000 meq | EXTENDED_RELEASE_TABLET | Freq: Once | ORAL | Status: AC
  Administered 2021-12-26: 40 meq via ORAL
  Filled 2021-12-26: qty 2

## 2021-12-26 MED ORDER — CEPHALEXIN 500 MG PO CAPS
500.0000 mg | ORAL_CAPSULE | Freq: Four times a day (QID) | ORAL | Status: DC
  Administered 2021-12-26 – 2021-12-27 (×4): 500 mg via ORAL
  Filled 2021-12-26 (×4): qty 1

## 2021-12-26 NOTE — Progress Notes (Signed)
Pt assisted back to bed from recliner, pt very weak and unable to stand unassisted, required staff members x3 and FWW to move pt from recliner to bed. Pt tolerated well. Penis and scrotum greatly swelled, small amount of pink-tinged urine noted dripping from penis. Redness noted on mons pubis area from penis to lower abd, warm to touch and +2 edema. Area marked with pen for continued evaluation. Pt denies any c/o pain or pressure in lower abd/bladder area with palpation. Pt with +2/+3 edema from mid low back down to knees. Pt denies any c/o pain.

## 2021-12-26 NOTE — Progress Notes (Signed)
PROGRESS NOTE  Danny [name]. BHA:[id number] DOB: 04-Mar-1931 DOA: 12/22/2021 PCP: Baruch Gouty, FNP  Brief History:  88 year old with a history of dementia, persistent atrial fibrillation, coronary artery disease, hypertension, hyperlipidemia, pulmonary hypertension, aortic stenosis, peripheral vascular disease presenting with 2 to 3-day history of progressive generalized weakness and shortness of breath.  At baseline, the patient is normally able to initiate transfer to his wheelchair with which he normally gets around.  However in the last 24 hours he has not been able to go to bed.  He is also had increasing shortness of breath, increasing lower extremity edema and increasing abdominal girth.  Patient himself is a poor historian.  History is supplemented from the patient's niece at the bedside.  He is in the states that he recently had a stent in his left SFA on 12/13/2021.  She states that "he has gone downhill since then".  She also states that she has noted worsening cognitive and functional decline over the last 10 months. There is been no fevers, chills, chest pain, nausea, vomiting, hemoptysis.  He has had some loose stools without hematochezia or melena. In the ED, the patient was afebrile and hemodynamically stable.  He was noted to have oxygen saturation of 75% room air.  He was subsequent placed on BiPAP.  He was given furosemide 40 mg IV x 1 and started on vancomycin and cefepime.  Chest x-ray showed bilateral pleural effusion, right greater than left.  There is left lower lobe infiltrate vs atelectasis.  WBC 7.1, hemoglobin 14.7, platelets 154,000.  BNP was 1048.  Lactate 3.2.  INR 1.5.   Assessment/Plan: Acute respiratory failure with hypoxia and hypercarbia -Secondary to pleural effusion and CHF -Initially on BiPAP -now stable on 2L>>weaned to RA   Acute on chronic systolic CHF -Biventricular failure -initially on IV furosemide 40 mg twice daily -transitioned to  lasix 40 mg po daily 12/3 -Daily weights -Accurate I's and O's--Neg 8.7L -02/06/2021 echo EF 40-45%, severe TR, moderate AS, RV overload -12/12/21 Echo EF 30-35%, severe RV dysfxn, severe TR -appreciate cardiology -12/2 ReDS vest--29%   Peripheral vascular disease -Status post left SFA stent 12/13/2021 -Continue aspirin   Cellulitis right leg -continue cefazolin>>transition to cephalexin -improved   Persistent atrial fibrillation -no longer on warfarin due to frailty and falls -restarted metoprolol succinate -rate controlled   Essential HTN -BPs soft with diuresis   Severe Aortic Stenosis -he is frail and not a candidate for intervention.    Major neurocognitive disorder -at risk for hospital delirium -not on therapy outpatient   Coronary Artery Disease -not a candidate for aggressive intervention -1/18.23 cath---Left main ostial occlusion; Nonobstructive disease in the RCA; patent grafts  -no chest pain presently -continue ASA   Hypokalemia -replete -check mag--1.7   GOC -appreciate palliative consult -plan to optimize as much as possible and d/c home with hospice care if spouse can provide care and/or care can be supplemented -if extra help cannot be arranged for home, may need to go to SNF    Family Communication:    niece at bedside 12/3   Consultants:  cardiology/palliative   Code Status:  DNR   DVT Prophylaxis:  Bond Heparin      Procedures: As Listed in Progress Note Above   Antibiotics: None           Subjective: Patient denies fevers, chills, headache, chest pain, dyspnea, nausea, vomiting, diarrhea, abdominal pain, dysuria, hematuria, hematochezia, and  melena.   Objective: Vitals:   12/26/21 1300 12/26/21 1329 12/26/21 1409 12/26/21 1621  BP: 118/60  112/64 (!) 107/56  Pulse: (!) 59  79 78  Resp: (!) '26 15 16   '$ Temp: 98.1 F (36.7 C)  98.6 F (37 C)   TempSrc: Oral     SpO2: 98%   100%  Weight:      Height:         Intake/Output Summary (Last 24 hours) at 12/26/2021 1645 Last data filed at 12/26/2021 1500 Gross per 24 hour  Intake 1123.75 ml  Output 1501 ml  Net -377.25 ml   Weight change: 0 kg Exam:  General:  Pt is alert, follows commands appropriately, not in acute distress HEENT: No icterus, No thrush, No neck mass, [place]/AT Cardiovascular: IRRR, S1/S2, no rubs, no gallops Respiratory: CTA bilaterally, no wheezing, no crackles, no rhonchi Abdomen: Soft/+BS, non tender, non distended, no guarding Extremities: No edema, No lymphangitis, No petechiae, No rashes, no synovitis   Data Reviewed: I have personally reviewed following labs and imaging studies Basic Metabolic Panel: Recent Labs  Lab 12/22/21 1302 12/23/21 0600 12/24/21 0300 12/25/21 0410 12/26/21 0316  NA 137 137 136 134* 131*  K 5.3* 4.1 3.3* 3.1* 3.2*  CL 100 100 95* 94* 91*  CO2 '26 27 30 30 30  '$ GLUCOSE 86 85 74 95 89  BUN 36* 37* 31* 23 20  CREATININE 1.09 1.20 1.12 0.90 0.72  CALCIUM 8.5* 8.2* 8.1* 8.0* 7.9*  MG  --  2.2  --  1.7 2.1   Liver Function Tests: Recent Labs  Lab 12/22/21 1302  AST 29  ALT 26  ALKPHOS 130*  BILITOT 2.1*  PROT 6.1*  ALBUMIN 2.7*   No results for input(s): "LIPASE", "AMYLASE" in the last 168 hours. Recent Labs  Lab 12/23/21 0600  AMMONIA 34   Coagulation Profile: Recent Labs  Lab 12/22/21 0920  INR 1.5*   CBC: Recent Labs  Lab 12/22/21 0920 12/23/21 0600 12/24/21 0300  WBC 7.1 9.2 8.4  NEUTROABS 5.7  --   --   HGB 14.7 12.1* 12.2*  HCT 44.1 36.6* 37.1*  MCV 96.3 95.3 95.1  PLT 154 200 200   Cardiac Enzymes: No results for input(s): "CKTOTAL", "CKMB", "CKMBINDEX", "TROPONINI" in the last 168 hours. BNP: Invalid input(s): "POCBNP" CBG: Recent Labs  Lab 12/22/21 1929 12/22/21 1957 12/22/21 2313 12/23/21 0305 12/23/21 0613  GLUCAP 62* 103* 84 73 82   HbA1C: No results for input(s): "HGBA1C" in the last 72 hours. Urine analysis:    Component Value  Date/Time   COLORURINE YELLOW 12/22/2021 1241   APPEARANCEUR CLEAR 12/22/2021 1241   APPEARANCEUR Clear 05/05/2017 1346   LABSPEC 1.009 12/22/2021 1241   PHURINE 5.0 12/22/2021 1241   GLUCOSEU NEGATIVE 12/22/2021 1241   HGBUR NEGATIVE 12/22/2021 1241   BILIRUBINUR NEGATIVE 12/22/2021 1241   BILIRUBINUR Negative 05/05/2017 1346   KETONESUR NEGATIVE 12/22/2021 1241   PROTEINUR NEGATIVE 12/22/2021 1241   UROBILINOGEN negative 02/11/2015 1142   UROBILINOGEN 0.2 09/19/2012 2034   NITRITE NEGATIVE 12/22/2021 1241   LEUKOCYTESUR NEGATIVE 12/22/2021 1241   Sepsis Labs: '@LABRCNTIP'$ (procalcitonin:4,lacticidven:4) ) Recent Results (from the past 240 hour(s))  Resp Panel by RT-PCR (Flu A&B, Covid) Anterior Nasal Swab     Status: None   Collection Time: 12/22/21  9:21 AM   Specimen: Anterior Nasal Swab  Result Value Ref Range Status   SARS Coronavirus 2 by RT PCR NEGATIVE NEGATIVE Final    Comment: (NOTE) SARS-CoV-2  target nucleic acids are NOT DETECTED.  The SARS-CoV-2 RNA is generally detectable in upper respiratory specimens during the acute phase of infection. The lowest concentration of SARS-CoV-2 viral copies this assay can detect is 138 copies/mL. A negative result does not preclude SARS-Cov-2 infection and should not be used as the sole basis for treatment or other patient management decisions. A negative result may occur with  improper specimen collection/handling, submission of specimen other than nasopharyngeal swab, presence of viral mutation(s) within the areas targeted by this assay, and inadequate number of viral copies(<138 copies/mL). A negative result must be combined with clinical observations, patient history, and epidemiological information. The expected result is Negative.  Fact Sheet for Patients:  EntrepreneurPulse.com.au  Fact Sheet for Healthcare Providers:  IncredibleEmployment.be  This test is no t yet approved or  cleared by the [place] FDA and  has been authorized for detection and/or diagnosis of SARS-CoV-2 by FDA under an Emergency Use Authorization (EUA). This EUA will remain  in effect (meaning this test can be used) for the duration of the COVID-19 declaration under Section 564(b)(1) of the Act, 21 U.S.C.section 360bbb-3(b)(1), unless the authorization is terminated  or revoked sooner.       Influenza A by PCR NEGATIVE NEGATIVE Final   Influenza B by PCR NEGATIVE NEGATIVE Final    Comment: (NOTE) The Xpert Xpress SARS-CoV-2/FLU/RSV plus assay is intended as an aid in the diagnosis of influenza from Nasopharyngeal swab specimens and should not be used as a sole basis for treatment. Nasal washings and aspirates are unacceptable for Xpert Xpress SARS-CoV-2/FLU/RSV testing.  Fact Sheet for Patients: EntrepreneurPulse.com.au  Fact Sheet for Healthcare Providers: IncredibleEmployment.be  This test is not yet approved or cleared by the [place] FDA and has been authorized for detection and/or diagnosis of SARS-CoV-2 by FDA under an Emergency Use Authorization (EUA). This EUA will remain in effect (meaning this test can be used) for the duration of the COVID-19 declaration under Section 564(b)(1) of the Act, 21 U.S.C. section 360bbb-3(b)(1), unless the authorization is terminated or revoked.  Performed at [facility], [address] [place], [place] [address]   Culture, blood (routine x 2)     Status: None (Preliminary result)   Collection Time: 12/22/21 10:39 AM   Specimen: BLOOD  Result Value Ref Range Status   Specimen Description BLOOD BLOOD RIGHT HAND  Final   Special Requests NONE  Final   Culture   Final    NO GROWTH 4 DAYS Performed at [facility], [address] [place], [place] [address]    Report Status PENDING  Incomplete  Culture, blood (routine x 2)     Status: None (Preliminary result)   Collection Time: 12/22/21 10:39  AM   Specimen: BLOOD  Result Value Ref Range Status   Specimen Description BLOOD BLOOD LEFT HAND  Final   Special Requests NONE  Final   Culture   Final    NO GROWTH 4 DAYS Performed at [facility], [address] [place], [place] [address]    Report Status PENDING  Incomplete  MRSA Next Gen by PCR, Nasal     Status: None   Collection Time: 12/22/21 12:53 PM   Specimen: Nasal Mucosa; Nasal Swab  Result Value Ref Range Status   MRSA by PCR Next Gen NOT DETECTED NOT DETECTED Final    Comment: (NOTE) The GeneXpert MRSA Assay (FDA approved for NASAL specimens only), is one component of a comprehensive MRSA colonization surveillance program. It is not  intended to diagnose MRSA infection nor to guide or monitor treatment for MRSA infections. Test performance is not FDA approved in patients less than 76 years old. Performed at [facility], [address] [place], [place] [address]      Scheduled Meds:  aspirin  81 mg Oral Daily   Chlorhexidine Gluconate Cloth  6 each Topical Q0600   furosemide  40 mg Oral Daily   heparin  5,000 Units Subcutaneous Q8H   metoprolol succinate  25 mg Oral Daily   mouth rinse  15 mL Mouth Rinse 4 times per day   sodium chloride flush  3 mL Intravenous Q12H   Continuous Infusions:  sodium chloride      ceFAZolin (ANCEF) IV 1 g (12/26/21 0544)    Procedures/Studies: DG CHEST PORT 1 VIEW  Result Date: 12/23/2021 CLINICAL DATA:  [id number] with bilateral pleural effusions and systolic CHF, respiratory distress. EXAM: PORTABLE CHEST 1 VIEW COMPARISON:  Portable chest yesterday at 9:44 a.m. FINDINGS: 4:51 a.m. CABG changes, cardiomegaly and a heavily calcified thoracic aorta are again noted. There is central vascular prominence which appears similar, large right and moderate left pleural effusions and either a mild improvement in the right effusion or redistribution. Overlying atelectasis or airspace disease on the right-greater-than-left appear similar on the  left but improved on the right, with the right lower lung field still obscured by the fluid. The lung fields are otherwise clear. In all other respects there are no further changes. IMPRESSION: 1. Mild improvement in the right pleural effusion versus fluid redistribution. 2. Cardiomegaly with central vascular congestion and right-greater-than-left pleural effusions with overlying atelectasis or airspace disease. 3. Aortic atherosclerosis. Electronically Signed   By: Telford Nab M.D.   On: 12/23/2021 06:09   ECHOCARDIOGRAM COMPLETE  Result Date: 12/22/2021    ECHOCARDIOGRAM REPORT   Patient Name:   Presten Joost. Date of Exam: 12/22/2021 Medical Rec #:  [id number]         Height:       74.0 in Accession #:    [id number]        Weight:       174.8 lb Date of Birth:  [birth year]         BSA:          2.053 m Patient Age:    53 years          BP:           122/79 mmHg Patient Gender: M                 HR:           84 bpm. Exam Location:  [facility] Procedure: 2D Echo, Cardiac Doppler and Color Doppler Indications:    CHF-Acute Systolic W26.37  History:        Patient has prior history of Echocardiogram examinations, most                 recent 02/06/2021. CAD, Prior CABG, Arrythmias:Atrial                 Fibrillation; Risk Factors:Hypertension, Dyslipidemia and Former                 Smoker.  Sonographer:    Alvino Chapel RCS Referring Phys: [phone] Kyjuan Gause IMPRESSIONS  1. Left ventricular ejection fraction, by estimation, is 30 to 35%. The left ventricle has moderately decreased function. The left ventricle has no regional wall motion abnormalities. Left ventricular diastolic function  could not be evaluated. There is the interventricular septum is flattened in systole and diastole, consistent with right ventricular pressure and volume overload. RV volume overload causing low LV filling output.  2. Severe right ventricular enlargement. Severe right ventricular systolic dysfunction.  3. Left atrial size was  severely dilated.  4. Right atrial size was severely dilated.  5. The mitral valve is abnormal. Trivial mitral valve regurgitation. No evidence of mitral stenosis.  6. Tricuspid valve regurgitation is severe. Unable to calculate pulmonary artery systolic pressure due to rapid pressure equilibration between RV and RA from severe TR.  7. The aortic valve is abnormal. There is severe calcifcation of the aortic valve. Aortic valve regurgitation is not visualized. There is low flow-low gradient severe aortic valve stenosis with V max 3.12 m/s, mean PG 24 mm Hg, Ao valve area by continuity equation 0.35 cm2 and SVI 12.7 ml/m2. Cardiology consult is recommended. Comparison(s): Changes from prior study are noted. LVEF decreased to 30-35%, RV function severely reduced and low flow-low gradient severe aortic valve stenosis now. FINDINGS  Left Ventricle: Left ventricular ejection fraction, by estimation, is 30 to 35%. The left ventricle has moderately decreased function. The left ventricle has no regional wall motion abnormalities. Definity contrast agent was given IV to delineate the left ventricular endocardial borders. The left ventricular internal cavity size was normal in size. There is no left ventricular hypertrophy. The interventricular septum is flattened in systole and diastole, consistent with right ventricular pressure and  volume overload. Left ventricular diastolic function could not be evaluated due to nondiagnostic images. Left ventricular diastolic function could not be evaluated. Right Ventricle: Unable to calculate PASP due to rapid pressure equilibration between RV and RA due to severe TR. The right ventricular size is severely enlarged. Right vetricular wall thickness was not well visualized. Right ventricular systolic function is severely reduced. Left Atrium: Left atrial size was severely dilated. Right Atrium: Right atrial size was severely dilated. Pericardium: There is no evidence of pericardial  effusion. Mitral Valve: The mitral valve is abnormal. There is moderate calcification of the mitral valve leaflet(s). Trivial mitral valve regurgitation. No evidence of mitral valve stenosis. Tricuspid Valve: The tricuspid valve is abnormal. Tricuspid valve regurgitation is severe. No evidence of tricuspid stenosis. Aortic Valve: The aortic valve is abnormal. There is severe calcifcation of the aortic valve. There is moderate aortic valve annular calcification. Aortic valve regurgitation is not visualized. Severe aortic stenosis is present. Aortic valve mean gradient measures 23.0 mmHg. Aortic valve peak gradient measures 36.8 mmHg. Aortic valve area, by VTI measures 0.35 cm. Pulmonic Valve: The pulmonic valve was not well visualized. Pulmonic valve regurgitation is not visualized. No evidence of pulmonic stenosis. Aorta: The aortic root is normal in size and structure. Venous: IVC assessment for right atrial pressure unable to be performed due to mechanical ventilation. IAS/Shunts: No atrial level shunt detected by color flow Doppler.  LEFT VENTRICLE PLAX 2D LVIDd:         3.70 cm   Diastology LVIDs:         3.10 cm   LV e' medial:    5.44 cm/s LV PW:         1.20 cm   LV E/e' medial:  24.8 LV IVS:        1.00 cm   LV e' lateral:   7.07 cm/s LVOT diam:     1.90 cm   LV E/e' lateral: 19.1 LV SV:  26 LV SV Index:   13 LVOT Area:     2.84 cm  RIGHT VENTRICLE RV S prime:     6.90 cm/s TAPSE (M-mode): 1.3 cm LEFT ATRIUM              Index        RIGHT ATRIUM           Index LA diam:        4.70 cm  2.29 cm/m   RA Area:     40.70 cm LA Vol (A2C):   91.3 ml  44.48 ml/m  RA Volume:   197.00 ml 95.97 ml/m LA Vol (A4C):   133.0 ml 64.79 ml/m LA Biplane Vol: 113.0 ml 55.05 ml/m  AORTIC VALVE AV Area (Vmax):    0.43 cm AV Area (Vmean):   0.44 cm AV Area (VTI):     0.35 cm AV Vmax:           303.50 cm/s AV Vmean:          224.000 cm/s AV VTI:            0.749 m AV Peak Grad:      36.8 mmHg AV Mean Grad:       23.0 mmHg LVOT Vmax:         45.50 cm/s LVOT Vmean:        34.600 cm/s LVOT VTI:          0.092 m LVOT/AV VTI ratio: 0.12  AORTA Ao Root diam: 3.30 cm MITRAL VALVE                TRICUSPID VALVE MV Area (PHT): 5.02 cm     TR Peak grad:   37.2 mmHg MV Decel Time: 151 msec     TR Vmax:        305.00 cm/s MR Peak grad: 32.0 mmHg MR Mean grad: 20.0 mmHg     SHUNTS MR Vmax:      283.00 cm/s   Systemic VTI:  0.09 m MR Vmean:     209.5 cm/s    Systemic Diam: 1.90 cm MV E velocity: 135.00 cm/s Vishnu Priya Mallipeddi Electronically signed by Lorelee Cover Mallipeddi Signature Date/Time: 12/22/2021/8:30:56 PM    Final    DG Chest Port 1 View  Result Date: 12/22/2021 CLINICAL DATA:  Weakness, respiratory distress EXAM: PORTABLE CHEST 1 VIEW COMPARISON:  02/08/2021 FINDINGS: Large right pleural effusion, increased since prior study. Moderate left pleural effusion, also increased. Increasing airspace disease throughout the right lung and in the left lower lobe. Heart is normal size. Prior CABG. IMPRESSION: Large right pleural effusion and moderate left pleural effusion. Airspace disease throughout the right lung and left lower lobe could reflect atelectasis or infiltrate/pneumonia. Electronically Signed   By: Rolm Baptise M.D.   On: 12/22/2021 09:53   PERIPHERAL VASCULAR CATHETERIZATION  Result Date: 12/13/2021 Images from the original result were not included. Patient name: Kolson Chovanec. MRN: [id number] DOB: 02/27/1931 Sex: male 12/13/2021 Pre-operative Diagnosis: Chronic left lower extremity limb threatening ischemia with toe ulceration Post-operative diagnosis:  Same Surgeon:  Erlene Quan C. Donzetta Matters, MD Procedure Performed: 1.  Ultrasound-guided cannulation right common femoral artery 2.  Aortogram and bilateral lower extremity angiography 3.  Stent of left SFA with 6 x 60 mm Elluvia 4.  mynx device closure right common femoral artery Indications: 88 year old male with chronic bilateral lower extremity threatening  ischemia with ulceration of the left great toe and right medial ankle severe depressed  ABIs.  Motor pressure calculated on the left given the ulceration.  He indicated for angiography with possible intervention. Findings: Aortoiliac segments are heavily calcified although there is no flow-limiting stenosis.  Bilateral renal arteries are patent.  Left common femoral artery heavily calcified with approximate 50% stenosis that was not treated.  Left SFA also heavily calcified there is 1 area in the mid segment with 80% stenosis that was stented to less than 20% residual stenosis with heavy calcification.  Distally on the left there is approximately 40% stenosis of the distal SFA proximal popliteal artery and then below the knee there is no visible anterior tibial artery.  The peroneal artery has initial 50% stenosis within the runoff to the ankle.  Posterior tibial artery initially has runoff within appears to occlude for proximally 2 cm in the distal segment and then reconstitutes into the foot and no intervention was undertaken below the knee.  On the right side the patient has heavily calcified common femoral artery at least 50% stenosis and then heavily calcified SFA and popliteal arteries above the knee and are patent appears to be occluded at the below the knee popliteal artery level with reconstitution of posterior tibial artery. Patient to follow-up in a few weeks to evaluate for wound healing and can consider scheduling right lower extremity angiogram at that time.  Procedure:  The patient was identified in the holding area and taken to room 8.  The patient was then placed supine on the table and prepped and draped in the usual sterile fashion.  A time out was called.  Ultrasound was used to evaluate the right common femoral artery which was calcified.  The area was anesthetized with 1% lidocaine cannulated micropuncture needle followed by wire and sheath.  Images saved the permanent record.  We placed a  Bentson wire followed by 5 [place] sheath performed aortogram.  We then crossed the bifurcation with Bentson wire and Navi cross catheter for left lower extremity angiogram.  With the above findings we elected for treatment.  We placed a Glidewire advantage followed by a long 6 [place] sheath patient was heparinized with 6000 units of heparin.  We then crossed into the distal popliteal artery performed further angiography below the knee to better define the tibial vessels.  We then primarily stented the SFA with a 6 x 60 mm drug-eluting stent and postdilated this with 5 mm balloon to 0% residual stenosis.  At this time we retracted the sheath into the right external iliac artery and perform right lower extremity angiography which was limited by patient's contracture of his right knee and motion degradation.  We then exchanged for a short 6 French sheath over the Glidewire advantage and then deployed a minx device.  He tolerated the procedure without any complication. Contrast: 90 cc Brandon C. Donzetta Matters, MD [facility] Office: [phone] Pager: [phone]   VAS [place] ABI WITH/WO TBI  Result Date: 12/08/2021  LOWER EXTREMITY DOPPLER STUDY Patient Name:  ORRIN YURKOVICH.  Date of Exam:   12/08/2021 Medical Rec #: [id number]          Accession #:    [id number] Date of Birth: Apr 15, 1931          Patient Gender: M Patient Age:   49 years Exam Location:  [facility] Procedure:      VAS [place] ABI WITH/WO TBI Referring Phys: CHRISTOPHER DICKSON --------------------------------------------------------------------------------  Indications: Ulceration, and peripheral artery disease. High Risk Factors: Hypertension, hyperlipidemia.  Comparison Study: No prior ABI                   12/03/21 US Arterial lower extremity duplex left- Abnormal                   monophasic arterial waveforms throughout the left lower                   extremity arterial tree. Findings suggest proximal  inflow                   (aortoiliac) occlusive disease. Performing Technologist: Maudry Mayhew MHA, RVT, RDCS, RDMS  Examination Guidelines: A complete evaluation includes at minimum, Doppler waveform signals and systolic blood pressure reading at the level of bilateral brachial, anterior tibial, and posterior tibial arteries, when vessel segments are accessible. Bilateral testing is considered an integral part of a complete examination. Photoelectric Plethysmograph (PPG) waveforms and toe systolic pressure readings are included as required and additional duplex testing as needed. Limited examinations for reoccurring indications may be performed as noted.  ABI Findings: +---------+------------------+-----+----------+--------+ Right    Rt Pressure (mmHg)IndexWaveform  Comment  +---------+------------------+-----+----------+--------+ Brachial 133                                       +---------+------------------+-----+----------+--------+ PTA      Noncompressible        monophasic         +---------+------------------+-----+----------+--------+ DP                              absent             +---------+------------------+-----+----------+--------+ Great Toe0                 0.00 absent             +---------+------------------+-----+----------+--------+ +---------+------------------+-----+-------------------+-----------------------+ Left     Lt Pressure (mmHg)IndexWaveform           Comment                 +---------+------------------+-----+-------------------+-----------------------+ Brachial 143                                                               +---------+------------------+-----+-------------------+-----------------------+ PTA      Noncompressible        monophasic                                 +---------+------------------+-----+-------------------+-----------------------+ DP       Noncompressible        dampened monophasic                         +---------+------------------+-----+-------------------+-----------------------+ Great Toe                       Abnormal           Unable to obtain  pressure secondary to                                                      ulcer location          +---------+------------------+-----+-------------------+-----------------------+ +-------+---------------+-----------+------------+------------+ ABI/TBIToday's ABI    Today's TBIPrevious ABIPrevious TBI +-------+---------------+-----------+------------+------------+ Right  Noncompressible0.00                                +-------+---------------+-----------+------------+------------+ Left   Noncompressible                                    +-------+---------------+-----------+------------+------------+  Summary: Right: Resting right ankle-brachial index indicates noncompressible right lower extremity arteries. Absent right great toe dorsalis pedis artery waveform, absent right great toe PPG waveform. Left: Resting left ankle-brachial index indicates noncompressible left lower extremity arteries. Left great toe PPG waveform is pressent and abnormal, however could not obtain pressure secondary to ulcer location. *See table(s) above for measurements and observations.  Electronically signed by Servando Snare MD on 12/08/2021 at 4:07:10 PM.    Final    [place] Lower Ext Art Left  Result Date: 12/03/2021 CLINICAL DATA:  Left lower extremity wounds EXAM: LEFT LOWER EXTREMITY ARTERIAL DUPLEX SCAN TECHNIQUE: Gray-scale sonography as well as color Doppler and duplex ultrasound was performed to evaluate the lower extremity arteries including the common, superficial and profunda femoral arteries, popliteal artery and calf arteries. COMPARISON:  None Available. FINDINGS: Left lower Extremity Inflow: Abnormal monophasic arterial waveforms. Heterogeneous atherosclerotic plaque.  Outflow: Abnormal monophasic arterial waveforms throughout. Runoff: Abnormal monophasic arterial waveforms with diminished flow. IMPRESSION: Abnormal monophasic arterial waveforms throughout the left lower extremity arterial tree. 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 Electronically Signed   By: Jacqulynn Cadet M.D.   On: 12/03/2021 16:23   DG Foot Complete Left  Result Date: 12/03/2021 CLINICAL DATA:  Left great toe wound EXAM: LEFT FOOT - COMPLETE 3+ VIEW COMPARISON:  Ankle radiographs 02/22/2018 FINDINGS: Bony demineralization. Extensive atherosclerotic vascular calcifications. No bony destructive findings characteristic of active osteomyelitis. No gas tracking in the soft tissues is identified. Dorsal soft tissue swelling along the forefoot. Moderate central collapse of the calcaneus related to prior fracture. Cannot exclude pes planus. Plantar and Achilles calcaneal spurs. IMPRESSION: 1. No bony destructive findings characteristic of active osteomyelitis. 2. Dorsal soft tissue swelling along the forefoot. 3. Bony demineralization. 4. Plantar and Achilles calcaneal spurs. 5. Moderate central collapse of the calcaneus related to prior fracture. Electronically Signed   By: Van Clines M.D.   On: 12/03/2021 14:26   DG Hand Complete Left  Result Date: 12/03/2021 CLINICAL DATA:  Wound of the great toe and wound of the left hand EXAM: LEFT HAND - COMPLETE 3+ VIEW COMPARISON:  None Available. FINDINGS: Substantially abnormal distal phalanx middle finger with poor definition of normal cortical boundaries and extensive amorphous fragmentation. Cannot exclude underlying lucent expansile destructive lesion or osteomyelitis. Remote severe trauma or insult could possibly cause a similar appearance, correlate with patient history and symptoms involving the distal middle finger. Faint linear calcifications in the soft tissues of the ring and index fingers along the ulnar side of  the middle phalanges. The fingers are mildly flexed during imaging, which  affects diagnostic accuracy in assessing the phalanges. Substantial arterial atherosclerosis. Mild radial sided spurring of the head of the index finger metacarpal. Mild degenerative findings at the thumb and index finger MCP joints. IMPRESSION: 1. Substantially abnormal appearance of the distal phalanx of the middle finger with extensive amorphous fragmentation and poor definition of the cortical boundaries. Cannot exclude underlying lucent expansile destructive lesion or osteomyelitis. Remote severe trauma or insult could possibly cause a similar appearance, correlate with patient history and symptoms. 2. Substantial arterial atherosclerosis. 3. Mild degenerative findings at the thumb and index finger MCP joints. Electronically Signed   By: Van Clines M.D.   On: 12/03/2021 14:23    Orson Eva, DO  [facility]  If 7PM-7AM, please contact night-coverage [email or web address] Password [email or web address] 12/26/2021, 4:45 PM   LOS: 4 days

## 2021-12-26 NOTE — Progress Notes (Incomplete)
Patient brought up to unit made comfortable in chair. Foley removed at 1345 tolerated well by ICU RN. @ 1282 patient voided maybe 15 cc into canister/ male wick. Abdomen looks distended and is tight no complaints of pain per patient. Will continue to monitor. MD informed.

## 2021-12-27 DIAGNOSIS — L03115 Cellulitis of right lower limb: Secondary | ICD-10-CM | POA: Diagnosis not present

## 2021-12-27 DIAGNOSIS — J9601 Acute respiratory failure with hypoxia: Secondary | ICD-10-CM | POA: Diagnosis not present

## 2021-12-27 DIAGNOSIS — I5023 Acute on chronic systolic (congestive) heart failure: Secondary | ICD-10-CM | POA: Diagnosis not present

## 2021-12-27 DIAGNOSIS — I10 Essential (primary) hypertension: Secondary | ICD-10-CM | POA: Diagnosis not present

## 2021-12-27 LAB — BASIC METABOLIC PANEL
Anion gap: 7 (ref 5–15)
BUN: 20 mg/dL (ref 8–23)
CO2: 33 mmol/L — ABNORMAL HIGH (ref 22–32)
Calcium: 8.1 mg/dL — ABNORMAL LOW (ref 8.9–10.3)
Chloride: 90 mmol/L — ABNORMAL LOW (ref 98–111)
Creatinine, Ser: 0.75 mg/dL (ref 0.61–1.24)
GFR, Estimated: >60 mL/min (ref >60)
Glucose, Bld: 100 mg/dL — ABNORMAL HIGH (ref 70–99)
Potassium: 3.9 mmol/L (ref 3.5–5.1)
Sodium: 130 mmol/L — ABNORMAL LOW (ref 135–145)

## 2021-12-27 LAB — CULTURE, BLOOD (ROUTINE X 2)
Culture: NO GROWTH
Culture: NO GROWTH

## 2021-12-27 MED ORDER — CEPHALEXIN 500 MG PO CAPS: 500.0000 mg | ORAL_CAPSULE | Freq: Four times a day (QID) | ORAL | 0 refills | Status: AC

## 2021-12-27 MED ORDER — FUROSEMIDE 40 MG PO TABS: 40.0000 mg | ORAL_TABLET | Freq: Every day | ORAL | 1 refills | Status: AC

## 2021-12-27 NOTE — Progress Notes (Signed)
Pt has not voided this shift, states he has not had anything to drink and does not have the urge to go. Pt given ginger ale to drink. Bladder scan revealed 148 ml. Pt attempted to void, small amount of yellow urine noted in malewick, but not enough to be suctioned to canister. Pt States "that's all I got". Denies any pain, pressure of feeling full. Will continue to monitor output.

## 2021-12-27 NOTE — Progress Notes (Addendum)
Pt still has not voided this shift. Bladder scan now shows >200. In/out catheter attempted and was unsuccessful. Pt has gross amount of edema to penis and scrotum, unable to retract foreskin. Dr. Josephine Cables was notified. Requested order for urology consult as well as a foley catheter. Pt has a catheter previously, but was removed prior to being transferred from ICU. NNO at this time. Was instructed by MD to signoff to oncoming nurse to ensure this gets fixed on day shift.

## 2021-12-27 NOTE — Discharge Summary (Signed)
Physician Discharge Summary   Patient: Danny [name]. MRN: [id number] DOB: 1931-05-03  Admit date:     12/22/2021  Discharge date: 12/27/21  Discharge Physician: Shanon Brow Kervin Bones   PCP: Baruch Gouty, FNP   Recommendations at discharge:   Please follow up with primary care provider within 1-2 weeks  Please repeat BMP and CBC in one week  Please follow up on/with      Hospital Course: 88 year old with a history of dementia, persistent atrial fibrillation, coronary artery disease, hypertension, hyperlipidemia, pulmonary hypertension, aortic stenosis, peripheral vascular disease presenting with 2 to 3-day history of progressive generalized weakness and shortness of breath.  At baseline, the patient is normally able to initiate transfer to his wheelchair with which he normally gets around.  However in the last 24 hours he has not been able to go to bed.  He is also had increasing shortness of breath, increasing lower extremity edema and increasing abdominal girth.  Patient himself is a poor historian.  History is supplemented from the patient's niece at the bedside.  He is in the states that he recently had a stent in his left SFA on 12/13/2021.  She states that "he has gone downhill since then".  She also states that she has noted worsening cognitive and functional decline over the last 10 months. There is been no fevers, chills, chest pain, nausea, vomiting, hemoptysis.  He has had some loose stools without hematochezia or melena. In the ED, the patient was afebrile and hemodynamically stable.  He was noted to have oxygen saturation of 75% room air.  He was subsequent placed on BiPAP.  He was given furosemide 40 mg IV x 1 and started on vancomycin and cefepime.  Chest x-ray showed bilateral pleural effusion, right greater than left.  There is left lower lobe infiltrate vs atelectasis.  WBC 7.1, hemoglobin 14.7, platelets 154,000.  BNP was 1048.  Lactate 3.2.  INR 1.5.  Assessment and Plan: Acute  respiratory failure with hypoxia and hypercarbia -Secondary to pleural effusion and CHF -Initially on BiPAP -now stable on 2L>>weaned to RA with saturation 96-98%   Acute on chronic systolic CHF -Biventricular failure -initially on IV furosemide 40 mg twice daily -transitioned to lasix 40 mg po daily 12/3 -Daily weights -Accurate I's and O's--Neg 8.5L -02/06/2021 echo EF 40-45%, severe TR, moderate AS, RV overload -12/12/21 Echo EF 30-35%, severe RV dysfxn, severe TR -appreciate cardiology -12/2 ReDS vest--29% -d/c weight 168.6  Acute urinary retention -foley discontinued 12/3>>pt had difficulty urinating -foley replaced by Dr. Alyson Ingles on 12/27/21 -penis and scrotum edematous>>difficult insertion -d/c home with foley -follow up with urology for voiding trial after d/c   Peripheral vascular disease -Status post left SFA stent 12/13/2021 -Continue aspirin   Cellulitis right leg -continue cefazolin>>transition to cephalexin x 3 more days -improved   Persistent atrial fibrillation -no longer on warfarin due to frailty and falls -restarted metoprolol succinate -rate controlled   Essential HTN -BPs soft with diuresis   Severe Aortic Stenosis -he is frail and not a candidate for intervention.    Major neurocognitive disorder -at risk for hospital delirium -not on therapy outpatient   Coronary Artery Disease -not a candidate for aggressive intervention -1/18.23 cath---Left main ostial occlusion; Nonobstructive disease in the RCA; patent grafts  -no chest pain presently -continue ASA   Hypokalemia -repleted -check mag--1.7>>2.1 -continue daily KCl   GOC -appreciate palliative consult -plan to optimize as much as possible and d/c home with hospice care if spouse can  provide care and/or care can be supplemented -pt and family refuse SNF>>they will go home with hospice.  They are paying out of pocket for personal care services at home      Consultants:  cardiology Procedures performed: none  Disposition: Home Diet recommendation:  Regular diet DISCHARGE MEDICATION: Allergies as of 12/27/2021       Reactions   Doxycycline Diarrhea   Contrast Media [iodinated Contrast Media]    Syncope   Lunesta [eszopiclone]    "felt WILD"   Morphine And Related Nausea And Vomiting   Trazodone And Nefazodone Other (See Comments)   Extremely drowsy the next day after taking.        Medication List     STOP taking these medications    metolazone 2.5 MG tablet Commonly known as: ZAROXOLYN       TAKE these medications    acetaminophen 325 MG tablet Commonly known as: TYLENOL Take 650 mg by mouth every 6 (six) hours as needed.   aspirin 81 MG chewable tablet Chew 81 mg by mouth daily. atrial fibrillation   cephALEXin 500 MG capsule Commonly known as: KEFLEX Take 1 capsule (500 mg total) by mouth 4 (four) times daily.   cholecalciferol 25 MCG (1000 UNIT) tablet Commonly known as: VITAMIN D3 Take 1,000 Units by mouth daily.   furosemide 40 MG tablet Commonly known as: LASIX Take 1 tablet (40 mg total) by mouth daily. Start taking on: December 28, 2021 What changed:  medication strength how much to take   levocetirizine 5 MG tablet Commonly known as: XYZAL TAKE 1 TABLET EVERY EVENING   metoprolol succinate 25 MG 24 hr tablet Commonly known as: TOPROL-XL Take 1 tablet (25 mg total) by mouth daily.   omeprazole 20 MG capsule Commonly known as: PRILOSEC Take 1 capsule (20 mg total) by mouth 2 (two) times daily before a meal.   potassium chloride SA 20 MEQ tablet Commonly known as: KLOR-CON M Take 2 tablets (40 mEq total) by mouth daily.        Discharge Exam: Filed Weights   12/25/21 0429 12/26/21 0338 12/27/21 0520  Weight: 77.3 kg 77.3 kg 76.5 kg   HEENT:  [place]/AT, No thrush, no icterus CV:  IRRR, no rub, no S3, no S4 Lung:  fine bibasilar crackles. No wheeze Abd:  soft/+BS, NT Ext:  No edema, no lymphangitis,  no synovitis, no rash   Condition at discharge: stable  The results of significant diagnostics from this hospitalization (including imaging, microbiology, ancillary and laboratory) are listed below for reference.   Imaging Studies: DG CHEST PORT 1 VIEW  Result Date: 12/23/2021 CLINICAL DATA:  [id number] with bilateral pleural effusions and systolic CHF, respiratory distress. EXAM: PORTABLE CHEST 1 VIEW COMPARISON:  Portable chest yesterday at 9:44 a.m. FINDINGS: 4:51 a.m. CABG changes, cardiomegaly and a heavily calcified thoracic aorta are again noted. There is central vascular prominence which appears similar, large right and moderate left pleural effusions and either a mild improvement in the right effusion or redistribution. Overlying atelectasis or airspace disease on the right-greater-than-left appear similar on the left but improved on the right, with the right lower lung field still obscured by the fluid. The lung fields are otherwise clear. In all other respects there are no further changes. IMPRESSION: 1. Mild improvement in the right pleural effusion versus fluid redistribution. 2. Cardiomegaly with central vascular congestion and right-greater-than-left pleural effusions with overlying atelectasis or airspace disease. 3. Aortic atherosclerosis. Electronically Signed   By: Telford Nab  M.D.   On: 12/23/2021 06:09   ECHOCARDIOGRAM COMPLETE  Result Date: 12/22/2021    ECHOCARDIOGRAM REPORT   Patient Name:   Preet Perrier. Date of Exam: 12/22/2021 Medical Rec #:  [id number]         Height:       74.0 in Accession #:    [id number]        Weight:       174.8 lb Date of Birth:  1931-10-16         BSA:          2.053 m Patient Age:    7 years          BP:           122/79 mmHg Patient Gender: M                 HR:           84 bpm. Exam Location:  [facility] Procedure: 2D Echo, Cardiac Doppler and Color Doppler Indications:    CHF-Acute Systolic Z56.38  History:        Patient has prior history  of Echocardiogram examinations, most                 recent 02/06/2021. CAD, Prior CABG, Arrythmias:Atrial                 Fibrillation; Risk Factors:Hypertension, Dyslipidemia and Former                 Smoker.  Sonographer:    Alvino Chapel RCS Referring Phys: [phone] Talah Cookston IMPRESSIONS  1. Left ventricular ejection fraction, by estimation, is 30 to 35%. The left ventricle has moderately decreased function. The left ventricle has no regional wall motion abnormalities. Left ventricular diastolic function could not be evaluated. There is the interventricular septum is flattened in systole and diastole, consistent with right ventricular pressure and volume overload. RV volume overload causing low LV filling output.  2. Severe right ventricular enlargement. Severe right ventricular systolic dysfunction.  3. Left atrial size was severely dilated.  4. Right atrial size was severely dilated.  5. The mitral valve is abnormal. Trivial mitral valve regurgitation. No evidence of mitral stenosis.  6. Tricuspid valve regurgitation is severe. Unable to calculate pulmonary artery systolic pressure due to rapid pressure equilibration between RV and RA from severe TR.  7. The aortic valve is abnormal. There is severe calcifcation of the aortic valve. Aortic valve regurgitation is not visualized. There is low flow-low gradient severe aortic valve stenosis with V max 3.12 m/s, mean PG 24 mm Hg, Ao valve area by continuity equation 0.35 cm2 and SVI 12.7 ml/m2. Cardiology consult is recommended. Comparison(s): Changes from prior study are noted. LVEF decreased to 30-35%, RV function severely reduced and low flow-low gradient severe aortic valve stenosis now. FINDINGS  Left Ventricle: Left ventricular ejection fraction, by estimation, is 30 to 35%. The left ventricle has moderately decreased function. The left ventricle has no regional wall motion abnormalities. Definity contrast agent was given IV to delineate the left ventricular  endocardial borders. The left ventricular internal cavity size was normal in size. There is no left ventricular hypertrophy. The interventricular septum is flattened in systole and diastole, consistent with right ventricular pressure and  volume overload. Left ventricular diastolic function could not be evaluated due to nondiagnostic images. Left ventricular diastolic function could not be evaluated. Right Ventricle: Unable to calculate PASP due to rapid pressure equilibration between RV and  RA due to severe TR. The right ventricular size is severely enlarged. Right vetricular wall thickness was not well visualized. Right ventricular systolic function is severely reduced. Left Atrium: Left atrial size was severely dilated. Right Atrium: Right atrial size was severely dilated. Pericardium: There is no evidence of pericardial effusion. Mitral Valve: The mitral valve is abnormal. There is moderate calcification of the mitral valve leaflet(s). Trivial mitral valve regurgitation. No evidence of mitral valve stenosis. Tricuspid Valve: The tricuspid valve is abnormal. Tricuspid valve regurgitation is severe. No evidence of tricuspid stenosis. Aortic Valve: The aortic valve is abnormal. There is severe calcifcation of the aortic valve. There is moderate aortic valve annular calcification. Aortic valve regurgitation is not visualized. Severe aortic stenosis is present. Aortic valve mean gradient measures 23.0 mmHg. Aortic valve peak gradient measures 36.8 mmHg. Aortic valve area, by VTI measures 0.35 cm. Pulmonic Valve: The pulmonic valve was not well visualized. Pulmonic valve regurgitation is not visualized. No evidence of pulmonic stenosis. Aorta: The aortic root is normal in size and structure. Venous: IVC assessment for right atrial pressure unable to be performed due to mechanical ventilation. IAS/Shunts: No atrial level shunt detected by color flow Doppler.  LEFT VENTRICLE PLAX 2D LVIDd:         3.70 cm   Diastology  LVIDs:         3.10 cm   LV e' medial:    5.44 cm/s LV PW:         1.20 cm   LV E/e' medial:  24.8 LV IVS:        1.00 cm   LV e' lateral:   7.07 cm/s LVOT diam:     1.90 cm   LV E/e' lateral: 19.1 LV SV:         26 LV SV Index:   13 LVOT Area:     2.84 cm  RIGHT VENTRICLE RV S prime:     6.90 cm/s TAPSE (M-mode): 1.3 cm LEFT ATRIUM              Index        RIGHT ATRIUM           Index LA diam:        4.70 cm  2.29 cm/m   RA Area:     40.70 cm LA Vol (A2C):   91.3 ml  44.48 ml/m  RA Volume:   197.00 ml 95.97 ml/m LA Vol (A4C):   133.0 ml 64.79 ml/m LA Biplane Vol: 113.0 ml 55.05 ml/m  AORTIC VALVE AV Area (Vmax):    0.43 cm AV Area (Vmean):   0.44 cm AV Area (VTI):     0.35 cm AV Vmax:           303.50 cm/s AV Vmean:          224.000 cm/s AV VTI:            0.749 m AV Peak Grad:      36.8 mmHg AV Mean Grad:      23.0 mmHg LVOT Vmax:         45.50 cm/s LVOT Vmean:        34.600 cm/s LVOT VTI:          0.092 m LVOT/AV VTI ratio: 0.12  AORTA Ao Root diam: 3.30 cm MITRAL VALVE                TRICUSPID VALVE MV Area (PHT): 5.02 cm     TR Peak  grad:   37.2 mmHg MV Decel Time: 151 msec     TR Vmax:        305.00 cm/s MR Peak grad: 32.0 mmHg MR Mean grad: 20.0 mmHg     SHUNTS MR Vmax:      283.00 cm/s   Systemic VTI:  0.09 m MR Vmean:     209.5 cm/s    Systemic Diam: 1.90 cm MV E velocity: 135.00 cm/s Vishnu Priya Mallipeddi Electronically signed by Lorelee Cover Mallipeddi Signature Date/Time: 12/22/2021/8:30:56 PM    Final    DG Chest Port 1 View  Result Date: 12/22/2021 CLINICAL DATA:  Weakness, respiratory distress EXAM: PORTABLE CHEST 1 VIEW COMPARISON:  02/08/2021 FINDINGS: Large right pleural effusion, increased since prior study. Moderate left pleural effusion, also increased. Increasing airspace disease throughout the right lung and in the left lower lobe. Heart is normal size. Prior CABG. IMPRESSION: Large right pleural effusion and moderate left pleural effusion. Airspace disease throughout the  right lung and left lower lobe could reflect atelectasis or infiltrate/pneumonia. Electronically Signed   By: Rolm Baptise M.D.   On: 12/22/2021 09:53   PERIPHERAL VASCULAR CATHETERIZATION  Result Date: 12/13/2021 Images from the original result were not included. Patient name: Kaeden Mester. MRN: [id number] DOB: Mar 26, 1931 Sex: male 12/13/2021 Pre-operative Diagnosis: Chronic left lower extremity limb threatening ischemia with toe ulceration Post-operative diagnosis:  Same Surgeon:  Erlene Quan C. Donzetta Matters, MD Procedure Performed: 1.  Ultrasound-guided cannulation right common femoral artery 2.  Aortogram and bilateral lower extremity angiography 3.  Stent of left SFA with 6 x 60 mm Elluvia 4.  mynx device closure right common femoral artery Indications: 88 year old male with chronic bilateral lower extremity threatening ischemia with ulceration of the left great toe and right medial ankle severe depressed ABIs.  Motor pressure calculated on the left given the ulceration.  He indicated for angiography with possible intervention. Findings: Aortoiliac segments are heavily calcified although there is no flow-limiting stenosis.  Bilateral renal arteries are patent.  Left common femoral artery heavily calcified with approximate 50% stenosis that was not treated.  Left SFA also heavily calcified there is 1 area in the mid segment with 80% stenosis that was stented to less than 20% residual stenosis with heavy calcification.  Distally on the left there is approximately 40% stenosis of the distal SFA proximal popliteal artery and then below the knee there is no visible anterior tibial artery.  The peroneal artery has initial 50% stenosis within the runoff to the ankle.  Posterior tibial artery initially has runoff within appears to occlude for proximally 2 cm in the distal segment and then reconstitutes into the foot and no intervention was undertaken below the knee.  On the right side the patient has heavily calcified  common femoral artery at least 50% stenosis and then heavily calcified SFA and popliteal arteries above the knee and are patent appears to be occluded at the below the knee popliteal artery level with reconstitution of posterior tibial artery. Patient to follow-up in a few weeks to evaluate for wound healing and can consider scheduling right lower extremity angiogram at that time.  Procedure:  The patient was identified in the holding area and taken to room 8.  The patient was then placed supine on the table and prepped and draped in the usual sterile fashion.  A time out was called.  Ultrasound was used to evaluate the right common femoral artery which was calcified.  The area was anesthetized with 1% lidocaine  cannulated micropuncture needle followed by wire and sheath.  Images saved the permanent record.  We placed a Bentson wire followed by 5 [place] sheath performed aortogram.  We then crossed the bifurcation with Bentson wire and Navi cross catheter for left lower extremity angiogram.  With the above findings we elected for treatment.  We placed a Glidewire advantage followed by a long 6 [place] sheath patient was heparinized with 6000 units of heparin.  We then crossed into the distal popliteal artery performed further angiography below the knee to better define the tibial vessels.  We then primarily stented the SFA with a 6 x 60 mm drug-eluting stent and postdilated this with 5 mm balloon to 0% residual stenosis.  At this time we retracted the sheath into the right external iliac artery and perform right lower extremity angiography which was limited by patient's contracture of his right knee and motion degradation.  We then exchanged for a short 6 French sheath over the Glidewire advantage and then deployed a minx device.  He tolerated the procedure without any complication. Contrast: 90 cc Brandon C. Donzetta Matters, MD [facility] Office: [phone] Pager: [phone]   VAS [place] ABI  WITH/WO TBI  Result Date: 12/08/2021  LOWER EXTREMITY DOPPLER STUDY Patient Name:  CANTRELL LAROUCHE.  Date of Exam:   12/08/2021 Medical Rec #: [id number]          Accession #:    [id number] Date of Birth: 15-Dec-1931          Patient Gender: M Patient Age:   74 years Exam Location:  [facility] Procedure:      VAS [place] ABI WITH/WO TBI Referring Phys: CHRISTOPHER DICKSON --------------------------------------------------------------------------------  Indications: Ulceration, and peripheral artery disease. High Risk Factors: Hypertension, hyperlipidemia.  Comparison Study: No prior ABI                   12/03/21 US Arterial lower extremity duplex left- Abnormal                   monophasic arterial waveforms throughout the left lower                   extremity arterial tree. Findings suggest proximal inflow                   (aortoiliac) occlusive disease. Performing Technologist: Maudry Mayhew MHA, RVT, RDCS, RDMS  Examination Guidelines: A complete evaluation includes at minimum, Doppler waveform signals and systolic blood pressure reading at the level of bilateral brachial, anterior tibial, and posterior tibial arteries, when vessel segments are accessible. Bilateral testing is considered an integral part of a complete examination. Photoelectric Plethysmograph (PPG) waveforms and toe systolic pressure readings are included as required and additional duplex testing as needed. Limited examinations for reoccurring indications may be performed as noted.  ABI Findings: +---------+------------------+-----+----------+--------+ Right    Rt Pressure (mmHg)IndexWaveform  Comment  +---------+------------------+-----+----------+--------+ Brachial 133                                       +---------+------------------+-----+----------+--------+ PTA      Noncompressible        monophasic         +---------+------------------+-----+----------+--------+ DP  absent             +---------+------------------+-----+----------+--------+ Great Toe0                 0.00 absent             +---------+------------------+-----+----------+--------+ +---------+------------------+-----+-------------------+-----------------------+ Left     Lt Pressure (mmHg)IndexWaveform           Comment                 +---------+------------------+-----+-------------------+-----------------------+ Brachial 143                                                               +---------+------------------+-----+-------------------+-----------------------+ PTA      Noncompressible        monophasic                                 +---------+------------------+-----+-------------------+-----------------------+ DP       Noncompressible        dampened monophasic                        +---------+------------------+-----+-------------------+-----------------------+ Great Toe                       Abnormal           Unable to obtain                                                           pressure secondary to                                                      ulcer location          +---------+------------------+-----+-------------------+-----------------------+ +-------+---------------+-----------+------------+------------+ ABI/TBIToday's ABI    Today's TBIPrevious ABIPrevious TBI +-------+---------------+-----------+------------+------------+ Right  Noncompressible0.00                                +-------+---------------+-----------+------------+------------+ Left   Noncompressible                                    +-------+---------------+-----------+------------+------------+  Summary: Right: Resting right ankle-brachial index indicates noncompressible right lower extremity arteries. Absent right great toe dorsalis pedis artery waveform, absent right great toe PPG waveform. Left: Resting left ankle-brachial index indicates  noncompressible left lower extremity arteries. Left great toe PPG waveform is pressent and abnormal, however could not obtain pressure secondary to ulcer location. *See table(s) above for measurements and observations.  Electronically signed by Servando Snare MD on 12/08/2021 at 4:07:10 PM.    Final    [place] Lower Ext Art Left  Result Date: 12/03/2021 CLINICAL DATA:  Left lower extremity wounds EXAM: LEFT LOWER EXTREMITY ARTERIAL DUPLEX SCAN TECHNIQUE: Gray-scale sonography as well as  color Doppler and duplex ultrasound was performed to evaluate the lower extremity arteries including the common, superficial and profunda femoral arteries, popliteal artery and calf arteries. COMPARISON:  None Available. FINDINGS: Left lower Extremity Inflow: Abnormal monophasic arterial waveforms. Heterogeneous atherosclerotic plaque. Outflow: Abnormal monophasic arterial waveforms throughout. Runoff: Abnormal monophasic arterial waveforms with diminished flow. IMPRESSION: Abnormal monophasic arterial waveforms throughout the left lower extremity arterial tree. 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 Electronically Signed   By: Jacqulynn Cadet M.D.   On: 12/03/2021 16:23   DG Foot Complete Left  Result Date: 12/03/2021 CLINICAL DATA:  Left great toe wound EXAM: LEFT FOOT - COMPLETE 3+ VIEW COMPARISON:  Ankle radiographs 02/22/2018 FINDINGS: Bony demineralization. Extensive atherosclerotic vascular calcifications. No bony destructive findings characteristic of active osteomyelitis. No gas tracking in the soft tissues is identified. Dorsal soft tissue swelling along the forefoot. Moderate central collapse of the calcaneus related to prior fracture. Cannot exclude pes planus. Plantar and Achilles calcaneal spurs. IMPRESSION: 1. No bony destructive findings characteristic of active osteomyelitis. 2. Dorsal soft tissue swelling along the forefoot. 3. Bony demineralization. 4. Plantar and Achilles calcaneal  spurs. 5. Moderate central collapse of the calcaneus related to prior fracture. Electronically Signed   By: Van Clines M.D.   On: 12/03/2021 14:26   DG Hand Complete Left  Result Date: 12/03/2021 CLINICAL DATA:  Wound of the great toe and wound of the left hand EXAM: LEFT HAND - COMPLETE 3+ VIEW COMPARISON:  None Available. FINDINGS: Substantially abnormal distal phalanx middle finger with poor definition of normal cortical boundaries and extensive amorphous fragmentation. Cannot exclude underlying lucent expansile destructive lesion or osteomyelitis. Remote severe trauma or insult could possibly cause a similar appearance, correlate with patient history and symptoms involving the distal middle finger. Faint linear calcifications in the soft tissues of the ring and index fingers along the ulnar side of the middle phalanges. The fingers are mildly flexed during imaging, which affects diagnostic accuracy in assessing the phalanges. Substantial arterial atherosclerosis. Mild radial sided spurring of the head of the index finger metacarpal. Mild degenerative findings at the thumb and index finger MCP joints. IMPRESSION: 1. Substantially abnormal appearance of the distal phalanx of the middle finger with extensive amorphous fragmentation and poor definition of the cortical boundaries. Cannot exclude underlying lucent expansile destructive lesion or osteomyelitis. Remote severe trauma or insult could possibly cause a similar appearance, correlate with patient history and symptoms. 2. Substantial arterial atherosclerosis. 3. Mild degenerative findings at the thumb and index finger MCP joints. Electronically Signed   By: Van Clines M.D.   On: 12/03/2021 14:23    Microbiology: Results for orders placed or performed during the hospital encounter of 12/22/21  Resp Panel by RT-PCR (Flu A&B, Covid) Anterior Nasal Swab     Status: None   Collection Time: 12/22/21  9:21 AM   Specimen: Anterior Nasal Swab   Result Value Ref Range Status   SARS Coronavirus 2 by RT PCR NEGATIVE NEGATIVE Final    Comment: (NOTE) SARS-CoV-2 target nucleic acids are NOT DETECTED.  The SARS-CoV-2 RNA is generally detectable in upper respiratory specimens during the acute phase of infection. The lowest concentration of SARS-CoV-2 viral copies this assay can detect is 138 copies/mL. A negative result does not preclude SARS-Cov-2 infection and should not be used as the sole basis for treatment or other patient management decisions. A negative result may occur with  improper specimen collection/handling, submission of specimen other than nasopharyngeal swab, presence of viral mutation(s)  within the areas targeted by this assay, and inadequate number of viral copies(<138 copies/mL). A negative result must be combined with clinical observations, patient history, and epidemiological information. The expected result is Negative.  Fact Sheet for Patients:  EntrepreneurPulse.com.au  Fact Sheet for Healthcare Providers:  IncredibleEmployment.be  This test is no t yet approved or cleared by the [place] FDA and  has been authorized for detection and/or diagnosis of SARS-CoV-2 by FDA under an Emergency Use Authorization (EUA). This EUA will remain  in effect (meaning this test can be used) for the duration of the COVID-19 declaration under Section 564(b)(1) of the Act, 21 U.S.C.section 360bbb-3(b)(1), unless the authorization is terminated  or revoked sooner.       Influenza A by PCR NEGATIVE NEGATIVE Final   Influenza B by PCR NEGATIVE NEGATIVE Final    Comment: (NOTE) The Xpert Xpress SARS-CoV-2/FLU/RSV plus assay is intended as an aid in the diagnosis of influenza from Nasopharyngeal swab specimens and should not be used as a sole basis for treatment. Nasal washings and aspirates are unacceptable for Xpert Xpress SARS-CoV-2/FLU/RSV testing.  Fact Sheet for  Patients: EntrepreneurPulse.com.au  Fact Sheet for Healthcare Providers: IncredibleEmployment.be  This test is not yet approved or cleared by the [place] FDA and has been authorized for detection and/or diagnosis of SARS-CoV-2 by FDA under an Emergency Use Authorization (EUA). This EUA will remain in effect (meaning this test can be used) for the duration of the COVID-19 declaration under Section 564(b)(1) of the Act, 21 U.S.C. section 360bbb-3(b)(1), unless the authorization is terminated or revoked.  Performed at [facility], [address], [place], [place] [address]   Culture, blood (routine x 2)     Status: None   Collection Time: 12/22/21 10:39 AM   Specimen: BLOOD  Result Value Ref Range Status   Specimen Description BLOOD BLOOD RIGHT HAND  Final   Special Requests NONE  Final   Culture   Final    NO GROWTH 5 DAYS Performed at [facility], [address] [place], [place] [address]    Report Status 12/27/2021 FINAL  Final  Culture, blood (routine x 2)     Status: None   Collection Time: 12/22/21 10:39 AM   Specimen: BLOOD  Result Value Ref Range Status   Specimen Description BLOOD BLOOD LEFT HAND  Final   Special Requests NONE  Final   Culture   Final    NO GROWTH 5 DAYS Performed at [facility], [address], [place], [place] [address]    Report Status 12/27/2021 FINAL  Final  MRSA Next Gen by PCR, Nasal     Status: None   Collection Time: 12/22/21 12:53 PM   Specimen: Nasal Mucosa; Nasal Swab  Result Value Ref Range Status   MRSA by PCR Next Gen NOT DETECTED NOT DETECTED Final    Comment: (NOTE) The GeneXpert MRSA Assay (FDA approved for NASAL specimens only), is one component of a comprehensive MRSA colonization surveillance program. It is not intended to diagnose MRSA infection nor to guide or monitor treatment for MRSA infections. Test performance is not FDA approved in patients less than 64 years old. Performed  at [facility], [address], [place], [place] [address]     Labs: CBC: Recent Labs  Lab 12/22/21 0920 12/23/21 0600 12/24/21 0300  WBC 7.1 9.2 8.4  NEUTROABS 5.7  --   --   HGB 14.7 12.1* 12.2*  HCT 44.1 36.6* 37.1*  MCV 96.3 95.3 95.1  PLT 154 200 035   Basic Metabolic Panel: Recent Labs  Lab 12/23/21 0600 12/24/21 0300 12/25/21 0410 12/26/21 0316 12/27/21 0600  NA 137 136 134* 131* 130*  K 4.1 3.3* 3.1* 3.2* 3.9  CL 100 95* 94* 91* 90*  CO2 '27 30 30 30 '$ 33*  GLUCOSE 85 74 95 89 100*  BUN 37* 31* '23 20 20  '$ CREATININE 1.20 1.12 0.90 0.72 0.75  CALCIUM 8.2* 8.1* 8.0* 7.9* 8.1*  MG 2.2  --  1.7 2.1  --    Liver Function Tests: Recent Labs  Lab 12/22/21 1302  AST 29  ALT 26  ALKPHOS 130*  BILITOT 2.1*  PROT 6.1*  ALBUMIN 2.7*   CBG: Recent Labs  Lab 12/22/21 1929 12/22/21 1957 12/22/21 2313 12/23/21 0305 12/23/21 0613  GLUCAP 62* 103* 84 73 82    Discharge time spent: greater than 30 minutes.  Signed: Orson Eva, MD [facility] 12/27/2021

## 2021-12-27 NOTE — TOC Transition Note (Signed)
Transition of Care [facility]) - CM/SW Discharge Note   Patient Details  Name: Danny [name]. MRN: [id number] Date of Birth: 05-13-1931  Transition of Care [facility]) CM/SW Contact:  Ihor Gully, LCSW Phone Number: 12/27/2021, 1:54 PM   Clinical Narrative:    Patient discharging, [name] with [name] advised of d/c. Hospice nurse to see patient tomorrow.    Final next level of care: Home w Hospice Care Barriers to Discharge: No Barriers Identified   Patient Goals and CMS Choice        Discharge Placement                       Discharge Plan and Services                                     Social Determinants of Health (SDOH) Interventions     Readmission Risk Interventions    04/30/2021    2:35 PM  Readmission Risk Prevention Plan  Post Dischage Appt Complete  Medication Screening Complete  Transportation Screening Complete

## 2021-12-27 NOTE — Progress Notes (Signed)
Pt discharged home, staff took pt down to main entrance via w/c to vehicle accompanied by pts niece. Discharge teaching given to pt and pts niece.

## 2021-12-28 ENCOUNTER — Telehealth: Payer: Self-pay | Admitting: Family Medicine

## 2021-12-28 NOTE — Telephone Encounter (Signed)
Pt just discharged from hospital, he is now on [facility] care. Danny [name] [facility] will not resume care. Instructed [name] to keep both pt & wife's appt's for Thursday since they are already scheduled & pt will need a HFU appt.

## 2021-12-29 ENCOUNTER — Ambulatory Visit (INDEPENDENT_AMBULATORY_CARE_PROVIDER_SITE_OTHER): Payer: Medicare HMO

## 2021-12-29 DIAGNOSIS — L03115 Cellulitis of right lower limb: Secondary | ICD-10-CM | POA: Diagnosis not present

## 2021-12-29 DIAGNOSIS — S91102D Unspecified open wound of left great toe without damage to nail, subsequent encounter: Secondary | ICD-10-CM | POA: Diagnosis not present

## 2021-12-29 DIAGNOSIS — L97819 Non-pressure chronic ulcer of other part of right lower leg with unspecified severity: Secondary | ICD-10-CM | POA: Diagnosis not present

## 2021-12-29 DIAGNOSIS — I110 Hypertensive heart disease with heart failure: Secondary | ICD-10-CM

## 2021-12-29 DIAGNOSIS — S91101D Unspecified open wound of right great toe without damage to nail, subsequent encounter: Secondary | ICD-10-CM

## 2021-12-29 DIAGNOSIS — I2510 Atherosclerotic heart disease of native coronary artery without angina pectoris: Secondary | ICD-10-CM

## 2021-12-29 DIAGNOSIS — I872 Venous insufficiency (chronic) (peripheral): Secondary | ICD-10-CM

## 2021-12-29 DIAGNOSIS — K219 Gastro-esophageal reflux disease without esophagitis: Secondary | ICD-10-CM

## 2021-12-29 DIAGNOSIS — I4891 Unspecified atrial fibrillation: Secondary | ICD-10-CM

## 2021-12-29 DIAGNOSIS — I5023 Acute on chronic systolic (congestive) heart failure: Secondary | ICD-10-CM | POA: Diagnosis not present

## 2021-12-29 DIAGNOSIS — F0394 Unspecified dementia, unspecified severity, with anxiety: Secondary | ICD-10-CM | POA: Diagnosis not present

## 2021-12-29 DIAGNOSIS — L97319 Non-pressure chronic ulcer of right ankle with unspecified severity: Secondary | ICD-10-CM

## 2021-12-29 DIAGNOSIS — E785 Hyperlipidemia, unspecified: Secondary | ICD-10-CM

## 2021-12-29 DIAGNOSIS — I6529 Occlusion and stenosis of unspecified carotid artery: Secondary | ICD-10-CM

## 2021-12-29 DIAGNOSIS — E559 Vitamin D deficiency, unspecified: Secondary | ICD-10-CM

## 2021-12-30 ENCOUNTER — Ambulatory Visit: Payer: Medicare HMO | Admitting: Family Medicine

## 2021-12-30 ENCOUNTER — Other Ambulatory Visit: Payer: Self-pay | Admitting: *Deleted

## 2021-12-30 DIAGNOSIS — R600 Localized edema: Secondary | ICD-10-CM

## 2021-12-30 DIAGNOSIS — L97909 Non-pressure chronic ulcer of unspecified part of unspecified lower leg with unspecified severity: Secondary | ICD-10-CM

## 2022-01-05 ENCOUNTER — Encounter (HOSPITAL_COMMUNITY): Payer: Medicare HMO

## 2022-01-12 ENCOUNTER — Ambulatory Visit: Payer: Medicare HMO | Admitting: Urology

## 2022-01-24 DEATH — deceased

## 2022-01-30 NOTE — Progress Notes (Deleted)
Cardiology Office Note   Date:  01/30/2022   ID:  Danny Loron., DOB Sep 02, 1931, MRN [id number]  PCP:  Danny Gouty, FNP  Cardiologist:   Danny Breeding, MD   No chief complaint on file.     History of Present Illness: Danny Malvin. is a 89 y.o. male who presents for follow up of CAD status post CABG in 2003 with LIMA-LAD, RIMA-RI, SVG-OM3.  He persistent atrial fib.  He has had severe RV dysfunction with severe TR.  He has moderate AS and mildly reduced LVEF.   At the last visit I stopped statin secondary to leg weakness. ***   *** He is switching his care because he lives up.  His wife is my patient.  He has been in fibrillation but deemed not to be an anticoagulation candidate because of fall risk.  He has been on amiodarone only for rate control.  He has not wanted further cardioversion.  He has known significant valve disease as mentioned and this is being managed conservatively.  He has some dementia and is a DNR.  He was actually hospitalized with delirium in January.  His wife takes care of him.  Actually yesterday he drove her to the store and on the way back he was so weak that he had to sit on the stairs coming back in and EMS had to be called to help prevent.  He did not lose consciousness.  She has had progressive leg weakness.  He has lower extremity swelling which she thinks is a little bit more than previous.  She feeds him to manage his because of his low sodium.  He is not having any new shortness of breath, PND or orthopnea.  He does notice his atrial fibrillation but he is not having any palpitations, presyncope or syncope.  He denies any chest pain.  He does seem to have some memory issues.  He scoots around his house in his wheelchair.   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 01/14/2014   Vitamin D deficiency     Past Surgical History:  Procedure Laterality Date   ABDOMINAL AORTOGRAM W/LOWER EXTREMITY N/A 12/13/2021   Procedure: ABDOMINAL AORTOGRAM W/LOWER EXTREMITY;  Surgeon: Danny Sandy, MD;  Location: [facility];  Service: Cardiovascular;  Laterality: N/A;   CARDIOVERSION N/A 05/05/2016   Procedure: CARDIOVERSION;  Surgeon: Danny Hector, MD;  Location: [facility] ENDOSCOPY;  Service: Cardiovascular;  Laterality: N/A;   CAROTID ENDARTERECTOMY Left 2003   CAROTID-SUBCLAVIAN BYPASS GRAFT Right    COLONOSCOPY  2005   hyperplastic polyp x 1 with no adenoma   COLONOSCOPY N/A 04/11/2014   [id number] diverticulosis in the sigmoid colon/left colon is redundant   CORONARY ARTERY BYPASS GRAFT     ESOPHAGEAL DILATION N/A 04/11/2014   Procedure: ESOPHAGEAL DILATION;  Surgeon: Danny Binder, MD;  [facility];  Service: Endoscopy;  Laterality: N/A;   ESOPHAGOGASTRODUODENOSCOPY N/A 04/11/2014   [id number] at the gastro junction/moderate erosive/duodenal web   EYE SURGERY Bilateral  cataracts   PERIPHERAL VASCULAR INTERVENTION  12/13/2021   Procedure: PERIPHERAL VASCULAR INTERVENTION;  Surgeon: Danny Sandy, MD;  Location: [place];  Service: Cardiovascular;;   RIGHT/LEFT HEART CATH AND CORONARY/GRAFT ANGIOGRAPHY N/A 02/11/2020   Procedure: RIGHT/LEFT HEART CATH AND CORONARY/GRAFT ANGIOGRAPHY;  Surgeon: Martinique, Danny M, MD;  Location: [place];  Service: Cardiovascular;  Laterality: N/A;   SKIN CANCER EXCISION       Current Outpatient Medications  Medication Sig Dispense Refill   acetaminophen (TYLENOL) 325 MG tablet Take 650 mg by mouth every 6 (six) hours as needed.     aspirin 81 MG chewable tablet Chew 81  mg by mouth daily. atrial fibrillation     cephALEXin (KEFLEX) 500 MG capsule Take 1 capsule (500 mg total) by mouth 4 (four) times daily. 12 capsule 0   cholecalciferol (VITAMIN D) 25 MCG (1000 UNIT) tablet Take 1,000 Units by mouth daily.     furosemide (LASIX) 40 MG tablet Take 1 tablet (40 mg total) by mouth daily. 30 tablet 1   levocetirizine (XYZAL) 5 MG tablet TAKE 1 TABLET EVERY EVENING 90 tablet 1   metoprolol succinate (TOPROL-XL) 25 MG 24 hr tablet Take 1 tablet (25 mg total) by mouth daily. 90 tablet 2   omeprazole (PRILOSEC) 20 MG capsule Take 1 capsule (20 mg total) by mouth 2 (two) times daily before a meal. 180 capsule 3   potassium chloride SA (KLOR-CON [name]) 20 MEQ tablet Take 2 tablets (40 mEq total) by mouth daily. 180 tablet 1   No current facility-administered medications for this visit.    Allergies:   Doxycycline, Contrast media [iodinated contrast media], Lunesta [eszopiclone], Morphine and related, and Trazodone and nefazodone     ROS:  Please see the history of present illness.   Otherwise, review of systems are positive for ***.   All other systems are reviewed and negative.    PHYSICAL EXAM: VS:  There were no vitals taken for this visit. , BMI There is no height or weight on file to calculate BMI. GENERAL:  Well appearing NECK:  No jugular venous distention, waveform within normal limits, carotid upstroke brisk and symmetric, no bruits, no thyromegaly LUNGS:  Clear to auscultation bilaterally CHEST:  Unremarkable HEART:  PMI not displaced or sustained,S1 and S2 within normal limits, no S3, no clicks, no rubs, *** murmurs, irregular  ABD:  Flat, positive bowel sounds normal in frequency in pitch, no bruits, no rebound, no guarding, no midline pulsatile mass, no hepatomegaly, no splenomegaly EXT:  2 plus pulses throughout, no edema, no cyanosis no clubbing     ***GENERAL:  Well appearing HEENT:  Pupils equal round and reactive, fundi not visualized, oral  mucosa unremarkable NECK:  Positive jugular venous distention with CV wave, waveform within normal limits, carotid upstroke brisk and symmetric, no bruits, no thyromegaly LYMPHATICS:  No cervical, inguinal adenopathy LUNGS:  Clear to auscultation bilaterally BACK:  No CVA tenderness CHEST:  Well healed sternotomy scar. HEART:  PMI not displaced or sustained,S1 and S2 within normal limits, no S3,  no clicks, no rubs, holosystolic systolic murmur heard at the third left intercostal space murmurs, irregular ABD:  Flat, positive bowel sounds normal in frequency in pitch, no bruits, no rebound, no guarding, no midline pulsatile mass, no hepatomegaly, no splenomegaly EXT:  2 plus pulses throughout, severe bilateral edema, no cyanosis no clubbing SKIN:  No rashes no nodules NEURO:  Cranial nerves II through XII grossly intact, motor grossly  intact throughout [facility]:  Cognitively intact, oriented to person place and time  Cath 02/11/2020  Diagnostic Dominance: Right     EKG:  EKG is *** ordered today. The ekg ordered today demonstrates atrial fibrillation, rate ***, axis within normal limits, intervals within normal limits, poor anterior R wave progression, premature ventricular contractions.   Recent Labs: 06/29/2021: TSH 1.720 12/22/2021: ALT 26; B Natriuretic Peptide 1,048.0 12/24/2021: Hemoglobin 12.2; Platelets 200 12/26/2021: Magnesium 2.1 12/27/2021: BUN 20; Creatinine, Ser 0.75; Potassium 3.9; Sodium 130    Lipid Panel    Component Value Date/Time   CHOL 122 06/29/2021 1056   CHOL 116 05/25/2012 0832   TRIG 39 06/29/2021 1056   TRIG 59 10/29/2015 1133   TRIG 53 05/25/2012 0832   HDL 56 06/29/2021 1056   HDL 79 10/29/2015 1133   HDL 63 05/25/2012 0832   CHOLHDL 2.2 06/29/2021 1056   LDLCALC 56 06/29/2021 1056   LDLCALC 109 (H) 09/12/2013 0811   LDLCALC 42 05/25/2012 0832      Wt Readings from Last 3 Encounters:  12/27/21 168 lb 10.4 oz (76.5 kg)  12/13/21 176 lb (79.8 kg)   12/09/21 180 lb (81.6 kg)      Other studies Reviewed: Additional studies/ records that were reviewed today include: *** Review of the above records demonstrates:  Please see elsewhere in the note.     ASSESSMENT AND PLAN:    Permanent atrial fib: *** He seems to have good rate control.  He is not an anticoagulation candidate.  No change in therapy.  Hypertension: The blood pressure is ***controlled.  I will be making med changes as below.  CAD: He has coronary artery disease with his last catheterization 2022.  *** Medical management.  Disease as listed above..  Aortic stenosis/tricuspid regurgitation: He has aortic stenosis.  ***  However, he is frail and not a candidate for intervention.  No change in therapy.  RV dysfunction: ***  He has some significant edema which I think is increased although it is hard to tell.  His weight seems to be up a little bit though this is a different scale.  I am just can give him Lasix for 4 days at 40 mg and recheck a basic metabolic profile in about a week.  I realize that his sodium was low but I want him to stop drinking tomato juice.  Dementia:   ***  He has a memory disorder.  He has limited help at home.  I will defer to his primary provider as to whether he qualifies for any assistance at home.  I wonder if his wife would be helped by Meals on Wheels.  Leg weakness: ***  I am going to his stop [employer].  I doubt that this is contributing but I would like him to be on fewer medicines.  DNR  Current medicines are reviewed at length with the patient today.  The patient does not have concerns regarding medicines.  The following changes have been made: ***  Labs/ tests ordered today include: ***  No orders of the defined types were placed in this encounter.    Disposition:   FU with me in about *** months   Signed, Danny Breeding, MD  01/30/2022 12:06 PM    [place]

## 2022-02-02 ENCOUNTER — Ambulatory Visit: Payer: Medicare HMO | Admitting: Cardiology

## 2022-02-02 DIAGNOSIS — I2510 Atherosclerotic heart disease of native coronary artery without angina pectoris: Secondary | ICD-10-CM

## 2022-02-02 DIAGNOSIS — I10 Essential (primary) hypertension: Secondary | ICD-10-CM

## 2022-02-02 DIAGNOSIS — I4821 Permanent atrial fibrillation: Secondary | ICD-10-CM

## 2023-03-31 IMAGING — DX DG CHEST 1V PORT
1 series · 1 of 1 positions shown · non-contrast
Comparison: Chest radiograph 02/05/2021

CLINICAL DATA: Intermittent shortness of breath, history of AFib,
CABG

EXAM:
PORTABLE CHEST 1 VIEW

[chest ap]
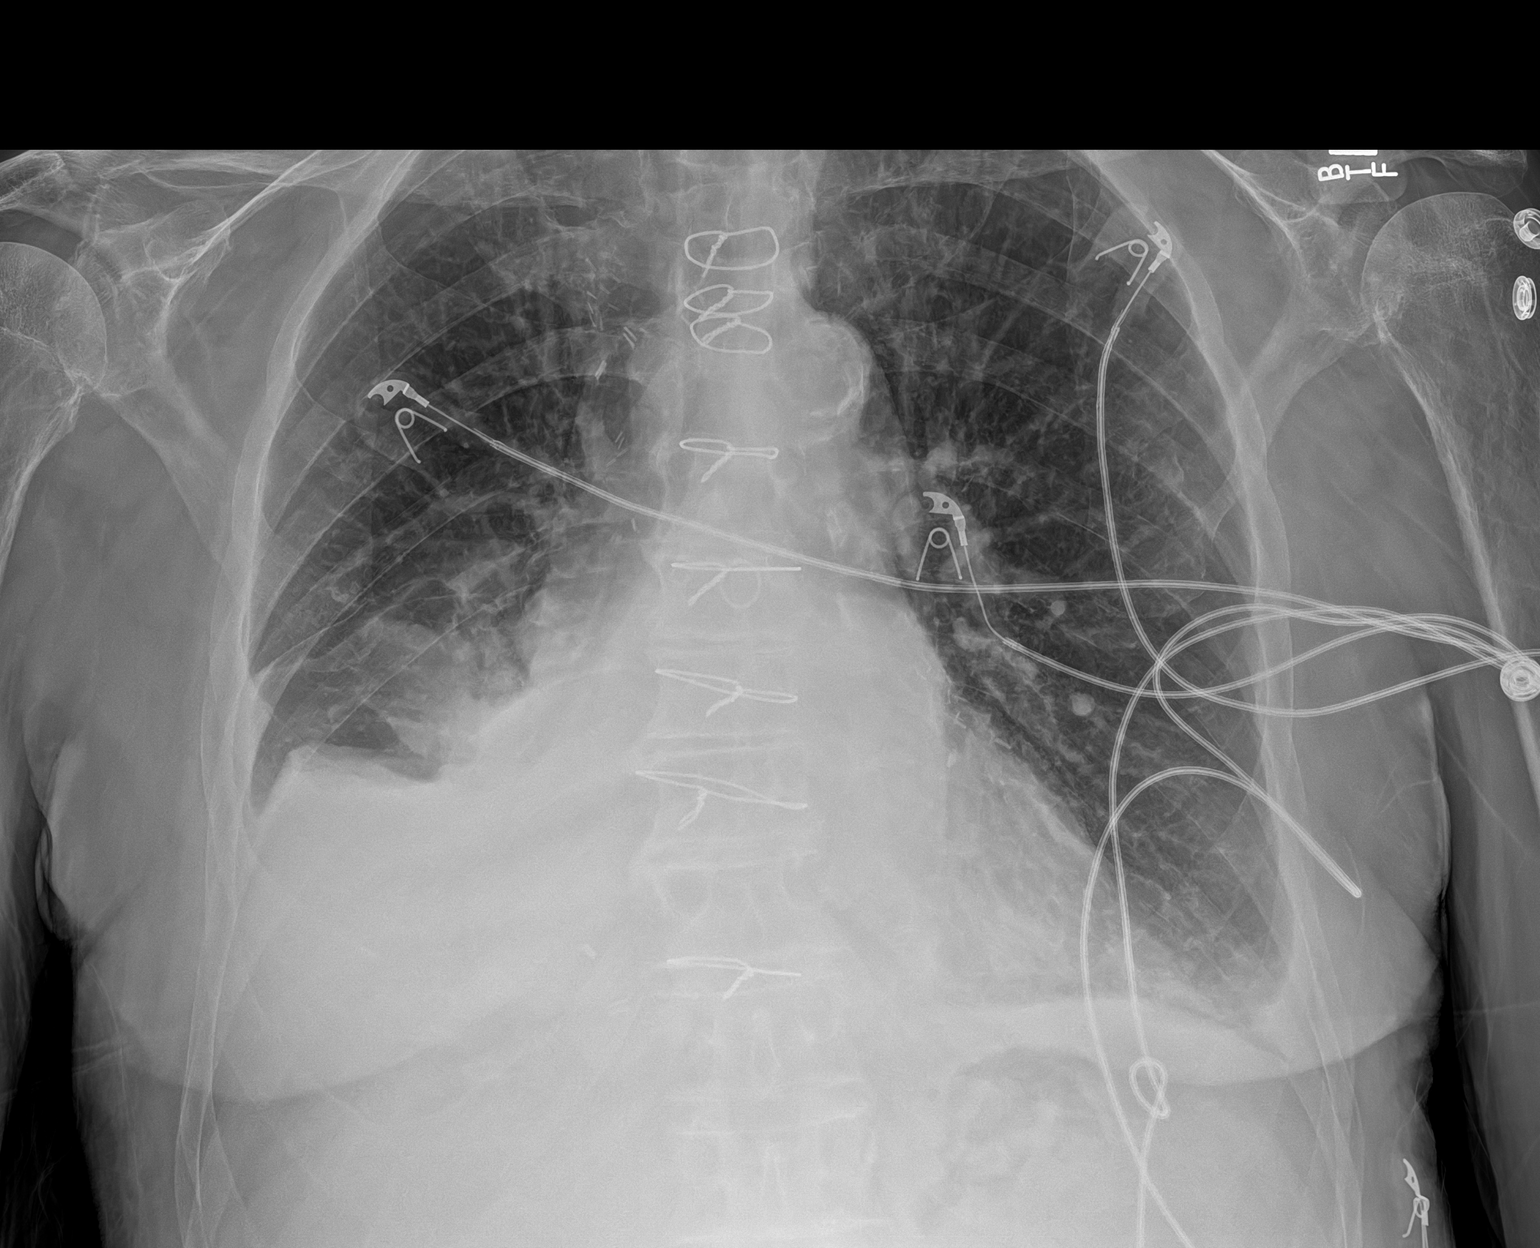

[1 of 1 positions shown; findings below may reference images not displayed]

FINDINGS: Median sternotomy wires and mediastinal surgical clips are again
noted. The cardiomediastinal silhouette is stable.

A small right pleural effusion is again seen with patchy airspace
opacity in the right lower lobe, overall slightly improved since
02/05/2021. A trace left pleural effusion is likely not
significantly changed. Otherwise, there is no new or worsening focal
airspace disease. There is vascular congestion without definite
overt pulmonary edema. There is no pneumothorax.

The bones are stable.
IMPRESSION: 1. Small right pleural effusion with patchy opacities in the right
lower lobe, slightly improved since 02/05/2021. Continued
radiographic follow up to resolution is recommended.
2. Trace left pleural effusion is likely not significantly changed.
3. No new or worsening focal airspace disease.
